# Patient Record
Sex: Female | Born: 1975 | Race: White | Hispanic: No | Marital: Married | Smoking: Former smoker
Health system: Southern US, Community
[De-identification: ages and names within clinical notes are randomized; demographics above are authoritative.]

## PROBLEM LIST (undated history)

## (undated) DIAGNOSIS — E0500 Thyrotoxicosis with diffuse goiter without thyrotoxic crisis or storm: Secondary | ICD-10-CM

## (undated) DIAGNOSIS — E7800 Pure hypercholesterolemia, unspecified: Secondary | ICD-10-CM

## (undated) DIAGNOSIS — E079 Disorder of thyroid, unspecified: Secondary | ICD-10-CM

## (undated) DIAGNOSIS — F329 Major depressive disorder, single episode, unspecified: Secondary | ICD-10-CM

## (undated) DIAGNOSIS — J189 Pneumonia, unspecified organism: Secondary | ICD-10-CM

## (undated) DIAGNOSIS — E559 Vitamin D deficiency, unspecified: Secondary | ICD-10-CM

## (undated) DIAGNOSIS — M1990 Unspecified osteoarthritis, unspecified site: Secondary | ICD-10-CM

## (undated) DIAGNOSIS — F419 Anxiety disorder, unspecified: Secondary | ICD-10-CM

## (undated) DIAGNOSIS — E119 Type 2 diabetes mellitus without complications: Secondary | ICD-10-CM

## (undated) DIAGNOSIS — R519 Headache, unspecified: Secondary | ICD-10-CM

## (undated) DIAGNOSIS — G4730 Sleep apnea, unspecified: Secondary | ICD-10-CM

## (undated) DIAGNOSIS — E039 Hypothyroidism, unspecified: Secondary | ICD-10-CM

## (undated) DIAGNOSIS — D259 Leiomyoma of uterus, unspecified: Secondary | ICD-10-CM

## (undated) DIAGNOSIS — K259 Gastric ulcer, unspecified as acute or chronic, without hemorrhage or perforation: Secondary | ICD-10-CM

## (undated) DIAGNOSIS — K219 Gastro-esophageal reflux disease without esophagitis: Secondary | ICD-10-CM

## (undated) DIAGNOSIS — J45909 Unspecified asthma, uncomplicated: Secondary | ICD-10-CM

## (undated) DIAGNOSIS — F32A Depression, unspecified: Secondary | ICD-10-CM

## (undated) DIAGNOSIS — K5900 Constipation, unspecified: Secondary | ICD-10-CM

## (undated) DIAGNOSIS — K760 Fatty (change of) liver, not elsewhere classified: Secondary | ICD-10-CM

## (undated) DIAGNOSIS — D649 Anemia, unspecified: Secondary | ICD-10-CM

## (undated) DIAGNOSIS — T7840XA Allergy, unspecified, initial encounter: Secondary | ICD-10-CM

## (undated) DIAGNOSIS — K5790 Diverticulosis of intestine, part unspecified, without perforation or abscess without bleeding: Secondary | ICD-10-CM

## (undated) DIAGNOSIS — K648 Other hemorrhoids: Secondary | ICD-10-CM

## (undated) DIAGNOSIS — R9431 Abnormal electrocardiogram [ECG] [EKG]: Secondary | ICD-10-CM

## (undated) HISTORY — DX: Pure hypercholesterolemia, unspecified: E78.00

## (undated) HISTORY — DX: Anxiety disorder, unspecified: F41.9

## (undated) HISTORY — PX: ABDOMINAL HYSTERECTOMY: SUR658

## (undated) HISTORY — PX: TUBAL LIGATION: SHX77

## (undated) HISTORY — DX: Abnormal electrocardiogram (ECG) (EKG): R94.31

## (undated) HISTORY — DX: Vitamin D deficiency, unspecified: E55.9

## (undated) HISTORY — DX: Gastric ulcer, unspecified as acute or chronic, without hemorrhage or perforation: K25.9

## (undated) HISTORY — DX: Allergy, unspecified, initial encounter: T78.40XA

## (undated) HISTORY — PX: ABDOMINAL HYSTERECTOMY: SHX81

## (undated) HISTORY — PX: MOUTH SURGERY: SHX715

## (undated) HISTORY — DX: Unspecified osteoarthritis, unspecified site: M19.90

## (undated) HISTORY — DX: Constipation, unspecified: K59.00

## (undated) HISTORY — PX: DILATION AND CURETTAGE OF UTERUS: SHX78

---

## 2003-03-14 ENCOUNTER — Emergency Department (HOSPITAL_COMMUNITY): Admission: EM | Admit: 2003-03-14 | Discharge: 2003-03-14 | Payer: Self-pay | Admitting: Emergency Medicine

## 2012-04-06 ENCOUNTER — Encounter (HOSPITAL_COMMUNITY): Payer: Self-pay | Admitting: Emergency Medicine

## 2012-04-06 ENCOUNTER — Emergency Department (HOSPITAL_COMMUNITY)
Admission: EM | Admit: 2012-04-06 | Discharge: 2012-04-06 | Disposition: A | Discharge status: Home / Self Care | Payer: BC Managed Care – PPO | Attending: Emergency Medicine | Admitting: Emergency Medicine

## 2012-04-06 ENCOUNTER — Emergency Department (HOSPITAL_COMMUNITY): Payer: BC Managed Care – PPO

## 2012-04-06 DIAGNOSIS — M25579 Pain in unspecified ankle and joints of unspecified foot: Secondary | ICD-10-CM | POA: Insufficient documentation

## 2012-04-06 DIAGNOSIS — M79672 Pain in left foot: Secondary | ICD-10-CM

## 2012-04-06 DIAGNOSIS — F32A Depression, unspecified: Secondary | ICD-10-CM | POA: Insufficient documentation

## 2012-04-06 DIAGNOSIS — F3289 Other specified depressive episodes: Secondary | ICD-10-CM | POA: Insufficient documentation

## 2012-04-06 DIAGNOSIS — F329 Major depressive disorder, single episode, unspecified: Secondary | ICD-10-CM | POA: Insufficient documentation

## 2012-04-06 HISTORY — DX: Depression, unspecified: F32.A

## 2012-04-06 HISTORY — DX: Major depressive disorder, single episode, unspecified: F32.9

## 2012-04-06 MED ORDER — TRAMADOL-ACETAMINOPHEN 37.5-325 MG PO TABS: ORAL_TABLET | ORAL | Status: AC

## 2012-04-06 MED ORDER — IBUPROFEN 400 MG PO TABS
800.0000 mg | ORAL_TABLET | Freq: Once | ORAL | Status: AC
  Administered 2012-04-06: 800 mg via ORAL
  Filled 2012-04-06: qty 2

## 2012-04-06 NOTE — ED Notes (Signed)
Pt states she was walking down the hall at her house when she stepped wrong and felt a snap/pop on the top part of her right foot.  Sensation, motor intact.  Pt states that her foot feels "tight" when she tries to stretch it.  Pt states in area where foot "popped" last night, she noticed some bruising in area.  Pt's foot has not noticeable bruising or swelling.

## 2012-04-06 NOTE — ED Notes (Signed)
Ortho tech paged  

## 2012-04-06 NOTE — Progress Notes (Signed)
Orthopedic Tech Progress Note Patient Details:  Heather Fry 1975/10/07 [id number]  Ortho Devices Type of Ortho Device: Postop boot Ortho Device/Splint Location: (R) LE Ortho Device/Splint Interventions: Application   Jennye Moccasin 04/06/2012, 3:31 PM

## 2012-04-06 NOTE — ED Notes (Signed)
Pt c/o right foot pain after stepping wrong yesterday; CMS intact

## 2012-04-06 NOTE — ED Provider Notes (Signed)
History   This chart was scribed for Ward Givens, MD by Charolett Bumpers . The patient was seen in room TR10C/TR10C.    CSN: [id number]  Arrival date & time 04/06/12  1241   First MD Initiated Contact with Patient 04/06/12 1500      Chief Complaint  Patient presents with  . Foot Pain    (Consider location/radiation/quality/duration/timing/severity/associated sxs/prior treatment) HPI Heather Fry is a 36 y.o. female who presents to the Emergency Department complaining of constant, moderate right foot pain since last night. Patient states that she was walking across floor, when her foot "snapped" and indicates the MTP of her right great toe radiating up the medial aspect of her foot. Patient states that her toes feel "tight". Patient reports that she has heard the joints in her foot "pop" frequently, but never this severe before. Patient states that her symptoms are aggravated with movement and weight bearing. Patient reports a medical h/o depression and early menopause.  PCP: Dr Kristian Covey in [place]  Past Medical History  Diagnosis Date  . Depression     History reviewed. No pertinent past surgical history.  History reviewed. No pertinent family history.  History  Substance Use Topics  . Smoking status: Never Smoker   . Smokeless tobacco: Not on file  . Alcohol Use: No   unemployed homemaker  OB History    Grav Para Term Preterm Abortions TAB SAB Ect Mult Living                  Review of Systems  Musculoskeletal: Positive for arthralgias.    Allergies  Penicillins  Home Medications   Current Outpatient Rx  Name Route Sig Dispense Refill  . TYLENOL ARTHRITIS PAIN PO Oral Take 2 tablets by mouth 2 (two) times daily as needed. For pain    . CETIRIZINE HCL 10 MG PO TABS Oral Take 10 mg by mouth daily.    Marland Kitchen TAGAMET PO Oral Take 1 tablet by mouth 2 (two) times daily as needed. For acid reflux    . ESCITALOPRAM OXALATE 10 MG PO TABS Oral Take 10 mg by  mouth at bedtime.    Marland Kitchen FIBER PO CAPS Oral Take 2 capsules by mouth daily.    . ADULT MULTIVITAMIN W/MINERALS CH Oral Take 1 tablet by mouth daily.      BP 134/88  Pulse 83  Temp 99.7 F (37.6 C) (Oral)  Resp 14  SpO2 100%  LMP 03/16/2012  Vital signs normal    Physical Exam  Nursing note and vitals reviewed. Constitutional: She is oriented to person, place, and time. She appears well-developed and well-nourished. No distress.  HENT:  Head: Normocephalic and atraumatic.  Right Ear: External ear normal.  Left Ear: External ear normal.  Eyes: Conjunctivae and EOM are normal. Pupils are equal, round, and reactive to light.  Neck: Normal range of motion. Neck supple. No tracheal deviation present.  Cardiovascular:       Intact distal pulses.   Pulmonary/Chest: Effort normal. No respiratory distress.  Musculoskeletal: Normal range of motion.       No swelling of right foot. Diffuse tenderness without localization of pain. Good distal pulses. Neurovascularly intact.   Neurological: She is alert and oriented to person, place, and time.  Skin: Skin is warm and dry.       Good skin color.   Psychiatric: She has a normal mood and affect. Her behavior is normal.    ED Course  Procedures (  including critical care time)   Medications  ibuprofen (ADVIL,MOTRIN) tablet 800 mg (not administered)   Pt relates she has a lot of pain/swelling in her left knee and has shin splints. She lives in [place], given referral to [facility] in [place].  Pt placed in post-op shoe  DIAGNOSTIC STUDIES: Oxygen Saturation is 100% on room air, normal by my interpretation.    COORDINATION OF CARE:   1508: Discussed planned course of treatment with the patient, who is agreeable at this time. Will place patient in post op boot and discussed elevation of foot and ice.    Labs Reviewed - No data to display Dg Foot Complete Right  04/06/2012  *RADIOLOGY REPORT*  Clinical Data: Fall, metatarsal pain  RIGHT FOOT  COMPLETE - 3+ VIEW  Comparison: None.  Findings: Normal alignment without fracture.  Preserved joint spaces.  No significant arthropathy.  No radiographic swelling or foreign body.  IMPRESSION: No acute finding.  Original Report Authenticated By: Judie Petit. TREVOR Miles Costain, M.D.     1. Foot pain, left    New Prescriptions   TRAMADOL-ACETAMINOPHEN (ULTRACET) 37.5-325 MG PER TABLET    2 tabs po QID prn pain  Ibuprofen   Plan discharge  Devoria Albe, MD, FACEP    MDM     I personally performed the services described in this documentation, which was scribed in my presence. The recorded information has been reviewed and considered.  Devoria Albe, MD, Armando Gang       Ward Givens, MD 04/06/12 1534

## 2012-12-30 ENCOUNTER — Encounter: Payer: Self-pay | Admitting: Obstetrics & Gynecology

## 2013-01-13 ENCOUNTER — Ambulatory Visit (INDEPENDENT_AMBULATORY_CARE_PROVIDER_SITE_OTHER): Payer: BC Managed Care – PPO | Admitting: Obstetrics & Gynecology

## 2013-01-13 ENCOUNTER — Encounter: Payer: Self-pay | Admitting: Obstetrics & Gynecology

## 2013-01-13 VITALS — BP 130/90 | Ht 64.0 in | Wt 217.0 lb

## 2013-01-13 DIAGNOSIS — N920 Excessive and frequent menstruation with regular cycle: Secondary | ICD-10-CM | POA: Insufficient documentation

## 2013-01-13 DIAGNOSIS — N946 Dysmenorrhea, unspecified: Secondary | ICD-10-CM

## 2013-01-19 NOTE — Progress Notes (Signed)
Patient ID: Heather Fry, female   DOB: Sep 07, 1976, 37 y.o.   MRN: [id number] Patient presents with 2-3 year history of menstrual complaints, heavier crampier periods. Also with significant complaints of anxiety, OCD and emotional lability. Husband verifies.  By history not associated with menstrual cycle. On lexapro and wondering if it is the right way to go  I told patient she needs to stay on the lexapro and recommend evaluation and treatment by a psychiatrist for mood lability and OCD  Pt and husband agree and I will investigate available resources.

## 2013-01-20 ENCOUNTER — Telehealth: Payer: Self-pay | Admitting: Obstetrics & Gynecology

## 2013-01-20 ENCOUNTER — Telehealth: Payer: Self-pay | Admitting: *Deleted

## 2013-01-20 NOTE — Telephone Encounter (Signed)
Front staff to call and make an appt for the patient to be seen by provider.

## 2013-02-28 NOTE — Telephone Encounter (Signed)
See telephone message

## 2013-10-14 ENCOUNTER — Other Ambulatory Visit (HOSPITAL_COMMUNITY): Payer: Self-pay | Admitting: Family Medicine

## 2013-10-14 ENCOUNTER — Ambulatory Visit (HOSPITAL_COMMUNITY)
Admission: RE | Admit: 2013-10-14 | Discharge: 2013-10-14 | Disposition: A | Discharge status: Home / Self Care | Payer: 59 | Source: Ambulatory Visit | Attending: Family Medicine | Admitting: Family Medicine

## 2013-10-14 DIAGNOSIS — R079 Chest pain, unspecified: Secondary | ICD-10-CM

## 2013-10-14 DIAGNOSIS — M25519 Pain in unspecified shoulder: Secondary | ICD-10-CM | POA: Insufficient documentation

## 2013-10-14 IMAGING — CR DG CHEST 2V
2 series · 2 of 2 positions shown · non-contrast
Comparison: [DATE]

CLINICAL DATA: Left chest and shoulder pain

EXAM:
CHEST  2 VIEW

[view not recorded (1 of 2)]
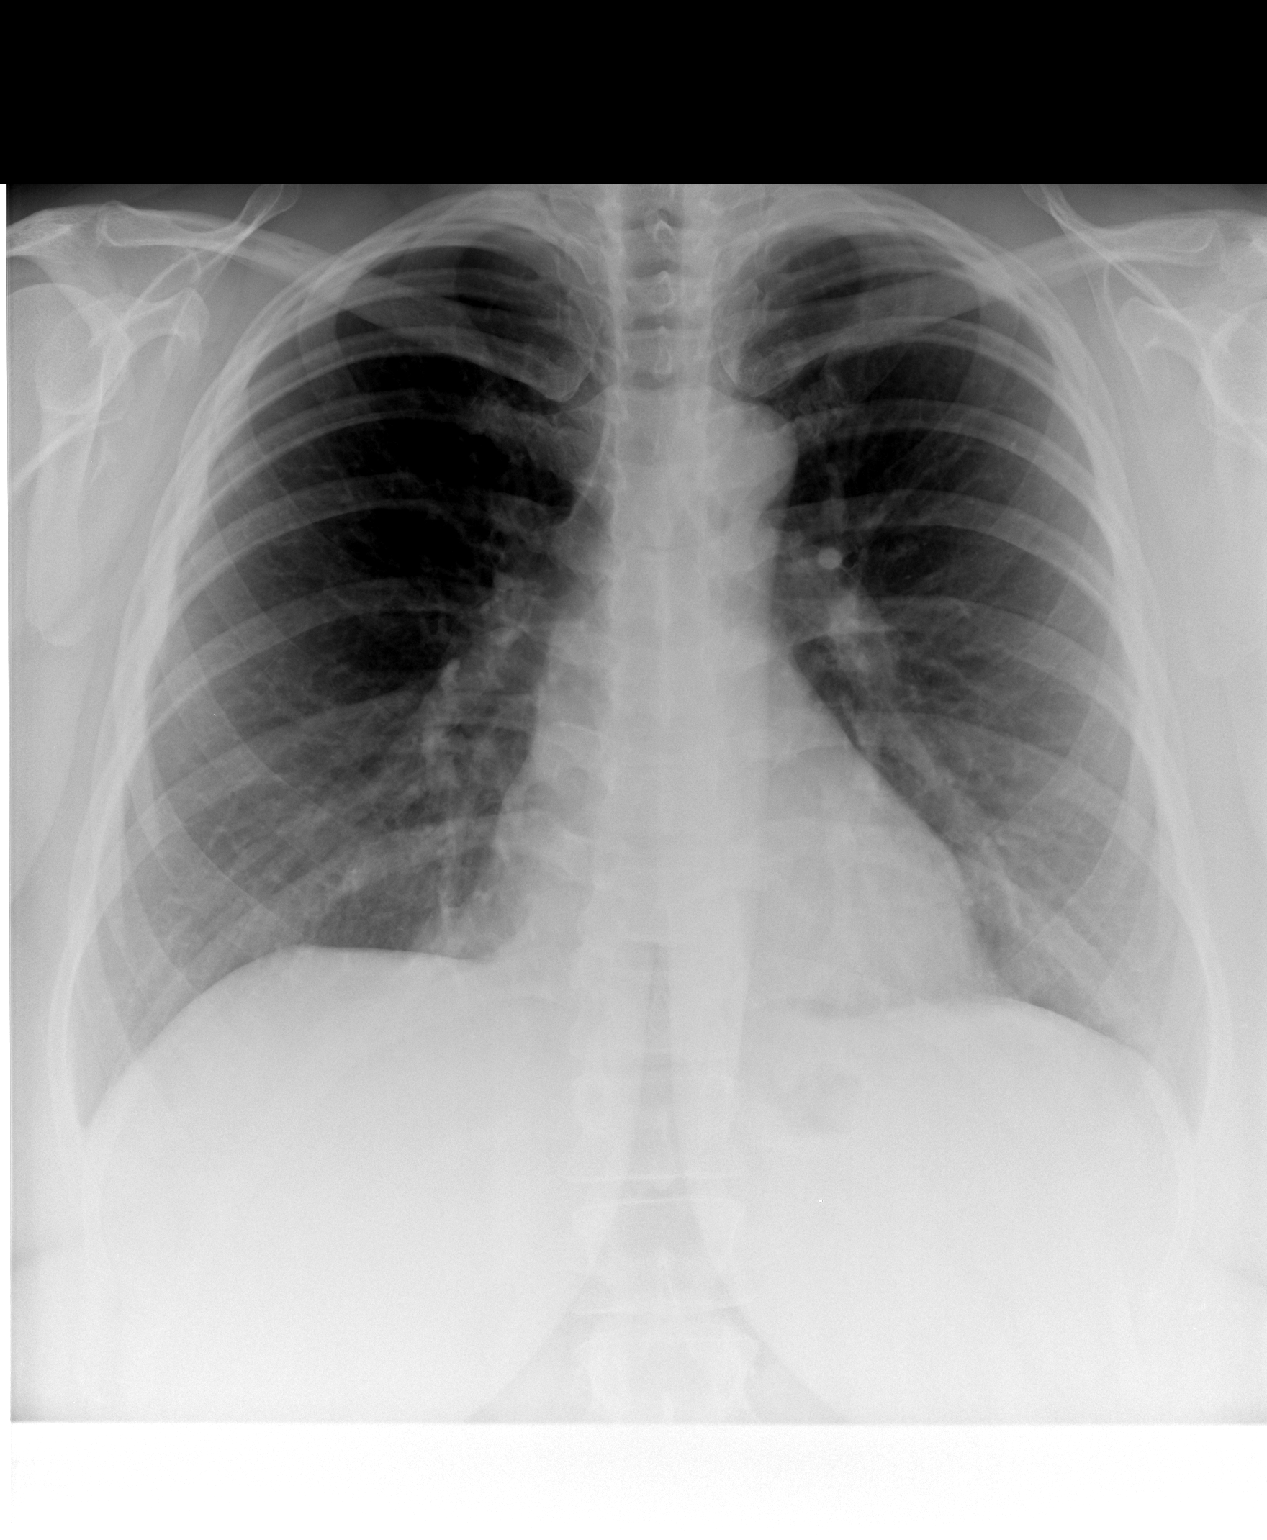

[view not recorded (2 of 2)]
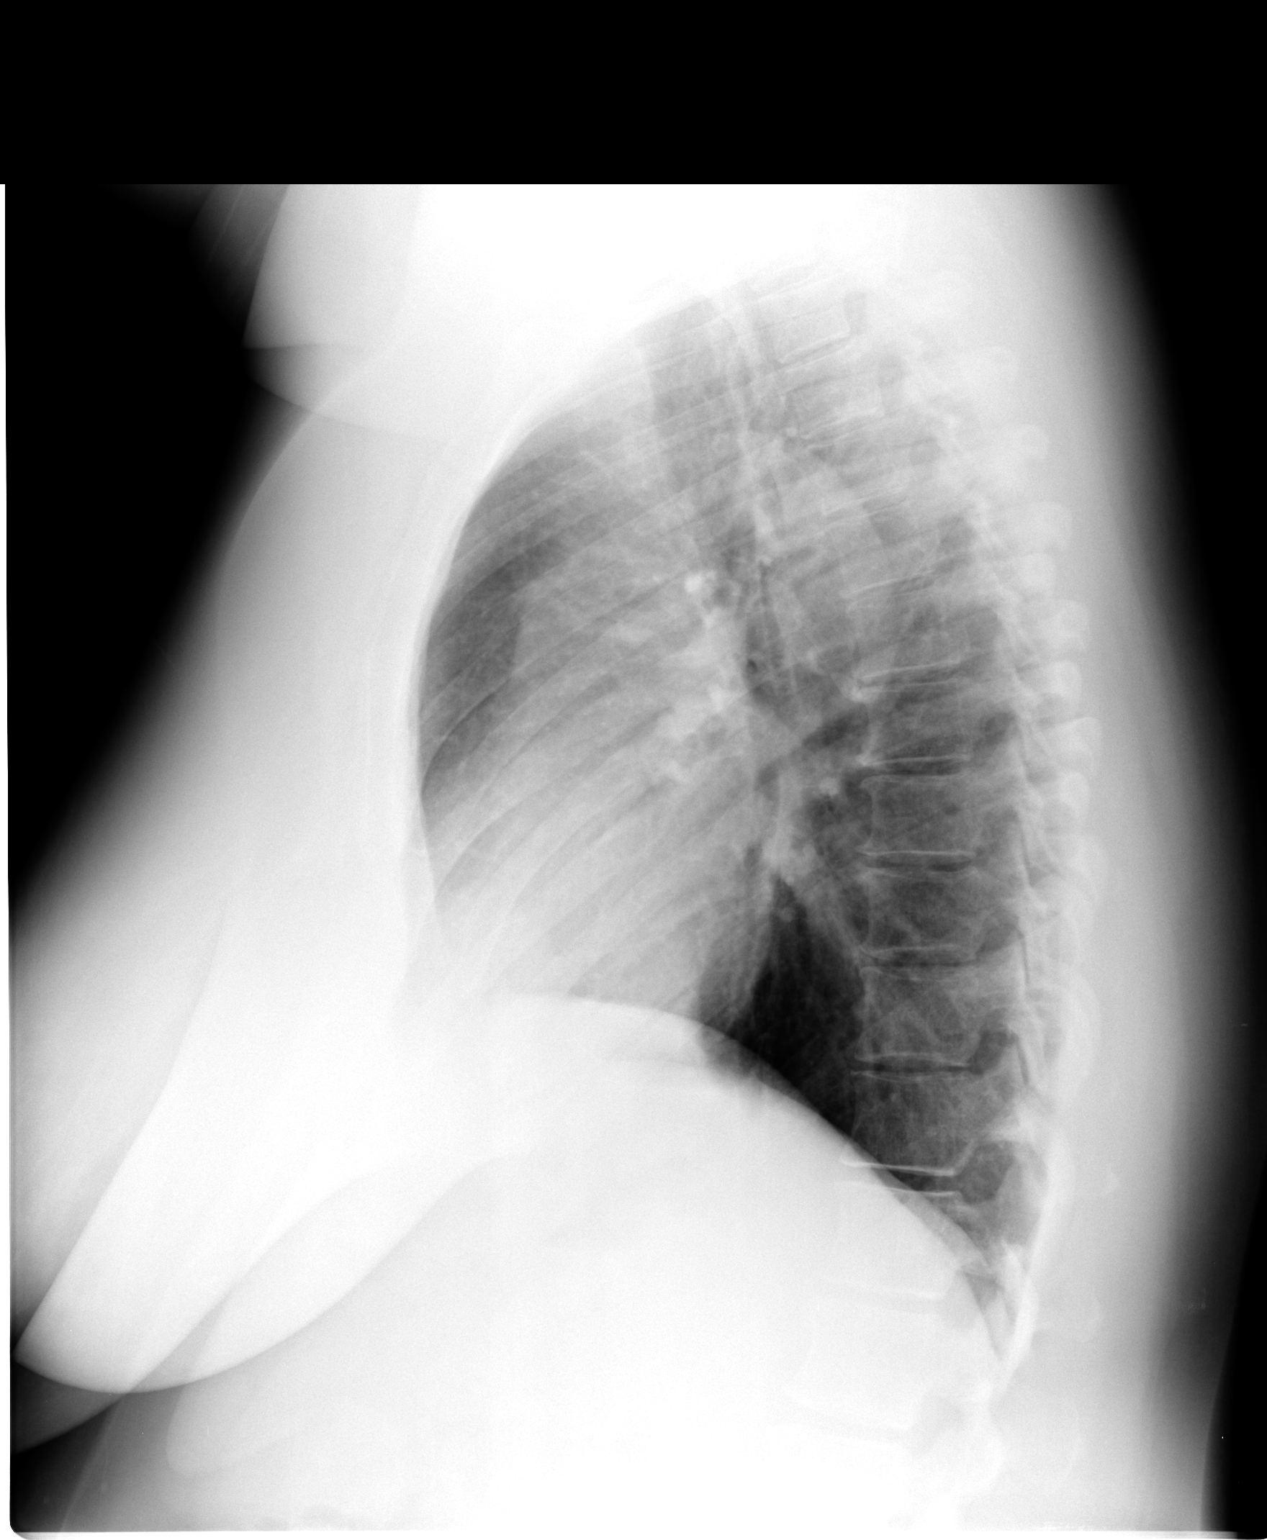

[2 of 2 positions shown; findings below may reference images not displayed]

FINDINGS: Normal heart size, mediastinal contours, and pulmonary vascularity.

Lungs clear.

Bones unremarkable.

No pneumothorax.
IMPRESSION: Normal exam.

## 2014-08-10 ENCOUNTER — Emergency Department (HOSPITAL_COMMUNITY)
Admission: EM | Admit: 2014-08-10 | Discharge: 2014-08-10 | Disposition: A | Discharge status: Home / Self Care | Payer: 59 | Attending: Emergency Medicine | Admitting: Emergency Medicine

## 2014-08-10 ENCOUNTER — Encounter (HOSPITAL_COMMUNITY): Payer: Self-pay | Admitting: *Deleted

## 2014-08-10 DIAGNOSIS — Z8742 Personal history of other diseases of the female genital tract: Secondary | ICD-10-CM | POA: Insufficient documentation

## 2014-08-10 DIAGNOSIS — Z79899 Other long term (current) drug therapy: Secondary | ICD-10-CM | POA: Insufficient documentation

## 2014-08-10 DIAGNOSIS — Z3202 Encounter for pregnancy test, result negative: Secondary | ICD-10-CM | POA: Insufficient documentation

## 2014-08-10 DIAGNOSIS — Z791 Long term (current) use of non-steroidal anti-inflammatories (NSAID): Secondary | ICD-10-CM | POA: Diagnosis not present

## 2014-08-10 DIAGNOSIS — N390 Urinary tract infection, site not specified: Secondary | ICD-10-CM | POA: Diagnosis not present

## 2014-08-10 DIAGNOSIS — K219 Gastro-esophageal reflux disease without esophagitis: Secondary | ICD-10-CM | POA: Insufficient documentation

## 2014-08-10 DIAGNOSIS — Z880 Allergy status to penicillin: Secondary | ICD-10-CM | POA: Diagnosis not present

## 2014-08-10 DIAGNOSIS — R1011 Right upper quadrant pain: Secondary | ICD-10-CM | POA: Diagnosis present

## 2014-08-10 DIAGNOSIS — F329 Major depressive disorder, single episode, unspecified: Secondary | ICD-10-CM | POA: Insufficient documentation

## 2014-08-10 DIAGNOSIS — F419 Anxiety disorder, unspecified: Secondary | ICD-10-CM | POA: Insufficient documentation

## 2014-08-10 HISTORY — DX: Leiomyoma of uterus, unspecified: D25.9

## 2014-08-10 LAB — PREGNANCY, URINE: Preg Test, Ur: NEGATIVE

## 2014-08-10 LAB — COMPREHENSIVE METABOLIC PANEL
ALK PHOS: 51 U/L (ref 39–117)
ALT: 16 U/L (ref 0–35)
ANION GAP: 11 (ref 5–15)
AST: 15 U/L (ref 0–37)
Albumin: 3.7 g/dL (ref 3.5–5.2)
BUN: 10 mg/dL (ref 6–23)
CALCIUM: 9.9 mg/dL (ref 8.4–10.5)
CO2: 26 meq/L (ref 19–32)
Chloride: 100 mEq/L (ref 96–112)
Creatinine, Ser: 0.63 mg/dL (ref 0.50–1.10)
GLUCOSE: 87 mg/dL (ref 70–99)
POTASSIUM: 4.4 meq/L (ref 3.7–5.3)
SODIUM: 137 meq/L (ref 137–147)
Total Bilirubin: <0.2 mg/dL — ABNORMAL LOW (ref 0.3–1.2)
Total Protein: 7.4 g/dL (ref 6.0–8.3)

## 2014-08-10 LAB — URINALYSIS, ROUTINE W REFLEX MICROSCOPIC
Bilirubin Urine: NEGATIVE
Glucose, UA: NEGATIVE mg/dL
Hgb urine dipstick: NEGATIVE
KETONES UR: NEGATIVE mg/dL
NITRITE: NEGATIVE
PROTEIN: NEGATIVE mg/dL
Specific Gravity, Urine: 1.02 (ref 1.005–1.030)
UROBILINOGEN UA: 0.2 mg/dL (ref 0.0–1.0)
pH: 7 (ref 5.0–8.0)

## 2014-08-10 LAB — URINE MICROSCOPIC-ADD ON

## 2014-08-10 LAB — CBC WITH DIFFERENTIAL/PLATELET
BASOS ABS: 0 10*3/uL (ref 0.0–0.1)
BASOS PCT: 0 % (ref 0–1)
EOS ABS: 0.1 10*3/uL (ref 0.0–0.7)
EOS PCT: 1 % (ref 0–5)
HCT: 39.3 % (ref 36.0–46.0)
Hemoglobin: 13.4 g/dL (ref 12.0–15.0)
Lymphocytes Relative: 24 % (ref 12–46)
Lymphs Abs: 2.2 10*3/uL (ref 0.7–4.0)
MCH: 30 pg (ref 26.0–34.0)
MCHC: 34.1 g/dL (ref 30.0–36.0)
MCV: 87.9 fL (ref 78.0–100.0)
Monocytes Absolute: 0.6 10*3/uL (ref 0.1–1.0)
Monocytes Relative: 6 % (ref 3–12)
NEUTROS PCT: 69 % (ref 43–77)
Neutro Abs: 6.5 10*3/uL (ref 1.7–7.7)
PLATELETS: 259 10*3/uL (ref 150–400)
RBC: 4.47 MIL/uL (ref 3.87–5.11)
RDW: 13.2 % (ref 11.5–15.5)
WBC: 9.3 10*3/uL (ref 4.0–10.5)

## 2014-08-10 LAB — LIPASE, BLOOD: Lipase: 54 U/L (ref 11–59)

## 2014-08-10 MED ORDER — PANTOPRAZOLE SODIUM 20 MG PO TBEC: 20.0000 mg | DELAYED_RELEASE_TABLET | Freq: Every day | ORAL | Status: DC

## 2014-08-10 MED ORDER — NITROFURANTOIN MONOHYD MACRO 100 MG PO CAPS: 100.0000 mg | ORAL_CAPSULE | Freq: Two times a day (BID) | ORAL | Status: DC

## 2014-08-10 MED ORDER — GI COCKTAIL ~~LOC~~
30.0000 mL | Freq: Once | ORAL | Status: AC
  Administered 2014-08-10: 30 mL via ORAL
  Filled 2014-08-10: qty 30

## 2014-08-10 NOTE — Discharge Instructions (Signed)
Gastroesophageal Reflux Disease, Adult Gastroesophageal reflux disease (GERD) happens when acid from your stomach flows up into the esophagus. When acid comes in contact with the esophagus, the acid causes soreness (inflammation) in the esophagus. Over time, GERD may create small holes (ulcers) in the lining of the esophagus. CAUSES   Increased body weight. This puts pressure on the stomach, making acid rise from the stomach into the esophagus.  Smoking. This increases acid production in the stomach.  Drinking alcohol. This causes decreased pressure in the lower esophageal sphincter (valve or ring of muscle between the esophagus and stomach), allowing acid from the stomach into the esophagus.  Late evening meals and a full stomach. This increases pressure and acid production in the stomach.  A malformed lower esophageal sphincter. Sometimes, no cause is found. SYMPTOMS   Burning pain in the lower part of the mid-chest behind the breastbone and in the mid-stomach area. This may occur twice a week or more often.  Trouble swallowing.  Sore throat.  Dry cough.  Asthma-like symptoms including chest tightness, shortness of breath, or wheezing. DIAGNOSIS  Your caregiver may be able to diagnose GERD based on your symptoms. In some cases, X-rays and other tests may be done to check for complications or to check the condition of your stomach and esophagus. TREATMENT  Your caregiver may recommend over-the-counter or prescription medicines to help decrease acid production. Ask your caregiver before starting or adding any new medicines.  HOME CARE INSTRUCTIONS   Change the factors that you can control. Ask your caregiver for guidance concerning weight loss, quitting smoking, and alcohol consumption.  Avoid foods and drinks that make your symptoms worse, such as:  Caffeine or alcoholic drinks.  Chocolate.  Peppermint or mint flavorings.  Garlic and onions.  Spicy foods.  Citrus fruits,  such as oranges, lemons, or limes.  Tomato-based foods such as sauce, chili, salsa, and pizza.  Fried and fatty foods.  Avoid lying down for the 3 hours prior to your bedtime or prior to taking a nap.  Eat small, frequent meals instead of large meals.  Wear loose-fitting clothing. Do not wear anything tight around your waist that causes pressure on your stomach.  Raise the head of your bed 6 to 8 inches with wood blocks to help you sleep. Extra pillows will not help.  Only take over-the-counter or prescription medicines for pain, discomfort, or fever as directed by your caregiver.  Do not take aspirin, ibuprofen, or other nonsteroidal anti-inflammatory drugs (NSAIDs). SEEK IMMEDIATE MEDICAL CARE IF:   You have pain in your arms, neck, jaw, teeth, or back.  Your pain increases or changes in intensity or duration.  You develop nausea, vomiting, or sweating (diaphoresis).  You develop shortness of breath, or you faint.  Your vomit is green, yellow, black, or looks like coffee grounds or blood.  Your stool is red, bloody, or black. These symptoms could be signs of other problems, such as heart disease, gastric bleeding, or esophageal bleeding. MAKE SURE YOU:   Understand these instructions.  Will watch your condition.  Will get help right away if you are not doing well or get worse. Document Released: 06/25/2005 Document Revised: 12/08/2011 Document Reviewed: 04/04/2011 [facility] Patient Information 2015 [place], [place]. This information is not intended to replace advice given to you by your health care provider. Make sure you discuss any questions you have with your health care provider.  Urinary Tract Infection A urinary tract infection (UTI) can occur any place along the urinary  tract. The tract includes the kidneys, ureters, bladder, and urethra. A type of germ called bacteria often causes a UTI. UTIs are often helped with antibiotic medicine.  HOME CARE   If given, take  antibiotics as told by your doctor. Finish them even if you start to feel better.  Drink enough fluids to keep your pee (urine) clear or pale yellow.  Avoid tea, drinks with caffeine, and bubbly (carbonated) drinks.  Pee often. Avoid holding your pee in for a long time.  Pee before and after having sex (intercourse).  Wipe from front to back after you poop (bowel movement) if you are a woman. Use each tissue only once. GET HELP RIGHT AWAY IF:   You have back pain.  You have lower belly (abdominal) pain.  You have chills.  You feel sick to your stomach (nauseous).  You throw up (vomit).  Your burning or discomfort with peeing does not go away.  You have a fever.  Your symptoms are not better in 3 days. MAKE SURE YOU:   Understand these instructions.  Will watch your condition.  Will get help right away if you are not doing well or get worse. Document Released: 03/03/2008 Document Revised: 06/09/2012 Document Reviewed: 04/15/2012 [facility] Patient Information 2015 [place], [place]. This information is not intended to replace advice given to you by your health care provider. Make sure you discuss any questions you have with your health care provider.

## 2014-08-10 NOTE — ED Provider Notes (Signed)
CSN: [id number]     Arrival date & time 08/10/14  1053 History   First MD Initiated Contact with Patient 08/10/14 1102     Chief Complaint  Patient presents with  . Abdominal Pain     (Consider location/radiation/quality/duration/timing/severity/associated sxs/prior Treatment) HPI Comments: Pt comes in today with right upper abdominal paint times 3 days. Denies fever,vomiting, diarrhea. Pt states that does have a history of uterine fibroid but this doesn't feel consistent with that pain. Pt states that the she is having burning in her upper abdomen. Nothing makes the pain better worse. Pt states that she is supposed to see gi in a couple of months  The history is provided by the patient. No language interpreter was used.    Past Medical History  Diagnosis Date  . Depression   . Anxiety   . Uterine fibroid    History reviewed. No pertinent past surgical history. Family History  Problem Relation Age of Onset  . Hypertension Father   . Depression Other    History  Substance Use Topics  . Smoking status: Never Smoker   . Smokeless tobacco: Not on file  . Alcohol Use: No   OB History    No data available     Review of Systems  All other systems reviewed and are negative.     Allergies  Penicillins  Home Medications   Prior to Admission medications   Medication Sig Start Date End Date Taking? Authorizing Provider  Acetaminophen (TYLENOL ARTHRITIS PAIN PO) Take 2 tablets by mouth 2 (two) times daily as needed. For pain   Yes Historical Provider, MD  cetirizine (ZYRTEC) 10 MG tablet Take 10 mg by mouth daily.   Yes Historical Provider, MD  escitalopram (LEXAPRO) 20 MG tablet Take 20 mg by mouth daily.   Yes Historical Provider, MD  Fiber CAPS Take 2 capsules by mouth daily.   Yes Historical Provider, MD  Multiple Vitamin (MULTIVITAMIN WITH MINERALS) TABS Take 1 tablet by mouth daily.   Yes Historical Provider, MD  Cimetidine (TAGAMET PO) Take 1 tablet by mouth 2 (two)  times daily as needed. For acid reflux    Historical Provider, MD  escitalopram (LEXAPRO) 10 MG tablet Take 10 mg by mouth at bedtime.    Historical Provider, MD  naproxen sodium (ANAPROX) 220 MG tablet Take 220 mg by mouth 2 (two) times daily with a meal.    Historical Provider, MD  nitrofurantoin, macrocrystal-monohydrate, (MACROBID) 100 MG capsule Take 1 capsule (100 mg total) by mouth 2 (two) times daily. 08/10/14   Glendell Docker, NP  pantoprazole (PROTONIX) 20 MG tablet Take 1 tablet (20 mg total) by mouth daily. 08/10/14   Glendell Docker, NP   BP 141/87 mmHg  Pulse 91  Temp(Src) 98.3 F (36.8 C) (Oral)  Resp 17  Ht 5\' 4"  (1.626 m)  Wt 230 lb (104.327 kg)  BMI 39.46 kg/m2  SpO2 99%  LMP 07/22/2014 Physical Exam  Constitutional: She is oriented to person, place, and time. She appears well-developed and well-nourished.  HENT:  Right Ear: External ear normal.  Left Ear: External ear normal.  Eyes: Conjunctivae and EOM are normal. Pupils are equal, round, and reactive to light.  Cardiovascular: Normal rate and regular rhythm.   Pulmonary/Chest: Effort normal and breath sounds normal.  Abdominal: Soft. Bowel sounds are normal. There is no tenderness.  Musculoskeletal: Normal range of motion.  Neurological: She is alert and oriented to person, place, and time.  Skin: Skin is warm and  dry.  Nursing note and vitals reviewed.   ED Course  Procedures (including critical care time) Labs Review Labs Reviewed  COMPREHENSIVE METABOLIC PANEL - Abnormal; Notable for the following:    Total Bilirubin <0.2 (*)    All other components within normal limits  URINALYSIS, ROUTINE W REFLEX MICROSCOPIC - Abnormal; Notable for the following:    Leukocytes, UA LARGE (*)    All other components within normal limits  URINE MICROSCOPIC-ADD ON - Abnormal; Notable for the following:    Squamous Epithelial / LPF MANY (*)    Bacteria, UA MANY (*)    All other components within normal limits   URINE CULTURE  LIPASE, BLOOD  CBC WITH DIFFERENTIAL  PREGNANCY, URINE    Imaging Review No results found.   EKG Interpretation None      MDM   Final diagnoses:  UTI (lower urinary tract infection)  Gastroesophageal reflux disease, esophagitis presence not specified    Pts abdomen is benign. She if feeling better after the gi cocktail. Don't think imaging is needed at this time. Will send home on protonix and macrobid. Pt is okay with plan    Glendell Docker, NP 08/10/14 [place], MD 08/10/14 1505

## 2014-08-10 NOTE — ED Notes (Signed)
Pt alert & oriented x4, stable gait. Patient given discharge instructions, paperwork & prescription(s). Patient  instructed to stop at the registration desk to finish any additional paperwork. Patient verbalized understanding. Pt left department w/ no further questions. 

## 2014-08-10 NOTE — ED Notes (Signed)
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 Pt unable to see her PCP this week.

## 2014-08-11 LAB — URINE CULTURE
COLONY COUNT: NO GROWTH
CULTURE: NO GROWTH

## 2014-09-06 ENCOUNTER — Ambulatory Visit (INDEPENDENT_AMBULATORY_CARE_PROVIDER_SITE_OTHER): Payer: 59 | Admitting: Gastroenterology

## 2014-09-06 ENCOUNTER — Encounter: Payer: Self-pay | Admitting: Gastroenterology

## 2014-09-06 VITALS — BP 137/92 | HR 92 | Temp 98.8°F | Ht 64.0 in | Wt 236.4 lb

## 2014-09-06 DIAGNOSIS — R1011 Right upper quadrant pain: Secondary | ICD-10-CM | POA: Insufficient documentation

## 2014-09-06 MED ORDER — PANTOPRAZOLE SODIUM 40 MG PO TBEC: 40.0000 mg | DELAYED_RELEASE_TABLET | Freq: Every day | ORAL | Status: DC

## 2014-09-06 NOTE — Progress Notes (Signed)
cc'ed to pcp °

## 2014-09-06 NOTE — Patient Instructions (Signed)
We have set you up for an ultrasound of your abdomen to check your gallbladder.   I have increased Protonix to 40 mg each day, 30 minutes before breakfast.   Please call in 7-10 days with a report of how you are doing. We may need to do an upper endoscopy if you have persistent symptoms or the ultrasound is inconclusive.

## 2014-09-06 NOTE — Progress Notes (Addendum)
Primary Care Physician:  Rowan Blase, PA Primary Gastroenterologist:  Dr. Gala Romney  Chief Complaint  Patient presents with  . Abdominal Pain  . Rectal Pain    HPI:   Heather Fry presents today as a self-referral secondary to RUQ pain. Occasional radiation across upper abdomen but mainly RUQ. Symptom onset in Nov 2015, with worsening of symptoms. Associated severe reflux. No relief with OTC agents. Provided Protonix by the ED with significant improvent. GERD symptoms with improvement/resolution. Still with occasional RUQ pain, no associated/aggravating factors. No diarrhea or constipation. Gallbladder remains in situ. Naproxen for bursitis just as needed, not often. Switched to tylenol arthritis.   Past Medical History  Diagnosis Date  . Depression   . Anxiety   . Uterine fibroid     Past Surgical History  Procedure Laterality Date  . Dilation and curettage of uterus    . Tubal ligation    . Mouth surgery      Current Outpatient Prescriptions  Medication Sig Dispense Refill  . Acetaminophen (TYLENOL ARTHRITIS PAIN PO) Take 2 tablets by mouth 2 (two) times daily as needed. For pain    . escitalopram (LEXAPRO) 20 MG tablet Take 20 mg by mouth at bedtime.     . Fiber CAPS Take 2 capsules by mouth daily.    . Multiple Vitamin (MULTIVITAMIN WITH MINERALS) TABS Take 1 tablet by mouth daily.    . pseudoephedrine-acetaminophen (TYLENOL SINUS) 30-500 MG TABS Take 1 tablet by mouth every 4 (four) hours as needed (as needed).    Marland Kitchen escitalopram (LEXAPRO) 10 MG tablet Take 10 mg by mouth at bedtime.    . pantoprazole (PROTONIX) 40 MG tablet Take 1 tablet (40 mg total) by mouth daily. 30 tablet 3   No current facility-administered medications for this visit.    Allergies as of 09/06/2014 - Review Complete 08/10/2014  Allergen Reaction Noted  . Penicillins Other (See Comments) 04/06/2012    Family History  Problem Relation Age of Onset  . Hypertension Father   . Depression  Other   . Colon cancer Neg Hx   . Uterine cancer Mother   . Uterine cancer Sister     History   Social History  . Marital Status: Married    Spouse Name: N/A    Number of Children: N/A  . Years of Education: N/A   Occupational History  . Not on file.   Social History Main Topics  . Smoking status: Never Smoker   . Smokeless tobacco: Not on file  . Alcohol Use: No  . Drug Use: No  . Sexual Activity: Yes   Other Topics Concern  . Not on file   Social History Narrative    Review of Systems: As mentioned in HPI.   Physical Exam: BP 137/92 mmHg  Pulse 92  Temp(Src) 98.8 F (37.1 C)  Ht 5\' 4"  (1.626 m)  Wt 236 lb 6.4 oz (107.23 kg)  BMI 40.56 kg/m2  LMP 08/11/2014 General:   Alert and oriented. Pleasant and cooperative. Well-nourished and well-developed.  Head:  Normocephalic and atraumatic. Eyes:  Without icterus, sclera clear and conjunctiva pink.  Ears:  Normal auditory acuity. Nose:  No deformity, discharge,  or lesions. Mouth:  No deformity or lesions, oral mucosa pink.  Lungs:  Clear to auscultation bilaterally. No wheezes, rales, or rhonchi. No distress.  Heart:  S1, S2 present without murmurs appreciated.  Abdomen:  +BS, soft, non-tender and non-distended. No HSM noted. No guarding or rebound.  No masses appreciated. Umbilical hernia noted.  Rectal:  Deferred  Msk:  Symmetrical without gross deformities. Normal posture. Extremities:  Without clubbing or edema. Neurologic:  Alert and  oriented x4;  grossly normal neurologically. Skin:  Intact without significant lesions or rashes.. Psych:  Alert and cooperative. Normal mood and affect.   Lab Results  Component Value Date   WBC 9.3 08/10/2014   HGB 13.4 08/10/2014   HCT 39.3 08/10/2014   MCV 87.9 08/10/2014   PLT 259 08/10/2014   Lab Results  Component Value Date   ALT 16 08/10/2014   AST 15 08/10/2014   ALKPHOS 51 08/10/2014   BILITOT <0.2* 08/10/2014   Lab Results  Component Value Date    LIPASE 54 08/10/2014

## 2014-09-06 NOTE — Assessment & Plan Note (Signed)
38 year old female with acute onset of RUQ and associated severe GERD symptoms in Nov 2015, with notable improvement with addition of Protonix 20 mg daily. No concerning signs such as melena, weight loss, dysphagia. Still with occasional RUQ discomfort but much improved. GERD symptoms resolved. Differentials including biliary etiology, gastritis, PUD. Labs overall unimpressive. Will proceed with increasing Protonix to 40 mg daily, [place] of abdomen, and progress report in 7-10 days. Consider EGD if no significant improvement with Protonix 40 mg and/or if US abdomen is inconclusive.

## 2014-09-11 ENCOUNTER — Ambulatory Visit (HOSPITAL_COMMUNITY)
Admission: RE | Admit: 2014-09-11 | Discharge: 2014-09-11 | Disposition: A | Discharge status: Home / Self Care | Payer: 59 | Source: Ambulatory Visit | Attending: Gastroenterology | Admitting: Gastroenterology

## 2014-09-11 DIAGNOSIS — K824 Cholesterolosis of gallbladder: Secondary | ICD-10-CM | POA: Diagnosis present

## 2014-09-11 DIAGNOSIS — N281 Cyst of kidney, acquired: Secondary | ICD-10-CM | POA: Insufficient documentation

## 2014-09-11 DIAGNOSIS — R1011 Right upper quadrant pain: Secondary | ICD-10-CM | POA: Diagnosis present

## 2014-09-11 IMAGING — US US ABDOMEN LIMITED
1 series · 14 of 25 positions shown · non-contrast
Comparison: [DATE]

CLINICAL DATA: Right upper quadrant pain, assess for cholelithiasis

EXAM:
US ABDOMEN LIMITED - RIGHT UPPER QUADRANT

[Series 1: us abdomen limited · 0.23mm/px · 14 of 60 slices shown]
[im 1/60]
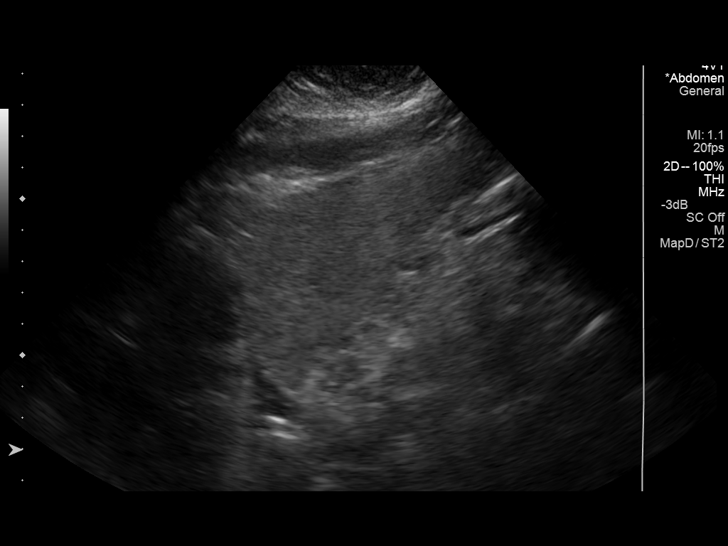
[im 5/60]
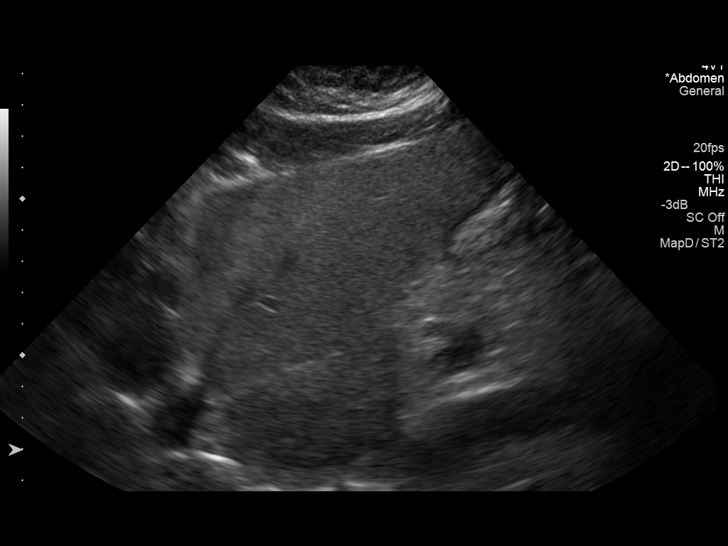
[im 10/60]
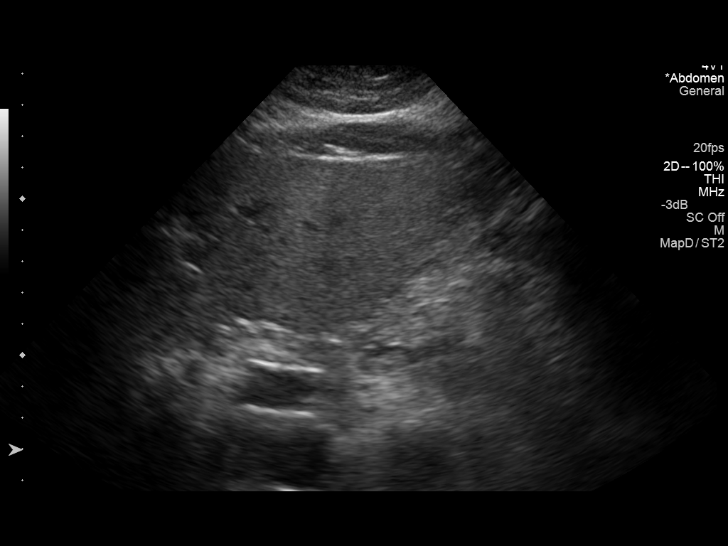
[im 15/60]
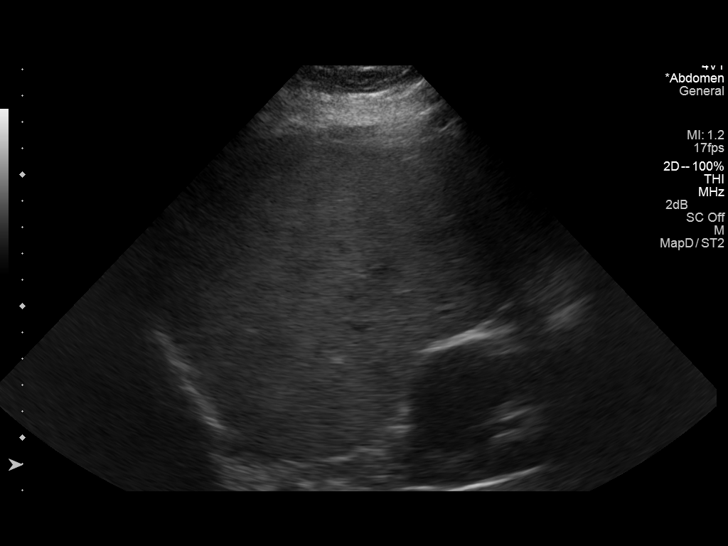
[im 20/60]
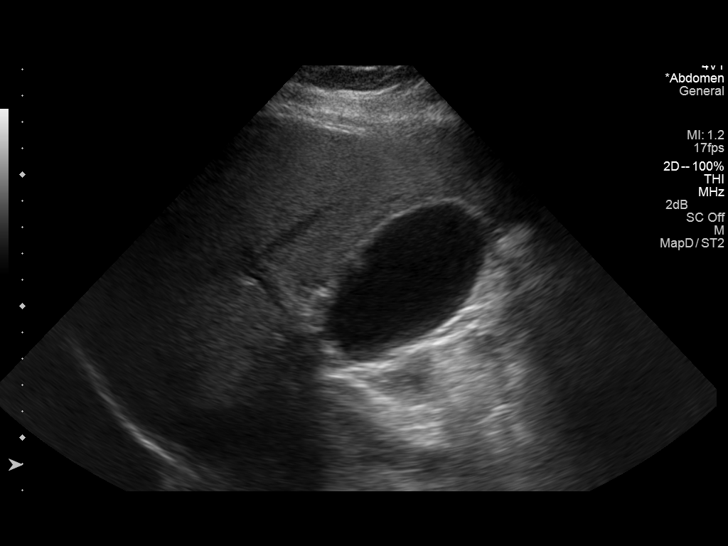
[im 23/60]
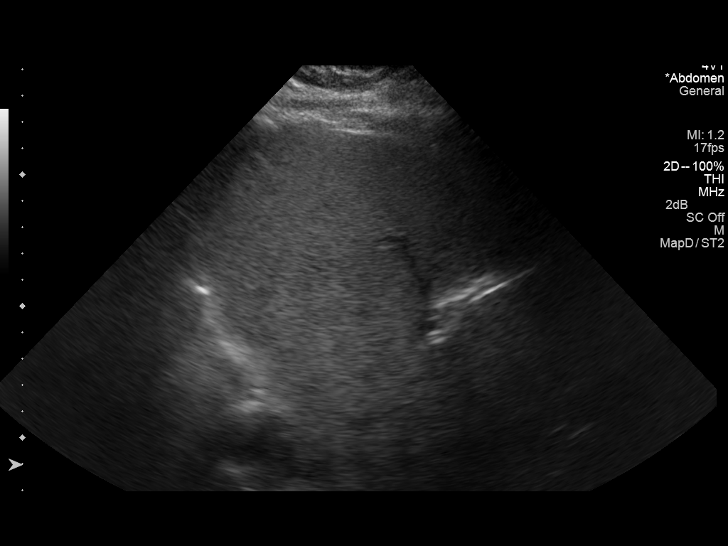
[im 28/60]
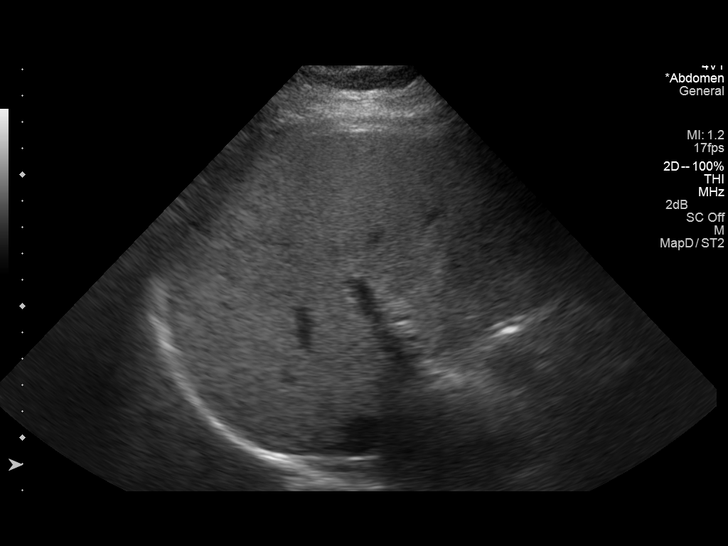
[im 32/60]
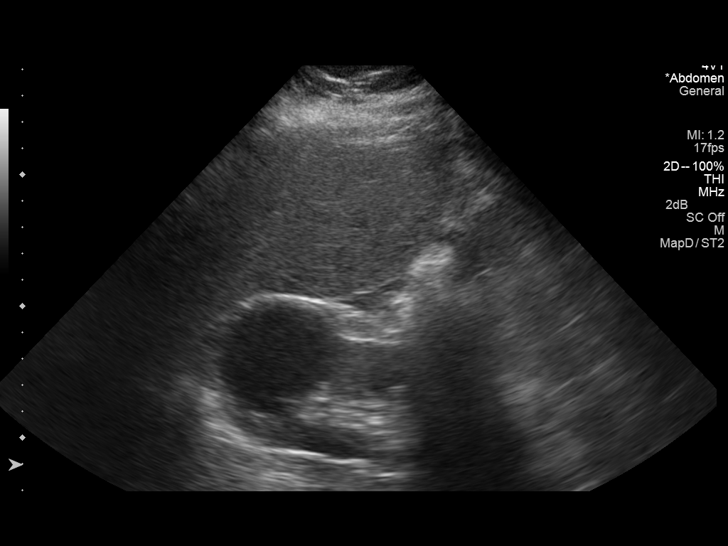
[im 37/60]
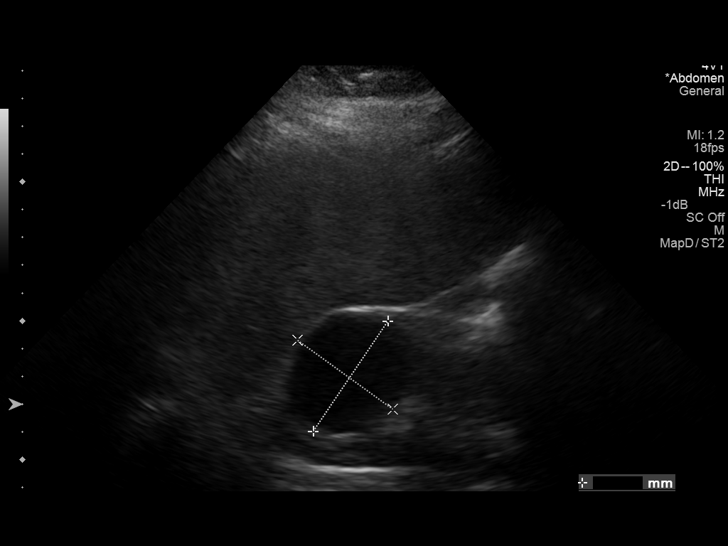
[im 40/60]
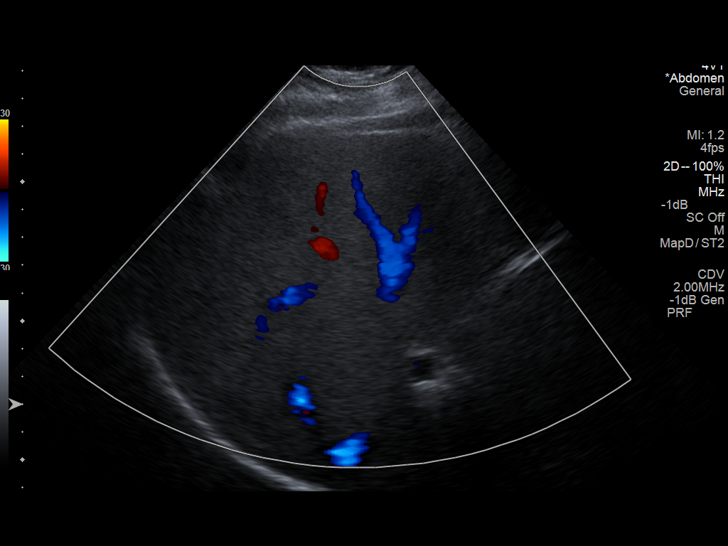
[im 45/60]
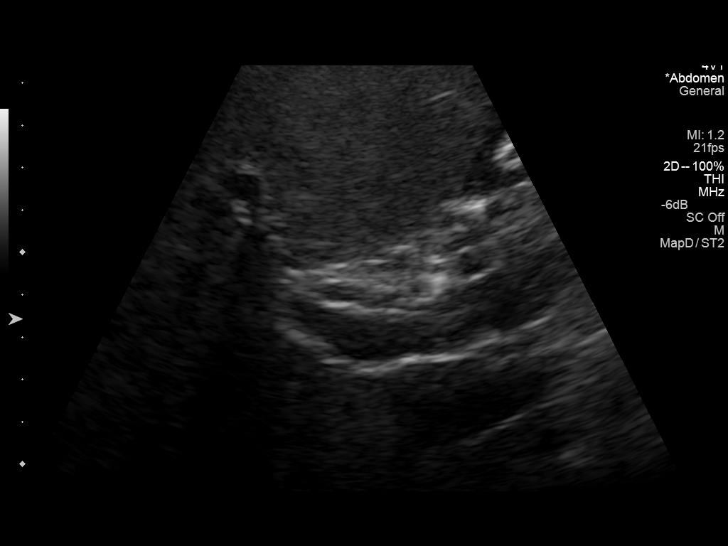
[im 50/60]
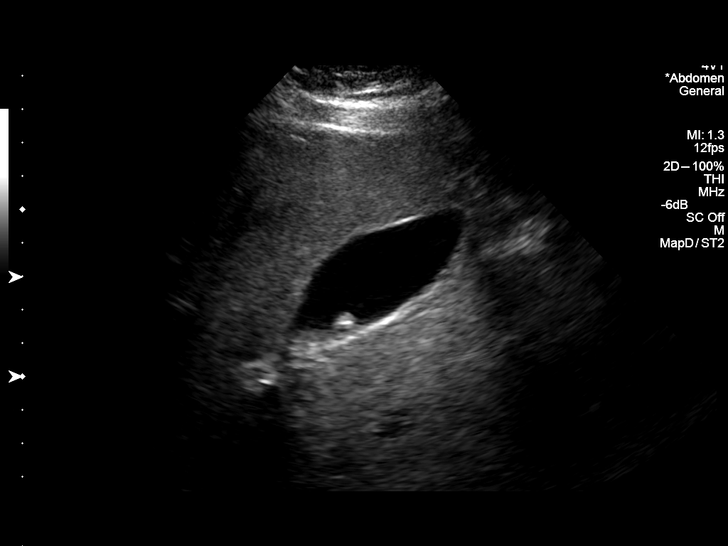
[im 55/60]
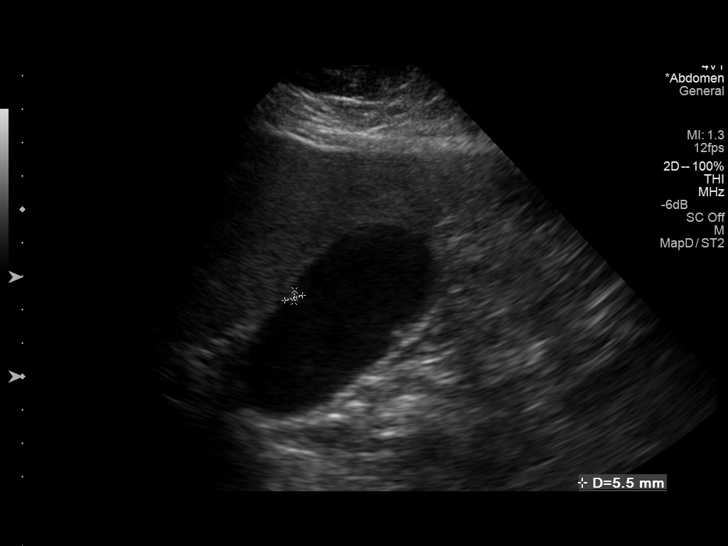
[im 60/60]
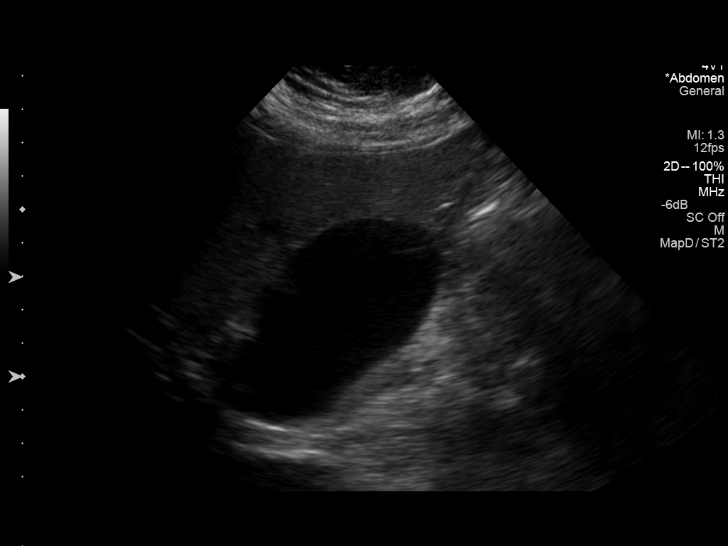

[14 of 25 positions shown; findings below may reference images not displayed]

FINDINGS: Gallbladder:

No shadowing gallstones are noted within gallbladder. At least 2
gallbladder wall polyps are noted measuring 7.5 x 6 mm and second
7.6 x 6 mm

No thickening of gallbladder wall.  No sonographic Murphy's sign.

Common bile duct:

Diameter: 4 mm in diameter within normal limits.

Liver: Diffuse increased echogenicity of the liver consistent with
fatty infiltration. No focal hepatic mass.

Again noted cyst in upper pole of the right kidney measures 4.8 x
4.8 cm.

.
IMPRESSION: 1. No shadowing gallstones are noted within gallbladder. At least 2
gallbladder wall polyps again noted measures 7.5 x 6 mm and 7.6 x 6
mm. On the prior exam the polyp measures 5.1 mm.
2. Normal CBD.
3. Question mild hepatic fatty infiltration.
4. Cyst in upper pole of the right kidney measures 4.8 cm. On the
prior exam measures 2.6 cm.

## 2014-09-12 ENCOUNTER — Telehealth: Payer: Self-pay | Admitting: Internal Medicine

## 2014-09-12 NOTE — Progress Notes (Signed)
Quick Note:  Gallbladder polyps. This needs to be followed closely; could proceed with HIDA scan to evaluate for biliary dyskinesia.  How is patient doing? ______ 

## 2014-09-12 NOTE — Telephone Encounter (Signed)
PATIENT CALLED INQUIRING ABOUT ULTRASOUND RESULTS.  PLEASE CALL HER CELL PHONE IS YOU TRY TO CALL HER.

## 2014-09-12 NOTE — Telephone Encounter (Signed)
Routing to AS 

## 2014-09-12 NOTE — Telephone Encounter (Signed)
See result note.  

## 2014-09-13 ENCOUNTER — Telehealth: Payer: Self-pay

## 2014-09-13 ENCOUNTER — Other Ambulatory Visit: Payer: Self-pay | Admitting: Gastroenterology

## 2014-09-13 ENCOUNTER — Other Ambulatory Visit: Payer: Self-pay

## 2014-09-13 DIAGNOSIS — R1011 Right upper quadrant pain: Secondary | ICD-10-CM

## 2014-09-13 NOTE — Telephone Encounter (Signed)
Tried to call pt- LMOM 

## 2014-09-13 NOTE — Telephone Encounter (Signed)
I spoke with the pt about her U/S results.

## 2014-09-13 NOTE — Telephone Encounter (Signed)
Called pt to inform her about the HIDA scan.  It has been set up for 09/18/2014 @ [facility].  LMOM

## 2014-09-14 NOTE — Telephone Encounter (Signed)
Pt left message at office and I returned her call this am. Pt is aware of appointment.

## 2014-09-18 ENCOUNTER — Encounter (HOSPITAL_COMMUNITY): Payer: Self-pay

## 2014-09-18 ENCOUNTER — Ambulatory Visit (HOSPITAL_COMMUNITY)
Admission: RE | Admit: 2014-09-18 | Discharge: 2014-09-18 | Disposition: A | Discharge status: Home / Self Care | Payer: 59 | Source: Ambulatory Visit | Attending: Gastroenterology | Admitting: Gastroenterology

## 2014-09-18 DIAGNOSIS — R1011 Right upper quadrant pain: Secondary | ICD-10-CM

## 2014-09-18 IMAGING — NM NM HEPATO W/GB/PHARM/[PERSON_NAME]
2 series · 12 of 12 positions shown · non-contrast
Comparison: Abdominal ultrasound [DATE]

CLINICAL DATA: Right upper quadrant abdominal pain

EXAM:
NUCLEAR MEDICINE HEPATOBILIARY IMAGING WITH GALLBLADDER EF
TECHNIQUE: Sequential images of the abdomen were obtained [DATE] minutes
following intravenous administration of radiopharmaceutical. After
slow intravenous infusion of 2.09 Micrograms Cholecystokinin,
gallbladder ejection fraction was determined.
RADIOPHARMACEUTICALS:  5 Millicurie [QV] Choletec

[Series 1: biliary · 3.25mm/px · 6 of 60 frames shown]
[frame 6/60]
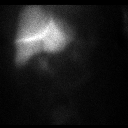
[frame 16/60]
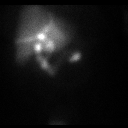
[frame 26/60]
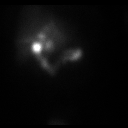
[frame 36/60]
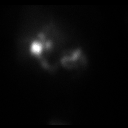
[frame 46/60]
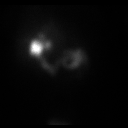
[frame 56/60]
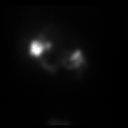

[Series 2: gbef · 3.25mm/px · 6 of 30 frames shown]
[frame 3/30]
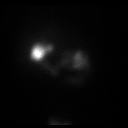
[frame 8/30]
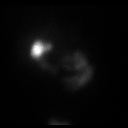
[frame 13/30]
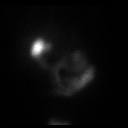
[frame 18/30]
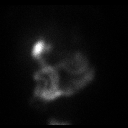
[frame 23/30]
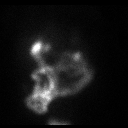
[frame 28/30]
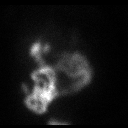

[12 of 12 positions shown; findings below may reference images not displayed]

FINDINGS: There is adequate uptake of the radiopharmaceutical live by the
liver. Activity is visualized within the intrahepatic ducts, common
bile duct, and bowel by 10 min. The gallbladder is clearly visible
by 20 min. The 30 min gallbladder ejection fraction is normal at
77%.. At 30 min, normal ejection fraction is expected to the greater
than 30%.

The patient did experience her typical throughout the study with no
change during CCK infusion.
IMPRESSION: Normal hepatobiliary scan with normal gallbladder ejection fraction.

## 2014-09-18 MED ORDER — TECHNETIUM TC 99M MEBROFENIN IV KIT
5.0000 | PACK | Freq: Once | INTRAVENOUS | Status: AC | PRN
  Administered 2014-09-18: 5 via INTRAVENOUS

## 2014-09-18 MED ORDER — SODIUM CHLORIDE 0.9 % IJ SOLN
INTRAMUSCULAR | Status: AC
  Filled 2014-09-18: qty 12

## 2014-09-18 MED ORDER — STERILE WATER FOR INJECTION IJ SOLN
INTRAMUSCULAR | Status: AC
Start: 2014-09-18 — End: 2014-09-18
  Administered 2014-09-18: 2.09 mL via INTRAVENOUS
  Filled 2014-09-18: qty 10

## 2014-09-18 MED ORDER — SINCALIDE 5 MCG IJ SOLR
INTRAMUSCULAR | Status: AC
  Administered 2014-09-18: 2.09 ug via INTRAVENOUS
  Filled 2014-09-18: qty 5

## 2014-09-20 NOTE — Progress Notes (Signed)
Quick Note:  HIDA reviewed. Normal EF at 77%. No change in symptoms during CCK infusion. If she is not better with a PPI, I recommend EGD with Dr. Gala Romney to assess for occult gastritis, PUD. ______

## 2014-09-25 ENCOUNTER — Other Ambulatory Visit: Payer: Self-pay

## 2014-09-25 DIAGNOSIS — R1011 Right upper quadrant pain: Secondary | ICD-10-CM

## 2014-09-27 ENCOUNTER — Telehealth: Payer: Self-pay

## 2014-09-27 NOTE — Telephone Encounter (Signed)
Renee from [facility] pre-service center called and stated that EGD needed pre-cert. Called [facility] and spoke with Harrison Mons. And she stated that pt needed pre-cert. She requested for clinical notes to be faxed in to [phone]. [place] office note and clinical notes to verify why pt needed procedure.  Also included in fax for pre-cert to be determined ASAP because pt has procedure scheduled for 10/02/2014.

## 2014-09-28 NOTE — Telephone Encounter (Signed)
Spoke with Hinton Dyer at [facility] in regards to EGD pre-cert.  PA # is [id number]. Pre-cert is good from 02/77/4128-78/67/6720.

## 2014-10-02 ENCOUNTER — Ambulatory Visit (HOSPITAL_COMMUNITY)
Admission: RE | Admit: 2014-10-02 | Discharge: 2014-10-02 | Disposition: A | Discharge status: Home / Self Care | Payer: 59 | Source: Ambulatory Visit | Attending: Internal Medicine | Admitting: Internal Medicine

## 2014-10-02 ENCOUNTER — Encounter (HOSPITAL_COMMUNITY): Payer: Self-pay | Admitting: *Deleted

## 2014-10-02 ENCOUNTER — Encounter (HOSPITAL_COMMUNITY)
Admission: RE | Disposition: A | Discharge status: Home / Self Care | Payer: Self-pay | Source: Ambulatory Visit | Attending: Internal Medicine

## 2014-10-02 DIAGNOSIS — R1011 Right upper quadrant pain: Secondary | ICD-10-CM | POA: Diagnosis present

## 2014-10-02 DIAGNOSIS — K449 Diaphragmatic hernia without obstruction or gangrene: Secondary | ICD-10-CM | POA: Diagnosis not present

## 2014-10-02 DIAGNOSIS — K317 Polyp of stomach and duodenum: Secondary | ICD-10-CM | POA: Diagnosis not present

## 2014-10-02 DIAGNOSIS — K219 Gastro-esophageal reflux disease without esophagitis: Secondary | ICD-10-CM | POA: Diagnosis not present

## 2014-10-02 DIAGNOSIS — K824 Cholesterolosis of gallbladder: Secondary | ICD-10-CM | POA: Insufficient documentation

## 2014-10-02 HISTORY — PX: ESOPHAGOGASTRODUODENOSCOPY: SHX5428

## 2014-10-02 SURGERY — EGD (ESOPHAGOGASTRODUODENOSCOPY)
Anesthesia: Moderate Sedation

## 2014-10-02 MED ORDER — STERILE WATER FOR IRRIGATION IR SOLN
Status: DC | PRN
  Administered 2014-10-02: 09:00:00

## 2014-10-02 MED ORDER — LIDOCAINE VISCOUS 2 % MT SOLN
OROMUCOSAL | Status: AC
  Filled 2014-10-02: qty 15

## 2014-10-02 MED ORDER — ONDANSETRON HCL 4 MG/2ML IJ SOLN
INTRAMUSCULAR | Status: DC | PRN
  Administered 2014-10-02: 4 mg via INTRAVENOUS

## 2014-10-02 MED ORDER — MEPERIDINE HCL 100 MG/ML IJ SOLN
INTRAMUSCULAR | Status: DC | PRN
  Administered 2014-10-02: 25 mg via INTRAVENOUS
  Administered 2014-10-02: 50 mg via INTRAVENOUS
  Administered 2014-10-02 (×2): 25 mg via INTRAVENOUS

## 2014-10-02 MED ORDER — MIDAZOLAM HCL 5 MG/5ML IJ SOLN
INTRAMUSCULAR | Status: AC
  Filled 2014-10-02: qty 10

## 2014-10-02 MED ORDER — LIDOCAINE VISCOUS 2 % MT SOLN
OROMUCOSAL | Status: DC | PRN
  Administered 2014-10-02: 3 mL via OROMUCOSAL

## 2014-10-02 MED ORDER — ONDANSETRON HCL 4 MG/2ML IJ SOLN
INTRAMUSCULAR | Status: AC
  Filled 2014-10-02: qty 2

## 2014-10-02 MED ORDER — SODIUM CHLORIDE 0.9 % IV SOLN
INTRAVENOUS | Status: DC
  Administered 2014-10-02: 1000 mL via INTRAVENOUS

## 2014-10-02 MED ORDER — MEPERIDINE HCL 100 MG/ML IJ SOLN
INTRAMUSCULAR | Status: AC
  Filled 2014-10-02: qty 2

## 2014-10-02 MED ORDER — MIDAZOLAM HCL 5 MG/5ML IJ SOLN
INTRAMUSCULAR | Status: DC | PRN
  Administered 2014-10-02: 2 mg via INTRAVENOUS
  Administered 2014-10-02: 1 mg via INTRAVENOUS
  Administered 2014-10-02 (×2): 2 mg via INTRAVENOUS

## 2014-10-02 NOTE — Op Note (Signed)
[facility] [address] [place], [address]   ENDOSCOPY PROCEDURE REPORT  PATIENT: Fry, Heather  MR#: [id number] BIRTHDATE: 1976-03-17 , 38  yrs. old GENDER: female ENDOSCOPIST: R.  Garfield Cornea, MD FACP [facility] REFERRED BY: PROCEDURE DATE:  10/16/14 PROCEDURE:  EGD w/ biopsy INDICATIONS:  epigastric and right upper quadrant abdominal pain; some of her typical reflux symptoms improved with Protonix 40 mg daily. MEDICATIONS: Versed 7 mg IV and Demerol 125 mg IV in divided doses. Xylocaine gel orally.  Zofran 4 mg IV. ASA CLASS:      Class II  CONSENT: The risks, benefits, limitations, alternatives and imponderables have been discussed.  The potential for biopsy, esophogeal dilation, etc. have also been reviewed.  Questions have been answered.  All parties agreeable.  Please see the history and physical in the medical record for more information.  DESCRIPTION OF PROCEDURE: After the risks benefits and alternatives of the procedure were thoroughly explained, informed consent was obtained.  The EG-[id number] ([id number]) endoscope was introduced through the mouth and advanced to the second portion of the duodenum , limited by Without limitations. The instrument was slowly withdrawn as the mucosa was fully examined.    Normal appearing esophageal mucosa.  Stomach emptied  3 cm hiatal hernia.  Normal gastric mucosa aside from multiple 2-3 mm pedunculated polyps in the gastric body and fundus.  Patent pylorus.  Normal-appearing first and second portion of duodenum. One of the gastric polyps biopsied for histologic study. Retroflexed views revealed as previously described and Retroflexed views revealed a hiatal hernia.     The scope was then withdrawn from the patient and the procedure completed.  COMPLICATIONS: There were no immediate complications.  ENDOSCOPIC IMPRESSION: Hiatal hernia. Gastric polyps?"status post biopsy. Some of her symptoms are likely  related to reflux. Right upper quadrant abdominal pain may also be reflux related. Symptoms atypical for gallbladder disease. Modest increase in size of gallbladder polyps over a 6 year period.  RECOMMENDATIONS: Stop Protonix;  3 week trial of Dexilant 60 mg daily. Patient to go by my office for free samples. Follow-up pathology.  REPEAT EXAM:  eSigned:  R. Garfield Cornea, MD Rosalita Chessman [facility] 2014-10-16 9:18 AM    CC:  CPT CODES: ICD CODES:  The ICD and CPT codes recommended by this software are interpretations from the data that the clinical staff has captured with the software.  The verification of the translation of this report to the ICD and CPT codes and modifiers is the sole responsibility of the health care institution and practicing physician where this report was generated.  [employer] will not be held responsible for the validity of the ICD and CPT codes included on this report.  AMA assumes no liability for data contained or not contained herein. CPT is a Designer, television/film set of the [employer].  PATIENT NAME:  Heather, Fry MR#: [id number]

## 2014-10-02 NOTE — H&P (View-Only) (Signed)
cc'ed to pcp °

## 2014-10-02 NOTE — Discharge Instructions (Addendum)
EGD Discharge instructions Please read the instructions outlined below and refer to this sheet in the next few weeks. These discharge instructions provide you with general information on caring for yourself after you leave the hospital. Your doctor may also give you specific instructions. While your treatment has been planned according to the most current medical practices available, unavoidable complications occasionally occur. If you have any problems or questions after discharge, please call your doctor. ACTIVITY  You may resume your regular activity but move at a slower pace for the next 24 hours.   Take frequent rest periods for the next 24 hours.   Walking will help expel (get rid of) the air and reduce the bloated feeling in your abdomen.   No driving for 24 hours (because of the anesthesia (medicine) used during the test).   You may shower.   Do not sign any important legal documents or operate any machinery for 24 hours (because of the anesthesia used during the test).  NUTRITION  Drink plenty of fluids.   You may resume your normal diet.   Begin with a light meal and progress to your normal diet.   Avoid alcoholic beverages for 24 hours or as instructed by your caregiver.  MEDICATIONS  You may resume your normal medications unless your caregiver tells you otherwise.  WHAT YOU CAN EXPECT TODAY  You may experience abdominal discomfort such as a feeling of fullness or gas pains.  FOLLOW-UP  Your doctor will discuss the results of your test with you.  SEEK IMMEDIATE MEDICAL ATTENTION IF ANY OF THE FOLLOWING OCCUR:  Excessive nausea (feeling sick to your stomach) and/or vomiting.   Severe abdominal pain and distention (swelling).   Trouble swallowing.   Temperature over 101 F (37.8 C).   Rectal bleeding or vomiting of blood.    GERD information  For now, stop Protonix; begin Dexilant 60 mg daily-go by my office for a 3 week supply of samples    Further  recommendations to follow pending review of pathology report.Gastroesophageal Reflux Disease, Adult Gastroesophageal reflux disease (GERD) happens when acid from your stomach goes into your food pipe (esophagus). The acid can cause a burning feeling in your chest. Over time, the acid can make small holes (ulcers) in your food pipe.  HOME CARE  Ask your doctor for advice about:  Losing weight.  Quitting smoking.  Alcohol use.  Avoid foods and drinks that make your problems worse. You may want to avoid:  Caffeine and alcohol.  Chocolate.  Mints.  Garlic and onions.  Spicy foods.  Citrus fruits, such as oranges, lemons, or limes.  Foods that contain tomato, such as sauce, chili, salsa, and pizza.  Fried and fatty foods.  Avoid lying down for 3 hours before you go to bed or before you take a nap.  Eat small meals often, instead of large meals.  Wear loose-fitting clothing. Do not wear anything tight around your waist.  Raise (elevate) the head of your bed 6 to 8 inches with wood blocks. Using extra pillows does not help.  Only take medicines as told by your doctor.  Do not take aspirin or ibuprofen. GET HELP RIGHT AWAY IF:   You have pain in your arms, neck, jaw, teeth, or back.  Your pain gets worse or changes.  You feel sick to your stomach (nauseous), throw up (vomit), or sweat (diaphoresis).  You feel short of breath, or you pass out (faint).  Your throw up is green, yellow, black, or  looks like coffee grounds or blood.  Your poop (stool) is red, bloody, or black. MAKE SURE YOU:   Understand these instructions.  Will watch your condition.  Will get help right away if you are not doing well or get worse. Document Released: 03/03/2008 Document Revised: 12/08/2011 Document Reviewed: 04/04/2011 [facility] Patient Information 2015 [place], [place]. This information is not intended to replace advice given to you by your health care provider. Make sure you discuss  any questions you have with your health care provider.   Colon Polyps Polyps are lumps of extra tissue growing inside the body. Polyps can grow in the large intestine (colon). Most colon polyps are noncancerous (benign). However, some colon polyps can become cancerous over time. Polyps that are larger than a pea may be harmful. To be safe, caregivers remove and test all polyps. CAUSES  Polyps form when mutations in the genes cause your cells to grow and divide even though no more tissue is needed. RISK FACTORS There are a number of risk factors that can increase your chances of getting colon polyps. They include:  Being older than 50 years.  Family history of colon polyps or colon cancer.  Long-term colon diseases, such as colitis or Crohn disease.  Being overweight.  Smoking.  Being inactive.  Drinking too much alcohol. SYMPTOMS  Most small polyps do not cause symptoms. If symptoms are present, they may include:  Blood in the stool. The stool may look dark red or black.  Constipation or diarrhea that lasts longer than 1 week. DIAGNOSIS People often do not know they have polyps until their caregiver finds them during a regular checkup. Your caregiver can use 4 tests to check for polyps:  Digital rectal exam. The caregiver wears gloves and feels inside the rectum. This test would find polyps only in the rectum.  Barium enema. The caregiver puts a liquid called barium into your rectum before taking X-rays of your colon. Barium makes your colon look white. Polyps are dark, so they are easy to see in the X-ray pictures.  Sigmoidoscopy. A thin, flexible tube (sigmoidoscope) is placed into your rectum. The sigmoidoscope has a light and tiny camera in it. The caregiver uses the sigmoidoscope to look at the last third of your colon.  Colonoscopy. This test is like sigmoidoscopy, but the caregiver looks at the entire colon. This is the most common method for finding and removing  polyps. TREATMENT  Any polyps will be removed during a sigmoidoscopy or colonoscopy. The polyps are then tested for cancer. PREVENTION  To help lower your risk of getting more colon polyps:  Eat plenty of fruits and vegetables. Avoid eating fatty foods.  Do not smoke.  Avoid drinking alcohol.  Exercise every day.  Lose weight if recommended by your caregiver.  Eat plenty of calcium and folate. Foods that are rich in calcium include milk, cheese, and broccoli. Foods that are rich in folate include chickpeas, kidney beans, and spinach. HOME CARE INSTRUCTIONS Keep all follow-up appointments as directed by your caregiver. You may need periodic exams to check for polyps. SEEK MEDICAL CARE IF: You notice bleeding during a bowel movement. Document Released: 06/11/2004 Document Revised: 12/08/2011 Document Reviewed: 11/25/2011 [facility] Patient Information 2015 [place], [place]. This information is not intended to replace advice given to you by your health care provider. Make sure you discuss any questions you have with your health care provider.

## 2014-10-02 NOTE — H&P (View-Only) (Signed)
Quick Note:  Gallbladder polyps. This needs to be followed closely; could proceed with HIDA scan to evaluate for biliary dyskinesia.  How is patient doing? ______

## 2014-10-02 NOTE — Interval H&P Note (Signed)
History and Physical Interval Note:  10/02/2014 8:42 AM  Heather Fry  has presented today for surgery, with the diagnosis of RUQ pain  The various methods of treatment have been discussed with the patient and family. After consideration of risks, benefits and other options for treatment, the patient has consented to  Procedure(s) with comments: ESOPHAGOGASTRODUODENOSCOPY (EGD) (N/A) - 945am - moved to 8:45 - Ginger notified pt as a surgical intervention .  The patient's history has been reviewed, patient examined, no change in status, stable for surgery.  I have reviewed the patient's chart and labs.  Questions were answered to the patient's satisfaction.     Heather Fry  Typical reflux symptoms improved on Protonix; still with some right upper quadrant pain. EGD per plan today. No dysphagia.  The risks, benefits, limitations, alternatives and imponderables have been reviewed with the patient. Potential for esophageal dilation, biopsy, etc. have also been reviewed.  Questions have been answered. All parties agreeable.

## 2014-10-02 NOTE — Interval H&P Note (Signed)
History and Physical Interval Note:  10/02/2014 8:37 AM  Heather Fry  has presented today for surgery, with the diagnosis of RUQ pain  The various methods of treatment have been discussed with the patient and family. After consideration of risks, benefits and other options for treatment, the patient has consented to  Procedure(s) with comments: ESOPHAGOGASTRODUODENOSCOPY (EGD) (N/A) - 945am - moved to 8:45 - Ginger notified pt as a surgical intervention .  The patient's history has been reviewed, patient examined, no change in status, stable for surgery.  I have reviewed the patient's chart and labs.  Questions were answered to the patient's satisfaction.     Heather Fry   No change. EGD per plan.The risks, benefits, limitations, alternatives and imponderables have been reviewed with the patient. Potential for esophageal dilation, biopsy, etc. have also been reviewed.  Questions have been answered. All parties agreeable.

## 2014-10-03 ENCOUNTER — Encounter (HOSPITAL_COMMUNITY): Payer: Self-pay | Admitting: Internal Medicine

## 2014-10-04 ENCOUNTER — Encounter: Payer: Self-pay | Admitting: Internal Medicine

## 2014-10-11 ENCOUNTER — Telehealth: Payer: Self-pay | Admitting: Internal Medicine

## 2014-10-11 NOTE — Telephone Encounter (Signed)
I spoke with the pt- she said the protonix worked just as good as the Building surveyor. She does not have heartburn with either but she still has the same pain from her gallbladder area. She said it hurts off and on all day and she has nausea all the time. No vomiting. No fever. She wants to know if she needs to see a surgeon?

## 2014-10-11 NOTE — Telephone Encounter (Signed)
PATIENT TOOK HER LAST SAMPLE OF DEXILANT TODAY AND WANTS TO KNOW WHAT TO DO.  SHE IS ALSO HAVING PAIN IN HER GALLBLADDER STILL AND WANTS TO KNOW IF ANYTHING CAN BE DONE FOR THE PAIN.  PLEASE ADVISE

## 2014-10-16 ENCOUNTER — Other Ambulatory Visit: Payer: Self-pay

## 2014-10-16 DIAGNOSIS — R109 Unspecified abdominal pain: Secondary | ICD-10-CM

## 2014-10-16 NOTE — Telephone Encounter (Signed)
Referral has been made.

## 2014-10-16 NOTE — Telephone Encounter (Signed)
Patient with biliary-like pain even though ultrasound/HIDA negative/normal. Symptoms may still be emanating from the gallbladder. Received worthwhile to get an elective surgical consultation just for surgical opinion to see if the surgeon feels removal of her gallbladder would help.

## 2014-10-16 NOTE — Telephone Encounter (Signed)
See recommendations per Dr. Gala Romney.

## 2014-10-17 ENCOUNTER — Telehealth: Payer: Self-pay

## 2014-10-17 NOTE — Telephone Encounter (Signed)
Pt is aware.  

## 2014-10-17 NOTE — Telephone Encounter (Signed)
Called pt and she is aware of the appt with Dr. Arnoldo Morale on 11/02/2014@ 1030am

## 2014-11-03 ENCOUNTER — Encounter (HOSPITAL_COMMUNITY): Payer: Self-pay

## 2014-11-03 ENCOUNTER — Encounter (HOSPITAL_COMMUNITY)
Admission: RE | Admit: 2014-11-03 | Discharge: 2014-11-03 | Disposition: A | Discharge status: Home / Self Care | Payer: 59 | Source: Ambulatory Visit | Attending: General Surgery | Admitting: General Surgery

## 2014-11-03 DIAGNOSIS — F329 Major depressive disorder, single episode, unspecified: Secondary | ICD-10-CM | POA: Diagnosis not present

## 2014-11-03 DIAGNOSIS — Z87891 Personal history of nicotine dependence: Secondary | ICD-10-CM | POA: Diagnosis not present

## 2014-11-03 DIAGNOSIS — Z79899 Other long term (current) drug therapy: Secondary | ICD-10-CM | POA: Diagnosis not present

## 2014-11-03 DIAGNOSIS — K219 Gastro-esophageal reflux disease without esophagitis: Secondary | ICD-10-CM | POA: Diagnosis not present

## 2014-11-03 DIAGNOSIS — K811 Chronic cholecystitis: Secondary | ICD-10-CM | POA: Diagnosis not present

## 2014-11-03 HISTORY — DX: Gastro-esophageal reflux disease without esophagitis: K21.9

## 2014-11-03 LAB — BASIC METABOLIC PANEL
ANION GAP: 8 (ref 5–15)
BUN: 11 mg/dL (ref 6–23)
CALCIUM: 9.7 mg/dL (ref 8.4–10.5)
CHLORIDE: 107 mmol/L (ref 96–112)
CO2: 24 mmol/L (ref 19–32)
CREATININE: 0.53 mg/dL (ref 0.50–1.10)
GFR calc non Af Amer: >90 mL/min (ref >90)
GLUCOSE: 122 mg/dL — AB (ref 70–99)
Potassium: 3.8 mmol/L (ref 3.5–5.1)
Sodium: 139 mmol/L (ref 135–145)

## 2014-11-03 LAB — CBC WITH DIFFERENTIAL/PLATELET
BASOS ABS: 0 10*3/uL (ref 0.0–0.1)
BASOS PCT: 0 % (ref 0–1)
Eosinophils Absolute: 0.1 10*3/uL (ref 0.0–0.7)
Eosinophils Relative: 1 % (ref 0–5)
HEMATOCRIT: 40.3 % (ref 36.0–46.0)
Hemoglobin: 13.3 g/dL (ref 12.0–15.0)
LYMPHS PCT: 26 % (ref 12–46)
Lymphs Abs: 2.3 10*3/uL (ref 0.7–4.0)
MCH: 29.2 pg (ref 26.0–34.0)
MCHC: 33 g/dL (ref 30.0–36.0)
MCV: 88.4 fL (ref 78.0–100.0)
Monocytes Absolute: 0.4 10*3/uL (ref 0.1–1.0)
Monocytes Relative: 4 % (ref 3–12)
NEUTROS PCT: 69 % (ref 43–77)
Neutro Abs: 6.1 10*3/uL (ref 1.7–7.7)
Platelets: 263 10*3/uL (ref 150–400)
RBC: 4.56 MIL/uL (ref 3.87–5.11)
RDW: 13.3 % (ref 11.5–15.5)
WBC: 8.8 10*3/uL (ref 4.0–10.5)

## 2014-11-03 LAB — HEPATIC FUNCTION PANEL
ALT: 19 U/L (ref 0–35)
AST: 18 U/L (ref 0–37)
Albumin: 4 g/dL (ref 3.5–5.2)
Alkaline Phosphatase: 50 U/L (ref 39–117)
BILIRUBIN TOTAL: 0.4 mg/dL (ref 0.3–1.2)
Bilirubin, Direct: 0.1 mg/dL (ref 0.0–0.5)
Indirect Bilirubin: 0.3 mg/dL (ref 0.3–0.9)
Total Protein: 7.3 g/dL (ref 6.0–8.3)

## 2014-11-03 NOTE — H&P (Signed)
  NTS SOAP Note  Vital Signs:  Vitals as of: 02/28/8365: Systolic 294: Diastolic 98: Heart Rate 105: Temp 99.41F: Height 66ft 4in: Weight 234Lbs 0 Ounces: Pain Level 9: BMI 40.17  BMI : 40.17 kg/m2  Subjective: This 39 year old female presents for of right upper quadrant abdominal pain.  Has been present for a few months.  Made worse with eating.  Pain radiates around the right flank.  Makes reflux worse.  No fever,  chills,  jaundice.  U/S of gallbladder negative.  HIDA showed normal ejection fraction with reproducible symptoms with CCK per patient.  Review of Symptoms:  Constitutional:fatigue Head:unremarkable Eyes:unremarkable   sinus problems Cardiovascular:  unremarkable Respiratory:unremarkable Gastrointestinabdominal pain, nausea, heartburn Genitourinary:unremarkable   joint and neck pain Skin:unremarkable Hematolgic/Lymphatic:unremarkable   hay fever   Past Medical History:  Reviewed  Past Medical History  Surgical History: BTL,  oral surgery Medical Problems: anxiety Allergies: pcn Medications: lexapro,  protonix    Social History:Reviewed  Social History  Preferred Language: English Race:  White Ethnicity: Not Hispanic / Latino Age: 79 year Marital Status:  M Alcohol: no   Smoking Status: Never smoker reviewed on 11/02/2014 Functional Status reviewed on 11/02/2014 ------------------------------------------------ Bathing: Normal Cooking: Normal Dressing: Normal Driving: Normal Eating: Normal Managing Meds: Normal Oral Care: Normal Shopping: Normal Toileting: Normal Transferring: Normal Walking: Normal Cognitive Status reviewed on 11/02/2014 ------------------------------------------------ Attention: Normal Decision Making: Normal Language: Normal Memory: Normal Motor: Normal Perception: Normal Problem Solving: Normal Visual and Spatial: Normal   Family History:Reviewed  Family Health History Mother, Unknown; Ovarian  cancer;  Father, Living; Diabetes mellitus, unspecified type; Hypertension (high blood pressure); Hypercholesterolemia;     Objective Information: General:Well appearing, well nourished in no distress. no scleral icterus Heart:RRR, no murmur or gallop.  Normal S1, S2.  No S3, S4.  Lungs:  CTA bilaterally, no wheezes, rhonchi, rales.  Breathing unlabored. Abdomen:Soft, discomfort in right upper quadrant to palpation,  ND, no HSM, no masses.  Assessment:Chronic cholecystitis  Diagnoses: 575.11  K81.1 Chronic cholecystitis (Chronic cholecystitis)  Procedures: 76546 - OFFICE OUTPATIENT NEW 30 MINUTES    Plan:  Scheduled for laparoscopic cholecystectomy on 11/06/14.   Patient Education:Alternative treatments to surgery were discussed with patient (and family).  Risks and benefits  of procedure including bleeding,  infection,  hepatobiliary injury,  and the possibility of an open procedure were fully explained to the patient (and family) who gave informed consent. Patient/family questions were addressed.  Follow-up:Pending Surgery

## 2014-11-03 NOTE — Pre-Procedure Instructions (Signed)
Patient given information to sign up for my chart at home. 

## 2014-11-03 NOTE — Patient Instructions (Signed)
Heather Fry  11/03/2014   Your procedure is scheduled on:   11/06/2014  Report to [facility] at  915  AM.  Call this number if you have problems the morning of surgery: [phone]   Remember:   Do not eat food or drink liquids after midnight.   Take these medicines the morning of surgery with A SIP OF WATER: lexapro, protonix   Do not wear jewelry, make-up or nail polish.  Do not wear lotions, powders, or perfumes.   Do not shave 48 hours prior to surgery. Men may shave face and neck.  Do not bring valuables to the hospital.  [facility] is not responsible for any belongings or valuables.               Contacts, dentures or bridgework may not be worn into surgery.  Leave suitcase in the car. After surgery it may be brought to your room.  For patients admitted to the hospital, discharge time is determined by your treatment team.               Patients discharged the day of surgery will not be allowed to drive home.  Name and phone number of your driver: family  Special Instructions: Shower using CHG 2 nights before surgery and the night before surgery.  If you shower the day of surgery use CHG.  Use special wash - you have one bottle of CHG for all showers.  You should use approximately 1/3 of the bottle for each shower.   Please read over the following fact sheets that you were given: Pain Booklet, Coughing and Deep Breathing, Surgical Site Infection Prevention, Anesthesia Post-op Instructions and Care and Recovery After Surgery Laparoscopic Cholecystectomy Laparoscopic cholecystectomy is surgery to remove the gallbladder. The gallbladder is located in the upper right part of the abdomen, behind the liver. It is a storage sac for bile produced in the liver. Bile aids in the digestion and absorption of fats. Cholecystectomy is often done for inflammation of the gallbladder (cholecystitis). This condition is usually caused by a buildup of gallstones (cholelithiasis) in your  gallbladder. Gallstones can block the flow of bile, resulting in inflammation and pain. In severe cases, emergency surgery may be required. When emergency surgery is not required, you will have time to prepare for the procedure. Laparoscopic surgery is an alternative to open surgery. Laparoscopic surgery has a shorter recovery time. Your common bile duct may also need to be examined during the procedure. If stones are found in the common bile duct, they may be removed. LET [facility] CARE PROVIDER KNOW ABOUT:  Any allergies you have.  All medicines you are taking, including vitamins, herbs, eye drops, creams, and over-the-counter medicines.  Previous problems you or members of your family have had with the use of anesthetics.  Any blood disorders you have.  Previous surgeries you have had.  Medical conditions you have. RISKS AND COMPLICATIONS Generally, this is a safe procedure. However, as with any procedure, complications can occur. Possible complications include:  Infection.  Damage to the common bile duct, nerves, arteries, veins, or other internal organs such as the stomach, liver, or intestines.  Bleeding.  A stone may remain in the common bile duct.  A bile leak from the cyst duct that is clipped when your gallbladder is removed.  The need to convert to open surgery, which requires a larger incision in the abdomen. This may be necessary if your surgeon thinks  it is not safe to continue with a laparoscopic procedure. BEFORE THE PROCEDURE  Ask your health care provider about changing or stopping any regular medicines. You will need to stop taking aspirin or blood thinners at least 5 days prior to surgery.  Do not eat or drink anything after midnight the night before surgery.  Let your health care provider know if you develop a cold or other infectious problem before surgery. PROCEDURE   You will be given medicine to make you sleep through the procedure (general  anesthetic). A breathing tube will be placed in your mouth.  When you are asleep, your surgeon will make several small cuts (incisions) in your abdomen.  A thin, lighted tube with a tiny camera on the end (laparoscope) is inserted through one of the small incisions. The camera on the laparoscope sends a picture to a TV screen in the operating room. This gives the surgeon a good view inside your abdomen.  A gas will be pumped into your abdomen. This expands your abdomen so that the surgeon has more room to perform the surgery.  Other tools needed for the procedure are inserted through the other incisions. The gallbladder is removed through one of the incisions.  After the removal of your gallbladder, the incisions will be closed with stitches, staples, or skin glue. AFTER THE PROCEDURE  You will be taken to a recovery area where your progress will be checked often.  You may be allowed to go home the same day if your pain is controlled and you can tolerate liquids. Document Released: 09/15/2005 Document Revised: 07/06/2013 Document Reviewed: 04/27/2013 [facility] Patient Information 2015 [place], [place]. This information is not intended to replace advice given to you by your health care provider. Make sure you discuss any questions you have with your health care provider. PATIENT INSTRUCTIONS POST-ANESTHESIA  IMMEDIATELY FOLLOWING SURGERY:  Do not drive or operate machinery for the first twenty four hours after surgery.  Do not make any important decisions for twenty four hours after surgery or while taking narcotic pain medications or sedatives.  If you develop intractable nausea and vomiting or a severe headache please notify your doctor immediately.  FOLLOW-UP:  Please make an appointment with your surgeon as instructed. You do not need to follow up with anesthesia unless specifically instructed to do so.  WOUND CARE INSTRUCTIONS (if applicable):  Keep a dry clean dressing on the  anesthesia/puncture wound site if there is drainage.  Once the wound has quit draining you may leave it open to air.  Generally you should leave the bandage intact for twenty four hours unless there is drainage.  If the epidural site drains for more than 36-48 hours please call the anesthesia department.  QUESTIONS?:  Please feel free to call your physician or the hospital operator if you have any questions, and they will be happy to assist you.

## 2014-11-06 ENCOUNTER — Ambulatory Visit (HOSPITAL_COMMUNITY): Payer: 59 | Admitting: Anesthesiology

## 2014-11-06 ENCOUNTER — Ambulatory Visit (HOSPITAL_COMMUNITY)
Admission: RE | Admit: 2014-11-06 | Discharge: 2014-11-06 | Disposition: A | Discharge status: Home / Self Care | Payer: 59 | Source: Ambulatory Visit | Attending: General Surgery | Admitting: General Surgery

## 2014-11-06 ENCOUNTER — Encounter (HOSPITAL_COMMUNITY): Payer: Self-pay | Admitting: *Deleted

## 2014-11-06 ENCOUNTER — Encounter (HOSPITAL_COMMUNITY)
Admission: RE | Disposition: A | Discharge status: Home / Self Care | Payer: Self-pay | Source: Ambulatory Visit | Attending: General Surgery

## 2014-11-06 DIAGNOSIS — Z87891 Personal history of nicotine dependence: Secondary | ICD-10-CM | POA: Insufficient documentation

## 2014-11-06 DIAGNOSIS — K811 Chronic cholecystitis: Secondary | ICD-10-CM | POA: Insufficient documentation

## 2014-11-06 DIAGNOSIS — Z79899 Other long term (current) drug therapy: Secondary | ICD-10-CM | POA: Insufficient documentation

## 2014-11-06 DIAGNOSIS — K219 Gastro-esophageal reflux disease without esophagitis: Secondary | ICD-10-CM | POA: Insufficient documentation

## 2014-11-06 DIAGNOSIS — F329 Major depressive disorder, single episode, unspecified: Secondary | ICD-10-CM | POA: Insufficient documentation

## 2014-11-06 HISTORY — PX: CHOLECYSTECTOMY: SHX55

## 2014-11-06 SURGERY — LAPAROSCOPIC CHOLECYSTECTOMY
Anesthesia: General | Site: Abdomen

## 2014-11-06 MED ORDER — NEOSTIGMINE METHYLSULFATE 10 MG/10ML IV SOLN
INTRAVENOUS | Status: DC | PRN
  Administered 2014-11-06: 4 mg via INTRAVENOUS

## 2014-11-06 MED ORDER — DEXAMETHASONE SODIUM PHOSPHATE 4 MG/ML IJ SOLN
4.0000 mg | Freq: Once | INTRAMUSCULAR | Status: AC
  Administered 2014-11-06: 4 mg via INTRAVENOUS
  Filled 2014-11-06: qty 1

## 2014-11-06 MED ORDER — CIPROFLOXACIN IN D5W 400 MG/200ML IV SOLN
400.0000 mg | INTRAVENOUS | Status: AC
  Administered 2014-11-06: 400 mg via INTRAVENOUS

## 2014-11-06 MED ORDER — CHLORHEXIDINE GLUCONATE 4 % EX LIQD: 1.0000 "application " | Freq: Once | CUTANEOUS | Status: DC

## 2014-11-06 MED ORDER — MIDAZOLAM HCL 2 MG/2ML IJ SOLN
INTRAMUSCULAR | Status: AC
  Filled 2014-11-06: qty 2

## 2014-11-06 MED ORDER — ONDANSETRON HCL 4 MG/2ML IJ SOLN
4.0000 mg | Freq: Once | INTRAMUSCULAR | Status: AC | PRN
  Administered 2014-11-06: 4 mg via INTRAVENOUS

## 2014-11-06 MED ORDER — ROCURONIUM BROMIDE 100 MG/10ML IV SOLN
INTRAVENOUS | Status: DC | PRN
  Administered 2014-11-06: 22 mg via INTRAVENOUS
  Administered 2014-11-06: 8 mg via INTRAVENOUS

## 2014-11-06 MED ORDER — BUPIVACAINE HCL (PF) 0.5 % IJ SOLN
INTRAMUSCULAR | Status: DC | PRN
Start: 2014-11-06 — End: 2014-11-06
  Administered 2014-11-06: 10 mL

## 2014-11-06 MED ORDER — MIDAZOLAM HCL 2 MG/2ML IJ SOLN
1.0000 mg | INTRAMUSCULAR | Status: DC | PRN
  Administered 2014-11-06: 2 mg via INTRAVENOUS

## 2014-11-06 MED ORDER — 0.9 % SODIUM CHLORIDE (POUR BTL) OPTIME
TOPICAL | Status: DC | PRN
  Administered 2014-11-06: 1000 mL

## 2014-11-06 MED ORDER — PROPOFOL 10 MG/ML IV BOLUS
INTRAVENOUS | Status: DC | PRN
  Administered 2014-11-06: 180 mg via INTRAVENOUS

## 2014-11-06 MED ORDER — HEMOSTATIC AGENTS (NO CHARGE) OPTIME
TOPICAL | Status: DC | PRN
  Administered 2014-11-06: 1

## 2014-11-06 MED ORDER — GLYCOPYRROLATE 0.2 MG/ML IJ SOLN
0.2000 mg | Freq: Once | INTRAMUSCULAR | Status: AC
  Administered 2014-11-06: 0.2 mg via INTRAVENOUS

## 2014-11-06 MED ORDER — OXYCODONE-ACETAMINOPHEN 7.5-325 MG PO TABS: 1.0000 | ORAL_TABLET | ORAL | Status: DC | PRN

## 2014-11-06 MED ORDER — CIPROFLOXACIN IN D5W 400 MG/200ML IV SOLN
INTRAVENOUS | Status: AC
  Filled 2014-11-06: qty 200

## 2014-11-06 MED ORDER — GLYCOPYRROLATE 0.2 MG/ML IJ SOLN
INTRAMUSCULAR | Status: DC | PRN
  Administered 2014-11-06: .7 mg via INTRAVENOUS

## 2014-11-06 MED ORDER — SUCCINYLCHOLINE CHLORIDE 20 MG/ML IJ SOLN
INTRAMUSCULAR | Status: DC | PRN
  Administered 2014-11-06: 120 mg via INTRAVENOUS

## 2014-11-06 MED ORDER — FENTANYL CITRATE 0.05 MG/ML IJ SOLN
INTRAMUSCULAR | Status: DC | PRN
  Administered 2014-11-06: 50 ug via INTRAVENOUS
  Administered 2014-11-06: 25 ug via INTRAVENOUS
  Administered 2014-11-06 (×2): 50 ug via INTRAVENOUS
  Administered 2014-11-06: 25 ug via INTRAVENOUS
  Administered 2014-11-06: 50 ug via INTRAVENOUS

## 2014-11-06 MED ORDER — PROPOFOL 10 MG/ML IV BOLUS
INTRAVENOUS | Status: AC
  Filled 2014-11-06: qty 20

## 2014-11-06 MED ORDER — POVIDONE-IODINE 10 % OINT PACKET
TOPICAL_OINTMENT | CUTANEOUS | Status: DC | PRN
  Administered 2014-11-06: 1 via TOPICAL

## 2014-11-06 MED ORDER — POVIDONE-IODINE 10 % EX OINT
TOPICAL_OINTMENT | CUTANEOUS | Status: AC
  Filled 2014-11-06: qty 1

## 2014-11-06 MED ORDER — GLYCOPYRROLATE 0.2 MG/ML IJ SOLN
INTRAMUSCULAR | Status: AC
  Filled 2014-11-06: qty 4

## 2014-11-06 MED ORDER — FENTANYL CITRATE 0.05 MG/ML IJ SOLN
INTRAMUSCULAR | Status: AC
  Filled 2014-11-06: qty 2

## 2014-11-06 MED ORDER — NEOSTIGMINE METHYLSULFATE 10 MG/10ML IV SOLN
INTRAVENOUS | Status: AC
  Filled 2014-11-06: qty 1

## 2014-11-06 MED ORDER — FENTANYL CITRATE 0.05 MG/ML IJ SOLN
INTRAMUSCULAR | Status: AC
  Filled 2014-11-06: qty 5

## 2014-11-06 MED ORDER — ONDANSETRON HCL 4 MG/2ML IJ SOLN
4.0000 mg | Freq: Once | INTRAMUSCULAR | Status: AC
  Administered 2014-11-06: 4 mg via INTRAVENOUS

## 2014-11-06 MED ORDER — ONDANSETRON HCL 4 MG/2ML IJ SOLN
INTRAMUSCULAR | Status: AC
  Filled 2014-11-06: qty 2

## 2014-11-06 MED ORDER — SUCCINYLCHOLINE CHLORIDE 20 MG/ML IJ SOLN
INTRAMUSCULAR | Status: AC
  Filled 2014-11-06: qty 1

## 2014-11-06 MED ORDER — LIDOCAINE HCL 1 % IJ SOLN
INTRAMUSCULAR | Status: DC | PRN
  Administered 2014-11-06: 40 mg via INTRADERMAL

## 2014-11-06 MED ORDER — LACTATED RINGERS IV SOLN
INTRAVENOUS | Status: DC
  Administered 2014-11-06: 1000 mL via INTRAVENOUS
  Administered 2014-11-06: 11:00:00 via INTRAVENOUS

## 2014-11-06 MED ORDER — LIDOCAINE HCL (PF) 1 % IJ SOLN
INTRAMUSCULAR | Status: AC
  Filled 2014-11-06: qty 10

## 2014-11-06 MED ORDER — FENTANYL CITRATE 0.05 MG/ML IJ SOLN
25.0000 ug | INTRAMUSCULAR | Status: DC | PRN
  Administered 2014-11-06 (×2): 50 ug via INTRAVENOUS

## 2014-11-06 MED ORDER — ROCURONIUM BROMIDE 50 MG/5ML IV SOLN
INTRAVENOUS | Status: AC
  Filled 2014-11-06: qty 1

## 2014-11-06 MED ORDER — BUPIVACAINE HCL (PF) 0.5 % IJ SOLN
INTRAMUSCULAR | Status: AC
  Filled 2014-11-06: qty 30

## 2014-11-06 MED ORDER — KETOROLAC TROMETHAMINE 30 MG/ML IJ SOLN
INTRAMUSCULAR | Status: AC
  Filled 2014-11-06: qty 1

## 2014-11-06 MED ORDER — KETOROLAC TROMETHAMINE 30 MG/ML IJ SOLN
30.0000 mg | Freq: Once | INTRAMUSCULAR | Status: AC
  Administered 2014-11-06: 30 mg via INTRAVENOUS

## 2014-11-06 MED ORDER — GLYCOPYRROLATE 0.2 MG/ML IJ SOLN
INTRAMUSCULAR | Status: AC
  Filled 2014-11-06: qty 1

## 2014-11-06 SURGICAL SUPPLY — 43 items
APPLIER CLIP LAPSCP 10X32 DD (CLIP) ×3 IMPLANT
BAG HAMPER (MISCELLANEOUS) ×3 IMPLANT
CHLORAPREP W/TINT 26ML (MISCELLANEOUS) ×3 IMPLANT
CLOTH BEACON ORANGE TIMEOUT ST (SAFETY) ×3 IMPLANT
COVER LIGHT HANDLE STERIS (MISCELLANEOUS) ×6 IMPLANT
DECANTER SPIKE VIAL GLASS SM (MISCELLANEOUS) ×3 IMPLANT
DRSG TEGADERM 2-3/8X2-3/4 SM (GAUZE/BANDAGES/DRESSINGS) ×3 IMPLANT
ELECT REM PT RETURN 9FT ADLT (ELECTROSURGICAL) ×3
ELECTRODE REM PT RTRN 9FT ADLT (ELECTROSURGICAL) ×1 IMPLANT
FILTER SMOKE EVAC LAPAROSHD (FILTER) ×3 IMPLANT
FORMALIN 10 PREFIL 120ML (MISCELLANEOUS) ×3 IMPLANT
GAUZE PETROLATUM 1 X8 (GAUZE/BANDAGES/DRESSINGS) ×3 IMPLANT
GLOVE BIO SURGEON STRL SZ7 (GLOVE) ×6 IMPLANT
GLOVE BIOGEL PI IND STRL 7.0 (GLOVE) ×2 IMPLANT
GLOVE BIOGEL PI INDICATOR 7.0 (GLOVE) ×4
GLOVE SURG SS PI 7.5 STRL IVOR (GLOVE) ×3 IMPLANT
GOWN STRL REUS W/ TWL XL LVL3 (GOWN DISPOSABLE) ×1 IMPLANT
GOWN STRL REUS W/TWL LRG LVL3 (GOWN DISPOSABLE) ×6 IMPLANT
GOWN STRL REUS W/TWL XL LVL3 (GOWN DISPOSABLE) ×2
HEMOSTAT SNOW SURGICEL 2X4 (HEMOSTASIS) ×3 IMPLANT
INST SET LAPROSCOPIC AP (KITS) ×3 IMPLANT
IV NS IRRIG 3000ML ARTHROMATIC (IV SOLUTION) IMPLANT
KIT ROOM TURNOVER APOR (KITS) ×3 IMPLANT
MANIFOLD NEPTUNE II (INSTRUMENTS) ×3 IMPLANT
NEEDLE INSUFFLATION 14GA 120MM (NEEDLE) ×3 IMPLANT
NS IRRIG 1000ML POUR BTL (IV SOLUTION) ×3 IMPLANT
PACK LAP CHOLE LZT030E (CUSTOM PROCEDURE TRAY) ×3 IMPLANT
PAD ARMBOARD 7.5X6 YLW CONV (MISCELLANEOUS) ×3 IMPLANT
POUCH SPECIMEN RETRIEVAL 10MM (ENDOMECHANICALS) ×3 IMPLANT
SET BASIN LINEN APH (SET/KITS/TRAYS/PACK) ×3 IMPLANT
SET TUBE IRRIG SUCTION NO TIP (IRRIGATION / IRRIGATOR) IMPLANT
SLEEVE ENDOPATH XCEL 5M (ENDOMECHANICALS) ×3 IMPLANT
SPONGE GAUZE 2X2 8PLY STER LF (GAUZE/BANDAGES/DRESSINGS) ×4
SPONGE GAUZE 2X2 8PLY STRL LF (GAUZE/BANDAGES/DRESSINGS) ×8 IMPLANT
STAPLER VISISTAT (STAPLE) ×3 IMPLANT
SUT VICRYL 0 UR6 27IN ABS (SUTURE) ×3 IMPLANT
TAPE CLOTH SURG 4X10 WHT LF (GAUZE/BANDAGES/DRESSINGS) ×3 IMPLANT
TROCAR ENDO BLADELESS 11MM (ENDOMECHANICALS) ×3 IMPLANT
TROCAR XCEL NON-BLD 5MMX100MML (ENDOMECHANICALS) ×3 IMPLANT
TROCAR XCEL UNIV SLVE 11M 100M (ENDOMECHANICALS) ×3 IMPLANT
TUBING INSUFFLATION (TUBING) ×3 IMPLANT
WARMER LAPAROSCOPE (MISCELLANEOUS) ×3 IMPLANT
YANKAUER SUCT 12FT TUBE ARGYLE (SUCTIONS) ×3 IMPLANT

## 2014-11-06 NOTE — Anesthesia Postprocedure Evaluation (Signed)
  Anesthesia Post-op Note  Patient: Heather Fry  Procedure(s) Performed: Procedure(s): LAPAROSCOPIC CHOLECYSTECTOMY (N/A)  Patient Location: PACU and Short Stay  Anesthesia Type:General  Level of Consciousness: awake, alert  and oriented  Airway and Oxygen Therapy: Patient Spontanous Breathing  Post-op Pain: mild  Post-op Assessment: Post-op Vital signs reviewed, Patient's Cardiovascular Status Stable, Respiratory Function Stable, Patent Airway and No signs of Nausea or vomiting  Post-op Vital Signs: Reviewed and stable  Last Vitals:  Filed Vitals:   11/06/14 1149  BP: 128/78  Pulse: 100  Temp: 36.8 C  Resp: 18    Complications: No apparent anesthesia complications

## 2014-11-06 NOTE — Discharge Instructions (Signed)

## 2014-11-06 NOTE — Op Note (Signed)
Patient:  Heather Fry  DOB:  1976/04/25  MRN:  [id number]   Preop Diagnosis:  Chronic cholecystitis  Postop Diagnosis:  Same  Procedure:  Laparoscopic cholecystectomy  Surgeon:  Aviva Signs, M.D.  Anes:  Gen. endotracheal  Indications:  Patient is a 39 year old white female who presents with biliary colic secondary to chronic cholecystitis. The risks and benefits of the procedure including bleeding, infection, hepatobiliary injury, the possibility of an open procedure were fully explained to the patient, who gave informed consent.  Procedure note:  The patient was placed the supine position. After induction of general endotracheal anesthesia, the abdomen was prepped and draped using the usual sterile technique with ChloraPrep. Surgical site confirmation was performed.  A supraumbilical incision was made down to the fascia. A Veress needle was introduced into the abdominal cavity and confirmation of placement was done using the saline drop test. The abdomen was then insufflated to 16 mmHg pressure. An 11 mm trocar was introduced into the abdominal cavity under direct visualization without difficulty. The patient was placed in reverse Trendelenburg position and additional 11 mm trocar was placed the epigastric region and 5 mm trochars were placed the right upper quadrant and right flank regions. Liver was inspected and noted within normal limits. The gallbladder was retracted in a dynamic fashion in order to expose the triangle of Calot. The cystic duct was first identified. Its junction to the infundibulum was fully identified. Endoclips were placed proximally and distally on the cystic duct, and the cystic duct was divided. This was likewise done the cystic artery. The gallbladder was freed away from the gallbladder fossa using Bovie electrocautery. The gallbladder was delivered to the epigastric trocar site using an Endo Catch bag. The gallbladder fossa was inspected and no abnormal  bleeding or bile leakage was noted. Surgicel is the gallbladder fossa. All fluid and air were then evacuated from the abdominal cavity prior to removal of the trochars.  All wounds were irrigated with normal saline. All wounds were injected with 0.5% Sensorcaine. The supraumbilical fascia as well as epigastric fascia were reapproximated using 0 Vicryl interrupted sutures. All skin incisions were closed using staples. Betadine ointment and dry sterile dressings were applied.  All tape and needle counts were correct at the end of the procedure. Patient was extubated in the operating room and transferred to PACU in stable condition.  Complications:  None  EBL:  Minimal  Specimen:  Gallbladder

## 2014-11-06 NOTE — Interval H&P Note (Signed)
History and Physical Interval Note:  11/06/2014 9:20 AM  Heather Fry  has presented today for surgery, with the diagnosis of chronic cholecystitis  The various methods of treatment have been discussed with the patient and family. After consideration of risks, benefits and other options for treatment, the patient has consented to  Procedure(s): LAPAROSCOPIC CHOLECYSTECTOMY (N/A) as a surgical intervention .  The patient's history has been reviewed, patient examined, no change in status, stable for surgery.  I have reviewed the patient's chart and labs.  Questions were answered to the patient's satisfaction.     Aviva Signs A

## 2014-11-06 NOTE — Anesthesia Preprocedure Evaluation (Signed)
Anesthesia Evaluation  Patient identified by MRN, date of birth, ID band Patient awake    Reviewed: Allergy & Precautions, NPO status , Patient's Chart, lab work & pertinent test results  Airway Mallampati: II  TM Distance: >3 FB     Dental  (+) Partial Lower, Partial Upper   Pulmonary former smoker,  breath sounds clear to auscultation        Cardiovascular negative cardio ROS  Rhythm:Regular Rate:Normal     Neuro/Psych PSYCHIATRIC DISORDERS Anxiety Depression    GI/Hepatic GERD-  ,Nausea today    Endo/Other    Renal/GU      Musculoskeletal   Abdominal   Peds  Hematology   Anesthesia Other Findings   Reproductive/Obstetrics                             Anesthesia Physical Anesthesia Plan  ASA: II  Anesthesia Plan: General   Post-op Pain Management:    Induction: Rapid sequence, Intravenous and Cricoid pressure planned  Airway Management Planned: Oral ETT  Additional Equipment:   Intra-op Plan:   Post-operative Plan: Extubation in OR  Informed Consent: I have reviewed the patients History and Physical, chart, labs and discussed the procedure including the risks, benefits and alternatives for the proposed anesthesia with the patient or authorized representative who has indicated his/her understanding and acceptance.     Plan Discussed with:   Anesthesia Plan Comments:         Anesthesia Quick Evaluation

## 2014-11-06 NOTE — Transfer of Care (Signed)
Immediate Anesthesia Transfer of Care Note  Patient: Heather Fry  Procedure(s) Performed: Procedure(s): LAPAROSCOPIC CHOLECYSTECTOMY (N/A)  Patient Location: PACU  Anesthesia Type:General  Level of Consciousness: awake  Airway & Oxygen Therapy: Patient Spontanous Breathing and Patient connected to face mask oxygen  Post-op Assessment: Report given to RN  Post vital signs: Reviewed and stable  Last Vitals:  Filed Vitals:   11/06/14 0930  BP: 136/83  Temp:   Resp: 38    Complications: No apparent anesthesia complications

## 2014-11-06 NOTE — Anesthesia Procedure Notes (Signed)
Procedure Name: Intubation Date/Time: 11/06/2014 9:45 AM Performed by: Tressie Stalker E Pre-anesthesia Checklist: Patient identified, Patient being monitored, Timeout performed, Emergency Drugs available and Suction available Patient Re-evaluated:Patient Re-evaluated prior to inductionOxygen Delivery Method: Circle System Utilized Preoxygenation: Pre-oxygenation with 100% oxygen Intubation Type: IV induction, Rapid sequence and Cricoid Pressure applied Ventilation: Mask ventilation without difficulty Laryngoscope Size: Mac and 3 Grade View: Grade II Tube type: Oral Tube size: 7.0 mm Number of attempts: 1 Airway Equipment and Method: Stylet Placement Confirmation: ETT inserted through vocal cords under direct vision,  positive ETCO2 and breath sounds checked- equal and bilateral Secured at: 21 cm Tube secured with: Tape Dental Injury: Teeth and Oropharynx as per pre-operative assessment

## 2014-11-07 ENCOUNTER — Encounter (HOSPITAL_COMMUNITY): Payer: Self-pay | Admitting: General Surgery

## 2015-02-16 ENCOUNTER — Other Ambulatory Visit: Payer: Self-pay

## 2015-02-19 MED ORDER — PANTOPRAZOLE SODIUM 40 MG PO TBEC: 40.0000 mg | DELAYED_RELEASE_TABLET | Freq: Every day | ORAL | Status: DC

## 2015-09-28 ENCOUNTER — Other Ambulatory Visit: Payer: Self-pay | Admitting: Nurse Practitioner

## 2015-11-28 LAB — TSH: TSH: 0.08 u[IU]/mL — AB (ref <=5.90)

## 2016-04-14 ENCOUNTER — Ambulatory Visit (INDEPENDENT_AMBULATORY_CARE_PROVIDER_SITE_OTHER): Payer: Medicaid Other | Admitting: "Endocrinology

## 2016-04-14 ENCOUNTER — Encounter: Payer: Self-pay | Admitting: "Endocrinology

## 2016-04-14 ENCOUNTER — Ambulatory Visit: Payer: Self-pay | Admitting: "Endocrinology

## 2016-04-14 VITALS — BP 124/88 | HR 83 | Ht 64.0 in | Wt 230.0 lb

## 2016-04-14 DIAGNOSIS — E0590 Thyrotoxicosis, unspecified without thyrotoxic crisis or storm: Secondary | ICD-10-CM | POA: Diagnosis not present

## 2016-04-14 DIAGNOSIS — E039 Hypothyroidism, unspecified: Secondary | ICD-10-CM | POA: Insufficient documentation

## 2016-04-14 NOTE — Progress Notes (Signed)
Subjective:    Patient ID: Heather Fry, female    DOB: August 27, 1976, PCP Collene Mares, PA-C   Past Medical History  Diagnosis Date  . Depression   . Anxiety   . Uterine fibroid   . GERD (gastroesophageal reflux disease)    Past Surgical History  Procedure Laterality Date  . Dilation and curettage of uterus    . Tubal ligation    . Mouth surgery      extraction of teeth  . Esophagogastroduodenoscopy N/A 10/02/2014    Procedure: ESOPHAGOGASTRODUODENOSCOPY (EGD);  Surgeon: Daneil Dolin, MD;  Location: [facility] ENDO SUITE;  Service: Endoscopy;  Laterality: N/A;  945am - moved to 8:45 - Ginger notified pt  . Cholecystectomy N/A 11/06/2014    Procedure: LAPAROSCOPIC CHOLECYSTECTOMY;  Surgeon: Jamesetta So, MD;  Location: [facility] ORS;  Service: General;  Laterality: N/A;   Social History   Social History  . Marital Status: Married    Spouse Name: N/A  . Number of Children: N/A  . Years of Education: N/A   Social History Main Topics  . Smoking status: Former Smoker -- 1.00 packs/day for 3 years    Types: Cigarettes    Quit date: 11/03/1997  . Smokeless tobacco: None  . Alcohol Use: No  . Drug Use: No  . Sexual Activity: Yes    Birth Control/ Protection: Surgical   Other Topics Concern  . None   Social History Narrative   Outpatient Encounter Prescriptions as of 04/14/2016  Medication Sig  . Acetaminophen (TYLENOL ARTHRITIS PAIN PO) Take 2 tablets by mouth 2 (two) times daily as needed. For pain  . escitalopram (LEXAPRO) 20 MG tablet Take 20 mg by mouth at bedtime.   . Fiber CAPS Take 2 capsules by mouth daily.  . Multiple Vitamin (MULTIVITAMIN WITH MINERALS) TABS Take 1 tablet by mouth daily.  Marland Kitchen oxyCODONE-acetaminophen (PERCOCET) 7.5-325 MG per tablet Take 1-2 tablets by mouth every 4 (four) hours as needed.  . pantoprazole (PROTONIX) 40 MG tablet TAKE ONE TABLET BY MOUTH ONCE DAILY  . pseudoephedrine-acetaminophen (TYLENOL SINUS) 30-500 MG TABS Take 1 tablet by mouth  every 4 (four) hours as needed (sinus).    No facility-administered encounter medications on file as of 04/14/2016.   ALLERGIES: Allergies  Allergen Reactions  . Penicillins Other (See Comments)    Loopy "knocked out"   VACCINATION STATUS:  There is no immunization history on file for this patient.  HPI 40 year old female patient with medical history as above. She is being seen in consultation for subclinical hyperthyroidism requested by Collene Mares, PA. She was found to have suppressed TSH in February 2017 at 0.107 associated with normal free T4 of 1.07 and total T4 was 6.7/ total t3 normal at 136. -She was not given any antithyroid treatment. Repeat thyroid function test in March 2017 showed further suppression of TSH at 0.079 with normal free T4, total T4, and total T3. -Complete lab to be scanned. - She denies weight loss, heat intolerance. She reports anxiety and on and off palpitations which has resolved when she was started on a beta blocker, metoprolol 25 mg by mouth twice a day. - She denies any family history of thyroid dysfunction. She denies any personal history of goiter. She is not on any thyroid hormone supplement.  Review of Systems Constitutional: no weight gain/loss, no fatigue, no subjective hyperthermia/hypothermia Eyes: no blurry vision, no xerophthalmia ENT: no sore throat, no nodules palpated in throat, no dysphagia/odynophagia, no hoarseness Cardiovascular: no  CP/SOB/palpitations/leg swelling Respiratory: no cough/SOB Gastrointestinal: no N/V/D/C Musculoskeletal: no muscle/joint aches Skin: no rashes Neurological: no tremors/numbness/tingling/dizziness Psychiatric: no depression/anxiety Objective:    BP 124/88 mmHg  Pulse 83  Ht 5\' 4"  (1.626 m)  Wt 230 lb (104.327 kg)  BMI 39.46 kg/m2  Wt Readings from Last 3 Encounters:  04/14/16 230 lb (104.327 kg)  11/03/14 237 lb (107.502 kg)  10/02/14 234 lb (106.142 kg)    Physical Exam  .Constitutional:  overweight, in NAD Eyes: PERRLA, EOMI, no exophthalmos ENT: moist mucous membranes, no thyromegaly, no cervical lymphadenopathy Cardiovascular: RRR, No MRG Respiratory: CTA B Gastrointestinal: abdomen soft, NT, ND, BS+ Musculoskeletal: no deformities, strength intact in all 4 Skin: moist, + tattoo, warm, no rashes Neurological: no tremor with outstretched hands, DTR normal in all 4  CMP     Component Value Date/Time   NA 139 11/03/2014 1300   K 3.8 11/03/2014 1300   CL 107 11/03/2014 1300   CO2 24 11/03/2014 1300   GLUCOSE 122* 11/03/2014 1300   BUN 11 11/03/2014 1300   CREATININE 0.53 11/03/2014 1300   CALCIUM 9.7 11/03/2014 1300   PROT 7.3 11/03/2014 1300   ALBUMIN 4.0 11/03/2014 1300   AST 18 11/03/2014 1300   ALT 19 11/03/2014 1300   ALKPHOS 50 11/03/2014 1300   BILITOT 0.4 11/03/2014 1300   GFRNONAA >90 11/03/2014 1300   GFRAA >90 11/03/2014 1300   11/28/2015: TSH 0.079, free T4 1.17, total T4 7.9, and total T3 161  Assessment & Plan:   1. Subclinical hyperthyroidism  I have reviewed patient's available thyroid workup records and evaluated patient clinically. Her presenting symptoms are nonspecific. -Based on her records and presentation, she has subclinical hyperthyroidism. -However, her thyroid function tests are too old to make conclusion. I will send her to lab to get fresh set of TSH, free T4, free T3, TPO antibodies and thyroglobulin antibodies. She will return in 1-2 weeks to discuss treatment options if necessary. -She does not have clinical goiter hence no need for imaging studies. - I advised patient to maintain close follow up with Collene Mares, PA-C for primary care needs. Follow up plan: Return in about 2 weeks (around 04/28/2016) for follow up with pre-visit labs.  Glade Lloyd, MD Phone: [phone]  Fax: [phone]   04/14/2016, 3:55 PM

## 2016-04-15 LAB — THYROGLOBULIN ANTIBODY

## 2016-04-15 LAB — TSH: TSH: 0.07 u[IU]/mL — AB (ref 0.450–4.500)

## 2016-04-15 LAB — T3, FREE: T3, Free: 3.6 pg/mL (ref 2.0–4.4)

## 2016-04-15 LAB — T4, FREE: FREE T4: 0.91 ng/dL (ref 0.82–1.77)

## 2016-04-15 LAB — THYROID PEROXIDASE ANTIBODY: Thyroperoxidase Ab SerPl-aCnc: <6 IU/mL (ref 0–34)

## 2016-04-16 ENCOUNTER — Other Ambulatory Visit: Payer: Self-pay | Admitting: Gastroenterology

## 2016-04-16 ENCOUNTER — Telehealth: Payer: Self-pay | Admitting: "Endocrinology

## 2016-04-16 NOTE — Telephone Encounter (Signed)
Spoke with pt. She states she "has already got her medications straightened out"

## 2016-04-16 NOTE — Telephone Encounter (Signed)
Patient needs to speak with nurse about her medications.

## 2016-04-16 NOTE — Telephone Encounter (Signed)
Called pt. No answer. No voicemail.

## 2016-04-28 ENCOUNTER — Encounter: Payer: Self-pay | Admitting: "Endocrinology

## 2016-04-29 ENCOUNTER — Encounter: Payer: Self-pay | Admitting: "Endocrinology

## 2016-04-30 ENCOUNTER — Encounter: Payer: Self-pay | Admitting: "Endocrinology

## 2016-04-30 ENCOUNTER — Ambulatory Visit (INDEPENDENT_AMBULATORY_CARE_PROVIDER_SITE_OTHER): Payer: Medicaid Other | Admitting: "Endocrinology

## 2016-04-30 VITALS — BP 123/89 | HR 72 | Wt 228.2 lb

## 2016-04-30 DIAGNOSIS — E0590 Thyrotoxicosis, unspecified without thyrotoxic crisis or storm: Secondary | ICD-10-CM | POA: Diagnosis not present

## 2016-04-30 NOTE — Progress Notes (Signed)
Patient is scheduled for NM Uptake on 8/9 & 8/10  @ 11 AM.  -- Notified patient of scheduled appt on # [phone]. **  8/9  NPO 4hr Prior to procedure  ** 11 am she will be given the pill  -- 4hrs later-- **3 pm she will be scanned (pictures are done)  ** 8/10  @ 11 am Uptake scan ( this is where the counts are done) Takes approximately 15 min.

## 2016-04-30 NOTE — Progress Notes (Signed)
Subjective:    Patient ID: Heather Fry, female    DOB: November 09, 1975, PCP Heather Mares, PA-C   Past Medical History:  Diagnosis Date  . Anxiety   . Depression   . GERD (gastroesophageal reflux disease)   . Uterine fibroid    Past Surgical History:  Procedure Laterality Date  . CHOLECYSTECTOMY N/A 11/06/2014   Procedure: LAPAROSCOPIC CHOLECYSTECTOMY;  Surgeon: Heather So, MD;  Location: [facility];  Service: General;  Laterality: N/A;  . DILATION AND CURETTAGE OF UTERUS    . ESOPHAGOGASTRODUODENOSCOPY N/A 10/02/2014   Procedure: ESOPHAGOGASTRODUODENOSCOPY (EGD);  Surgeon: Heather Dolin, MD;  Location: [facility];  Service: Endoscopy;  Laterality: N/A;  945am - moved to 8:45 - Heather Fry notified pt  . MOUTH SURGERY     extraction of teeth  . TUBAL LIGATION     Social History   Social History  . Marital status: Married    Spouse name: N/A  . Number of children: N/A  . Years of education: N/A   Social History Main Topics  . Smoking status: Former Smoker    Packs/day: 1.00    Years: 3.00    Types: Cigarettes    Quit date: 11/03/1997  . Smokeless tobacco: None  . Alcohol use No  . Drug use: No  . Sexual activity: Yes    Birth control/ protection: Surgical   Other Topics Concern  . None   Social History Narrative  . None   Outpatient Encounter Prescriptions as of 04/30/2016  Medication Sig  . Acetaminophen (TYLENOL ARTHRITIS PAIN PO) Take 2 tablets by mouth 2 (two) times daily as needed. For pain  . escitalopram (LEXAPRO) 20 MG tablet Take 20 mg by mouth at bedtime.   . Fiber CAPS Take 2 capsules by mouth daily.  . metoprolol tartrate (LOPRESSOR) 25 MG tablet Take 25 mg by mouth 2 (two) times daily.  . Multiple Vitamin (MULTIVITAMIN WITH MINERALS) TABS Take 1 tablet by mouth daily.  . pantoprazole (PROTONIX) 40 MG tablet TAKE 1 TABLET EVERY DAY  . [DISCONTINUED] oxyCODONE-acetaminophen (PERCOCET) 7.5-325 MG per tablet Take 1-2 tablets by mouth every 4 (four) hours  as needed.  . [DISCONTINUED] pseudoephedrine-acetaminophen (TYLENOL SINUS) 30-500 MG TABS Take 1 tablet by mouth every 4 (four) hours as needed (sinus).    No facility-administered encounter medications on file as of 04/30/2016.    ALLERGIES: Allergies  Allergen Reactions  . Penicillins Other (See Comments)    Loopy "knocked out"   VACCINATION STATUS:  There is no immunization history on file for this patient.  HPI 40 year old female patient with medical history as above. She is being seen in f/u for subclinical hyperthyroidism requested by Heather Mares, PA. She was found to have suppressed TSH on several occasions. The repeat thyroid function tests also shows TSH of 0.07 along with free T4 normal at 0.91 and free T3 normal at 3.6. Her antithyroid antibodies are negative. - She has mild weight loss  of 9 pounds since February, she denies heat intolerance. She reports anxiety and on and off palpitations which has resolved when she was started on a beta blocker, metoprolol 25 mg by mouth twice a day. - She denies any family history of thyroid dysfunction. She denies any personal history of goiter. She is not on any thyroid hormone supplement.  Review of Systems Constitutional:  + weight  loss, no fatigue, no subjective hyperthermia/hypothermia Eyes: no blurry vision, no xerophthalmia ENT: no sore throat, no nodules palpated in  throat, no dysphagia/odynophagia, no hoarseness Cardiovascular: no CP/SOB/palpitations/leg swelling Respiratory: no cough/SOB Gastrointestinal: no N/V/D/C Musculoskeletal: no muscle/joint aches Skin: no rashes Neurological: no tremors/numbness/tingling/dizziness Psychiatric: no depression/anxiety Objective:    BP 123/89   Pulse 72   Wt 228 lb 4 oz (103.5 kg)   BMI 39.18 kg/m   Wt Readings from Last 3 Encounters:  04/30/16 228 lb 4 oz (103.5 kg)  04/14/16 230 lb (104.3 kg)  11/03/14 237 lb (107.5 kg)    Physical Exam  .Constitutional: overweight, in  NAD Eyes: PERRLA, EOMI, no exophthalmos ENT: moist mucous membranes, no thyromegaly, no cervical lymphadenopathy Cardiovascular: RRR, No MRG Respiratory: CTA B Gastrointestinal: abdomen soft, NT, ND, BS+ Musculoskeletal: no deformities, strength intact in all 4 Skin: moist, + tattoo, warm, no rashes Neurological: no tremor with outstretched hands, DTR normal in all 4  CMP     Component Value Date/Time   NA 139 11/03/2014 1300   K 3.8 11/03/2014 1300   CL 107 11/03/2014 1300   CO2 24 11/03/2014 1300   GLUCOSE 122 (H) 11/03/2014 1300   BUN 11 11/03/2014 1300   CREATININE 0.53 11/03/2014 1300   CALCIUM 9.7 11/03/2014 1300   PROT 7.3 11/03/2014 1300   ALBUMIN 4.0 11/03/2014 1300   AST 18 11/03/2014 1300   ALT 19 11/03/2014 1300   ALKPHOS 50 11/03/2014 1300   BILITOT 0.4 11/03/2014 1300   GFRNONAA >90 11/03/2014 1300   GFRAA >90 11/03/2014 1300   11/28/2015: TSH 0.079, free T4 1.17, total T4 7.9, and total T3 161  Results for Heather Fry (MRN [id number]) as of 04/30/2016 15:24  Ref. Range 04/14/2016 14:18  TSH Latest Ref Range: 0.450 - 4.500 uIU/mL 0.070 (L)  T4,Free(Direct) Latest Ref Range: 0.82 - 1.77 ng/dL 0.91  Triiodothyronine,Free,Serum Latest Ref Range: 2.0 - 4.4 pg/mL 3.6  Thyroperoxidase Ab SerPl-aCnc Latest Ref Range: 0 - 34 IU/mL <6  Thyroglobulin Antibody Latest Ref Range: 0.0 - 0.9 IU/mL <1.0   Assessment & Plan:   1. Subclinical hyperthyroidism -Her repeat thyroid function test Still shows suppressed TSH along with normal free T4 and free T3. -This is consistent with subclinical hyperthyroidism. She is on low-dose beta blocker which may have masked some of the symptoms. - I will proceed to obtain thyroid uptake and scan to better characterize thyroid activity.Marland Kitchen - If she has elevated uptake, she will be considered for therapy preferably ATD rather than thyroid ablation. -She does not have clinical goiter hence no need for imaging studies. - I advised patient  to maintain close follow up with Heather Mares, PA-C for primary care needs. Follow up plan: Return in about 1 week (around 05/07/2016) for thyroid uptake and scan.  Heather Lloyd, MD Phone: [phone]  Fax: [phone]   04/30/2016, 3:32 PM

## 2016-05-01 ENCOUNTER — Other Ambulatory Visit: Payer: Self-pay | Admitting: *Deleted

## 2016-05-07 ENCOUNTER — Encounter (HOSPITAL_COMMUNITY): Payer: Self-pay

## 2016-05-07 ENCOUNTER — Encounter (HOSPITAL_COMMUNITY)
Admission: RE | Admit: 2016-05-07 | Discharge: 2016-05-07 | Disposition: A | Discharge status: Home / Self Care | Payer: Medicaid Other | Source: Ambulatory Visit | Attending: "Endocrinology | Admitting: "Endocrinology

## 2016-05-07 DIAGNOSIS — E0590 Thyrotoxicosis, unspecified without thyrotoxic crisis or storm: Secondary | ICD-10-CM | POA: Diagnosis present

## 2016-05-07 IMAGING — NM NM THYROID IMAGING W/ UPTAKE SINGLE (24 HR)
4 series · 4 of 4 positions shown · non-contrast
Comparison: None

CLINICAL DATA: Hyperthyroidism

EXAM:
THYROID SCAN AND UPTAKE - 24 HOURS
TECHNIQUE: Following the per oral administration of [8W] sodium iodide, the
patient returned at 24 hours and uptake measurements were acquired
with the uptake probe centered on the neck. Thyroid imaging was
performed following the intravenous administration of the [8W]
Pertechnetate.
RADIOPHARMACEUTICALS:  270 microCuries [8W] sodium iodide orally

[Series 1: ant w marker · 3.25mm/px · 1 of 1 slices shown]
[im 1/1  full-range]
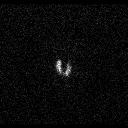

[Series 2: anterior · 3.25mm/px · 1 of 1 slices shown]
[im 1/1  full-range]
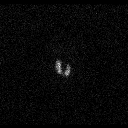

[Series 3: lao · 3.25mm/px · 1 of 1 slices shown]
[im 1/1  full-range]
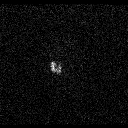

[Series 4: rao · 3.25mm/px · 1 of 1 slices shown]
[im 1/1  full-range]
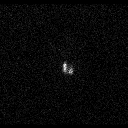

[4 of 4 positions shown; findings below may reference images not displayed]

FINDINGS: 24 hour radio iodine uptake is calculated 8%, at the lower end the
normal range.

Imaging of the thyroid gland in 3 projections demonstrates
homogeneous tracer distribution in both thyroid lobes.

No focal areas of increased or decreased tracer localization seen.
IMPRESSION: Normal thyroid scan.

Low normal 24 hour radio iodine uptake of 8%.

## 2016-05-07 MED ORDER — SODIUM IODIDE I-123 3.7 MBQ PO CAPS: 270.0000 | ORAL_CAPSULE | Freq: Once | ORAL | Status: DC

## 2016-05-08 ENCOUNTER — Other Ambulatory Visit: Payer: Self-pay | Admitting: "Endocrinology

## 2016-05-08 ENCOUNTER — Encounter (HOSPITAL_COMMUNITY)
Admission: RE | Admit: 2016-05-08 | Discharge: 2016-05-08 | Disposition: A | Discharge status: Home / Self Care | Payer: Medicaid Other | Source: Ambulatory Visit | Attending: "Endocrinology | Admitting: "Endocrinology

## 2016-05-08 DIAGNOSIS — E0590 Thyrotoxicosis, unspecified without thyrotoxic crisis or storm: Secondary | ICD-10-CM

## 2016-05-08 MED ORDER — SODIUM IODIDE I-123 7.4 MBQ PO CAPS
200.0000 | ORAL_CAPSULE | Freq: Once | ORAL | Status: DC
  Administered 2016-05-07: 270 via ORAL

## 2016-05-09 ENCOUNTER — Ambulatory Visit (INDEPENDENT_AMBULATORY_CARE_PROVIDER_SITE_OTHER): Payer: Medicaid Other | Admitting: "Endocrinology

## 2016-05-09 ENCOUNTER — Encounter: Payer: Self-pay | Admitting: "Endocrinology

## 2016-05-09 VITALS — BP 131/85 | HR 69 | Ht 64.0 in | Wt 230.0 lb

## 2016-05-09 DIAGNOSIS — E0590 Thyrotoxicosis, unspecified without thyrotoxic crisis or storm: Secondary | ICD-10-CM | POA: Diagnosis not present

## 2016-05-09 MED ORDER — METOPROLOL TARTRATE 25 MG PO TABS: 25.0000 mg | ORAL_TABLET | Freq: Every day | ORAL | 3 refills | Status: DC

## 2016-05-09 NOTE — Progress Notes (Signed)
Subjective:    Patient ID: Heather Fry, female    DOB: 1976/04/22, PCP Heather Fry., MD   Past Medical History:  Diagnosis Date  . Anxiety   . Depression   . GERD (gastroesophageal reflux disease)   . Uterine fibroid    Past Surgical History:  Procedure Laterality Date  . CHOLECYSTECTOMY N/A 11/06/2014   Procedure: LAPAROSCOPIC CHOLECYSTECTOMY;  Surgeon: Heather So, MD;  Location: [facility];  Service: General;  Laterality: N/A;  . DILATION AND CURETTAGE OF UTERUS    . ESOPHAGOGASTRODUODENOSCOPY N/A 10/02/2014   Procedure: ESOPHAGOGASTRODUODENOSCOPY (EGD);  Surgeon: Heather Dolin, MD;  Location: [facility];  Service: Endoscopy;  Laterality: N/A;  945am - moved to 8:45 - Heather Fry notified pt  . MOUTH SURGERY     extraction of teeth  . TUBAL LIGATION     Social History   Social History  . Marital status: Married    Spouse name: N/A  . Number of children: N/A  . Years of education: N/A   Social History Main Topics  . Smoking status: Former Smoker    Packs/day: 1.00    Years: 3.00    Types: Cigarettes    Quit date: 11/03/1997  . Smokeless tobacco: Never Used  . Alcohol use No  . Drug use: No  . Sexual activity: Yes    Birth control/ protection: Surgical   Other Topics Concern  . None   Social History Narrative  . None   Outpatient Encounter Prescriptions as of 05/09/2016  Medication Sig  . Acetaminophen (TYLENOL ARTHRITIS PAIN PO) Take 2 tablets by mouth 2 (two) times daily as needed. For pain  . escitalopram (LEXAPRO) 20 MG tablet Take 20 mg by mouth at bedtime.   . Fiber CAPS Take 2 capsules by mouth daily.  . metoprolol tartrate (LOPRESSOR) 25 MG tablet Take 1 tablet (25 mg total) by mouth daily.  . Multiple Vitamin (MULTIVITAMIN WITH MINERALS) TABS Take 1 tablet by mouth daily.  . pantoprazole (PROTONIX) 40 MG tablet TAKE 1 TABLET EVERY DAY  . [DISCONTINUED] metoprolol tartrate (LOPRESSOR) 25 MG tablet Take 25 mg by mouth 2 (two) times daily.   No  facility-administered encounter medications on file as of 05/09/2016.    ALLERGIES: Allergies  Allergen Reactions  . Penicillins Other (See Comments)    Loopy "knocked out"   VACCINATION STATUS:  There is no immunization history on file for this patient.  HPI 40 year old female patient with medical history as above. She is being seen in f/u for subclinical hyperthyroidism requested by Heather Mares, PA. She was found to have suppressed TSH on several occasions. The repeat thyroid function tests also shows TSH of 0.07 along with free T4 normal at 0.91 and free T3 normal at 3.6. Her antithyroid antibodies are negative. - She has mild weight loss  of 9 pounds since February, she denies heat intolerance. She reports anxiety and on and off palpitations which has resolved when she was started on a beta blocker, metoprolol 25 mg by mouth twice a day. - She denies any family history of thyroid dysfunction. She denies any personal history of goiter. She is not on any thyroid hormone supplement.  Review of Systems Constitutional:  + weight  loss, no fatigue, no subjective hyperthermia/hypothermia Eyes: no blurry vision, no xerophthalmia ENT: no sore throat, no nodules palpated in throat, no dysphagia/odynophagia, no hoarseness Cardiovascular: no CP/SOB/palpitations/leg swelling Respiratory: no cough/SOB Gastrointestinal: no N/V/D/C Musculoskeletal: no muscle/joint aches Skin: no rashes Neurological:  no tremors/numbness/tingling/dizziness Psychiatric: no depression/anxiety Objective:    BP 131/85   Pulse 69   Ht 5\' 4"  (1.626 m)   Wt 230 lb (104.3 kg)   BMI 39.48 kg/m   Wt Readings from Last 3 Encounters:  05/09/16 230 lb (104.3 kg)  04/30/16 228 lb 4 oz (103.5 kg)  04/14/16 230 lb (104.3 kg)    Physical Exam  .Constitutional: overweight, in NAD Eyes: PERRLA, EOMI, no exophthalmos ENT: moist mucous membranes, no thyromegaly, no cervical lymphadenopathy Cardiovascular: RRR, No  MRG Respiratory: CTA B Gastrointestinal: abdomen soft, NT, ND, BS+ Musculoskeletal: no deformities, strength intact in all 4 Skin: moist, + tattoo, warm, no rashes Neurological: no tremor with outstretched hands, DTR normal in all 4  CMP     Component Value Date/Time   NA 139 11/03/2014 1300   K 3.8 11/03/2014 1300   CL 107 11/03/2014 1300   CO2 24 11/03/2014 1300   GLUCOSE 122 (H) 11/03/2014 1300   BUN 11 11/03/2014 1300   CREATININE 0.53 11/03/2014 1300   CALCIUM 9.7 11/03/2014 1300   PROT 7.3 11/03/2014 1300   ALBUMIN 4.0 11/03/2014 1300   AST 18 11/03/2014 1300   ALT 19 11/03/2014 1300   ALKPHOS 50 11/03/2014 1300   BILITOT 0.4 11/03/2014 1300   GFRNONAA >90 11/03/2014 1300   GFRAA >90 11/03/2014 1300   11/28/2015: TSH 0.079, free T4 1.17, total T4 7.9, and total T3 161  Results for Heather Fry (MRN [id number]) as of 04/30/2016 15:24  Ref. Range 04/14/2016 14:18  TSH Latest Ref Range: 0.450 - 4.500 uIU/mL 0.070 (L)  T4,Free(Direct) Latest Ref Range: 0.82 - 1.77 ng/dL 0.91  Triiodothyronine,Free,Serum Latest Ref Range: 2.0 - 4.4 pg/mL 3.6  Thyroperoxidase Ab SerPl-aCnc Latest Ref Range: 0 - 34 IU/mL <6  Thyroglobulin Antibody Latest Ref Range: 0.0 - 0.9 IU/mL <1.0   Assessment & Plan:   1. Subclinical hyperthyroidism -Her repeat thyroid function test Still shows suppressed TSH along with normal free T4 and free T3. -This is consistent with subclinical hyperthyroidism. She is on low-dose beta blocker which may have masked some of the symptoms. She has a steady weight and heart rate controlled at 68 beats per minute. - Heart thyroid uptake and scan is unremarkable at 8%. -She will not need any antithyroid therapy. -I advised her to continue low-dose metoprolol 25 mg by mouth daily. We'll repeat thyroid function test in 3 months. -She does not have clinical goiter hence no need for imaging studies. - I advised patient to maintain close follow up with Heather Fry.,  MD for primary care needs. Follow up plan: Return in about 3 months (around 08/09/2016) for follow up with pre-visit labs.  Heather Lloyd, MD Phone: [phone]  Fax: [phone]   05/09/2016, 11:21 AM

## 2016-08-05 ENCOUNTER — Other Ambulatory Visit: Payer: Self-pay | Admitting: "Endocrinology

## 2016-08-06 LAB — TSH+FREE T4
Free T4: 0.88 ng/dL (ref 0.82–1.77)
TSH: 0.082 u[IU]/mL — ABNORMAL LOW (ref 0.450–4.500)

## 2016-08-12 ENCOUNTER — Encounter: Payer: Self-pay | Admitting: "Endocrinology

## 2016-08-12 ENCOUNTER — Ambulatory Visit (INDEPENDENT_AMBULATORY_CARE_PROVIDER_SITE_OTHER): Payer: Medicaid Other | Admitting: "Endocrinology

## 2016-08-12 VITALS — BP 128/90 | HR 94 | Wt 231.0 lb

## 2016-08-12 DIAGNOSIS — E0590 Thyrotoxicosis, unspecified without thyrotoxic crisis or storm: Secondary | ICD-10-CM | POA: Diagnosis not present

## 2016-08-12 MED ORDER — METOPROLOL TARTRATE 25 MG PO TABS: 25.0000 mg | ORAL_TABLET | Freq: Two times a day (BID) | ORAL | 6 refills | Status: DC

## 2016-08-12 NOTE — Progress Notes (Signed)
Subjective:    Patient ID: Heather Fry, female    DOB: 05/19/1976, PCP Heather Fry., MD   Past Medical History:  Diagnosis Date  . Anxiety   . Depression   . GERD (gastroesophageal reflux disease)   . Uterine fibroid    Past Surgical History:  Procedure Laterality Date  . CHOLECYSTECTOMY N/A 11/06/2014   Procedure: LAPAROSCOPIC CHOLECYSTECTOMY;  Surgeon: Heather So, MD;  Location: [facility];  Service: General;  Laterality: N/A;  . DILATION AND CURETTAGE OF UTERUS    . ESOPHAGOGASTRODUODENOSCOPY N/A 10/02/2014   Procedure: ESOPHAGOGASTRODUODENOSCOPY (EGD);  Surgeon: Heather Dolin, MD;  Location: [facility];  Service: Endoscopy;  Laterality: N/A;  945am - moved to 8:45 - Heather Fry notified pt  . MOUTH SURGERY     extraction of teeth  . TUBAL LIGATION     Social History   Social History  . Marital status: Married    Spouse name: N/A  . Number of children: N/A  . Years of education: N/A   Social History Main Topics  . Smoking status: Former Smoker    Packs/day: 1.00    Years: 3.00    Types: Cigarettes    Quit date: 11/03/1997  . Smokeless tobacco: Never Used  . Alcohol use No  . Drug use: No  . Sexual activity: Yes    Birth control/ protection: Surgical   Other Topics Concern  . None   Social History Narrative  . None   Outpatient Encounter Prescriptions as of 08/12/2016  Medication Sig  . Acetaminophen (TYLENOL ARTHRITIS PAIN PO) Take 2 tablets by mouth 2 (two) times daily as needed. For pain  . escitalopram (LEXAPRO) 20 MG tablet Take 20 mg by mouth at bedtime.   . Fiber CAPS Take 2 capsules by mouth daily.  . metoprolol tartrate (LOPRESSOR) 25 MG tablet Take 1 tablet (25 mg total) by mouth 2 (two) times daily.  . Multiple Vitamin (MULTIVITAMIN WITH MINERALS) TABS Take 1 tablet by mouth daily.  . pantoprazole (PROTONIX) 40 MG tablet TAKE 1 TABLET EVERY DAY  . [DISCONTINUED] metoprolol tartrate (LOPRESSOR) 25 MG tablet Take 1 tablet (25 mg total) by  mouth daily.   No facility-administered encounter medications on file as of 08/12/2016.    ALLERGIES: Allergies  Allergen Reactions  . Penicillins Other (See Comments)    Loopy "knocked out"   VACCINATION STATUS:  There is no immunization history on file for this patient.  HPI 40 year old female patient with medical history as above. She is being seen in f/u for subclinical hyperthyroidism.  She was found to have suppressed TSH on several occasions. The repeat thyroid function tests  Still show Suppressed TSH of 0.08 along with free T4 of 0.8 . - She denies new complaints. She has heavy menstrual flow due to fibroids following up with her OB/GYN. - He has steady weight. Since last visit. she denies heat intolerance. She reports anxiety and on and off palpitations which has resolved when she was started on a beta blocker, metoprolol 25 mg by mouth once a day. - She denies any family history of thyroid dysfunction. She denies any personal history of goiter. She is not on any thyroid hormone supplement.  Review of Systems Constitutional:  +steady  weight, no fatigue, no subjective hyperthermia/hypothermia Eyes: no blurry vision, no xerophthalmia ENT: no sore throat, no nodules palpated in throat, no dysphagia/odynophagia, no hoarseness Cardiovascular: no CP/SOB/palpitations/leg swelling Respiratory: no cough/SOB Gastrointestinal: no N/V/D/C Musculoskeletal: no muscle/joint aches Skin:  no rashes Neurological: no tremors/numbness/tingling/dizziness Psychiatric: no depression/anxiety Objective:    BP 128/90   Pulse 94   Wt 231 lb (104.8 kg)   SpO2 99%   BMI 39.65 kg/m   Wt Readings from Last 3 Encounters:  08/12/16 231 lb (104.8 kg)  05/09/16 230 lb (104.3 kg)  04/30/16 228 lb 4 oz (103.5 kg)    Physical Exam  .Constitutional: overweight, in NAD Eyes: PERRLA, EOMI, no exophthalmos ENT: moist mucous membranes, no thyromegaly, no cervical lymphadenopathy Cardiovascular:  RRR, No MRG Respiratory: CTA B Gastrointestinal: abdomen soft, NT, ND, BS+ Musculoskeletal: no deformities, strength intact in all 4 Skin: moist, + tattoo, warm, no rashes Neurological: no tremor with outstretched hands, DTR normal in all 4  CMP     Component Value Date/Time   NA 139 11/03/2014 1300   K 3.8 11/03/2014 1300   CL 107 11/03/2014 1300   CO2 24 11/03/2014 1300   GLUCOSE 122 (H) 11/03/2014 1300   BUN 11 11/03/2014 1300   CREATININE 0.53 11/03/2014 1300   CALCIUM 9.7 11/03/2014 1300   PROT 7.3 11/03/2014 1300   ALBUMIN 4.0 11/03/2014 1300   AST 18 11/03/2014 1300   ALT 19 11/03/2014 1300   ALKPHOS 50 11/03/2014 1300   BILITOT 0.4 11/03/2014 1300   GFRNONAA >90 11/03/2014 1300   GFRAA >90 11/03/2014 1300   Results for Heather, Fry (MRN [id number]) as of 08/12/2016 14:24  Ref. Range 04/14/2016 14:18 05/08/2016 15:17 08/05/2016 13:11  TSH Latest Ref Range: 0.450 - 4.500 uIU/mL 0.070 (L)  0.082 (L)  Triiodothyronine,Free,Serum Latest Ref Range: 2.0 - 4.4 pg/mL 3.6    T4,Free(Direct) Latest Ref Range: 0.82 - 1.77 ng/dL 0.91  0.88  Thyroperoxidase Ab SerPl-aCnc Latest Ref Range: 0 - 34 IU/mL <6    Thyroglobulin Antibody Latest Ref Range: 0.0 - 0.9 IU/mL <1.0      Assessment & Plan:   1. Subclinical hyperthyroidism -Her repeat thyroid function test Still shows suppressed TSH along with normal free T4  of low normal at 0.8 . -This is still  consistent with subclinical hyperthyroidism. - Her pulse rate is  94, may benefit from increased dose of metoprolol. I advised her to increase metoprolol to 25 mg by mouth twice a day.  - Her prior thyroid uptake and scan is unremarkable at 8%. -She will not need  thyroid ablative therapy.   -She does not have clinical goiter hence no need for imaging studies. - Her thyroid function test is stable enough for her to have her fibroid surgery. She will return in 6 months with repeat thyroid function tests.  - I advised patient to  maintain close follow up with Heather Fry., MD for primary care needs. Follow up plan: Return in about 6 months (around 02/09/2017) for follow up with pre-visit labs.  Heather Lloyd, MD Phone: [phone]  Fax: [phone]   08/12/2016, 2:40 PM

## 2016-12-08 ENCOUNTER — Other Ambulatory Visit: Payer: Self-pay | Admitting: Nurse Practitioner

## 2016-12-17 ENCOUNTER — Other Ambulatory Visit: Payer: Self-pay

## 2016-12-17 NOTE — Telephone Encounter (Signed)
Pt has an appointment on 01/06/17 @ 8:30 am. She would like to know if she could get a RX refill until her appointment.

## 2016-12-18 MED ORDER — PANTOPRAZOLE SODIUM 40 MG PO TBEC: 40.0000 mg | DELAYED_RELEASE_TABLET | Freq: Every day | ORAL | 0 refills | Status: DC

## 2016-12-30 ENCOUNTER — Encounter: Payer: Self-pay | Admitting: "Endocrinology

## 2016-12-30 ENCOUNTER — Ambulatory Visit (INDEPENDENT_AMBULATORY_CARE_PROVIDER_SITE_OTHER): Payer: Self-pay | Admitting: "Endocrinology

## 2016-12-30 VITALS — BP 121/83 | HR 82 | Ht 64.0 in | Wt 237.0 lb

## 2016-12-30 DIAGNOSIS — E039 Hypothyroidism, unspecified: Secondary | ICD-10-CM

## 2016-12-30 MED ORDER — LEVOTHYROXINE SODIUM 25 MCG PO TABS: 25.0000 ug | ORAL_TABLET | Freq: Every day | ORAL | 2 refills | Status: DC

## 2016-12-30 NOTE — Progress Notes (Signed)
Subjective:    Patient ID: Heather Fry, female    DOB: 11/08/1975, PCP Glo Herring, MD   Past Medical History:  Diagnosis Date  . Anxiety   . Depression   . GERD (gastroesophageal reflux disease)   . Uterine fibroid    Past Surgical History:  Procedure Laterality Date  . CHOLECYSTECTOMY N/A 11/06/2014   Procedure: LAPAROSCOPIC CHOLECYSTECTOMY;  Surgeon: Jamesetta So, MD;  Location: [facility];  Service: General;  Laterality: N/A;  . DILATION AND CURETTAGE OF UTERUS    . ESOPHAGOGASTRODUODENOSCOPY N/A 10/02/2014   Procedure: ESOPHAGOGASTRODUODENOSCOPY (EGD);  Surgeon: Daneil Dolin, MD;  Location: [facility];  Service: Endoscopy;  Laterality: N/A;  945am - moved to 8:45 - Ginger notified pt  . MOUTH SURGERY     extraction of teeth  . TUBAL LIGATION     Social History   Social History  . Marital status: Married    Spouse name: N/A  . Number of children: N/A  . Years of education: N/A   Social History Main Topics  . Smoking status: Former Smoker    Packs/day: 1.00    Years: 3.00    Types: Cigarettes    Quit date: 11/03/1997  . Smokeless tobacco: Never Used  . Alcohol use No  . Drug use: No  . Sexual activity: Yes    Birth control/ protection: Surgical   Other Topics Concern  . None   Social History Narrative  . None   Outpatient Encounter Prescriptions as of 12/30/2016  Medication Sig  . Acetaminophen (TYLENOL ARTHRITIS PAIN PO) Take 2 tablets by mouth 2 (two) times daily as needed. For pain  . escitalopram (LEXAPRO) 20 MG tablet Take 20 mg by mouth at bedtime.   . Fiber CAPS Take 2 capsules by mouth daily.  Marland Kitchen levothyroxine (SYNTHROID, LEVOTHROID) 25 MCG tablet Take 1 tablet (25 mcg total) by mouth daily before breakfast.  . metoprolol tartrate (LOPRESSOR) 25 MG tablet Take 1 tablet (25 mg total) by mouth 2 (two) times daily.  . Multiple Vitamin (MULTIVITAMIN WITH MINERALS) TABS Take 1 tablet by mouth daily.  . pantoprazole (PROTONIX) 40 MG tablet Take 1  tablet (40 mg total) by mouth daily.   No facility-administered encounter medications on file as of 12/30/2016.    ALLERGIES: Allergies  Allergen Reactions  . Penicillins Other (See Comments)    Loopy "knocked out"   VACCINATION STATUS:  There is no immunization history on file for this patient.  HPI 41 year old female patient with medical history as above. She is being seen in f/u for Thyroid dysfunction with repeat thyroid function tests.  She was found to have suppressed TSH on several occasions. The repeat thyroid function tests now shows that her free T4 is also suppressed at 0.75 (normal 0.8 2-1.77).  - She  complains of weight gain. She is status post partial hysterectomy for symptomatic fibroids. She reports anxiety and on and off palpitations which has resolved when she was started on a beta blocker, metoprolol 25 mg by mouth once a day. - She denies any family history of thyroid dysfunction. She denies any personal history of goiter. She is not on any thyroid hormone supplement.  Review of Systems Constitutional:  + weight gain, no fatigue, no subjective hyperthermia/hypothermia Eyes: no blurry vision, no xerophthalmia ENT: no sore throat, no nodules palpated in throat, no dysphagia/odynophagia, no hoarseness Cardiovascular: no CP/SOB/palpitations/leg swelling Respiratory: no cough/SOB Gastrointestinal: no N/V/D/C Musculoskeletal: no muscle/joint aches Skin: no rashes  Neurological: no tremors/numbness/tingling/dizziness Psychiatric: no depression/anxiety Objective:    BP 121/83   Pulse 82   Ht 5\' 4"  (1.626 m)   Wt 237 lb (107.5 kg)   BMI 40.68 kg/m   Wt Readings from Last 3 Encounters:  12/30/16 237 lb (107.5 kg)  08/12/16 231 lb (104.8 kg)  05/09/16 230 lb (104.3 kg)    Physical Exam  .Constitutional: overweight, in NAD Eyes: PERRLA, EOMI, no exophthalmos ENT: moist mucous membranes, no thyromegaly, no cervical lymphadenopathy Cardiovascular: RRR, No  MRG Respiratory: CTA B Gastrointestinal: abdomen soft, NT, ND, BS+ Musculoskeletal: no deformities, strength intact in all 4 Skin: moist, + tattoo, warm, no rashes Neurological: no tremor with outstretched hands, DTR normal in all 4  CMP     Component Value Date/Time   NA 139 11/03/2014 1300   K 3.8 11/03/2014 1300   CL 107 11/03/2014 1300   CO2 24 11/03/2014 1300   GLUCOSE 122 (H) 11/03/2014 1300   BUN 11 11/03/2014 1300   CREATININE 0.53 11/03/2014 1300   CALCIUM 9.7 11/03/2014 1300   PROT 7.3 11/03/2014 1300   ALBUMIN 4.0 11/03/2014 1300   AST 18 11/03/2014 1300   ALT 19 11/03/2014 1300   ALKPHOS 50 11/03/2014 1300   BILITOT 0.4 11/03/2014 1300   GFRNONAA >90 11/03/2014 1300   GFRAA >90 11/03/2014 1300   Labs from 12/23/2016 showed free T4 0.75 (normal 0.8 2-1.77) TSH 0.16 EKG was normal sinus  Assessment & Plan:   1.  Secondary hypothyroidism -Her repeat thyroid function test now forever secondary hypothyroidism with suppressed TSH and suppressed free T4. Given her clinical symptoms including significant weight gain she would benefit from early initiation of thyroid hormone replacement.   - I will initiate levothyroxine 25 g by mouth every morning.   - We discussed about correct intake of levothyroxine, at fasting, with water, separated by at least 30 minutes from breakfast, and separated by more than 4 hours from calcium, iron, multivitamins, acid reflux medications (PPIs). -Patient is made aware of the fact that thyroid hormone replacement is needed for life, dose to be adjusted by periodic monitoring of thyroid function tests. - Her prior thyroid uptake and scan is unremarkable at 8%. - I advised her to continue metoprolol 25 mg by mouth twice a day for now, her pulse rate is better at 82.  -She does not have clinical goiter hence no need for imaging studies. - She will return in 8 weeks with repeat thyroid function tests.  - I advised patient to maintain close  follow up with Glo Herring, MD for primary care needs. Follow up plan: Return in about 8 weeks (around 02/24/2017) for follow up with pre-visit labs.  Glade Lloyd, MD Phone: [phone]  Fax: [phone]   12/30/2016, 3:00 PM

## 2017-01-06 ENCOUNTER — Ambulatory Visit: Payer: Medicaid Other | Admitting: Gastroenterology

## 2017-01-28 ENCOUNTER — Other Ambulatory Visit: Payer: Self-pay

## 2017-01-28 ENCOUNTER — Ambulatory Visit (INDEPENDENT_AMBULATORY_CARE_PROVIDER_SITE_OTHER): Payer: Self-pay | Admitting: Gastroenterology

## 2017-01-28 ENCOUNTER — Encounter: Payer: Self-pay | Admitting: Gastroenterology

## 2017-01-28 DIAGNOSIS — K625 Hemorrhage of anus and rectum: Secondary | ICD-10-CM

## 2017-01-28 DIAGNOSIS — K219 Gastro-esophageal reflux disease without esophagitis: Secondary | ICD-10-CM | POA: Insufficient documentation

## 2017-01-28 DIAGNOSIS — K594 Anal spasm: Secondary | ICD-10-CM | POA: Insufficient documentation

## 2017-01-28 MED ORDER — HYOSCYAMINE SULFATE 0.125 MG SL SUBL: 0.1250 mg | SUBLINGUAL_TABLET | SUBLINGUAL | 0 refills | Status: DC | PRN

## 2017-01-28 MED ORDER — OMEPRAZOLE 20 MG PO TBEC: 20.0000 mg | DELAYED_RELEASE_TABLET | Freq: Every day | ORAL | 5 refills | Status: DC

## 2017-01-28 MED ORDER — NA SULFATE-K SULFATE-MG SULF 17.5-3.13-1.6 GM/177ML PO SOLN: 1.0000 | ORAL | 0 refills | Status: DC

## 2017-01-28 NOTE — Progress Notes (Signed)
Primary Care Physician:  Glo Herring, MD  Primary Gastroenterologist:  Garfield Cornea, MD   Chief Complaint  Patient presents with  . Abdominal Pain    HPI:  Heather Fry is a 41 y.o. female here for further evaluation of GERD, abdominal pain. Last seen in 09/2014 at time of EGD for epigastric/ruq pain, gerd. Esophagus appeared normal. She has a 3 cm hiatal hernia, multiple 2-3 mm pedunculated polyps in the gastric body/fundus. Biopsy confirmed fundic gland polyp. Patient ultimately underwent cholecystectomy the following month because of enlarging gallbladder polyps and biliary type symptoms. 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 Patient states for the most part that pain improved after her surgery. Last year she reports having partial hysterectomy for symptomatic fibroids. She then developed issues with her thyroid. Initially subclinical hyperthyroidism but now requiring levothyroxine.  Patient complains of a couple months of refractory reflux. Continues to take pantoprazole daily. Denies dysphagia. Does have sensation of lump in the third at times but not bad. Bowel movement 1-2 daily anywhere from [facility] 1 through 7. Some mild left sided/left upper quadrant pain which is relieved with passage of gas. She complains of severe rectal spasms which are intermittent, unpredictable, last for several minutes at that time. Not necessarily related with a bowel movement. She also has intermittent rectal bleeding which she feels is related to hemorrhoids but has never had a colonoscopy. Family history significant for paternal aunt, colon cancer diagnosed age 78, advanced stage.   Current Outpatient Prescriptions  Medication Sig Dispense Refill  . Acetaminophen (TYLENOL ARTHRITIS PAIN PO) Take 2 tablets by mouth 2 (two) times daily as needed. For pain    . escitalopram (LEXAPRO) 20 MG tablet Take 20 mg by mouth at bedtime.     . Fiber CAPS Take 2 capsules by mouth  daily.    Marland Kitchen levothyroxine (SYNTHROID, LEVOTHROID) 25 MCG tablet Take 1 tablet (25 mcg total) by mouth daily before breakfast. 30 tablet 2  . metoprolol tartrate (LOPRESSOR) 25 MG tablet Take 1 tablet (25 mg total) by mouth 2 (two) times daily. 60 tablet 6  . Multiple Vitamin (MULTIVITAMIN WITH MINERALS) TABS Take 1 tablet by mouth daily.    . pantoprazole (PROTONIX) 40 MG tablet Take 1 tablet (40 mg total) by mouth daily. 90 tablet 0   No current facility-administered medications for this visit.     Allergies as of 01/28/2017 - Review Complete 01/28/2017  Allergen Reaction Noted  . Penicillins Other (See Comments) 04/06/2012    Past Medical History:  Diagnosis Date  . Anxiety   . Depression   . GERD (gastroesophageal reflux disease)   . Uterine fibroid     Past Surgical History:  Procedure Laterality Date  . ABDOMINAL HYSTERECTOMY     Per patient, uterine fibroids.  . CHOLECYSTECTOMY N/A 11/06/2014   Procedure: LAPAROSCOPIC CHOLECYSTECTOMY;  Surgeon: Jamesetta So, MD;  Location: [facility];  Service: General;  Laterality: N/A;  . DILATION AND CURETTAGE OF UTERUS    . ESOPHAGOGASTRODUODENOSCOPY N/A 10/02/2014   Procedure: ESOPHAGOGASTRODUODENOSCOPY (EGD);  Surgeon: Daneil Dolin, MD;  Location: [facility];  Service: Endoscopy;  Laterality: N/A;  945am - moved to 8:45 - Ginger notified pt  . MOUTH SURGERY     extraction of teeth  . TUBAL LIGATION      Family History  Problem Relation Age of Onset  . Hypertension Father   . Diabetes Father   . Depression Other   . Uterine cancer Mother     ?  cervical cancer  . Uterine cancer Sister     suicide  . Colon cancer Paternal Aunt 31    Social History   Social History  . Marital status: Married    Spouse name: N/A  . Number of children: N/A  . Years of education: N/A   Occupational History  . Not on file.   Social History Main Topics  . Smoking status: Former Smoker    Packs/day: 1.00    Years: 3.00    Types:  Cigarettes    Quit date: 11/03/1997  . Smokeless tobacco: Never Used  . Alcohol use No  . Drug use: No  . Sexual activity: Yes    Birth control/ protection: Surgical   Other Topics Concern  . Not on file   Social History Narrative  . No narrative on file      ROS:  General: Negative for anorexia, weight loss, fever, chills, fatigue, weakness. Eyes: Negative for vision changes.  ENT: Negative for hoarseness, difficulty swallowing , nasal congestion. CV: Negative for chest pain, angina, palpitations, dyspnea on exertion, peripheral edema.  Respiratory: Negative for dyspnea at rest, dyspnea on exertion, cough, sputum, wheezing.  GI: See history of present illness. GU:  Negative for dysuria, hematuria, urinary incontinence, urinary frequency, nocturnal urination.  MS: Negative for joint pain, low back pain.  Derm: Negative for rash or itching.  Neuro: Negative for weakness, abnormal sensation, seizure, frequent headaches, memory loss, confusion.  Psych: Negative for anxiety, depression, suicidal ideation, hallucinations.  Endo: Negative for unusual weight change.  Heme: Negative for bruising or bleeding. Allergy: Negative for rash or hives.    Physical Examination:  BP 125/86   Pulse 79   Temp 98.4 F (36.9 C) (Oral)   Ht 5\' 4"  (1.626 m)   Wt 236 lb 9.6 oz (107.3 kg)   BMI 40.61 kg/m    General: Well-nourished, well-developed in no acute distress.  Head: Normocephalic, atraumatic.   Eyes: Conjunctiva pink, no icterus. Mouth: Oropharyngeal mucosa moist and pink , no lesions erythema or exudate. Neck: Supple without thyromegaly, masses, or lymphadenopathy.  Lungs: Clear to auscultation bilaterally.  Heart: Regular rate and rhythm, no murmurs rubs or gallops.  Abdomen: Bowel sounds are normal, nontender, nondistended, no hepatosplenomegaly or masses, no abdominal bruits, no rebound or guarding.  Rectus diastases. Small easily reducible umbilical hernia. Nontender. Rectal:  Deferred Extremities: No lower extremity edema. No clubbing or deformities.  Neuro: Alert and oriented x 4 , grossly normal neurologically.  Skin: Warm and dry, no rash or jaundice.   Psych: Alert and cooperative, normal mood and affect.  Labs: Labs from March 2018. Glucose 123, creatinine 0.53, total bilirubin less than 0.2, alkaline phosphatase 57, AST 17.9, ALT 18, albumin 4, white blood cell count 9600, hemoglobin 13.9, platelets 266,000.  Imaging Studies: No results found.

## 2017-01-28 NOTE — Patient Instructions (Signed)
1. Stop pantoprazole. Begin omeprazole 20 mg 30 minutes before your first meld the day. Prescription sent to your pharmacy. 2. Trial of Levsin sublingual up to 4 times daily for rectal spasm or abdominal spasm. Hold for constipation. Prescription sent to your pharmacy. 3. Make sure you drink enough liquids to keep your urine light yellow. Daily exercise as tolerated. This will help with constipation and keeping bowels regular. 4. Colonoscopy in the near future. Please see separate instructions.   Proctalgia Fugax Proctalgia fugax is a condition that involves very short episodes of intense pain in the rectum. The rectum is the last part of the large intestine. The pain can last from seconds to minutes. Episodes often occur during the night and awaken the person from sleep. This condition is not a sign of cancer, but your health care provider may want to rule out a number of other conditions. What are the causes? The cause of this condition is not known. One possible cause may be spasm of the pelvic muscles or of the lowest part of the large intestine. What are the signs or symptoms? The only symptom of this condition is rectal pain.  The pain may be intense or severe.  The pain may last for only a few seconds or it may last up to 30 minutes.  The pain may occur at night and wake you up from sleep. How is this diagnosed? This condition may be diagnosed by ruling out other problems that could cause the pain. Diagnosis may include:  Medical history and physical exam.  Various tests, such as:  Anoscopy. In this test, a lighted scope is put into the rectum to look for abnormalities.  Barium enema. In this test, X-rays are taken after a white chalky substance called barium is put into the colon. The barium makes it easier to see problems because it shows up well on the X-rays.  Blood tests to rule out infections or other problems. How is this treated? There is no specific treatment to cure  this condition. Treatment options may include:  Medicines.  Warm baths.  Relaxation techniques.  Gentle massage of the painful area.  Biofeedback. Follow these instructions at home:  Take over-the-counter and prescription medicines only as told by your health care provider.  Follow instructions from your health care provider about diet.  Follow instructions from your health care provider about rest and physical activity.  Try warm baths, massaging the area, or progressive relaxation techniques as told by your health care provider.  Keep all follow-up visits as told by your health care provider. This is important. Contact a health care provider if:  You develop new symptoms.  Your pain does not get better as soon as it usually does. This information is not intended to replace advice given to you by your health care provider. Make sure you discuss any questions you have with your health care provider. Document Released: 06/10/2001 Document Revised: 02/21/2016 Document Reviewed: 12/11/2014 Elsevier Interactive Patient Education  2017 [employer].

## 2017-02-01 NOTE — Assessment & Plan Note (Addendum)
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 Also with some Left sided abd pain relieved with passage of gas/stool. Trial of levsin SL prn.   No prior colonoscopy. Given rectal pain and brbpr, plan for colonoscopy in near future.  I have discussed the risks, alternatives, benefits with regards to but not limited to the risk of reaction to medication, bleeding, infection, perforation and the patient is agreeable to proceed. Written consent to be obtained.

## 2017-02-01 NOTE — Assessment & Plan Note (Signed)
Refractory GERD, trial of change in PPI from pantoprazole to omeprazole. Reinforced antireflux measures.

## 2017-02-02 NOTE — Progress Notes (Signed)
cc'ed to pcp °

## 2017-02-05 ENCOUNTER — Other Ambulatory Visit: Payer: Self-pay | Admitting: "Endocrinology

## 2017-02-05 LAB — T4, FREE: Free T4: 1.4 ng/dL (ref 0.8–1.8)

## 2017-02-05 LAB — TSH: TSH: 0.01 mIU/L — ABNORMAL LOW

## 2017-02-12 ENCOUNTER — Encounter: Payer: Self-pay | Admitting: "Endocrinology

## 2017-02-12 ENCOUNTER — Ambulatory Visit (INDEPENDENT_AMBULATORY_CARE_PROVIDER_SITE_OTHER): Payer: Self-pay | Admitting: "Endocrinology

## 2017-02-12 VITALS — BP 118/81 | HR 85 | Ht 64.0 in | Wt 238.0 lb

## 2017-02-12 DIAGNOSIS — E039 Hypothyroidism, unspecified: Secondary | ICD-10-CM

## 2017-02-12 MED ORDER — LEVOTHYROXINE SODIUM 25 MCG PO TABS: 25.0000 ug | ORAL_TABLET | Freq: Every day | ORAL | 6 refills | Status: DC

## 2017-02-12 NOTE — Progress Notes (Signed)
Subjective:    Patient ID: Heather Fry, female    DOB: 1975/10/10, PCP Elfredia Nevins, MD   Past Medical History:  Diagnosis Date  . Anxiety   . Depression   . GERD (gastroesophageal reflux disease)   . Uterine fibroid    Past Surgical History:  Procedure Laterality Date  . ABDOMINAL HYSTERECTOMY     Per patient, uterine fibroids.  . CHOLECYSTECTOMY N/A 11/06/2014   Procedure: LAPAROSCOPIC CHOLECYSTECTOMY;  Surgeon: Dalia Heading, MD;  Location: [facility];  Service: General;  Laterality: N/A;  . DILATION AND CURETTAGE OF UTERUS    . ESOPHAGOGASTRODUODENOSCOPY N/A 10/02/2014   Procedure: ESOPHAGOGASTRODUODENOSCOPY (EGD);  Surgeon: Corbin Ade, MD;  Location: [facility];  Service: Endoscopy;  Laterality: N/A;  945am - moved to 8:45 - Ginger notified pt  . MOUTH SURGERY     extraction of teeth  . TUBAL LIGATION     Social History   Social History  . Marital status: Married    Spouse name: N/A  . Number of children: N/A  . Years of education: N/A   Social History Main Topics  . Smoking status: Former Smoker    Packs/day: 1.00    Years: 3.00    Types: Cigarettes    Quit date: 11/03/1997  . Smokeless tobacco: Never Used  . Alcohol use No  . Drug use: No  . Sexual activity: Yes    Birth control/ protection: Surgical   Other Topics Concern  . None   Social History Narrative  . None   Outpatient Encounter Prescriptions as of 02/12/2017  Medication Sig  . Acetaminophen (TYLENOL ARTHRITIS PAIN PO) Take 2 tablets by mouth 2 (two) times daily as needed. For pain  . escitalopram (LEXAPRO) 20 MG tablet Take 20 mg by mouth at bedtime.   . Fiber CAPS Take 2 capsules by mouth daily.  . hyoscyamine (LEVSIN SL) 0.125 MG SL tablet Place 1 tablet (0.125 mg total) under the tongue every 4 (four) hours as needed.  Marland Kitchen levothyroxine (SYNTHROID, LEVOTHROID) 25 MCG tablet Take 1 tablet (25 mcg total) by mouth daily before breakfast.  . metoprolol tartrate (LOPRESSOR) 25 MG tablet  Take 1 tablet (25 mg total) by mouth 2 (two) times daily.  . Multiple Vitamin (MULTIVITAMIN WITH MINERALS) TABS Take 1 tablet by mouth daily.  . Na Sulfate-K Sulfate-Mg Sulf (SUPREP BOWEL PREP KIT) 17.5-3.13-1.6 GM/180ML SOLN Take 1 kit by mouth as directed.  . Omeprazole 20 MG TBEC Take 1 tablet (20 mg total) by mouth daily before breakfast.  . [DISCONTINUED] levothyroxine (SYNTHROID, LEVOTHROID) 25 MCG tablet Take 1 tablet (25 mcg total) by mouth daily before breakfast.   No facility-administered encounter medications on file as of 02/12/2017.    ALLERGIES: Allergies  Allergen Reactions  . Penicillins Other (See Comments)    Loopy "knocked out"   VACCINATION STATUS:  There is no immunization history on file for this patient.  HPI 41 year old female patient with medical history as above. She is being seen in f/u for Thyroid dysfunction with repeat thyroid function tests.  She was found to have suppressed TSH on several occasions, Associated with low free T4 of 0.75 consistent with secondary hypothyroidism.   - She was initiated on levothyroxine 25 mg by mouth every morning, correcting free T4 to 1. 4 with clinical improvement.    - She has a steady weight, no new complaints.   She is status post partial hysterectomy for symptomatic fibroids. She reports anxiety and  on and off palpitations which has resolved when she was started on a beta blocker, metoprolol 25 mg by mouth once a day. - She denies any family history of thyroid dysfunction. She denies any personal history of goiter. She is not on any thyroid hormone supplement.  Review of Systems Constitutional:  + steady weight , no fatigue, no subjective hyperthermia/hypothermia Eyes: no blurry vision, no xerophthalmia ENT: no sore throat, no nodules palpated in throat, no dysphagia/odynophagia, no hoarseness Cardiovascular: no CP/SOB/palpitations/leg swelling Respiratory: no cough/SOB Gastrointestinal: no  N/V/D/C Musculoskeletal: no muscle/joint aches Skin: no rashes Neurological: no tremors/numbness/tingling/dizziness Psychiatric: no depression/anxiety  Objective:    BP 118/81   Pulse 85   Ht '5\' 4"'$  (1.626 m)   Wt 238 lb (108 kg)   BMI 40.85 kg/m   Wt Readings from Last 3 Encounters:  02/12/17 238 lb (108 kg)  01/28/17 236 lb 9.6 oz (107.3 kg)  12/30/16 237 lb (107.5 kg)    Physical Exam  .Constitutional: overweight, in NAD Eyes: PERRLA, EOMI, no exophthalmos ENT: moist mucous membranes, no thyromegaly, no cervical lymphadenopathy Cardiovascular: RRR, No MRG Respiratory: CTA B Gastrointestinal: abdomen soft, NT, ND, BS+ Musculoskeletal: no deformities, strength intact in all 4 Skin: moist, + tattoo, warm, no rashes Neurological: no tremor with outstretched hands, DTR normal in all 4  CMP     Component Value Date/Time   NA 139 11/03/2014 1300   K 3.8 11/03/2014 1300   CL 107 11/03/2014 1300   CO2 24 11/03/2014 1300   GLUCOSE 122 (H) 11/03/2014 1300   BUN 11 11/03/2014 1300   CREATININE 0.53 11/03/2014 1300   CALCIUM 9.7 11/03/2014 1300   PROT 7.3 11/03/2014 1300   ALBUMIN 4.0 11/03/2014 1300   AST 18 11/03/2014 1300   ALT 19 11/03/2014 1300   ALKPHOS 50 11/03/2014 1300   BILITOT 0.4 11/03/2014 1300   GFRNONAA >90 11/03/2014 1300   GFRAA >90 11/03/2014 1300    Results for JALIAH, FOODY (MRN [id number]) as of 02/12/2017 15:23  Ref. Range 02/05/2017 15:11  TSH Latest Units: mIU/L 0.01 (L)  T4,Free(Direct) Latest Ref Range: 0.8 - 1.8 ng/dL 1.4    Labs from 12/23/2016 showed free T4 0.75 (normal 0.8 2-1.77) TSH 0.16 EKG was normal sinus  Assessment & Plan:   1.  Secondary hypothyroidism - Her free T4 is 1.4 improving from 0.75. Her's is  secondary hypothyroidism.  - I will continue  levothyroxine 25 g by mouth every morning.  She will likely require higher dose on subsequent visits based on her response.   - We discussed about correct intake of  levothyroxine, at fasting, with water, separated by at least 30 minutes from breakfast, and separated by more than 4 hours from calcium, iron, multivitamins, acid reflux medications (PPIs). -Patient is made aware of the fact that thyroid hormone replacement is needed for life, dose to be adjusted by periodic monitoring of thyroid function tests. - Her prior thyroid uptake and scan is unremarkable at 8%. - I advised her to continue metoprolol 25 mg by mouth twice a day for now, her pulse rate is better at 85.  -She does not have clinical goiter hence no need for imaging studies. - She will return in 8 weeks with repeat thyroid function tests.  - I advised patient to maintain close follow up with Redmond School, MD for primary care needs. Follow up plan: Return in about 6 months (around 08/15/2017) for follow up with pre-visit labs.  Glade Lloyd, MD Phone:  [phone]  Fax: [phone]   02/12/2017, 3:33 PM

## 2017-02-24 ENCOUNTER — Ambulatory Visit: Payer: Self-pay | Admitting: "Endocrinology

## 2017-02-26 ENCOUNTER — Other Ambulatory Visit: Payer: Self-pay

## 2017-02-26 MED ORDER — LEVOTHYROXINE SODIUM 25 MCG PO TABS: 25.0000 ug | ORAL_TABLET | Freq: Every day | ORAL | 1 refills | Status: DC

## 2017-03-04 ENCOUNTER — Encounter (HOSPITAL_COMMUNITY): Payer: Self-pay | Admitting: *Deleted

## 2017-03-04 ENCOUNTER — Ambulatory Visit (HOSPITAL_COMMUNITY)
Admission: RE | Admit: 2017-03-04 | Discharge: 2017-03-04 | Disposition: A | Discharge status: Home Health | Payer: Medicaid Other | Source: Ambulatory Visit | Attending: Internal Medicine | Admitting: Internal Medicine

## 2017-03-04 ENCOUNTER — Encounter (HOSPITAL_COMMUNITY)
Admission: RE | Disposition: A | Discharge status: Home Health | Payer: Self-pay | Source: Ambulatory Visit | Attending: Internal Medicine

## 2017-03-04 DIAGNOSIS — F329 Major depressive disorder, single episode, unspecified: Secondary | ICD-10-CM | POA: Insufficient documentation

## 2017-03-04 DIAGNOSIS — K921 Melena: Secondary | ICD-10-CM

## 2017-03-04 DIAGNOSIS — Z87891 Personal history of nicotine dependence: Secondary | ICD-10-CM | POA: Insufficient documentation

## 2017-03-04 DIAGNOSIS — Z90710 Acquired absence of both cervix and uterus: Secondary | ICD-10-CM | POA: Insufficient documentation

## 2017-03-04 DIAGNOSIS — K219 Gastro-esophageal reflux disease without esophagitis: Secondary | ICD-10-CM | POA: Diagnosis not present

## 2017-03-04 DIAGNOSIS — Z800 Family history of malignant neoplasm of digestive organs: Secondary | ICD-10-CM | POA: Insufficient documentation

## 2017-03-04 DIAGNOSIS — D128 Benign neoplasm of rectum: Secondary | ICD-10-CM | POA: Diagnosis not present

## 2017-03-04 DIAGNOSIS — K621 Rectal polyp: Secondary | ICD-10-CM | POA: Diagnosis not present

## 2017-03-04 DIAGNOSIS — Z79899 Other long term (current) drug therapy: Secondary | ICD-10-CM | POA: Insufficient documentation

## 2017-03-04 DIAGNOSIS — K5730 Diverticulosis of large intestine without perforation or abscess without bleeding: Secondary | ICD-10-CM | POA: Insufficient documentation

## 2017-03-04 DIAGNOSIS — Z880 Allergy status to penicillin: Secondary | ICD-10-CM | POA: Diagnosis not present

## 2017-03-04 DIAGNOSIS — Z833 Family history of diabetes mellitus: Secondary | ICD-10-CM | POA: Diagnosis not present

## 2017-03-04 DIAGNOSIS — Z8249 Family history of ischemic heart disease and other diseases of the circulatory system: Secondary | ICD-10-CM | POA: Insufficient documentation

## 2017-03-04 DIAGNOSIS — F419 Anxiety disorder, unspecified: Secondary | ICD-10-CM | POA: Insufficient documentation

## 2017-03-04 DIAGNOSIS — K594 Anal spasm: Secondary | ICD-10-CM

## 2017-03-04 DIAGNOSIS — K640 First degree hemorrhoids: Secondary | ICD-10-CM | POA: Insufficient documentation

## 2017-03-04 DIAGNOSIS — K625 Hemorrhage of anus and rectum: Secondary | ICD-10-CM

## 2017-03-04 HISTORY — PX: POLYPECTOMY: SHX5525

## 2017-03-04 HISTORY — PX: COLONOSCOPY: SHX5424

## 2017-03-04 SURGERY — COLONOSCOPY
Anesthesia: Moderate Sedation

## 2017-03-04 MED ORDER — STERILE WATER FOR IRRIGATION IR SOLN
Status: DC | PRN
  Administered 2017-03-04: 09:00:00

## 2017-03-04 MED ORDER — SODIUM CHLORIDE 0.9 % IV SOLN
INTRAVENOUS | Status: DC
  Administered 2017-03-04: 08:00:00 via INTRAVENOUS

## 2017-03-04 MED ORDER — MIDAZOLAM HCL 5 MG/5ML IJ SOLN
INTRAMUSCULAR | Status: AC
  Filled 2017-03-04: qty 10

## 2017-03-04 MED ORDER — ONDANSETRON HCL 4 MG/2ML IJ SOLN
INTRAMUSCULAR | Status: AC
  Filled 2017-03-04: qty 2

## 2017-03-04 MED ORDER — ONDANSETRON HCL 4 MG/2ML IJ SOLN
INTRAMUSCULAR | Status: DC | PRN
  Administered 2017-03-04: 4 mg via INTRAVENOUS

## 2017-03-04 MED ORDER — PROMETHAZINE HCL 25 MG/ML IJ SOLN
25.0000 mg | Freq: Once | INTRAMUSCULAR | Status: AC
  Administered 2017-03-04: 25 mg via INTRAVENOUS

## 2017-03-04 MED ORDER — SODIUM CHLORIDE 0.9% FLUSH
INTRAVENOUS | Status: AC
  Filled 2017-03-04: qty 10

## 2017-03-04 MED ORDER — PROMETHAZINE HCL 25 MG/ML IJ SOLN
INTRAMUSCULAR | Status: AC
  Filled 2017-03-04: qty 1

## 2017-03-04 MED ORDER — MEPERIDINE HCL 100 MG/ML IJ SOLN
INTRAMUSCULAR | Status: DC | PRN
  Administered 2017-03-04: 50 mg via INTRAVENOUS
  Administered 2017-03-04 (×2): 25 mg via INTRAVENOUS

## 2017-03-04 MED ORDER — MIDAZOLAM HCL 5 MG/5ML IJ SOLN
INTRAMUSCULAR | Status: DC | PRN
  Administered 2017-03-04 (×2): 1 mg via INTRAVENOUS
  Administered 2017-03-04 (×2): 2 mg via INTRAVENOUS

## 2017-03-04 MED ORDER — MEPERIDINE HCL 100 MG/ML IJ SOLN
INTRAMUSCULAR | Status: AC
  Filled 2017-03-04: qty 2

## 2017-03-04 NOTE — Discharge Instructions (Signed)
Colonoscopy Discharge Instructions  Read the instructions outlined below and refer to this sheet in the next few weeks. These discharge instructions provide you with general information on caring for yourself after you leave the hospital. Your doctor may also give you specific instructions. While your treatment has been planned according to the most current medical practices available, unavoidable complications occasionally occur. If you have any problems or questions after discharge, call Dr. Gala Romney at [phone]. ACTIVITY  You may resume your regular activity, but move at a slower pace for the next 24 hours.   Take frequent rest periods for the next 24 hours.   Walking will help get rid of the air and reduce the bloated feeling in your belly (abdomen).   No driving for 24 hours (because of the medicine (anesthesia) used during the test).    Do not sign any important legal documents or operate any machinery for 24 hours (because of the anesthesia used during the test).  NUTRITION  Drink plenty of fluids.   You may resume your normal diet as instructed by your doctor.   Begin with a light meal and progress to your normal diet. Heavy or fried foods are harder to digest and may make you feel sick to your stomach (nauseated).   Avoid alcoholic beverages for 24 hours or as instructed.  MEDICATIONS  You may resume your normal medications unless your doctor tells you otherwise.  WHAT YOU CAN EXPECT TODAY  Some feelings of bloating in the abdomen.   Passage of more gas than usual.   Spotting of blood in your stool or on the toilet paper.  IF YOU HAD POLYPS REMOVED DURING THE COLONOSCOPY:  No aspirin products for 7 days or as instructed.   No alcohol for 7 days or as instructed.   Eat a soft diet for the next 24 hours.  FINDING OUT THE RESULTS OF YOUR TEST Not all test results are available during your visit. If your test results are not back during the visit, make an appointment  with your caregiver to find out the results. Do not assume everything is normal if you have not heard from your caregiver or the medical facility. It is important for you to follow up on all of your test results.  SEEK IMMEDIATE MEDICAL ATTENTION IF:  You have more than a spotting of blood in your stool.   Your belly is swollen (abdominal distention).   You are nauseated or vomiting.   You have a temperature over 101.   You have abdominal pain or discomfort that is severe or gets worse throughout the day.   Hemorrhoid, colonic polyp and diverticulosis information provided  Pamphlet on hemorrhoid banding provided  Begin Benefiber 1 tablespoon twice daily  Use Anusol HC cream to the anorectum as directed  Further recommendations to follow pending review of pathology report  Office visit with [place] in 6 weeks.    Hemorrhoids Hemorrhoids are swollen veins in and around the rectum or anus. There are two types of hemorrhoids:  Internal hemorrhoids. These occur in the veins that are just inside the rectum. They may poke through to the outside and become irritated and painful.  External hemorrhoids. These occur in the veins that are outside of the anus and can be felt as a painful swelling or hard lump near the anus.  Most hemorrhoids do not cause serious problems, and they can be managed with home treatments such as diet and lifestyle changes. If home treatments do not help  your symptoms, procedures can be done to shrink or remove the hemorrhoids. What are the causes? This condition is caused by increased pressure in the anal area. This pressure may result from various things, including:  Constipation.  Straining to have a bowel movement.  Diarrhea.  Pregnancy.  Obesity.  Sitting for long periods of time.  Heavy lifting or other activity that causes you to strain.  Anal sex.  What are the signs or symptoms? Symptoms of this condition include:  Pain.  Anal itching or  irritation.  Rectal bleeding.  Leakage of stool (feces).  Anal swelling.  One or more lumps around the anus.  How is this diagnosed? This condition can often be diagnosed through a visual exam. Other exams or tests may also be done, such as:  Examination of the rectal area with a gloved hand (digital rectal exam).  Examination of the anal canal using a small tube (anoscope).  A blood test, if you have lost a significant amount of blood.  A test to look inside the colon (sigmoidoscopy or colonoscopy).  How is this treated? This condition can usually be treated at home. However, various procedures may be done if dietary changes, lifestyle changes, and other home treatments do not help your symptoms. These procedures can help make the hemorrhoids smaller or remove them completely. Some of these procedures involve surgery, and others do not. Common procedures include:  Rubber band ligation. Rubber bands are placed at the base of the hemorrhoids to cut off the blood supply to them.  Sclerotherapy. Medicine is injected into the hemorrhoids to shrink them.  Infrared coagulation. A type of light energy is used to get rid of the hemorrhoids.  Hemorrhoidectomy surgery. The hemorrhoids are surgically removed, and the veins that supply them are tied off.  Stapled hemorrhoidopexy surgery. A circular stapling device is used to remove the hemorrhoids and use staples to cut off the blood supply to them.  Follow these instructions at home: Eating and drinking  Eat foods that have a lot of fiber in them, such as whole grains, beans, nuts, fruits, and vegetables. Ask your health care provider about taking products that have added fiber (fiber supplements).  Drink enough fluid to keep your urine clear or pale yellow. Managing pain and swelling  Take warm sitz baths for 20 minutes, 3-4 times a day to ease pain and discomfort.  If directed, apply ice to the affected area. Using ice packs  between sitz baths may be helpful. ? Put ice in a plastic bag. ? Place a towel between your skin and the bag. ? Leave the ice on for 20 minutes, 2-3 times a day. General instructions  Take over-the-counter and prescription medicines only as told by your health care provider.  Use medicated creams or suppositories as told.  Exercise regularly.  Go to the bathroom when you have the urge to have a bowel movement. Do not wait.  Avoid straining to have bowel movements.  Keep the anal area dry and clean. Use wet toilet paper or moist towelettes after a bowel movement.  Do not sit on the toilet for long periods of time. This increases blood pooling and pain. Contact a health care provider if:  You have increasing pain and swelling that are not controlled by treatment or medicine.  You have uncontrolled bleeding.  You have difficulty having a bowel movement, or you are unable to have a bowel movement.  You have pain or inflammation outside the area of the hemorrhoids.  This information is not intended to replace advice given to you by your health care provider. Make sure you discuss any questions you have with your health care provider.   Diverticulosis Diverticulosis is a condition that develops when small pouches (diverticula) form in the wall of the large intestine (colon). The colon is where water is absorbed and stool is formed. The pouches form when the inside layer of the colon pushes through weak spots in the outer layers of the colon. You may have a few pouches or many of them. What are the causes? The cause of this condition is not known. What increases the risk? The following factors may make you more likely to develop this condition:  Being older than age 6. Your risk for this condition increases with age. Diverticulosis is rare among people younger than age 4. By age 17, many people have it.  Eating a low-fiber diet.  Having frequent constipation.  Being  overweight.  Not getting enough exercise.  Smoking.  Taking over-the-counter pain medicines, like aspirin and ibuprofen.  Having a family history of diverticulosis.  What are the signs or symptoms? In most people, there are no symptoms of this condition. If you do have symptoms, they may include:  Bloating.  Cramps in the abdomen.  Constipation or diarrhea.  Pain in the lower left side of the abdomen.  How is this diagnosed? This condition is most often diagnosed during an exam for other colon problems. Because diverticulosis usually has no symptoms, it often cannot be diagnosed independently. This condition may be diagnosed by:  Using a flexible scope to examine the colon (colonoscopy).  Taking an X-ray of the colon after dye has been put into the colon (barium enema).  Doing a CT scan.  How is this treated? You may not need treatment for this condition if you have never developed an infection related to diverticulosis. If you have had an infection before, treatment may include:  Eating a high-fiber diet. This may include eating more fruits, vegetables, and grains.  Taking a fiber supplement.  Taking a live bacteria supplement (probiotic).  Taking medicine to relax your colon.  Taking antibiotic medicines.  Follow these instructions at home:  Drink 6-8 glasses of water or more each day to prevent constipation.  Try not to strain when you have a bowel movement.  If you have had an infection before: ? Eat more fiber as directed by your health care provider or your diet and nutrition specialist (dietitian). ? Take a fiber supplement or probiotic, if your health care provider approves.  Take over-the-counter and prescription medicines only as told by your health care provider.  If you were prescribed an antibiotic, take it as told by your health care provider. Do not stop taking the antibiotic even if you start to feel better.  Keep all follow-up visits as told  by your health care provider. This is important. Contact a health care provider if:  You have pain in your abdomen.  You have bloating.  You have cramps.  You have not had a bowel movement in 3 days. Get help right away if:  Your pain gets worse.  Your bloating becomes very bad.  You have a fever or chills, and your symptoms suddenly get worse.  You vomit.  You have bowel movements that are bloody or black.  You have bleeding from your rectum. Summary  Diverticulosis is a condition that develops when small pouches (diverticula) form in the wall of the large intestine (  colon).  You may have a few pouches or many of them.  This condition is most often diagnosed during an exam for other colon problems.  If you have had an infection related to diverticulosis, treatment may include increasing the fiber in your diet, taking supplements, or taking medicines. This information is not intended to replace advice given to you by your health care provider. Make sure you discuss any questions you have with your health care provider.   Colon Polyps Polyps are tissue growths inside the body. Polyps can grow in many places, including the large intestine (colon). A polyp may be a round bump or a mushroom-shaped growth. You could have one polyp or several. Most colon polyps are noncancerous (benign). However, some colon polyps can become cancerous over time. What are the causes? The exact cause of colon polyps is not known. What increases the risk? This condition is more likely to develop in people who:  Have a family history of colon cancer or colon polyps.  Are older than 2 or older than 45 if they are African American.  Have inflammatory bowel disease, such as ulcerative colitis or Crohn disease.  Are overweight.  Smoke cigarettes.  Do not get enough exercise.  Drink too much alcohol.  Eat a diet that is: ? High in fat and red meat. ? Low in fiber.  Had childhood cancer  that was treated with abdominal radiation.  What are the signs or symptoms? Most polyps do not cause symptoms. If you have symptoms, they may include:  Blood coming from your rectum when having a bowel movement.  Blood in your stool.The stool may look dark red or black.  A change in bowel habits, such as constipation or diarrhea.  How is this diagnosed? This condition is diagnosed with a colonoscopy. This is a procedure that uses a lighted, flexible scope to look at the inside of your colon. How is this treated? Treatment for this condition involves removing any polyps that are found. Those polyps will then be tested for cancer. If cancer is found, your health care provider will talk to you about options for colon cancer treatment. Follow these instructions at home: Diet  Eat plenty of fiber, such as fruits, vegetables, and whole grains.  Eat foods that are high in calcium and vitamin D, such as milk, cheese, yogurt, eggs, liver, fish, and broccoli.  Limit foods high in fat, red meats, and processed meats, such as hot dogs, sausage, bacon, and lunch meats.  Maintain a healthy weight, or lose weight if recommended by your health care provider. General instructions  Do not smoke cigarettes.  Do not drink alcohol excessively.  Keep all follow-up visits as told by your health care provider. This is important. This includes keeping regularly scheduled colonoscopies. Talk to your health care provider about when you need a colonoscopy.  Exercise every day or as told by your health care provider. Contact a health care provider if:  You have new or worsening bleeding during a bowel movement.  You have new or increased blood in your stool.  You have a change in bowel habits.  You unexpectedly lose weight. This information is not intended to replace advice given to you by your health care provider. Make sure you discuss any questions you have with your health care provider.

## 2017-03-04 NOTE — Op Note (Signed)
[facility] Patient Name: Heather Fry Procedure Date: 03/04/2017 9:19 AM MRN: [id number] Date of Birth: 01-Oct-1975 Attending MD: Norvel Richards , MD CSN: [id number] Age: 41 Admit Type: Outpatient Procedure:                Colonoscopy with snare polypectomy Indications:              Hematochezia Providers:                Norvel Richards, MD, Otis Peak B. Gwenlyn Perking RN, RN,                            Randa Spike, Technician Referring MD:              Medicines:                Midazolam 6 mg IV, Meperidine 100 mg IV,                            Ondansetron 4 mg IV, Promethazine 25 mg IV Complications:            No immediate complications. Estimated Blood Loss:     Estimated blood loss was minimal. Procedure:                Pre-Anesthesia Assessment:                           - Prior to the procedure, a History and Physical                            was performed, and patient medications and                            allergies were reviewed. The patient's tolerance of                            previous anesthesia was also reviewed. The risks                            and benefits of the procedure and the sedation                            options and risks were discussed with the patient.                            All questions were answered, and informed consent                            was obtained. Prior Anticoagulants: The patient has                            taken no previous anticoagulant or antiplatelet                            agents. ASA Grade Assessment: II - A patient with  mild systemic disease. After reviewing the risks                            and benefits, the patient was deemed in                            satisfactory condition to undergo the procedure.                           After obtaining informed consent, the colonoscope                            was passed under direct vision. Throughout the            procedure, the patient's blood pressure, pulse, and                            oxygen saturations were monitored continuously. The                            Colonoscope was introduced through the anus and                            advanced to the the cecum, identified by                            appendiceal orifice and ileocecal valve. The                            colonoscopy was performed without difficulty. The                            patient tolerated the procedure well. The quality                            of the bowel preparation was adequate. The                            ileocecal valve, appendiceal orifice, and rectum                            were photographed. Scope In: 9:30:46 AM Scope Out: 9:45:37 AM Scope Withdrawal Time: 0 hours 10 minutes 31 seconds  Total Procedure Duration: 0 hours 14 minutes 51 seconds  Findings:      The perianal and digital rectal examinations were normal.      Scattered small and large-mouthed diverticula were found in the sigmoid       colon.      Internal hemorrhoids were found during retroflexion. The hemorrhoids       were Grade I (internal hemorrhoids that do not prolapse).      A 4 mm polyp was found in the rectum. The polyp was sessile. The polyp       was removed with a cold snare. Resection and retrieval were complete.       Estimated blood loss was minimal.  The exam was otherwise without abnormality on direct and retroflexion       views. Impression:               - Diverticulosis in the sigmoid colon.                           - Internal hemorrhoids.                           - One 4 mm polyp in the rectum, removed with a cold                            snare. Resected and retrieved.                           - The examination was otherwise normal on direct                            and retroflexion views. I suspect bleeding from                            hemorrhoids. Patient is likely a reasonably good                             hemorrhoid banding candidate. Moderate Sedation:      Moderate (conscious) sedation was administered by the endoscopy nurse       and supervised by the endoscopist. The following parameters were       monitored: oxygen saturation, heart rate, blood pressure, respiratory       rate, EKG, adequacy of pulmonary ventilation, and response to care.       Total physician intraservice time was 22 minutes. Recommendation:           - Patient has a contact number available for                            emergencies. The signs and symptoms of potential                            delayed complications were discussed with the                            patient. Return to normal activities tomorrow.                            Written discharge instructions were provided to the                            patient.                           - Resume previous diet.                           - Continue present medications. Begin Benefiber 1  tablespoon twice daily. Anusol cream to the                            anorectum 4 times a day.                           - Repeat colonoscopy date to be determined after                            pending pathology results are reviewed for                            surveillance based on pathology results.                           - Return to GI clinic in 6 weeks. Pamphlet on                            hemorrhoid banding provided. Procedure Code(s):        --- Professional ---                           [phone], Colonoscopy, flexible; with removal of                            tumor(s), polyp(s), or other lesion(s) by snare                            technique                           99152, Moderate sedation services provided by the                            same physician or other qualified health care                            professional performing the diagnostic or                            therapeutic service  that the sedation supports,                            requiring the presence of an independent trained                            observer to assist in the monitoring of the                            patient's level of consciousness and physiological                            status; initial 15 minutes of intraservice time,  patient age 68 years or older Diagnosis Code(s):        --- Professional ---                           K64.0, First degree hemorrhoids                           K62.1, Rectal polyp                           K92.1, Melena (includes Hematochezia)                           K57.30, Diverticulosis of large intestine without                            perforation or abscess without bleeding CPT copyright 2016 [employer]. All rights reserved. The codes documented in this report are preliminary and upon coder review may  be revised to meet current compliance requirements. Cristopher Estimable. Jannell Franta, MD Norvel Richards, MD 03/04/2017 9:56:37 AM This report has been signed electronically. Number of Addenda: 0

## 2017-03-04 NOTE — H&P (Signed)
@LOGO @   Primary Care Physician:  Redmond School, MD Primary Gastroenterologist:  Dr. Gala Romney  Pre-Procedure History & Physical: HPI:  Heather Fry is a 41 y.o. female here for further evaluation of the rectal bleeding seen in the office last month. No family history of any first-degree relatives with colon cancer. No prior colonoscopy.  Past Medical History:  Diagnosis Date  . Anxiety   . Depression   . GERD (gastroesophageal reflux disease)   . Uterine fibroid     Past Surgical History:  Procedure Laterality Date  . ABDOMINAL HYSTERECTOMY     Per patient, uterine fibroids.  . CHOLECYSTECTOMY N/A 11/06/2014   Procedure: LAPAROSCOPIC CHOLECYSTECTOMY;  Surgeon: Jamesetta So, MD;  Location: [facility];  Service: General;  Laterality: N/A;  . DILATION AND CURETTAGE OF UTERUS    . ESOPHAGOGASTRODUODENOSCOPY N/A 10/02/2014   Procedure: ESOPHAGOGASTRODUODENOSCOPY (EGD);  Surgeon: Daneil Dolin, MD;  Location: [facility];  Service: Endoscopy;  Laterality: N/A;  945am - moved to 8:45 - Ginger notified pt  . MOUTH SURGERY     extraction of teeth  . TUBAL LIGATION      Prior to Admission medications   Medication Sig Start Date End Date Taking? Authorizing Provider  cholecalciferol (VITAMIN D) 1000 units tablet Take 1,000 Units by mouth daily.   Yes [provider]  diphenhydrAMINE (BENADRYL) 25 mg capsule Take 50 mg by mouth at bedtime as needed for allergies or sleep.   Yes [provider]  escitalopram (LEXAPRO) 20 MG tablet Take 20 mg by mouth at bedtime.    Yes [provider]  Fiber CAPS Take 2 capsules by mouth daily.   Yes [provider]  hyoscyamine (LEVSIN SL) 0.125 MG SL tablet Place 1 tablet (0.125 mg total) under the tongue every 4 (four) hours as needed. 01/28/17  Yes Mahala Menghini, PA-C  levothyroxine (SYNTHROID, LEVOTHROID) 25 MCG tablet Take 1 tablet (25 mcg total) by mouth daily before breakfast. 02/26/17  Yes Nida, Marella Chimes, MD   metoprolol tartrate (LOPRESSOR) 25 MG tablet Take 1 tablet (25 mg total) by mouth 2 (two) times daily. Patient taking differently: Take 25 mg by mouth every morning.  08/12/16  Yes NidaMarella Chimes, MD  Multiple Vitamin (MULTIVITAMIN WITH MINERALS) TABS Take 1 tablet by mouth daily.   Yes [provider]  Omega-3 Fatty Acids (FISH OIL) 1200 MG CAPS Take 1 capsule by mouth daily.   Yes [provider]  Omeprazole 20 MG TBEC Take 1 tablet (20 mg total) by mouth daily before breakfast. 01/28/17  Yes Mahala Menghini, PA-C  pseudoephedrine-acetaminophen (TYLENOL SINUS) 30-500 MG TABS tablet Take 1 tablet by mouth every 4 (four) hours as needed.   Yes [provider]  acetaminophen (TYLENOL ARTHRITIS PAIN) 650 MG CR tablet Take 2 tablets by mouth 2 (two) times daily as needed. For pain    [provider]    Allergies as of 01/28/2017 - Review Complete 01/28/2017  Allergen Reaction Noted  . Penicillins Other (See Comments) 04/06/2012    Family History  Problem Relation Age of Onset  . Hypertension Father   . Diabetes Father   . Depression Other   . Uterine cancer Mother        ?cervical cancer  . Uterine cancer Sister        suicide  . Colon cancer Paternal Aunt 6    Social History   Social History  . Marital status: Married  Spouse name: N/A  . Number of children: N/A  . Years of education: N/A   Occupational History  . Not on file.   Social History Main Topics  . Smoking status: Former Smoker    Packs/day: 1.00    Years: 3.00    Types: Cigarettes    Quit date: 11/03/1997  . Smokeless tobacco: Never Used  . Alcohol use No  . Drug use: No  . Sexual activity: Yes    Birth control/ protection: Surgical   Other Topics Concern  . Not on file   Social History Narrative  . No narrative on file    Review of Systems: See HPI, otherwise negative ROS  Physical Exam: BP 123/78   Pulse 71   Temp 98.7 F (37.1 C) (Oral)   Resp 16    Ht 5\' 4"  (1.626 m)   Wt 234 lb (106.1 kg)   LMP 10/09/2014   SpO2 99%   BMI 40.17 kg/m  General:   Alert,   pleasant and cooperative in NAD Mouth:  No deformity or lesions. Neck:  Supple; no masses or thyromegaly. No significant cervical adenopathy. Lungs:  Clear throughout to auscultation.   No wheezes, crackles, or rhonchi. No acute distress. Heart:  Regular rate and rhythm; no murmurs, clicks, rubs,  or gallops. Abdomen: Non-distended, normal bowel sounds.  Soft and nontender without appreciable mass or hepatosplenomegaly.  Pulses:  Normal pulses noted. Extremities:  Without clubbing or edema.  Impression:  Pleasant 41 year old lady with paper hematochezia. No prior colonoscopy.   Recommendations:   I've offered to patient diagnostic colonoscopy today.The risks, benefits, limitations, alternatives and imponderables have been reviewed with the patient. Questions have been answered. All parties are agreeable.        Notice: This dictation was prepared with Dragon dictation along with smaller phrase technology. Any transcriptional errors that result from this process are unintentional and may not be corrected upon review.

## 2017-03-06 ENCOUNTER — Encounter: Payer: Self-pay | Admitting: Internal Medicine

## 2017-03-11 ENCOUNTER — Encounter (HOSPITAL_COMMUNITY): Payer: Self-pay | Admitting: Internal Medicine

## 2017-04-16 ENCOUNTER — Telehealth: Payer: Self-pay | Admitting: Gastroenterology

## 2017-04-16 ENCOUNTER — Ambulatory Visit: Payer: Medicaid Other | Admitting: Gastroenterology

## 2017-04-16 ENCOUNTER — Encounter: Payer: Self-pay | Admitting: Gastroenterology

## 2017-04-16 NOTE — Telephone Encounter (Signed)
PATIENT WAS A NO SHOW AND LETTER SENT  °

## 2017-05-04 ENCOUNTER — Telehealth: Payer: Self-pay | Admitting: Internal Medicine

## 2017-05-04 ENCOUNTER — Emergency Department (HOSPITAL_COMMUNITY)
Admission: EM | Admit: 2017-05-04 | Discharge: 2017-05-04 | Disposition: A | Discharge status: Home / Self Care | Payer: Self-pay | Attending: Emergency Medicine | Admitting: Emergency Medicine

## 2017-05-04 ENCOUNTER — Encounter (HOSPITAL_COMMUNITY): Payer: Self-pay | Admitting: Emergency Medicine

## 2017-05-04 ENCOUNTER — Emergency Department (HOSPITAL_COMMUNITY): Payer: Self-pay

## 2017-05-04 DIAGNOSIS — R197 Diarrhea, unspecified: Secondary | ICD-10-CM | POA: Insufficient documentation

## 2017-05-04 DIAGNOSIS — Z87891 Personal history of nicotine dependence: Secondary | ICD-10-CM | POA: Insufficient documentation

## 2017-05-04 DIAGNOSIS — R109 Unspecified abdominal pain: Secondary | ICD-10-CM

## 2017-05-04 DIAGNOSIS — R112 Nausea with vomiting, unspecified: Secondary | ICD-10-CM | POA: Insufficient documentation

## 2017-05-04 DIAGNOSIS — R1084 Generalized abdominal pain: Secondary | ICD-10-CM | POA: Insufficient documentation

## 2017-05-04 DIAGNOSIS — E039 Hypothyroidism, unspecified: Secondary | ICD-10-CM | POA: Insufficient documentation

## 2017-05-04 DIAGNOSIS — N83202 Unspecified ovarian cyst, left side: Secondary | ICD-10-CM | POA: Insufficient documentation

## 2017-05-04 DIAGNOSIS — Z79899 Other long term (current) drug therapy: Secondary | ICD-10-CM | POA: Insufficient documentation

## 2017-05-04 HISTORY — DX: Other hemorrhoids: K64.8

## 2017-05-04 HISTORY — DX: Diverticulosis of intestine, part unspecified, without perforation or abscess without bleeding: K57.90

## 2017-05-04 LAB — CBC WITH DIFFERENTIAL/PLATELET
BASOS PCT: 0 %
Basophils Absolute: 0 10*3/uL (ref 0.0–0.1)
Eosinophils Absolute: 0.1 10*3/uL (ref 0.0–0.7)
Eosinophils Relative: 1 %
HEMATOCRIT: 41.7 % (ref 36.0–46.0)
Hemoglobin: 14.2 g/dL (ref 12.0–15.0)
LYMPHS ABS: 2.7 10*3/uL (ref 0.7–4.0)
Lymphocytes Relative: 21 %
MCH: 29.6 pg (ref 26.0–34.0)
MCHC: 34.1 g/dL (ref 30.0–36.0)
MCV: 86.9 fL (ref 78.0–100.0)
MONO ABS: 0.8 10*3/uL (ref 0.1–1.0)
MONOS PCT: 7 %
NEUTROS ABS: 9 10*3/uL — AB (ref 1.7–7.7)
Neutrophils Relative %: 71 %
Platelets: 304 10*3/uL (ref 150–400)
RBC: 4.8 MIL/uL (ref 3.87–5.11)
RDW: 13.7 % (ref 11.5–15.5)
WBC: 12.7 10*3/uL — ABNORMAL HIGH (ref 4.0–10.5)

## 2017-05-04 LAB — COMPREHENSIVE METABOLIC PANEL
ALT: 30 U/L (ref 14–54)
ANION GAP: 10 (ref 5–15)
AST: 27 U/L (ref 15–41)
Albumin: 4.1 g/dL (ref 3.5–5.0)
Alkaline Phosphatase: 58 U/L (ref 38–126)
BUN: 8 mg/dL (ref 6–20)
CHLORIDE: 108 mmol/L (ref 101–111)
CO2: 23 mmol/L (ref 22–32)
Calcium: 10.2 mg/dL (ref 8.9–10.3)
Creatinine, Ser: 0.62 mg/dL (ref 0.44–1.00)
GFR calc non Af Amer: >60 mL/min (ref >60)
Glucose, Bld: 92 mg/dL (ref 65–99)
Potassium: 4 mmol/L (ref 3.5–5.1)
SODIUM: 141 mmol/L (ref 135–145)
Total Bilirubin: 0.6 mg/dL (ref 0.3–1.2)
Total Protein: 8.2 g/dL — ABNORMAL HIGH (ref 6.5–8.1)

## 2017-05-04 LAB — URINALYSIS, ROUTINE W REFLEX MICROSCOPIC
Bilirubin Urine: NEGATIVE
Glucose, UA: NEGATIVE mg/dL
Hgb urine dipstick: NEGATIVE
Ketones, ur: NEGATIVE mg/dL
LEUKOCYTES UA: NEGATIVE
Nitrite: NEGATIVE
Protein, ur: NEGATIVE mg/dL
SPECIFIC GRAVITY, URINE: 1.01 (ref 1.005–1.030)
pH: 7 (ref 5.0–8.0)

## 2017-05-04 LAB — LIPASE, BLOOD: LIPASE: 45 U/L (ref 11–51)

## 2017-05-04 IMAGING — CT CT ABD-PELV W/ CM
2 of 5 series · 16 of 46 positions shown, 18 images · IV contrast (Isovue)
Comparison: Right upper quadrant ultrasound on [DATE].

CLINICAL DATA: Abdominal pain and cramping with nausea, vomiting
and diarrhea for 3 days.

EXAM:
CT ABDOMEN AND PELVIS WITH CONTRAST
TECHNIQUE: Multidetector CT imaging of the abdomen and pelvis was performed
using the standard protocol following bolus administration of
intravenous contrast.
CONTRAST:  100mL [N9] IOPAMIDOL ([N9]) INJECTION 61%

[Series 2: axial st · axial · 0.77mm/px · z∈[+718,+1123]mm · 13 of 93 slices shown, 15 images]
[im 6/93  soft-tissue]
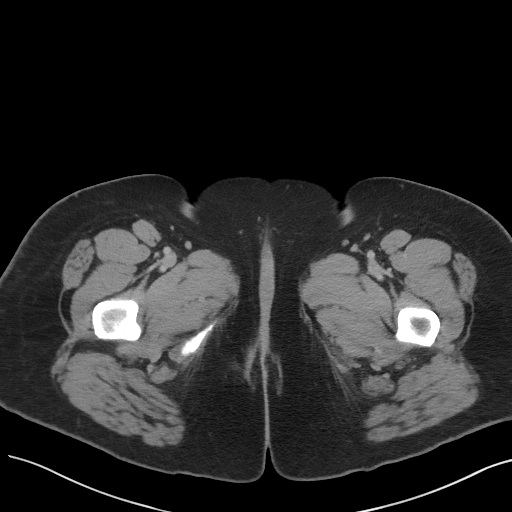
[im 6/93  bone]
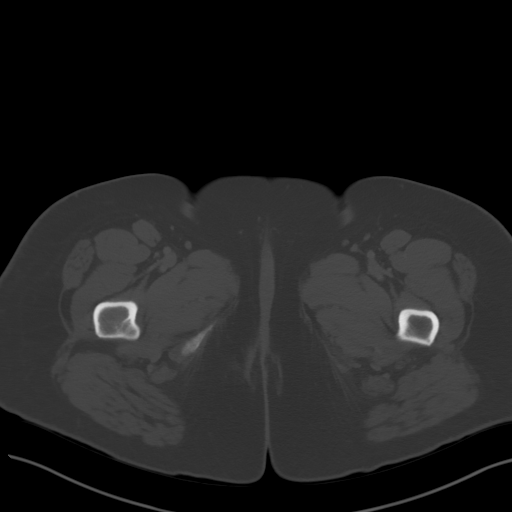
[im 12/93  soft-tissue]
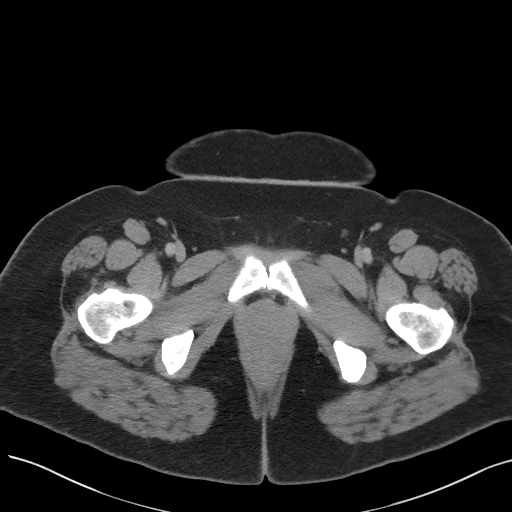
[im 18/93  soft-tissue]
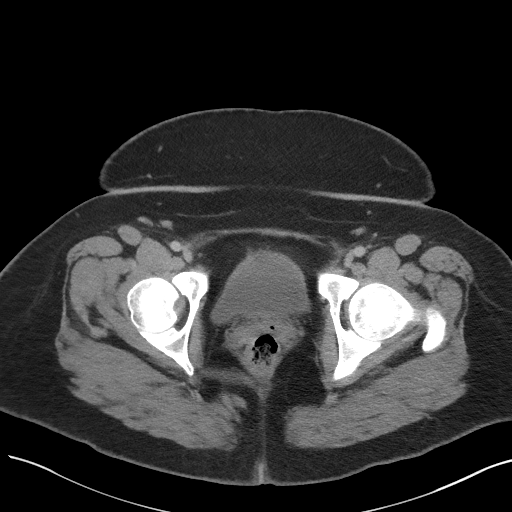
[im 29/93  soft-tissue]
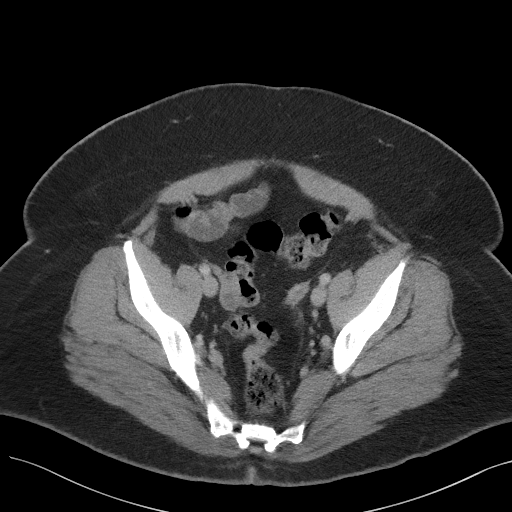
[im 35/93  soft-tissue]
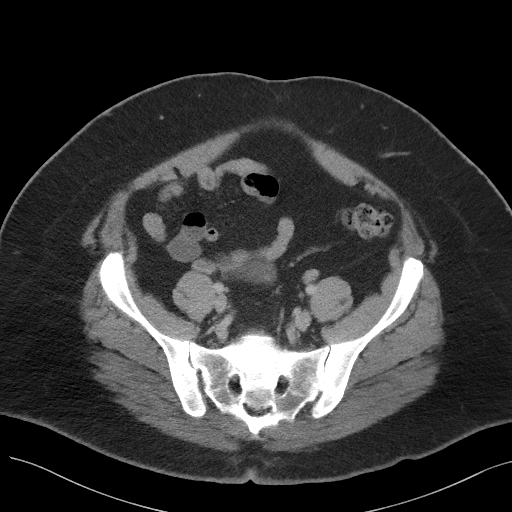
[im 41/93  soft-tissue]
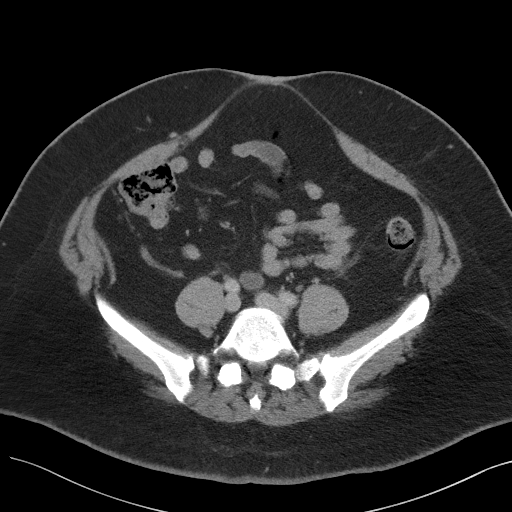
[im 47/93  soft-tissue]
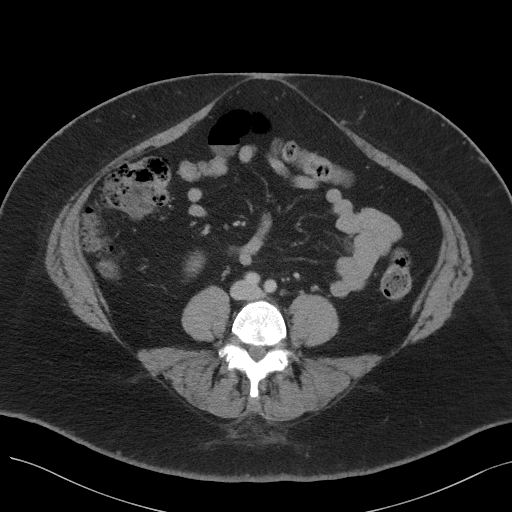
[im 52/93  soft-tissue]
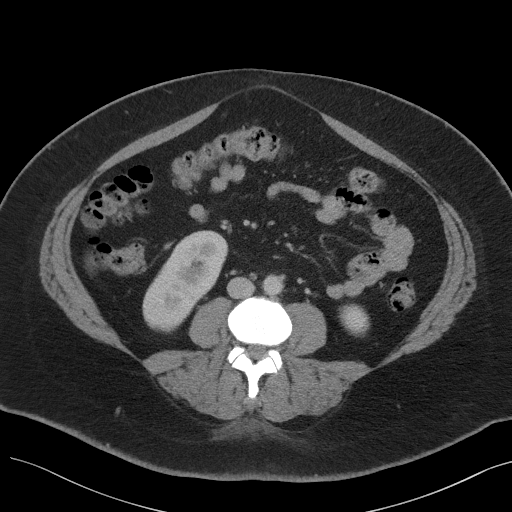
[im 58/93  soft-tissue]
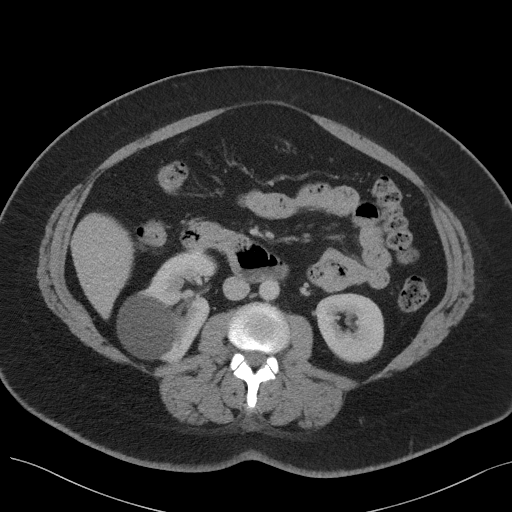
[im 58/93  bone]
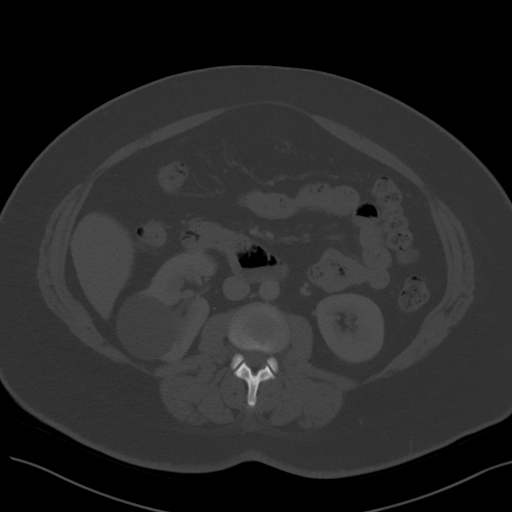
[im 64/93  soft-tissue]
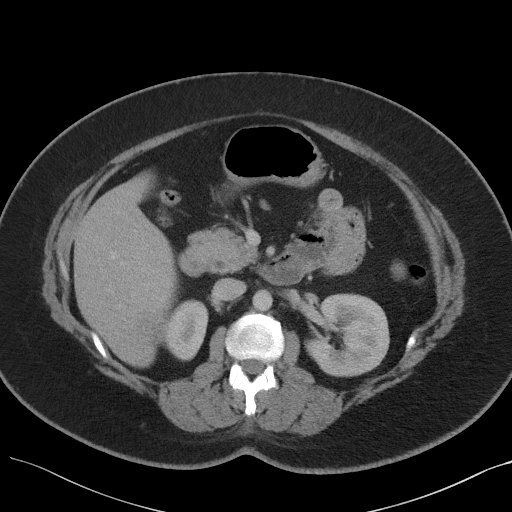
[im 75/93  soft-tissue]
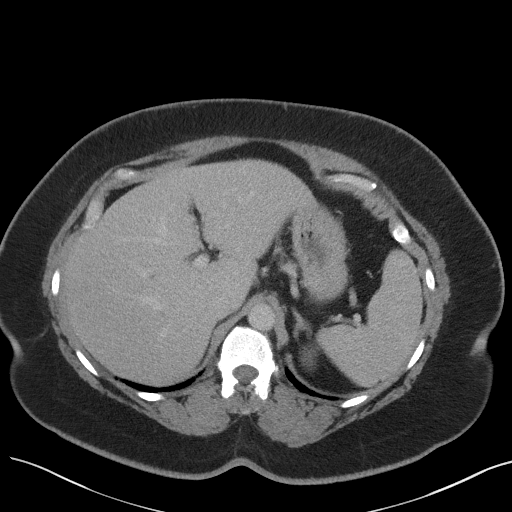
[im 81/93  soft-tissue]
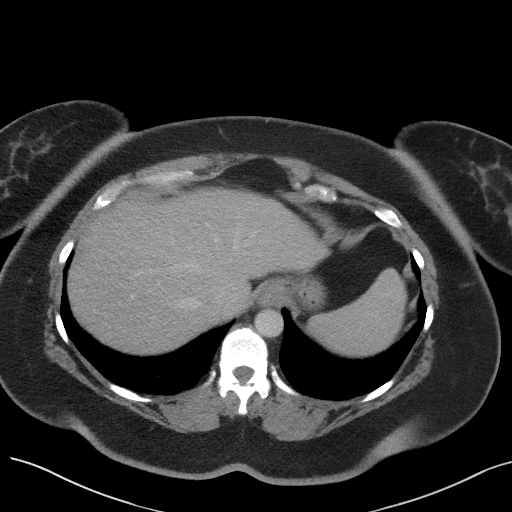
[im 87/93  soft-tissue]
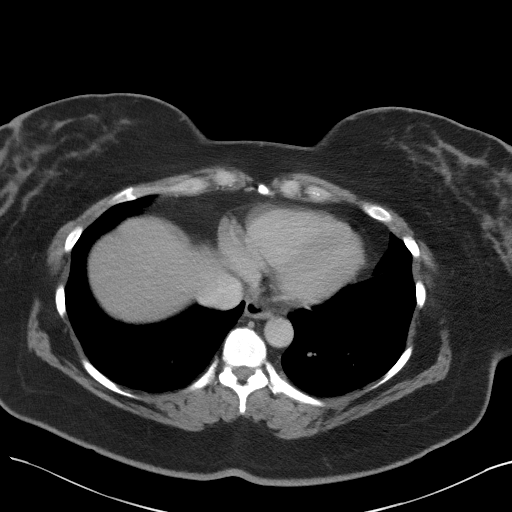

[Series 5: coronal st · coronal · 0.79mm/px · 3 of 108 slices shown]
[im 36/108  soft-tissue]
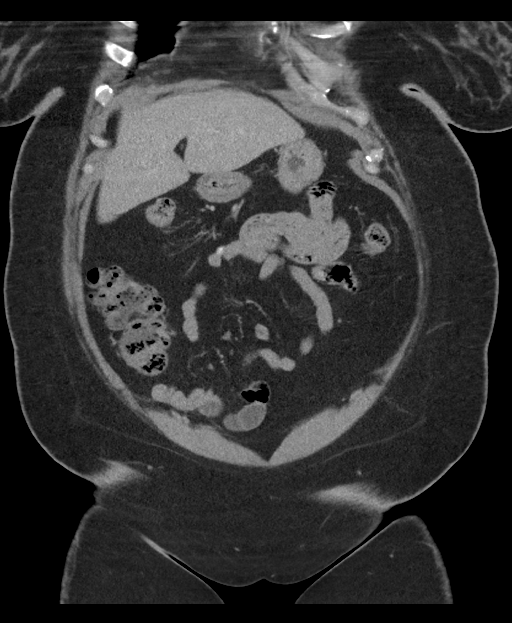
[im 48/108  soft-tissue]
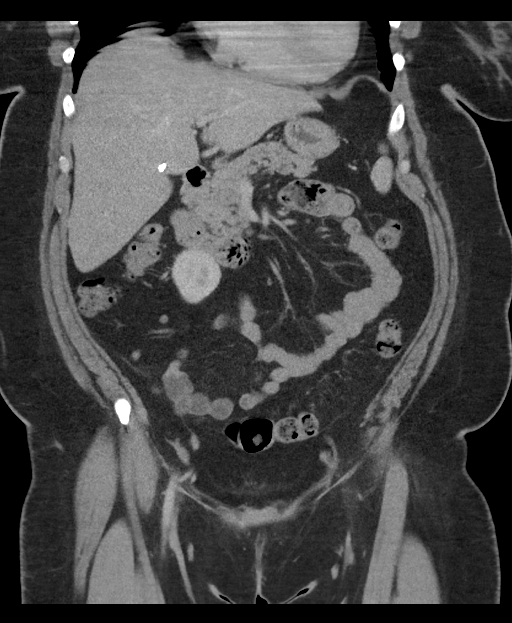
[im 60/108  soft-tissue]
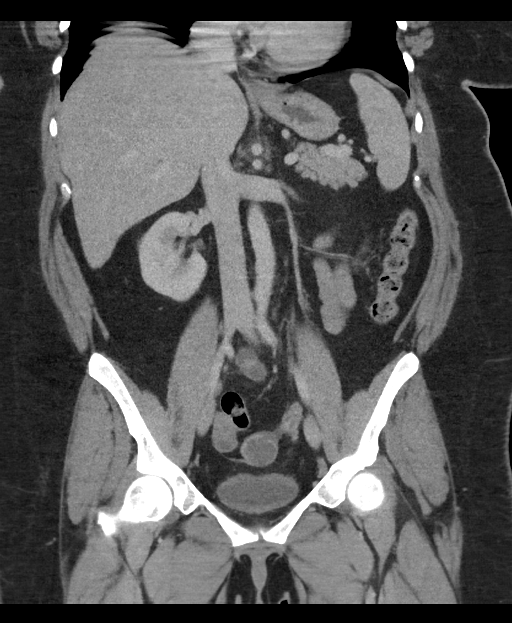

[16 of 46 positions shown; findings below may reference images not displayed]

FINDINGS: Lower chest: No acute abnormality.

Hepatobiliary: The liver shows mild fatty infiltration. The
gallbladder is been removed. No evidence of biliary ductal
dilatation.

Pancreas: Unremarkable. No pancreatic ductal dilatation or
surrounding inflammatory changes.

Spleen: Normal in size without focal abnormality.

Adrenals/Urinary Tract: The adrenal glands appear normal. Simple
cyst of the upper right kidney measures just over 5 cm in diameter.
No evidence of renal calculi, solid renal masses or hydronephrosis.
The bladder is decompressed.

Stomach/Bowel: Bowel shows no evidence of obstruction or
inflammation. No bowel lesions are identified. No evidence of free
air or abscess.

Vascular/Lymphatic: No lymphadenopathy identified in the abdomen or
pelvis. No vascular abnormalities are seen.

Reproductive: The uterus appears to have been removed. Cystic
structure in the midline pelvis measuring 2.8 cm likely is a left
adnexal/ovarian cyst. There does appear to be normal appearing right
ovarian tissue. No free fluid in the pelvis.

Other: No hernias identified.

Musculoskeletal: No acute or significant osseous findings.
IMPRESSION: 1. Mild hepatic steatosis.
2. No acute findings in the abdomen or pelvis.
[DATE] cm adnexal cyst in the pelvis is likely physiologic.

## 2017-05-04 MED ORDER — ONDANSETRON HCL 4 MG PO TABS: 4.0000 mg | ORAL_TABLET | Freq: Three times a day (TID) | ORAL | 0 refills | Status: DC | PRN

## 2017-05-04 MED ORDER — IOPAMIDOL (ISOVUE-300) INJECTION 61%
100.0000 mL | Freq: Once | INTRAVENOUS | Status: AC | PRN
  Administered 2017-05-04: 100 mL via INTRAVENOUS

## 2017-05-04 MED ORDER — MORPHINE SULFATE (PF) 4 MG/ML IV SOLN
4.0000 mg | INTRAVENOUS | Status: DC | PRN
  Administered 2017-05-04: 4 mg via INTRAVENOUS
  Filled 2017-05-04: qty 1

## 2017-05-04 MED ORDER — ONDANSETRON HCL 4 MG/2ML IJ SOLN
4.0000 mg | INTRAMUSCULAR | Status: DC | PRN
  Administered 2017-05-04: 4 mg via INTRAVENOUS
  Filled 2017-05-04: qty 2

## 2017-05-04 MED ORDER — DICYCLOMINE HCL 20 MG PO TABS: 20.0000 mg | ORAL_TABLET | Freq: Four times a day (QID) | ORAL | 0 refills | Status: DC | PRN

## 2017-05-04 MED ORDER — ONDANSETRON 4 MG PO TBDP: 4.0000 mg | ORAL_TABLET | Freq: Three times a day (TID) | ORAL | 0 refills | Status: DC | PRN

## 2017-05-04 NOTE — ED Notes (Signed)
Pt alert & oriented x4, stable gait. Patient given discharge instructions, paperwork & prescription(s). Patient  instructed to stop at the registration desk to finish any additional paperwork. Patient verbalized understanding. Pt left department w/ no further questions. 

## 2017-05-04 NOTE — ED Notes (Signed)
Increased upper abdomina; pain all weekend.

## 2017-05-04 NOTE — Telephone Encounter (Signed)
[phone] PLEASE CALL PATIENT, HAS DIVERTICULITIS AND HAD A FLAIR UP. WANTS SOMETHING FOR NAUSEA CALLED IN UNTIL SHE HAS HER APPT.  PLEASE ADVISE

## 2017-05-04 NOTE — Telephone Encounter (Signed)
New symptom of LLQ pain. Needs OV for evaluation.   I can send in something for nausea but if she is not able to take orally, has decreased urine output, then I agree with need for ED evaluation.   Also, if abdominal pain worsens, go to ED or see PCP urgently.   RX for zofran sent to pharmacy.

## 2017-05-04 NOTE — Discharge Instructions (Signed)
Take the prescriptions as directed.  Increase your fluid intake (ie:  Gatoraide) for the next few days, as discussed.  Eat a bland diet and advance to your regular diet slowly as you can tolerate it.   Avoid full strength juices, as well as milk and milk products until your diarrhea has resolved.   Call your regular medical doctor tomorrow to schedule a follow up appointment in the next 2 to 3 days. Call your GI doctor to schedule a follow up appointment within the next week.  Return to the Emergency Department immediately sooner if worsening.

## 2017-05-04 NOTE — Telephone Encounter (Signed)
I spoke to pt and she said she was diagnosed with diverticulitis when she had her colonoscopy in June. I reviewed procedure note and told her that Dr. Gala Romney said she had diverticulosis. She has not been able to eat since Friday, when she does try her stomach hurts so bad on the LLQ. She had some diarrhea but took 2 Imodium on Sat and hasn't had a BM since.  She said she is so nauseated that she can hardly stand it. I told her she should probably go to ED since she cannot eat and has not had any fluids today except sip of water since 3:00 am this morning.  Forwarding note to Neil Crouch, PA. Who saw pt in the office last.

## 2017-05-04 NOTE — ED Triage Notes (Signed)
Patient complains of abdominal pain and cramping with nausea, vomiting, and diarrhea x 3 days. Patient states history of diverticulosis. She states she get nausea and pain every time she eats.

## 2017-05-04 NOTE — Addendum Note (Signed)
Addended by: Mahala Menghini on: 05/04/2017 02:31 PM   Modules accepted: Orders

## 2017-05-04 NOTE — ED Provider Notes (Signed)
[facility] DEPT Provider Note   CSN: [id number] Arrival date & time: 05/04/17  0093     History   Chief Complaint Chief Complaint  Patient presents with  . Abdominal Pain    HPI Heather Fry is a 41 y.o. female.  HPI  Pt was seen at 1915.  Per pt, c/o gradual onset and persistence of constant generalized abd "pain" for the past 3 days.  Has been associated with multiple intermittent episodes of N/V/D.  Describes the abd pain as "sharp," located more in her LLQ, and "my diverticulosis."   Denies fevers, no back pain, no rash, no CP/SOB, no black or blood in stools or emesis.      Past Medical History:  Diagnosis Date  . Anxiety   . Depression   . Diverticulosis   . GERD (gastroesophageal reflux disease)   . Internal hemorrhoids   . Uterine fibroid     Patient Active Problem List   Diagnosis Date Noted  . GERD (gastroesophageal reflux disease) 01/28/2017  . Rectal bleeding 01/28/2017  . 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 Hypothyroidism 04/14/2016  . Gastric polyp   . RUQ abdominal pain 09/06/2014  . Excessive or frequent menstruation 01/13/2013  . Depression     Past Surgical History:  Procedure Laterality Date  . ABDOMINAL HYSTERECTOMY     Per patient, uterine fibroids.  . CHOLECYSTECTOMY N/A 11/06/2014   Procedure: LAPAROSCOPIC CHOLECYSTECTOMY;  Surgeon: Jamesetta So, MD;  Location: [facility];  Service: General;  Laterality: N/A;  . COLONOSCOPY N/A 03/04/2017   Procedure: COLONOSCOPY;  Surgeon: Daneil Dolin, MD;  Location: [facility];  Service: Endoscopy;  Laterality: N/A;  8:30am  . DILATION AND CURETTAGE OF UTERUS    . ESOPHAGOGASTRODUODENOSCOPY N/A 10/02/2014   Procedure: ESOPHAGOGASTRODUODENOSCOPY (EGD);  Surgeon: Daneil Dolin, MD;  Location: [facility];  Service: Endoscopy;  Laterality: N/A;  945am - moved to 8:45 - Ginger notified pt  . MOUTH SURGERY     extraction of teeth  . POLYPECTOMY  03/04/2017   Procedure: POLYPECTOMY;  Surgeon: Daneil Dolin, MD;  Location: [facility];  Service: Endoscopy;;  colon  . TUBAL LIGATION      OB History    No data available       Home Medications    Prior to Admission medications   Medication Sig Start Date End Date Taking? Authorizing Provider  acetaminophen (TYLENOL ARTHRITIS PAIN) 650 MG CR tablet Take 2 tablets by mouth 2 (two) times daily as needed. For pain    [provider]  cholecalciferol (VITAMIN D) 1000 units tablet Take 1,000 Units by mouth daily.    [provider]  diphenhydrAMINE (BENADRYL) 25 mg capsule Take 50 mg by mouth at bedtime as needed for allergies or sleep.    [provider]  escitalopram (LEXAPRO) 20 MG tablet Take 20 mg by mouth at bedtime.     [provider]  Fiber CAPS Take 2 capsules by mouth daily.    [provider]  hyoscyamine (LEVSIN SL) 0.125 MG SL tablet Place 1 tablet (0.125 mg total) under the tongue every 4 (four) hours as needed. 01/28/17   Mahala Menghini, PA-C  levothyroxine (SYNTHROID, LEVOTHROID) 25 MCG tablet Take 1 tablet (25 mcg total) by mouth daily before breakfast. 02/26/17   Nida, Marella Chimes, MD  metoprolol tartrate (LOPRESSOR) 25 MG tablet Take 1 tablet (25 mg total) by mouth 2 (two) times daily. Patient taking differently: Take 25 mg  by mouth every morning.  08/12/16   Cassandria Anger, MD  Multiple Vitamin (MULTIVITAMIN WITH MINERALS) TABS Take 1 tablet by mouth daily.    [provider]  Omega-3 Fatty Acids (FISH OIL) 1200 MG CAPS Take 1 capsule by mouth daily.    [provider]  Omeprazole 20 MG TBEC Take 1 tablet (20 mg total) by mouth daily before breakfast. 01/28/17   Mahala Menghini, PA-C  ondansetron (ZOFRAN) 4 MG tablet Take 1 tablet (4 mg total) by mouth every 8 (eight) hours as needed for nausea or vomiting. 05/04/17   Mahala Menghini, PA-C  pseudoephedrine-acetaminophen (TYLENOL SINUS) 30-500 MG TABS tablet Take 1 tablet by mouth every 4 (four) hours as  needed.    [provider]    Family History Family History  Problem Relation Age of Onset  . Hypertension Father   . Diabetes Father   . Depression Other   . Uterine cancer Mother        ?cervical cancer  . Uterine cancer Sister        suicide  . Colon cancer Paternal Aunt 59    Social History Social History  Substance Use Topics  . Smoking status: Former Smoker    Packs/day: 1.00    Years: 3.00    Types: Cigarettes    Quit date: 11/03/1997  . Smokeless tobacco: Never Used  . Alcohol use No     Allergies   Levofloxacin and Penicillins   Review of Systems Review of Systems ROS: Statement: All systems negative except as marked or noted in the HPI; Constitutional: Negative for fever and chills. ; ; Eyes: Negative for eye pain, redness and discharge. ; ; ENMT: Negative for ear pain, hoarseness, nasal congestion, sinus pressure and sore throat. ; ; Cardiovascular: Negative for chest pain, palpitations, diaphoresis, dyspnea and peripheral edema. ; ; Respiratory: Negative for cough, wheezing and stridor. ; ; Gastrointestinal: +N/V/D, abd pain. Negative for blood in stool, hematemesis, jaundice and rectal bleeding. . ; ; Genitourinary: Negative for dysuria, flank pain and hematuria. ; ; Musculoskeletal: Negative for back pain and neck pain. Negative for swelling and trauma.; ; Skin: Negative for pruritus, rash, abrasions, blisters, bruising and skin lesion.; ; Neuro: Negative for headache, lightheadedness and neck stiffness. Negative for weakness, altered level of consciousness, altered mental status, extremity weakness, paresthesias, involuntary movement, seizure and syncope.       Physical Exam Updated Vital Signs BP 134/87 (BP Location: Right Arm)   Pulse 72   Temp 98.8 F (37.1 C) (Oral)   Resp 16   Ht 5\' 4"  (1.626 m)   Wt 106.1 kg (234 lb)   LMP 10/09/2014   SpO2 98%   BMI 40.17 kg/m   Physical Exam 1920: Physical examination:  Nursing notes reviewed;  Vital signs and O2 SAT reviewed;  Constitutional: Well developed, Well nourished, Well hydrated, In no acute distress; Head:  Normocephalic, atraumatic; Eyes: EOMI, PERRL, No scleral icterus; ENMT: Mouth and pharynx normal, Mucous membranes moist; Neck: Supple, Full range of motion, No lymphadenopathy; Cardiovascular: Regular rate and rhythm, No gallop; Respiratory: Breath sounds clear & equal bilaterally, No wheezes.  Speaking full sentences with ease, Normal respiratory effort/excursion; Chest: Nontender, Movement normal; Abdomen: Soft, +LUQ and LLQ tenderness to palp. No rebound or guarding. Nondistended, Normal bowel sounds; Genitourinary: No CVA tenderness; Extremities: Pulses normal, No tenderness, No edema, No calf edema or asymmetry.; Neuro: AA&Ox3, Major CN grossly intact.  Speech clear. No gross focal motor or sensory  deficits in extremities.; Skin: Color normal, Warm, Dry.   ED Treatments / Results  Labs (all labs ordered are listed, but only abnormal results are displayed)   EKG  EKG Interpretation None       Radiology   Procedures Procedures (including critical care time)  Medications Ordered in ED Medications  ondansetron (ZOFRAN) injection 4 mg (4 mg Intravenous Given 05/04/17 1933)  morphine 4 MG/ML injection 4 mg (4 mg Intravenous Given 05/04/17 1934)     Initial Impression / Assessment and Plan / ED Course  I have reviewed the triage vital signs and the nursing notes.  Pertinent labs & imaging results that were available during my care of the patient were reviewed by me and considered in my medical decision making (see chart for details).  MDM Reviewed: previous chart, nursing note and vitals Reviewed previous: labs Interpretation: labs and CT scan   Results for orders placed or performed during the hospital encounter of 05/04/17  Lipase, blood  Result Value Ref Range   Lipase 45 11 - 51 U/L  Comprehensive metabolic panel  Result Value Ref Range   Sodium 141  135 - 145 mmol/L   Potassium 4.0 3.5 - 5.1 mmol/L   Chloride 108 101 - 111 mmol/L   CO2 23 22 - 32 mmol/L   Glucose, Bld 92 65 - 99 mg/dL   BUN 8 6 - 20 mg/dL   Creatinine, Ser 0.62 0.44 - 1.00 mg/dL   Calcium 10.2 8.9 - 10.3 mg/dL   Total Protein 8.2 (H) 6.5 - 8.1 g/dL   Albumin 4.1 3.5 - 5.0 g/dL   AST 27 15 - 41 U/L   ALT 30 14 - 54 U/L   Alkaline Phosphatase 58 38 - 126 U/L   Total Bilirubin 0.6 0.3 - 1.2 mg/dL   GFR calc non Af Amer >60 >60 mL/min   GFR calc Af Amer >60 >60 mL/min   Anion gap 10 5 - 15  Urinalysis, Routine w reflex microscopic  Result Value Ref Range   Color, Urine YELLOW YELLOW   APPearance CLEAR CLEAR   Specific Gravity, Urine 1.010 1.005 - 1.030   pH 7.0 5.0 - 8.0   Glucose, UA NEGATIVE NEGATIVE mg/dL   Hgb urine dipstick NEGATIVE NEGATIVE   Bilirubin Urine NEGATIVE NEGATIVE   Ketones, ur NEGATIVE NEGATIVE mg/dL   Protein, ur NEGATIVE NEGATIVE mg/dL   Nitrite NEGATIVE NEGATIVE   Leukocytes, UA NEGATIVE NEGATIVE  CBC with Differential  Result Value Ref Range   WBC 12.7 (H) 4.0 - 10.5 K/uL   RBC 4.80 3.87 - 5.11 MIL/uL   Hemoglobin 14.2 12.0 - 15.0 g/dL   HCT 41.7 36.0 - 46.0 %   MCV 86.9 78.0 - 100.0 fL   MCH 29.6 26.0 - 34.0 pg   MCHC 34.1 30.0 - 36.0 g/dL   RDW 13.7 11.5 - 15.5 %   Platelets 304 150 - 400 K/uL   Neutrophils Relative % 71 %   Neutro Abs 9.0 (H) 1.7 - 7.7 K/uL   Lymphocytes Relative 21 %   Lymphs Abs 2.7 0.7 - 4.0 K/uL   Monocytes Relative 7 %   Monocytes Absolute 0.8 0.1 - 1.0 K/uL   Eosinophils Relative 1 %   Eosinophils Absolute 0.1 0.0 - 0.7 K/uL   Basophils Relative 0 %   Basophils Absolute 0.0 0.0 - 0.1 K/uL   Ct Abdomen Pelvis W Contrast Result Date: 05/04/2017 CLINICAL DATA:  Abdominal pain and cramping with nausea, vomiting and  diarrhea for 3 days. EXAM: CT ABDOMEN AND PELVIS WITH CONTRAST TECHNIQUE: Multidetector CT imaging of the abdomen and pelvis was performed using the standard protocol following bolus  administration of intravenous contrast. CONTRAST:  182mL ISOVUE-300 IOPAMIDOL (ISOVUE-300) INJECTION 61% COMPARISON:  Right upper quadrant ultrasound on 09/11/2014. FINDINGS: Lower chest: No acute abnormality. Hepatobiliary: The liver shows mild fatty infiltration. The gallbladder is been removed. No evidence of biliary ductal dilatation. Pancreas: Unremarkable. No pancreatic ductal dilatation or surrounding inflammatory changes. Spleen: Normal in size without focal abnormality. Adrenals/Urinary Tract: The adrenal glands appear normal. Simple cyst of the upper right kidney measures just over 5 cm in diameter. No evidence of renal calculi, solid renal masses or hydronephrosis. The bladder is decompressed. Stomach/Bowel: Bowel shows no evidence of obstruction or inflammation. No bowel lesions are identified. No evidence of free air or abscess. Vascular/Lymphatic: No lymphadenopathy identified in the abdomen or pelvis. No vascular abnormalities are seen. Reproductive: The uterus appears to have been removed. Cystic structure in the midline pelvis measuring 2.8 cm likely is a left adnexal/ovarian cyst. There does appear to be normal appearing right ovarian tissue. No free fluid in the pelvis. Other: No hernias identified. Musculoskeletal: No acute or significant osseous findings. IMPRESSION: 1. Mild hepatic steatosis. 2. No acute findings in the abdomen or pelvis. 3. 2.8 cm adnexal cyst in the pelvis is likely physiologic. Electronically Signed   By: Aletta Edouard M.D.   On: 05/04/2017 20:49    2100:  Pt has tol PO well while in the ED without N/V.  No stooling while in the ED.  Abd benign, VSS. Feels better and wants to go home now. Workup reassuring. Tx symptomatically at this time. Dx and testing d/w pt and family.  Questions answered.  Verb understanding, agreeable to d/c home with outpt f/u.    Final Clinical Impressions(s) / ED Diagnoses   Final diagnoses:  None    New Prescriptions New  Prescriptions   No medications on file     Francine Graven, DO 05/08/17 [id number]

## 2017-05-04 NOTE — Telephone Encounter (Signed)
LMOM for a return call.  

## 2017-05-05 NOTE — Telephone Encounter (Signed)
Pt was seen in the ED last night.

## 2017-05-18 ENCOUNTER — Telehealth: Payer: Self-pay

## 2017-05-18 ENCOUNTER — Telehealth: Payer: Self-pay | Admitting: Internal Medicine

## 2017-05-18 NOTE — Telephone Encounter (Signed)
Pt is requesting that we refill Lexapro. States she does not have a PCP at this time

## 2017-05-18 NOTE — Telephone Encounter (Signed)
Lexapro prescription needs to be followed by mental health professional, since I will not be able to follow it closely , she needs to seek temporary refill from the same prescriber until she secures her next PMD.

## 2017-05-18 NOTE — Telephone Encounter (Signed)
Routing to the refill box. 

## 2017-05-18 NOTE — Telephone Encounter (Signed)
Pt needs a refill of her generic Lexapro. She uses [facility] in [place]

## 2017-05-19 NOTE — Telephone Encounter (Signed)
Pt is aware. She said her endocrinologist has refilled it for her.

## 2017-05-19 NOTE — Telephone Encounter (Signed)
We generally don't manage depression/anxiety medications. She'll need to contact the provider who prescribes this for her (usually PCP). She can also call her pharmacy and ask them to send a refill request to the prescriber.

## 2017-05-19 NOTE — Telephone Encounter (Signed)
Called pt. No answer °

## 2017-05-20 NOTE — Telephone Encounter (Signed)
Pt.notified

## 2017-06-07 ENCOUNTER — Emergency Department (HOSPITAL_COMMUNITY)
Admission: EM | Admit: 2017-06-07 | Discharge: 2017-06-07 | Disposition: A | Discharge status: Home / Self Care | Payer: Self-pay | Attending: Emergency Medicine | Admitting: Emergency Medicine

## 2017-06-07 ENCOUNTER — Encounter (HOSPITAL_COMMUNITY): Payer: Self-pay | Admitting: Emergency Medicine

## 2017-06-07 ENCOUNTER — Emergency Department (HOSPITAL_COMMUNITY): Payer: Self-pay

## 2017-06-07 DIAGNOSIS — N281 Cyst of kidney, acquired: Secondary | ICD-10-CM | POA: Insufficient documentation

## 2017-06-07 DIAGNOSIS — Z87891 Personal history of nicotine dependence: Secondary | ICD-10-CM | POA: Insufficient documentation

## 2017-06-07 DIAGNOSIS — R109 Unspecified abdominal pain: Secondary | ICD-10-CM

## 2017-06-07 DIAGNOSIS — R1031 Right lower quadrant pain: Secondary | ICD-10-CM | POA: Insufficient documentation

## 2017-06-07 DIAGNOSIS — Z79899 Other long term (current) drug therapy: Secondary | ICD-10-CM | POA: Insufficient documentation

## 2017-06-07 DIAGNOSIS — E039 Hypothyroidism, unspecified: Secondary | ICD-10-CM | POA: Insufficient documentation

## 2017-06-07 LAB — CBC WITH DIFFERENTIAL/PLATELET
Basophils Absolute: 0 10*3/uL (ref 0.0–0.1)
Basophils Relative: 0 %
EOS ABS: 0.2 10*3/uL (ref 0.0–0.7)
Eosinophils Relative: 2 %
HEMATOCRIT: 40.6 % (ref 36.0–46.0)
Hemoglobin: 13.9 g/dL (ref 12.0–15.0)
LYMPHS ABS: 2 10*3/uL (ref 0.7–4.0)
LYMPHS PCT: 18 %
MCH: 30.1 pg (ref 26.0–34.0)
MCHC: 34.2 g/dL (ref 30.0–36.0)
MCV: 87.9 fL (ref 78.0–100.0)
MONOS PCT: 6 %
Monocytes Absolute: 0.7 10*3/uL (ref 0.1–1.0)
NEUTROS ABS: 7.9 10*3/uL — AB (ref 1.7–7.7)
NEUTROS PCT: 74 %
Platelets: 266 10*3/uL (ref 150–400)
RBC: 4.62 MIL/uL (ref 3.87–5.11)
RDW: 13.5 % (ref 11.5–15.5)
WBC: 10.7 10*3/uL — ABNORMAL HIGH (ref 4.0–10.5)

## 2017-06-07 LAB — COMPREHENSIVE METABOLIC PANEL
ALK PHOS: 62 U/L (ref 38–126)
ALT: 22 U/L (ref 14–54)
ANION GAP: 10 (ref 5–15)
AST: 20 U/L (ref 15–41)
Albumin: 4 g/dL (ref 3.5–5.0)
BILIRUBIN TOTAL: 0.4 mg/dL (ref 0.3–1.2)
BUN: 8 mg/dL (ref 6–20)
CALCIUM: 9.7 mg/dL (ref 8.9–10.3)
CO2: 26 mmol/L (ref 22–32)
CREATININE: 0.53 mg/dL (ref 0.44–1.00)
Chloride: 103 mmol/L (ref 101–111)
GFR calc non Af Amer: >60 mL/min (ref >60)
Glucose, Bld: 106 mg/dL — ABNORMAL HIGH (ref 65–99)
Potassium: 4 mmol/L (ref 3.5–5.1)
Sodium: 139 mmol/L (ref 135–145)
TOTAL PROTEIN: 7.8 g/dL (ref 6.5–8.1)

## 2017-06-07 LAB — LIPASE, BLOOD: Lipase: 50 U/L (ref 11–51)

## 2017-06-07 LAB — URINALYSIS, ROUTINE W REFLEX MICROSCOPIC
Bilirubin Urine: NEGATIVE
Glucose, UA: NEGATIVE mg/dL
Hgb urine dipstick: NEGATIVE
KETONES UR: NEGATIVE mg/dL
LEUKOCYTES UA: NEGATIVE
NITRITE: NEGATIVE
PH: 5 (ref 5.0–8.0)
Protein, ur: NEGATIVE mg/dL
Specific Gravity, Urine: 1.005 (ref 1.005–1.030)

## 2017-06-07 IMAGING — CT CT RENAL STONE PROTOCOL
2 of 4 series · 16 of 46 positions shown, 18 images · non-contrast
Comparison: CT abdomen dated [DATE].

CLINICAL DATA: Right flank pain since early this morning.

EXAM:
CT ABDOMEN AND PELVIS WITHOUT CONTRAST
TECHNIQUE: Multidetector CT imaging of the abdomen and pelvis was performed
following the standard protocol without IV contrast.

[Series 2: axial st · axial · 0.92mm/px · z∈[+887,+1317]mm · 13 of 94 slices shown, 15 images]
[im 4/94  soft-tissue]
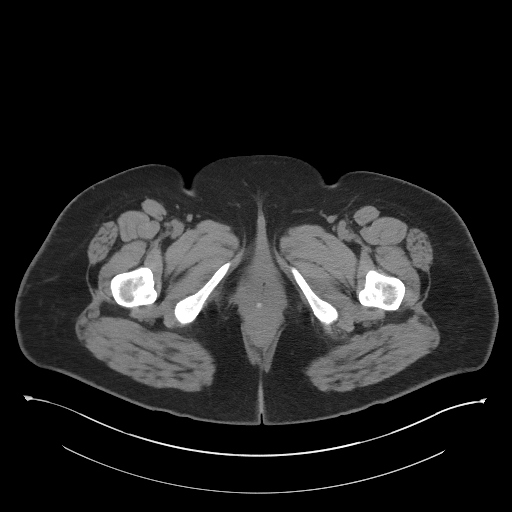
[im 4/94  bone]
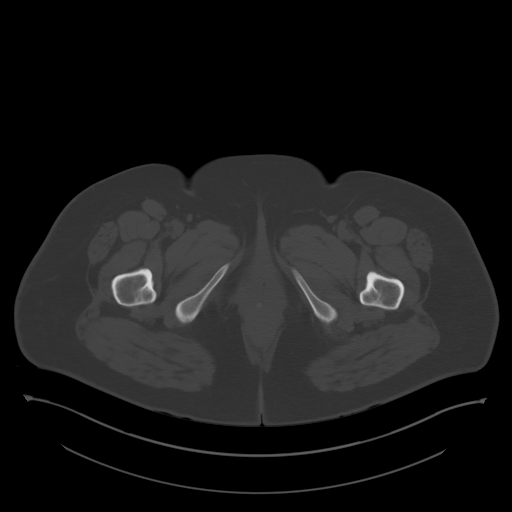
[im 12/94  soft-tissue]
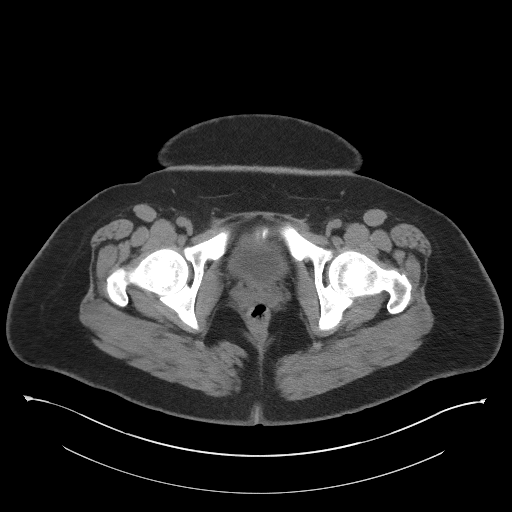
[im 19/94  soft-tissue]
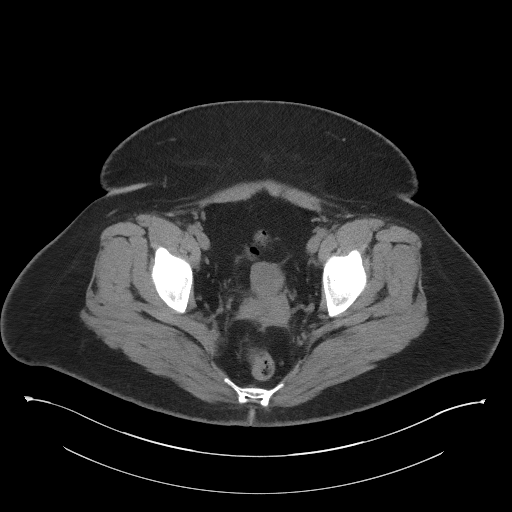
[im 27/94  soft-tissue]
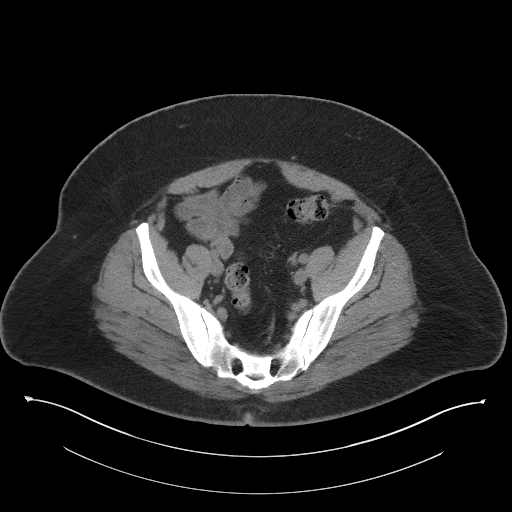
[im 34/94  soft-tissue]
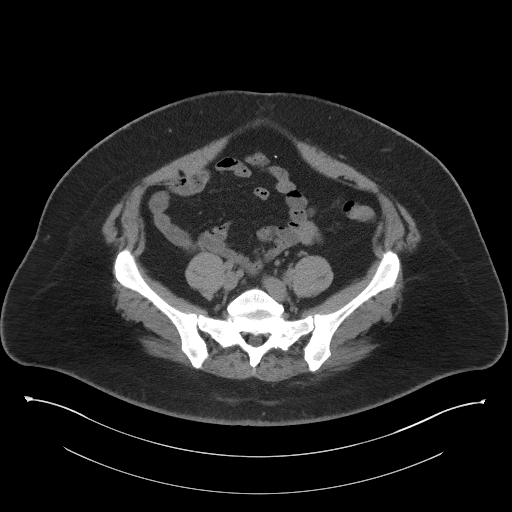
[im 41/94  soft-tissue]
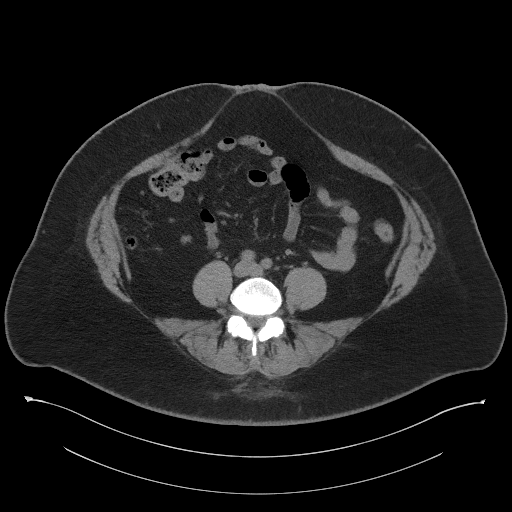
[im 49/94  soft-tissue]
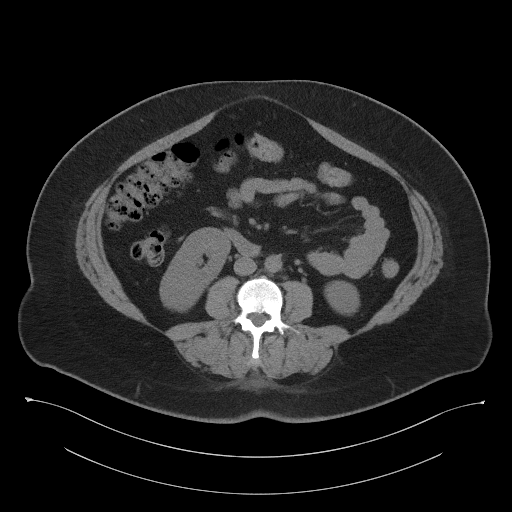
[im 53/94  soft-tissue]
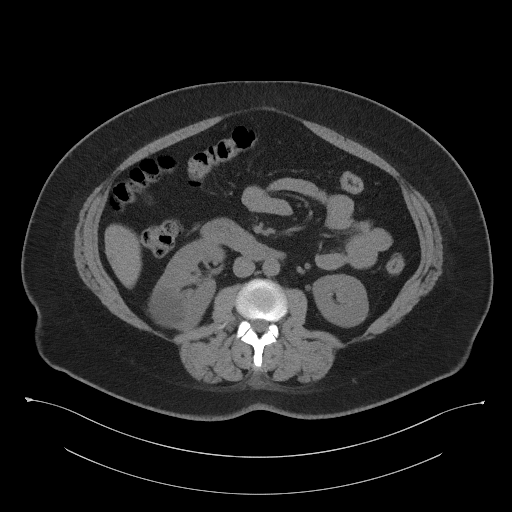
[im 60/94  soft-tissue]
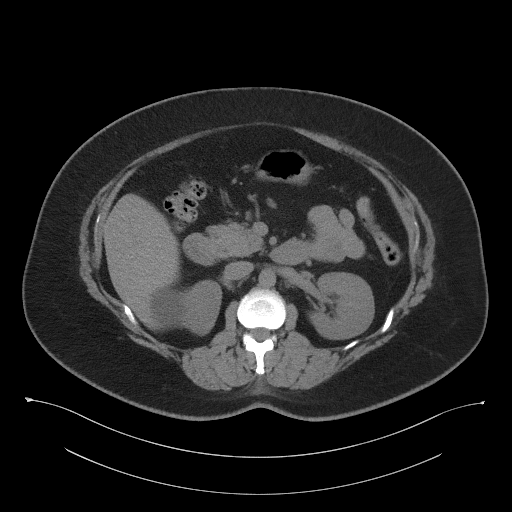
[im 60/94  bone]
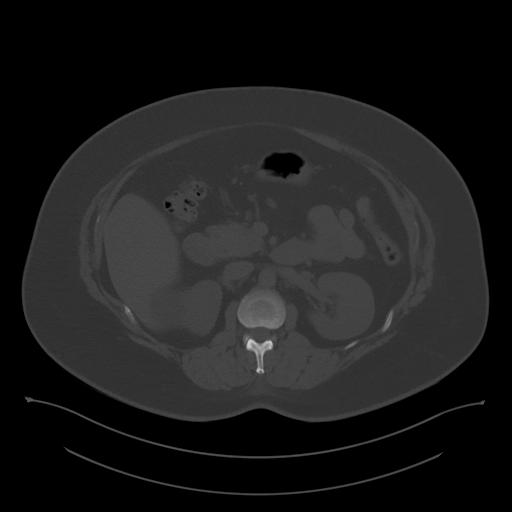
[im 67/94  soft-tissue]
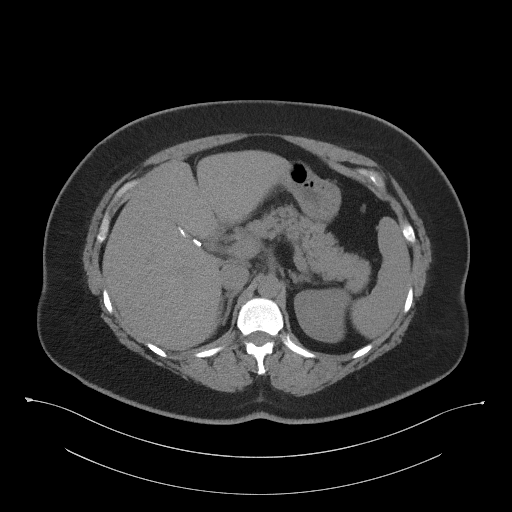
[im 75/94  soft-tissue]
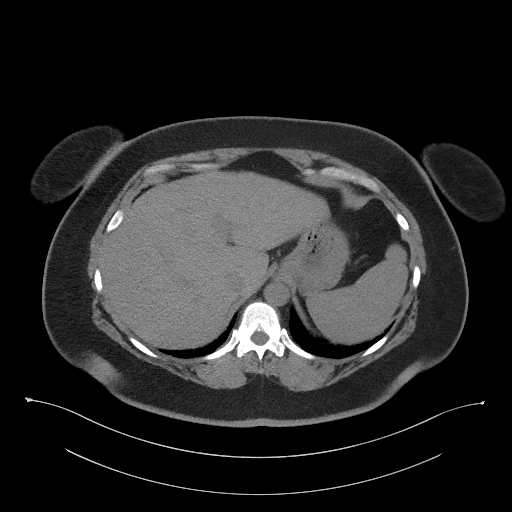
[im 82/94  soft-tissue]
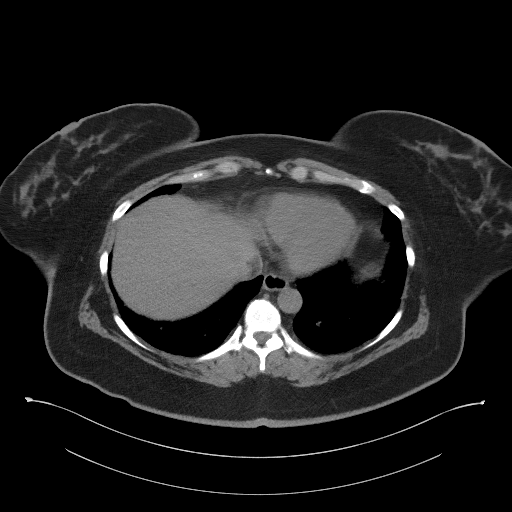
[im 90/94  soft-tissue]
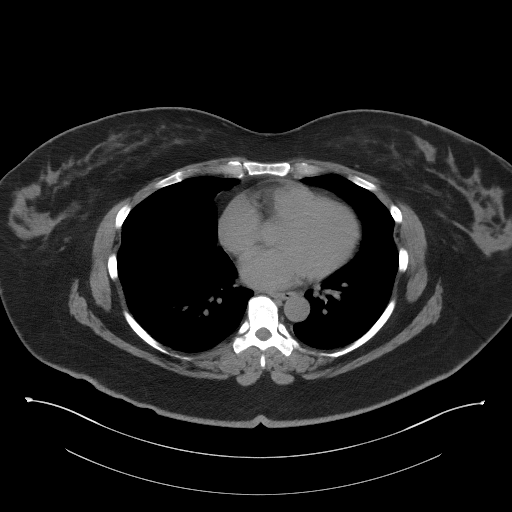

[Series 5: coronal st · coronal · 0.79mm/px · 3 of 107 slices shown]
[im 36/107  soft-tissue]
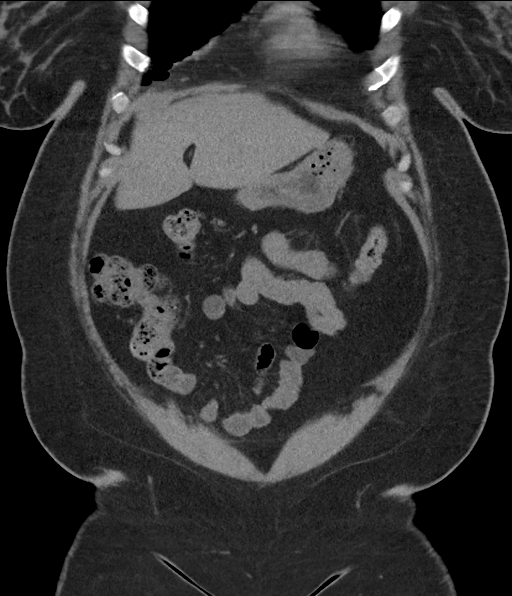
[im 48/107  soft-tissue]
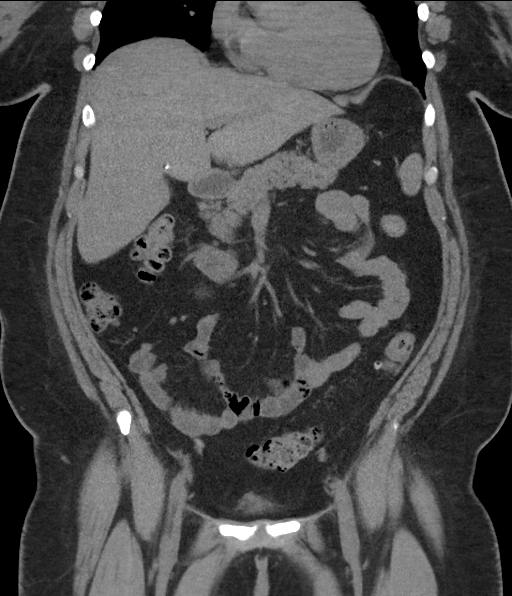
[im 59/107  soft-tissue]
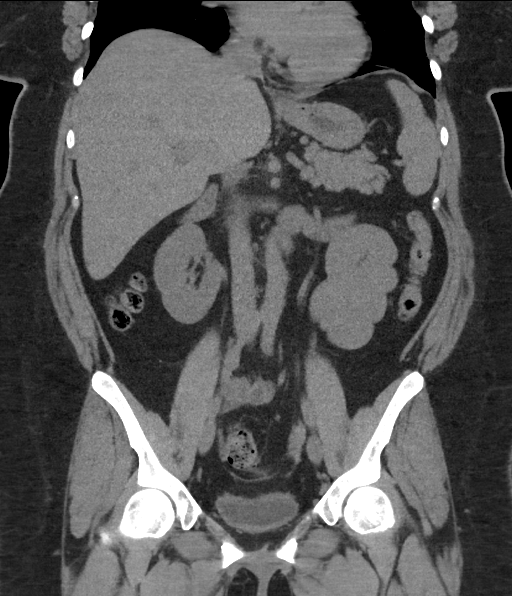

[16 of 46 positions shown; findings below may reference images not displayed]

FINDINGS: Lower chest: No acute abnormality.

Hepatobiliary: No focal liver abnormality is seen. Status post
cholecystectomy. No biliary dilatation.

Pancreas: Unremarkable. No pancreatic ductal dilatation or
surrounding inflammatory changes.

Spleen: Normal in size without focal abnormality.

Adrenals/Urinary Tract: Stable right renal cyst measuring
approximately 5 cm greatest dimension. No renal stone or
hydronephrosis bilaterally. No perinephric fluid. No ureteral or
bladder calculi identified. Bladder appears normal, decompressed.

Stomach/Bowel: Bowel is normal in caliber. No bowel wall thickening
or evidence of bowel wall inflammation seen. Appendix is normal.
Stomach is unremarkable.

Vascular/Lymphatic: No significant vascular findings are present. No
enlarged abdominal or pelvic lymph nodes.

Reproductive: Uterus and bilateral adnexa are unremarkable.

Other: No free fluid or abscess collection. No free intraperitoneal
air.

Musculoskeletal: No acute or significant osseous abnormality.
Superficial soft tissues are unremarkable. Chronic diastasis of the
midline rectus abdominus musculature.
IMPRESSION: 1. No acute findings within the abdomen or pelvis. No source for
acute right flank pain. No hydronephrosis or perinephric fluid. No
ureteral or bladder calculi. No bowel obstruction or evidence of
bowel wall inflammation. No evidence of acute solid organ
abnormality. Appendix is normal.
2. Chronic/incidental findings detailed above.

## 2017-06-07 MED ORDER — KETOROLAC TROMETHAMINE 30 MG/ML IJ SOLN
30.0000 mg | Freq: Once | INTRAMUSCULAR | Status: AC
  Administered 2017-06-07: 30 mg via INTRAVENOUS

## 2017-06-07 MED ORDER — KETOROLAC TROMETHAMINE 30 MG/ML IJ SOLN
30.0000 mg | Freq: Once | INTRAMUSCULAR | Status: DC
  Filled 2017-06-07: qty 1

## 2017-06-07 NOTE — ED Provider Notes (Addendum)
[facility] DEPT Provider Note   CSN: [id number] Arrival date & time: 06/07/17  0904     History   Chief Complaint Chief Complaint  Patient presents with  . Flank Pain    HPI COURTANY MCMURPHY is a 41 y.o. female.  HPI   41 year old female presents today complaining right-sided flank pain that began at 2 AM. She states she was in her usual state of health prior to thTransient and some pain in the right lower quadrant and radiated up into the back. She describes it as sharp and crampy, it is unchanged by by mouth intake, although there is some increase since movements. She denies nausea, vomiting, diarrhea, fever, chills, but has had some increased urination. She has had hysterectomy and cholecystectomy. She has not eaten since the pain began the past because she knew that she was coming to the ED. She has not had any similar symptoms in the past. She denies any history of kidney stones, DVT, PE, cough, or pelvic infections. Past Medical History:  Diagnosis Date  . Anxiety   . Depression   . Diverticulosis   . GERD (gastroesophageal reflux disease)   . Internal hemorrhoids   . Uterine fibroid     Patient Active Problem List   Diagnosis Date Noted  . GERD (gastroesophageal reflux disease) 01/28/2017  . Rectal bleeding 01/28/2017  . 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 Hypothyroidism 04/14/2016  . Gastric polyp   . RUQ abdominal pain 09/06/2014  . Excessive or frequent menstruation 01/13/2013  . Depression     Past Surgical History:  Procedure Laterality Date  . ABDOMINAL HYSTERECTOMY     Per patient, uterine fibroids.  . ABDOMINAL HYSTERECTOMY    . CHOLECYSTECTOMY N/A 11/06/2014   Procedure: LAPAROSCOPIC CHOLECYSTECTOMY;  Surgeon: Jamesetta So, MD;  Location: [facility];  Service: General;  Laterality: N/A;  . COLONOSCOPY N/A 03/04/2017   Procedure: COLONOSCOPY;  Surgeon: Daneil Dolin, MD;  Location: [facility];  Service: Endoscopy;  Laterality: N/A;  8:30am  . DILATION  AND CURETTAGE OF UTERUS    . ESOPHAGOGASTRODUODENOSCOPY N/A 10/02/2014   Procedure: ESOPHAGOGASTRODUODENOSCOPY (EGD);  Surgeon: Daneil Dolin, MD;  Location: [facility];  Service: Endoscopy;  Laterality: N/A;  945am - moved to 8:45 - Ginger notified pt  . MOUTH SURGERY     extraction of teeth  . POLYPECTOMY  03/04/2017   Procedure: POLYPECTOMY;  Surgeon: Daneil Dolin, MD;  Location: [facility];  Service: Endoscopy;;  colon  . TUBAL LIGATION      OB History    Gravida Para Term Preterm AB Living   2 2 2     2    SAB TAB Ectopic Multiple Live Births                   Home Medications    Prior to Admission medications   Medication Sig Start Date End Date Taking? Authorizing Provider  cholecalciferol (VITAMIN D) 1000 units tablet Take 1,000 Units by mouth daily.    [provider]  dicyclomine (BENTYL) 20 MG tablet Take 1 tablet (20 mg total) by mouth every 6 (six) hours as needed for spasms (abdominal cramping). 05/04/17   Francine Graven, DO  diphenhydrAMINE (BENADRYL) 25 mg capsule Take 50 mg by mouth at bedtime as needed for allergies or sleep.    [provider]  escitalopram (LEXAPRO) 20 MG tablet Take 20 mg by mouth at bedtime.     [provider]  hyoscyamine (  LEVSIN SL) 0.125 MG SL tablet Place 1 tablet (0.125 mg total) under the tongue every 4 (four) hours as needed. 01/28/17   Mahala Menghini, PA-C  levothyroxine (SYNTHROID, LEVOTHROID) 25 MCG tablet Take 1 tablet (25 mcg total) by mouth daily before breakfast. 02/26/17   Nida, Marella Chimes, MD  metoprolol tartrate (LOPRESSOR) 25 MG tablet Take 1 tablet (25 mg total) by mouth 2 (two) times daily. Patient taking differently: Take 25 mg by mouth every morning.  08/12/16   Cassandria Anger, MD  Multiple Vitamin (MULTIVITAMIN WITH MINERALS) TABS Take 1 tablet by mouth daily.    [provider]  Omega-3 Fatty Acids (FISH OIL) 1200 MG CAPS Take 1 capsule by mouth daily.    [provider]  Omeprazole 20 MG TBEC Take 1 tablet (20 mg total) by mouth daily before breakfast. 01/28/17   Mahala Menghini, PA-C  ondansetron (ZOFRAN ODT) 4 MG disintegrating tablet Take 1 tablet (4 mg total) by mouth every 8 (eight) hours as needed for nausea or vomiting. 05/04/17   Francine Graven, DO  ondansetron (ZOFRAN) 4 MG tablet Take 1 tablet (4 mg total) by mouth every 8 (eight) hours as needed for nausea or vomiting. 05/04/17   Mahala Menghini, PA-C  pseudoephedrine-acetaminophen (TYLENOL SINUS) 30-500 MG TABS tablet Take 1 tablet by mouth every 4 (four) hours as needed.    [provider]    Family History Family History  Problem Relation Age of Onset  . Hypertension Father   . Diabetes Father   . Depression Other   . Uterine cancer Mother        ?cervical cancer  . Uterine cancer Sister        suicide  . Colon cancer Paternal Aunt 2    Social History Social History  Substance Use Topics  . Smoking status: Former Smoker    Packs/day: 1.00    Years: 3.00    Types: Cigarettes    Quit date: 11/03/1997  . Smokeless tobacco: Never Used  . Alcohol use No     Allergies   Levofloxacin and Penicillins   Review of Systems Review of Systems  All other systems reviewed and are negative.    Physical Exam Updated Vital Signs BP 135/86 (BP Location: Right Arm)   Pulse 85   Temp 97.9 F (36.6 C) (Oral)   Resp 18   Ht 1.626 m (5\' 4" )   Wt 106.1 kg (234 lb)   LMP 10/09/2014   SpO2 100%   BMI 40.17 kg/m   Physical Exam  Constitutional: She is oriented to person, place, and time. She appears well-developed and well-nourished. No distress.  HENT:  Head: Normocephalic and atraumatic.  Right Ear: External ear normal.  Left Ear: External ear normal.  Nose: Nose normal.  Eyes: Pupils are equal, round, and reactive to light. Conjunctivae and EOM are normal.  Neck: Normal range of motion. Neck supple.  Cardiovascular: Normal rate, regular rhythm, normal heart  sounds and intact distal pulses.   Pulmonary/Chest: Effort normal and breath sounds normal.  Abdominal: Soft. Bowel sounds are normal. She exhibits no distension and no mass. There is no tenderness. There is no rebound and no guarding.  Genitourinary:  Genitourinary Comments: Mild TTP right CVA  Musculoskeletal: Normal range of motion.  Neurological: She is alert and oriented to person, place, and time. She exhibits normal muscle tone. Coordination normal.  Skin: Skin is warm and dry. Capillary refill takes less than 2 seconds.  Psychiatric: She has a normal mood and affect. Her behavior is normal. Thought content normal.  Nursing note and vitals reviewed.    ED Treatments / Results  Labs (all labs ordered are listed, but only abnormal results are displayed) Labs Reviewed  URINALYSIS, ROUTINE W REFLEX MICROSCOPIC - Abnormal; Notable for the following:       Result Value   Color, Urine STRAW (*)    All other components within normal limits  CBC WITH DIFFERENTIAL/PLATELET - Abnormal; Notable for the following:    WBC 10.7 (*)    Neutro Abs 7.9 (*)    All other components within normal limits  COMPREHENSIVE METABOLIC PANEL - Abnormal; Notable for the following:    Glucose, Bld 106 (*)    All other components within normal limits  LIPASE, BLOOD    EKG  EKG Interpretation None       Radiology Ct Renal Stone Study  Result Date: 06/07/2017 CLINICAL DATA:  Right flank pain since early this morning. EXAM: CT ABDOMEN AND PELVIS WITHOUT CONTRAST TECHNIQUE: Multidetector CT imaging of the abdomen and pelvis was performed following the standard protocol without IV contrast. COMPARISON:  CT abdomen dated 05/04/2017. FINDINGS: Lower chest: No acute abnormality. Hepatobiliary: No focal liver abnormality is seen. Status post cholecystectomy. No biliary dilatation. Pancreas: Unremarkable. No pancreatic ductal dilatation or surrounding inflammatory changes. Spleen: Normal in size without focal  abnormality. Adrenals/Urinary Tract: Stable right renal cyst measuring approximately 5 cm greatest dimension. No renal stone or hydronephrosis bilaterally. No perinephric fluid. No ureteral or bladder calculi identified. Bladder appears normal, decompressed. Stomach/Bowel: Bowel is normal in caliber. No bowel wall thickening or evidence of bowel wall inflammation seen. Appendix is normal. Stomach is unremarkable. Vascular/Lymphatic: No significant vascular findings are present. No enlarged abdominal or pelvic lymph nodes. Reproductive: Uterus and bilateral adnexa are unremarkable. Other: No free fluid or abscess collection. No free intraperitoneal air. Musculoskeletal: No acute or significant osseous abnormality. Superficial soft tissues are unremarkable. 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 IMPRESSION: 1. No acute findings within the abdomen or pelvis. No source for acute right flank pain. No hydronephrosis or perinephric fluid. No ureteral or bladder calculi. No bowel obstruction or evidence of bowel wall inflammation. No evidence of acute solid organ abnormality. Appendix is normal. 2. Chronic/incidental findings detailed above. Electronically Signed   By: Franki Cabot M.D.   On: 06/07/2017 10:38    Procedures Procedures (including critical care time)  Medications Ordered in ED Medications  ketorolac (TORADOL) 30 MG/ML injection 30 mg (not administered)     Initial Impression / Assessment and Plan / ED Course  I have reviewed the triage vital signs and the nursing notes.  Pertinent labs & imaging results that were available during my care of the patient were reviewed by me and considered in my medical decision making (see chart for details).      -41year-old female with onset of right flank pain last evening. Here on her physical exam there is no significant tenderness to the abdomen flank or back Laboratory evaluation and CT revealed no evidence of acute findings.  Patient had IV placed received Toradol with some decrease in discomfort. The patient is stable for discharge home at this time. We have discussed return precautions including increased pain, fever, chills or inability to tolerate fluids. Also discussed need for close follow-up and she voices understanding. Incidental right renal cyst noted on CT and discussed with patient Final Clinical Impressions(s) / ED Diagnoses   Final diagnoses:  Right flank pain  Renal cyst    New Prescriptions New Prescriptions   No medications on file     Pattricia Boss, MD 06/07/17 [place]    Pattricia Boss, MD 06/07/17 1054

## 2017-06-07 NOTE — Discharge Instructions (Signed)
Please take only clear fluids for 6-12 hours.  Return if pain worsens, unable to tolerate fluids, fever, or worse at any time.  You have a cyst on the right kidney which is the same as seen on previous studies and is unlikely to be cause of any of your symptoms.  You should discuss this with your primary doctor to determine if any follow up is needed.

## 2017-06-07 NOTE — ED Notes (Signed)
Went to check on pt. Patient @radiology 

## 2017-06-07 NOTE — ED Triage Notes (Signed)
Patient c/o right flank pain that radiates into back and ribs. Per patient woke with pain this morning at 2am. Denies any recent injuries or straining. Denies any urinary symptoms or hx of kidney stones. Pain worse with movement and deep breathe. Patient states small amount of diarrhea this morning but no nausea, vomiting, shortness of breath, or fevers.  Patient has hx of IBS and diverticulosis.

## 2017-06-07 NOTE — ED Notes (Signed)
Patient transported to CT 

## 2017-06-11 ENCOUNTER — Ambulatory Visit: Payer: Self-pay | Admitting: Gastroenterology

## 2017-06-22 DIAGNOSIS — H40033 Anatomical narrow angle, bilateral: Secondary | ICD-10-CM | POA: Diagnosis not present

## 2017-06-22 DIAGNOSIS — G44219 Episodic tension-type headache, not intractable: Secondary | ICD-10-CM | POA: Diagnosis not present

## 2017-06-25 ENCOUNTER — Encounter (HOSPITAL_COMMUNITY): Payer: Self-pay | Admitting: *Deleted

## 2017-06-25 ENCOUNTER — Emergency Department (HOSPITAL_COMMUNITY)
Admission: EM | Admit: 2017-06-25 | Discharge: 2017-06-25 | Disposition: A | Discharge status: Home / Self Care | Payer: Self-pay | Attending: Emergency Medicine | Admitting: Emergency Medicine

## 2017-06-25 DIAGNOSIS — E039 Hypothyroidism, unspecified: Secondary | ICD-10-CM | POA: Insufficient documentation

## 2017-06-25 DIAGNOSIS — Z87891 Personal history of nicotine dependence: Secondary | ICD-10-CM | POA: Insufficient documentation

## 2017-06-25 DIAGNOSIS — Z79899 Other long term (current) drug therapy: Secondary | ICD-10-CM | POA: Insufficient documentation

## 2017-06-25 DIAGNOSIS — J0110 Acute frontal sinusitis, unspecified: Secondary | ICD-10-CM | POA: Insufficient documentation

## 2017-06-25 MED ORDER — DOXYCYCLINE HYCLATE 100 MG PO CAPS: 100.0000 mg | ORAL_CAPSULE | Freq: Two times a day (BID) | ORAL | 0 refills | Status: DC

## 2017-06-25 NOTE — ED Notes (Signed)
Pt states nasal congestion, had a headache. Pt c/o productive cough & low grade fever. Pt denies any N/V/ Pt says treating herself for a yeast infection.

## 2017-06-25 NOTE — ED Triage Notes (Addendum)
Pt reports cough, congestion, chills, bilateral ear pain and states she just feels "weak" Pt states I feel like I did when I was dehydrated.

## 2017-06-25 NOTE — ED Notes (Signed)
Pt alert & oriented x4, stable gait. Patient given discharge instructions, paperwork & prescription(s). Patient  instructed to stop at the registration desk to finish any additional paperwork. Patient verbalized understanding. Pt left department w/ no further questions. 

## 2017-06-26 ENCOUNTER — Ambulatory Visit: Payer: Self-pay | Admitting: Gastroenterology

## 2017-06-26 NOTE — ED Provider Notes (Signed)
[facility] DEPT Provider Note   CSN: [id number] Arrival date & time: 06/25/17  2040     History   Chief Complaint Chief Complaint  Patient presents with  . Nasal Congestion    HPI Heather Fry is a 41 y.o. female.  The history is provided by the patient.  URI   This is a new problem. The current episode started more than 1 week ago. The problem has been gradually worsening. The maximum temperature recorded prior to her arrival was 100 to 100.9 F. Associated symptoms include congestion, ear pain, headaches, rhinorrhea and cough. Pertinent negatives include no chest pain, no sore throat and no neck pain. Associated symptoms comments: Frontal sinus pain . She has tried an inhaler and steam (decongestants including Tylenol sinus preparation) for the symptoms. The treatment provided no relief.    Past Medical History:  Diagnosis Date  . Anxiety   . Depression   . Diverticulosis   . GERD (gastroesophageal reflux disease)   . Internal hemorrhoids   . Uterine fibroid     Patient Active Problem List   Diagnosis Date Noted  . GERD (gastroesophageal reflux disease) 01/28/2017  . Rectal bleeding 01/28/2017  . 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 Hypothyroidism 04/14/2016  . Gastric polyp   . RUQ abdominal pain 09/06/2014  . Excessive or frequent menstruation 01/13/2013  . Depression     Past Surgical History:  Procedure Laterality Date  . ABDOMINAL HYSTERECTOMY     Per patient, uterine fibroids.  . ABDOMINAL HYSTERECTOMY    . CHOLECYSTECTOMY N/A 11/06/2014   Procedure: LAPAROSCOPIC CHOLECYSTECTOMY;  Surgeon: Jamesetta So, MD;  Location: [facility];  Service: General;  Laterality: N/A;  . COLONOSCOPY N/A 03/04/2017   Procedure: COLONOSCOPY;  Surgeon: Daneil Dolin, MD;  Location: [facility];  Service: Endoscopy;  Laterality: N/A;  8:30am  . DILATION AND CURETTAGE OF UTERUS    . ESOPHAGOGASTRODUODENOSCOPY N/A 10/02/2014   Procedure: ESOPHAGOGASTRODUODENOSCOPY (EGD);   Surgeon: Daneil Dolin, MD;  Location: [facility];  Service: Endoscopy;  Laterality: N/A;  945am - moved to 8:45 - Ginger notified pt  . MOUTH SURGERY     extraction of teeth  . POLYPECTOMY  03/04/2017   Procedure: POLYPECTOMY;  Surgeon: Daneil Dolin, MD;  Location: [facility];  Service: Endoscopy;;  colon  . TUBAL LIGATION      OB History    Gravida Para Term Preterm AB Living   2 2 2     2    SAB TAB Ectopic Multiple Live Births                   Home Medications    Prior to Admission medications   Medication Sig Start Date End Date Taking? Authorizing Provider  cholecalciferol (VITAMIN D) 1000 units tablet Take 1,000 Units by mouth daily.    [provider]  dicyclomine (BENTYL) 20 MG tablet Take 1 tablet (20 mg total) by mouth every 6 (six) hours as needed for spasms (abdominal cramping). Patient not taking: Reported on 06/07/2017 05/04/17   Francine Graven, DO  diphenhydrAMINE (BENADRYL) 25 mg capsule Take 50 mg by mouth at bedtime as needed for allergies or sleep.    [provider]  doxycycline (VIBRAMYCIN) 100 MG capsule Take 1 capsule (100 mg total) by mouth 2 (two) times daily. 06/25/17   Evalee Jefferson, PA-C  escitalopram (LEXAPRO) 20 MG tablet Take 20 mg by mouth at bedtime.     [provider]  hyoscyamine (LEVSIN SL) 0.125 MG SL tablet Place 1 tablet (0.125 mg total) under the tongue every 4 (four) hours as needed. 01/28/17   Mahala Menghini, PA-C  levothyroxine (SYNTHROID, LEVOTHROID) 25 MCG tablet Take 1 tablet (25 mcg total) by mouth daily before breakfast. 02/26/17   Nida, Marella Chimes, MD  metoprolol tartrate (LOPRESSOR) 25 MG tablet Take 1 tablet (25 mg total) by mouth 2 (two) times daily. Patient taking differently: Take 25 mg by mouth every morning.  08/12/16   Cassandria Anger, MD  Multiple Vitamin (MULTIVITAMIN WITH MINERALS) TABS Take 1 tablet by mouth daily.    [provider]  Omega-3 Fatty Acids (FISH OIL) 1200 MG  CAPS Take 1 capsule by mouth daily.    [provider]  Omeprazole 20 MG TBEC Take 1 tablet (20 mg total) by mouth daily before breakfast. 01/28/17   Mahala Menghini, PA-C  ondansetron (ZOFRAN ODT) 4 MG disintegrating tablet Take 1 tablet (4 mg total) by mouth every 8 (eight) hours as needed for nausea or vomiting. 05/04/17   Francine Graven, DO  ondansetron (ZOFRAN) 4 MG tablet Take 1 tablet (4 mg total) by mouth every 8 (eight) hours as needed for nausea or vomiting. Patient not taking: Reported on 06/07/2017 05/04/17   Mahala Menghini, PA-C  pseudoephedrine-acetaminophen (TYLENOL SINUS) 30-500 MG TABS tablet Take 1 tablet by mouth every 4 (four) hours as needed.    [provider]    Family History Family History  Problem Relation Age of Onset  . Hypertension Father   . Diabetes Father   . Depression Other   . Uterine cancer Mother        ?cervical cancer  . Uterine cancer Sister        suicide  . Colon cancer Paternal Aunt 62    Social History Social History  Substance Use Topics  . Smoking status: Former Smoker    Packs/day: 1.00    Years: 3.00    Types: Cigarettes    Quit date: 11/03/1997  . Smokeless tobacco: Never Used  . Alcohol use No     Allergies   Levofloxacin and Penicillins   Review of Systems Review of Systems  Constitutional: Positive for chills.  HENT: Positive for congestion, ear pain and rhinorrhea. Negative for sore throat.   Respiratory: Positive for cough.   Cardiovascular: Negative for chest pain.  Musculoskeletal: Negative for neck pain.  Neurological: Positive for headaches.     Physical Exam Updated Vital Signs BP 131/84 (BP Location: Right Arm)   Pulse 77   Temp 98.8 F (37.1 C)   Resp 18   Ht 5\' 4"  (1.626 m)   Wt 103.9 kg (229 lb)   LMP 10/09/2014   SpO2 96%   BMI 39.31 kg/m   Physical Exam  Constitutional: She is oriented to person, place, and time. She appears well-developed and well-nourished.  HENT:  Head:  Normocephalic and atraumatic.  Right Ear: Ear canal normal. No mastoid tenderness. Tympanic membrane is bulging. Tympanic membrane is not erythematous.  Left Ear: Ear canal normal. No mastoid tenderness. Tympanic membrane is bulging. Tympanic membrane is not erythematous.  Nose: Mucosal edema and rhinorrhea present. Right sinus exhibits frontal sinus tenderness. Left sinus exhibits frontal sinus tenderness.  Mouth/Throat: Uvula is midline, oropharynx is clear and moist and mucous membranes are normal. No oropharyngeal exudate, posterior oropharyngeal edema, posterior oropharyngeal erythema or tonsillar abscesses.  Eyes: Conjunctivae are normal.  Cardiovascular: Normal rate and normal heart sounds.  Pulmonary/Chest: Effort normal. No respiratory distress. She has no wheezes. She has no rales.  Musculoskeletal: Normal range of motion.  Neurological: She is alert and oriented to person, place, and time.  Skin: Skin is warm and dry. No rash noted.  Psychiatric: She has a normal mood and affect.     ED Treatments / Results  Labs (all labs ordered are listed, but only abnormal results are displayed) Labs Reviewed - No data to display  EKG  EKG Interpretation None       Radiology No results found.  Procedures Procedures (including critical care time)  Medications Ordered in ED Medications - No data to display   Initial Impression / Assessment and Plan / ED Course  I have reviewed the triage vital signs and the nursing notes.  Pertinent labs & imaging results that were available during my care of the patient were reviewed by me and considered in my medical decision making (see chart for details).     Exam c/w sinusitis which has not responded to decongestant use.  Discussed other decongestion strategies, doxycycline prescribed, advised f/u with pcp if sx not improved with tx.  Final Clinical Impressions(s) / ED Diagnoses   Final diagnoses:  Acute non-recurrent frontal  sinusitis    New Prescriptions Discharge Medication List as of 06/25/2017 10:46 PM    START taking these medications   Details  doxycycline (VIBRAMYCIN) 100 MG capsule Take 1 capsule (100 mg total) by mouth 2 (two) times daily., Starting Thu 06/25/2017, Print         Evalee Jefferson, PA-C 06/26/17 1219    Virgel Manifold, MD 06/26/17 1239

## 2017-08-05 ENCOUNTER — Ambulatory Visit: Payer: Self-pay | Admitting: Gastroenterology

## 2017-08-15 ENCOUNTER — Other Ambulatory Visit: Payer: Self-pay | Admitting: "Endocrinology

## 2017-08-17 ENCOUNTER — Ambulatory Visit: Payer: Self-pay | Admitting: "Endocrinology

## 2017-09-09 ENCOUNTER — Telehealth: Payer: Self-pay | Admitting: "Endocrinology

## 2017-09-09 MED ORDER — LEVOTHYROXINE SODIUM 25 MCG PO TABS: 25.0000 ug | ORAL_TABLET | Freq: Every day | ORAL | 0 refills | Status: DC

## 2017-09-09 NOTE — Telephone Encounter (Signed)
Heather Fry had to r/s her appointment until January due to insurance, she is asking if Dr. Dorris Fetch would refill her levothyroxine (SYNTHROID, LEVOTHROID) 25 MCG tablet until that time please advise?

## 2017-09-11 ENCOUNTER — Ambulatory Visit: Payer: Self-pay | Admitting: "Endocrinology

## 2017-10-16 ENCOUNTER — Ambulatory Visit: Payer: Self-pay | Admitting: "Endocrinology

## 2017-10-21 ENCOUNTER — Other Ambulatory Visit: Payer: Self-pay | Admitting: "Endocrinology

## 2017-10-21 DIAGNOSIS — E039 Hypothyroidism, unspecified: Secondary | ICD-10-CM

## 2017-10-22 LAB — TSH: TSH: 0.01 m[IU]/L — AB

## 2017-10-22 LAB — T4, FREE: Free T4: 1.3 ng/dL (ref 0.8–1.8)

## 2017-11-13 ENCOUNTER — Ambulatory Visit (INDEPENDENT_AMBULATORY_CARE_PROVIDER_SITE_OTHER): Payer: Self-pay | Admitting: "Endocrinology

## 2017-11-13 ENCOUNTER — Encounter: Payer: Self-pay | Admitting: "Endocrinology

## 2017-11-13 VITALS — BP 123/86 | HR 74 | Ht 64.0 in | Wt 219.0 lb

## 2017-11-13 DIAGNOSIS — E039 Hypothyroidism, unspecified: Secondary | ICD-10-CM

## 2017-11-13 MED ORDER — LEVOTHYROXINE SODIUM 25 MCG PO TABS
25.0000 ug | ORAL_TABLET | Freq: Every day | ORAL | 1 refills | Status: DC
Start: 2017-11-13 — End: 2017-12-15

## 2017-11-13 NOTE — Progress Notes (Signed)
Subjective:    Patient ID: Heather Fry, female    DOB: 09-06-1976, PCP Redmond School, MD   Past Medical History:  Diagnosis Date  . Anxiety   . Depression   . Diverticulosis   . GERD (gastroesophageal reflux disease)   . Internal hemorrhoids   . Uterine fibroid    Past Surgical History:  Procedure Laterality Date  . ABDOMINAL HYSTERECTOMY     Per patient, uterine fibroids.  . ABDOMINAL HYSTERECTOMY    . CHOLECYSTECTOMY N/A 11/06/2014   Procedure: LAPAROSCOPIC CHOLECYSTECTOMY;  Surgeon: Jamesetta So, MD;  Location: [facility];  Service: General;  Laterality: N/A;  . COLONOSCOPY N/A 03/04/2017   Procedure: COLONOSCOPY;  Surgeon: Daneil Dolin, MD;  Location: [facility];  Service: Endoscopy;  Laterality: N/A;  8:30am  . DILATION AND CURETTAGE OF UTERUS    . ESOPHAGOGASTRODUODENOSCOPY N/A 10/02/2014   Procedure: ESOPHAGOGASTRODUODENOSCOPY (EGD);  Surgeon: Daneil Dolin, MD;  Location: [facility];  Service: Endoscopy;  Laterality: N/A;  945am - moved to 8:45 - Ginger notified pt  . MOUTH SURGERY     extraction of teeth  . POLYPECTOMY  03/04/2017   Procedure: POLYPECTOMY;  Surgeon: Daneil Dolin, MD;  Location: [facility];  Service: Endoscopy;;  colon  . TUBAL LIGATION     Social History   Socioeconomic History  . Marital status: Married    Spouse name: None  . Number of children: None  . Years of education: None  . Highest education level: None  Social Needs  . Financial resource strain: None  . Food insecurity - worry: None  . Food insecurity - inability: None  . Transportation needs - medical: None  . Transportation needs - non-medical: None  Occupational History  . None  Tobacco Use  . Smoking status: Former Smoker    Packs/day: 1.00    Years: 3.00    Pack years: 3.00    Types: Cigarettes    Last attempt to quit: 11/03/1997    Years since quitting: 20.0  . Smokeless tobacco: Never Used  Substance and Sexual Activity  . Alcohol use: No     Alcohol/week: 0.0 oz  . Drug use: No  . Sexual activity: Yes    Birth control/protection: Surgical  Other Topics Concern  . None  Social History Narrative  . None   Outpatient Encounter Medications as of 11/13/2017  Medication Sig  . cholecalciferol (VITAMIN D) 1000 units tablet Take 1,000 Units by mouth daily.  . diphenhydrAMINE (BENADRYL) 25 mg capsule Take 50 mg by mouth at bedtime as needed for allergies or sleep.  Marland Kitchen doxycycline (VIBRAMYCIN) 100 MG capsule Take 1 capsule (100 mg total) by mouth 2 (two) times daily.  Marland Kitchen escitalopram (LEXAPRO) 20 MG tablet Take 20 mg by mouth at bedtime.   . hyoscyamine (LEVSIN SL) 0.125 MG SL tablet Place 1 tablet (0.125 mg total) under the tongue every 4 (four) hours as needed.  Marland Kitchen levothyroxine (SYNTHROID, LEVOTHROID) 25 MCG tablet Take 1 tablet (25 mcg total) by mouth daily before breakfast.  . metoprolol tartrate (LOPRESSOR) 25 MG tablet TAKE 1 TABLET (25 MG TOTAL) BY MOUTH 2 (TWO) TIMES DAILY.  . Multiple Vitamin (MULTIVITAMIN WITH MINERALS) TABS Take 1 tablet by mouth daily.  . Omega-3 Fatty Acids (FISH OIL) 1200 MG CAPS Take 1 capsule by mouth daily.  . Omeprazole 20 MG TBEC Take 1 tablet (20 mg total) by mouth daily before breakfast.  . ondansetron (ZOFRAN ODT) 4  MG disintegrating tablet Take 1 tablet (4 mg total) by mouth every 8 (eight) hours as needed for nausea or vomiting.  . pseudoephedrine-acetaminophen (TYLENOL SINUS) 30-500 MG TABS tablet Take 1 tablet by mouth every 4 (four) hours as needed.  . [DISCONTINUED] dicyclomine (BENTYL) 20 MG tablet Take 1 tablet (20 mg total) by mouth every 6 (six) hours as needed for spasms (abdominal cramping). (Patient not taking: Reported on 06/07/2017)  . [DISCONTINUED] levothyroxine (SYNTHROID, LEVOTHROID) 25 MCG tablet Take 1 tablet (25 mcg total) by mouth daily before breakfast.  . [DISCONTINUED] ondansetron (ZOFRAN) 4 MG tablet Take 1 tablet (4 mg total) by mouth every 8 (eight) hours as needed for nausea  or vomiting. (Patient not taking: Reported on 06/07/2017)   No facility-administered encounter medications on file as of 11/13/2017.    ALLERGIES: Allergies  Allergen Reactions  . Levofloxacin Other (See Comments)    Pt can not have due to taking Lexapro   . Penicillins Other (See Comments)    Loopy Has patient had a PCN reaction causing immediate rash, facial/tongue/throat swelling, SOB or lightheadedness with hypotension: No Has patient had a PCN reaction causing severe rash involving mucus membranes or skin necrosis: No Has patient had a PCN reaction that required hospitalization: No Has patient had a PCN reaction occurring within the last 10 years: No If all of the above answers are "NO", then may proceed with Cephalosporin use.  "knocked out.     VACCINATION STATUS:  There is no immunization history on file for this patient.  HPI 42 year old female patient with medical history as above. 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  She is on levothyroxine 25 mcg p.o. every morning.  She reports compliance with this medication.  She has no new complaints.    - She has steady weight loss of 20 pounds since May 2018.  She denies heat intolerance, tremors, palpitations  -She reports feeling better.   She is status post partial hysterectomy for symptomatic fibroids.  - She denies any family history of thyroid dysfunction. She denies any personal history of goiter. She is not on any thyroid hormone supplement.  Review of Systems Constitutional:  + steady weight loss of 20 lbs , no fatigue, no subjective hyperthermia/hypothermic.   Eyes: no blurry vision, no xerophthalmia ENT: no sore throat, no nodules palpated in throat, no dysphagia/odynophagia, no hoarseness Cardiovascular: No chest pain, no palpitations  Respiratory: no cough/SOB Gastrointestinal: no N/V/D/C Musculoskeletal: no muscle/joint aches Skin: no rashes Neurological: No tremors, no tingling.    Psychiatric: no depression/anxiety  Objective:    BP 123/86   Pulse 74   Ht 5\' 4"  (1.626 m)   Wt 219 lb (99.3 kg)   LMP 10/09/2014   BMI 37.59 kg/m   Wt Readings from Last 3 Encounters:  11/13/17 219 lb (99.3 kg)  06/25/17 229 lb (103.9 kg)  06/07/17 234 lb (106.1 kg)    Physical Exam  Constitutional: + Obese, not in acute distress.  Eyes: PERRLA, EOMI, no exophthalmos ENT: moist mucous membranes, no thyromegaly, no cervical lymphadenopathy Cardiovascular: RRR, No MRG Respiratory: CTA B Gastrointestinal: abdomen soft, NT, ND, BS+ Musculoskeletal: no deformities, strength intact in all 4 Skin: moist, + tattoo, warm, no rashes Neurological: No tremors of outstretched hands, DTR reflexes are within normal limits on bilateral lower extremities.    CMP     Component Value Date/Time   NA 139 06/07/2017 1006   K 4.0 06/07/2017 1006   CL 103 06/07/2017 1006  CO2 26 06/07/2017 1006   GLUCOSE 106 (H) 06/07/2017 1006   BUN 8 06/07/2017 1006   CREATININE 0.53 06/07/2017 1006   CALCIUM 9.7 06/07/2017 1006   PROT 7.8 06/07/2017 1006   ALBUMIN 4.0 06/07/2017 1006   AST 20 06/07/2017 1006   ALT 22 06/07/2017 1006   ALKPHOS 62 06/07/2017 1006   BILITOT 0.4 06/07/2017 1006   GFRNONAA >60 06/07/2017 1006   GFRAA >60 06/07/2017 1006   Results for DACIA, CAPERS (MRN [id number]) as of 11/13/2017 11:12  Ref. Range 02/05/2017 15:11 10/21/2017 15:07  TSH Latest Units: mIU/L 0.01 (L) 0.01 (L)  T4,Free(Direct) Latest Ref Range: 0.8 - 1.8 ng/dL 1.4 1.3   Labs from 12/23/2016 showed free T4 0.75 (normal 0.8 2-1.77) TSH 0.16 EKG was normal sinus  Assessment & Plan:   1.  Secondary hypothyroidism - Her free T4 is 1.3 improving from 0.75. Her's is  secondary hypothyroidism.  Her treatment adjustment will be based on free T4 levels and not on TSH.  -Advised her to continue levothyroxine 25 mcg p.o. every morning.    - We discussed about correct intake of levothyroxine, at fasting,  with water, separated by at least 30 minutes from breakfast, and separated by more than 4 hours from calcium, iron, multivitamins, acid reflux medications (PPIs). -Patient is made aware of the fact that thyroid hormone replacement is needed for life, dose to be adjusted by periodic monitoring of thyroid function tests. - Her prior thyroid uptake and scan is unremarkable at 8%.    -She does not have clinical goiter hence no need for imaging studies.  - I advised patient to maintain close follow up with Redmond School, MD for primary care needs. Follow up plan: Return in about 6 months (around 05/13/2018) for follow up with pre-visit labs.  Glade Lloyd, MD Phone: [phone]  Fax: [phone]   -  This note was partially dictated with voice recognition software. Similar sounding words can be transcribed inadequately or may not  be corrected upon review.  11/13/2017, 11:08 AM

## 2017-11-27 ENCOUNTER — Encounter (HOSPITAL_COMMUNITY): Payer: Self-pay | Admitting: Emergency Medicine

## 2017-11-27 ENCOUNTER — Emergency Department (HOSPITAL_COMMUNITY)
Admission: EM | Admit: 2017-11-27 | Discharge: 2017-11-27 | Disposition: A | Discharge status: Home / Self Care | Payer: BLUE CROSS/BLUE SHIELD | Attending: Emergency Medicine | Admitting: Emergency Medicine

## 2017-11-27 DIAGNOSIS — Z87891 Personal history of nicotine dependence: Secondary | ICD-10-CM | POA: Insufficient documentation

## 2017-11-27 DIAGNOSIS — R7302 Impaired glucose tolerance (oral): Secondary | ICD-10-CM | POA: Insufficient documentation

## 2017-11-27 DIAGNOSIS — E7439 Other disorders of intestinal carbohydrate absorption: Secondary | ICD-10-CM

## 2017-11-27 DIAGNOSIS — Z79899 Other long term (current) drug therapy: Secondary | ICD-10-CM | POA: Insufficient documentation

## 2017-11-27 DIAGNOSIS — R002 Palpitations: Secondary | ICD-10-CM | POA: Diagnosis not present

## 2017-11-27 LAB — CBC WITH DIFFERENTIAL/PLATELET
BASOS ABS: 0 10*3/uL (ref 0.0–0.1)
Basophils Relative: 0 %
Eosinophils Absolute: 0.1 10*3/uL (ref 0.0–0.7)
Eosinophils Relative: 1 %
HCT: 41.1 % (ref 36.0–46.0)
HEMOGLOBIN: 13.3 g/dL (ref 12.0–15.0)
LYMPHS ABS: 1.8 10*3/uL (ref 0.7–4.0)
LYMPHS PCT: 21 %
MCH: 28.9 pg (ref 26.0–34.0)
MCHC: 32.4 g/dL (ref 30.0–36.0)
MCV: 89.2 fL (ref 78.0–100.0)
Monocytes Absolute: 0.4 10*3/uL (ref 0.1–1.0)
Monocytes Relative: 5 %
NEUTROS ABS: 6.1 10*3/uL (ref 1.7–7.7)
Neutrophils Relative %: 73 %
Platelets: 268 10*3/uL (ref 150–400)
RBC: 4.61 MIL/uL (ref 3.87–5.11)
RDW: 13.4 % (ref 11.5–15.5)
WBC: 8.4 10*3/uL (ref 4.0–10.5)

## 2017-11-27 LAB — URINALYSIS, ROUTINE W REFLEX MICROSCOPIC
Bilirubin Urine: NEGATIVE
GLUCOSE, UA: NEGATIVE mg/dL
Hgb urine dipstick: NEGATIVE
KETONES UR: NEGATIVE mg/dL
LEUKOCYTES UA: NEGATIVE
NITRITE: NEGATIVE
PH: 8 (ref 5.0–8.0)
PROTEIN: NEGATIVE mg/dL
Specific Gravity, Urine: 1.016 (ref 1.005–1.030)

## 2017-11-27 LAB — BASIC METABOLIC PANEL
ANION GAP: 11 (ref 5–15)
BUN: 10 mg/dL (ref 6–20)
CHLORIDE: 105 mmol/L (ref 101–111)
CO2: 24 mmol/L (ref 22–32)
Calcium: 9.3 mg/dL (ref 8.9–10.3)
Creatinine, Ser: 0.6 mg/dL (ref 0.44–1.00)
GFR calc Af Amer: >60 mL/min (ref >60)
GLUCOSE: 137 mg/dL — AB (ref 65–99)
POTASSIUM: 4.1 mmol/L (ref 3.5–5.1)
Sodium: 140 mmol/L (ref 135–145)

## 2017-11-27 LAB — TROPONIN I: Troponin I: <0.03 ng/mL (ref <0.03)

## 2017-11-27 LAB — TSH: TSH: <0.01 u[IU]/mL — ABNORMAL LOW (ref 0.350–4.500)

## 2017-11-27 LAB — CBG MONITORING, ED: GLUCOSE-CAPILLARY: 113 mg/dL — AB (ref 65–99)

## 2017-11-27 NOTE — Discharge Instructions (Signed)
You might consider decreasing your thyroid medicine for several days to reassess how you are feeling.  Try to avoid white flour and sugar.  These will make your glucose elevate and then crash.  Follow-up with your primary care doctor.

## 2017-11-27 NOTE — ED Notes (Signed)
Pt understood dc material. NAD Noted 

## 2017-11-27 NOTE — ED Triage Notes (Signed)
Pt reports she is very anxious about her glucose levels which have been going up and down.  States they have been as high as 140 (after eating) and then goes into the 80s.  Pt very anxious at triage.  Education done at triage regarding glucose levels.

## 2017-11-27 NOTE — ED Provider Notes (Signed)
[facility] EMERGENCY DEPARTMENT Provider Note   CSN: [id number] Arrival date & time: 11/27/17  1813     History   Chief Complaint Chief Complaint  Patient presents with  . Anxiety    HPI Heather Fry is a 42 y.o. female.  Patient is concerned about her glucose levels.  Today she felt shaky with palpitations and her glucose was 75.  It has been as high as high as 140.  Additionally, she takes levothyroxine 25 mcg/day for decreased thyroid function.  She sees a local endocrinologist for this.  Glucose swings make her anxious.  Review of systems positive for vague rare intermittent chest pain.  No dyspnea, diaphoresis, nausea, fever, sweats, chills, dysuria.  Severity of symptoms is mild.      Past Medical History:  Diagnosis Date  . Anxiety   . Depression   . Diverticulosis   . GERD (gastroesophageal reflux disease)   . Internal hemorrhoids   . Uterine fibroid     Patient Active Problem List   Diagnosis Date Noted  . GERD (gastroesophageal reflux disease) 01/28/2017  . Rectal bleeding 01/28/2017  . 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 Hypothyroidism 04/14/2016  . Gastric polyp   . RUQ abdominal pain 09/06/2014  . Excessive or frequent menstruation 01/13/2013  . Depression     Past Surgical History:  Procedure Laterality Date  . ABDOMINAL HYSTERECTOMY     Per patient, uterine fibroids.  . ABDOMINAL HYSTERECTOMY    . CHOLECYSTECTOMY N/A 11/06/2014   Procedure: LAPAROSCOPIC CHOLECYSTECTOMY;  Surgeon: Jamesetta So, MD;  Location: [facility];  Service: General;  Laterality: N/A;  . COLONOSCOPY N/A 03/04/2017   Procedure: COLONOSCOPY;  Surgeon: Daneil Dolin, MD;  Location: [facility];  Service: Endoscopy;  Laterality: N/A;  8:30am  . DILATION AND CURETTAGE OF UTERUS    . ESOPHAGOGASTRODUODENOSCOPY N/A 10/02/2014   Procedure: ESOPHAGOGASTRODUODENOSCOPY (EGD);  Surgeon: Daneil Dolin, MD;  Location: [facility];  Service: Endoscopy;  Laterality: N/A;  945am - moved to  8:45 - Ginger notified pt  . MOUTH SURGERY     extraction of teeth  . POLYPECTOMY  03/04/2017   Procedure: POLYPECTOMY;  Surgeon: Daneil Dolin, MD;  Location: [facility];  Service: Endoscopy;;  colon  . TUBAL LIGATION      OB History    Gravida Para Term Preterm AB Living   2 2 2     2    SAB TAB Ectopic Multiple Live Births                   Home Medications    Prior to Admission medications   Medication Sig Start Date End Date Taking? Authorizing Provider  cholecalciferol (VITAMIN D) 1000 units tablet Take 1,000 Units by mouth daily.   Yes [provider]  diphenhydrAMINE (BENADRYL) 25 mg capsule Take 50 mg by mouth at bedtime as needed for allergies or sleep.   Yes [provider]  escitalopram (LEXAPRO) 20 MG tablet Take 20 mg by mouth at bedtime.    Yes [provider]  FIBER PO Take 2 capsules by mouth daily.   Yes [provider]  hyoscyamine (LEVSIN SL) 0.125 MG SL tablet Place 1 tablet (0.125 mg total) under the tongue every 4 (four) hours as needed. 01/28/17  Yes Mahala Menghini, PA-C  levothyroxine (SYNTHROID, LEVOTHROID) 25 MCG tablet Take 1 tablet (25 mcg total) by mouth daily before breakfast. 11/13/17  Yes Nida, Marella Chimes, MD  metoprolol  tartrate (LOPRESSOR) 25 MG tablet TAKE 1 TABLET (25 MG TOTAL) BY MOUTH 2 (TWO) TIMES DAILY. Patient taking differently: Take 25 mg by mouth daily.  08/17/17  Yes Cassandria Anger, MD  Multiple Vitamin (MULTIVITAMIN WITH MINERALS) TABS Take 1 tablet by mouth daily.   Yes [provider]  Omega-3 Fatty Acids (FISH OIL) 1200 MG CAPS Take 1 capsule by mouth daily.   Yes [provider]  Omeprazole 20 MG TBEC Take 1 tablet (20 mg total) by mouth daily before breakfast. 01/28/17  Yes Mahala Menghini, PA-C  pseudoephedrine-acetaminophen (TYLENOL SINUS) 30-500 MG TABS tablet Take 1 tablet by mouth every 4 (four) hours as needed.   Yes [provider]  doxycycline  (VIBRAMYCIN) 100 MG capsule Take 1 capsule (100 mg total) by mouth 2 (two) times daily. Patient not taking: Reported on 11/27/2017 06/25/17   Evalee Jefferson, PA-C    Family History Family History  Problem Relation Age of Onset  . Hypertension Father   . Diabetes Father   . Depression Other   . Uterine cancer Mother        ?cervical cancer  . Uterine cancer Sister        suicide  . Colon cancer Paternal Aunt 36    Social History Social History   Tobacco Use  . Smoking status: Former Smoker    Packs/day: 1.00    Years: 3.00    Pack years: 3.00    Types: Cigarettes    Last attempt to quit: 11/03/1997    Years since quitting: 20.0  . Smokeless tobacco: Never Used  Substance Use Topics  . Alcohol use: No    Alcohol/week: 0.0 oz  . Drug use: No     Allergies   Levofloxacin and Penicillins   Review of Systems Review of Systems  All other systems reviewed and are negative.    Physical Exam Updated Vital Signs BP 121/63   Pulse 87   Temp 98 F (36.7 C) (Oral)   Resp 20   Ht 5\' 4"  (1.626 m)   Wt 99.3 kg (219 lb)   LMP 10/09/2014   SpO2 97%   BMI 37.59 kg/m   Physical Exam  Constitutional: She is oriented to person, place, and time. She appears well-developed and well-nourished.  HENT:  Head: Normocephalic and atraumatic.  Eyes: Conjunctivae are normal.  Neck: Neck supple.  Cardiovascular: Normal rate and regular rhythm.  Pulmonary/Chest: Effort normal and breath sounds normal.  Abdominal: Soft. Bowel sounds are normal.  Musculoskeletal: Normal range of motion.  Neurological: She is alert and oriented to person, place, and time.  Skin: Skin is warm and dry.  Psychiatric: She has a normal mood and affect. Her behavior is normal.  Nursing note and vitals reviewed.    ED Treatments / Results  Labs (all labs ordered are listed, but only abnormal results are displayed) Labs Reviewed  BASIC METABOLIC PANEL - Abnormal; Notable for the following components:       Result Value   Glucose, Bld 137 (*)    All other components within normal limits  TSH - Abnormal; Notable for the following components:   TSH <0.010 (*)    All other components within normal limits  CBG MONITORING, ED - Abnormal; Notable for the following components:   Glucose-Capillary 113 (*)    All other components within normal limits  CBC WITH DIFFERENTIAL/PLATELET  TROPONIN I  URINALYSIS, ROUTINE W REFLEX MICROSCOPIC    EKG  EKG Interpretation  Date/Time:  Friday November 27 2017 19:21:20 EST Ventricular Rate:  89 PR Interval:    QRS Duration: 79 QT Interval:  381 QTC Calculation: 464 R Axis:   49 Text Interpretation:  Sinus rhythm Low voltage, precordial leads Confirmed by Nat Christen [phone]) on 11/27/2017 7:41:14 PM       Radiology No results found.  Procedures Procedures (including critical care time)  Medications Ordered in ED Medications - No data to display   Initial Impression / Assessment and Plan / ED Course  I have reviewed the triage vital signs and the nursing notes.  Pertinent labs & imaging results that were available during my care of the patient were reviewed by me and considered in my medical decision making (see chart for details).     Patient has a normal physical exam.  Glucose was 137.  TSH suboptimal.  EKG normal.  Recommend decreasing her Synthroid dose to one half the normal level follow-up with her endocrinologist.  Final Clinical Impressions(s) / ED Diagnoses   Final diagnoses:  Glucose intolerance    ED Discharge Orders    None       Nat Christen, MD 11/28/17 [phone]

## 2017-12-15 ENCOUNTER — Ambulatory Visit (INDEPENDENT_AMBULATORY_CARE_PROVIDER_SITE_OTHER): Payer: BLUE CROSS/BLUE SHIELD | Admitting: "Endocrinology

## 2017-12-15 ENCOUNTER — Encounter: Payer: Self-pay | Admitting: "Endocrinology

## 2017-12-15 VITALS — BP 138/80 | HR 77 | Ht 64.0 in | Wt 220.0 lb

## 2017-12-15 DIAGNOSIS — E039 Hypothyroidism, unspecified: Secondary | ICD-10-CM

## 2017-12-15 NOTE — Progress Notes (Signed)
Subjective:    Patient ID: Heather Fry, female    DOB: July 24, 1976, PCP Redmond School, MD   Past Medical History:  Diagnosis Date  . Anxiety   . Depression   . Diverticulosis   . GERD (gastroesophageal reflux disease)   . Internal hemorrhoids   . Uterine fibroid    Past Surgical History:  Procedure Laterality Date  . ABDOMINAL HYSTERECTOMY     Per patient, uterine fibroids.  . ABDOMINAL HYSTERECTOMY    . CHOLECYSTECTOMY N/A 11/06/2014   Procedure: LAPAROSCOPIC CHOLECYSTECTOMY;  Surgeon: Jamesetta So, MD;  Location: [facility];  Service: General;  Laterality: N/A;  . COLONOSCOPY N/A 03/04/2017   Procedure: COLONOSCOPY;  Surgeon: Daneil Dolin, MD;  Location: [facility];  Service: Endoscopy;  Laterality: N/A;  8:30am  . DILATION AND CURETTAGE OF UTERUS    . ESOPHAGOGASTRODUODENOSCOPY N/A 10/02/2014   Procedure: ESOPHAGOGASTRODUODENOSCOPY (EGD);  Surgeon: Daneil Dolin, MD;  Location: [facility];  Service: Endoscopy;  Laterality: N/A;  945am - moved to 8:45 - Heather Fry notified pt  . MOUTH SURGERY     extraction of teeth  . POLYPECTOMY  03/04/2017   Procedure: POLYPECTOMY;  Surgeon: Daneil Dolin, MD;  Location: [facility];  Service: Endoscopy;;  colon  . TUBAL LIGATION     Social History   Socioeconomic History  . Marital status: Married    Spouse name: None  . Number of children: None  . Years of education: None  . Highest education level: None  Social Needs  . Financial resource strain: None  . Food insecurity - worry: None  . Food insecurity - inability: None  . Transportation needs - medical: None  . Transportation needs - non-medical: None  Occupational History  . None  Tobacco Use  . Smoking status: Former Smoker    Packs/day: 1.00    Years: 3.00    Pack years: 3.00    Types: Cigarettes    Last attempt to quit: 11/03/1997    Years since quitting: 20.1  . Smokeless tobacco: Never Used  Substance and Sexual Activity  . Alcohol use: No     Alcohol/week: 0.0 oz  . Drug use: No  . Sexual activity: Yes    Birth control/protection: Surgical  Other Topics Concern  . None  Social History Narrative  . None   Outpatient Encounter Medications as of 12/15/2017  Medication Sig  . cholecalciferol (VITAMIN D) 1000 units tablet Take 1,000 Units by mouth daily.  . diphenhydrAMINE (BENADRYL) 25 mg capsule Take 50 mg by mouth at bedtime as needed for allergies or sleep.  Marland Kitchen doxycycline (VIBRAMYCIN) 100 MG capsule Take 1 capsule (100 mg total) by mouth 2 (two) times daily. (Patient not taking: Reported on 11/27/2017)  . escitalopram (LEXAPRO) 20 MG tablet Take 20 mg by mouth at bedtime.   Marland Kitchen FIBER PO Take 2 capsules by mouth daily.  . hyoscyamine (LEVSIN SL) 0.125 MG SL tablet Place 1 tablet (0.125 mg total) under the tongue every 4 (four) hours as needed.  . metoprolol tartrate (LOPRESSOR) 25 MG tablet TAKE 1 TABLET (25 MG TOTAL) BY MOUTH 2 (TWO) TIMES DAILY. (Patient taking differently: Take 25 mg by mouth daily. )  . Multiple Vitamin (MULTIVITAMIN WITH MINERALS) TABS Take 1 tablet by mouth daily.  . Omega-3 Fatty Acids (FISH OIL) 1200 MG CAPS Take 1 capsule by mouth daily.  . Omeprazole 20 MG TBEC Take 1 tablet (20 mg total) by mouth daily before  breakfast.  . pseudoephedrine-acetaminophen (TYLENOL SINUS) 30-500 MG TABS tablet Take 1 tablet by mouth every 4 (four) hours as needed.  . [DISCONTINUED] levothyroxine (SYNTHROID, LEVOTHROID) 25 MCG tablet Take 1 tablet (25 mcg total) by mouth daily before breakfast.   No facility-administered encounter medications on file as of 12/15/2017.    ALLERGIES: Allergies  Allergen Reactions  . Levofloxacin Other (See Comments)    Pt can not have due to taking Lexapro   . Penicillins Other (See Comments)    Loopy Has patient had a PCN reaction causing immediate rash, facial/tongue/throat swelling, SOB or lightheadedness with hypotension: No Has patient had a PCN reaction causing severe rash involving  mucus membranes or skin necrosis: No Has patient had a PCN reaction that required hospitalization: No Has patient had a PCN reaction occurring within the last 10 years: No If all of the above answers are "NO", then may proceed with Cephalosporin use.  "knocked out.     VACCINATION STATUS:  There is no immunization history on file for this patient.  HPI 42 year old female patient with medical history as above. 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  She is on low-dose of levothyroxine 25 mcg p.o. every morning.  She is compliant with his medication.  -She has a steady weight since last visit.  She denies palpitations, tremors, heat intolerance.     She is status post partial hysterectomy for symptomatic fibroids.  - She denies any family history of thyroid dysfunction. She denies any personal history of goiter. She is not on any thyroid hormone supplement.  Review of Systems Constitutional:  + steady weight , + fatigue, no subjective hyperthermia nor hypothymia.   Eyes: no blurry vision, no xerophthalmia ENT: no sore throat, no nodules palpated in throat, no dysphagia/odynophagia, no hoarseness Cardiovascular: Chest pain, no palpitations. Respiratory: no cough/SOB Gastrointestinal: no N/V/D/C Musculoskeletal: no muscle/joint aches Skin: no rashes Neurological: No tremors, no tingling.   Psychiatric: no depression/anxiety  Objective:    BP 138/80   Pulse 77   Ht 5\' 4"  (1.626 m)   Wt 220 lb (99.8 kg)   LMP 10/09/2014   BMI 37.76 kg/m   Wt Readings from Last 3 Encounters:  12/15/17 220 lb (99.8 kg)  11/27/17 219 lb (99.3 kg)  11/13/17 219 lb (99.3 kg)    Physical Exam  Constitutional: + Obese, not in acute distress, normal state of mind. Eyes: PERRLA, EOMI, no exophthalmos ENT: moist mucous membranes, no thyromegaly, no cervical lymphadenopathy Musculoskeletal: no deformities, strength intact in all 4 Skin: moist, + tattoo, warm, no  rashes Neurological: No tremors of outstretched hands, DTR reflexes are within normal limits on bilateral lower extremities.    CMP     Component Value Date/Time   NA 140 11/27/2017 1904   K 4.1 11/27/2017 1904   CL 105 11/27/2017 1904   CO2 24 11/27/2017 1904   GLUCOSE 137 (H) 11/27/2017 1904   BUN 10 11/27/2017 1904   CREATININE 0.60 11/27/2017 1904   CALCIUM 9.3 11/27/2017 1904   PROT 7.8 06/07/2017 1006   ALBUMIN 4.0 06/07/2017 1006   AST 20 06/07/2017 1006   ALT 22 06/07/2017 1006   ALKPHOS 62 06/07/2017 1006   BILITOT 0.4 06/07/2017 1006   GFRNONAA >60 11/27/2017 1904   GFRAA >60 11/27/2017 1904   Results for Heather, Fry (MRN [id number]) as of 12/15/2017 13:38  Ref. Range 10/21/2017 15:07 11/27/2017 19:05  TSH Latest Ref Range: 0.350 - 4.500 uIU/mL 0.01 (L) <0.010 (  L)  T4,Free(Direct) Latest Ref Range: 0.8 - 1.8 ng/dL 1.3     Labs from 12/23/2016 showed free T4 0.75 (normal 0.8 2-1.77) TSH 0.16 EKG was normal sinus  Assessment & Plan:   1.  Secondary hypothyroidism -Her pre-visit  thyroid function test include only TSH which remains suppressed to below 0.01.  -At this time, I advised her to discontinue levothyroxine with plan to repeat full set of thyroid function test in 5 weeks. -We will continue to benefit from low-dose metoprolol 5 mg p.o. twice daily. - Her prior thyroid uptake and scan is unremarkable at 8%.   -She does not have clinical goiter hence no need for imaging studies.  - I advised patient to maintain close follow up with Redmond School, MD for primary care needs. Follow up plan: Return in about 6 weeks (around 01/26/2018) for follow up with pre-visit labs.  Glade Lloyd, MD Phone: [phone]  Fax: [phone]   -  This note was partially dictated with voice recognition software. Similar sounding words can be transcribed inadequately or may not  be corrected upon review.  12/15/2017, 1:35 PM

## 2017-12-30 ENCOUNTER — Telehealth: Payer: Self-pay | Admitting: "Endocrinology

## 2017-12-30 NOTE — Telephone Encounter (Signed)
Yes, she can.

## 2017-12-30 NOTE — Telephone Encounter (Signed)
Heather Fry is asking since she was taken off the Levothyroxine she is asking if she can take a higher dose of 10 mg of otc Claritan to protect her from the pollen, please advise?

## 2017-12-31 NOTE — Telephone Encounter (Signed)
Pt.notified

## 2018-01-07 ENCOUNTER — Other Ambulatory Visit: Payer: Self-pay | Admitting: "Endocrinology

## 2018-01-18 ENCOUNTER — Encounter (HOSPITAL_COMMUNITY): Payer: Self-pay | Admitting: *Deleted

## 2018-01-18 ENCOUNTER — Emergency Department (HOSPITAL_COMMUNITY)
Admission: EM | Admit: 2018-01-18 | Discharge: 2018-01-19 | Disposition: A | Discharge status: Home / Self Care | Payer: BLUE CROSS/BLUE SHIELD | Attending: Emergency Medicine | Admitting: Emergency Medicine

## 2018-01-18 ENCOUNTER — Other Ambulatory Visit: Payer: Self-pay

## 2018-01-18 DIAGNOSIS — E039 Hypothyroidism, unspecified: Secondary | ICD-10-CM | POA: Insufficient documentation

## 2018-01-18 DIAGNOSIS — N3090 Cystitis, unspecified without hematuria: Secondary | ICD-10-CM

## 2018-01-18 DIAGNOSIS — Z79899 Other long term (current) drug therapy: Secondary | ICD-10-CM | POA: Diagnosis not present

## 2018-01-18 DIAGNOSIS — Z87891 Personal history of nicotine dependence: Secondary | ICD-10-CM | POA: Diagnosis not present

## 2018-01-18 DIAGNOSIS — R109 Unspecified abdominal pain: Secondary | ICD-10-CM | POA: Diagnosis not present

## 2018-01-18 DIAGNOSIS — R5381 Other malaise: Secondary | ICD-10-CM | POA: Diagnosis not present

## 2018-01-18 DIAGNOSIS — R531 Weakness: Secondary | ICD-10-CM | POA: Diagnosis not present

## 2018-01-18 DIAGNOSIS — K529 Noninfective gastroenteritis and colitis, unspecified: Secondary | ICD-10-CM | POA: Diagnosis not present

## 2018-01-18 DIAGNOSIS — R42 Dizziness and giddiness: Secondary | ICD-10-CM | POA: Diagnosis not present

## 2018-01-18 HISTORY — DX: Disorder of thyroid, unspecified: E07.9

## 2018-01-18 LAB — BASIC METABOLIC PANEL
ANION GAP: 10 (ref 5–15)
BUN: 7 mg/dL (ref 6–20)
CALCIUM: 9.7 mg/dL (ref 8.9–10.3)
CO2: 26 mmol/L (ref 22–32)
Chloride: 100 mmol/L — ABNORMAL LOW (ref 101–111)
Creatinine, Ser: 0.6 mg/dL (ref 0.44–1.00)
GFR calc Af Amer: >60 mL/min (ref >60)
Glucose, Bld: 108 mg/dL — ABNORMAL HIGH (ref 65–99)
Potassium: 3.7 mmol/L (ref 3.5–5.1)
SODIUM: 136 mmol/L (ref 135–145)

## 2018-01-18 LAB — CBC
HCT: 44.9 % (ref 36.0–46.0)
HEMOGLOBIN: 14.7 g/dL (ref 12.0–15.0)
MCH: 29.3 pg (ref 26.0–34.0)
MCHC: 32.7 g/dL (ref 30.0–36.0)
MCV: 89.4 fL (ref 78.0–100.0)
PLATELETS: 271 10*3/uL (ref 150–400)
RBC: 5.02 MIL/uL (ref 3.87–5.11)
RDW: 13 % (ref 11.5–15.5)
WBC: 11.7 10*3/uL — AB (ref 4.0–10.5)

## 2018-01-18 LAB — URINALYSIS, ROUTINE W REFLEX MICROSCOPIC
Bacteria, UA: NONE SEEN
Bilirubin Urine: NEGATIVE
GLUCOSE, UA: NEGATIVE mg/dL
KETONES UR: NEGATIVE mg/dL
NITRITE: NEGATIVE
PH: 5 (ref 5.0–8.0)
PROTEIN: NEGATIVE mg/dL
Specific Gravity, Urine: 1.021 (ref 1.005–1.030)

## 2018-01-18 NOTE — ED Provider Notes (Signed)
[facility] EMERGENCY DEPARTMENT Provider Note   CSN: [id number] Arrival date & time: 01/18/18  1733     History   Chief Complaint Chief Complaint  Patient presents with  . Weakness    HPI Heather Fry is a 42 y.o. female.  Patient with history of hypothyroidism is completing her purge from her levothyroxine in preparation for re-evaluation of her thyroid function by Dr. Dorris Fetch. Over the last week, she endorses a feeling of general malaise, constant fatigue. Occasional dizziness. She is on metoprolol for her blood pressure, and noted earlier today that her pulse had slowed to 62 and she became concerned. She is scheduled for lab work for thyroid assessment tomorrow in preparation for her appointment next week. She has noticed an increase in urination over the last several days, and reports prior episodes of UTI. No history of kidney stones, and she denies flank pain.  The history is provided by the patient. No language interpreter was used.  Weakness  Primary symptoms include dizziness. This is a new problem. The current episode started more than 1 week ago. There has been no fever. Pertinent negatives include no shortness of breath, no chest pain, no vomiting and no headaches.    Past Medical History:  Diagnosis Date  . Anxiety   . Depression   . Diverticulosis   . GERD (gastroesophageal reflux disease)   . Internal hemorrhoids   . Thyroid disease   . Uterine fibroid     Patient Active Problem List   Diagnosis Date Noted  . GERD (gastroesophageal reflux disease) 01/28/2017  . Rectal bleeding 01/28/2017  . 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 Hypothyroidism 04/14/2016  . Gastric polyp   . RUQ abdominal pain 09/06/2014  . Excessive or frequent menstruation 01/13/2013  . Depression     Past Surgical History:  Procedure Laterality Date  . ABDOMINAL HYSTERECTOMY     Per patient, uterine fibroids.  . ABDOMINAL HYSTERECTOMY    . CHOLECYSTECTOMY N/A 11/06/2014   Procedure:  LAPAROSCOPIC CHOLECYSTECTOMY;  Surgeon: Jamesetta So, MD;  Location: [facility];  Service: General;  Laterality: N/A;  . COLONOSCOPY N/A 03/04/2017   Procedure: COLONOSCOPY;  Surgeon: Daneil Dolin, MD;  Location: [facility];  Service: Endoscopy;  Laterality: N/A;  8:30am  . DILATION AND CURETTAGE OF UTERUS    . ESOPHAGOGASTRODUODENOSCOPY N/A 10/02/2014   Procedure: ESOPHAGOGASTRODUODENOSCOPY (EGD);  Surgeon: Daneil Dolin, MD;  Location: [facility];  Service: Endoscopy;  Laterality: N/A;  945am - moved to 8:45 - Ginger notified pt  . MOUTH SURGERY     extraction of teeth  . POLYPECTOMY  03/04/2017   Procedure: POLYPECTOMY;  Surgeon: Daneil Dolin, MD;  Location: [facility];  Service: Endoscopy;;  colon  . TUBAL LIGATION       OB History    Gravida  2   Para  2   Term  2   Preterm      AB      Living  2     SAB      TAB      Ectopic      Multiple      Live Births               Home Medications    Prior to Admission medications   Medication Sig Start Date End Date Taking? Authorizing Provider  cholecalciferol (VITAMIN D) 1000 units tablet Take 1,000 Units by mouth daily.   Yes [provider]  diphenhydrAMINE (BENADRYL) 25 mg capsule Take 50 mg by mouth at bedtime as needed for allergies or sleep.   Yes [provider]  escitalopram (LEXAPRO) 20 MG tablet Take 20 mg by mouth at bedtime.    Yes [provider]  metoprolol tartrate (LOPRESSOR) 25 MG tablet Take 1 tablet (25 mg total) by mouth 2 (two) times daily. 01/08/18  Yes Nida, Marella Chimes, MD  Multiple Vitamin (MULTIVITAMIN WITH MINERALS) TABS Take 1 tablet by mouth daily.   Yes [provider]  Omega-3 Fatty Acids (FISH OIL) 1200 MG CAPS Take 1 capsule by mouth at bedtime.    Yes [provider]  Omeprazole 20 MG TBEC Take 1 tablet (20 mg total) by mouth daily before breakfast. 01/28/17  Yes Mahala Menghini, PA-C  pseudoephedrine-acetaminophen (TYLENOL  SINUS) 30-500 MG TABS tablet Take 1 tablet by mouth every 4 (four) hours as needed.   Yes [provider]  simethicone (PHAZYME) 125 MG chewable tablet Chew 125 mg by mouth every 6 (six) hours as needed for flatulence.   Yes [provider]  Wheat Dextrin (BENEFIBER DRINK MIX PO) Take 1 Package by mouth 3 (three) times daily.   Yes [provider]    Family History Family History  Problem Relation Age of Onset  . Hypertension Father   . Diabetes Father   . Depression Other   . Uterine cancer Mother        ?cervical cancer  . Uterine cancer Sister        suicide  . Colon cancer Paternal Aunt 24    Social History Social History   Tobacco Use  . Smoking status: Former Smoker    Packs/day: 1.00    Years: 3.00    Pack years: 3.00    Types: Cigarettes    Last attempt to quit: 11/03/1997    Years since quitting: 20.2  . Smokeless tobacco: Never Used  Substance Use Topics  . Alcohol use: No    Alcohol/week: 0.0 oz  . Drug use: No     Allergies   Levofloxacin; Lisinopril; and Penicillins   Review of Systems Review of Systems  Constitutional: Positive for fatigue.  Eyes: Negative for visual disturbance.  Respiratory: Negative for shortness of breath.   Cardiovascular: Negative for chest pain.  Gastrointestinal: Positive for abdominal pain. Negative for vomiting.  Genitourinary: Positive for frequency. Negative for flank pain.  Neurological: Positive for dizziness and weakness. Negative for headaches.  All other systems reviewed and are negative.    Physical Exam Updated Vital Signs BP (!) 136/97   Pulse 97   Temp 98.2 F (36.8 C) (Oral)   Resp 16   Ht 5\' 5"  (1.651 m)   Wt 92.5 kg (204 lb)   LMP 10/09/2014   SpO2 96%   BMI 33.95 kg/m   Physical Exam  Constitutional: She is oriented to person, place, and time. She appears well-developed and well-nourished.  HENT:  Head: Normocephalic.  Eyes: Pupils are equal, round, and reactive to  light.  Neck: Neck supple.  Cardiovascular: Normal rate and regular rhythm.  Pulmonary/Chest: Effort normal and breath sounds normal.  Abdominal: Soft. She exhibits no distension.  Musculoskeletal: Normal range of motion. She exhibits no edema.  Neurological: She is alert and oriented to person, place, and time.  Skin: Skin is warm and dry.  Psychiatric: She has a normal mood and affect.  Nursing note and vitals reviewed.    ED Treatments / Results  Labs (all labs  ordered are listed, but only abnormal results are displayed) Labs Reviewed  BASIC METABOLIC PANEL - Abnormal; Notable for the following components:      Result Value   Chloride 100 (*)    Glucose, Bld 108 (*)    All other components within normal limits  CBC - Abnormal; Notable for the following components:   WBC 11.7 (*)    All other components within normal limits  URINALYSIS, ROUTINE W REFLEX MICROSCOPIC - Abnormal; Notable for the following components:   APPearance HAZY (*)    Hgb urine dipstick MODERATE (*)    Leukocytes, UA MODERATE (*)    Squamous Epithelial / LPF 0-5 (*)    All other components within normal limits    EKG EKG Interpretation  Date/Time:  Monday January 18 2018 17:50:21 EDT Ventricular Rate:  77 PR Interval:  164 QRS Duration: 70 QT Interval:  400 QTC Calculation: 452 R Axis:   48 Text Interpretation:  Normal sinus rhythm Normal ECG poor r wave progression anterior similar to prior 3/19 Confirmed by Aletta Edouard [phone]) on 01/18/2018 7:40:38 PM   Radiology No results found.  Procedures Procedures (including critical care time)  Medications Ordered in ED Medications - No data to display   Initial Impression / Assessment and Plan / ED Course  I have reviewed the triage vital signs and the nursing notes.  Pertinent labs & imaging results that were available during my care of the patient were reviewed by me and considered in my medical decision making (see chart for details).      Pt diagnosed with cystitis. Pt is afebrile, without tachycardia, hypotension, or other signs of serious infection.  Pt to be dc home with antibiotics and instructions to follow up with PCP if symptoms persist. Discussed return precautions. Patient is scheduled for thyroid function panel tomorrow and follow-up with endocrinology next week. Pt appears safe for discharge.  Final Clinical Impressions(s) / ED Diagnoses   Final diagnoses:  Cystitis    ED Discharge Orders        Ordered    sulfamethoxazole-trimethoprim (BACTRIM DS,SEPTRA DS) 800-160 MG tablet  2 times daily     01/19/18 0015       Etta Quill, NP 01/19/18 0101    Merryl Hacker, MD 01/19/18 [phone]

## 2018-01-18 NOTE — ED Triage Notes (Signed)
Pt c/o generalized weakness, lethargy, dizzy, nausea x 1 week. Pt reports she was taken off her thyroid medication 5 weeks ago.

## 2018-01-19 ENCOUNTER — Telehealth: Payer: Self-pay

## 2018-01-19 MED ORDER — SULFAMETHOXAZOLE-TRIMETHOPRIM 800-160 MG PO TABS: 1.0000 | ORAL_TABLET | Freq: Two times a day (BID) | ORAL | 0 refills | Status: AC

## 2018-01-19 NOTE — Discharge Instructions (Signed)
Your urine has been sent for culture. You will be contacted if a change in treatment is indicated. Have your thyroid function panel collected tomorrow as scheduled.

## 2018-01-20 DIAGNOSIS — E039 Hypothyroidism, unspecified: Secondary | ICD-10-CM | POA: Diagnosis not present

## 2018-01-20 LAB — URINE CULTURE

## 2018-01-20 LAB — T3, FREE: T3, Free: 4.2 pg/mL (ref 2.3–4.2)

## 2018-01-20 LAB — TSH: TSH: 0.01 mIU/L — ABNORMAL LOW

## 2018-01-20 LAB — T4, FREE: Free T4: 1.2 ng/dL (ref 0.8–1.8)

## 2018-01-21 LAB — HEMOGLOBIN A1C
Hgb A1c MFr Bld: 5.4 % of total Hgb (ref <5.7)
Mean Plasma Glucose: 108 (calc)
eAG (mmol/L): 6 (calc)

## 2018-01-21 LAB — VITAMIN D 25 HYDROXY (VIT D DEFICIENCY, FRACTURES): VIT D 25 HYDROXY: 29 ng/mL — AB (ref 30–100)

## 2018-01-21 NOTE — Telephone Encounter (Signed)
error 

## 2018-01-26 ENCOUNTER — Other Ambulatory Visit: Payer: Self-pay

## 2018-01-26 ENCOUNTER — Ambulatory Visit (INDEPENDENT_AMBULATORY_CARE_PROVIDER_SITE_OTHER): Payer: BLUE CROSS/BLUE SHIELD | Admitting: "Endocrinology

## 2018-01-26 ENCOUNTER — Encounter: Payer: Self-pay | Admitting: "Endocrinology

## 2018-01-26 VITALS — BP 137/87 | HR 73 | Ht 64.0 in | Wt 225.0 lb

## 2018-01-26 DIAGNOSIS — E0590 Thyrotoxicosis, unspecified without thyrotoxic crisis or storm: Secondary | ICD-10-CM

## 2018-01-26 MED ORDER — METOPROLOL TARTRATE 25 MG PO TABS: 25.0000 mg | ORAL_TABLET | Freq: Two times a day (BID) | ORAL | 0 refills | Status: DC

## 2018-01-26 NOTE — Progress Notes (Signed)
Subjective:    Patient ID: Heather Fry, female    DOB: [birth year], PCP Redmond School, MD   Past Medical History:  Diagnosis Date  . Anxiety   . Depression   . Diverticulosis   . GERD (gastroesophageal reflux disease)   . Internal hemorrhoids   . Thyroid disease   . Uterine fibroid    Past Surgical History:  Procedure Laterality Date  . ABDOMINAL HYSTERECTOMY     Per patient, uterine fibroids.  . ABDOMINAL HYSTERECTOMY    . CHOLECYSTECTOMY N/A 11/06/2014   Procedure: LAPAROSCOPIC CHOLECYSTECTOMY;  Surgeon: Jamesetta So, MD;  Location: [facility];  Service: General;  Laterality: N/A;  . COLONOSCOPY N/A 03/04/2017   Procedure: COLONOSCOPY;  Surgeon: Daneil Dolin, MD;  Location: [facility];  Service: Endoscopy;  Laterality: N/A;  8:30am  . DILATION AND CURETTAGE OF UTERUS    . ESOPHAGOGASTRODUODENOSCOPY N/A 10/02/2014   Procedure: ESOPHAGOGASTRODUODENOSCOPY (EGD);  Surgeon: Daneil Dolin, MD;  Location: [facility];  Service: Endoscopy;  Laterality: N/A;  945am - moved to 8:45 - Ginger notified pt  . MOUTH SURGERY     extraction of teeth  . POLYPECTOMY  03/04/2017   Procedure: POLYPECTOMY;  Surgeon: Daneil Dolin, MD;  Location: [facility];  Service: Endoscopy;;  colon  . TUBAL LIGATION     Social History   Socioeconomic History  . Marital status: Married    Spouse name: Not on file  . Number of children: Not on file  . Years of education: Not on file  . Highest education level: Not on file  Occupational History  . Not on file  Social Needs  . Financial resource strain: Not on file  . Food insecurity:    Worry: Not on file    Inability: Not on file  . Transportation needs:    Medical: Not on file    Non-medical: Not on file  Tobacco Use  . Smoking status: Former Smoker    Packs/day: 1.00    Years: 3.00    Pack years: 3.00    Types: Cigarettes    Last attempt to quit: 11/03/1997    Years since quitting: 20.2  . Smokeless tobacco: Never Used   Substance and Sexual Activity  . Alcohol use: No    Alcohol/week: 0.0 oz  . Drug use: No  . Sexual activity: Yes    Birth control/protection: Surgical  Lifestyle  . Physical activity:    Days per week: Not on file    Minutes per session: Not on file  . Stress: Not on file  Relationships  . Social connections:    Talks on phone: Not on file    Gets together: Not on file    Attends religious service: Not on file    Active member of club or organization: Not on file    Attends meetings of clubs or organizations: Not on file    Relationship status: Not on file  Other Topics Concern  . Not on file  Social History Narrative  . Not on file   Outpatient Encounter Medications as of 01/26/2018  Medication Sig  . cholecalciferol (VITAMIN D) 1000 units tablet Take 1,000 Units by mouth daily.  . diphenhydrAMINE (BENADRYL) 25 mg capsule Take 50 mg by mouth at bedtime as needed for allergies or sleep.  Marland Kitchen escitalopram (LEXAPRO) 20 MG tablet Take 20 mg by mouth at bedtime.   . Multiple Vitamin (MULTIVITAMIN WITH MINERALS) TABS Take 1 tablet by  mouth daily.  . Omega-3 Fatty Acids (FISH OIL) 1200 MG CAPS Take 1 capsule by mouth at bedtime.   . Omeprazole 20 MG TBEC Take 1 tablet (20 mg total) by mouth daily before breakfast.  . pseudoephedrine-acetaminophen (TYLENOL SINUS) 30-500 MG TABS tablet Take 1 tablet by mouth every 4 (four) hours as needed.  . simethicone (PHAZYME) 125 MG chewable tablet Chew 125 mg by mouth every 6 (six) hours as needed for flatulence.  . sulfamethoxazole-trimethoprim (BACTRIM DS,SEPTRA DS) 800-160 MG tablet Take 1 tablet by mouth 2 (two) times daily for 7 days.  . Wheat Dextrin (BENEFIBER DRINK MIX PO) Take 1 Package by mouth 3 (three) times daily.  . [DISCONTINUED] metoprolol tartrate (LOPRESSOR) 25 MG tablet Take 1 tablet (25 mg total) by mouth 2 (two) times daily.   No facility-administered encounter medications on file as of 01/26/2018.    ALLERGIES: Allergies   Allergen Reactions  . Levofloxacin Other (See Comments)    Pt can not have due to taking Lexapro   . Lisinopril Swelling  . Penicillins Other (See Comments)    Loopy Has patient had a PCN reaction causing immediate rash, facial/tongue/throat swelling, SOB or lightheadedness with hypotension: No Has patient had a PCN reaction causing severe rash involving mucus membranes or skin necrosis: No Has patient had a PCN reaction that required hospitalization: No Has patient had a PCN reaction occurring within the last 10 years: No If all of the above answers are "NO", then may proceed with Cephalosporin use.  "knocked out.     VACCINATION STATUS:  There is no immunization history on file for this patient.  HPI 42 year old female patient with medical history as above. She is being seen in follow-up for abnormal thyroid function.   -She was taken off of her low-dose levothyroxine 25 mcg daily prior to her last visit . -She presents with symptoms including fatigue, nausea, and on and off palpitations.    -She has gained 5 pounds since last visit.  She is currently on metoprolol 25 mg p.o. daily.    She is status post partial hysterectomy for symptomatic fibroids.  - She denies any family history of thyroid dysfunction. She denies any personal history of goiter. She is not on any thyroid hormone supplement.  Review of Systems Constitutional:  + Progressive weight gain , + fatigue , no subjective hyperthermia nor hypothymia.   Eyes: no blurry vision, no xerophthalmia ENT: no sore throat, no nodules palpated in throat, no dysphagia/odynophagia, no hoarseness Cardiovascular: Chest pain, no palpitations. Respiratory: no cough/SOB Gastrointestinal: + Nausea, no diarrhea, no abdominal pain.   Skin: no rashes Neurological: No tremors, no tingling.   Psychiatric: no depression/anxiety  Objective:    BP 137/87   Pulse 73   Ht 5\' 4"  (1.626 m)   Wt 225 lb (102.1 kg)   LMP 10/09/2014   BMI  38.62 kg/m   Wt Readings from Last 3 Encounters:  01/26/18 225 lb (102.1 kg)  01/18/18 204 lb (92.5 kg)  12/15/17 220 lb (99.8 kg)    Physical Exam  Constitutional: + Obese, not in acute distress, normal state of mind.    Eyes: PERRLA, EOMI, no exophthalmos ENT: moist mucous membranes, no thyromegaly, no cervical lymphadenopathy Musculoskeletal: no deformities, strength intact in all 4 Skin: moist, + tattoo, warm, no rashes Neurological: No tremors of outstretched hands    CMP     Component Value Date/Time   NA 136 01/18/2018 1803   K 3.7 01/18/2018 1803  CL 100 (L) 01/18/2018 1803   CO2 26 01/18/2018 1803   GLUCOSE 108 (H) 01/18/2018 1803   BUN 7 01/18/2018 1803   CREATININE 0.60 01/18/2018 1803   CALCIUM 9.7 01/18/2018 1803   PROT 7.8 06/07/2017 1006   ALBUMIN 4.0 06/07/2017 1006   AST 20 06/07/2017 1006   ALT 22 06/07/2017 1006   ALKPHOS 62 06/07/2017 1006   BILITOT 0.4 06/07/2017 1006   GFRNONAA >60 01/18/2018 1803   GFRAA >60 01/18/2018 1803   Results for ADELFA, LOZITO (MRN [id number]) as of 01/26/2018 11:18  Ref. Range 11/27/2017 19:05 01/20/2018 14:01  TSH Latest Units: mIU/L <0.010 (L) 0.01 (L)  Triiodothyronine,Free,Serum Latest Ref Range: 2.3 - 4.2 pg/mL  4.2  T4,Free(Direct) Latest Ref Range: 0.8 - 1.8 ng/dL  1.2     EKG was normal sinus  Assessment & Plan:   1.  Subclinical hyperthyroidism  -Her previsit labs are more consistent with subclinical hyperthyroidism, more than 6 weeks after her last exposure to levothyroxine. -Given her subtle nonspecific symptoms, I approach her for thyroid uptake and scan to characterize her thyroid activity better. -This test will be scheduled to be done as soon as possible and she will see me in 15 days for treatment decisions if necessary. -In the meantime, I advised her to increase metoprolol to 25 mg p.o. twice daily.  -She does not have clinical goiter hence no need for imaging studies.  - I advised patient to  maintain close follow up with Redmond School, MD for primary care needs.  Follow up plan: Return in about 2 weeks (around 02/09/2018) for follow up with thyroid uptake and scan.  Glade Lloyd, MD Phone: [phone]  Fax: [phone]   -  This note was partially dictated with voice recognition software. Similar sounding words can be transcribed inadequately or may not  be corrected upon review.  01/26/2018, 11:09 AM

## 2018-01-27 ENCOUNTER — Telehealth: Payer: Self-pay

## 2018-01-27 MED ORDER — VITAMIN D 1000 UNITS PO TABS: 1000.0000 [IU] | ORAL_TABLET | Freq: Every day | ORAL | 0 refills | Status: DC

## 2018-01-27 NOTE — Telephone Encounter (Signed)
Vitamin D prescription sent to [facility]

## 2018-02-02 ENCOUNTER — Encounter (HOSPITAL_COMMUNITY): Payer: Self-pay

## 2018-02-02 ENCOUNTER — Encounter (HOSPITAL_COMMUNITY)
Admission: RE | Admit: 2018-02-02 | Discharge: 2018-02-02 | Disposition: A | Discharge status: Home / Self Care | Payer: BLUE CROSS/BLUE SHIELD | Source: Ambulatory Visit | Attending: "Endocrinology | Admitting: "Endocrinology

## 2018-02-02 DIAGNOSIS — E0590 Thyrotoxicosis, unspecified without thyrotoxic crisis or storm: Secondary | ICD-10-CM | POA: Diagnosis not present

## 2018-02-02 IMAGING — NM NM THYROID IMAGING W/ UPTAKE MULTI (4&24 HR)
4 series · 4 of 4 positions shown · non-contrast
Comparison: [DATE]

CLINICAL DATA: Subclinical hyperthyroidism, history thyroiditis

EXAM:
THYROID SCAN AND UPTAKE - 4 AND 24 HOURS
TECHNIQUE: Following oral administration of [Y9] capsule, anterior planar
imaging was acquired at 24 hours. Thyroid uptake was calculated with
a thyroid probe at 4-6 hours and 24 hours.
RADIOPHARMACEUTICALS:  313.4 uCi [Y9] sodium iodide p.o.

[Series 1: ant w marker · 1.18mm/px · 1 of 1 slices shown]
[im 1/1]
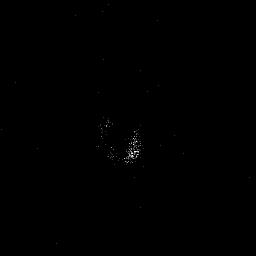

[Series 2: anterior · 1.18mm/px · 1 of 1 slices shown]
[im 1/1]
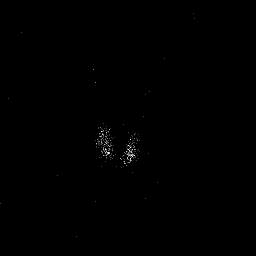

[Series 3: lao · 1.18mm/px · 1 of 1 slices shown]
[im 1/1]
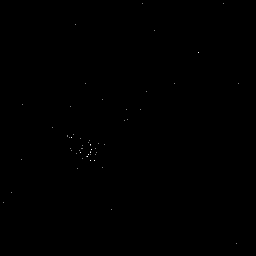

[Series 4: rao · 1.18mm/px · 1 of 1 slices shown]
[im 1/1]
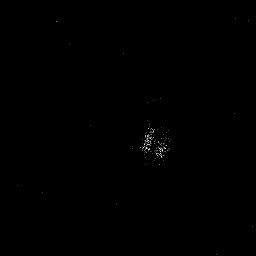

[4 of 4 positions shown; findings below may reference images not displayed]

FINDINGS: Homogeneous tracer distribution in both thyroid lobes.

No focal areas of increased or decreased tracer localization seen..

4 hour [Y9] uptake = 3% (normal 5-20%)

24 hour [Y9] uptake = 13% (normal 10-30%)
IMPRESSION: Normal exam as above.

## 2018-02-02 MED ORDER — SODIUM IODIDE I-123 7.4 MBQ CAPS
320.0000 | ORAL_CAPSULE | Freq: Once | ORAL | Status: AC
  Administered 2018-02-02: 313.4 via ORAL

## 2018-02-03 ENCOUNTER — Encounter (HOSPITAL_COMMUNITY): Payer: Self-pay

## 2018-02-03 ENCOUNTER — Encounter (HOSPITAL_COMMUNITY)
Admission: RE | Admit: 2018-02-03 | Discharge: 2018-02-03 | Disposition: A | Discharge status: Home / Self Care | Payer: BLUE CROSS/BLUE SHIELD | Source: Ambulatory Visit | Attending: "Endocrinology | Admitting: "Endocrinology

## 2018-02-03 DIAGNOSIS — E0590 Thyrotoxicosis, unspecified without thyrotoxic crisis or storm: Secondary | ICD-10-CM | POA: Diagnosis not present

## 2018-02-04 ENCOUNTER — Other Ambulatory Visit: Payer: Self-pay

## 2018-02-04 ENCOUNTER — Emergency Department (HOSPITAL_COMMUNITY)
Admission: EM | Admit: 2018-02-04 | Discharge: 2018-02-04 | Disposition: A | Discharge status: Home / Self Care | Payer: BLUE CROSS/BLUE SHIELD | Attending: Emergency Medicine | Admitting: Emergency Medicine

## 2018-02-04 ENCOUNTER — Emergency Department (HOSPITAL_COMMUNITY): Payer: BLUE CROSS/BLUE SHIELD

## 2018-02-04 ENCOUNTER — Encounter (HOSPITAL_COMMUNITY): Payer: Self-pay | Admitting: *Deleted

## 2018-02-04 DIAGNOSIS — Z87891 Personal history of nicotine dependence: Secondary | ICD-10-CM | POA: Diagnosis not present

## 2018-02-04 DIAGNOSIS — R638 Other symptoms and signs concerning food and fluid intake: Secondary | ICD-10-CM | POA: Diagnosis not present

## 2018-02-04 DIAGNOSIS — R55 Syncope and collapse: Secondary | ICD-10-CM | POA: Insufficient documentation

## 2018-02-04 DIAGNOSIS — R42 Dizziness and giddiness: Secondary | ICD-10-CM | POA: Diagnosis not present

## 2018-02-04 DIAGNOSIS — E039 Hypothyroidism, unspecified: Secondary | ICD-10-CM | POA: Insufficient documentation

## 2018-02-04 DIAGNOSIS — R079 Chest pain, unspecified: Secondary | ICD-10-CM | POA: Diagnosis not present

## 2018-02-04 DIAGNOSIS — Z79899 Other long term (current) drug therapy: Secondary | ICD-10-CM | POA: Insufficient documentation

## 2018-02-04 DIAGNOSIS — R531 Weakness: Secondary | ICD-10-CM | POA: Diagnosis not present

## 2018-02-04 DIAGNOSIS — R0789 Other chest pain: Secondary | ICD-10-CM | POA: Diagnosis not present

## 2018-02-04 DIAGNOSIS — R404 Transient alteration of awareness: Secondary | ICD-10-CM | POA: Diagnosis not present

## 2018-02-04 LAB — URINALYSIS, ROUTINE W REFLEX MICROSCOPIC
Bilirubin Urine: NEGATIVE
Glucose, UA: NEGATIVE mg/dL
Hgb urine dipstick: NEGATIVE
KETONES UR: NEGATIVE mg/dL
Leukocytes, UA: NEGATIVE
NITRITE: NEGATIVE
Protein, ur: NEGATIVE mg/dL
SPECIFIC GRAVITY, URINE: 1.013 (ref 1.005–1.030)
pH: 7 (ref 5.0–8.0)

## 2018-02-04 LAB — BASIC METABOLIC PANEL
Anion gap: 7 (ref 5–15)
BUN: 10 mg/dL (ref 6–20)
CALCIUM: 9.4 mg/dL (ref 8.9–10.3)
CO2: 25 mmol/L (ref 22–32)
CREATININE: 0.6 mg/dL (ref 0.44–1.00)
Chloride: 103 mmol/L (ref 101–111)
GFR calc Af Amer: >60 mL/min (ref >60)
GFR calc non Af Amer: >60 mL/min (ref >60)
Glucose, Bld: 110 mg/dL — ABNORMAL HIGH (ref 65–99)
Potassium: 3.5 mmol/L (ref 3.5–5.1)
SODIUM: 135 mmol/L (ref 135–145)

## 2018-02-04 LAB — CBC
HCT: 41.1 % (ref 36.0–46.0)
HEMOGLOBIN: 13.8 g/dL (ref 12.0–15.0)
MCH: 29.6 pg (ref 26.0–34.0)
MCHC: 33.6 g/dL (ref 30.0–36.0)
MCV: 88 fL (ref 78.0–100.0)
Platelets: 255 10*3/uL (ref 150–400)
RBC: 4.67 MIL/uL (ref 3.87–5.11)
RDW: 12.8 % (ref 11.5–15.5)
WBC: 8.5 10*3/uL (ref 4.0–10.5)

## 2018-02-04 LAB — TROPONIN I: Troponin I: <0.03 ng/mL (ref <0.03)

## 2018-02-04 LAB — CBG MONITORING, ED: GLUCOSE-CAPILLARY: 111 mg/dL — AB (ref 65–99)

## 2018-02-04 IMAGING — DX DG CHEST 2V
2 series · 2 of 2 positions shown · non-contrast
Comparison: [DATE] chest radiograph.

CLINICAL DATA: 41 y/o  F; chest pain, lightheadedness, diaphoresis.

EXAM:
CHEST - 2 VIEW

[chest pa]
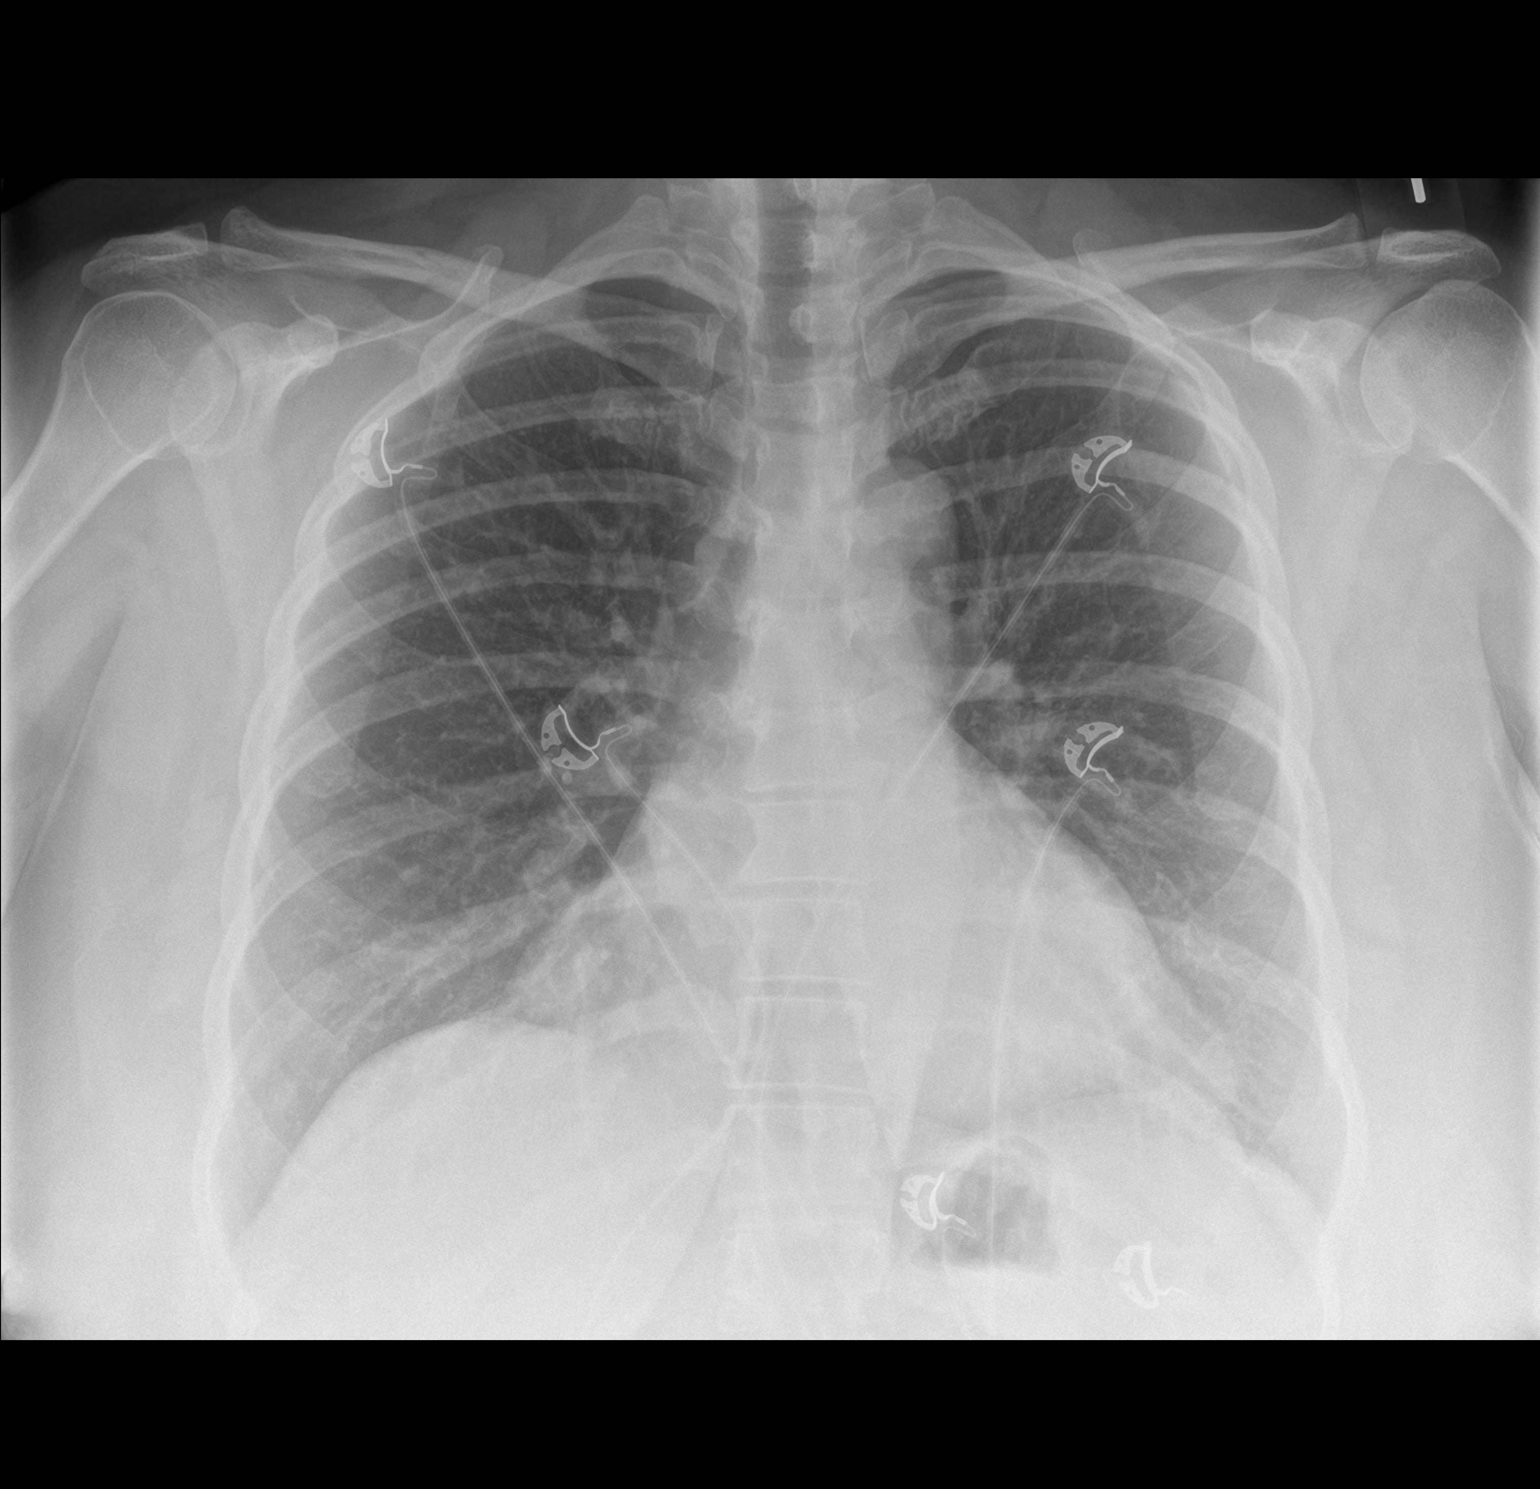

[chest lat]
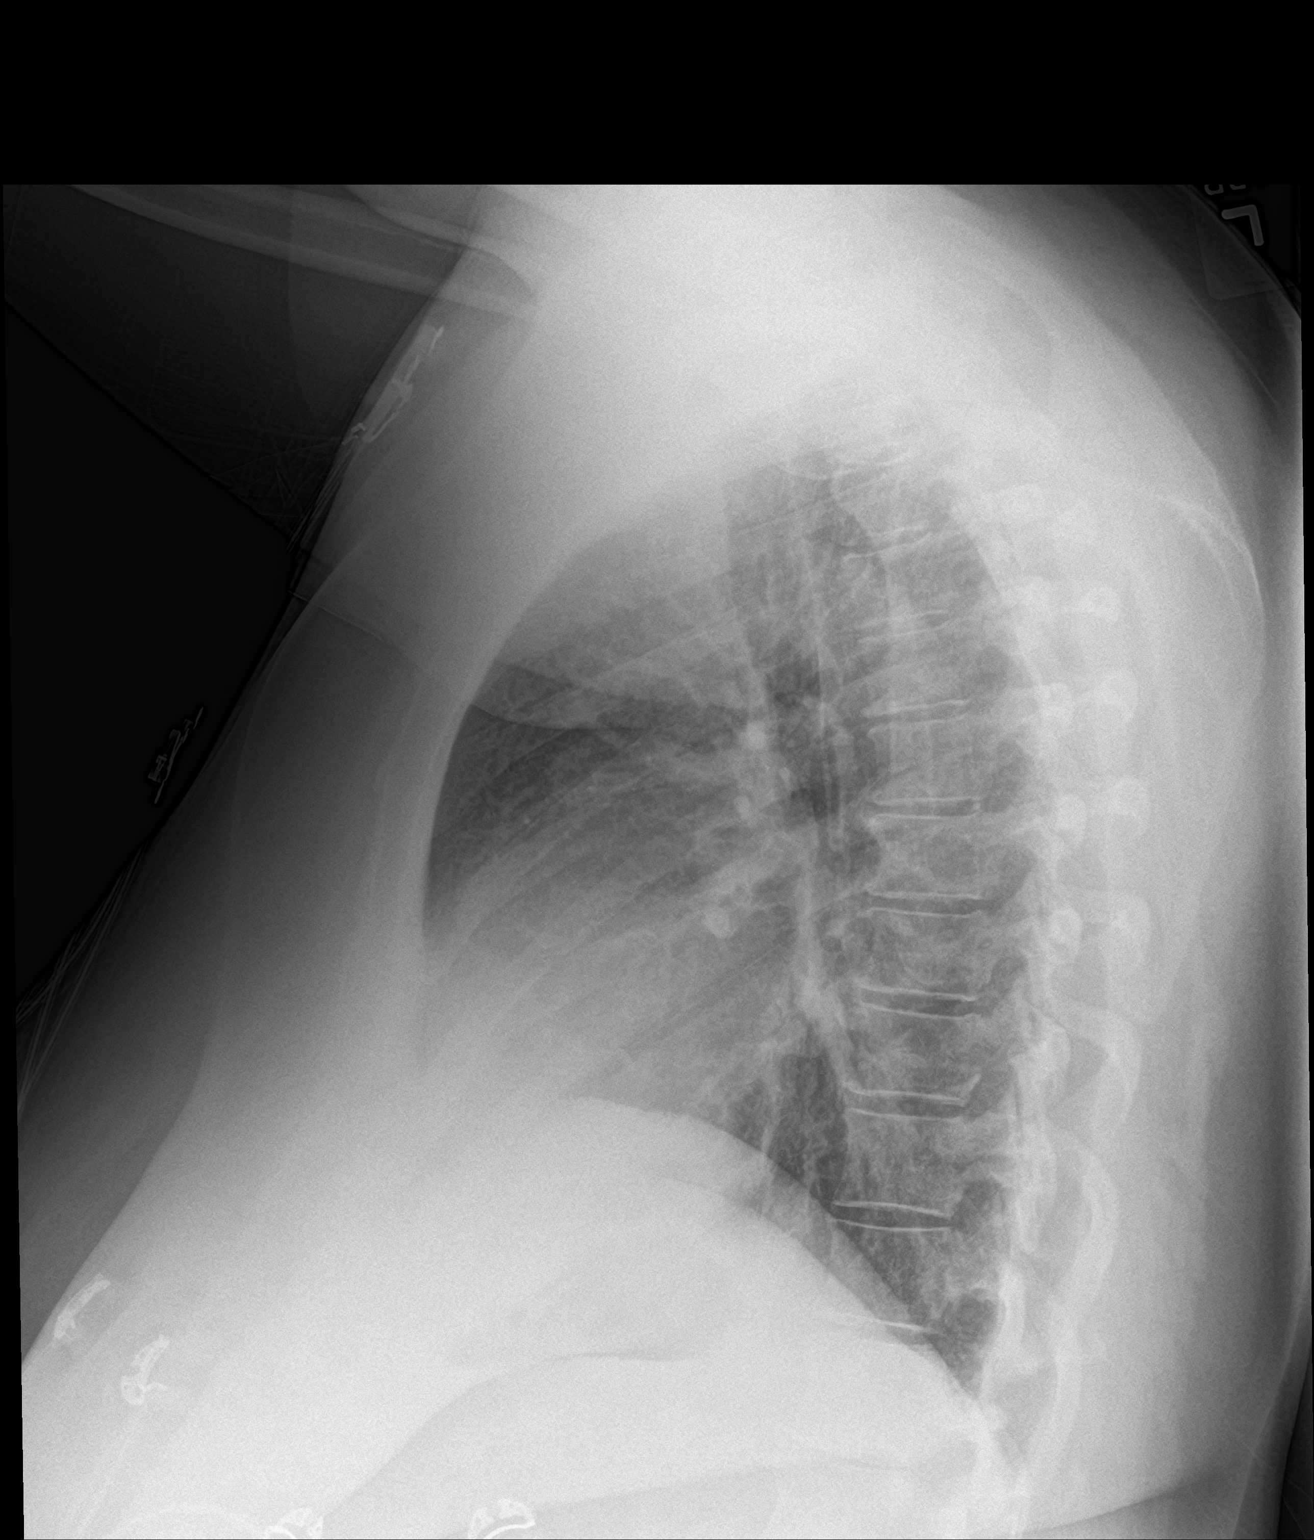

[2 of 2 positions shown; findings below may reference images not displayed]

FINDINGS: Stable heart size and mediastinal contours are within normal limits.
Both lungs are clear. The visualized skeletal structures are
unremarkable. Right upper quadrant cholecystectomy clips.
IMPRESSION: No acute pulmonary process identified.

By: DENONA M.D.

## 2018-02-04 NOTE — ED Notes (Signed)
Pt returned from xray

## 2018-02-04 NOTE — ED Provider Notes (Signed)
[facility] EMERGENCY DEPARTMENT Provider Note   CSN: [id number] Arrival date & time: 02/04/18  1810     History   Chief Complaint Chief Complaint  Patient presents with  . Chest Pain    HPI Heather Fry is a 42 y.o. female.  HPI Patient presents after feeling lightheaded and being pale and sweaty.  Had slight chest pain during the episode.  Began earlier today.  States it started about an hour after taking her metoprolol.  States she has been treated for her thyroid by Dr. Dorris Fetch with some recent changes in her medications. had recent thyroid uptake scan.  Not had fevers.  States her metoprolol always gives her problems.  States her blood pressure was low when this episode happened.  Reportedly was in the 09T systolic.  Feeling somewhat better now.  States she has been very sleepy.  However she has lost weight. Past Medical History:  Diagnosis Date  . Anxiety   . Depression   . Diverticulosis   . GERD (gastroesophageal reflux disease)   . Internal hemorrhoids   . Thyroid disease   . Uterine fibroid     Patient Active Problem List   Diagnosis Date Noted  . GERD (gastroesophageal reflux disease) 01/28/2017  . Rectal bleeding 01/28/2017  . 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 Hypothyroidism 04/14/2016  . Gastric polyp   . RUQ abdominal pain 09/06/2014  . Excessive or frequent menstruation 01/13/2013  . Depression     Past Surgical History:  Procedure Laterality Date  . ABDOMINAL HYSTERECTOMY     Per patient, uterine fibroids.  . ABDOMINAL HYSTERECTOMY    . CHOLECYSTECTOMY N/A 11/06/2014   Procedure: LAPAROSCOPIC CHOLECYSTECTOMY;  Surgeon: Jamesetta So, MD;  Location: [facility];  Service: General;  Laterality: N/A;  . COLONOSCOPY N/A 03/04/2017   Procedure: COLONOSCOPY;  Surgeon: Daneil Dolin, MD;  Location: [facility];  Service: Endoscopy;  Laterality: N/A;  8:30am  . DILATION AND CURETTAGE OF UTERUS    . ESOPHAGOGASTRODUODENOSCOPY N/A 10/02/2014   Procedure:  ESOPHAGOGASTRODUODENOSCOPY (EGD);  Surgeon: Daneil Dolin, MD;  Location: [facility];  Service: Endoscopy;  Laterality: N/A;  945am - moved to 8:45 - Ginger notified pt  . MOUTH SURGERY     extraction of teeth  . POLYPECTOMY  03/04/2017   Procedure: POLYPECTOMY;  Surgeon: Daneil Dolin, MD;  Location: [facility];  Service: Endoscopy;;  colon  . TUBAL LIGATION       OB History    Gravida  2   Para  2   Term  2   Preterm      AB      Living  2     SAB      TAB      Ectopic      Multiple      Live Births               Home Medications    Prior to Admission medications   Medication Sig Start Date End Date Taking? Authorizing Provider  cholecalciferol (VITAMIN D) 1000 units tablet Take 1 tablet (1,000 Units total) by mouth daily. 01/27/18  Yes Nida, Marella Chimes, MD  escitalopram (LEXAPRO) 20 MG tablet Take 20 mg by mouth at bedtime.    Yes [provider]  metoprolol tartrate (LOPRESSOR) 25 MG tablet Take 1 tablet (25 mg total) by mouth 2 (two) times daily. 01/26/18  Yes Nida, Marella Chimes, MD  Multiple Vitamin (MULTIVITAMIN WITH MINERALS) TABS  Take 1 tablet by mouth daily.   Yes [provider]  Omega-3 Fatty Acids (FISH OIL) 1200 MG CAPS Take 1 capsule by mouth at bedtime.    Yes [provider]  Omeprazole 20 MG TBEC Take 1 tablet (20 mg total) by mouth daily before breakfast. 01/28/17  Yes Mahala Menghini, PA-C  simethicone (PHAZYME) 125 MG chewable tablet Chew 125 mg by mouth every 6 (six) hours as needed for flatulence.   Yes [provider]  Wheat Dextrin (BENEFIBER DRINK MIX PO) Take 1 Package by mouth 3 (three) times daily.   Yes [provider]  diphenhydrAMINE (BENADRYL) 25 mg capsule Take 50 mg by mouth at bedtime as needed for allergies or sleep.    [provider]  pseudoephedrine-acetaminophen (TYLENOL SINUS) 30-500 MG TABS tablet Take 1 tablet by mouth every 4 (four) hours as needed.     [provider]    Family History Family History  Problem Relation Age of Onset  . Hypertension Father   . Diabetes Father   . Depression Other   . Uterine cancer Mother        ?cervical cancer  . Uterine cancer Sister        suicide  . Colon cancer Paternal Aunt 85    Social History Social History   Tobacco Use  . Smoking status: Former Smoker    Packs/day: 1.00    Years: 3.00    Pack years: 3.00    Types: Cigarettes    Last attempt to quit: 11/03/1997    Years since quitting: 20.2  . Smokeless tobacco: Never Used  Substance Use Topics  . Alcohol use: No    Alcohol/week: 0.0 oz  . Drug use: No     Allergies   Levofloxacin; Lisinopril; and Penicillins   Review of Systems Review of Systems  Constitutional: Positive for appetite change.  HENT: Negative for congestion.   Respiratory: Negative for shortness of breath.   Cardiovascular: Positive for chest pain.  Gastrointestinal: Negative for abdominal pain.  Genitourinary: Negative for flank pain.  Musculoskeletal: Negative for back pain.  Skin: Negative for rash.  Neurological: Positive for light-headedness.  Psychiatric/Behavioral: Negative for confusion.     Physical Exam Updated Vital Signs BP 117/78   Pulse 81   Temp 98.5 F (36.9 C) (Oral)   Resp 13   Ht 5\' 4"  (1.626 m)   Wt 106.6 kg (235 lb)   LMP 10/09/2014   SpO2 100%   BMI 40.34 kg/m   Physical Exam  Constitutional: She appears well-developed and well-nourished.  HENT:  Head: Atraumatic.  Eyes: Pupils are equal, round, and reactive to light.  Neck: Neck supple. No thyromegaly present.  Cardiovascular: Regular rhythm.  Pulmonary/Chest: Effort normal and breath sounds normal.  Abdominal: Soft. There is no tenderness.  Musculoskeletal:       Right lower leg: She exhibits no edema.       Left lower leg: She exhibits no edema.  Neurological: She is alert.  Skin: Skin is warm. Capillary refill takes less than 2 seconds.    Psychiatric: She has a normal mood and affect.     ED Treatments / Results  Labs (all labs ordered are listed, but only abnormal results are displayed) Labs Reviewed  BASIC METABOLIC PANEL - Abnormal; Notable for the following components:      Result Value   Glucose, Bld 110 (*)    All other components within normal limits  CBG MONITORING, ED - Abnormal;  Notable for the following components:   Glucose-Capillary 111 (*)    All other components within normal limits  CBC  TROPONIN I  URINALYSIS, ROUTINE W REFLEX MICROSCOPIC    EKG None  Radiology Dg Chest 2 View  Result Date: 02/04/2018 CLINICAL DATA:  42 y/o  F; chest pain, lightheadedness, diaphoresis. EXAM: CHEST - 2 VIEW COMPARISON:  05/22/2015 chest radiograph. FINDINGS: Stable heart size and mediastinal contours are within normal limits. Both lungs are clear. The visualized skeletal structures are unremarkable. Right upper quadrant cholecystectomy clips. IMPRESSION: No acute pulmonary process identified. Electronically Signed   By: Kristine Garbe M.D.   On: 02/04/2018 19:04   Nm Thyroid Mult Uptake W/imaging  Result Date: 02/03/2018 CLINICAL DATA:  Subclinical hyperthyroidism, history thyroiditis EXAM: THYROID SCAN AND UPTAKE - 4 AND 24 HOURS TECHNIQUE: Following oral administration of I-123 capsule, anterior planar imaging was acquired at 24 hours. Thyroid uptake was calculated with a thyroid probe at 4-6 hours and 24 hours. RADIOPHARMACEUTICALS:  313.4 uCi I-123 sodium iodide p.o. COMPARISON:  05/07/2016 FINDINGS: Homogeneous tracer distribution in both thyroid lobes. No focal areas of increased or decreased tracer localization seen. 4 hour I-123 uptake = 3% (normal 5-20%) 24 hour I-123 uptake = 13% (normal 10-30%) IMPRESSION: Normal exam as above. Electronically Signed   By: Lavonia Dana M.D.   On: 02/03/2018 10:37    Procedures Procedures (including critical care time)  Medications Ordered in ED Medications - No  data to display   Initial Impression / Assessment and Plan / ED Course  I have reviewed the triage vital signs and the nursing notes.  Pertinent labs & imaging results that were available during my care of the patient were reviewed by me and considered in my medical decision making (see chart for details).     Patient with episode of feeling bad.  Had some hypotensive blood pressures at the time.  Blood pressure improved.  Good heart rate.  Still feels bad.  Has had some recent endocrine adjustments and is on metoprolol.  Lab work reassuring.  EKG reassuring.  Discharge home and follow-up as needed.  Final Clinical Impressions(s) / ED Diagnoses   Final diagnoses:  Near syncope    ED Discharge Orders    None       Davonna Belling, MD 02/04/18 2130

## 2018-02-04 NOTE — ED Notes (Signed)
Pt recent dx with UTI and has finished antibiotic treatment last week.

## 2018-02-04 NOTE — ED Triage Notes (Signed)
Pt with lightheadedness and diaphoresis earlier today while after taking medication.  118/78 with [facility] but pt had checked her bp with machine at home and was low.  cbg 97 per [facility].

## 2018-02-05 ENCOUNTER — Telehealth: Payer: Self-pay | Admitting: "Endocrinology

## 2018-02-05 ENCOUNTER — Telehealth: Payer: Self-pay

## 2018-02-05 NOTE — Telephone Encounter (Signed)
Pt notified and has appt Mon 02-08-18

## 2018-02-05 NOTE — Telephone Encounter (Signed)
error 

## 2018-02-05 NOTE — Telephone Encounter (Signed)
Her thyroid is not the cause of the symptoms she had yesterday. The scan is normal. She will still benefit from low dose Lopressor 25 mg po day. We will discuss her long term care on her next visit.

## 2018-02-05 NOTE — Telephone Encounter (Signed)
Heather Fry is calling stating that she needs to be seen today, her Thyroid issue is "out of control" her metoprolol tartrate (LOPRESSOR) 25 MG tablet is causing her blood pressure and her blood sugars is dropping to low. Was taken yesterday by EMS to ER Please advise?

## 2018-02-06 ENCOUNTER — Emergency Department (HOSPITAL_COMMUNITY): Payer: BLUE CROSS/BLUE SHIELD

## 2018-02-06 ENCOUNTER — Other Ambulatory Visit: Payer: Self-pay

## 2018-02-06 ENCOUNTER — Encounter (HOSPITAL_COMMUNITY): Payer: Self-pay | Admitting: Emergency Medicine

## 2018-02-06 ENCOUNTER — Emergency Department (HOSPITAL_COMMUNITY)
Admission: EM | Admit: 2018-02-06 | Discharge: 2018-02-06 | Disposition: A | Discharge status: Home / Self Care | Payer: BLUE CROSS/BLUE SHIELD | Attending: Emergency Medicine | Admitting: Emergency Medicine

## 2018-02-06 DIAGNOSIS — Z79899 Other long term (current) drug therapy: Secondary | ICD-10-CM | POA: Insufficient documentation

## 2018-02-06 DIAGNOSIS — Z87891 Personal history of nicotine dependence: Secondary | ICD-10-CM | POA: Insufficient documentation

## 2018-02-06 DIAGNOSIS — R1013 Epigastric pain: Secondary | ICD-10-CM | POA: Insufficient documentation

## 2018-02-06 DIAGNOSIS — K5730 Diverticulosis of large intestine without perforation or abscess without bleeding: Secondary | ICD-10-CM | POA: Diagnosis not present

## 2018-02-06 DIAGNOSIS — R109 Unspecified abdominal pain: Secondary | ICD-10-CM | POA: Diagnosis not present

## 2018-02-06 DIAGNOSIS — E039 Hypothyroidism, unspecified: Secondary | ICD-10-CM | POA: Diagnosis not present

## 2018-02-06 DIAGNOSIS — R197 Diarrhea, unspecified: Secondary | ICD-10-CM | POA: Diagnosis not present

## 2018-02-06 DIAGNOSIS — R110 Nausea: Secondary | ICD-10-CM | POA: Diagnosis not present

## 2018-02-06 LAB — COMPREHENSIVE METABOLIC PANEL
ALT: 25 U/L (ref 14–54)
AST: 22 U/L (ref 15–41)
Albumin: 4 g/dL (ref 3.5–5.0)
Alkaline Phosphatase: 56 U/L (ref 38–126)
Anion gap: 10 (ref 5–15)
BUN: 9 mg/dL (ref 6–20)
CHLORIDE: 104 mmol/L (ref 101–111)
CO2: 25 mmol/L (ref 22–32)
Calcium: 9.6 mg/dL (ref 8.9–10.3)
Creatinine, Ser: 0.61 mg/dL (ref 0.44–1.00)
GFR calc Af Amer: >60 mL/min (ref >60)
GFR calc non Af Amer: >60 mL/min (ref >60)
GLUCOSE: 117 mg/dL — AB (ref 65–99)
POTASSIUM: 3.6 mmol/L (ref 3.5–5.1)
Sodium: 139 mmol/L (ref 135–145)
Total Bilirubin: 0.5 mg/dL (ref 0.3–1.2)
Total Protein: 8 g/dL (ref 6.5–8.1)

## 2018-02-06 LAB — CBG MONITORING, ED
GLUCOSE-CAPILLARY: 134 mg/dL — AB (ref 65–99)
GLUCOSE-CAPILLARY: 101 mg/dL — AB (ref 65–99)

## 2018-02-06 LAB — URINALYSIS, ROUTINE W REFLEX MICROSCOPIC
Bilirubin Urine: NEGATIVE
Glucose, UA: NEGATIVE mg/dL
Hgb urine dipstick: NEGATIVE
KETONES UR: NEGATIVE mg/dL
LEUKOCYTES UA: NEGATIVE
Nitrite: NEGATIVE
PH: 6 (ref 5.0–8.0)
Protein, ur: NEGATIVE mg/dL
Specific Gravity, Urine: 1.009 (ref 1.005–1.030)

## 2018-02-06 LAB — CBC
HEMATOCRIT: 42 % (ref 36.0–46.0)
HEMOGLOBIN: 14 g/dL (ref 12.0–15.0)
MCH: 29.5 pg (ref 26.0–34.0)
MCHC: 33.3 g/dL (ref 30.0–36.0)
MCV: 88.6 fL (ref 78.0–100.0)
Platelets: 283 10*3/uL (ref 150–400)
RBC: 4.74 MIL/uL (ref 3.87–5.11)
RDW: 12.9 % (ref 11.5–15.5)
WBC: 10.6 10*3/uL — AB (ref 4.0–10.5)

## 2018-02-06 LAB — I-STAT BETA HCG BLOOD, ED (MC, WL, AP ONLY): I-stat hCG, quantitative: <5 m[IU]/mL (ref <5)

## 2018-02-06 LAB — TROPONIN I

## 2018-02-06 LAB — LIPASE, BLOOD: LIPASE: 38 U/L (ref 11–51)

## 2018-02-06 IMAGING — DX DG CHEST 2V
2 series · 2 of 2 positions shown · non-contrast
Comparison: [DATE]

CLINICAL DATA: Abdominal pain and nausea

EXAM:
CHEST - 2 VIEW

[chest pa]
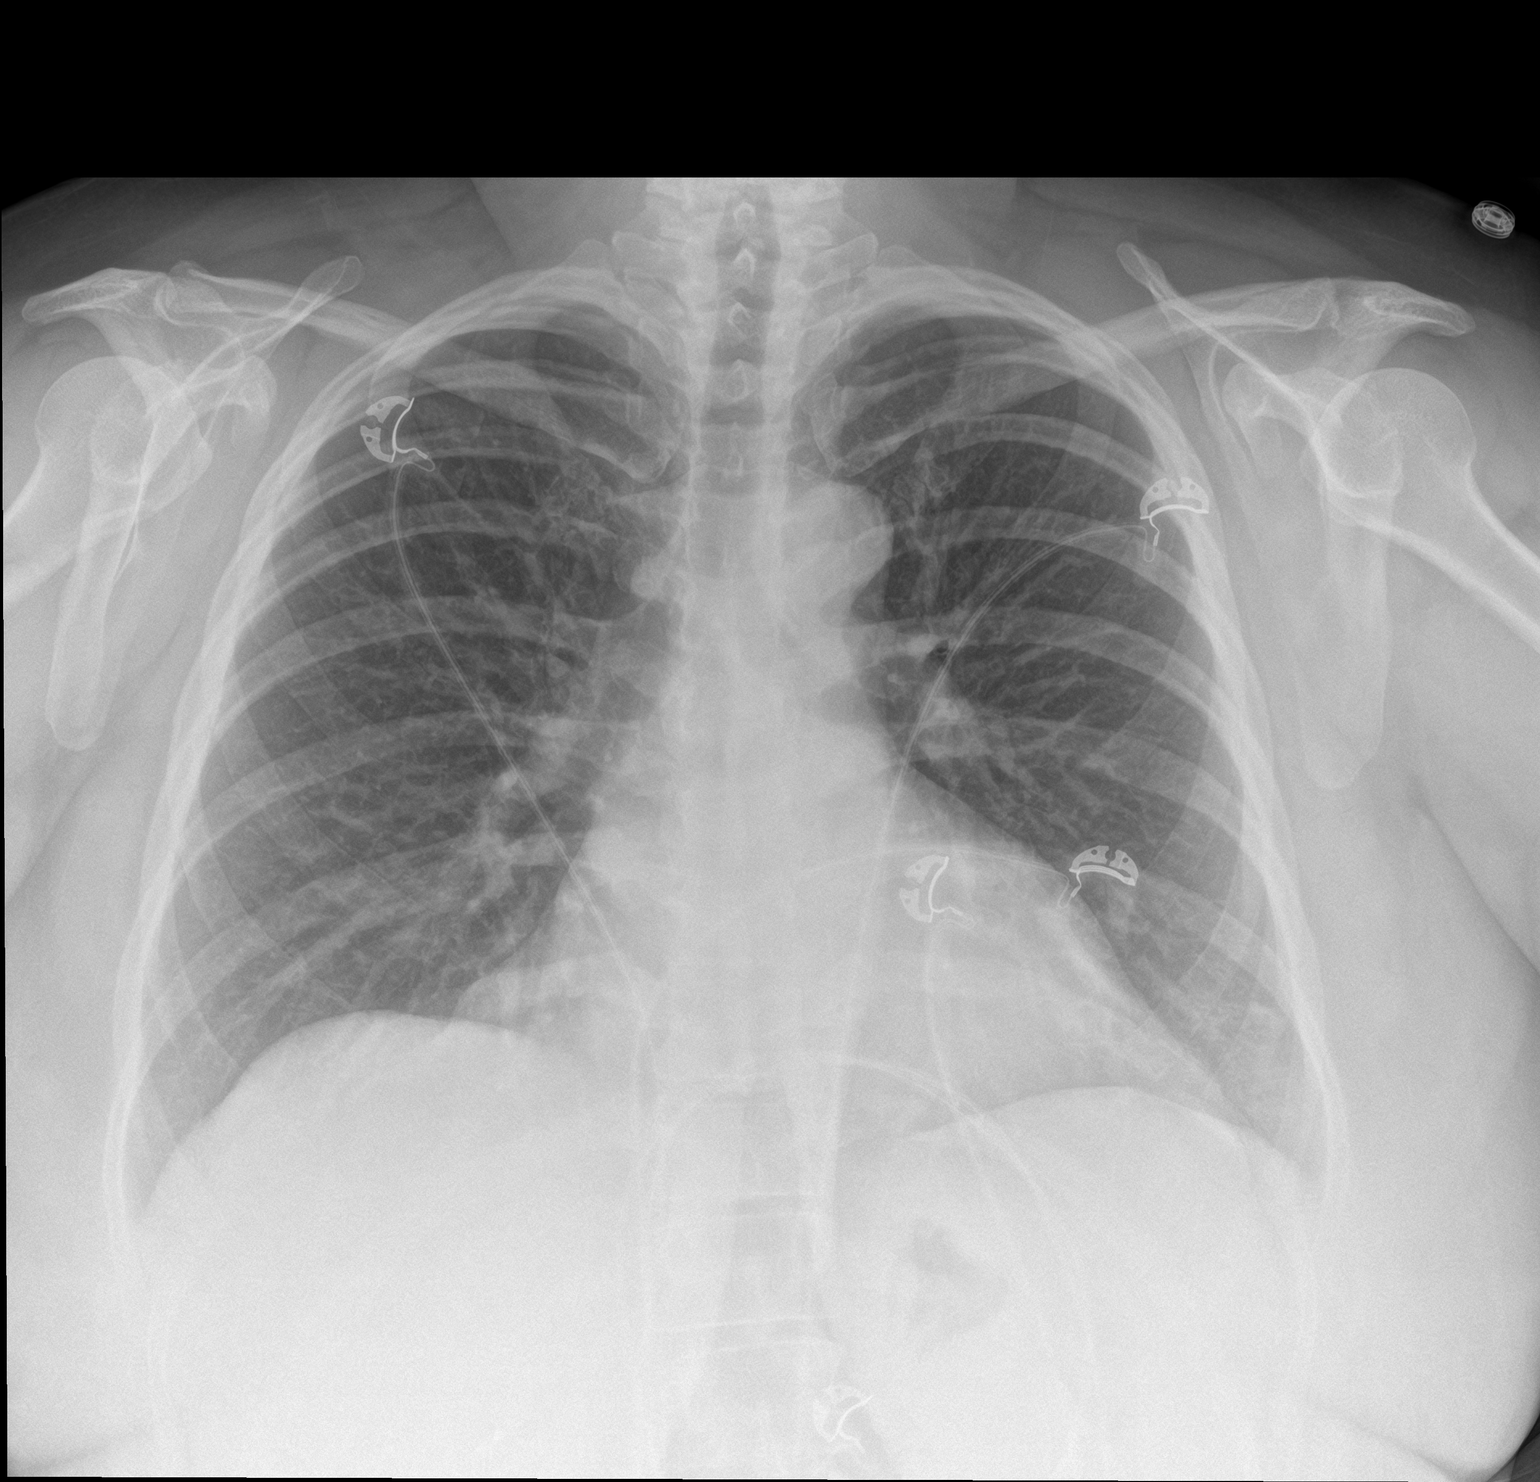

[chest lat]
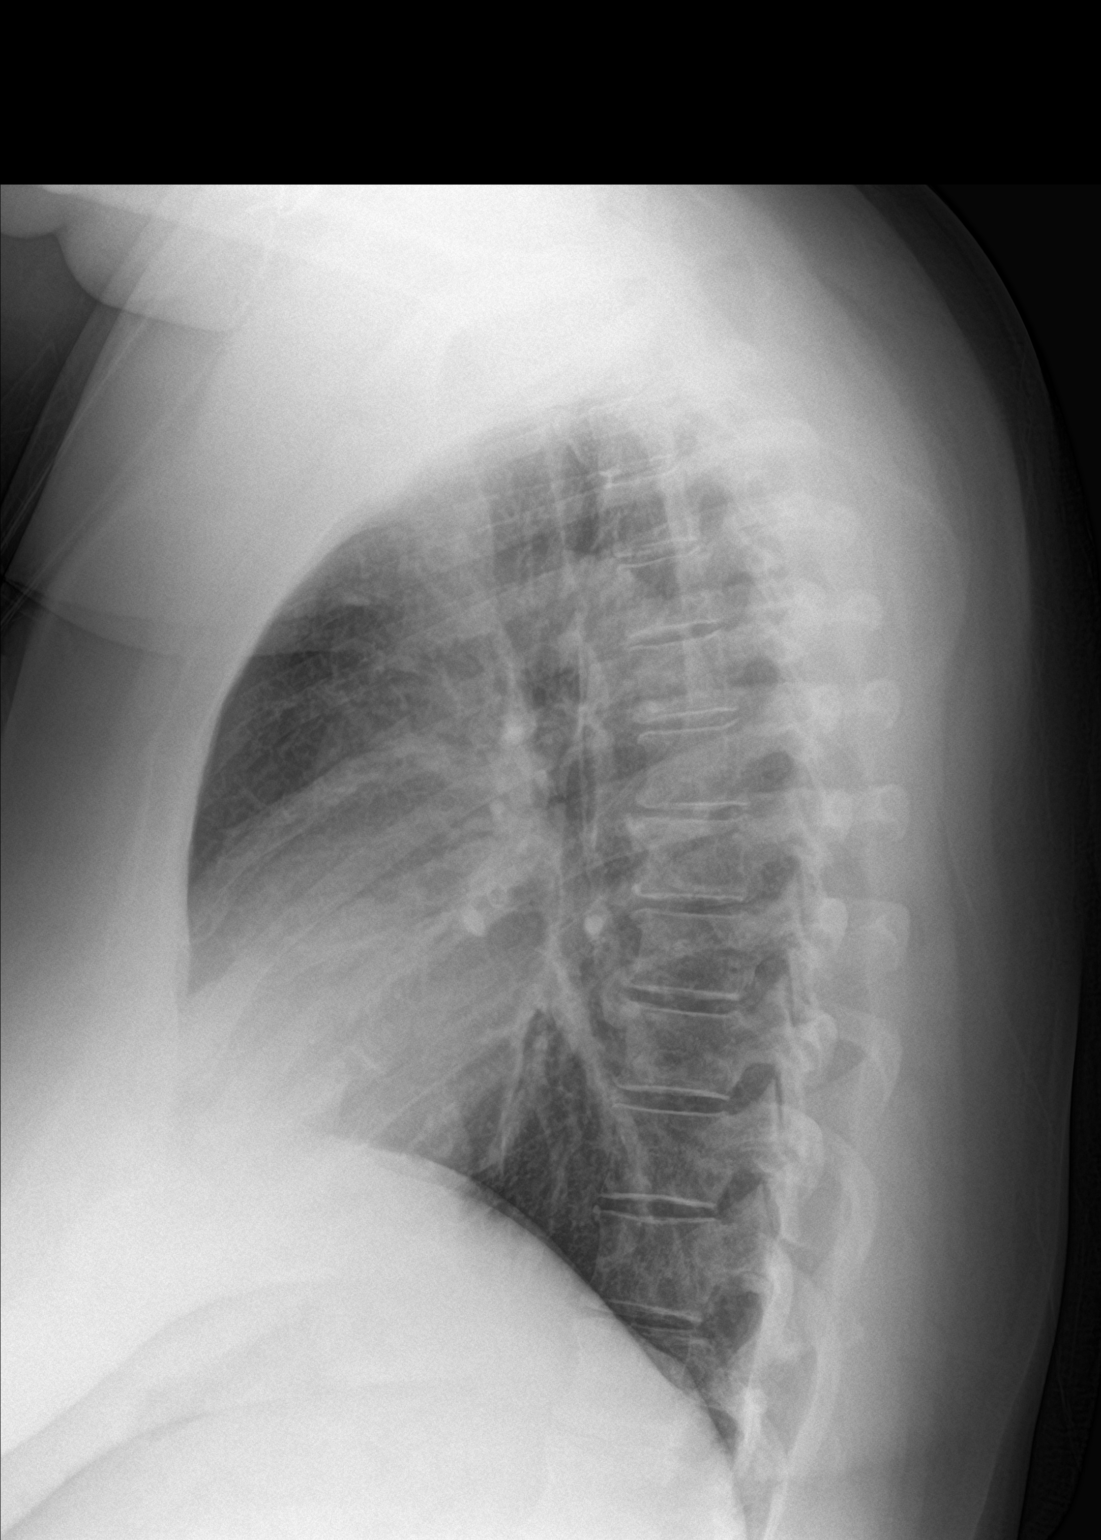

[2 of 2 positions shown; findings below may reference images not displayed]

FINDINGS: The heart size and mediastinal contours are within normal limits.
Both lungs are clear. The visualized skeletal structures are
unremarkable.
IMPRESSION: No active cardiopulmonary disease.

## 2018-02-06 IMAGING — CT CT ABD-PELV W/ CM
2 of 5 series · 16 of 46 positions shown, 18 images · IV contrast (iopamidol)
Comparison: CT abdomen and pelvis [DATE]

CLINICAL DATA: Epigastric pain beginning last night, early satiety.
History of reflux disease, diverticulosis, cholecystectomy,
hysterectomy.

EXAM:
CT ABDOMEN AND PELVIS WITH CONTRAST
TECHNIQUE: Multidetector CT imaging of the abdomen and pelvis was performed
using the standard protocol following bolus administration of
intravenous contrast.
CONTRAST:  100mL [7F] IOPAMIDOL ([7F]) INJECTION 61%

[Series 2: axial st · axial · 0.85mm/px · z∈[+845,+1285]mm · 13 of 100 slices shown, 15 images]
[im 6/100  soft-tissue]
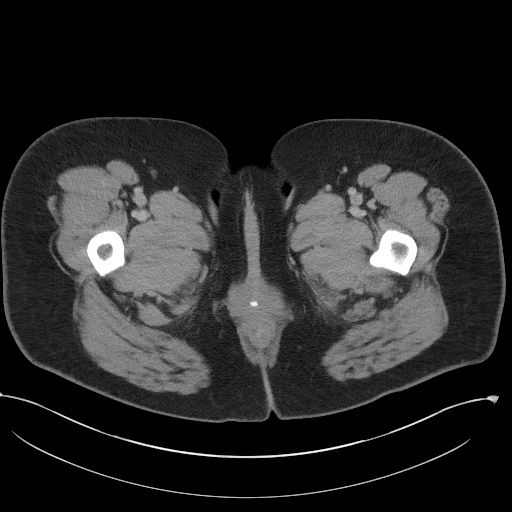
[im 6/100  bone]
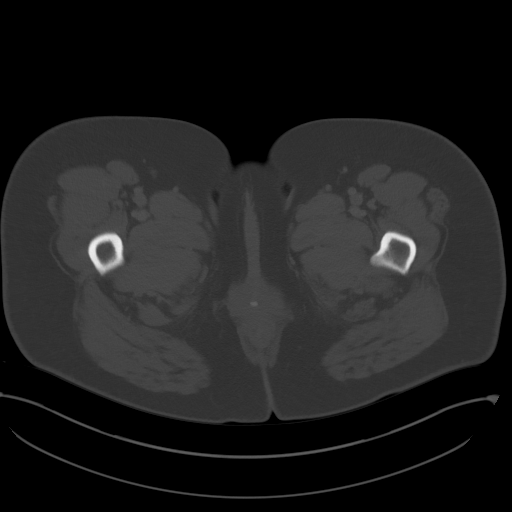
[im 12/100  soft-tissue]
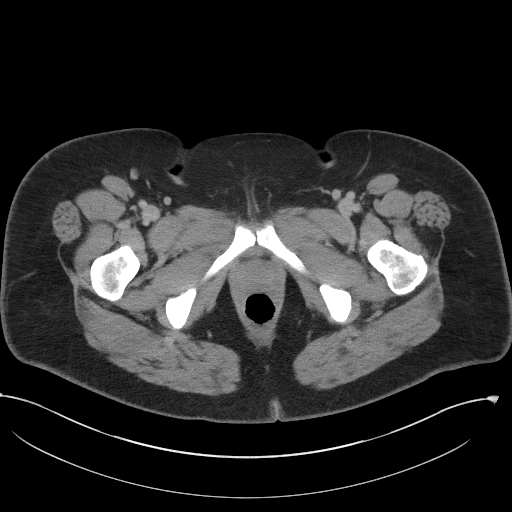
[im 24/100  soft-tissue]
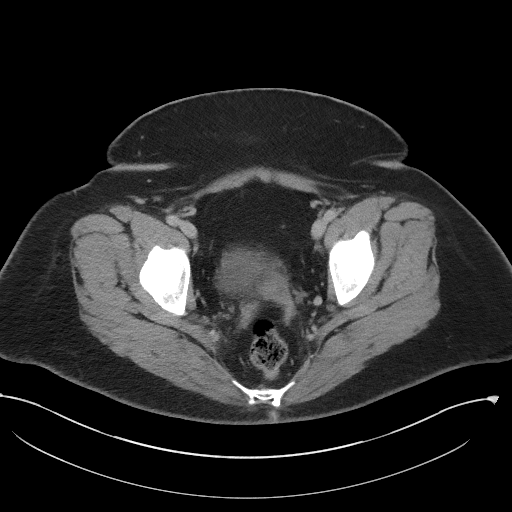
[im 30/100  soft-tissue]
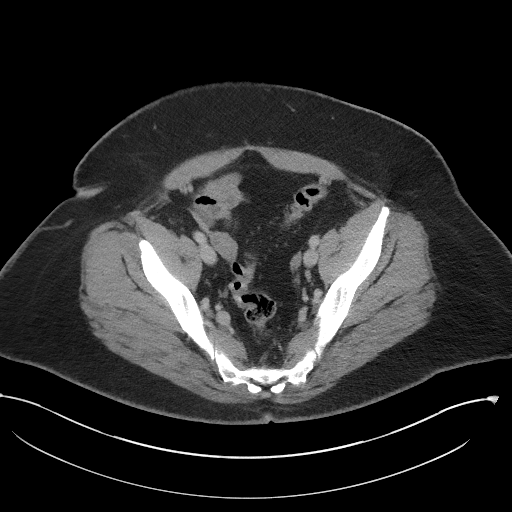
[im 35/100  soft-tissue]
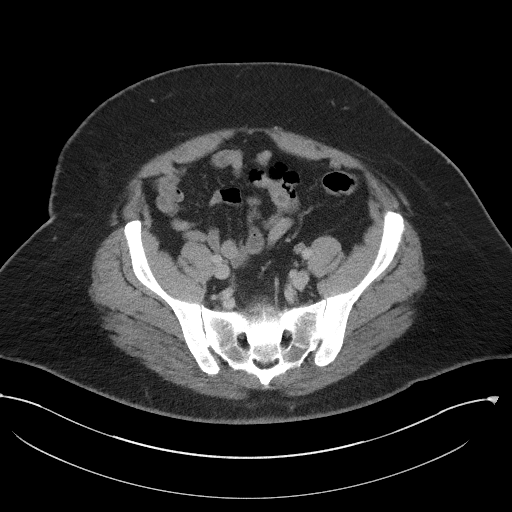
[im 41/100  soft-tissue]
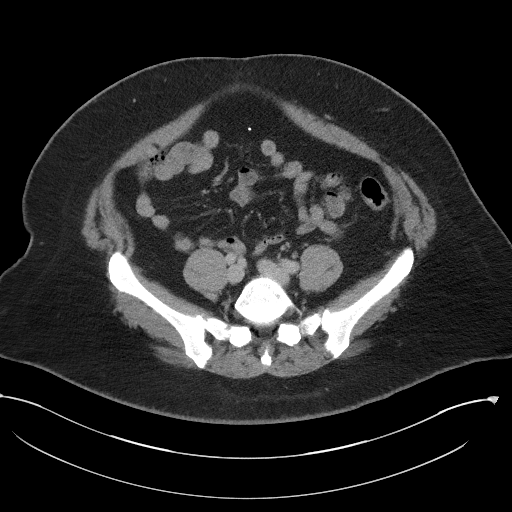
[im 53/100  soft-tissue]
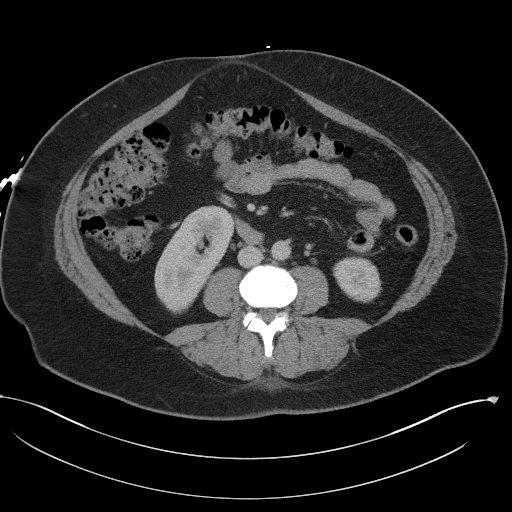
[im 59/100  soft-tissue]
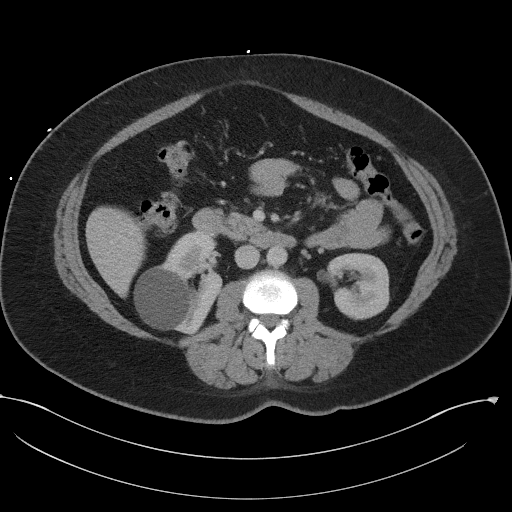
[im 65/100  soft-tissue]
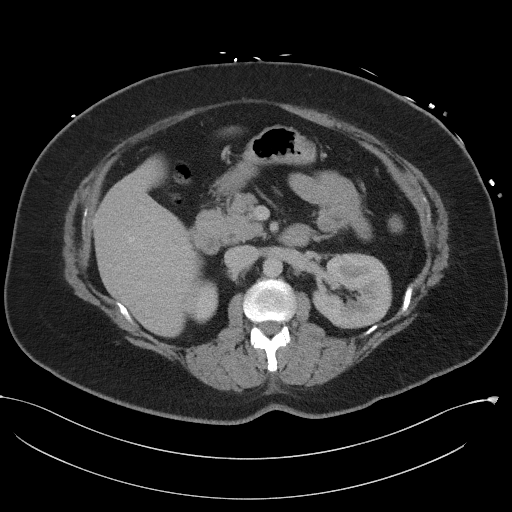
[im 65/100  bone]
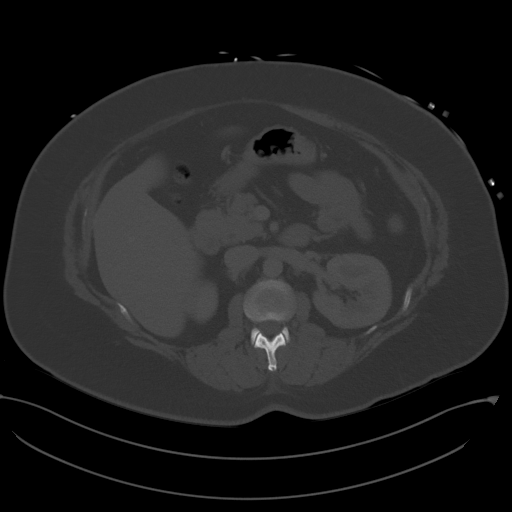
[im 70/100  soft-tissue]
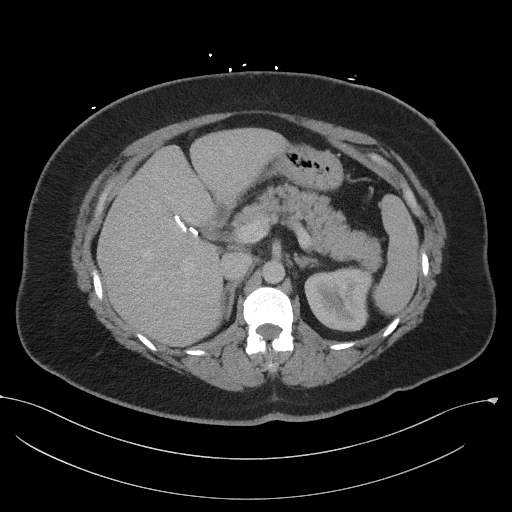
[im 76/100  soft-tissue]
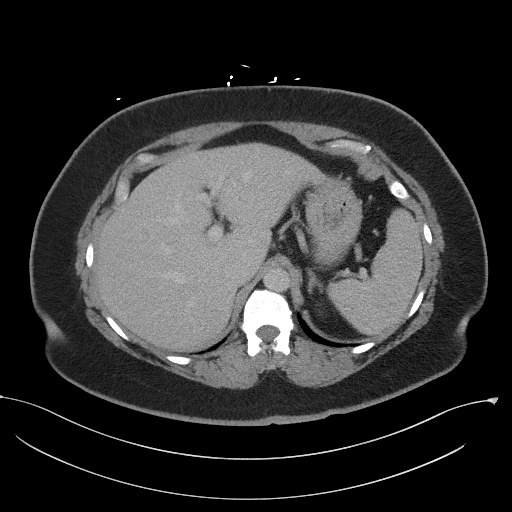
[im 88/100  soft-tissue]
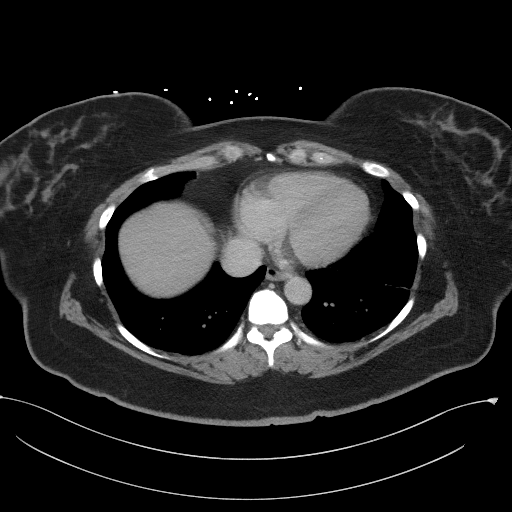
[im 94/100  soft-tissue]
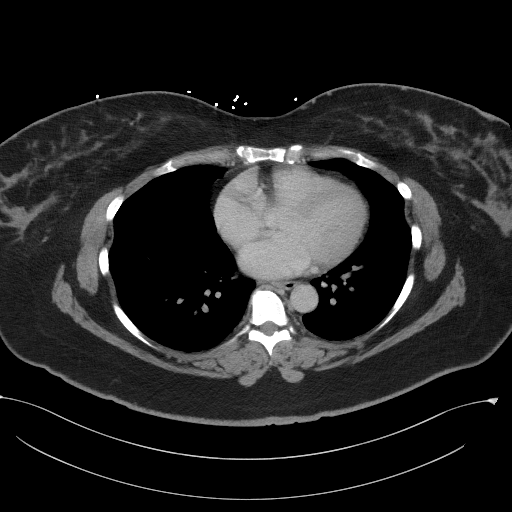

[Series 5: coronal st · coronal · 0.93mm/px · 3 of 122 slices shown]
[im 41/122  soft-tissue]
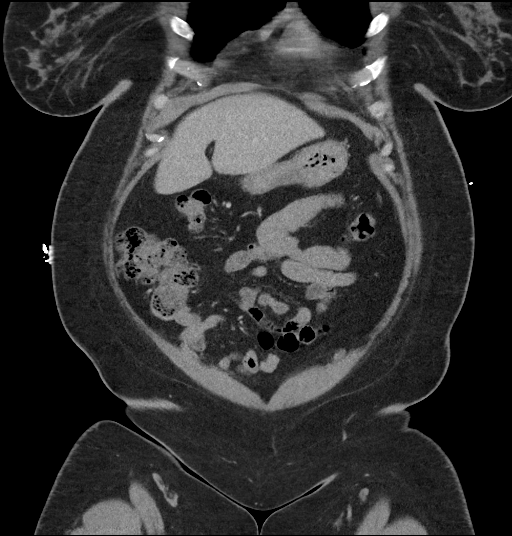
[im 54/122  soft-tissue]
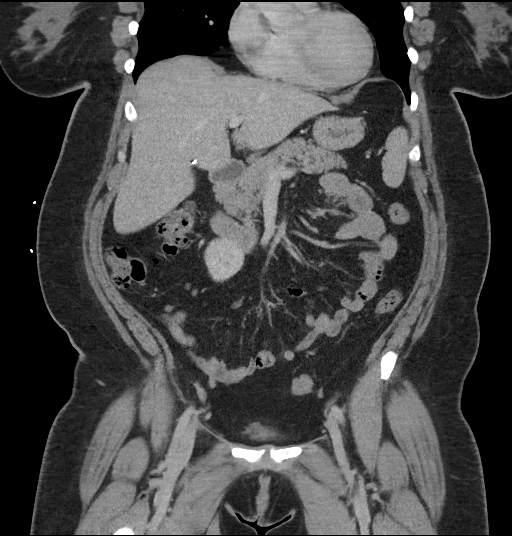
[im 68/122  soft-tissue]
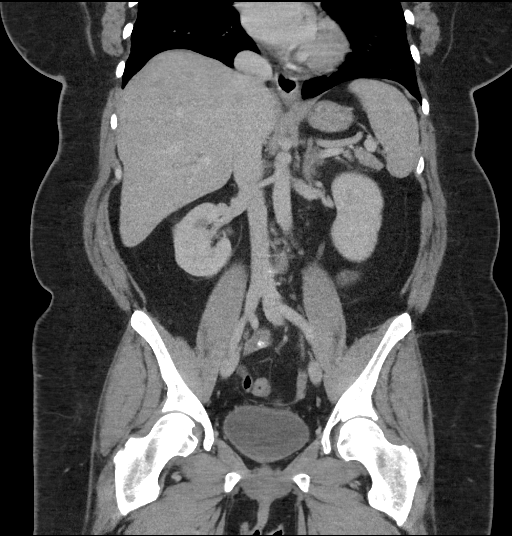

[16 of 46 positions shown; findings below may reference images not displayed]

FINDINGS: LOWER CHEST: LEFT lung base atelectasis/scarring. Included heart
size is normal. No pericardial effusion.

HEPATOBILIARY: Status post cholecystectomy.

PANCREAS: Normal.

SPLEEN: Normal.

ADRENALS/URINARY TRACT: Kidneys are orthotopic, demonstrating
symmetric enhancement. No nephrolithiasis, hydronephrosis or solid
renal masses. Homogeneously hypodense benign-appearing cyst 5.9 cm
upper pole RIGHT kidney. The unopacified ureters are normal in
course and caliber. Delayed imaging through the kidneys demonstrates
symmetric prompt contrast excretion within the proximal urinary
collecting system. Urinary bladder is partially distended harboring
no intravesicular calculi. Normal adrenal glands.

STOMACH/BOWEL: Small hiatal hernia. The stomach, small and large
bowel are normal in course and caliber without inflammatory changes,
sensitivity decreased without contrast. Mild colonic diverticulosis.
Normal appendix.

VASCULAR/LYMPHATIC: Aortoiliac vessels are normal in course and
caliber. No lymphadenopathy by CT size criteria.

REPRODUCTIVE: Status post hysterectomy.

OTHER: No intraperitoneal free fluid or free air.

MUSCULOSKELETAL: Nonacute. Rectus abdominis diastasis. Mild
sacroiliac osteoarthrosis.
IMPRESSION: 1. No acute intra-abdominal/pelvic process.
2. Mild colonic diverticulosis.

## 2018-02-06 MED ORDER — FAMOTIDINE IN NACL 20-0.9 MG/50ML-% IV SOLN
20.0000 mg | Freq: Once | INTRAVENOUS | Status: AC
  Administered 2018-02-06: 20 mg via INTRAVENOUS
  Filled 2018-02-06: qty 50

## 2018-02-06 MED ORDER — METOCLOPRAMIDE HCL 5 MG/ML IJ SOLN
10.0000 mg | Freq: Once | INTRAMUSCULAR | Status: AC
  Administered 2018-02-06: 10 mg via INTRAVENOUS
  Filled 2018-02-06: qty 2

## 2018-02-06 MED ORDER — SODIUM CHLORIDE 0.9 % IV BOLUS
1000.0000 mL | Freq: Once | INTRAVENOUS | Status: AC
  Administered 2018-02-06: 1000 mL via INTRAVENOUS

## 2018-02-06 MED ORDER — IOPAMIDOL (ISOVUE-300) INJECTION 61%
100.0000 mL | Freq: Once | INTRAVENOUS | Status: AC | PRN
  Administered 2018-02-06: 100 mL via INTRAVENOUS

## 2018-02-06 MED ORDER — FAMOTIDINE 20 MG PO TABS: 20.0000 mg | ORAL_TABLET | Freq: Two times a day (BID) | ORAL | 0 refills | Status: DC

## 2018-02-06 MED ORDER — METOCLOPRAMIDE HCL 10 MG PO TABS: 10.0000 mg | ORAL_TABLET | Freq: Four times a day (QID) | ORAL | 0 refills | Status: DC | PRN

## 2018-02-06 NOTE — ED Triage Notes (Signed)
Pt states she has had epigastric pain that started around 1800 last night. Pt C/O nausea. Pt states that she has been unable to eat because "I feel full after 2 bites."

## 2018-02-06 NOTE — ED Provider Notes (Addendum)
[facility] EMERGENCY DEPARTMENT Provider Note   CSN: [id number] Arrival date & time: 02/06/18  1854     History   Chief Complaint Chief Complaint  Patient presents with  . Abdominal Pain    HPI Heather Fry is a 42 y.o. female.  HPI Pt was seen at [place].   Per pt, c/o gradual onset and persistence of constant mid-epigastric abd "pain" since 1800 yesterday.  Has been associated with nausea and multiple intermittent episodes of diarrhea.  Describes the abd pain as "burning" and "bloating."  States she has been unable to eat because she "feels full after 2 bites."  Denies vomiting, no fevers, no back pain, no rash, no CP/SOB, no black or blood in stools.      Past Medical History:  Diagnosis Date  . Anxiety   . Depression   . Diverticulosis   . GERD (gastroesophageal reflux disease)   . Internal hemorrhoids   . Thyroid disease   . Uterine fibroid     Patient Active Problem List   Diagnosis Date Noted  . GERD (gastroesophageal reflux disease) 01/28/2017  . Rectal bleeding 01/28/2017  . 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 Hypothyroidism 04/14/2016  . Gastric polyp   . RUQ abdominal pain 09/06/2014  . Excessive or frequent menstruation 01/13/2013  . Depression     Past Surgical History:  Procedure Laterality Date  . ABDOMINAL HYSTERECTOMY     Per patient, uterine fibroids.  . ABDOMINAL HYSTERECTOMY    . CHOLECYSTECTOMY N/A 11/06/2014   Procedure: LAPAROSCOPIC CHOLECYSTECTOMY;  Surgeon: Jamesetta So, MD;  Location: [facility] ORS;  Service: General;  Laterality: N/A;  . COLONOSCOPY N/A 03/04/2017   Procedure: COLONOSCOPY;  Surgeon: Daneil Dolin, MD;  Location: [facility] ENDO SUITE;  Service: Endoscopy;  Laterality: N/A;  8:30am  . DILATION AND CURETTAGE OF UTERUS    . ESOPHAGOGASTRODUODENOSCOPY N/A 10/02/2014   Procedure: ESOPHAGOGASTRODUODENOSCOPY (EGD);  Surgeon: Daneil Dolin, MD;  Location: [facility];  Service: Endoscopy;  Laterality: N/A;  945am - moved to 8:45 - Ginger  notified pt  . MOUTH SURGERY     extraction of teeth  . POLYPECTOMY  03/04/2017   Procedure: POLYPECTOMY;  Surgeon: Daneil Dolin, MD;  Location: [facility];  Service: Endoscopy;;  colon  . TUBAL LIGATION       OB History    Gravida  2   Para  2   Term  2   Preterm      AB      Living  2     SAB      TAB      Ectopic      Multiple      Live Births               Home Medications    Prior to Admission medications   Medication Sig Start Date End Date Taking? Authorizing Provider  cholecalciferol (VITAMIN D) 1000 units tablet Take 1 tablet (1,000 Units total) by mouth daily. 01/27/18   Cassandria Anger, MD  diphenhydrAMINE (BENADRYL) 25 mg capsule Take 50 mg by mouth at bedtime as needed for allergies or sleep.    [provider]  escitalopram (LEXAPRO) 20 MG tablet Take 20 mg by mouth at bedtime.     [provider]  metoprolol tartrate (LOPRESSOR) 25 MG tablet Take 1 tablet (25 mg total) by mouth 2 (two) times daily. 01/26/18   Cassandria Anger, MD  Multiple Vitamin (MULTIVITAMIN WITH MINERALS)  TABS Take 1 tablet by mouth daily.    [provider]  Omega-3 Fatty Acids (FISH OIL) 1200 MG CAPS Take 1 capsule by mouth at bedtime.     [provider]  Omeprazole 20 MG TBEC Take 1 tablet (20 mg total) by mouth daily before breakfast. 01/28/17   Mahala Menghini, PA-C  pseudoephedrine-acetaminophen (TYLENOL SINUS) 30-500 MG TABS tablet Take 1 tablet by mouth every 4 (four) hours as needed.    [provider]  simethicone (PHAZYME) 125 MG chewable tablet Chew 125 mg by mouth every 6 (six) hours as needed for flatulence.    [provider]  Wheat Dextrin (BENEFIBER DRINK MIX PO) Take 1 Package by mouth 3 (three) times daily.    [provider]    Family History Family History  Problem Relation Age of Onset  . Hypertension Father   . Diabetes Father   . Depression Other   . Uterine cancer Mother         ?cervical cancer  . Uterine cancer Sister        suicide  . Colon cancer Paternal Aunt 61    Social History Social History   Tobacco Use  . Smoking status: Former Smoker    Packs/day: 1.00    Years: 3.00    Pack years: 3.00    Types: Cigarettes    Last attempt to quit: 11/03/1997    Years since quitting: 20.2  . Smokeless tobacco: Never Used  Substance Use Topics  . Alcohol use: No    Alcohol/week: 0.0 oz  . Drug use: No     Allergies   Levofloxacin; Lisinopril; and Penicillins   Review of Systems Review of Systems ROS: Statement: All systems negative except as marked or noted in the HPI; Constitutional: Negative for fever and chills. ; ; Eyes: Negative for eye pain, redness and discharge. ; ; ENMT: Negative for ear pain, hoarseness, nasal congestion, sinus pressure and sore throat. ; ; Cardiovascular: Negative for chest pain, palpitations, diaphoresis, dyspnea and peripheral edema. ; ; Respiratory: Negative for cough, wheezing and stridor. ; ; Gastrointestinal: +nausea, diarrhea, abd pain. Negative for vomiting, blood in stool, hematemesis, jaundice and rectal bleeding. . ; ; Genitourinary: Negative for dysuria, flank pain and hematuria. ; ; Musculoskeletal: Negative for back pain and neck pain. Negative for swelling and trauma.; ; Skin: Negative for pruritus, rash, abrasions, blisters, bruising and skin lesion.; ; Neuro: Negative for headache, lightheadedness and neck stiffness. Negative for weakness, altered level of consciousness, altered mental status, extremity weakness, paresthesias, involuntary movement, seizure and syncope.       Physical Exam Updated Vital Signs BP (!) 139/94 (BP Location: Right Arm)   Pulse 93   Temp 97.9 F (36.6 C) (Oral)   Resp 19   LMP 10/09/2014   SpO2 100%   Physical Exam 2000: Physical examination:  Nursing notes reviewed; Vital signs and O2 SAT reviewed;  Constitutional: Well developed, Well nourished, Well hydrated, In no acute  distress; Head:  Normocephalic, atraumatic; Eyes: EOMI, PERRL, No scleral icterus; ENMT: Mouth and pharynx normal, Mucous membranes moist; Neck: Supple, Full range of motion, No lymphadenopathy; Cardiovascular: Regular rate and rhythm, No gallop; Respiratory: Breath sounds clear & equal bilaterally, No wheezes.  Speaking full sentences with ease, Normal respiratory effort/excursion; Chest: Nontender, Movement normal; Abdomen: Soft, +mid-epigastric tenderness to palp. No rebound or guarding. Nondistended, Normal bowel sounds; Genitourinary: No CVA tenderness; Extremities: Peripheral pulses normal, No tenderness, No edema, No calf edema or asymmetry.;  Neuro: AA&Ox3, Major CN grossly intact.  Speech clear. No gross focal motor or sensory deficits in extremities.; Skin: Color normal, Warm, Dry.   ED Treatments / Results  Labs (all labs ordered are listed, but only abnormal results are displayed)   EKG None  Radiology   Procedures Procedures (including critical care time)  Medications Ordered in ED Medications  famotidine (PEPCID) IVPB 20 mg premix (20 mg Intravenous New Bag/Given 02/06/18 2010)  sodium chloride 0.9 % bolus 1,000 mL (1,000 mLs Intravenous New Bag/Given 02/06/18 2010)  metoCLOPramide (REGLAN) injection 10 mg (10 mg Intravenous Given 02/06/18 2009)     Initial Impression / Assessment and Plan / ED Course  I have reviewed the triage vital signs and the nursing notes.  Pertinent labs & imaging results that were available during my care of the patient were reviewed by me and considered in my medical decision making (see chart for details).  MDM Reviewed: previous chart, nursing note and vitals Reviewed previous: labs and ECG Interpretation: labs, ECG, x-ray and CT scan   ED ECG REPORT   Date: 02/06/2018  Rate: 86  Rhythm: normal sinus rhythm  QRS Axis: normal  Intervals: normal  ST/T Wave abnormalities: normal  Conduction Disutrbances:none  Narrative Interpretation:     Old EKG Reviewed: unchanged; no significant changes from previous EKG dated 02/04/2018. I have personally reviewed the EKG tracing and agree with the computerized printout as noted.   Results for orders placed or performed during the hospital encounter of 02/06/18  Lipase, blood  Result Value Ref Range   Lipase 38 11 - 51 U/L  Comprehensive metabolic panel  Result Value Ref Range   Sodium 139 135 - 145 mmol/L   Potassium 3.6 3.5 - 5.1 mmol/L   Chloride 104 101 - 111 mmol/L   CO2 25 22 - 32 mmol/L   Glucose, Bld 117 (H) 65 - 99 mg/dL   BUN 9 6 - 20 mg/dL   Creatinine, Ser 0.61 0.44 - 1.00 mg/dL   Calcium 9.6 8.9 - 10.3 mg/dL   Total Protein 8.0 6.5 - 8.1 g/dL   Albumin 4.0 3.5 - 5.0 g/dL   AST 22 15 - 41 U/L   ALT 25 14 - 54 U/L   Alkaline Phosphatase 56 38 - 126 U/L   Total Bilirubin 0.5 0.3 - 1.2 mg/dL   GFR calc non Af Amer >60 >60 mL/min   GFR calc Af Amer >60 >60 mL/min   Anion gap 10 5 - 15  CBC  Result Value Ref Range   WBC 10.6 (H) 4.0 - 10.5 K/uL   RBC 4.74 3.87 - 5.11 MIL/uL   Hemoglobin 14.0 12.0 - 15.0 g/dL   HCT 42.0 36.0 - 46.0 %   MCV 88.6 78.0 - 100.0 fL   MCH 29.5 26.0 - 34.0 pg   MCHC 33.3 30.0 - 36.0 g/dL   RDW 12.9 11.5 - 15.5 %   Platelets 283 150 - 400 K/uL  Urinalysis, Routine w reflex microscopic  Result Value Ref Range   Color, Urine YELLOW YELLOW   APPearance HAZY (A) CLEAR   Specific Gravity, Urine 1.009 1.005 - 1.030   pH 6.0 5.0 - 8.0   Glucose, UA NEGATIVE NEGATIVE mg/dL   Hgb urine dipstick NEGATIVE NEGATIVE   Bilirubin Urine NEGATIVE NEGATIVE   Ketones, ur NEGATIVE NEGATIVE mg/dL   Protein, ur NEGATIVE NEGATIVE mg/dL   Nitrite NEGATIVE NEGATIVE   Leukocytes, UA NEGATIVE NEGATIVE  Troponin I  Result Value Ref  Range   Troponin I <0.03 <0.03 ng/mL  I-Stat beta hCG blood, ED  Result Value Ref Range   I-stat hCG, quantitative <5.0 <5 mIU/mL   Comment 3          POC CBG, ED  Result Value Ref Range   Glucose-Capillary 134 (H) 65 -  99 mg/dL   Comment 1 Notify RN    Comment 2 Document in Chart   CBG monitoring, ED  Result Value Ref Range   Glucose-Capillary 101 (H) 65 - 99 mg/dL   Comment 1 Notify RN    Comment 2 Document in Chart     Dg Chest 2 View Result Date: 02/06/2018 CLINICAL DATA:  Abdominal pain and nausea EXAM: CHEST - 2 VIEW COMPARISON:  02/04/2018 FINDINGS: The heart size and mediastinal contours are within normal limits. Both lungs are clear. The visualized skeletal structures are unremarkable. IMPRESSION: No active cardiopulmonary disease. Electronically Signed   By: Inez Catalina M.D.   On: 02/06/2018 22:15    Ct Abdomen Pelvis W Contrast Result Date: 02/06/2018 CLINICAL DATA:  Epigastric pain beginning last night, early satiety. History of reflux disease, diverticulosis, cholecystectomy, hysterectomy. EXAM: CT ABDOMEN AND PELVIS WITH CONTRAST TECHNIQUE: Multidetector CT imaging of the abdomen and pelvis was performed using the standard protocol following bolus administration of intravenous contrast. CONTRAST:  192mL ISOVUE-300 IOPAMIDOL (ISOVUE-300) INJECTION 61% COMPARISON:  CT abdomen and pelvis June 07, 2017 FINDINGS: LOWER CHEST: LEFT lung base atelectasis/scarring. Included heart size is normal. No pericardial effusion. HEPATOBILIARY: Status post cholecystectomy. PANCREAS: Normal. SPLEEN: Normal. ADRENALS/URINARY TRACT: Kidneys are orthotopic, demonstrating symmetric enhancement. No nephrolithiasis, hydronephrosis or solid renal masses. Homogeneously hypodense benign-appearing cyst 5.9 cm upper pole RIGHT kidney. The unopacified ureters are normal in course and caliber. Delayed imaging through the kidneys demonstrates symmetric prompt contrast excretion within the proximal urinary collecting system. Urinary bladder is partially distended harboring no intravesicular calculi. Normal adrenal glands. STOMACH/BOWEL: Small hiatal hernia. The stomach, small and large bowel are normal in course and caliber without  inflammatory changes, sensitivity decreased without contrast. Mild colonic diverticulosis. Normal appendix. VASCULAR/LYMPHATIC: Aortoiliac vessels are normal in course and caliber. No lymphadenopathy by CT size criteria. REPRODUCTIVE: Status post hysterectomy. OTHER: No intraperitoneal free fluid or free air. MUSCULOSKELETAL: Nonacute. Rectus abdominis diastasis. Mild sacroiliac osteoarthrosis. IMPRESSION: 1. No acute intra-abdominal/pelvic process. 2. Mild colonic diverticulosis. Electronically Signed   By: Elon Alas M.D.   On: 02/06/2018 22:16    2220:  Workup reassuring. Pt has tol PO well while in the ED without N/V.  No stooling while in the ED.  Abd benign, VSS. Feels better and wants to go home now. Tx symptomatically, f/u GI MD. Dx and testing d/w pt and family.  Questions answered.  Verb understanding, agreeable to d/c home with outpt f/u.    Final Clinical Impressions(s) / ED Diagnoses   Final diagnoses:  None    ED Discharge Orders    None         Francine Graven, DO 02/12/18 3299

## 2018-02-06 NOTE — Discharge Instructions (Signed)
Eat a bland diet, avoiding greasy, fatty, fried foods, as well as spicy and acidic foods or beverages.  Avoid eating within 2 to 3 hours before going to bed or laying down.  Also avoid teas, colas, coffee, chocolate, pepermint and spearment.  Take the prescriptions as directed.  Call your regular medical doctor, your Endocrinologist and your GI doctor on Monday to schedule a follow up appointment within the next week.  Return to the Emergency Department immediately if worsening.

## 2018-02-08 ENCOUNTER — Ambulatory Visit (INDEPENDENT_AMBULATORY_CARE_PROVIDER_SITE_OTHER): Payer: BLUE CROSS/BLUE SHIELD | Admitting: "Endocrinology

## 2018-02-08 ENCOUNTER — Encounter: Payer: Self-pay | Admitting: "Endocrinology

## 2018-02-08 VITALS — BP 125/86 | HR 78 | Ht 64.0 in | Wt 222.0 lb

## 2018-02-08 DIAGNOSIS — R5383 Other fatigue: Secondary | ICD-10-CM | POA: Diagnosis not present

## 2018-02-08 DIAGNOSIS — E0590 Thyrotoxicosis, unspecified without thyrotoxic crisis or storm: Secondary | ICD-10-CM | POA: Diagnosis not present

## 2018-02-08 DIAGNOSIS — Z6839 Body mass index (BMI) 39.0-39.9, adult: Secondary | ICD-10-CM | POA: Diagnosis not present

## 2018-02-08 DIAGNOSIS — Z1389 Encounter for screening for other disorder: Secondary | ICD-10-CM | POA: Diagnosis not present

## 2018-02-08 MED ORDER — METHIMAZOLE 5 MG PO TABS: 5.0000 mg | ORAL_TABLET | Freq: Every day | ORAL | 3 refills | Status: DC

## 2018-02-08 NOTE — Progress Notes (Signed)
Subjective:    Patient ID: Heather Fry, female    DOB: 12-24-1975, PCP Redmond School, MD   Past Medical History:  Diagnosis Date  . Anxiety   . Depression   . Diverticulosis   . GERD (gastroesophageal reflux disease)   . Internal hemorrhoids   . Thyroid disease   . Uterine fibroid    Past Surgical History:  Procedure Laterality Date  . ABDOMINAL HYSTERECTOMY     Per patient, uterine fibroids.  . ABDOMINAL HYSTERECTOMY    . CHOLECYSTECTOMY N/A 11/06/2014   Procedure: LAPAROSCOPIC CHOLECYSTECTOMY;  Surgeon: Jamesetta So, MD;  Location: [facility];  Service: General;  Laterality: N/A;  . COLONOSCOPY N/A 03/04/2017   Procedure: COLONOSCOPY;  Surgeon: Daneil Dolin, MD;  Location: [facility];  Service: Endoscopy;  Laterality: N/A;  8:30am  . DILATION AND CURETTAGE OF UTERUS    . ESOPHAGOGASTRODUODENOSCOPY N/A 10/02/2014   Procedure: ESOPHAGOGASTRODUODENOSCOPY (EGD);  Surgeon: Daneil Dolin, MD;  Location: [facility];  Service: Endoscopy;  Laterality: N/A;  945am - moved to 8:45 - Ginger notified pt  . MOUTH SURGERY     extraction of teeth  . POLYPECTOMY  03/04/2017   Procedure: POLYPECTOMY;  Surgeon: Daneil Dolin, MD;  Location: [facility];  Service: Endoscopy;;  colon  . TUBAL LIGATION     Social History   Socioeconomic History  . Marital status: Married    Spouse name: Not on file  . Number of children: Not on file  . Years of education: Not on file  . Highest education level: Not on file  Occupational History  . Not on file  Social Needs  . Financial resource strain: Not on file  . Food insecurity:    Worry: Not on file    Inability: Not on file  . Transportation needs:    Medical: Not on file    Non-medical: Not on file  Tobacco Use  . Smoking status: Former Smoker    Packs/day: 1.00    Years: 3.00    Pack years: 3.00    Types: Cigarettes    Last attempt to quit: 11/03/1997    Years since quitting: 20.2  . Smokeless tobacco: Never Used   Substance and Sexual Activity  . Alcohol use: No    Alcohol/week: 0.0 oz  . Drug use: No  . Sexual activity: Yes    Birth control/protection: Surgical  Lifestyle  . Physical activity:    Days per week: Not on file    Minutes per session: Not on file  . Stress: Not on file  Relationships  . Social connections:    Talks on phone: Not on file    Gets together: Not on file    Attends religious service: Not on file    Active member of club or organization: Not on file    Attends meetings of clubs or organizations: Not on file    Relationship status: Not on file  Other Topics Concern  . Not on file  Social History Narrative  . Not on file   Outpatient Encounter Medications as of 02/08/2018  Medication Sig  . cholecalciferol (VITAMIN D) 1000 units tablet Take 1 tablet (1,000 Units total) by mouth daily.  . diphenhydrAMINE (BENADRYL) 25 mg capsule Take 50 mg by mouth at bedtime as needed for allergies or sleep.  Marland Kitchen escitalopram (LEXAPRO) 20 MG tablet Take 20 mg by mouth at bedtime.   . famotidine (PEPCID) 20 MG tablet Take 1  tablet (20 mg total) by mouth 2 (two) times daily.  . methimazole (TAPAZOLE) 5 MG tablet Take 1 tablet (5 mg total) by mouth daily.  . metoCLOPramide (REGLAN) 10 MG tablet Take 1 tablet (10 mg total) by mouth every 6 (six) hours as needed for nausea or vomiting.  . metoprolol tartrate (LOPRESSOR) 25 MG tablet Take 1 tablet (25 mg total) by mouth 2 (two) times daily.  . Multiple Vitamin (MULTIVITAMIN WITH MINERALS) TABS Take 1 tablet by mouth daily.  . Omega-3 Fatty Acids (FISH OIL) 1200 MG CAPS Take 1 capsule by mouth at bedtime.   . Omeprazole 20 MG TBEC Take 1 tablet (20 mg total) by mouth daily before breakfast.  . pseudoephedrine-acetaminophen (TYLENOL SINUS) 30-500 MG TABS tablet Take 1 tablet by mouth every 4 (four) hours as needed.  . simethicone (PHAZYME) 125 MG chewable tablet Chew 125 mg by mouth every 6 (six) hours as needed for flatulence.  . Wheat  Dextrin (BENEFIBER DRINK MIX PO) Take 1 Package by mouth 3 (three) times daily.   No facility-administered encounter medications on file as of 02/08/2018.    ALLERGIES: Allergies  Allergen Reactions  . Levofloxacin Other (See Comments)    Pt can not have due to taking Lexapro   . Lisinopril Swelling  . Penicillins Other (See Comments)    Loopy Has patient had a PCN reaction causing immediate rash, facial/tongue/throat swelling, SOB or lightheadedness with hypotension: No Has patient had a PCN reaction causing severe rash involving mucus membranes or skin necrosis: No Has patient had a PCN reaction that required hospitalization: No Has patient had a PCN reaction occurring within the last 10 years: No If all of the above answers are "NO", then may proceed with Cephalosporin use.  "knocked out.     VACCINATION STATUS:  There is no immunization history on file for this patient.  HPI 42 year old female patient with medical history as above. She is being seen in follow-up for abnormal thyroid function tests. -Her previsit work-up confirms subclinical hyperthyroidism.  Her thyroid uptake and scan was unremarkable for 13% 24-hour uptake (normal 10- 30%)  -She presents with symptoms including fatigue, nausea, and on and off palpitations.    -She has fluctuant and body weight.  She is currently dealing with unexplained anxiety, sleep disturbance.   -She is on minimal dose of metoprolol 25 mg p.o. daily, and believes that this medication is causing her symptoms.    She is status post partial hysterectomy for symptomatic fibroids.  - She denies any family history of thyroid dysfunction. She denies any personal history of goiter. She is not on any thyroid hormone supplement.  Review of Systems Constitutional:  + Minimally fluctuating body weight,  + fatigue , no subjective hyperthermia nor hypothymia.   Eyes: no blurry vision, no xerophthalmia ENT: no sore throat, no nodules palpated in  throat, no dysphagia/odynophagia, no hoarseness Cardiovascular: Chest pain, no palpitations. Respiratory: no cough/SOB Gastrointestinal: + Nausea, no diarrhea, no abdominal pain.   Skin: no rashes Neurological: No tremors, no tingling.   Psychiatric: no depression/anxiety  Objective:    BP 125/86   Pulse 78   Ht 5\' 4"  (1.626 m)   Wt 222 lb (100.7 kg)   LMP 10/09/2014   BMI 38.11 kg/m   Wt Readings from Last 3 Encounters:  02/08/18 222 lb (100.7 kg)  02/04/18 235 lb (106.6 kg)  01/26/18 225 lb (102.1 kg)    Physical Exam  Constitutional: + Obese, not in acute distress, normal  state of mind.    Eyes: PERRLA, EOMI, no exophthalmos ENT: moist mucous membranes, no thyromegaly, no cervical lymphadenopathy Musculoskeletal: no deformities, strength intact in all 4 Skin: moist, + tattoo, warm, no rashes Neurological: No tremors of outstretched hands    CMP     Component Value Date/Time   NA 139 02/06/2018 1926   K 3.6 02/06/2018 1926   CL 104 02/06/2018 1926   CO2 25 02/06/2018 1926   GLUCOSE 117 (H) 02/06/2018 1926   BUN 9 02/06/2018 1926   CREATININE 0.61 02/06/2018 1926   CALCIUM 9.6 02/06/2018 1926   PROT 8.0 02/06/2018 1926   ALBUMIN 4.0 02/06/2018 1926   AST 22 02/06/2018 1926   ALT 25 02/06/2018 1926   ALKPHOS 56 02/06/2018 1926   BILITOT 0.5 02/06/2018 1926   GFRNONAA >60 02/06/2018 1926   GFRAA >60 02/06/2018 1926    Results for Heather Fry, Heather Fry (MRN [id number]) as of 02/08/2018 17:34  Ref. Range 01/20/2018 14:01  TSH Latest Units: mIU/L 0.01 (L)  Triiodothyronine,Free,Serum Latest Ref Range: 2.3 - 4.2 pg/mL 4.2  T4,Free(Direct) Latest Ref Range: 0.8 - 1.8 ng/dL 1.2   EKG was normal sinus -Thyroid uptake and scan unremarkable on Feb 03, 2018.  Assessment & Plan:   1.  Subclinical hyperthyroidism  -Her symptoms for which she is visits ER as well as this clinic before scheduled appointments are out of proportion for the degree of thyroid dysfunction she  has.   -As confirmed by nuclear medicine study, her thyroid is not very active to warrant ablative treatment at this time. -I explained to her that a low-dose Tapazole may bring the TSH back to normal as well as keeping her free T4 within range, however not expected to alleviate her symptom complexes which are nonspecific and not due to thyroid dysfunction.    -I discussed and initiated Tapazole 5 mg p.o. daily with breakfast with plan to repeat thyroid function test in 3 months.    -Given her stable pulse rate between 70-84, and given the fact that she thinks metoprolol is causing her problems, I advised her to even discontinue metoprolol for observation.   -She will call back if her pulse rate registers above 100.    -She appears to have unexplained anxiety/panic disorder, likely made worse by the fact that she does not sleep well.  She may benefit from evaluation by psych.   -She does not have clinical goiter hence no need for  Other thyroid  imaging studies.  - I advised patient to maintain close follow up with Redmond School, MD for primary care needs.  Follow up plan: Return in about 3 months (around 05/11/2018) for follow up with pre-visit labs.  Glade Lloyd, MD Phone: [phone]  Fax: [phone]   -  This note was partially dictated with voice recognition software. Similar sounding words can be transcribed inadequately or may not  be corrected upon review.  02/08/2018, 5:33 PM

## 2018-02-09 ENCOUNTER — Ambulatory Visit: Payer: BLUE CROSS/BLUE SHIELD | Admitting: "Endocrinology

## 2018-02-12 ENCOUNTER — Encounter (HOSPITAL_COMMUNITY): Payer: Self-pay | Admitting: Emergency Medicine

## 2018-02-12 DIAGNOSIS — Z79899 Other long term (current) drug therapy: Secondary | ICD-10-CM | POA: Diagnosis not present

## 2018-02-12 DIAGNOSIS — R002 Palpitations: Secondary | ICD-10-CM | POA: Diagnosis not present

## 2018-02-12 DIAGNOSIS — Z87891 Personal history of nicotine dependence: Secondary | ICD-10-CM | POA: Diagnosis not present

## 2018-02-12 DIAGNOSIS — E079 Disorder of thyroid, unspecified: Secondary | ICD-10-CM | POA: Insufficient documentation

## 2018-02-12 DIAGNOSIS — R0682 Tachypnea, not elsewhere classified: Secondary | ICD-10-CM | POA: Diagnosis not present

## 2018-02-12 DIAGNOSIS — E0590 Thyrotoxicosis, unspecified without thyrotoxic crisis or storm: Secondary | ICD-10-CM | POA: Insufficient documentation

## 2018-02-12 DIAGNOSIS — R000 Tachycardia, unspecified: Secondary | ICD-10-CM | POA: Diagnosis not present

## 2018-02-12 DIAGNOSIS — I10 Essential (primary) hypertension: Secondary | ICD-10-CM | POA: Diagnosis not present

## 2018-02-12 DIAGNOSIS — R109 Unspecified abdominal pain: Secondary | ICD-10-CM | POA: Diagnosis not present

## 2018-02-12 NOTE — ED Triage Notes (Signed)
Third visit in 3 weeks  Pt reports Dr Fusco/Nyland/other one of her doctors changed her metoprolol and one physician wanted ehr to stop the med  Now she reports her BP and pulse are going up   She reports she saw the endocrinologist who tried to put her on a different med  Pt reports something is wrong and noone can find out what it is

## 2018-02-13 ENCOUNTER — Emergency Department (HOSPITAL_COMMUNITY)
Admission: EM | Admit: 2018-02-13 | Discharge: 2018-02-13 | Disposition: A | Discharge status: Home / Self Care | Payer: BLUE CROSS/BLUE SHIELD | Attending: Emergency Medicine | Admitting: Emergency Medicine

## 2018-02-13 DIAGNOSIS — E0590 Thyrotoxicosis, unspecified without thyrotoxic crisis or storm: Secondary | ICD-10-CM

## 2018-02-13 LAB — URINALYSIS, ROUTINE W REFLEX MICROSCOPIC
Bilirubin Urine: NEGATIVE
Glucose, UA: NEGATIVE mg/dL
Hgb urine dipstick: NEGATIVE
KETONES UR: NEGATIVE mg/dL
LEUKOCYTES UA: NEGATIVE
NITRITE: NEGATIVE
Protein, ur: NEGATIVE mg/dL
SPECIFIC GRAVITY, URINE: 1.027 (ref 1.005–1.030)
pH: 5 (ref 5.0–8.0)

## 2018-02-13 NOTE — Discharge Instructions (Signed)
Reduce the Lopressor dose to 1/2 tablet (12.5 mg) twice a day.  You also can use it as needed, only taking it if you have elevated heart rate or blood pressure.

## 2018-02-13 NOTE — ED Provider Notes (Signed)
[facility] EMERGENCY DEPARTMENT Provider Note   CSN: [id number] Arrival date & time: 02/12/18  2243     History   Chief Complaint Chief Complaint  Patient presents with  . Hypertension    HPI Heather Fry is a 42 y.o. female.  Patient presents to the ER for evaluation of elevated blood pressure.  Patient reports that she has been experiencing intermittent episodes of rapid heart rate and elevated blood pressure recently.  She has been on Lopressor for some time, but had been experiencing some low heart rates and low blood pressure, so she was told to stop the Lopressor.  She also has been told that she has mild hyperthyroidism, is currently under the care of an endocrinologist.  She saw her endocrinologist this week and was told that her elevated blood pressure and elevated heart rates were not related to her thyroid.  The endocrinologist was, however, recommending treatment for her thyroid.  Patient reported that she started having pain in her right flank that she thought might be her kidney tonight.  No urinary symptoms.  No fever, nausea, vomiting.  She has taken her heart rate and blood pressure multiple times tonight and it was persistently elevated so she presented to the ER for evaluation.     Past Medical History:  Diagnosis Date  . Anxiety   . Depression   . Diverticulosis   . GERD (gastroesophageal reflux disease)   . Internal hemorrhoids   . Thyroid disease   . Uterine fibroid     Patient Active Problem List   Diagnosis Date Noted  . Subclinical hyperthyroidism 02/08/2018  . GERD (gastroesophageal reflux disease) 01/28/2017  . Rectal bleeding 01/28/2017  . 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 Gastric polyp   . RUQ abdominal pain 09/06/2014  . Excessive or frequent menstruation 01/13/2013  . Depression     Past Surgical History:  Procedure Laterality Date  . ABDOMINAL HYSTERECTOMY     Per patient, uterine fibroids.  . ABDOMINAL HYSTERECTOMY    .  CHOLECYSTECTOMY N/A 11/06/2014   Procedure: LAPAROSCOPIC CHOLECYSTECTOMY;  Surgeon: Jamesetta So, MD;  Location: [facility];  Service: General;  Laterality: N/A;  . COLONOSCOPY N/A 03/04/2017   Procedure: COLONOSCOPY;  Surgeon: Daneil Dolin, MD;  Location: [facility];  Service: Endoscopy;  Laterality: N/A;  8:30am  . DILATION AND CURETTAGE OF UTERUS    . ESOPHAGOGASTRODUODENOSCOPY N/A 10/02/2014   Procedure: ESOPHAGOGASTRODUODENOSCOPY (EGD);  Surgeon: Daneil Dolin, MD;  Location: [facility];  Service: Endoscopy;  Laterality: N/A;  945am - moved to 8:45 - Ginger notified pt  . MOUTH SURGERY     extraction of teeth  . POLYPECTOMY  03/04/2017   Procedure: POLYPECTOMY;  Surgeon: Daneil Dolin, MD;  Location: [facility];  Service: Endoscopy;;  colon  . TUBAL LIGATION       OB History    Gravida  2   Para  2   Term  2   Preterm      AB      Living  2     SAB      TAB      Ectopic      Multiple      Live Births               Home Medications    Prior to Admission medications   Medication Sig Start Date End Date Taking? Authorizing Provider  cholecalciferol (VITAMIN D) 1000 units tablet Take 1 tablet (  1,000 Units total) by mouth daily. 01/27/18  Yes Cassandria Anger, MD  diphenhydrAMINE (BENADRYL) 25 mg capsule Take 50 mg by mouth at bedtime as needed for allergies or sleep.   Yes [provider]  escitalopram (LEXAPRO) 20 MG tablet Take 20 mg by mouth at bedtime.    Yes [provider]  metoprolol tartrate (LOPRESSOR) 25 MG tablet Take 1 tablet (25 mg total) by mouth 2 (two) times daily. 01/26/18  Yes Nida, Marella Chimes, MD  Multiple Vitamin (MULTIVITAMIN WITH MINERALS) TABS Take 1 tablet by mouth daily.   Yes [provider]  Omega-3 Fatty Acids (FISH OIL) 1200 MG CAPS Take 1 capsule by mouth at bedtime.    Yes [provider]  Omeprazole 20 MG TBEC Take 1 tablet (20 mg total) by mouth daily before breakfast. 01/28/17  Yes  Mahala Menghini, PA-C  simethicone (PHAZYME) 125 MG chewable tablet Chew 125 mg by mouth every 6 (six) hours as needed for flatulence.   Yes [provider]  Wheat Dextrin (BENEFIBER DRINK MIX PO) Take 1 Package by mouth daily.    Yes [provider]  famotidine (PEPCID) 20 MG tablet Take 1 tablet (20 mg total) by mouth 2 (two) times daily. Patient not taking: Reported on 02/12/2018 02/06/18   Francine Graven, DO  methimazole (TAPAZOLE) 5 MG tablet Take 1 tablet (5 mg total) by mouth daily. 02/08/18   Cassandria Anger, MD  metoCLOPramide (REGLAN) 10 MG tablet Take 1 tablet (10 mg total) by mouth every 6 (six) hours as needed for nausea or vomiting. Patient not taking: Reported on 02/12/2018 02/06/18   Francine Graven, DO    Family History Family History  Problem Relation Age of Onset  . Hypertension Father   . Diabetes Father   . Depression Other   . Uterine cancer Mother        ?cervical cancer  . Uterine cancer Sister        suicide  . Colon cancer Paternal Aunt 5    Social History Social History   Tobacco Use  . Smoking status: Former Smoker    Packs/day: 1.00    Years: 3.00    Pack years: 3.00    Types: Cigarettes    Last attempt to quit: 11/03/1997    Years since quitting: 20.2  . Smokeless tobacco: Never Used  Substance Use Topics  . Alcohol use: No    Alcohol/week: 0.0 oz  . Drug use: No     Allergies   Levofloxacin; Lisinopril; and Penicillins   Review of Systems Review of Systems  Cardiovascular: Positive for palpitations.  Genitourinary: Positive for flank pain.  All other systems reviewed and are negative.    Physical Exam Updated Vital Signs BP 135/87   Pulse 79   Temp 98.2 F (36.8 C) (Oral)   Resp 17   Ht 5\' 4"  (1.626 m)   Wt 100.7 kg (222 lb)   LMP 10/09/2014   SpO2 98%   BMI 38.11 kg/m   Physical Exam  Constitutional: She is oriented to person, place, and time. She appears well-developed and well-nourished. No  distress.  HENT:  Head: Normocephalic and atraumatic.  Right Ear: Hearing normal.  Left Ear: Hearing normal.  Nose: Nose normal.  Mouth/Throat: Oropharynx is clear and moist and mucous membranes are normal.  Eyes: Pupils are equal, round, and reactive to light. Conjunctivae and EOM are normal.  Neck: Normal range of motion. Neck supple.  Cardiovascular: Regular rhythm, S1 normal  and S2 normal. Exam reveals no gallop and no friction rub.  No murmur heard. Pulmonary/Chest: Effort normal and breath sounds normal. No respiratory distress. She exhibits no tenderness.  Abdominal: Soft. Normal appearance and bowel sounds are normal. There is no hepatosplenomegaly. There is no tenderness. There is no rebound, no guarding, no tenderness at McBurney's point and negative Murphy's sign. No hernia.  Musculoskeletal: Normal range of motion.  Neurological: She is alert and oriented to person, place, and time. She has normal strength. No cranial nerve deficit or sensory deficit. Coordination normal. GCS eye subscore is 4. GCS verbal subscore is 5. GCS motor subscore is 6.  Skin: Skin is warm, dry and intact. No rash noted. No cyanosis.  Psychiatric: Her speech is normal and behavior is normal. Thought content normal. Her mood appears anxious.  Nursing note and vitals reviewed.    ED Treatments / Results  Labs (all labs ordered are listed, but only abnormal results are displayed) Labs Reviewed  URINALYSIS, ROUTINE W REFLEX MICROSCOPIC - Abnormal; Notable for the following components:      Result Value   APPearance HAZY (*)    All other components within normal limits    EKG EKG Interpretation  Date/Time:  Saturday Feb 13 2018 02:03:35 EDT Ventricular Rate:  77 PR Interval:    QRS Duration: 80 QT Interval:  414 QTC Calculation: 469 R Axis:   50 Text Interpretation:  Sinus rhythm Low voltage, precordial leads Confirmed by Orpah Greek [id number]) on 02/13/2018 2:12:34 AM   Radiology No  results found.  Procedures Procedures (including critical care time)  Medications Ordered in ED Medications - No data to display   Initial Impression / Assessment and Plan / ED Course  I have reviewed the triage vital signs and the nursing notes.  Pertinent labs & imaging results that were available during my care of the patient were reviewed by me and considered in my medical decision making (see chart for details).     Patient appears anxious.  She seems to be focused on her blood pressure and likely has been experiencing increased heart rate and blood pressure secondary to anxiety over her thyroid condition and her vital signs.  I recommended she check her vitals less often.  I cannot, however, rule out some of the symptoms being secondary to her hyperthyroidism.  She does not have any physiology right now that would suggest thyroid storm.  She was told by her endocrinologist this week that her TSH was only mildly decreased.  I have therefore recommended that she continue low-dose beta-blocker as this could help with some of the symptoms until she can get back in touch with her endocrinologist and determine what the treatment course is going to be.  Final Clinical Impressions(s) / ED Diagnoses   Final diagnoses:  Hyperthyroidism    ED Discharge Orders    None       Orpah Greek, MD 02/13/18 [phone]

## 2018-02-23 ENCOUNTER — Encounter: Payer: Self-pay | Admitting: Gastroenterology

## 2018-02-23 ENCOUNTER — Telehealth: Payer: Self-pay | Admitting: Gastroenterology

## 2018-02-23 ENCOUNTER — Ambulatory Visit: Payer: BLUE CROSS/BLUE SHIELD | Admitting: Gastroenterology

## 2018-02-23 NOTE — Telephone Encounter (Signed)
Patient was a no show and letter sent  °

## 2018-02-26 DIAGNOSIS — F064 Anxiety disorder due to known physiological condition: Secondary | ICD-10-CM | POA: Diagnosis not present

## 2018-02-26 DIAGNOSIS — F422 Mixed obsessional thoughts and acts: Secondary | ICD-10-CM | POA: Diagnosis not present

## 2018-03-08 DIAGNOSIS — F064 Anxiety disorder due to known physiological condition: Secondary | ICD-10-CM | POA: Diagnosis not present

## 2018-03-08 DIAGNOSIS — F422 Mixed obsessional thoughts and acts: Secondary | ICD-10-CM | POA: Diagnosis not present

## 2018-03-19 DIAGNOSIS — F419 Anxiety disorder, unspecified: Secondary | ICD-10-CM | POA: Diagnosis not present

## 2018-03-19 DIAGNOSIS — R0789 Other chest pain: Secondary | ICD-10-CM | POA: Diagnosis not present

## 2018-03-19 DIAGNOSIS — R079 Chest pain, unspecified: Secondary | ICD-10-CM | POA: Diagnosis not present

## 2018-03-19 DIAGNOSIS — F17200 Nicotine dependence, unspecified, uncomplicated: Secondary | ICD-10-CM | POA: Diagnosis not present

## 2018-03-19 DIAGNOSIS — E0590 Thyrotoxicosis, unspecified without thyrotoxic crisis or storm: Secondary | ICD-10-CM | POA: Diagnosis not present

## 2018-03-22 ENCOUNTER — Ambulatory Visit (INDEPENDENT_AMBULATORY_CARE_PROVIDER_SITE_OTHER): Payer: BLUE CROSS/BLUE SHIELD | Admitting: "Endocrinology

## 2018-03-22 ENCOUNTER — Encounter: Payer: Self-pay | Admitting: "Endocrinology

## 2018-03-22 VITALS — BP 148/86 | HR 80 | Ht 64.0 in | Wt 224.0 lb

## 2018-03-22 DIAGNOSIS — E0590 Thyrotoxicosis, unspecified without thyrotoxic crisis or storm: Secondary | ICD-10-CM | POA: Diagnosis not present

## 2018-03-22 NOTE — Progress Notes (Signed)
Subjective:    Patient ID: Heather Fry, female    DOB: 1975/11/28, PCP Redmond School, MD   Past Medical History:  Diagnosis Date  . Anxiety   . Depression   . Diverticulosis   . GERD (gastroesophageal reflux disease)   . Internal hemorrhoids   . Thyroid disease   . Uterine fibroid    Past Surgical History:  Procedure Laterality Date  . ABDOMINAL HYSTERECTOMY     Per patient, uterine fibroids.  . ABDOMINAL HYSTERECTOMY    . CHOLECYSTECTOMY N/A 11/06/2014   Procedure: LAPAROSCOPIC CHOLECYSTECTOMY;  Surgeon: Jamesetta So, MD;  Location: [facility];  Service: General;  Laterality: N/A;  . COLONOSCOPY N/A 03/04/2017   Procedure: COLONOSCOPY;  Surgeon: Daneil Dolin, MD;  Location: [facility];  Service: Endoscopy;  Laterality: N/A;  8:30am  . DILATION AND CURETTAGE OF UTERUS    . ESOPHAGOGASTRODUODENOSCOPY N/A 10/02/2014   Procedure: ESOPHAGOGASTRODUODENOSCOPY (EGD);  Surgeon: Daneil Dolin, MD;  Location: [facility];  Service: Endoscopy;  Laterality: N/A;  945am - moved to 8:45 - Ginger notified pt  . MOUTH SURGERY     extraction of teeth  . POLYPECTOMY  03/04/2017   Procedure: POLYPECTOMY;  Surgeon: Daneil Dolin, MD;  Location: [facility];  Service: Endoscopy;;  colon  . TUBAL LIGATION     Social History   Socioeconomic History  . Marital status: Married    Spouse name: Not on file  . Number of children: Not on file  . Years of education: Not on file  . Highest education level: Not on file  Occupational History  . Not on file  Social Needs  . Financial resource strain: Not on file  . Food insecurity:    Worry: Not on file    Inability: Not on file  . Transportation needs:    Medical: Not on file    Non-medical: Not on file  Tobacco Use  . Smoking status: Former Smoker    Packs/day: 1.00    Years: 3.00    Pack years: 3.00    Types: Cigarettes    Last attempt to quit: 11/03/1997    Years since quitting: 20.3  . Smokeless tobacco: Never Used   Substance and Sexual Activity  . Alcohol use: No    Alcohol/week: 0.0 oz  . Drug use: No  . Sexual activity: Yes    Birth control/protection: Surgical  Lifestyle  . Physical activity:    Days per week: Not on file    Minutes per session: Not on file  . Stress: Not on file  Relationships  . Social connections:    Talks on phone: Not on file    Gets together: Not on file    Attends religious service: Not on file    Active member of club or organization: Not on file    Attends meetings of clubs or organizations: Not on file    Relationship status: Not on file  Other Topics Concern  . Not on file  Social History Narrative  . Not on file   Outpatient Encounter Medications as of 03/22/2018  Medication Sig  . cholecalciferol (VITAMIN D) 1000 units tablet Take 1 tablet (1,000 Units total) by mouth daily.  . diphenhydrAMINE (BENADRYL) 25 mg capsule Take 50 mg by mouth at bedtime as needed for allergies or sleep.  Marland Kitchen escitalopram (LEXAPRO) 20 MG tablet Take 20 mg by mouth at bedtime.   . methimazole (TAPAZOLE) 5 MG tablet Take 1  tablet (5 mg total) by mouth daily. (Patient not taking: Reported on 03/22/2018)  . metoprolol tartrate (LOPRESSOR) 25 MG tablet Take 1 tablet (25 mg total) by mouth 2 (two) times daily. (Patient taking differently: Take 25 mg by mouth as needed. )  . Multiple Vitamin (MULTIVITAMIN WITH MINERALS) TABS Take 1 tablet by mouth daily.  . Omega-3 Fatty Acids (FISH OIL) 1200 MG CAPS Take 1 capsule by mouth at bedtime.   . Omeprazole 20 MG TBEC Take 1 tablet (20 mg total) by mouth daily before breakfast.  . simethicone (PHAZYME) 125 MG chewable tablet Chew 125 mg by mouth every 6 (six) hours as needed for flatulence.  . Wheat Dextrin (BENEFIBER DRINK MIX PO) Take 1 Package by mouth daily.   . [DISCONTINUED] famotidine (PEPCID) 20 MG tablet Take 1 tablet (20 mg total) by mouth 2 (two) times daily. (Patient not taking: Reported on 02/12/2018)  . [DISCONTINUED] metoCLOPramide  (REGLAN) 10 MG tablet Take 1 tablet (10 mg total) by mouth every 6 (six) hours as needed for nausea or vomiting. (Patient not taking: Reported on 02/12/2018)   No facility-administered encounter medications on file as of 03/22/2018.    ALLERGIES: Allergies  Allergen Reactions  . Levofloxacin Other (See Comments)    Pt can not have due to taking Lexapro   . Lisinopril Swelling  . Penicillins Other (See Comments)    Loopy Has patient had a PCN reaction causing immediate rash, facial/tongue/throat swelling, SOB or lightheadedness with hypotension: No Has patient had a PCN reaction causing severe rash involving mucus membranes or skin necrosis: No Has patient had a PCN reaction that required hospitalization: No Has patient had a PCN reaction occurring within the last 10 years: No If all of the above answers are "NO", then may proceed with Cephalosporin use.  "knocked out.     VACCINATION STATUS:  There is no immunization history on file for this patient.  HPI 42 year old female patient with medical history as above. She is being seen in follow-up for abnormal thyroid function tests. -She was initiated on low-dose methimazole 5 mg for symptomatic subclinical hyperthyroidism during her last visit.  However, patient states that she has not started Tapazole yet.   -Her previsit thyroid function test showed significantly suppressed TSH at 0.02 along with normal free T4 of 0.97.    -Prior to  her last visit, her thyroid uptake and scan was unremarkable for 13% 24-hour uptake (normal 10- 30%)  -She presents with symptoms including fatigue, nausea, and on and off palpitations.    -She has fluctuanting body weight.  She is currently dealing with unexplained anxiety, sleep disturbance.   -She is on minimal dose of metoprolol 12.5 mg p.o. Daily.  She is status post partial hysterectomy for symptomatic fibroids.  - She denies any family history of thyroid dysfunction. She denies any personal  history of goiter. She is not on any thyroid hormone supplement.  Review of Systems Constitutional:  + Steady weight   + fatigue , no subjective hyperthermia nor hypothymia.   Eyes: no blurry vision, no xerophthalmia ENT: no sore throat, no nodules palpated in throat, no dysphagia/odynophagia, no hoarseness. Skin: no rashes Neurological: No tremors, no tingling.   Psychiatric: no depression/anxiety  Objective:    BP (!) 148/86   Pulse 80   Ht 5\' 4"  (1.626 m)   Wt 224 lb (101.6 kg)   LMP 10/09/2014   BMI 38.45 kg/m   Wt Readings from Last 3 Encounters:  03/22/18 224 lb (  101.6 kg)  02/12/18 222 lb (100.7 kg)  02/08/18 222 lb (100.7 kg)    Physical Exam  Constitutional: + Obese, not in acute distress, normal state of mind.    Eyes: PERRLA, EOMI, no exophthalmos ENT: moist mucous membranes, no thyromegaly, no cervical lymphadenopathy Musculoskeletal: no deformities, strength intact in all 4 Skin: moist, + tattoo, warm, no rashes Neurological: No tremors of outstretched hands    CMP     Component Value Date/Time   NA 139 02/06/2018 1926   K 3.6 02/06/2018 1926   CL 104 02/06/2018 1926   CO2 25 02/06/2018 1926   GLUCOSE 117 (H) 02/06/2018 1926   BUN 9 02/06/2018 1926   CREATININE 0.61 02/06/2018 1926   CALCIUM 9.6 02/06/2018 1926   PROT 8.0 02/06/2018 1926   ALBUMIN 4.0 02/06/2018 1926   AST 22 02/06/2018 1926   ALT 25 02/06/2018 1926   ALKPHOS 56 02/06/2018 1926   BILITOT 0.5 02/06/2018 1926   GFRNONAA >60 02/06/2018 1926   GFRAA >60 02/06/2018 1926   March 19, 2018 labs: TSH 0.02, free T4 0.97  EKG was normal sinus -Thyroid uptake and scan unremarkable on Feb 03, 2018.  Assessment & Plan:   1.  Subclinical hyperthyroidism  -Her presenting symptoms  are out of proportion for the degree of thyroid dysfunction she has.   -As confirmed by nuclear medicine study, her thyroid is not very active to warrant ablative treatment at this time. -I explained to her that  a low-dose Tapazole may bring the TSH back to normal as well as keeping her free T4 within range, however not expected to alleviate her symptom complexes which are nonspecific and not due to thyroid dysfunction.   -Interestingly, she has not started the Tapazole which was prescribed last visit.  -I advised her that is still the plan, reluctantly accepting Tapazole 5 mg p.o. daily with plan to repeat thyroid function tests in 8 weeks with office visit.     -Given her stable pulse rate between 70-84, and given the fact that she thinks metoprolol is causing her problems, I advised her to even discontinue metoprolol for observation, however she kept it at 12.5 mg p.o. daily.  Her pulse rate today is 80.   -She appears to have unexplained anxiety/panic disorder, likely made worse by the fact that she does not sleep well.  She may benefit from evaluation by psych.   -She does not have clinical goiter hence no need for  Other thyroid  imaging studies.  - I advised patient to maintain close follow up with Redmond School, MD for primary care needs.  Follow up plan: Return keep her last appointment, for follow up with pre-visit labs.  Glade Lloyd, MD Phone: [phone]  Fax: [phone]   -  This note was partially dictated with voice recognition software. Similar sounding words can be transcribed inadequately or may not  be corrected upon review.  03/22/2018, 1:05 PM

## 2018-05-08 DIAGNOSIS — R0689 Other abnormalities of breathing: Secondary | ICD-10-CM | POA: Diagnosis not present

## 2018-05-08 DIAGNOSIS — I10 Essential (primary) hypertension: Secondary | ICD-10-CM | POA: Diagnosis not present

## 2018-05-08 DIAGNOSIS — R457 State of emotional shock and stress, unspecified: Secondary | ICD-10-CM | POA: Diagnosis not present

## 2018-05-10 DIAGNOSIS — E0590 Thyrotoxicosis, unspecified without thyrotoxic crisis or storm: Secondary | ICD-10-CM | POA: Diagnosis not present

## 2018-05-11 LAB — T3, FREE: T3, Free: 3.4 pg/mL (ref 2.3–4.2)

## 2018-05-11 LAB — T4, FREE: Free T4: 0.9 ng/dL (ref 0.8–1.8)

## 2018-05-11 LAB — TSH: TSH: 0.02 mIU/L — ABNORMAL LOW

## 2018-05-13 ENCOUNTER — Ambulatory Visit (INDEPENDENT_AMBULATORY_CARE_PROVIDER_SITE_OTHER): Payer: BLUE CROSS/BLUE SHIELD | Admitting: "Endocrinology

## 2018-05-13 ENCOUNTER — Encounter: Payer: Self-pay | Admitting: "Endocrinology

## 2018-05-13 VITALS — BP 128/86 | HR 79 | Ht 64.0 in | Wt 229.0 lb

## 2018-05-13 DIAGNOSIS — E0590 Thyrotoxicosis, unspecified without thyrotoxic crisis or storm: Secondary | ICD-10-CM | POA: Diagnosis not present

## 2018-05-13 MED ORDER — METOPROLOL TARTRATE 25 MG PO TABS: 25.0000 mg | ORAL_TABLET | Freq: Every day | ORAL | 3 refills | Status: DC

## 2018-05-13 MED ORDER — METHIMAZOLE 5 MG PO TABS: 5.0000 mg | ORAL_TABLET | Freq: Every day | ORAL | 3 refills | Status: DC

## 2018-05-13 NOTE — Progress Notes (Signed)
Endocrinology follow-up note   Subjective:    Patient ID: Heather Fry, female    DOB: [birth year], PCP Redmond School, MD   Past Medical History:  Diagnosis Date  . Anxiety   . Depression   . Diverticulosis   . GERD (gastroesophageal reflux disease)   . Internal hemorrhoids   . Thyroid disease   . Uterine fibroid    Past Surgical History:  Procedure Laterality Date  . ABDOMINAL HYSTERECTOMY     Per patient, uterine fibroids.  . ABDOMINAL HYSTERECTOMY    . CHOLECYSTECTOMY N/A 11/06/2014   Procedure: LAPAROSCOPIC CHOLECYSTECTOMY;  Surgeon: Jamesetta So, MD;  Location: [facility];  Service: General;  Laterality: N/A;  . COLONOSCOPY N/A 03/04/2017   Procedure: COLONOSCOPY;  Surgeon: Daneil Dolin, MD;  Location: [facility];  Service: Endoscopy;  Laterality: N/A;  8:30am  . DILATION AND CURETTAGE OF UTERUS    . ESOPHAGOGASTRODUODENOSCOPY N/A 10/02/2014   Procedure: ESOPHAGOGASTRODUODENOSCOPY (EGD);  Surgeon: Daneil Dolin, MD;  Location: [facility];  Service: Endoscopy;  Laterality: N/A;  945am - moved to 8:45 - Ginger notified pt  . MOUTH SURGERY     extraction of teeth  . POLYPECTOMY  03/04/2017   Procedure: POLYPECTOMY;  Surgeon: Daneil Dolin, MD;  Location: [facility];  Service: Endoscopy;;  colon  . TUBAL LIGATION     Social History   Socioeconomic History  . Marital status: Married    Spouse name: Not on file  . Number of children: Not on file  . Years of education: Not on file  . Highest education level: Not on file  Occupational History  . Not on file  Social Needs  . Financial resource strain: Not on file  . Food insecurity:    Worry: Not on file    Inability: Not on file  . Transportation needs:    Medical: Not on file    Non-medical: Not on file  Tobacco Use  . Smoking status: Former Smoker    Packs/day: 1.00    Years: 3.00    Pack years: 3.00    Types: Cigarettes    Last attempt to quit: 11/03/1997    Years since quitting: 20.5  .  Smokeless tobacco: Never Used  Substance and Sexual Activity  . Alcohol use: No    Alcohol/week: 0.0 standard drinks  . Drug use: No  . Sexual activity: Yes    Birth control/protection: Surgical  Lifestyle  . Physical activity:    Days per week: Not on file    Minutes per session: Not on file  . Stress: Not on file  Relationships  . Social connections:    Talks on phone: Not on file    Gets together: Not on file    Attends religious service: Not on file    Active member of club or organization: Not on file    Attends meetings of clubs or organizations: Not on file    Relationship status: Not on file  Other Topics Concern  . Not on file  Social History Narrative  . Not on file   Outpatient Encounter Medications as of 05/13/2018  Medication Sig  . cholecalciferol (VITAMIN D) 1000 units tablet Take 1 tablet (1,000 Units total) by mouth daily.  . diphenhydrAMINE (BENADRYL) 25 mg capsule Take 50 mg by mouth at bedtime as needed for allergies or sleep.  Marland Kitchen escitalopram (LEXAPRO) 20 MG tablet Take 20 mg by mouth at bedtime.   . methimazole (TAPAZOLE)  5 MG tablet Take 1 tablet (5 mg total) by mouth daily.  . metoprolol tartrate (LOPRESSOR) 25 MG tablet Take 1 tablet (25 mg total) by mouth daily.  . Multiple Vitamin (MULTIVITAMIN WITH MINERALS) TABS Take 1 tablet by mouth daily.  . Omega-3 Fatty Acids (FISH OIL) 1200 MG CAPS Take 1 capsule by mouth at bedtime.   . Omeprazole 20 MG TBEC Take 1 tablet (20 mg total) by mouth daily before breakfast.  . simethicone (PHAZYME) 125 MG chewable tablet Chew 125 mg by mouth every 6 (six) hours as needed for flatulence.  . Wheat Dextrin (BENEFIBER DRINK MIX PO) Take 1 Package by mouth daily.   . [DISCONTINUED] methimazole (TAPAZOLE) 5 MG tablet Take 1 tablet (5 mg total) by mouth daily.  . [DISCONTINUED] metoprolol tartrate (LOPRESSOR) 25 MG tablet Take 1 tablet (25 mg total) by mouth 2 (two) times daily. (Patient taking differently: Take 12.5 mg by  mouth 2 (two) times daily. )   No facility-administered encounter medications on file as of 05/13/2018.    ALLERGIES: Allergies  Allergen Reactions  . Levofloxacin Other (See Comments)    Pt can not have due to taking Lexapro   . Lisinopril Swelling  . Penicillins Other (See Comments)    Loopy Has patient had a PCN reaction causing immediate rash, facial/tongue/throat swelling, SOB or lightheadedness with hypotension: No Has patient had a PCN reaction causing severe rash involving mucus membranes or skin necrosis: No Has patient had a PCN reaction that required hospitalization: No Has patient had a PCN reaction occurring within the last 10 years: No If all of the above answers are "NO", then may proceed with Cephalosporin use.  "knocked out.     VACCINATION STATUS:  There is no immunization history on file for this patient.  HPI 42 year old female patient with medical history as above. She is here with repeat thyroid function tests for follow-up of subclinical hyperthyroidism.   -Due to some subtle signs and symptom complexes she was initiated on low-dose methimazole 5 mg p.o. daily.   -She reports better consistency with this medication at this time. -Her previsit labs are slightly better towards target.  -Prior to  her last visit, her thyroid uptake and scan was unremarkable for 13% 24-hour uptake (normal 10- 30%)  -She still complains of  fatigue, nausea, and on and off palpitations, however she has not been consistent taking her metoprolol. -She has fluctuating body weight, history of unexplained anxiety and sleep disturbance.   She is status post partial hysterectomy for symptomatic fibroids.  - She denies any family history of thyroid dysfunction. She denies any personal history of goiter. She is not on any thyroid hormone supplement.  Review of Systems Constitutional:  + Progressive weight gain,  + fatigue , no subjective hyperthermia nor hypothymia.   Eyes: no blurry  vision, no xerophthalmia ENT: no sore throat, no nodules palpated in throat, no dysphagia/odynophagia, no hoarseness. Skin: no rashes Neurological: No tremors, no tingling.   Psychiatric: no depression, +anxiety  Objective:    BP 128/86   Pulse 79   Ht 5\' 4"  (1.626 m)   Wt 229 lb (103.9 kg)   LMP 10/09/2014   BMI 39.31 kg/m   Wt Readings from Last 3 Encounters:  05/13/18 229 lb (103.9 kg)  03/22/18 224 lb (101.6 kg)  02/12/18 222 lb (100.7 kg)    Physical Exam  Constitutional: + Obese, not in acute distress, normal state of mind.    Eyes: PERRLA, EOMI, no  exophthalmos ENT: moist mucous membranes, no thyromegaly, no cervical lymphadenopathy Musculoskeletal: no deformities, strength intact in all 4 Skin: moist, + tattoo, warm, no rashes Neurological: No tremors of outstretched hands    CMP     Component Value Date/Time   NA 139 02/06/2018 1926   K 3.6 02/06/2018 1926   CL 104 02/06/2018 1926   CO2 25 02/06/2018 1926   GLUCOSE 117 (H) 02/06/2018 1926   BUN 9 02/06/2018 1926   CREATININE 0.61 02/06/2018 1926   CALCIUM 9.6 02/06/2018 1926   PROT 8.0 02/06/2018 1926   ALBUMIN 4.0 02/06/2018 1926   AST 22 02/06/2018 1926   ALT 25 02/06/2018 1926   ALKPHOS 56 02/06/2018 1926   BILITOT 0.5 02/06/2018 1926   GFRNONAA >60 02/06/2018 1926   GFRAA >60 02/06/2018 1926   Recent Results (from the past 2160 hour(s))  Urinalysis, Routine w reflex microscopic     Status: Abnormal   Collection Time: 02/13/18  2:48 AM  Result Value Ref Range   Color, Urine YELLOW YELLOW   APPearance HAZY (A) CLEAR   Specific Gravity, Urine 1.027 1.005 - 1.030   pH 5.0 5.0 - 8.0   Glucose, UA NEGATIVE NEGATIVE mg/dL   Hgb urine dipstick NEGATIVE NEGATIVE   Bilirubin Urine NEGATIVE NEGATIVE   Ketones, ur NEGATIVE NEGATIVE mg/dL   Protein, ur NEGATIVE NEGATIVE mg/dL   Nitrite NEGATIVE NEGATIVE   Leukocytes, UA NEGATIVE NEGATIVE    Comment: Performed at [facility], [address]  [place], [place] [address]  T4, free     Status: None   Collection Time: 05/10/18  9:34 AM  Result Value Ref Range   Free T4 0.9 0.8 - 1.8 ng/dL  TSH     Status: Abnormal   Collection Time: 05/10/18  9:34 AM  Result Value Ref Range   TSH 0.02 (L) mIU/L    Comment:           Reference Range .           > or = 20 Years  0.40-4.50 .                Pregnancy Ranges           First trimester    0.26-2.66           Second trimester   0.55-2.73           Third trimester    0.43-2.91   T3, free     Status: None   Collection Time: 05/10/18  9:34 AM  Result Value Ref Range   T3, Free 3.4 2.3 - 4.2 pg/mL      EKG was normal sinus -Thyroid uptake and scan unremarkable on Feb 03, 2018.  Assessment & Plan:   1.  Subclinical hyperthyroidism  -Her presenting symptoms  are still out of proportion for the degree of thyroid dysfunction she has.   -Low dose of methimazole is helping to stabilize her thyroid function tests towards target. -She will be continued on the same, methimazole 5 mg p.o. Daily,  however not expected to alleviate her symptom complexes which are nonspecific and not due to thyroid dysfunction.    -She will continue to benefit from low-dose beta-blocker as well.  I advised her to continue metoprolol 25 mg p.o. Daily.   -She appears to have unexplained anxiety/panic disorder, likely made worse by the fact that she does not sleep well.  She may benefit from evaluation by psych.   -She does  not have clinical goiter hence no need for  Other thyroid  imaging studies. -Her prior thyroid uptake and scan was unremarkable at 13% in 24 hours.  - I advised patient to maintain close follow up with Redmond School, MD for primary care needs.  Follow up plan: Return in about 3 months (around 08/13/2018) for Follow up with Pre-visit Labs.  Glade Lloyd, MD Phone: [phone]  Fax: [phone]   -  This note was partially dictated with voice recognition software. Similar sounding words  can be transcribed inadequately or may not  be corrected upon review.  05/13/2018, 11:23 AM

## 2018-05-28 DIAGNOSIS — K219 Gastro-esophageal reflux disease without esophagitis: Secondary | ICD-10-CM | POA: Diagnosis not present

## 2018-05-28 DIAGNOSIS — F17200 Nicotine dependence, unspecified, uncomplicated: Secondary | ICD-10-CM | POA: Diagnosis not present

## 2018-05-28 DIAGNOSIS — M25511 Pain in right shoulder: Secondary | ICD-10-CM | POA: Diagnosis not present

## 2018-05-28 DIAGNOSIS — E0590 Thyrotoxicosis, unspecified without thyrotoxic crisis or storm: Secondary | ICD-10-CM | POA: Diagnosis not present

## 2018-05-28 DIAGNOSIS — R0789 Other chest pain: Secondary | ICD-10-CM | POA: Diagnosis not present

## 2018-05-28 DIAGNOSIS — R42 Dizziness and giddiness: Secondary | ICD-10-CM | POA: Diagnosis not present

## 2018-05-28 DIAGNOSIS — N951 Menopausal and female climacteric states: Secondary | ICD-10-CM | POA: Diagnosis not present

## 2018-05-28 DIAGNOSIS — R110 Nausea: Secondary | ICD-10-CM | POA: Diagnosis not present

## 2018-05-28 DIAGNOSIS — Z79899 Other long term (current) drug therapy: Secondary | ICD-10-CM | POA: Diagnosis not present

## 2018-06-01 DIAGNOSIS — Z6841 Body Mass Index (BMI) 40.0 and over, adult: Secondary | ICD-10-CM | POA: Diagnosis not present

## 2018-06-01 DIAGNOSIS — E0590 Thyrotoxicosis, unspecified without thyrotoxic crisis or storm: Secondary | ICD-10-CM | POA: Diagnosis not present

## 2018-06-01 DIAGNOSIS — K219 Gastro-esophageal reflux disease without esophagitis: Secondary | ICD-10-CM | POA: Diagnosis not present

## 2018-06-01 DIAGNOSIS — Z1389 Encounter for screening for other disorder: Secondary | ICD-10-CM | POA: Diagnosis not present

## 2018-06-01 DIAGNOSIS — I10 Essential (primary) hypertension: Secondary | ICD-10-CM | POA: Diagnosis not present

## 2018-06-01 DIAGNOSIS — Z0000 Encounter for general adult medical examination without abnormal findings: Secondary | ICD-10-CM | POA: Diagnosis not present

## 2018-06-01 DIAGNOSIS — E669 Obesity, unspecified: Secondary | ICD-10-CM | POA: Diagnosis not present

## 2018-06-01 DIAGNOSIS — Z0001 Encounter for general adult medical examination with abnormal findings: Secondary | ICD-10-CM | POA: Diagnosis not present

## 2018-06-22 DIAGNOSIS — R921 Mammographic calcification found on diagnostic imaging of breast: Secondary | ICD-10-CM | POA: Diagnosis not present

## 2018-06-22 DIAGNOSIS — Z1231 Encounter for screening mammogram for malignant neoplasm of breast: Secondary | ICD-10-CM | POA: Diagnosis not present

## 2018-07-01 DIAGNOSIS — F17200 Nicotine dependence, unspecified, uncomplicated: Secondary | ICD-10-CM | POA: Diagnosis not present

## 2018-07-01 DIAGNOSIS — E0590 Thyrotoxicosis, unspecified without thyrotoxic crisis or storm: Secondary | ICD-10-CM | POA: Diagnosis not present

## 2018-07-01 DIAGNOSIS — Z79899 Other long term (current) drug therapy: Secondary | ICD-10-CM | POA: Diagnosis not present

## 2018-07-01 DIAGNOSIS — M6281 Muscle weakness (generalized): Secondary | ICD-10-CM | POA: Diagnosis not present

## 2018-07-01 DIAGNOSIS — K219 Gastro-esophageal reflux disease without esophagitis: Secondary | ICD-10-CM | POA: Diagnosis not present

## 2018-07-11 DIAGNOSIS — T447X5A Adverse effect of beta-adrenoreceptor antagonists, initial encounter: Secondary | ICD-10-CM | POA: Diagnosis not present

## 2018-07-11 DIAGNOSIS — R42 Dizziness and giddiness: Secondary | ICD-10-CM | POA: Diagnosis not present

## 2018-07-11 DIAGNOSIS — E0590 Thyrotoxicosis, unspecified without thyrotoxic crisis or storm: Secondary | ICD-10-CM | POA: Diagnosis not present

## 2018-07-11 DIAGNOSIS — F17200 Nicotine dependence, unspecified, uncomplicated: Secondary | ICD-10-CM | POA: Diagnosis not present

## 2018-07-11 DIAGNOSIS — Z79899 Other long term (current) drug therapy: Secondary | ICD-10-CM | POA: Diagnosis not present

## 2018-07-11 DIAGNOSIS — T50905A Adverse effect of unspecified drugs, medicaments and biological substances, initial encounter: Secondary | ICD-10-CM | POA: Diagnosis not present

## 2018-07-11 DIAGNOSIS — K219 Gastro-esophageal reflux disease without esophagitis: Secondary | ICD-10-CM | POA: Diagnosis not present

## 2018-07-14 ENCOUNTER — Other Ambulatory Visit: Payer: Self-pay | Admitting: Internal Medicine

## 2018-07-14 DIAGNOSIS — R921 Mammographic calcification found on diagnostic imaging of breast: Secondary | ICD-10-CM

## 2018-07-14 DIAGNOSIS — R928 Other abnormal and inconclusive findings on diagnostic imaging of breast: Secondary | ICD-10-CM | POA: Diagnosis not present

## 2018-07-23 ENCOUNTER — Ambulatory Visit (INDEPENDENT_AMBULATORY_CARE_PROVIDER_SITE_OTHER): Payer: BLUE CROSS/BLUE SHIELD | Admitting: Endocrinology

## 2018-07-23 ENCOUNTER — Encounter: Payer: Self-pay | Admitting: Endocrinology

## 2018-07-23 DIAGNOSIS — E0590 Thyrotoxicosis, unspecified without thyrotoxic crisis or storm: Secondary | ICD-10-CM

## 2018-07-23 MED ORDER — ESCITALOPRAM OXALATE 20 MG PO TABS: 20.0000 mg | ORAL_TABLET | Freq: Every day | ORAL | 5 refills | Status: DC

## 2018-07-23 MED ORDER — METOPROLOL TARTRATE 25 MG PO TABS
12.5000 mg | ORAL_TABLET | Freq: Every day | ORAL | 3 refills | Status: DC
Start: 2018-07-23 — End: 2018-11-09

## 2018-07-23 NOTE — Progress Notes (Signed)
Subjective:    Patient ID: Heather Fry, female    DOB: 12-30-1975, 42 y.o.   MRN: [id number]  HPI Pt is referred by Dr Gerarda Fraction, for hyperthyroidism.  Pt reports she was dx'ed with hyperthyroidism in 2017.  she was rx'ed with methimazole.  she has never had XRT to the anterior neck, or thyroid surgery.  she does not consume kelp or any other non-prescribed thyroid medication.  she has never been on amiodarone.  She has slight palpitations in the chest, and assoc tremor.  She was also rx'ed metoprolol, but she can only tolerate 12.5 mg PRN (at 25, she gets low BP and HR).  Pt says TSH at [place] [facility] was low last week.  She has had TAH.   Past Medical History:  Diagnosis Date  . Anxiety   . Depression   . Diverticulosis   . GERD (gastroesophageal reflux disease)   . Internal hemorrhoids   . Thyroid disease   . Uterine fibroid     Past Surgical History:  Procedure Laterality Date  . ABDOMINAL HYSTERECTOMY     Per patient, uterine fibroids.  . ABDOMINAL HYSTERECTOMY    . CHOLECYSTECTOMY N/A 11/06/2014   Procedure: LAPAROSCOPIC CHOLECYSTECTOMY;  Surgeon: Jamesetta So, MD;  Location: [facility];  Service: General;  Laterality: N/A;  . COLONOSCOPY N/A 03/04/2017   Procedure: COLONOSCOPY;  Surgeon: Daneil Dolin, MD;  [facility];  Service: Endoscopy;  Laterality: N/A;  8:30am  . DILATION AND CURETTAGE OF UTERUS    . ESOPHAGOGASTRODUODENOSCOPY N/A 10/02/2014   Procedure: ESOPHAGOGASTRODUODENOSCOPY (EGD);  Surgeon: Daneil Dolin, MD;  [facility];  Service: Endoscopy;  Laterality: N/A;  945am - moved to 8:45 - Ginger notified pt  . MOUTH SURGERY     extraction of teeth  . POLYPECTOMY  03/04/2017   Procedure: POLYPECTOMY;  Surgeon: Daneil Dolin, MD;  [facility];  Service: Endoscopy;;  colon  . TUBAL LIGATION      Social History   Socioeconomic History  . Marital status: Married    Spouse name: Not on file  . Number of children: Not on file  .  Years of education: Not on file  . Highest education level: Not on file  Occupational History  . Not on file  Social Needs  . Financial resource strain: Not on file  . Food insecurity:    Worry: Not on file    Inability: Not on file  . Transportation needs:    Medical: Not on file    Non-medical: Not on file  Tobacco Use  . Smoking status: Current Every Day Smoker    Packs/day: 0.50    Years: 3.00    Pack years: 1.50    Types: Cigarettes    Last attempt to quit: 11/03/1997    Years since quitting: 20.7  . Smokeless tobacco: Never Used  . Tobacco comment: 1 pack 1 week  Substance and Sexual Activity  . Alcohol use: No    Alcohol/week: 0.0 standard drinks  . Drug use: No  . Sexual activity: Yes    Birth control/protection: Surgical  Lifestyle  . Physical activity:    Days per week: Not on file    Minutes per session: Not on file  . Stress: Not on file  Relationships  . Social connections:    Talks on phone: Not on file    Gets together: Not on file    Attends religious service: Not on file  Active member of club or organization: Not on file    Attends meetings of clubs or organizations: Not on file    Relationship status: Not on file  . Intimate partner violence:    Fear of current or ex partner: Not on file    Emotionally abused: Not on file    Physically abused: Not on file    Forced sexual activity: Not on file  Other Topics Concern  . Not on file  Social History Narrative  . Not on file    Current Outpatient Medications on File Prior to Visit  Medication Sig Dispense Refill  . cholecalciferol (VITAMIN D) 1000 units tablet Take 1 tablet (1,000 Units total) by mouth daily. 90 tablet 0  . diphenhydrAMINE (BENADRYL) 25 mg capsule Take 50 mg by mouth at bedtime as needed for allergies or sleep.    . Multiple Vitamin (MULTIVITAMIN WITH MINERALS) TABS Take 1 tablet by mouth daily.    . Omega-3 Fatty Acids (FISH OIL) 1200 MG CAPS Take 1 capsule by mouth at bedtime.      . Omeprazole 20 MG TBEC Take 1 tablet (20 mg total) by mouth daily before breakfast. 30 each 5  . simethicone (PHAZYME) 125 MG chewable tablet Chew 125 mg by mouth every 6 (six) hours as needed for flatulence.    . Wheat Dextrin (BENEFIBER DRINK MIX PO) Take 1 Package by mouth daily.      No current facility-administered medications on file prior to visit.     Allergies  Allergen Reactions  . Levofloxacin Other (See Comments)    Pt can not have due to taking Lexapro   . Lisinopril Swelling  . Penicillins Other (See Comments)    Loopy Has patient had a PCN reaction causing immediate rash, facial/tongue/throat swelling, SOB or lightheadedness with hypotension: No Has patient had a PCN reaction causing severe rash involving mucus membranes or skin necrosis: No Has patient had a PCN reaction that required hospitalization: No Has patient had a PCN reaction occurring within the last 10 years: No If all of the above answers are "NO", then may proceed with Cephalosporin use.  "knocked out.      Family History  Problem Relation Age of Onset  . Hypertension Father   . Diabetes Father   . Depression Other   . Uterine cancer Mother        ?cervical cancer  . Uterine cancer Sister        suicide  . Colon cancer Paternal Aunt 26  . Thyroid disease Neg Hx     BP 128/60   Pulse 84   Ht 5\' 3"  (1.6 m)   Wt 236 lb (107 kg)   LMP 10/09/2014   SpO2 98%   BMI 41.81 kg/m     Review of Systems denies hoarseness, diplopia, polyuria, edema, excessive diaphoresis, and easy bruising. She has fatigue, headache, weight gain, doe, intermitt diarrhea, anxiety, heat intolerance, nocturia, insomnia, rhinorrhea, myalgias, and excessive appetite.       Objective:   Physical Exam VS: see vs page GEN: no distress HEAD: head: no deformity eyes: no periorbital swelling, no proptosis external nose and ears are normal mouth: no lesion seen NECK: supple, thyroid is not enlarged CHEST WALL: no  deformity LUNGS: clear to auscultation CV: reg rate and rhythm, no murmur ABD: abdomen is soft, nontender.  no hepatosplenomegaly.  not distended.  no hernia MUSCULOSKELETAL: muscle bulk and strength are grossly normal.  no obvious joint swelling.  gait is normal  and steady EXTEMITIES: no deformity.  no edema PULSES: no carotid bruit NEURO:  cn 2-12 grossly intact.   readily moves all 4's.  sensation is intact to touch on all 4's.  No tremor.  SKIN:  Normal texture and temperature.  No rash or suspicious lesion is visible.  Nor diaphoretic NODES:  None palpable at the neck PSYCH: alert, well-oriented.  Does not appear anxious nor depressed.    Homogeneous tracer distribution in both thyroid lobes. No focal areas of increased or decreased tracer localization seen. 4 hour I-123 uptake = 3% (normal 5-20%) 24 hour I-123 uptake = 13% (normal 10-30%).    Lab Results  Component Value Date   TSH 0.02 (L) 05/10/2018   I have reviewed outside records, and summarized: Pt was noted to have hyperthyroidism, and referred here.  She was rx'ed low-dosage tapazole, but she continue to have sxs and abnormal TFT      Assessment & Plan:  Hyperthyroidism, new to me.  We discussed rx options.  She choose [facility]   Patient Instructions  Please stop taking the methimazole. Please reduce the metoprolol to 1/2 pill daily (or 1/4 pill, twice a day). I have ordered for you a treatment pill of radioactive iodine.  Although it is a larger amount of radiation than before, you will again notice no symptoms from this.  The pill is gone from your body in a few days (during which you should stay away from other people), but takes several months to work.  Therefore, please return here approximately 6-8 weeks after the treatment pill.  This treatment has been available for many years, and the only known side-effect is an underactive thyroid.  It is possible that i would eventually prescribe for you a thyroid hormone pill,  which is very inexpensive.  You don't have to worry about side-effects of this thyroid hormone pill, because it is the same molecule your thyroid makes.       Radioiodine (I-131) Therapy for Hyperthyroidism Radioiodine (I-131) therapy is a procedure to treat an overactive thyroid gland (hyperthyroidism). The thyroid is a gland in the neck that uses iodine to help control how the body uses food (metabolism). In this procedure, you swallow a pill or liquid that contains I-131. I-131 is manufactured (synthetic) iodine that gives off radiation. This destroys thyroid cells and reverses hyperthyroidism. Tell a health care provider about:  Any allergies you have.  All medicines you are taking, including vitamins, herbs, eye drops, creams, and over-the-counter medicines.  Any problems you or family members have had with anesthetic medicines.  Any blood disorders you have.  Any surgeries you have had.  Any medical conditions you have.  Whether you are pregnant, may be pregnant, or have gone through menopause, if this applies.  Whether you currently have children.  Whether you plan to have children in the next 2 years.  Any contact you have with children or pregnant women.  Your travel plans for the next 3 months.  Whether you pass through radiation detectors for work or travel. What are the risks? Generally, this is a safe procedure. However, problems may occur, including:  Damage to other structures or organs, such as the salivary glands. This could lead to dry mouth and loss of taste.  Low sperm count, if this applies. This may lead to temporary infertility.  Sore throat or neck pain. This is temporary.  Slightlyincreased risk of thyroid cancer.  Nausea or vomiting.  What happens before the procedure?  Ask your  health care provider about changing or stopping your regular medicines. This is especially important if you are taking diabetes medicines, blood thinners, or thyroid  medicines.  Women may be asked to take a pregnancy test.  Women who are breastfeeding should plan to stop at least 6 weeks before the procedure.  Follow instructions from your health care provider about eating or drinking restrictions.  Plan to avoid contact with others for 1 week after your treatment. It is most important to avoid contact with children and pregnant women. To do this, plan to stay home from work, arrange child care, and sleep alone, if these things apply to you.  Plan to drive yourself home after treatment. Do not take public transportation. If you need someone to drive you home, sit as far away from the driver as possible. What happens during the procedure?  You will be given a dose of I-131 to swallow. It may be a pill or a liquid.  Your thyroid gland will absorb the I-131 over the next 3 months. The treatment process will be complete in about 6 months. What happens after the procedure?  You may need to stay in the hospital for 24 hours after your treatment. This depends on the requirements in your state.  Follow instructions from your health care provider about: ? How to take care of yourself after the procedure. ? How to protect others from exposure to radiation as it leaves your body. This information is not intended to replace advice given to you by your health care provider. Make sure you discuss any questions you have with your health care provider. Document Released: 02/01/2009 Document Revised: 02/19/2016 Document Reviewed: 01/10/2015 Elsevier Interactive Patient Education  [employer].

## 2018-07-23 NOTE — Patient Instructions (Addendum)
Please stop taking the methimazole. Please reduce the metoprolol to 1/2 pill daily (or 1/4 pill, twice a day). I have ordered for you a treatment pill of radioactive iodine.  Although it is a larger amount of radiation than before, you will again notice no symptoms from this.  The pill is gone from your body in a few days (during which you should stay away from other people), but takes several months to work.  Therefore, please return here approximately 6-8 weeks after the treatment pill.  This treatment has been available for many years, and the only known side-effect is an underactive thyroid.  It is possible that i would eventually prescribe for you a thyroid hormone pill, which is very inexpensive.  You don't have to worry about side-effects of this thyroid hormone pill, because it is the same molecule your thyroid makes.       Radioiodine (I-131) Therapy for Hyperthyroidism Radioiodine (I-131) therapy is a procedure to treat an overactive thyroid gland (hyperthyroidism). The thyroid is a gland in the neck that uses iodine to help control how the body uses food (metabolism). In this procedure, you swallow a pill or liquid that contains I-131. I-131 is manufactured (synthetic) iodine that gives off radiation. This destroys thyroid cells and reverses hyperthyroidism. Tell a health care provider about:  Any allergies you have.  All medicines you are taking, including vitamins, herbs, eye drops, creams, and over-the-counter medicines.  Any problems you or family members have had with anesthetic medicines.  Any blood disorders you have.  Any surgeries you have had.  Any medical conditions you have.  Whether you are pregnant, may be pregnant, or have gone through menopause, if this applies.  Whether you currently have children.  Whether you plan to have children in the next 2 years.  Any contact you have with children or pregnant women.  Your travel plans for the next 3  months.  Whether you pass through radiation detectors for work or travel. What are the risks? Generally, this is a safe procedure. However, problems may occur, including:  Damage to other structures or organs, such as the salivary glands. This could lead to dry mouth and loss of taste.  Low sperm count, if this applies. This may lead to temporary infertility.  Sore throat or neck pain. This is temporary.  Slightlyincreased risk of thyroid cancer.  Nausea or vomiting.  What happens before the procedure?  Ask your health care provider about changing or stopping your regular medicines. This is especially important if you are taking diabetes medicines, blood thinners, or thyroid medicines.  Women may be asked to take a pregnancy test.  Women who are breastfeeding should plan to stop at least 6 weeks before the procedure.  Follow instructions from your health care provider about eating or drinking restrictions.  Plan to avoid contact with others for 1 week after your treatment. It is most important to avoid contact with children and pregnant women. To do this, plan to stay home from work, arrange child care, and sleep alone, if these things apply to you.  Plan to drive yourself home after treatment. Do not take public transportation. If you need someone to drive you home, sit as far away from the driver as possible. What happens during the procedure?  You will be given a dose of I-131 to swallow. It may be a pill or a liquid.  Your thyroid gland will absorb the I-131 over the next 3 months. The treatment process will be  complete in about 6 months. What happens after the procedure?  You may need to stay in the hospital for 24 hours after your treatment. This depends on the requirements in your state.  Follow instructions from your health care provider about: ? How to take care of yourself after the procedure. ? How to protect others from exposure to radiation as it leaves your  body. This information is not intended to replace advice given to you by your health care provider. Make sure you discuss any questions you have with your health care provider. Document Released: 02/01/2009 Document Revised: 02/19/2016 Document Reviewed: 01/10/2015 Elsevier Interactive Patient Education  [employer].

## 2018-07-26 ENCOUNTER — Ambulatory Visit
Admission: RE | Admit: 2018-07-26 | Discharge: 2018-07-26 | Disposition: A | Discharge status: Home / Self Care | Payer: BLUE CROSS/BLUE SHIELD | Source: Ambulatory Visit | Attending: Internal Medicine | Admitting: Internal Medicine

## 2018-07-26 ENCOUNTER — Telehealth: Payer: Self-pay | Admitting: Endocrinology

## 2018-07-26 ENCOUNTER — Telehealth: Payer: Self-pay

## 2018-07-26 DIAGNOSIS — R921 Mammographic calcification found on diagnostic imaging of breast: Secondary | ICD-10-CM | POA: Diagnosis not present

## 2018-07-26 DIAGNOSIS — N6012 Diffuse cystic mastopathy of left breast: Secondary | ICD-10-CM | POA: Diagnosis not present

## 2018-07-26 DIAGNOSIS — E0590 Thyrotoxicosis, unspecified without thyrotoxic crisis or storm: Secondary | ICD-10-CM

## 2018-07-26 IMAGING — MG STEREOTACTIC CORE NEEDLE BIOPSY
8 of 10 series · 8 of 14 positions shown · non-contrast
Comparison: Previous exams.

ADDENDUM:
Pathology revealed FIBROCYSTIC CHANGES WITH CALCIFICATIONS, USUAL
DUCTAL HYPERPLASIA of the Left breast, upper inner. This was found
to be concordant by Dr. JULIYA JULI.

Pathology results were discussed with the patient by telephone. The
patient reported doing well after the biopsy with tenderness at the
site. Post biopsy instructions and care were reviewed and questions
were answered. The patient was encouraged to call The [REDACTED]
The patient was instructed to return for annual screening
mammography at [facility] in [HOSPITAL[facility]HOSPITAL].
Pathology results reported by JULIYA JULI, RN on [DATE].
CLINICAL DATA: Biopsy of left breast calcifications
EXAM:
LEFT BREAST STEREOTACTIC CORE NEEDLE BIOPSY

[L (1 of 6)]
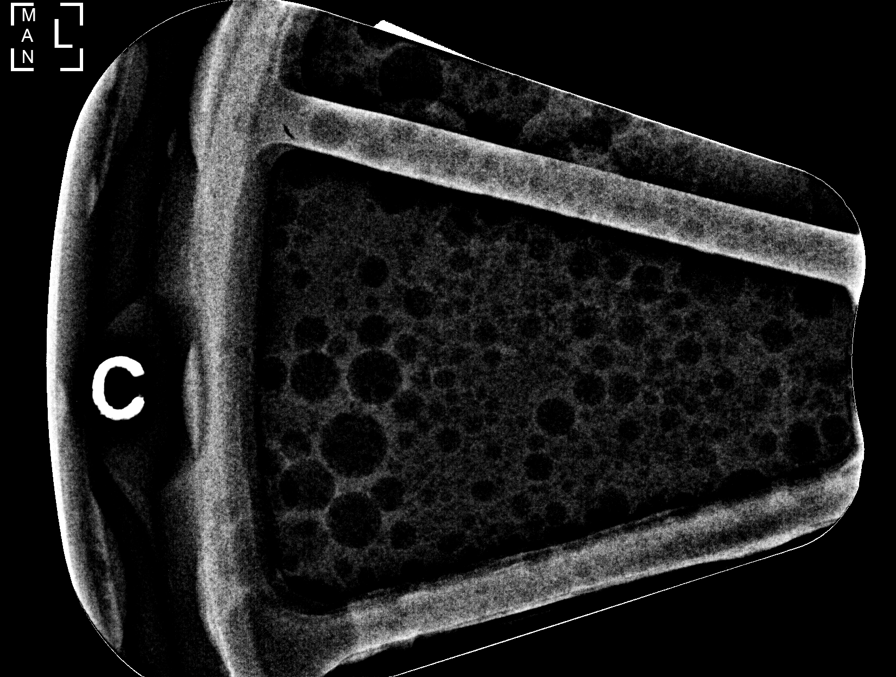

[L (2 of 6)]
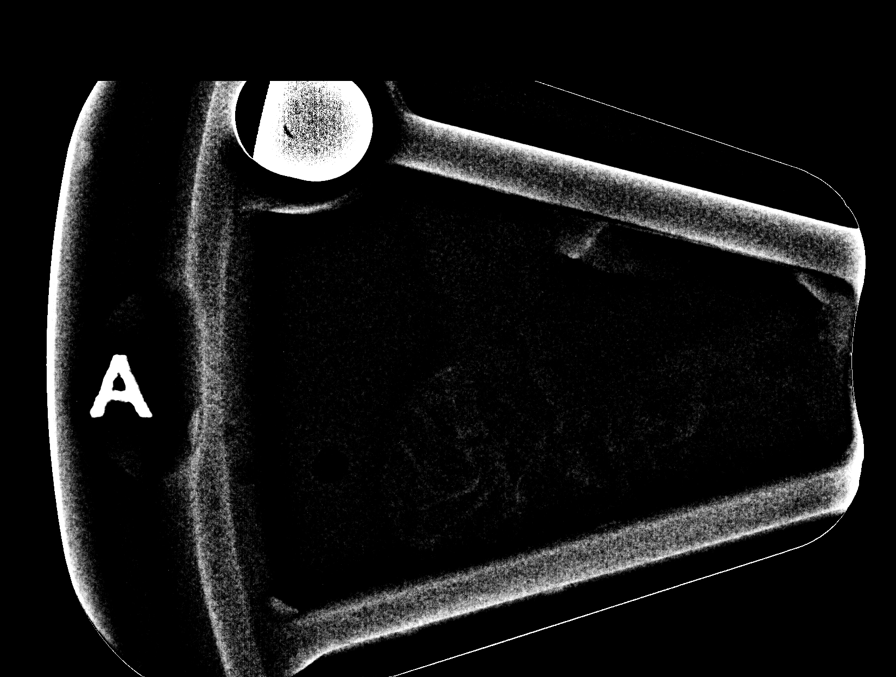

[L (3 of 6)]
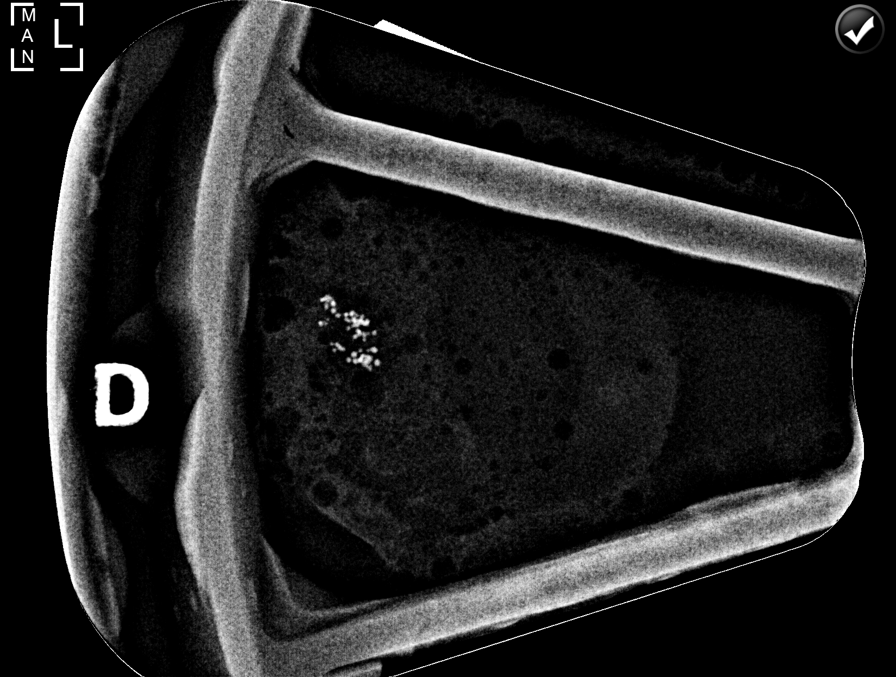

[L (4 of 6)]
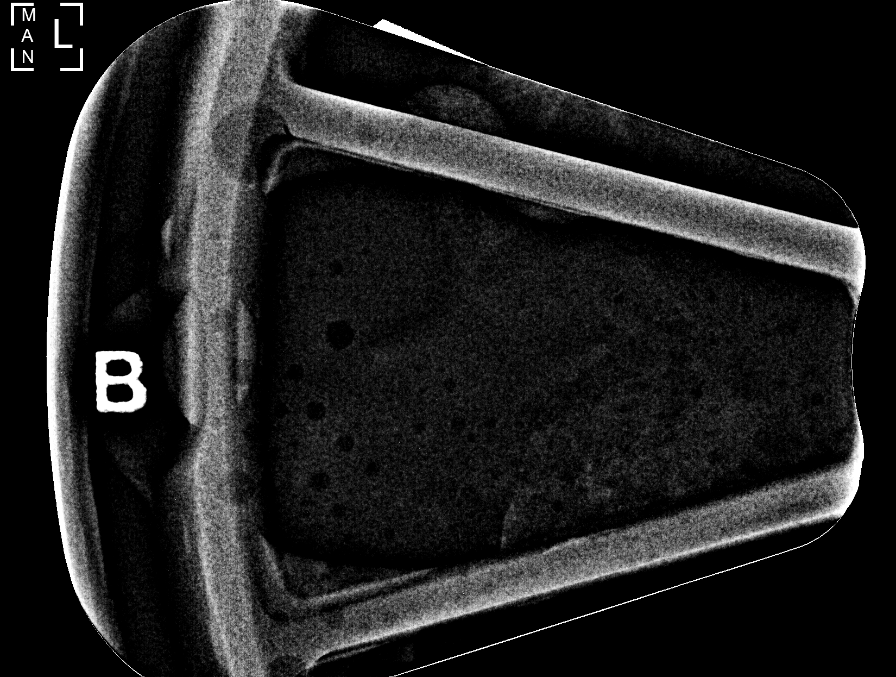

[L (5 of 6)]
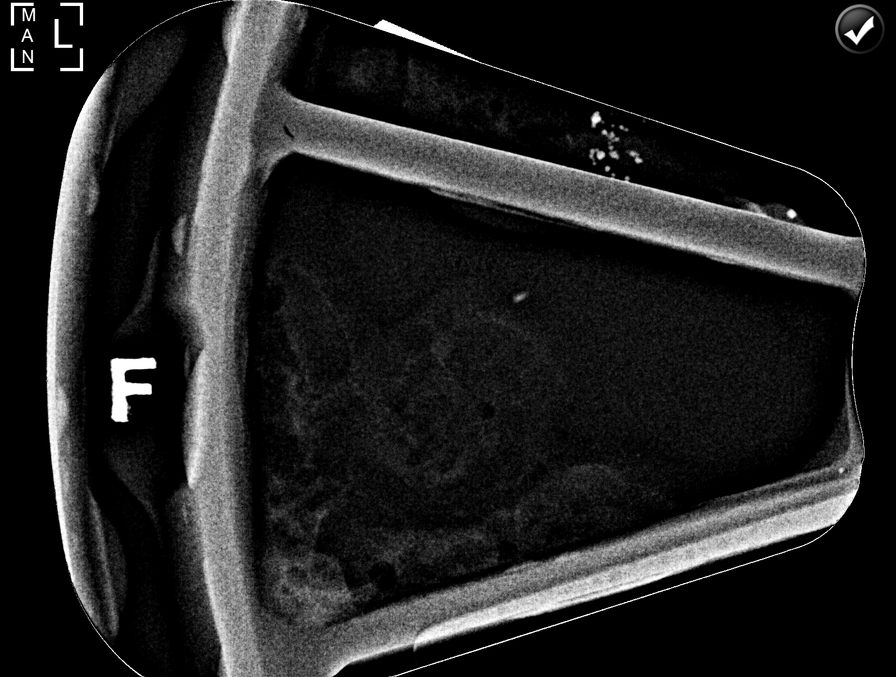

[L (6 of 6)]
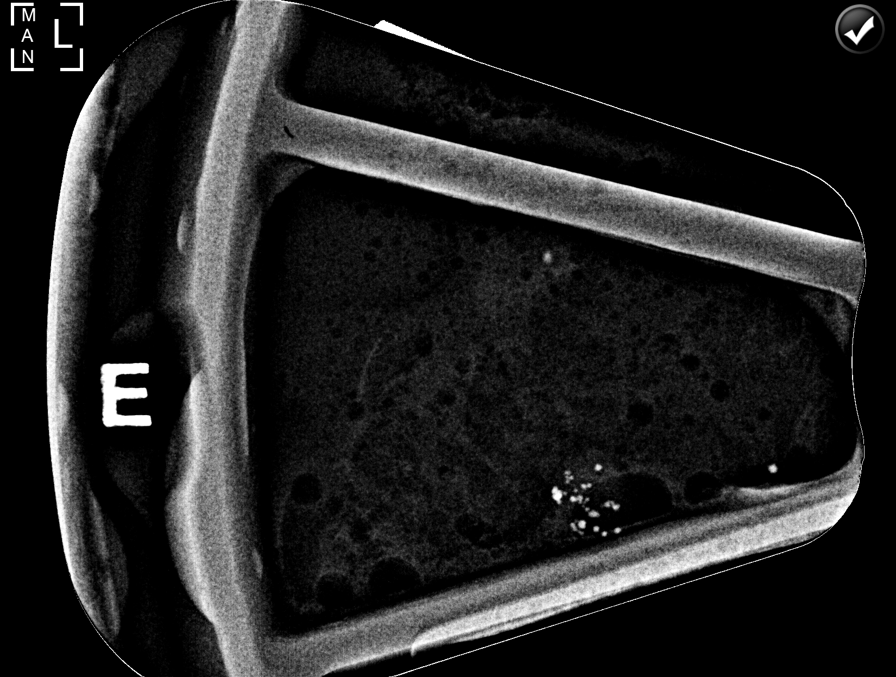

[L CC (1 of 2)]
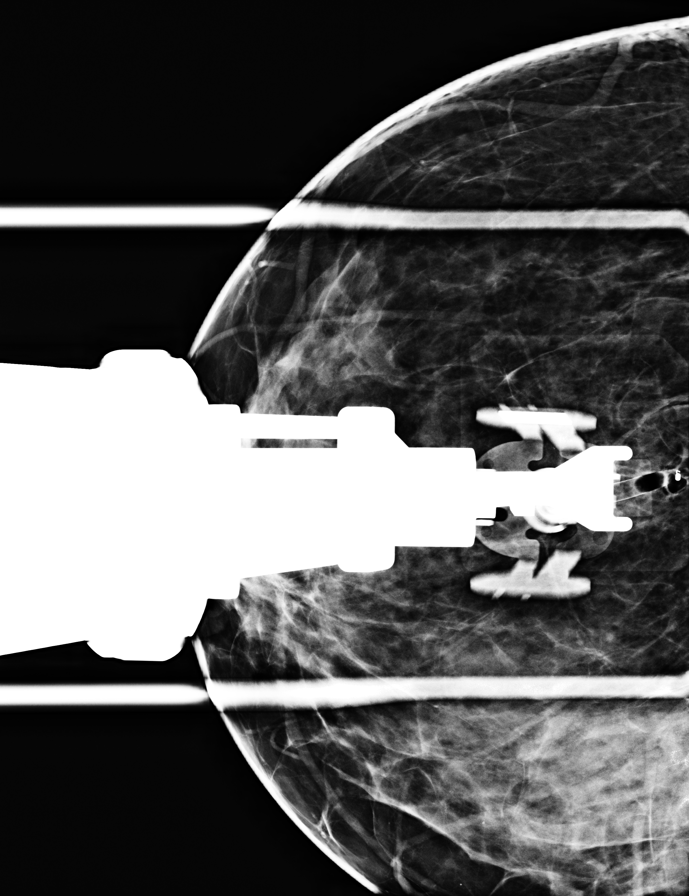

[L CC (2 of 2)]
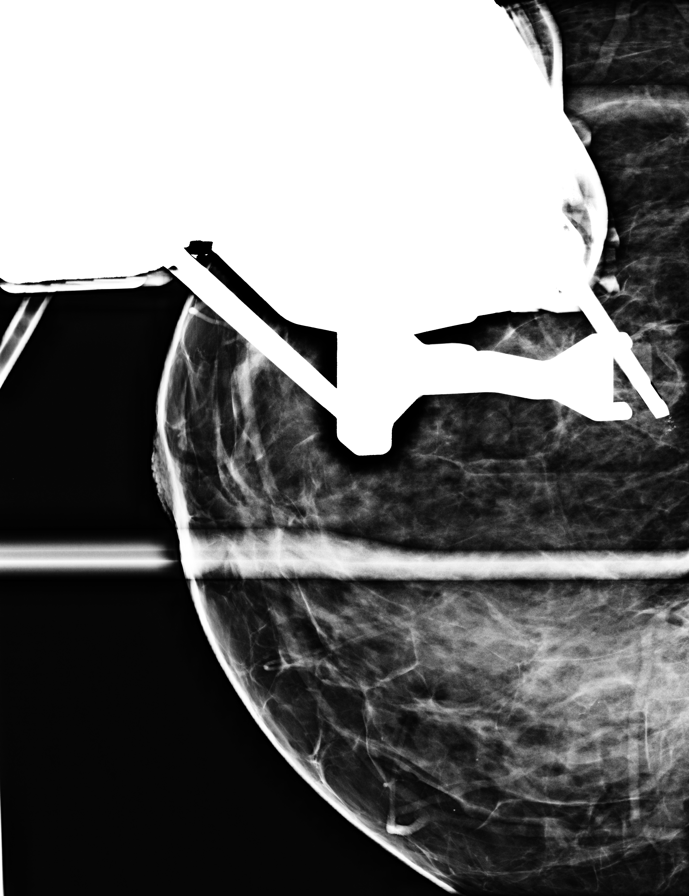

[8 of 14 positions shown; findings below may reference images not displayed]



Using sterile technique and 1% Lidocaine as local anesthetic, under
stereotactic guidance, a 9 gauge vacuum assisted device was used to
perform core needle biopsy of calcifications in the upper inner left
breast using a superior approach. Specimen radiograph was performed
showing calcifications in several specimens. Specimens with
calcifications are identified for pathology.

Lesion quadrant: Upper inner

At the conclusion of the procedure, a coil shaped tissue marker clip
was deployed into the biopsy cavity. Follow-up 2-view mammogram was
performed and dictated separately.
IMPRESSION: Stereotactic-guided biopsy of left breast calcifications. No
apparent complications.

## 2018-07-26 IMAGING — MG MM CLIP PLACEMENT
2 series · 2 of 2 positions shown · non-contrast
Comparison: Previous exam(s).

CLINICAL DATA: Evaluate biopsy marker

EXAM:
DIAGNOSTIC LEFT MAMMOGRAM POST STEREOTACTIC BIOPSY

[L CC]
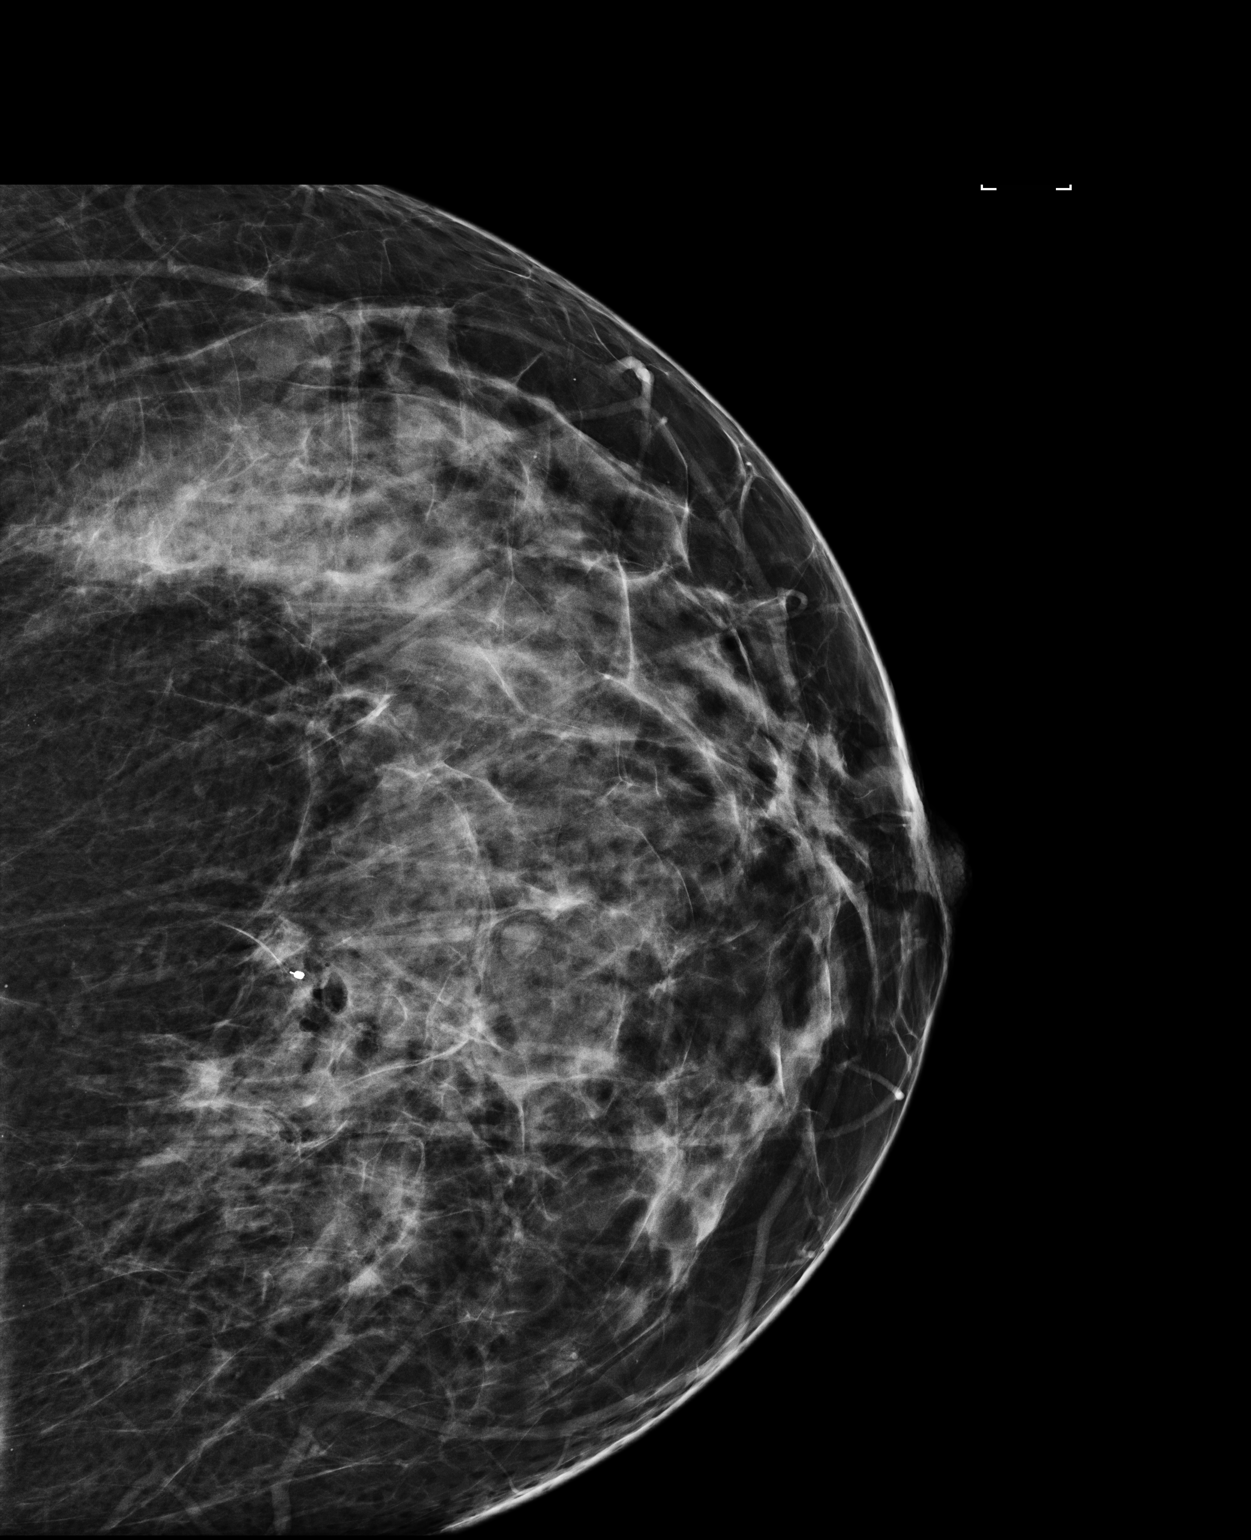

[L ML]
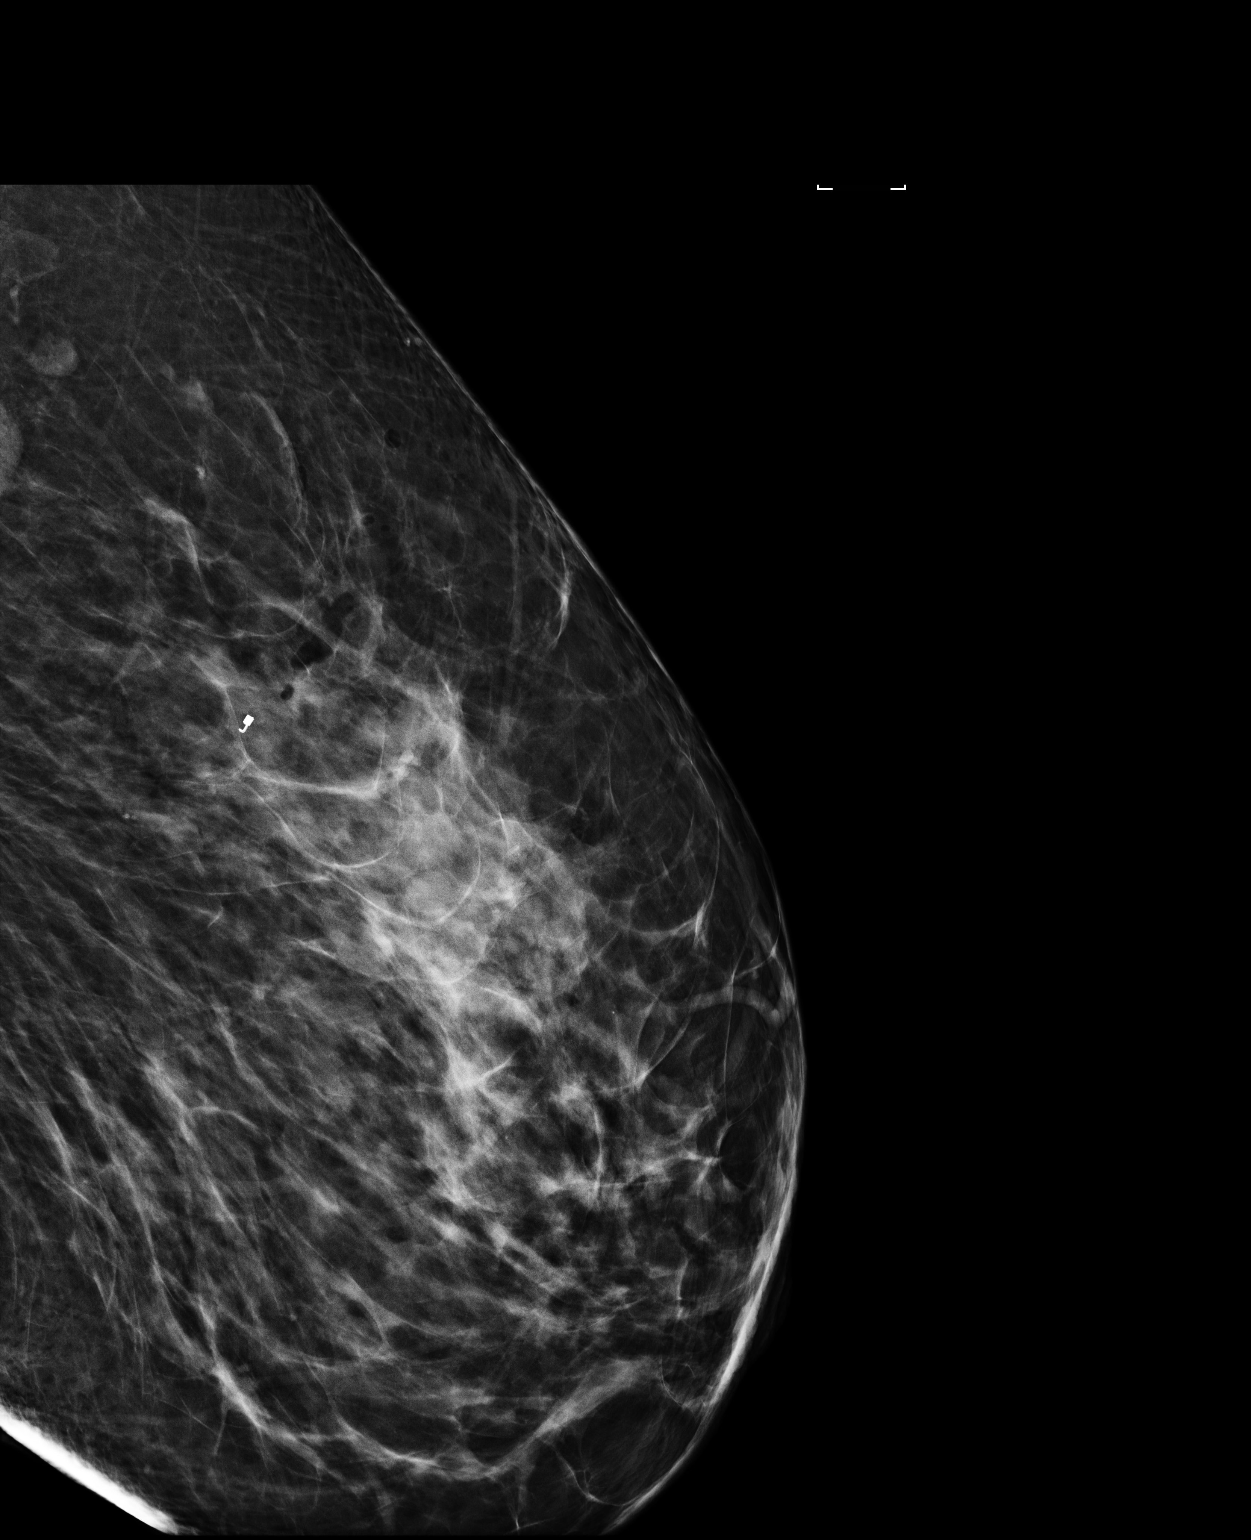

[2 of 2 positions shown; findings below may reference images not displayed]

FINDINGS: Mammographic images were obtained following stereotactic guided
biopsy of left breast calcifications. The coil shaped biopsy marker
is at the site of biopsied calcifications.
IMPRESSION: Appropriate marker placement as above.

Final Assessment: Post Procedure Mammograms for Marker Placement

## 2018-07-26 MED ORDER — METHIMAZOLE 5 MG PO TABS: 5.0000 mg | ORAL_TABLET | Freq: Every day | ORAL | 3 refills | Status: DC

## 2018-07-26 NOTE — Telephone Encounter (Signed)
Please advise re: her request for total thyroidectomy.

## 2018-07-26 NOTE — Telephone Encounter (Signed)
Patient is out of refills on methimazole 5 mg please refill. Patient also wants to change RAI treatment and get a total thyroidectomy. Please Advise. Ph # [phone]

## 2018-07-26 NOTE — Telephone Encounter (Signed)
Ok, I did both ref surg and refill.

## 2018-07-26 NOTE — Telephone Encounter (Signed)
lft vm

## 2018-07-26 NOTE — Telephone Encounter (Signed)
Pt is requesting a refill of methimazole. However, this medication was discontinued from her medication list 04/2018. Message sent to Dr. Loanne Drilling asking if he would like to renew this Rx. Will await his response.

## 2018-07-27 ENCOUNTER — Telehealth: Payer: Self-pay | Admitting: Endocrinology

## 2018-07-27 NOTE — Telephone Encounter (Signed)
I put in surg ref yesterday.  you will receive a phone call, about a day and time for an appointment

## 2018-07-27 NOTE — Telephone Encounter (Signed)
Returned pt call. States she does not wish to undergo RAI tx't. Would prefer to proceed with surgery. Advised I would forward her request to Dr. Loanne Drilling for his opinion/advice. Advised I will call with his orders/recommendations. Verbalized acceptance and understanding.   Dr. Loanne Drilling,  Please advise.

## 2018-07-27 NOTE — Telephone Encounter (Signed)
Patient is requesting a call back at ph# [phone] to discuss thyroid procedure.

## 2018-07-28 NOTE — Telephone Encounter (Signed)
Call pt to make her aware of referral

## 2018-07-28 NOTE — Telephone Encounter (Signed)
Called pt to inform about referral. LVM requesting returned call.

## 2018-07-28 NOTE — Telephone Encounter (Signed)
Called pt and informed of referral to surgeon. Advised to call our office for a status update if she has not received a call within 2-3 weeks re: her appt. Verbalized acceptance and understanding.

## 2018-07-30 ENCOUNTER — Ambulatory Visit: Payer: BLUE CROSS/BLUE SHIELD | Admitting: Endocrinology

## 2018-08-10 DIAGNOSIS — Z6841 Body Mass Index (BMI) 40.0 and over, adult: Secondary | ICD-10-CM | POA: Diagnosis not present

## 2018-08-10 DIAGNOSIS — K219 Gastro-esophageal reflux disease without esophagitis: Secondary | ICD-10-CM | POA: Diagnosis not present

## 2018-08-10 DIAGNOSIS — E669 Obesity, unspecified: Secondary | ICD-10-CM | POA: Diagnosis not present

## 2018-08-10 DIAGNOSIS — E0590 Thyrotoxicosis, unspecified without thyrotoxic crisis or storm: Secondary | ICD-10-CM | POA: Diagnosis not present

## 2018-08-10 DIAGNOSIS — M353 Polymyalgia rheumatica: Secondary | ICD-10-CM | POA: Diagnosis not present

## 2018-08-10 DIAGNOSIS — R5383 Other fatigue: Secondary | ICD-10-CM | POA: Diagnosis not present

## 2018-08-10 DIAGNOSIS — M130 Polyarthritis, unspecified: Secondary | ICD-10-CM | POA: Diagnosis not present

## 2018-08-10 DIAGNOSIS — Z1389 Encounter for screening for other disorder: Secondary | ICD-10-CM | POA: Diagnosis not present

## 2018-08-13 ENCOUNTER — Telehealth: Payer: Self-pay | Admitting: "Endocrinology

## 2018-08-13 NOTE — Telephone Encounter (Signed)
Heather Fry called and cx her appt for Tuesday November 19th 2019 and stated she would not be back to see Dr. Dorris Fetch again, she discharged herself from the practice

## 2018-08-16 DIAGNOSIS — Z1389 Encounter for screening for other disorder: Secondary | ICD-10-CM | POA: Diagnosis not present

## 2018-08-16 NOTE — Telephone Encounter (Signed)
Noted  

## 2018-08-17 ENCOUNTER — Ambulatory Visit: Payer: BLUE CROSS/BLUE SHIELD | Admitting: "Endocrinology

## 2018-08-21 DIAGNOSIS — R1084 Generalized abdominal pain: Secondary | ICD-10-CM | POA: Diagnosis not present

## 2018-08-21 DIAGNOSIS — R748 Abnormal levels of other serum enzymes: Secondary | ICD-10-CM | POA: Diagnosis not present

## 2018-08-21 DIAGNOSIS — F17200 Nicotine dependence, unspecified, uncomplicated: Secondary | ICD-10-CM | POA: Diagnosis not present

## 2018-08-21 DIAGNOSIS — Z79899 Other long term (current) drug therapy: Secondary | ICD-10-CM | POA: Diagnosis not present

## 2018-08-21 DIAGNOSIS — R140 Abdominal distension (gaseous): Secondary | ICD-10-CM | POA: Diagnosis not present

## 2018-08-21 DIAGNOSIS — K219 Gastro-esophageal reflux disease without esophagitis: Secondary | ICD-10-CM | POA: Diagnosis not present

## 2018-08-24 ENCOUNTER — Other Ambulatory Visit (HOSPITAL_COMMUNITY): Payer: Self-pay | Admitting: Surgery

## 2018-08-24 ENCOUNTER — Ambulatory Visit: Payer: Self-pay | Admitting: Surgery

## 2018-08-24 DIAGNOSIS — E0590 Thyrotoxicosis, unspecified without thyrotoxic crisis or storm: Secondary | ICD-10-CM | POA: Diagnosis not present

## 2018-08-25 DIAGNOSIS — E0500 Thyrotoxicosis with diffuse goiter without thyrotoxic crisis or storm: Secondary | ICD-10-CM | POA: Diagnosis not present

## 2018-08-25 DIAGNOSIS — R1013 Epigastric pain: Secondary | ICD-10-CM | POA: Diagnosis not present

## 2018-08-25 DIAGNOSIS — K219 Gastro-esophageal reflux disease without esophagitis: Secondary | ICD-10-CM | POA: Diagnosis not present

## 2018-08-25 DIAGNOSIS — E0590 Thyrotoxicosis, unspecified without thyrotoxic crisis or storm: Secondary | ICD-10-CM | POA: Diagnosis not present

## 2018-08-25 DIAGNOSIS — Z6841 Body Mass Index (BMI) 40.0 and over, adult: Secondary | ICD-10-CM | POA: Diagnosis not present

## 2018-08-25 DIAGNOSIS — Z1389 Encounter for screening for other disorder: Secondary | ICD-10-CM | POA: Diagnosis not present

## 2018-08-31 ENCOUNTER — Ambulatory Visit (HOSPITAL_COMMUNITY)
Admission: RE | Admit: 2018-08-31 | Discharge: 2018-08-31 | Disposition: A | Discharge status: Home / Self Care | Payer: BLUE CROSS/BLUE SHIELD | Source: Ambulatory Visit | Attending: Surgery | Admitting: Surgery

## 2018-08-31 DIAGNOSIS — E042 Nontoxic multinodular goiter: Secondary | ICD-10-CM | POA: Diagnosis not present

## 2018-08-31 DIAGNOSIS — E0590 Thyrotoxicosis, unspecified without thyrotoxic crisis or storm: Secondary | ICD-10-CM | POA: Diagnosis not present

## 2018-08-31 IMAGING — US US THYROID
1 series · 16 of 25 positions shown · non-contrast
Comparison: Nuclear medicine study [DATE]

CLINICAL DATA: 42-year-old female with a history of
hyperthyroidism.

The given history is that the patient is scheduled for surgical
thyroidectomy [DATE]
EXAM:
THYROID ULTRASOUND
TECHNIQUE: Ultrasound examination of the thyroid gland and adjacent soft
tissues was performed.

[Series 1: us thyroid · 0.06mm/px · 16 of 96 slices shown]
[im 1/96]
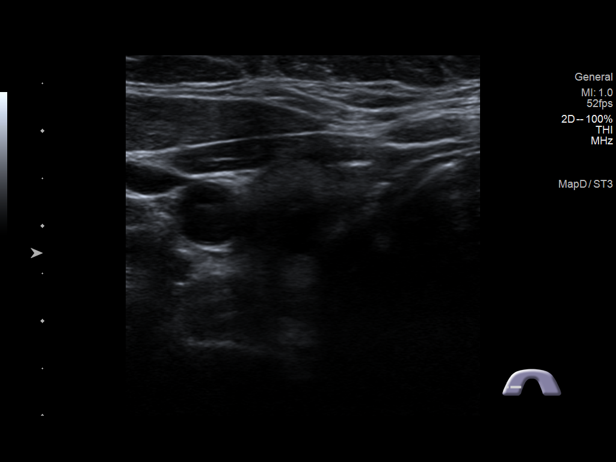
[im 8/96]
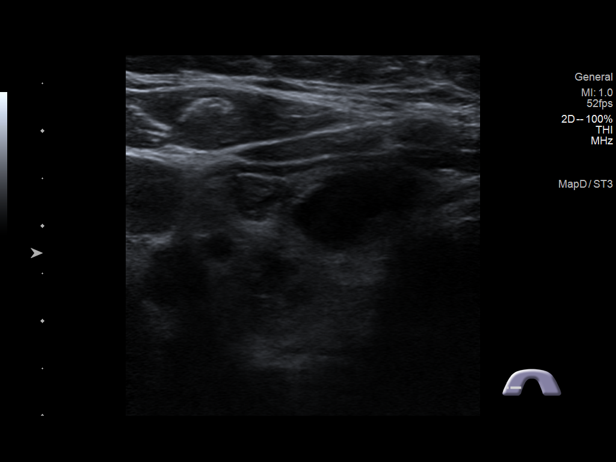
[im 12/96]
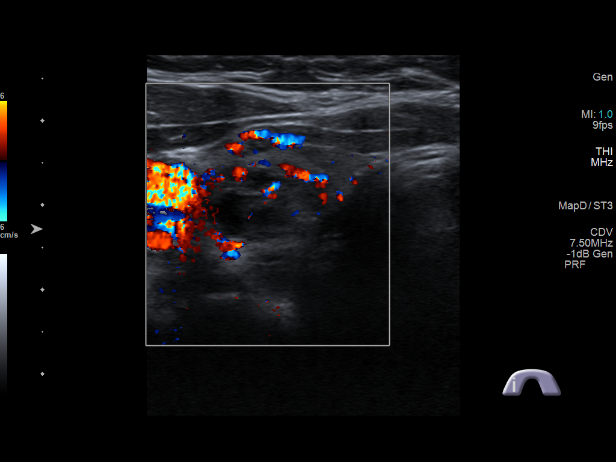
[im 20/96]
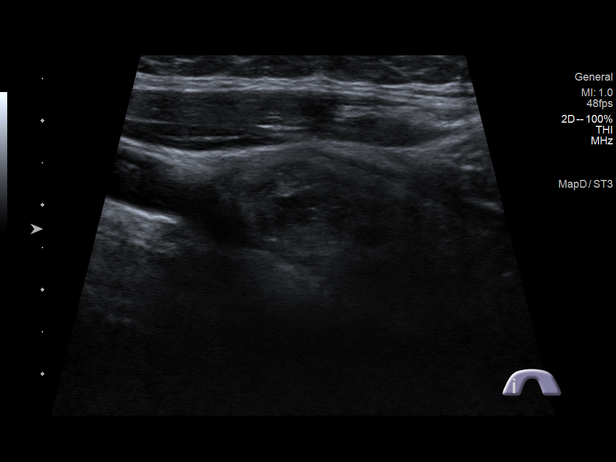
[im 28/96]
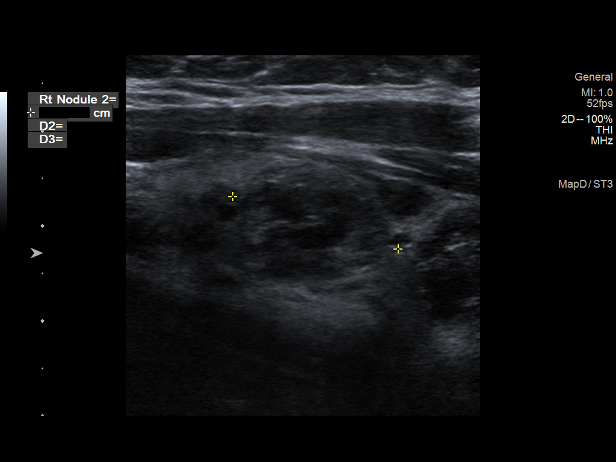
[im 32/96]
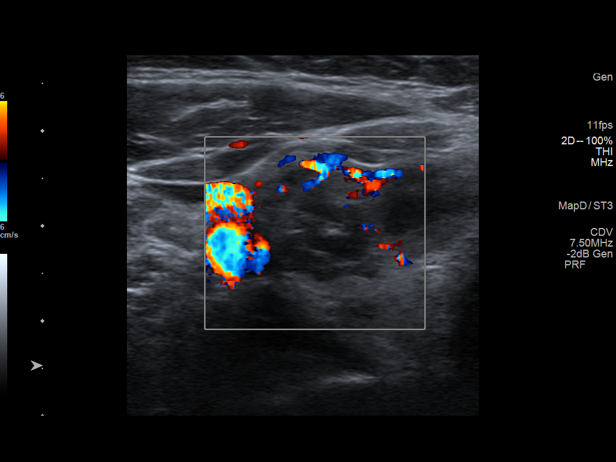
[im 40/96]
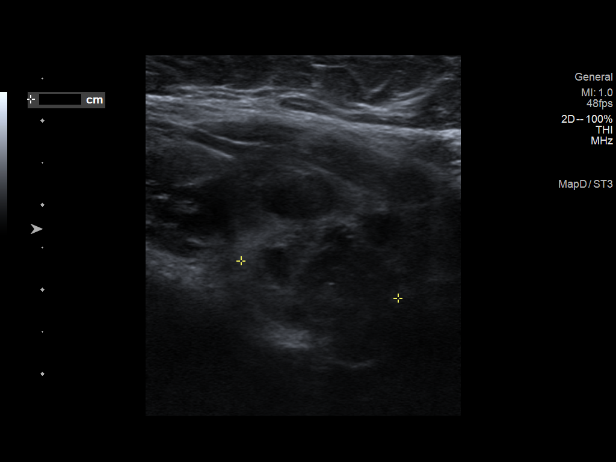
[im 44/96]
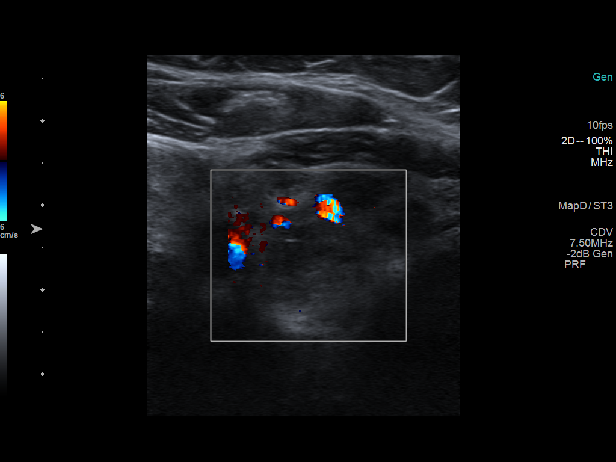
[im 52/96]
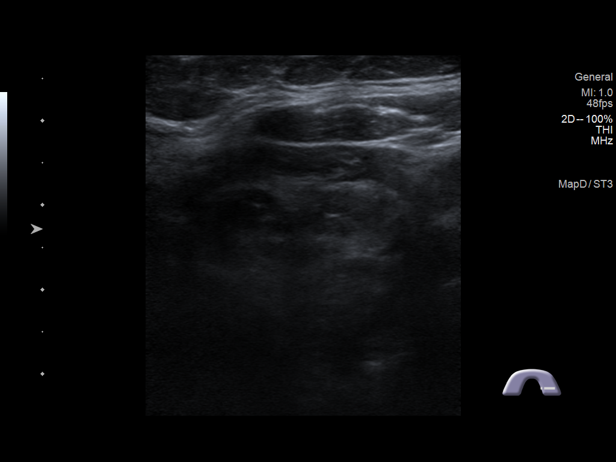
[im 56/96]
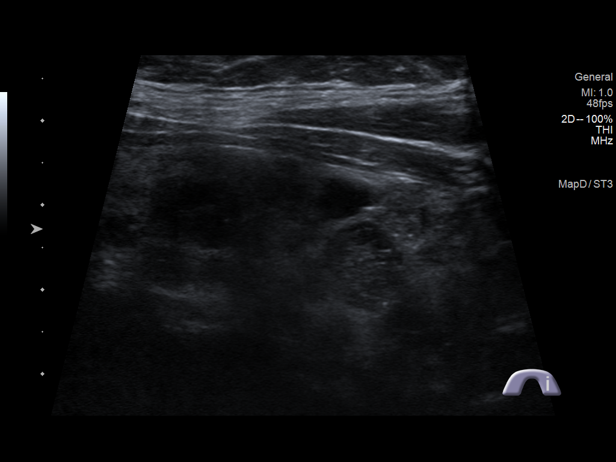
[im 64/96]
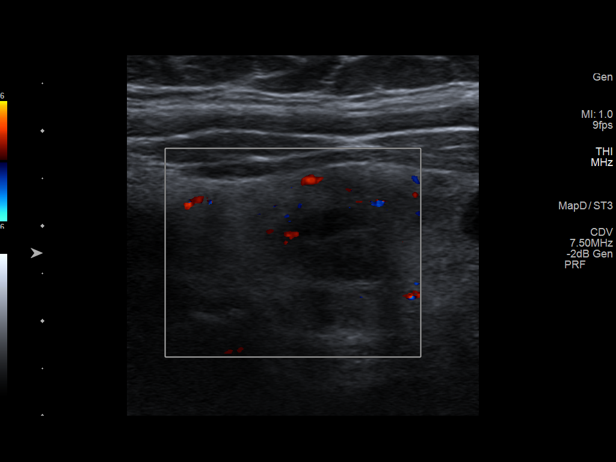
[im 68/96]
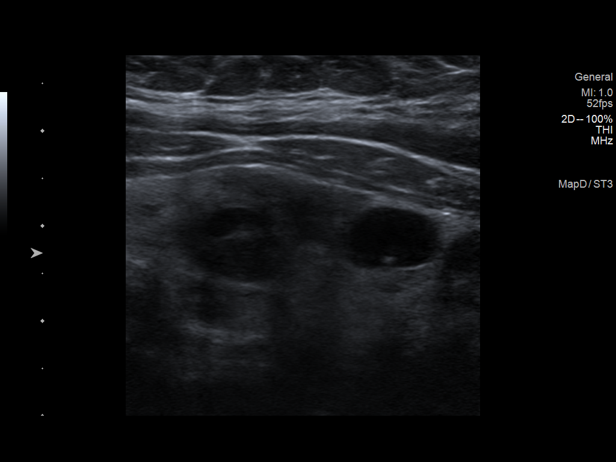
[im 76/96]
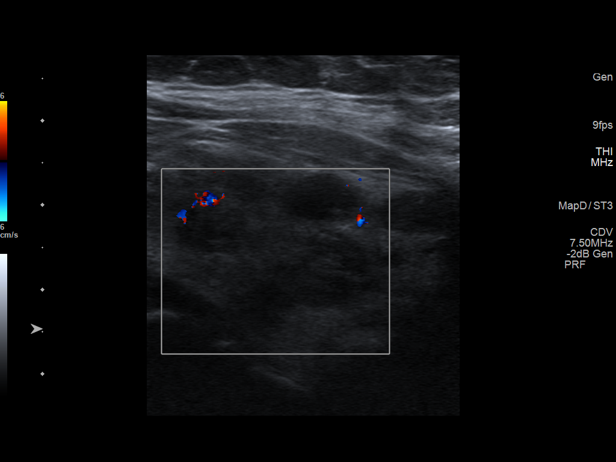
[im 84/96]
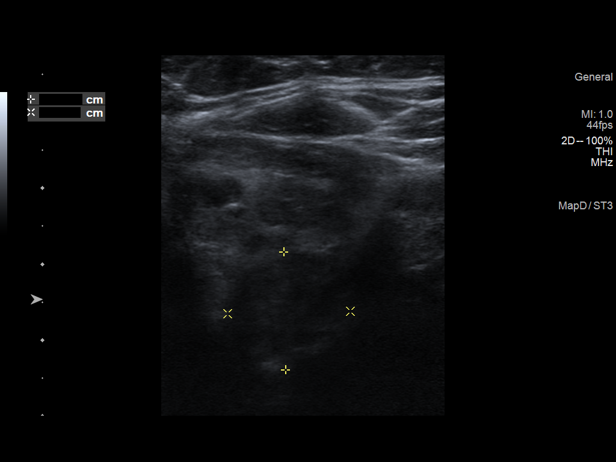
[im 88/96]
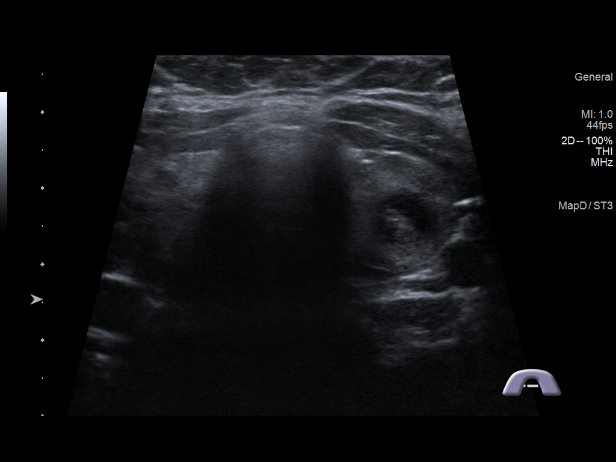
[im 96/96]
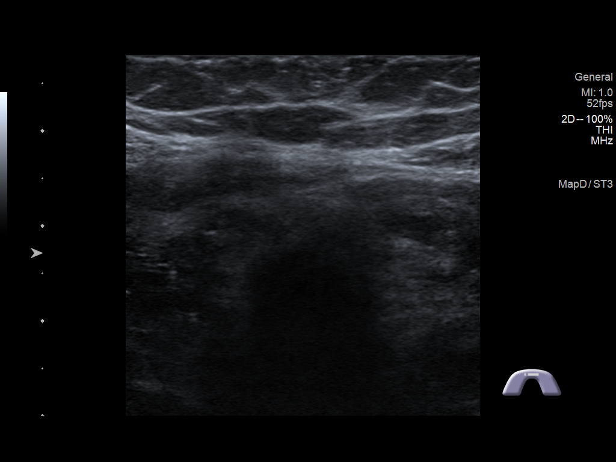

[16 of 25 positions shown; findings below may reference images not displayed]

FINDINGS: Parenchymal Echotexture: Moderately heterogenous

Isthmus: 0.6 cm

Right lobe: 5.6 cm x 2.8 cm x 2.5 cm

Left lobe: 5.6 cm x 2.6 cm x 2.0 cm

_________________________________________________________

Estimated total number of nodules >/= 1 cm: 6-10

Number of spongiform nodules >/=  2 cm not described below (TR1): 0

Number of mixed cystic and solid nodules >/= 1.5 cm not described
below (TR2): 0

_________________________________________________________

Nodule # 1:

Location: Right; Superior

Maximum size: 1.7 cm; Other 2 dimensions: 1.0 cm x 1.4 cm

Composition: spongiform (0)

ACR TI-RADS recommendations:

Spongiform nodule does not meet criteria for surveillance or biopsy

_________________________________________________________

Nodule # 2:

Location: Right; Mid

Maximum size: 1.8 cm; Other 2 dimensions: 1.3 cm x 1.2 cm

Composition: mixed cystic and solid (1)

Echogenicity: isoechoic (1)

Shape: taller-than-wide (3)

Margins: ill-defined (0)

Echogenic foci: none (0)

ACR TI-RADS total points: 5.

ACR TI-RADS risk category: TR4 (4-6 points).

ACR TI-RADS recommendations:

Nodule meets criteria for biopsy

_________________________________________________________

Nodule # 3:

Location: Right; Mid

Maximum size: 1.8 cm; Other 2 dimensions: 1.1 cm x 1.7 cm

Composition: mixed cystic and solid (1)

Echogenicity: isoechoic (1)

Shape: not taller-than-wide (0)

Margins: ill-defined (0)

Echogenic foci: none (0)

ACR TI-RADS total points: 2.

ACR TI-RADS risk category: TR2 (2 points).

ACR TI-RADS recommendations:

Nodule does not meet criteria for surveillance or biopsy

_________________________________________________________

Nodule # 4:

Location: Right; Inferior

Maximum size: 1.9 cm; Other 2 dimensions: 1.7 cm x 1.7 cm

Composition: mixed cystic and solid (1)

Echogenicity: hypoechoic (2)

Shape: not taller-than-wide (0)

Margins: ill-defined (0)

Echogenic foci: none (0)

ACR TI-RADS total points: 3.

ACR TI-RADS risk category: TR3 (3 points).

ACR TI-RADS recommendations:

Nodule meets criteria for surveillance

_________________________________________________________

Nodule # 5:

Location: Left; Superior

Maximum size: 1.3 cm; Other 2 dimensions: 1.1 cm x 1.0 cm

Composition: cannot determine (2)

Echogenicity: very hypoechoic (3)

Shape: not taller-than-wide (0)

Margins: ill-defined (0)

Echogenic foci: none (0)

ACR TI-RADS total points: 5.

ACR TI-RADS risk category: TR4 (4-6 points).

ACR TI-RADS recommendations:

Nodule meets criteria for surveillance

_________________________________________________________

Nodule # 6:

Location: Left; Mid

Maximum size: 1.2 cm; Other 2 dimensions: 1.1 cm x 0.7 cm

Composition: cystic/almost completely cystic (0)

Echogenicity: anechoic (0)

Shape: not taller-than-wide (0)

Margins: smooth (0)

Echogenic foci: none (0)

ACR TI-RADS total points: 0.

ACR TI-RADS risk category: TR1 (0-1 points).

ACR TI-RADS recommendations:

Cystic nodule does not meet criteria for surveillance or biopsy

_________________________________________________________

Nodule # 7:

Location: Left; Inferior

Maximum size: 1.3 cm; Other 2 dimensions: 1.2 cm x 1.1 cm

Composition: cannot determine (2)

Echogenicity: hypoechoic (2)

Shape: not taller-than-wide (0)

Margins: ill-defined (0)

Echogenic foci: none (0)

ACR TI-RADS total points: 4.

ACR TI-RADS risk category: TR4 (4-6 points).

ACR TI-RADS recommendations:

Nodule meets criteria for surveillance

_________________________________________________________

Nodule # 8:

Location: Left; Inferior

Maximum size: 1.6 cm; Other 2 dimensions: 1.6 cm x 1.6 cm

Composition: cannot determine (2)

Echogenicity: hypoechoic (2)

Shape: not taller-than-wide (0)

Margins: ill-defined (0)

Echogenic foci: none (0)

ACR TI-RADS total points: 4.

ACR TI-RADS risk category: TR4 (4-6 points).

ACR TI-RADS recommendations:

Nodule meets criteria for biopsy

_________________________________________________________

No adenopathy
IMPRESSION: Heterogeneous multinodular thyroid, suggesting medical thyroid
disease.

The right mid (labeled 2) and the left inferior (labeled 8) thyroid
nodules meet criteria for biopsy, as designated by the newly
established ACR TI-RADS criteria, and referral for biopsy is
recommended.

Right inferior (labeled 4), left superior (labeled 5), and left
inferior (labeled 7), thyroid nodules meet criteria for
surveillance, as designated by the newly established ACR TI-RADS
criteria. Surveillance ultrasound study recommended to be performed
annually up to 5 years.

Recommendations follow those established by the new ACR TI-RADS
criteria ([HOSPITAL] [N5];[DATE]).

## 2018-09-02 ENCOUNTER — Encounter: Payer: Self-pay | Admitting: Endocrinology

## 2018-09-02 ENCOUNTER — Ambulatory Visit (INDEPENDENT_AMBULATORY_CARE_PROVIDER_SITE_OTHER): Payer: BLUE CROSS/BLUE SHIELD | Admitting: Endocrinology

## 2018-09-02 VITALS — BP 140/88 | HR 79 | Ht 63.0 in | Wt 239.6 lb

## 2018-09-02 DIAGNOSIS — E0590 Thyrotoxicosis, unspecified without thyrotoxic crisis or storm: Secondary | ICD-10-CM | POA: Diagnosis not present

## 2018-09-02 LAB — T4, FREE: FREE T4: 0.64 ng/dL (ref 0.60–1.60)

## 2018-09-02 NOTE — Patient Instructions (Addendum)
blood tests are requested for you today.  We'll let you know about the results.   After the surgery, please come back for a follow-up appointment 1-2 weeks later.

## 2018-09-02 NOTE — Progress Notes (Signed)
Subjective:    Patient ID: Heather Fry, female    DOB: 12-Feb-1976, 42 y.o.   MRN: [id number]  HPI Pt returns for f/u of hyperthyroidism (dx'ed 2017; she was rx'ed methimazole; he was also rx'ed metoprolol, but she can only tolerate 12.5 mg PRN (at 25, she gets low BP and HR).  Thyroidectomy is sched for 09/30/18.  She takes meds as rx'ed.  pt states she feels well in general, except for slight lightheadedness.   Past Medical History:  Diagnosis Date  . Anxiety   . Depression   . Diverticulosis   . GERD (gastroesophageal reflux disease)   . Internal hemorrhoids   . Thyroid disease   . Uterine fibroid     Past Surgical History:  Procedure Laterality Date  . ABDOMINAL HYSTERECTOMY     Per patient, uterine fibroids.  . ABDOMINAL HYSTERECTOMY    . CHOLECYSTECTOMY N/A 11/06/2014   Procedure: LAPAROSCOPIC CHOLECYSTECTOMY;  Surgeon: Jamesetta So, MD;  Location: [facility];  Service: General;  Laterality: N/A;  . COLONOSCOPY N/A 03/04/2017   Procedure: COLONOSCOPY;  Surgeon: Daneil Dolin, MD;  Location: [facility];  Service: Endoscopy;  Laterality: N/A;  8:30am  . DILATION AND CURETTAGE OF UTERUS    . ESOPHAGOGASTRODUODENOSCOPY N/A 10/02/2014   Procedure: ESOPHAGOGASTRODUODENOSCOPY (EGD);  Surgeon: Daneil Dolin, MD;  Location: [facility];  Service: Endoscopy;  Laterality: N/A;  945am - moved to 8:45 - Ginger notified pt  . MOUTH SURGERY     extraction of teeth  . POLYPECTOMY  03/04/2017   Procedure: POLYPECTOMY;  Surgeon: Daneil Dolin, MD;  Location: [facility];  Service: Endoscopy;;  colon  . TUBAL LIGATION      Social History   Socioeconomic History  . Marital status: Married    Spouse name: Not on file  . Number of children: Not on file  . Years of education: Not on file  . Highest education level: Not on file  Occupational History  . Not on file  Social Needs  . Financial resource strain: Not on file  . Food insecurity:    Worry: Not on file    Inability: Not  on file  . Transportation needs:    Medical: Not on file    Non-medical: Not on file  Tobacco Use  . Smoking status: Current Every Day Smoker    Packs/day: 0.50    Years: 3.00    Pack years: 1.50    Types: Cigarettes    Last attempt to quit: 11/03/1997    Years since quitting: 20.8  . Smokeless tobacco: Never Used  . Tobacco comment: 1 pack 1 week  Substance and Sexual Activity  . Alcohol use: No    Alcohol/week: 0.0 standard drinks  . Drug use: No  . Sexual activity: Yes    Birth control/protection: Surgical  Lifestyle  . Physical activity:    Days per week: Not on file    Minutes per session: Not on file  . Stress: Not on file  Relationships  . Social connections:    Talks on phone: Not on file    Gets together: Not on file    Attends religious service: Not on file    Active member of club or organization: Not on file    Attends meetings of clubs or organizations: Not on file    Relationship status: Not on file  . Intimate partner violence:    Fear of current or ex partner: Not on file  Emotionally abused: Not on file    Physically abused: Not on file    Forced sexual activity: Not on file  Other Topics Concern  . Not on file  Social History Narrative  . Not on file    Current Outpatient Medications on File Prior to Visit  Medication Sig Dispense Refill  . cholecalciferol (VITAMIN D) 1000 units tablet Take 1 tablet (1,000 Units total) by mouth daily. (Patient taking differently: Take 5,000 Units by mouth daily. ) 90 tablet 0  . diphenhydrAMINE (BENADRYL) 25 mg capsule Take 50 mg by mouth at bedtime as needed for allergies or sleep.    Marland Kitchen escitalopram (LEXAPRO) 20 MG tablet Take 1 tablet (20 mg total) by mouth at bedtime. 30 tablet 5  . metoprolol tartrate (LOPRESSOR) 25 MG tablet Take 0.5 tablets (12.5 mg total) by mouth daily. 30 tablet 3  . Multiple Vitamin (MULTIVITAMIN WITH MINERALS) TABS Take 1 tablet by mouth daily.    . Omega-3 Fatty Acids (FISH OIL) 1200 MG  CAPS Take 1 capsule by mouth at bedtime.     . pantoprazole (PROTONIX) 40 MG tablet Take 40 mg by mouth daily.    . simethicone (PHAZYME) 125 MG chewable tablet Chew 125 mg by mouth every 6 (six) hours as needed for flatulence.    . Wheat Dextrin (BENEFIBER DRINK MIX PO) Take 1 Package by mouth daily.      No current facility-administered medications on file prior to visit.     Allergies  Allergen Reactions  . Levofloxacin Other (See Comments)    Pt can not have due to taking Lexapro   . Lisinopril Swelling  . Penicillins Other (See Comments)    Loopy Has patient had a PCN reaction causing immediate rash, facial/tongue/throat swelling, SOB or lightheadedness with hypotension: No Has patient had a PCN reaction causing severe rash involving mucus membranes or skin necrosis: No Has patient had a PCN reaction that required hospitalization: No Has patient had a PCN reaction occurring within the last 10 years: No If all of the above answers are "NO", then may proceed with Cephalosporin use.  "knocked out.      Family History  Problem Relation Age of Onset  . Hypertension Father   . Diabetes Father   . Depression Other   . Uterine cancer Mother        ?cervical cancer  . Uterine cancer Sister        suicide  . Colon cancer Paternal Aunt 105  . Thyroid disease Neg Hx     BP 140/88 (BP Location: Left Arm, Patient Position: Sitting, Cuff Size: Large)   Pulse 79   Ht 5\' 3"  (1.6 m)   Wt 239 lb 9.6 oz (108.7 kg)   LMP 10/09/2014   SpO2 96%   BMI 42.44 kg/m    Review of Systems Denies fever.      Objective:   Physical Exam VITAL SIGNS:  See vs page GENERAL: no distress NECK: There is no palpable thyroid enlargement.  No thyroid nodule is palpable.  No palpable lymphadenopathy at the anterior neck.   Lab Results  Component Value Date   TSH <0.01 Repeated and verified X2. (L) 09/02/2018      Assessment & Plan:  Hyperthyroidism: persistent.  increase the methimazole to 10  mg, twice a day.   Patient Instructions  blood tests are requested for you today.  We'll let you know about the results.  After the surgery, please come back for a follow-up appointment  1-2 weeks later.

## 2018-09-03 LAB — TSH

## 2018-09-03 MED ORDER — METHIMAZOLE 10 MG PO TABS: 10.0000 mg | ORAL_TABLET | Freq: Two times a day (BID) | ORAL | 0 refills | Status: DC

## 2018-09-09 ENCOUNTER — Encounter: Payer: Self-pay | Admitting: Endocrinology

## 2018-09-09 ENCOUNTER — Ambulatory Visit (INDEPENDENT_AMBULATORY_CARE_PROVIDER_SITE_OTHER): Payer: BLUE CROSS/BLUE SHIELD | Admitting: Endocrinology

## 2018-09-09 VITALS — BP 130/88 | HR 83 | Ht 63.0 in | Wt 242.6 lb

## 2018-09-09 DIAGNOSIS — E0590 Thyrotoxicosis, unspecified without thyrotoxic crisis or storm: Secondary | ICD-10-CM

## 2018-09-09 NOTE — Progress Notes (Signed)
Subjective:    Patient ID: Heather Fry, female    DOB: 1976/09/08, 42 y.o.   MRN: [id number]  HPI Pt returns for f/u of hyperthyroidism (dx'ed 2017; she was rx'ed methimazole; he was also rx'ed metoprolol, but she can only tolerate 12.5 mg PRN (at 25, she gets low BP and HR).  Thyroidectomy is sched for 09/30/18.  She takes meds as rx'ed.  pt states she feels well in general, except for slight lightheadedness.  Past Medical History:  Diagnosis Date  . Anxiety   . Depression   . Diverticulosis   . GERD (gastroesophageal reflux disease)   . Internal hemorrhoids   . Thyroid disease   . Uterine fibroid     Past Surgical History:  Procedure Laterality Date  . ABDOMINAL HYSTERECTOMY     Per patient, uterine fibroids.  . ABDOMINAL HYSTERECTOMY    . CHOLECYSTECTOMY N/A 11/06/2014   Procedure: LAPAROSCOPIC CHOLECYSTECTOMY;  Surgeon: Jamesetta So, MD;  Location: [facility];  Service: General;  Laterality: N/A;  . COLONOSCOPY N/A 03/04/2017   Procedure: COLONOSCOPY;  Surgeon: Daneil Dolin, MD;  Location: [facility];  Service: Endoscopy;  Laterality: N/A;  8:30am  . DILATION AND CURETTAGE OF UTERUS    . ESOPHAGOGASTRODUODENOSCOPY N/A 10/02/2014   Procedure: ESOPHAGOGASTRODUODENOSCOPY (EGD);  Surgeon: Daneil Dolin, MD;  Location: [facility];  Service: Endoscopy;  Laterality: N/A;  945am - moved to 8:45 - Ginger notified pt  . MOUTH SURGERY     extraction of teeth  . POLYPECTOMY  03/04/2017   Procedure: POLYPECTOMY;  Surgeon: Daneil Dolin, MD;  Location: [facility];  Service: Endoscopy;;  colon  . TUBAL LIGATION      Social History   Socioeconomic History  . Marital status: Married    Spouse name: Not on file  . Number of children: Not on file  . Years of education: Not on file  . Highest education level: Not on file  Occupational History  . Not on file  Social Needs  . Financial resource strain: Not on file  . Food insecurity:    Worry: Not on file    Inability: Not  on file  . Transportation needs:    Medical: Not on file    Non-medical: Not on file  Tobacco Use  . Smoking status: Current Every Day Smoker    Packs/day: 0.50    Years: 3.00    Pack years: 1.50    Types: Cigarettes    Last attempt to quit: 11/03/1997    Years since quitting: 20.8  . Smokeless tobacco: Never Used  . Tobacco comment: 1 pack 1 week  Substance and Sexual Activity  . Alcohol use: No    Alcohol/week: 0.0 standard drinks  . Drug use: No  . Sexual activity: Yes    Birth control/protection: Surgical  Lifestyle  . Physical activity:    Days per week: Not on file    Minutes per session: Not on file  . Stress: Not on file  Relationships  . Social connections:    Talks on phone: Not on file    Gets together: Not on file    Attends religious service: Not on file    Active member of club or organization: Not on file    Attends meetings of clubs or organizations: Not on file    Relationship status: Not on file  . Intimate partner violence:    Fear of current or ex partner: Not on file  Emotionally abused: Not on file    Physically abused: Not on file    Forced sexual activity: Not on file  Other Topics Concern  . Not on file  Social History Narrative  . Not on file    Current Outpatient Medications on File Prior to Visit  Medication Sig Dispense Refill  . cholecalciferol (VITAMIN D) 1000 units tablet Take 1 tablet (1,000 Units total) by mouth daily. (Patient taking differently: Take 5,000 Units by mouth daily. ) 90 tablet 0  . diphenhydrAMINE (BENADRYL) 25 mg capsule Take 50 mg by mouth at bedtime as needed for allergies or sleep.    Marland Kitchen escitalopram (LEXAPRO) 20 MG tablet Take 1 tablet (20 mg total) by mouth at bedtime. 30 tablet 5  . methimazole (TAPAZOLE) 10 MG tablet Take 1 tablet (10 mg total) by mouth 2 (two) times daily. 60 tablet 0  . metoprolol tartrate (LOPRESSOR) 25 MG tablet Take 0.5 tablets (12.5 mg total) by mouth daily. 30 tablet 3  . Multiple  Vitamin (MULTIVITAMIN WITH MINERALS) TABS Take 1 tablet by mouth daily.    . Omega-3 Fatty Acids (FISH OIL) 1200 MG CAPS Take 1 capsule by mouth at bedtime.     . pantoprazole (PROTONIX) 40 MG tablet Take 40 mg by mouth daily.    . simethicone (PHAZYME) 125 MG chewable tablet Chew 125 mg by mouth every 6 (six) hours as needed for flatulence.    . Wheat Dextrin (BENEFIBER DRINK MIX PO) Take 1 Package by mouth daily.      No current facility-administered medications on file prior to visit.     Allergies  Allergen Reactions  . Levofloxacin Other (See Comments)    Pt can not have due to taking Lexapro   . Lisinopril Swelling  . Penicillins Other (See Comments)    Loopy Has patient had a PCN reaction causing immediate rash, facial/tongue/throat swelling, SOB or lightheadedness with hypotension: No Has patient had a PCN reaction causing severe rash involving mucus membranes or skin necrosis: No Has patient had a PCN reaction that required hospitalization: No Has patient had a PCN reaction occurring within the last 10 years: No If all of the above answers are "NO", then may proceed with Cephalosporin use.  "knocked out.      Family History  Problem Relation Age of Onset  . Hypertension Father   . Diabetes Father   . Depression Other   . Uterine cancer Mother        ?cervical cancer  . Uterine cancer Sister        suicide  . Colon cancer Paternal Aunt 35  . Thyroid disease Neg Hx     BP 130/88 (BP Location: Left Arm, Patient Position: Sitting, Cuff Size: Normal)   Pulse 83   Ht 5\' 3"  (1.6 m)   Wt 242 lb 9.6 oz (110 kg)   LMP 10/09/2014   SpO2 97%   BMI 42.97 kg/m    Review of Systems Denies fever    Objective:   Physical Exam VITAL SIGNS:  See vs page GENERAL: no distress NECK: There is no palpable thyroid enlargement.  No thyroid nodule is palpable.  No palpable lymphadenopathy at the anterior neck.   Lab Results  Component Value Date   TSH <0.01 Repeated and  verified X2. (L) 09/02/2018      Assessment & Plan:  Hyperthyroidism: due for recheck.  HTN: well-controlled.  Please continue the same medication   Patient Instructions  blood tests are requested for  you today.  Please have done is 1 week.  We'll let you know about the results.   After the surgery, please come back for a follow-up appointment 1-2 weeks later.

## 2018-09-09 NOTE — Patient Instructions (Addendum)
blood tests are requested for you today.  Please have done is 1 week.  We'll let you know about the results.   After the surgery, please come back for a follow-up appointment 1-2 weeks later.

## 2018-09-10 DIAGNOSIS — M25552 Pain in left hip: Secondary | ICD-10-CM | POA: Diagnosis not present

## 2018-09-10 DIAGNOSIS — F419 Anxiety disorder, unspecified: Secondary | ICD-10-CM | POA: Diagnosis not present

## 2018-09-10 DIAGNOSIS — M549 Dorsalgia, unspecified: Secondary | ICD-10-CM | POA: Diagnosis not present

## 2018-09-10 DIAGNOSIS — W010XXA Fall on same level from slipping, tripping and stumbling without subsequent striking against object, initial encounter: Secondary | ICD-10-CM | POA: Diagnosis not present

## 2018-09-10 DIAGNOSIS — K219 Gastro-esophageal reflux disease without esophagitis: Secondary | ICD-10-CM | POA: Diagnosis not present

## 2018-09-10 DIAGNOSIS — E0500 Thyrotoxicosis with diffuse goiter without thyrotoxic crisis or storm: Secondary | ICD-10-CM | POA: Diagnosis not present

## 2018-09-10 DIAGNOSIS — M25562 Pain in left knee: Secondary | ICD-10-CM | POA: Diagnosis not present

## 2018-09-10 DIAGNOSIS — S73102A Unspecified sprain of left hip, initial encounter: Secondary | ICD-10-CM | POA: Diagnosis not present

## 2018-09-10 DIAGNOSIS — S8992XA Unspecified injury of left lower leg, initial encounter: Secondary | ICD-10-CM | POA: Diagnosis not present

## 2018-09-10 DIAGNOSIS — S4992XA Unspecified injury of left shoulder and upper arm, initial encounter: Secondary | ICD-10-CM | POA: Diagnosis not present

## 2018-09-10 DIAGNOSIS — S79912A Unspecified injury of left hip, initial encounter: Secondary | ICD-10-CM | POA: Diagnosis not present

## 2018-09-10 DIAGNOSIS — Z79899 Other long term (current) drug therapy: Secondary | ICD-10-CM | POA: Diagnosis not present

## 2018-09-10 DIAGNOSIS — M25512 Pain in left shoulder: Secondary | ICD-10-CM | POA: Diagnosis not present

## 2018-09-10 DIAGNOSIS — F17200 Nicotine dependence, unspecified, uncomplicated: Secondary | ICD-10-CM | POA: Diagnosis not present

## 2018-09-10 DIAGNOSIS — S40012A Contusion of left shoulder, initial encounter: Secondary | ICD-10-CM | POA: Diagnosis not present

## 2018-09-11 DIAGNOSIS — F17200 Nicotine dependence, unspecified, uncomplicated: Secondary | ICD-10-CM | POA: Diagnosis not present

## 2018-09-11 DIAGNOSIS — Z79899 Other long term (current) drug therapy: Secondary | ICD-10-CM | POA: Diagnosis not present

## 2018-09-11 DIAGNOSIS — K219 Gastro-esophageal reflux disease without esophagitis: Secondary | ICD-10-CM | POA: Diagnosis not present

## 2018-09-11 DIAGNOSIS — R42 Dizziness and giddiness: Secondary | ICD-10-CM | POA: Diagnosis not present

## 2018-09-11 DIAGNOSIS — Z880 Allergy status to penicillin: Secondary | ICD-10-CM | POA: Diagnosis not present

## 2018-09-14 ENCOUNTER — Other Ambulatory Visit (INDEPENDENT_AMBULATORY_CARE_PROVIDER_SITE_OTHER): Payer: BLUE CROSS/BLUE SHIELD

## 2018-09-14 ENCOUNTER — Other Ambulatory Visit: Payer: Self-pay | Admitting: Endocrinology

## 2018-09-14 DIAGNOSIS — E0590 Thyrotoxicosis, unspecified without thyrotoxic crisis or storm: Secondary | ICD-10-CM

## 2018-09-14 LAB — T4, FREE: Free T4: 0.7 ng/dL (ref 0.60–1.60)

## 2018-09-14 LAB — T3, FREE: T3 FREE: 4 pg/mL (ref 2.3–4.2)

## 2018-09-14 LAB — TSH

## 2018-09-14 MED ORDER — METHIMAZOLE 10 MG PO TABS: 20.0000 mg | ORAL_TABLET | Freq: Two times a day (BID) | ORAL | 0 refills | Status: DC

## 2018-09-23 NOTE — Progress Notes (Signed)
09/14/2018- labs- noted in Epic- T4 Free, TSH, T3 Free  02/15/2018- noted in Epic-EKG  02/06/2018- noted in Epic-CXR

## 2018-09-23 NOTE — Patient Instructions (Addendum)
GENORA ARP  09/23/2018   Your procedure is scheduled on: Thursday 09/30/2018  Report to [facility] Main  Entrance              Report to admitting at  1030  AM    Call this number if you have problems the morning of surgery [phone]    Remember: Do not eat food or drink liquids :After Midnight.              BRUSH YOUR TEETH MORNING OF SURGERY AND RINSE YOUR MOUTH OUT, NO CHEWING GUM CANDY OR MINTS.     Take these medicines the morning of surgery with A SIP OF WATER: Methimazole (Tapazole)                                You may not have any metal on your body including hair pins and              piercings  Do not wear jewelry, make-up, lotions, powders or perfumes, deodorant             Do not wear nail polish.  Do not shave  48 hours prior to surgery.               Do not bring valuables to the hospital. [place].  Contacts, dentures or bridgework may not be worn into surgery.  Leave suitcase in the car. After surgery it may be brought to your room.                  Please read over the following fact sheets you were given: _____________________________________________________________________             [facility] - Preparing for Surgery Before surgery, you can play an important role.  Because skin is not sterile, your skin needs to be as free of germs as possible.  You can reduce the number of germs on your skin by washing with CHG (chlorahexidine gluconate) soap before surgery.  CHG is an antiseptic cleaner which kills germs and bonds with the skin to continue killing germs even after washing. Please DO NOT use if you have an allergy to CHG or antibacterial soaps.  If your skin becomes reddened/irritated stop using the CHG and inform your nurse when you arrive at Short Stay. Do not shave (including legs and underarms) for at least 48 hours prior to the first CHG shower.  You may shave your  face/neck. Please follow these instructions carefully:  1.  Shower with CHG Soap the night before surgery and the  morning of Surgery.  2.  If you choose to wash your hair, wash your hair first as usual with your  normal  shampoo.  3.  After you shampoo, rinse your hair and body thoroughly to remove the  shampoo.                           4.  Use CHG as you would any other liquid soap.  You can apply chg directly  to the skin and wash  Gently with a scrungie or clean washcloth.  5.  Apply the CHG Soap to your body ONLY FROM THE NECK DOWN.   Do not use on face/ open                           Wound or open sores. Avoid contact with eyes, ears mouth and genitals (private parts).                       Wash face,  Genitals (private parts) with your normal soap.             6.  Wash thoroughly, paying special attention to the area where your surgery  will be performed.  7.  Thoroughly rinse your body with warm water from the neck down.  8.  DO NOT shower/wash with your normal soap after using and rinsing off  the CHG Soap.                9.  Pat yourself dry with a clean towel.            10.  Wear clean pajamas.            11.  Place clean sheets on your bed the night of your first shower and do not  sleep with pets. Day of Surgery : Do not apply any lotions/deodorants the morning of surgery.  Please wear clean clothes to the hospital/surgery center.  FAILURE TO FOLLOW THESE INSTRUCTIONS MAY RESULT IN THE CANCELLATION OF YOUR SURGERY PATIENT SIGNATURE_________________________________  NURSE SIGNATURE__________________________________  ________________________________________________________________________

## 2018-09-25 DIAGNOSIS — M545 Low back pain: Secondary | ICD-10-CM | POA: Diagnosis not present

## 2018-09-25 DIAGNOSIS — J069 Acute upper respiratory infection, unspecified: Secondary | ICD-10-CM | POA: Diagnosis not present

## 2018-09-25 DIAGNOSIS — K5900 Constipation, unspecified: Secondary | ICD-10-CM | POA: Diagnosis not present

## 2018-09-25 DIAGNOSIS — Z720 Tobacco use: Secondary | ICD-10-CM | POA: Diagnosis not present

## 2018-09-25 DIAGNOSIS — Z90711 Acquired absence of uterus with remaining cervical stump: Secondary | ICD-10-CM | POA: Diagnosis not present

## 2018-09-25 DIAGNOSIS — H9203 Otalgia, bilateral: Secondary | ICD-10-CM | POA: Diagnosis not present

## 2018-09-25 DIAGNOSIS — Z9851 Tubal ligation status: Secondary | ICD-10-CM | POA: Diagnosis not present

## 2018-09-25 DIAGNOSIS — Z881 Allergy status to other antibiotic agents status: Secondary | ICD-10-CM | POA: Diagnosis not present

## 2018-09-25 DIAGNOSIS — Z9049 Acquired absence of other specified parts of digestive tract: Secondary | ICD-10-CM | POA: Diagnosis not present

## 2018-09-25 DIAGNOSIS — Z880 Allergy status to penicillin: Secondary | ICD-10-CM | POA: Diagnosis not present

## 2018-09-25 DIAGNOSIS — K219 Gastro-esophageal reflux disease without esophagitis: Secondary | ICD-10-CM | POA: Diagnosis not present

## 2018-09-25 DIAGNOSIS — F17210 Nicotine dependence, cigarettes, uncomplicated: Secondary | ICD-10-CM | POA: Diagnosis not present

## 2018-09-25 DIAGNOSIS — Z79899 Other long term (current) drug therapy: Secondary | ICD-10-CM | POA: Diagnosis not present

## 2018-09-25 DIAGNOSIS — F419 Anxiety disorder, unspecified: Secondary | ICD-10-CM | POA: Diagnosis not present

## 2018-09-25 DIAGNOSIS — E0590 Thyrotoxicosis, unspecified without thyrotoxic crisis or storm: Secondary | ICD-10-CM | POA: Diagnosis not present

## 2018-09-26 ENCOUNTER — Encounter (HOSPITAL_COMMUNITY): Payer: Self-pay | Admitting: Surgery

## 2018-09-26 DIAGNOSIS — E0500 Thyrotoxicosis with diffuse goiter without thyrotoxic crisis or storm: Secondary | ICD-10-CM | POA: Diagnosis not present

## 2018-09-26 DIAGNOSIS — Z79899 Other long term (current) drug therapy: Secondary | ICD-10-CM | POA: Diagnosis not present

## 2018-09-26 DIAGNOSIS — Z7952 Long term (current) use of systemic steroids: Secondary | ICD-10-CM | POA: Diagnosis not present

## 2018-09-26 DIAGNOSIS — R000 Tachycardia, unspecified: Secondary | ICD-10-CM | POA: Diagnosis not present

## 2018-09-26 DIAGNOSIS — T7840XA Allergy, unspecified, initial encounter: Secondary | ICD-10-CM | POA: Diagnosis not present

## 2018-09-26 DIAGNOSIS — T782XXA Anaphylactic shock, unspecified, initial encounter: Secondary | ICD-10-CM | POA: Diagnosis not present

## 2018-09-26 DIAGNOSIS — I959 Hypotension, unspecified: Secondary | ICD-10-CM | POA: Diagnosis not present

## 2018-09-26 NOTE — H&P (Signed)
General Surgery [facility]  GAYLA BENN DOB: 1976/09/28 Married / Language: English / Race: White Female   History of Present Illness   The patient is a 42 year old female who presents with hyperthyroidism.  CHIEF COMPLAINT: hyperthyroidism, suspected Graves' disease  Patient is referred by Dr. Renato Shin for surgical evaluation and management of hyperthyroidism and suspected Graves' disease. Patient's primary care physician is Dr. Redmond School. Patient has a history of hyperthyroidism dating back over 2 years. She has been treated in the past with beta-blockade and has been on methimazole for the past 4 months. She has experienced tremors and palpitations. She also complains of headache and nausea. Patient has had nuclear medicine uptake scans which are normal and do not show any focal lesions. She has not had a dedicated thyroid ultrasound. Patient has had no prior head or neck surgery. There is no family history of thyroid disease or thyroid neoplasms. Patient is accompanied by her husband. Patient has discussed treatment options for management of hyperthyroidism with her endocrinologist. She initially was interested radioactive iodine treatment, but after discussion with her husband and doing some personal research, she has decided she would prefer to pursue surgery.   Past Surgical History  Gallbladder Surgery - Laparoscopic   Diagnostic Studies History  Colonoscopy  within last year Mammogram  within last year Pap Smear  1-5 years ago  Allergies Penicillins  Allergies Reconciled   Medication History  Escitalopram Oxalate (20MG  Tablet, Oral) Active. methIMAzole (5MG  Tablet, Oral) Active. Metoprolol Tartrate (25MG  Tablet, Oral) Active. Pantoprazole Sodium (40MG  Tablet DR, Oral) Active. Medications Reconciled  Social History  Caffeine use  Carbonated beverages. No alcohol use  Tobacco use  Current some day  smoker.  Family History Alcohol Abuse  Sister. Arthritis  Father. Heart disease in female family member before age 34  Ovarian Cancer  Mother.  Pregnancy / Birth History Age at menarche  30 years. Maternal age  26-20 Para  2  Other Problems  Anxiety Disorder  Diverticulosis  Gastroesophageal Reflux Disease  Thyroid Disease   Review of Systems General Present- Fatigue. Not Present- Appetite Loss, Chills, Fever, Night Sweats, Weight Gain and Weight Loss. Skin Present- Dryness. Not Present- Change in Wart/Mole, Hives, Jaundice, New Lesions, Non-Healing Wounds, Rash and Ulcer. HEENT Present- Sinus Pain and Wears glasses/contact lenses. Not Present- Earache, Hearing Loss, Hoarseness, Nose Bleed, Oral Ulcers, Ringing in the Ears, Seasonal Allergies, Sore Throat, Visual Disturbances and Yellow Eyes. Gastrointestinal Present- Abdominal Pain. Not Present- Bloating, Bloody Stool, Change in Bowel Habits, Chronic diarrhea, Constipation, Difficulty Swallowing, Excessive gas, Gets full quickly at meals, Hemorrhoids, Indigestion, Nausea, Rectal Pain and Vomiting. Psychiatric Present- Change in Sleep Pattern. Not Present- Anxiety, Bipolar, Depression, Fearful and Frequent crying. Endocrine Present- Cold Intolerance, Heat Intolerance and Hot flashes. Not Present- Excessive Hunger, Hair Changes and New Diabetes.  Vitals  Weight: 236.25 lb Height: 63in Body Surface Area: 2.08 m Body Mass Index: 41.85 kg/m  Temp.: 98.32F  Pulse: 95 (Regular)  BP: 158/78 (Sitting, Left Arm, Standard)  Physical Exam   See vital signs recorded above  GENERAL APPEARANCE Development: normal Nutritional status: normal Gross deformities: none  SKIN Rash, lesions, ulcers: none Induration, erythema: none Nodules: none palpable  EYES Conjunctiva and lids: normal Pupils: equal and reactive Iris: normal bilaterally  EARS, NOSE, MOUTH, THROAT External ears: no lesion or deformity External  nose: no lesion or deformity Hearing: grossly normal Lips: no lesion or deformity Dentition: normal for age Oral mucosa: moist  NECK Symmetric: yes Trachea: midline Thyroid: no palpable nodules in the thyroid bed  CHEST Respiratory effort: normal Retraction or accessory muscle use: no Breath sounds: normal bilaterally Rales, rhonchi, wheeze: none  CARDIOVASCULAR Auscultation: regular rhythm, normal rate Murmurs: none Pulses: carotid and radial pulse 2+ palpable Lower extremity edema: none Lower extremity varicosities: none  MUSCULOSKELETAL Station and gait: normal Digits and nails: no clubbing or cyanosis Muscle strength: grossly normal all extremities Range of motion: grossly normal all extremities Deformity: none  LYMPHATIC Cervical: none palpable Supraclavicular: none palpable  PSYCHIATRIC Oriented to person, place, and time: yes Mood and affect: normal for situation Judgment and insight: appropriate for situation    Assessment & Plan  HYPERTHYROIDISM (E05.90)  Pt Education - Pamphlet Given - The Thyroid Book: discussed with patient and provided information. Patient is referred by her endocrinologist for consideration for thyroidectomy for management of hyperthyroidism and suspected Graves' disease. Patient is provided with written literature on thyroid surgery to review at home. She is accompanied today by her husband.  We discussed total thyroidectomy for the management of hyperthyroidism. We discussed risk and benefits of the procedure including the potential for recurrent laryngeal nerve injury and changes in voice quality and the potential for injury to parathyroid glands resulting in hypocalcemia. We discussed the location of the surgical incision. We discussed the hospital stay to be anticipated. We discussed postoperative recovery and need for lifelong thyroid hormone replacement.  Patient has not had an imaging study of her thyroid other than the  nuclear medicine scan. I would like to obtain an ultrasound examination of the thyroid in order to better define her anatomy prior to surgery. This should be done within the next week or so.  The risks and benefits of the procedure have been discussed at length with the patient. The patient understands the proposed procedure, potential alternative treatments, and the course of recovery to be expected. All of the patient's questions have been answered at this time. The patient wishes to proceed with surgery.  Armandina Gemma, [place] [facility] Office: [phone]

## 2018-09-28 ENCOUNTER — Encounter (HOSPITAL_COMMUNITY)
Admission: RE | Admit: 2018-09-28 | Discharge: 2018-09-28 | Disposition: A | Discharge status: Home / Self Care | Payer: BLUE CROSS/BLUE SHIELD | Source: Ambulatory Visit | Attending: Surgery | Admitting: Surgery

## 2018-09-28 ENCOUNTER — Encounter (HOSPITAL_COMMUNITY): Payer: Self-pay

## 2018-09-28 ENCOUNTER — Other Ambulatory Visit: Payer: Self-pay

## 2018-09-28 DIAGNOSIS — Z01818 Encounter for other preprocedural examination: Secondary | ICD-10-CM | POA: Diagnosis not present

## 2018-09-28 DIAGNOSIS — E0500 Thyrotoxicosis with diffuse goiter without thyrotoxic crisis or storm: Secondary | ICD-10-CM | POA: Insufficient documentation

## 2018-09-28 HISTORY — DX: Thyrotoxicosis with diffuse goiter without thyrotoxic crisis or storm: E05.00

## 2018-09-28 HISTORY — DX: Unspecified osteoarthritis, unspecified site: M19.90

## 2018-09-28 LAB — BASIC METABOLIC PANEL
Anion gap: 10 (ref 5–15)
BUN: 11 mg/dL (ref 6–20)
CO2: 24 mmol/L (ref 22–32)
CREATININE: 0.79 mg/dL (ref 0.44–1.00)
Calcium: 9.2 mg/dL (ref 8.9–10.3)
Chloride: 104 mmol/L (ref 98–111)
GFR calc Af Amer: >60 mL/min (ref >60)
GFR calc non Af Amer: >60 mL/min (ref >60)
Glucose, Bld: 111 mg/dL — ABNORMAL HIGH (ref 70–99)
Potassium: 4.7 mmol/L (ref 3.5–5.1)
Sodium: 138 mmol/L (ref 135–145)

## 2018-09-28 LAB — CBC
HCT: 41.5 % (ref 36.0–46.0)
Hemoglobin: 13.7 g/dL (ref 12.0–15.0)
MCH: 29.8 pg (ref 26.0–34.0)
MCHC: 33 g/dL (ref 30.0–36.0)
MCV: 90.4 fL (ref 80.0–100.0)
Platelets: 260 10*3/uL (ref 150–400)
RBC: 4.59 MIL/uL (ref 3.87–5.11)
RDW: 12.8 % (ref 11.5–15.5)
WBC: 9.5 10*3/uL (ref 4.0–10.5)
nRBC: 0 % (ref 0.0–0.2)

## 2018-09-28 NOTE — Progress Notes (Signed)
   09/28/18 1316  OBSTRUCTIVE SLEEP APNEA  Have you ever been diagnosed with sleep apnea through a sleep study? No  Do you snore loudly (loud enough to be heard through closed doors)?  1  Do you often feel tired, fatigued, or sleepy during the daytime (such as falling asleep during driving or talking to someone)? 1  Has anyone observed you stop breathing during your sleep? 1  Do you have, or are you being treated for high blood pressure? 0  BMI more than 35 kg/m2? 1  Age > 50 (1-yes) 0  Neck circumference greater than:Female 16 inches or larger, Female 17inches or larger? 1  Female Gender (Yes=1) 0  Obstructive Sleep Apnea Score 5

## 2018-09-29 NOTE — Anesthesia Preprocedure Evaluation (Addendum)
Anesthesia Evaluation  Patient identified by MRN, date of birth, ID band Patient awake    Reviewed: Allergy & Precautions, NPO status , Patient's Chart, lab work & pertinent test results  Airway Mallampati: II  TM Distance: >3 FB Neck ROM: Full    Dental no notable dental hx. (+) Missing, Poor Dentition,    Pulmonary neg pulmonary ROS, Current Smoker,    Pulmonary exam normal breath sounds clear to auscultation       Cardiovascular hypertension, Pt. on medications and Pt. on home beta blockers Normal cardiovascular exam Rhythm:Regular Rate:Normal     Neuro/Psych negative psych ROS   GI/Hepatic Neg liver ROS, GERD  Medicated,  Endo/Other  Hyperthyroidism   Renal/GU negative Renal ROS     Musculoskeletal   Abdominal (+) + obese,   Peds  Hematology negative hematology ROS (+)   Anesthesia Other Findings   Reproductive/Obstetrics                            Lab Results  Component Value Date   WBC 9.5 09/28/2018   HGB 13.7 09/28/2018   HCT 41.5 09/28/2018   MCV 90.4 09/28/2018   PLT 260 09/28/2018   Lab Results  Component Value Date   CREATININE 0.79 09/28/2018   BUN 11 09/28/2018   NA 138 09/28/2018   K 4.7 09/28/2018   CL 104 09/28/2018   CO2 24 09/28/2018     Anesthesia Physical Anesthesia Plan  ASA: III  Anesthesia Plan: General   Post-op Pain Management:    Induction: Intravenous  PONV Risk Score and Plan: 2 and Treatment may vary due to age or medical condition, Dexamethasone and Ondansetron  Airway Management Planned: Oral ETT  Additional Equipment:   Intra-op Plan:   Post-operative Plan: Extubation in OR  Informed Consent: I have reviewed the patients History and Physical, chart, labs and discussed the procedure including the risks, benefits and alternatives for the proposed anesthesia with the patient or authorized representative who has indicated his/her  understanding and acceptance.   Dental advisory given  Plan Discussed with: CRNA  Anesthesia Plan Comments: (Pt did not take her Metoprolo this AM)       Anesthesia Quick Evaluation

## 2018-09-30 ENCOUNTER — Other Ambulatory Visit: Payer: Self-pay

## 2018-09-30 ENCOUNTER — Ambulatory Visit (HOSPITAL_COMMUNITY): Payer: BLUE CROSS/BLUE SHIELD | Admitting: Anesthesiology

## 2018-09-30 ENCOUNTER — Encounter (HOSPITAL_COMMUNITY)
Admission: RE | Disposition: A | Discharge status: Home / Self Care | Payer: Self-pay | Source: Home / Self Care | Attending: Surgery

## 2018-09-30 ENCOUNTER — Encounter (HOSPITAL_COMMUNITY): Payer: Self-pay | Admitting: Emergency Medicine

## 2018-09-30 ENCOUNTER — Observation Stay (HOSPITAL_COMMUNITY)
Admission: RE | Admit: 2018-09-30 | Discharge: 2018-10-01 | Disposition: A | Discharge status: Home / Self Care | Payer: BLUE CROSS/BLUE SHIELD | Attending: Surgery | Admitting: Surgery

## 2018-09-30 DIAGNOSIS — E0590 Thyrotoxicosis, unspecified without thyrotoxic crisis or storm: Secondary | ICD-10-CM | POA: Diagnosis not present

## 2018-09-30 DIAGNOSIS — E049 Nontoxic goiter, unspecified: Secondary | ICD-10-CM | POA: Diagnosis not present

## 2018-09-30 DIAGNOSIS — E0520 Thyrotoxicosis with toxic multinodular goiter without thyrotoxic crisis or storm: Secondary | ICD-10-CM | POA: Diagnosis not present

## 2018-09-30 DIAGNOSIS — Z79899 Other long term (current) drug therapy: Secondary | ICD-10-CM | POA: Insufficient documentation

## 2018-09-30 DIAGNOSIS — K219 Gastro-esophageal reflux disease without esophagitis: Secondary | ICD-10-CM | POA: Diagnosis not present

## 2018-09-30 DIAGNOSIS — F17200 Nicotine dependence, unspecified, uncomplicated: Secondary | ICD-10-CM | POA: Diagnosis not present

## 2018-09-30 DIAGNOSIS — F419 Anxiety disorder, unspecified: Secondary | ICD-10-CM | POA: Insufficient documentation

## 2018-09-30 DIAGNOSIS — E041 Nontoxic single thyroid nodule: Secondary | ICD-10-CM | POA: Diagnosis not present

## 2018-09-30 DIAGNOSIS — E0500 Thyrotoxicosis with diffuse goiter without thyrotoxic crisis or storm: Secondary | ICD-10-CM | POA: Diagnosis not present

## 2018-09-30 HISTORY — PX: THYROIDECTOMY: SHX17

## 2018-09-30 SURGERY — THYROIDECTOMY
Anesthesia: General | Site: Neck

## 2018-09-30 MED ORDER — OXYCODONE HCL 5 MG PO TABS: 5.0000 mg | ORAL_TABLET | Freq: Once | ORAL | Status: DC | PRN

## 2018-09-30 MED ORDER — DEXAMETHASONE SODIUM PHOSPHATE 10 MG/ML IJ SOLN
INTRAMUSCULAR | Status: DC | PRN
  Administered 2018-09-30: 10 mg via INTRAVENOUS

## 2018-09-30 MED ORDER — CHLORHEXIDINE GLUCONATE CLOTH 2 % EX PADS: 6.0000 | MEDICATED_PAD | Freq: Once | CUTANEOUS | Status: DC

## 2018-09-30 MED ORDER — TRAMADOL HCL 50 MG PO TABS
50.0000 mg | ORAL_TABLET | Freq: Four times a day (QID) | ORAL | Status: DC | PRN
  Administered 2018-09-30 – 2018-10-01 (×2): 50 mg via ORAL
  Filled 2018-09-30 (×2): qty 1

## 2018-09-30 MED ORDER — PROPOFOL 10 MG/ML IV BOLUS
INTRAVENOUS | Status: AC
  Filled 2018-09-30: qty 20

## 2018-09-30 MED ORDER — HYDROMORPHONE HCL 1 MG/ML IJ SOLN: 1.0000 mg | INTRAMUSCULAR | Status: DC | PRN

## 2018-09-30 MED ORDER — VANCOMYCIN HCL 10 G IV SOLR
1500.0000 mg | INTRAVENOUS | Status: AC
  Administered 2018-09-30: 1500 mg via INTRAVENOUS
  Filled 2018-09-30: qty 1500

## 2018-09-30 MED ORDER — ESCITALOPRAM OXALATE 20 MG PO TABS
20.0000 mg | ORAL_TABLET | Freq: Every day | ORAL | Status: DC
  Administered 2018-09-30: 20 mg via ORAL
  Filled 2018-09-30: qty 1

## 2018-09-30 MED ORDER — METOCLOPRAMIDE HCL 5 MG/ML IJ SOLN
INTRAMUSCULAR | Status: DC | PRN
  Administered 2018-09-30: 10 mg via INTRAVENOUS

## 2018-09-30 MED ORDER — MEPERIDINE HCL 50 MG/ML IJ SOLN: 6.2500 mg | INTRAMUSCULAR | Status: DC | PRN

## 2018-09-30 MED ORDER — DIPHENHYDRAMINE HCL 25 MG PO CAPS: 25.0000 mg | ORAL_CAPSULE | Freq: Four times a day (QID) | ORAL | Status: DC | PRN

## 2018-09-30 MED ORDER — SUGAMMADEX SODIUM 500 MG/5ML IV SOLN
INTRAVENOUS | Status: DC | PRN
  Administered 2018-09-30: 100 mg via INTRAVENOUS

## 2018-09-30 MED ORDER — LIDOCAINE HCL (CARDIAC) PF 100 MG/5ML IV SOSY
PREFILLED_SYRINGE | INTRAVENOUS | Status: DC | PRN
  Administered 2018-09-30: 100 mg via INTRAVENOUS

## 2018-09-30 MED ORDER — FENTANYL CITRATE (PF) 100 MCG/2ML IJ SOLN
INTRAMUSCULAR | Status: AC
  Filled 2018-09-30: qty 2

## 2018-09-30 MED ORDER — HYDROMORPHONE HCL 1 MG/ML IJ SOLN
INTRAMUSCULAR | Status: AC
  Filled 2018-09-30: qty 1

## 2018-09-30 MED ORDER — KCL IN DEXTROSE-NACL 20-5-0.45 MEQ/L-%-% IV SOLN
INTRAVENOUS | Status: DC
  Administered 2018-09-30: 16:00:00 via INTRAVENOUS
  Filled 2018-09-30: qty 1000

## 2018-09-30 MED ORDER — HYDROCODONE-ACETAMINOPHEN 5-325 MG PO TABS: 1.0000 | ORAL_TABLET | ORAL | Status: DC | PRN

## 2018-09-30 MED ORDER — ONDANSETRON HCL 4 MG/2ML IJ SOLN
INTRAMUSCULAR | Status: DC | PRN
  Administered 2018-09-30: 4 mg via INTRAVENOUS

## 2018-09-30 MED ORDER — LACTATED RINGERS IV SOLN
INTRAVENOUS | Status: DC
  Administered 2018-09-30 (×2): via INTRAVENOUS

## 2018-09-30 MED ORDER — SCOPOLAMINE 1 MG/3DAYS TD PT72
1.0000 | MEDICATED_PATCH | Freq: Once | TRANSDERMAL | Status: DC
  Administered 2018-09-30: 1.5 mg via TRANSDERMAL
  Filled 2018-09-30: qty 1

## 2018-09-30 MED ORDER — KETOROLAC TROMETHAMINE 30 MG/ML IJ SOLN: 30.0000 mg | Freq: Once | INTRAMUSCULAR | Status: DC | PRN

## 2018-09-30 MED ORDER — ACETAMINOPHEN 650 MG RE SUPP: 650.0000 mg | Freq: Four times a day (QID) | RECTAL | Status: DC | PRN

## 2018-09-30 MED ORDER — PROPOFOL 10 MG/ML IV BOLUS
INTRAVENOUS | Status: DC | PRN
  Administered 2018-09-30 (×3): 25 mg via INTRAVENOUS
  Administered 2018-09-30: 200 mg via INTRAVENOUS

## 2018-09-30 MED ORDER — ONDANSETRON 4 MG PO TBDP: 4.0000 mg | ORAL_TABLET | Freq: Four times a day (QID) | ORAL | Status: DC | PRN

## 2018-09-30 MED ORDER — MIDAZOLAM HCL 2 MG/2ML IJ SOLN
INTRAMUSCULAR | Status: AC
  Filled 2018-09-30: qty 2

## 2018-09-30 MED ORDER — METOPROLOL TARTRATE 25 MG/10 ML ORAL SUSPENSION
6.2500 mg | Freq: Every evening | ORAL | Status: DC
  Filled 2018-09-30: qty 5

## 2018-09-30 MED ORDER — METOPROLOL TARTRATE 12.5 MG HALF TABLET
12.5000 mg | ORAL_TABLET | Freq: Every evening | ORAL | Status: DC
  Administered 2018-09-30: 12.5 mg via ORAL
  Filled 2018-09-30: qty 1

## 2018-09-30 MED ORDER — OXYCODONE HCL 5 MG/5ML PO SOLN
5.0000 mg | Freq: Once | ORAL | Status: DC | PRN
  Filled 2018-09-30: qty 5

## 2018-09-30 MED ORDER — ROCURONIUM BROMIDE 100 MG/10ML IV SOLN
INTRAVENOUS | Status: DC | PRN
  Administered 2018-09-30: 60 mg via INTRAVENOUS

## 2018-09-30 MED ORDER — ACETAMINOPHEN 325 MG PO TABS
650.0000 mg | ORAL_TABLET | Freq: Four times a day (QID) | ORAL | Status: DC | PRN
  Administered 2018-10-01: 650 mg via ORAL
  Filled 2018-09-30: qty 2

## 2018-09-30 MED ORDER — ONDANSETRON HCL 4 MG/2ML IJ SOLN: 4.0000 mg | Freq: Once | INTRAMUSCULAR | Status: DC | PRN

## 2018-09-30 MED ORDER — 0.9 % SODIUM CHLORIDE (POUR BTL) OPTIME
TOPICAL | Status: DC | PRN
  Administered 2018-09-30: 1000 mL

## 2018-09-30 MED ORDER — CALCIUM CARBONATE 1250 (500 CA) MG PO TABS
2.0000 | ORAL_TABLET | Freq: Three times a day (TID) | ORAL | Status: DC
  Administered 2018-09-30 – 2018-10-01 (×2): 1000 mg via ORAL
  Filled 2018-09-30 (×2): qty 1

## 2018-09-30 MED ORDER — PANTOPRAZOLE SODIUM 40 MG PO TBEC
40.0000 mg | DELAYED_RELEASE_TABLET | Freq: Every evening | ORAL | Status: DC
  Administered 2018-09-30: 40 mg via ORAL
  Filled 2018-09-30: qty 1

## 2018-09-30 MED ORDER — MIDAZOLAM HCL 5 MG/5ML IJ SOLN
INTRAMUSCULAR | Status: DC | PRN
  Administered 2018-09-30 (×2): 1 mg via INTRAVENOUS

## 2018-09-30 MED ORDER — ONDANSETRON HCL 4 MG/2ML IJ SOLN: 4.0000 mg | Freq: Four times a day (QID) | INTRAMUSCULAR | Status: DC | PRN

## 2018-09-30 MED ORDER — HYDROMORPHONE HCL 1 MG/ML IJ SOLN
0.2500 mg | INTRAMUSCULAR | Status: DC | PRN
  Administered 2018-09-30 (×2): 0.5 mg via INTRAVENOUS

## 2018-09-30 MED ORDER — METOPROLOL TARTRATE 25 MG PO TABS: 6.2500 mg | ORAL_TABLET | Freq: Every evening | ORAL | Status: DC

## 2018-09-30 MED ORDER — MECLIZINE HCL 25 MG PO TABS: 25.0000 mg | ORAL_TABLET | Freq: Three times a day (TID) | ORAL | Status: DC | PRN

## 2018-09-30 MED ORDER — FENTANYL CITRATE (PF) 100 MCG/2ML IJ SOLN
INTRAMUSCULAR | Status: DC | PRN
  Administered 2018-09-30: 50 ug via INTRAVENOUS
  Administered 2018-09-30: 100 ug via INTRAVENOUS

## 2018-09-30 SURGICAL SUPPLY — 27 items
ATTRACTOMAT 16X20 MAGNETIC DRP (DRAPES) ×3 IMPLANT
BLADE SURG 15 STRL LF DISP TIS (BLADE) ×1 IMPLANT
BLADE SURG 15 STRL SS (BLADE) ×2
CHLORAPREP W/TINT 26ML (MISCELLANEOUS) ×6 IMPLANT
CLIP VESOCCLUDE MED 6/CT (CLIP) ×6 IMPLANT
CLIP VESOCCLUDE SM WIDE 6/CT (CLIP) ×6 IMPLANT
COVER SURGICAL LIGHT HANDLE (MISCELLANEOUS) ×3 IMPLANT
COVER WAND RF STERILE (DRAPES) ×3 IMPLANT
DERMABOND ADVANCED (GAUZE/BANDAGES/DRESSINGS) ×2
DERMABOND ADVANCED .7 DNX12 (GAUZE/BANDAGES/DRESSINGS) IMPLANT
DRAPE LAPAROTOMY T 98X78 PEDS (DRAPES) ×3 IMPLANT
ELECT PENCIL ROCKER SW 15FT (MISCELLANEOUS) ×3 IMPLANT
ELECT REM PT RETURN 15FT ADLT (MISCELLANEOUS) ×3 IMPLANT
GAUZE 4X4 16PLY RFD (DISPOSABLE) ×3 IMPLANT
GLOVE SURG ORTHO 8.0 STRL STRW (GLOVE) ×3 IMPLANT
GOWN STRL REUS W/TWL XL LVL3 (GOWN DISPOSABLE) ×6 IMPLANT
HEMOSTAT SURGICEL 2X4 FIBR (HEMOSTASIS) ×2 IMPLANT
ILLUMINATOR WAVEGUIDE N/F (MISCELLANEOUS) ×2 IMPLANT
KIT BASIN OR (CUSTOM PROCEDURE TRAY) ×3 IMPLANT
PACK BASIC VI WITH GOWN DISP (CUSTOM PROCEDURE TRAY) ×3 IMPLANT
SHEARS HARMONIC 9CM CVD (BLADE) ×3 IMPLANT
SUT MNCRL AB 4-0 PS2 18 (SUTURE) ×3 IMPLANT
SUT VIC AB 3-0 SH 18 (SUTURE) ×6 IMPLANT
SYR BULB IRRIGATION 50ML (SYRINGE) ×3 IMPLANT
TOWEL OR 17X26 10 PK STRL BLUE (TOWEL DISPOSABLE) ×3 IMPLANT
TOWEL OR NON WOVEN STRL DISP B (DISPOSABLE) ×3 IMPLANT
YANKAUER SUCT BULB TIP 10FT TU (MISCELLANEOUS) ×3 IMPLANT

## 2018-09-30 NOTE — Interval H&P Note (Signed)
History and Physical Interval Note:  09/30/2018 11:38 AM  Heather Fry  has presented today for surgery, with the diagnosis of HYPERTHYROIDISM GRAVES DISEASE.  The various methods of treatment have been discussed with the patient and family. After consideration of risks, benefits and other options for treatment, the patient has consented to    Procedure(s): TOTAL THYROIDECTOMY (N/A) as a surgical intervention .    The patient's history has been reviewed, patient examined, no change in status, stable for surgery.  I have reviewed the patient's chart and labs.  Questions were answered to the patient's satisfaction.    Armandina Gemma, [place] [facility] Office: [place]

## 2018-09-30 NOTE — Anesthesia Postprocedure Evaluation (Signed)
Anesthesia Post Note  Patient: Heather Fry  Procedure(s) Performed: TOTAL THYROIDECTOMY (N/A Neck)     Patient location during evaluation: PACU Anesthesia Type: General Level of consciousness: awake and alert Pain management: pain level controlled Vital Signs Assessment: post-procedure vital signs reviewed and stable Respiratory status: spontaneous breathing, nonlabored ventilation, respiratory function stable and patient connected to nasal cannula oxygen Cardiovascular status: blood pressure returned to baseline and stable Postop Assessment: no apparent nausea or vomiting Anesthetic complications: no    Last Vitals:  Vitals:   09/30/18 1530 09/30/18 1548  BP: (!) 159/91 (!) 153/100  Pulse: (!) 111 (!) 112  Resp: 17 16  Temp: 36.6 C 37.2 C  SpO2: 98% 98%    Last Pain:  Vitals:   09/30/18 1548  TempSrc: Oral  PainSc: 2                  Barnet Glasgow

## 2018-09-30 NOTE — Progress Notes (Addendum)
Per Dr. Valma Cava, no metoprolol needed to be given prior to surgery. Pt takes at night only, took 6.25mg  last evening at 1900.

## 2018-09-30 NOTE — Anesthesia Procedure Notes (Signed)
Procedure Name: Intubation Date/Time: 09/30/2018 11:53 AM Performed by: Barnet Glasgow, MD Pre-anesthesia Checklist: Patient identified, Emergency Drugs available, Suction available, Patient being monitored and Timeout performed Patient Re-evaluated:Patient Re-evaluated prior to induction Oxygen Delivery Method: Circle system utilized Preoxygenation: Pre-oxygenation with 100% oxygen Induction Type: IV induction and Cricoid Pressure applied Ventilation: Mask ventilation without difficulty Laryngoscope Size: Mac and 4 Grade View: Grade III Tube type: Reinforced Tube size: 7.0 mm Number of attempts: 1 Airway Equipment and Method: Stylet Placement Confirmation: ETT inserted through vocal cords under direct vision and breath sounds checked- equal and bilateral Secured at: 21 cm Tube secured with: Tape Dental Injury: Teeth and Oropharynx as per pre-operative assessment  Difficulty Due To: Difficulty was anticipated, Difficult Airway- due to large tongue, Difficult Airway- due to reduced neck mobility, Difficult Airway- due to dentition and Difficult Airway- due to limited oral opening

## 2018-09-30 NOTE — Anesthesia Procedure Notes (Signed)
Performed by: Houser, Stephen A, MD       

## 2018-09-30 NOTE — Op Note (Signed)
Procedure Note  Pre-operative Diagnosis:  Hyperthyroidism, multiple thyroid nodules  Post-operative Diagnosis:  same  Surgeon:  Armandina Gemma, MD  Assistant:  none   Procedure:  Total thyroidectomy  Anesthesia:  General  Estimated Blood Loss:  minimal  Drains: none          Specimen: thyroid to pathology  Indications:  Patient is referred by Dr. Renato Shin for surgical evaluation and management of hyperthyroidism and suspected Graves' disease. Patient's primary care physician is Dr. Redmond School. Patient has a history of hyperthyroidism dating back over 2 years. She has been treated in the past with beta-blockade and has been on methimazole for the past 4 months. She has experienced tremors and palpitations. She also complains of headache and nausea. Patient has had nuclear medicine uptake scans which are normal and do not show any focal lesions. She has not had a dedicated thyroid ultrasound. Patient has had no prior head or neck surgery. There is no family history of thyroid disease or thyroid neoplasms. Patient is accompanied by her husband. Patient has discussed treatment options for management of hyperthyroidism with her endocrinologist. She initially was interested radioactive iodine treatment, but after discussion with her husband and doing some personal research, she has decided she would prefer to pursue surgery.  Procedure Details: Procedure was done in OR #1 at the [facility]. The patient was brought to the operating room and placed in a supine position on the operating room table. Following administration of general anesthesia, the patient was positioned and then prepped and draped in the usual aseptic fashion. After ascertaining that an adequate level of anesthesia had been achieved, a small Kocher incision was made with #15 blade. Dissection was carried through subcutaneous tissues and platysma.Hemostasis was achieved with the electrocautery. Skin flaps  were elevated cephalad and caudad from the thyroid notch to the sternal notch. A Mahorner self-retaining retractor was placed for exposure. Strap muscles were incised in the midline and dissection was begun on the left side.  Strap muscles were reflected laterally.  Left thyroid lobe was mildly enlarged and nodular.  The left lobe was gently mobilized with blunt dissection. Superior pole vessels were dissected out and divided individually between small and medium ligaclips with the harmonic scalpel. The thyroid lobe was rolled anteriorly. Branches of the inferior thyroid artery were divided between small ligaclips with the harmonic scalpel. Inferior venous tributaries were divided between ligaclips. Both the superior and inferior parathyroid glands were identified and preserved on their vascular pedicles. The recurrent laryngeal nerve was identified and preserved along its course. Using the Checkpoint [employer] nerve stimulator as a nerve protective device, the continuity of the nerve was confirmed using the 2 mA setting. The ligament of Gwenlyn Found was released with the electrocautery and the gland was mobilized onto the anterior trachea. Isthmus was mobilized across the midline. There was no pyramidal lobe present. Dry pack was placed in the left neck.  The right thyroid lobe was gently mobilized with blunt dissection. Right thyroid lobe was mildly enlarged and nodular. Superior pole vessels were dissected out and divided between small and medium ligaclips with the Harmonic scalpel. Superior parathyroid was identified and preserved. Inferior venous tributaries were divided between medium ligaclips with the harmonic scalpel. The right thyroid lobe was rolled anteriorly and the branches of the inferior thyroid artery divided between small ligaclips. The right recurrent laryngeal nerve was identified and preserved along its course. Using the Checkpoint Surgical nerve stimulator as a nerve protective device, the continuity  of the nerve was confirmed using the 2 mA setting. The ligament of Gwenlyn Found was released with the electrocautery. The right thyroid lobe was mobilized onto the anterior trachea and the remainder of the thyroid was dissected off the anterior trachea and the thyroid was completely excised. A suture was used to mark the right lobe. The entire thyroid gland was submitted to pathology for review.  The neck was irrigated with warm saline. Fibrillar was placed throughout the operative field. Strap muscles were approximated in the midline with interrupted 3-0 Vicryl sutures. Platysma was closed with interrupted 3-0 Vicryl sutures. Skin was closed with a running 4-0 Monocryl subcuticular suture. Wound was washed and Dermabond was applied. The patient was awakened from anesthesia and brought to the recovery room. The patient tolerated the procedure well.   Armandina Gemma, MD [facility] Office: [phone]

## 2018-09-30 NOTE — Transfer of Care (Signed)
Immediate Anesthesia Transfer of Care Note  Patient: Heather Fry  Procedure(s) Performed: TOTAL THYROIDECTOMY (N/A Neck)  Patient Location: PACU  Anesthesia Type:General  Level of Consciousness: awake, oriented, drowsy and patient cooperative  Airway & Oxygen Therapy: Patient Spontanous Breathing and Patient connected to face mask oxygen  Post-op Assessment: Report given to RN, Post -op Vital signs reviewed and stable and Patient moving all extremities  Post vital signs: Reviewed and stable  Last Vitals:  Vitals Value Taken Time  BP 151/93 09/30/2018  2:07 PM  Temp    Pulse 114 09/30/2018  2:12 PM  Resp 17 09/30/2018  2:12 PM  SpO2 95 % 09/30/2018  2:12 PM  Vitals shown include unvalidated device data.  Last Pain:  Vitals:   09/30/18 1407  TempSrc:   PainSc: (P) Asleep      Patients Stated Pain Goal: 4 (02/33/43 5686)  Complications: No apparent anesthesia complications

## 2018-10-01 ENCOUNTER — Encounter (HOSPITAL_COMMUNITY): Payer: Self-pay | Admitting: Surgery

## 2018-10-01 DIAGNOSIS — E0590 Thyrotoxicosis, unspecified without thyrotoxic crisis or storm: Secondary | ICD-10-CM | POA: Diagnosis not present

## 2018-10-01 DIAGNOSIS — F419 Anxiety disorder, unspecified: Secondary | ICD-10-CM | POA: Diagnosis not present

## 2018-10-01 DIAGNOSIS — F17200 Nicotine dependence, unspecified, uncomplicated: Secondary | ICD-10-CM | POA: Diagnosis not present

## 2018-10-01 DIAGNOSIS — E0520 Thyrotoxicosis with toxic multinodular goiter without thyrotoxic crisis or storm: Secondary | ICD-10-CM | POA: Diagnosis not present

## 2018-10-01 DIAGNOSIS — K219 Gastro-esophageal reflux disease without esophagitis: Secondary | ICD-10-CM | POA: Diagnosis not present

## 2018-10-01 DIAGNOSIS — Z79899 Other long term (current) drug therapy: Secondary | ICD-10-CM | POA: Diagnosis not present

## 2018-10-01 LAB — BASIC METABOLIC PANEL
Anion gap: 12 (ref 5–15)
BUN: 7 mg/dL (ref 6–20)
CO2: 25 mmol/L (ref 22–32)
Calcium: 8.9 mg/dL (ref 8.9–10.3)
Chloride: 102 mmol/L (ref 98–111)
Creatinine, Ser: 0.6 mg/dL (ref 0.44–1.00)
GFR calc non Af Amer: >60 mL/min (ref >60)
Glucose, Bld: 122 mg/dL — ABNORMAL HIGH (ref 70–99)
Potassium: 4.1 mmol/L (ref 3.5–5.1)
SODIUM: 139 mmol/L (ref 135–145)

## 2018-10-01 MED ORDER — TRAMADOL HCL 50 MG PO TABS: 50.0000 mg | ORAL_TABLET | Freq: Four times a day (QID) | ORAL | 0 refills | Status: DC | PRN

## 2018-10-01 NOTE — Discharge Summary (Signed)
Physician Discharge Summary [facility]  Patient ID: Heather Fry MRN: [id number] DOB/AGE: 1976/07/25 43 y.o.  Admit date: 09/30/2018 Discharge date: 10/01/2018  Admission Diagnoses:  Hyperthyroidism, multiple thyroid nodules  Discharge Diagnoses:  Principal Problem:   Hyperthyroidism Active Problems:   Multiple thyroid nodules   Discharged Condition: good  Hospital Course: Patient was admitted for observation following thyroid surgery.  Post op course was uncomplicated.  Pain was well controlled.  Tolerated diet.  Post op calcium level on morning following surgery was 8.9 mg/dl.  Patient was prepared for discharge home on POD#1.  Consults: None  Treatments: surgery: total thyroidectomy with nerve monitoring  Discharge Exam: Blood pressure 126/81, pulse 83, temperature 98.8 F (37.1 C), temperature source Oral, resp. rate 17, height 5\' 4"  (1.626 m), weight 110 kg, last menstrual period 10/09/2014, SpO2 94 %. HEENT - clear Neck - wound dry and intact; voice normal Chest - clear bilaterally Cor - RRR  Disposition: Home  Discharge Instructions    Diet - low sodium heart healthy   Complete by:  As directed    Discharge instructions   Complete by:  As directed    [facility]  THYROID & PARATHYROID SURGERY:  POST-OP INSTRUCTIONS  Always review your discharge instruction sheet from the facility where your surgery was performed.  A prescription for pain medication may be given to you upon discharge.  Take your pain medication as prescribed.  If narcotic pain medicine is not needed, then you may take acetaminophen (Tylenol) or ibuprofen (Advil) as needed.  Take your usually prescribed medications unless otherwise directed.  If you need a refill on your pain medication, please contact our office during regular business hours.  Prescriptions cannot be processed by our office after 5 pm or on weekends.  Start with a light diet upon  arrival home, such as soup and crackers or toast.  Be sure to drink plenty of fluids daily.  Resume your normal diet the day after surgery.  Most patients will experience some swelling and bruising on the chest and neck area.  Ice packs will help.  Swelling and bruising can take several days to resolve.   It is common to experience some constipation after surgery.  Increasing fluid intake and taking a stool softener (Colace) will usually help or prevent this problem.  A mild laxative (Milk of Magnesia or Miralax) should be taken according to package directions if there has been no bowel movement after 48 hours.  You have steri-strips and a gauze dressing over your incision.  You may remove the gauze bandage on the second day after surgery, and you may shower at that time.  Leave your steri-strips (small skin tapes) in place directly over the incision.  These strips should remain on the skin for 5-7 days and then be removed.  You may get them wet in the shower and pat them dry.  You may resume regular (light) daily activities beginning the next day (such as daily self-care, walking, climbing stairs) gradually increasing activities as tolerated.  You may have sexual intercourse when it is comfortable.  Refrain from any heavy lifting or straining until approved by your doctor.  You may drive when you no longer are taking prescription pain medication, you can comfortably wear a seatbelt, and you can safely maneuver your car and apply brakes.  You should see your doctor in the office for a follow-up appointment approximately three weeks after your surgery.  Make sure  that you call for this appointment within a day or two after you arrive home to insure a convenient appointment time.  WHEN TO CALL YOUR DOCTOR: -- Fever greater than 101.5 -- Inability to urinate -- Nausea and/or vomiting - persistent -- Extreme swelling or bruising -- Continued bleeding from incision -- Increased pain, redness, or  drainage from the incision -- Difficulty swallowing or breathing -- Muscle cramping or spasms -- Numbness or tingling in hands or around lips  The clinic staff is available to answer your questions during regular business hours.  Please don't hesitate to call and ask to speak to one of the nurses if you have concerns.  Armandina Gemma, MD [facility] Office: [phone]   Increase activity slowly   Complete by:  As directed    No dressing needed   Complete by:  As directed      Allergies as of 10/01/2018      Reactions   Levofloxacin Other (See Comments)   Pt can not have due to taking Lexapro    Penicillins Other (See Comments)   Loopy, childhood allergy DID THE REACTION INVOLVE: Swelling of the face/tongue/throat, SOB, or low BP? Unknown Sudden or severe rash/hives, skin peeling, or the inside of the mouth or nose? Unknown Did it require medical treatment? Yes When did it last happen?Childhood allergy If all above answers are "NO", may proceed with cephalosporin use.      Medication List    TAKE these medications   acetaminophen 500 MG tablet Commonly known as:  TYLENOL Take 1,000 mg by mouth daily as needed for moderate pain.   BENEFIBER DRINK MIX PO Take 1 Dose by mouth 3 (three) times daily.   diphenhydrAMINE 25 MG tablet Commonly known as:  BENADRYL Take 25 mg by mouth every 6 (six) hours as needed (for sleep).   escitalopram 20 MG tablet Commonly known as:  LEXAPRO Take 1 tablet (20 mg total) by mouth at bedtime.   Fish Oil 1000 MG Caps Take 1,000 mg by mouth at bedtime.   loratadine-pseudoephedrine 5-120 MG tablet Commonly known as:  CLARITIN-D 12-hour Take 1 tablet by mouth daily as needed (cold symptoms).   meclizine 25 MG tablet Commonly known as:  ANTIVERT Take 25 mg by mouth 3 (three) times daily as needed for dizziness.   metoprolol tartrate 25 MG tablet Commonly known as:  LOPRESSOR Take 0.5 tablets (12.5 mg total) by  mouth daily. What changed:    how much to take  when to take this  additional instructions   pantoprazole 40 MG tablet Commonly known as:  PROTONIX Take 40 mg by mouth every evening.   PHAZYME 125 MG chewable tablet Generic drug:  simethicone Chew 125-250 mg by mouth every 6 (six) hours as needed for flatulence.   traMADol 50 MG tablet Commonly known as:  ULTRAM Take 1-2 tablets (50-100 mg total) by mouth every 6 (six) hours as needed.   VICKS VAPORUB EX Apply 1 application topically daily as needed (congestion/sinus headaches).   Vitamin D3 125 MCG (5000 UT) Caps Take 5,000 Units by mouth at bedtime.        Earnstine Regal, MD, [facility] Office: [phone]   Signed: Earnstine Regal 10/01/2018, 8:01 AM

## 2018-10-04 NOTE — Progress Notes (Signed)
Please contact patient and notify of benign pathology results.  Alegra Rost M. Shanyn Preisler, MD, FACS [facility] Office: [phone]   

## 2018-10-06 ENCOUNTER — Ambulatory Visit (INDEPENDENT_AMBULATORY_CARE_PROVIDER_SITE_OTHER): Payer: BLUE CROSS/BLUE SHIELD | Admitting: Endocrinology

## 2018-10-06 ENCOUNTER — Encounter: Payer: Self-pay | Admitting: Endocrinology

## 2018-10-06 VITALS — BP 148/92 | HR 84 | Ht 64.0 in | Wt 242.4 lb

## 2018-10-06 DIAGNOSIS — E559 Vitamin D deficiency, unspecified: Secondary | ICD-10-CM | POA: Diagnosis not present

## 2018-10-06 DIAGNOSIS — E039 Hypothyroidism, unspecified: Secondary | ICD-10-CM

## 2018-10-06 LAB — BASIC METABOLIC PANEL
BUN: 7 mg/dL (ref 6–23)
CO2: 27 mEq/L (ref 19–32)
Calcium: 9.8 mg/dL (ref 8.4–10.5)
Chloride: 101 mEq/L (ref 96–112)
Creatinine, Ser: 0.58 mg/dL (ref 0.40–1.20)
GFR: 120.82 mL/min (ref >60.00)
GLUCOSE: 103 mg/dL — AB (ref 70–99)
Potassium: 3.6 mEq/L (ref 3.5–5.1)
Sodium: 136 mEq/L (ref 135–145)

## 2018-10-06 LAB — T4, FREE: Free T4: 0.5 ng/dL — ABNORMAL LOW (ref 0.60–1.60)

## 2018-10-06 LAB — TSH: TSH: 0.35 u[IU]/mL (ref 0.35–4.50)

## 2018-10-06 LAB — T3, FREE: T3, Free: 2.8 pg/mL (ref 2.3–4.2)

## 2018-10-06 LAB — VITAMIN D 25 HYDROXY (VIT D DEFICIENCY, FRACTURES): VITD: 31.94 ng/mL (ref 30.00–100.00)

## 2018-10-06 MED ORDER — LEVOTHYROXINE SODIUM 125 MCG PO TABS: 125.0000 ug | ORAL_TABLET | Freq: Every day | ORAL | 3 refills | Status: DC

## 2018-10-06 NOTE — Progress Notes (Signed)
Subjective:    Patient ID: Heather Fry, female    DOB: 03-20-1976, 43 y.o.   MRN: [id number]  HPI Pt returns for f/u of hyperthyroidism (dx'ed 2017; she was rx'ed methimazole; he was also rx'ed metoprolol, but she can only tolerate 12.5 mg PRN (at 25, she gets low BP and HR).  She had thyroidectomy last week.  Wound is healing well, but she has moderate palpitations in the chest, and assoc fatigue.  She takes no thyroid medication now.  She takes Tums 2-BID.  She does not take vit-D supplement now.   Past Medical History:  Diagnosis Date  . Anxiety   . Arthritis   . Depression   . Diverticulosis   . GERD (gastroesophageal reflux disease)   . Graves disease   . Internal hemorrhoids   . Thyroid disease   . Uterine fibroid     Past Surgical History:  Procedure Laterality Date  . ABDOMINAL HYSTERECTOMY     Per patient, uterine fibroids.  . ABDOMINAL HYSTERECTOMY    . CHOLECYSTECTOMY N/A 11/06/2014   Procedure: LAPAROSCOPIC CHOLECYSTECTOMY;  Surgeon: Jamesetta So, MD;  Location: [facility];  Service: General;  Laterality: N/A;  . COLONOSCOPY N/A 03/04/2017   Procedure: COLONOSCOPY;  Surgeon: Daneil Dolin, MD;  Location: [facility];  Service: Endoscopy;  Laterality: N/A;  8:30am  . DILATION AND CURETTAGE OF UTERUS    . ESOPHAGOGASTRODUODENOSCOPY N/A 10/02/2014   Procedure: ESOPHAGOGASTRODUODENOSCOPY (EGD);  Surgeon: Daneil Dolin, MD;  Location: [facility];  Service: Endoscopy;  Laterality: N/A;  945am - moved to 8:45 - Ginger notified pt  . MOUTH SURGERY     extraction of teeth  . POLYPECTOMY  03/04/2017   Procedure: POLYPECTOMY;  Surgeon: Daneil Dolin, MD;  Location: [facility];  Service: Endoscopy;;  colon  . THYROIDECTOMY N/A 09/30/2018   Procedure: TOTAL THYROIDECTOMY;  Surgeon: Armandina Gemma, MD;  Location: [facility];  Service: General;  Laterality: N/A;  . TUBAL LIGATION      Social History   Socioeconomic History  . Marital status: Married    Spouse name: Not on  file  . Number of children: Not on file  . Years of education: Not on file  . Highest education level: Not on file  Occupational History  . Not on file  Social Needs  . Financial resource strain: Not on file  . Food insecurity:    Worry: Not on file    Inability: Not on file  . Transportation needs:    Medical: Not on file    Non-medical: Not on file  Tobacco Use  . Smoking status: Current Every Day Smoker    Packs/day: 0.50    Years: 3.00    Pack years: 1.50    Types: Cigarettes    Last attempt to quit: 11/03/1997    Years since quitting: 20.9  . Smokeless tobacco: Never Used  . Tobacco comment: 1 pack 1 week  Substance and Sexual Activity  . Alcohol use: No    Alcohol/week: 0.0 standard drinks  . Drug use: No  . Sexual activity: Yes    Birth control/protection: Surgical  Lifestyle  . Physical activity:    Days per week: Not on file    Minutes per session: Not on file  . Stress: Not on file  Relationships  . Social connections:    Talks on phone: Not on file    Gets together: Not on file    Attends religious service:  Not on file    Active member of club or organization: Not on file    Attends meetings of clubs or organizations: Not on file    Relationship status: Not on file  . Intimate partner violence:    Fear of current or ex partner: Not on file    Emotionally abused: Not on file    Physically abused: Not on file    Forced sexual activity: Not on file  Other Topics Concern  . Not on file  Social History Narrative  . Not on file    Current Outpatient Medications on File Prior to Visit  Medication Sig Dispense Refill  . acetaminophen (TYLENOL) 500 MG tablet Take 1,000 mg by mouth daily as needed for moderate pain.    . Camphor-Eucalyptus-Menthol (VICKS VAPORUB EX) Apply 1 application topically daily as needed (congestion/sinus headaches).    . Cholecalciferol (VITAMIN D3) 125 MCG (5000 UT) CAPS Take 5,000 Units by mouth at bedtime.    . diphenhydrAMINE  (BENADRYL) 25 MG tablet Take 25 mg by mouth every 6 (six) hours as needed (for sleep).    Marland Kitchen escitalopram (LEXAPRO) 20 MG tablet Take 1 tablet (20 mg total) by mouth at bedtime. 30 tablet 5  . metoprolol tartrate (LOPRESSOR) 25 MG tablet Take 0.5 tablets (12.5 mg total) by mouth daily. (Patient taking differently: Take 6.25-12.5 mg by mouth every evening. If HR is over 90 take 12.5 mg, if under take 6.25 mg) 30 tablet 3  . Omega-3 Fatty Acids (FISH OIL) 1000 MG CAPS Take 1,000 mg by mouth at bedtime.    . pantoprazole (PROTONIX) 40 MG tablet Take 40 mg by mouth every evening.     . traMADol (ULTRAM) 50 MG tablet Take 1-2 tablets (50-100 mg total) by mouth every 6 (six) hours as needed. 15 tablet 0  . Wheat Dextrin (BENEFIBER DRINK MIX PO) Take 1 Dose by mouth 3 (three) times daily.     Marland Kitchen loratadine-pseudoephedrine (CLARITIN-D 12-HOUR) 5-120 MG tablet Take 1 tablet by mouth daily as needed (cold symptoms).    . meclizine (ANTIVERT) 25 MG tablet Take 25 mg by mouth 3 (three) times daily as needed for dizziness.    . simethicone (PHAZYME) 125 MG chewable tablet Chew 125-250 mg by mouth every 6 (six) hours as needed for flatulence.      No current facility-administered medications on file prior to visit.     Allergies  Allergen Reactions  . Levofloxacin Other (See Comments)    Pt can not have due to taking Lexapro   . Penicillins Other (See Comments)    Loopy, childhood allergy DID THE REACTION INVOLVE: Swelling of the face/tongue/throat, SOB, or low BP? Unknown Sudden or severe rash/hives, skin peeling, or the inside of the mouth or nose? Unknown Did it require medical treatment? Yes When did it last happen?Childhood allergy If all above answers are "NO", may proceed with cephalosporin use.     Family History  Problem Relation Age of Onset  . Hypertension Father   . Diabetes Father   . Depression Other   . Uterine cancer Mother        ?cervical cancer  . Uterine cancer Sister         suicide  . Colon cancer Paternal Aunt 52  . Thyroid disease Neg Hx     BP (!) 148/92 (BP Location: Left Arm, Patient Position: Sitting, Cuff Size: Normal)   Pulse 84   Ht 5\' 4"  (1.626 m)   Wt 242 lb  6.4 oz (110 kg)   LMP 10/09/2014   SpO2 97%   BMI 41.61 kg/m   Review of Systems Lightheadedness is now mild.  She has slight muscle cramps, but no numbness.  Denies LOC and fever.      Objective:   Physical Exam VITAL SIGNS:  See vs page GENERAL: no distress Neck: a healing scar is present.  I do not appreciate a nodule in the thyroid or elsewhere in the neck.    Pathol: NODULAR HYPERPLASIA. NO MALIGNANCY IDENTIFIED.    Lab Results  Component Value Date   TSH 0.35 10/06/2018   Lab Results  Component Value Date   PTH 11 (L) 10/06/2018   CALCIUM 9.8 10/06/2018   CALCIUM 9.5 10/06/2018       Assessment & Plan:  Postsurgical hypothyroidism: new.  Hypoparathyroidism: mild.  This is almost always transient.  HTN: is noted today.   Patient Instructions  blood tests are requested for you today.  We'll let you know about the results. Based on the results, you will need a thyroid hormone pill, vitamin-D, or both.   Please come back for a follow-up appointment in 1 month.   Please follow up your blood pressure with Dr Gerarda Fraction.

## 2018-10-06 NOTE — Patient Instructions (Addendum)
blood tests are requested for you today.  We'll let you know about the results. Based on the results, you will need a thyroid hormone pill, vitamin-D, or both.   Please come back for a follow-up appointment in 1 month.   Please follow up your blood pressure with Dr Gerarda Fraction.

## 2018-10-07 ENCOUNTER — Telehealth: Payer: Self-pay | Admitting: Endocrinology

## 2018-10-07 ENCOUNTER — Ambulatory Visit: Payer: BLUE CROSS/BLUE SHIELD | Admitting: Endocrinology

## 2018-10-07 LAB — PTH, INTACT AND CALCIUM
Calcium: 9.5 mg/dL (ref 8.6–10.2)
PTH: 11 pg/mL — ABNORMAL LOW (ref 14–64)

## 2018-10-07 NOTE — Telephone Encounter (Signed)
Spoke to [employer] at pharmacy and she verified receipt of rx and pt infromed

## 2018-10-07 NOTE — Telephone Encounter (Signed)
levothyroxine (SYNTHROID, LEVOTHROID) 125 MCG tablet  Patient would like this prescription resent to the pharmacy.  they told the patient they have did not receive this.    [id number] - [place], [place] [address]

## 2018-10-09 ENCOUNTER — Encounter (HOSPITAL_COMMUNITY): Payer: Self-pay | Admitting: Emergency Medicine

## 2018-10-09 ENCOUNTER — Emergency Department (HOSPITAL_COMMUNITY)
Admission: EM | Admit: 2018-10-09 | Discharge: 2018-10-09 | Disposition: A | Discharge status: Home / Self Care | Payer: BLUE CROSS/BLUE SHIELD | Attending: Emergency Medicine | Admitting: Emergency Medicine

## 2018-10-09 ENCOUNTER — Other Ambulatory Visit: Payer: Self-pay

## 2018-10-09 ENCOUNTER — Encounter (HOSPITAL_COMMUNITY): Payer: Self-pay | Admitting: *Deleted

## 2018-10-09 ENCOUNTER — Emergency Department (HOSPITAL_COMMUNITY)
Admission: EM | Admit: 2018-10-09 | Discharge: 2018-10-09 | Disposition: A | Discharge status: Home / Self Care | Payer: BLUE CROSS/BLUE SHIELD | Source: Home / Self Care | Attending: Emergency Medicine | Admitting: Emergency Medicine

## 2018-10-09 DIAGNOSIS — Z87891 Personal history of nicotine dependence: Secondary | ICD-10-CM | POA: Insufficient documentation

## 2018-10-09 DIAGNOSIS — R202 Paresthesia of skin: Secondary | ICD-10-CM | POA: Insufficient documentation

## 2018-10-09 DIAGNOSIS — R9431 Abnormal electrocardiogram [ECG] [EKG]: Secondary | ICD-10-CM | POA: Diagnosis not present

## 2018-10-09 DIAGNOSIS — R200 Anesthesia of skin: Secondary | ICD-10-CM

## 2018-10-09 DIAGNOSIS — Z79899 Other long term (current) drug therapy: Secondary | ICD-10-CM | POA: Insufficient documentation

## 2018-10-09 DIAGNOSIS — E039 Hypothyroidism, unspecified: Secondary | ICD-10-CM | POA: Insufficient documentation

## 2018-10-09 DIAGNOSIS — R51 Headache: Secondary | ICD-10-CM | POA: Diagnosis not present

## 2018-10-09 LAB — I-STAT CHEM 8, ED
BUN: 5 mg/dL — ABNORMAL LOW (ref 6–20)
Calcium, Ion: 1.15 mmol/L (ref 1.15–1.40)
Chloride: 104 mmol/L (ref 98–111)
Creatinine, Ser: 0.4 mg/dL — ABNORMAL LOW (ref 0.44–1.00)
Glucose, Bld: 136 mg/dL — ABNORMAL HIGH (ref 70–99)
HEMATOCRIT: 43 % (ref 36.0–46.0)
Hemoglobin: 14.6 g/dL (ref 12.0–15.0)
Potassium: 3.7 mmol/L (ref 3.5–5.1)
SODIUM: 138 mmol/L (ref 135–145)
TCO2: 23 mmol/L (ref 22–32)

## 2018-10-09 LAB — CBC WITH DIFFERENTIAL/PLATELET
Abs Immature Granulocytes: 0.03 10*3/uL (ref 0.00–0.07)
Basophils Absolute: 0 10*3/uL (ref 0.0–0.1)
Basophils Relative: 0 %
Eosinophils Absolute: 0.2 10*3/uL (ref 0.0–0.5)
Eosinophils Relative: 1 %
HCT: 41.6 % (ref 36.0–46.0)
Hemoglobin: 13.8 g/dL (ref 12.0–15.0)
IMMATURE GRANULOCYTES: 0 %
Lymphocytes Relative: 15 %
Lymphs Abs: 2 10*3/uL (ref 0.7–4.0)
MCH: 29 pg (ref 26.0–34.0)
MCHC: 33.2 g/dL (ref 30.0–36.0)
MCV: 87.4 fL (ref 80.0–100.0)
MONOS PCT: 5 %
Monocytes Absolute: 0.6 10*3/uL (ref 0.1–1.0)
Neutro Abs: 10.4 10*3/uL — ABNORMAL HIGH (ref 1.7–7.7)
Neutrophils Relative %: 79 %
Platelets: 269 10*3/uL (ref 150–400)
RBC: 4.76 MIL/uL (ref 3.87–5.11)
RDW: 13.2 % (ref 11.5–15.5)
WBC: 13.3 10*3/uL — ABNORMAL HIGH (ref 4.0–10.5)
nRBC: 0 % (ref 0.0–0.2)

## 2018-10-09 LAB — COMPREHENSIVE METABOLIC PANEL
ALT: 21 U/L (ref 0–44)
AST: 19 U/L (ref 15–41)
Albumin: 3.9 g/dL (ref 3.5–5.0)
Alkaline Phosphatase: 59 U/L (ref 38–126)
Anion gap: 9 (ref 5–15)
BUN: 6 mg/dL (ref 6–20)
CO2: 24 mmol/L (ref 22–32)
Calcium: 9.3 mg/dL (ref 8.9–10.3)
Chloride: 104 mmol/L (ref 98–111)
Creatinine, Ser: 0.53 mg/dL (ref 0.44–1.00)
GFR calc Af Amer: >60 mL/min (ref >60)
GFR calc non Af Amer: >60 mL/min (ref >60)
Glucose, Bld: 99 mg/dL (ref 70–99)
Potassium: 3.9 mmol/L (ref 3.5–5.1)
Sodium: 137 mmol/L (ref 135–145)
Total Bilirubin: 0.5 mg/dL (ref 0.3–1.2)
Total Protein: 7.7 g/dL (ref 6.5–8.1)

## 2018-10-09 LAB — HEPATIC FUNCTION PANEL
ALK PHOS: 55 U/L (ref 38–126)
ALT: 21 U/L (ref 0–44)
AST: 21 U/L (ref 15–41)
Albumin: 3.8 g/dL (ref 3.5–5.0)
Bilirubin, Direct: <0.1 mg/dL (ref 0.0–0.2)
Total Bilirubin: 0.4 mg/dL (ref 0.3–1.2)
Total Protein: 7.6 g/dL (ref 6.5–8.1)

## 2018-10-09 LAB — BASIC METABOLIC PANEL
ANION GAP: 8 (ref 5–15)
BUN: 8 mg/dL (ref 6–20)
CALCIUM: 8.5 mg/dL — AB (ref 8.9–10.3)
CO2: 21 mmol/L — ABNORMAL LOW (ref 22–32)
Chloride: 106 mmol/L (ref 98–111)
Creatinine, Ser: 0.54 mg/dL (ref 0.44–1.00)
GFR calc Af Amer: >60 mL/min (ref >60)
GFR calc non Af Amer: >60 mL/min (ref >60)
Glucose, Bld: 139 mg/dL — ABNORMAL HIGH (ref 70–99)
Potassium: 3.5 mmol/L (ref 3.5–5.1)
Sodium: 135 mmol/L (ref 135–145)

## 2018-10-09 LAB — MAGNESIUM
Magnesium: 2 mg/dL (ref 1.7–2.4)
Magnesium: 1.9 mg/dL (ref 1.7–2.4)

## 2018-10-09 LAB — I-STAT BETA HCG BLOOD, ED (MC, WL, AP ONLY): I-stat hCG, quantitative: <5 m[IU]/mL (ref <5)

## 2018-10-09 MED ORDER — CALCIUM CARBONATE ANTACID 500 MG PO CHEW
1.0000 | CHEWABLE_TABLET | Freq: Once | ORAL | Status: AC
  Administered 2018-10-09: 200 mg via ORAL
  Filled 2018-10-09: qty 1

## 2018-10-09 MED ORDER — MAGNESIUM OXIDE 400 (241.3 MG) MG PO TABS
200.0000 mg | ORAL_TABLET | Freq: Once | ORAL | Status: AC
  Administered 2018-10-09: 200 mg via ORAL
  Filled 2018-10-09: qty 1

## 2018-10-09 NOTE — ED Provider Notes (Signed)
[facility] Emergency Department Provider Note MRN:  [id number]  Arrival date & time: 10/09/18     Chief Complaint   lab check (calcium )   History of Present Illness   Heather Fry is a 43 y.o. year-old female with a history of Graves' disease status post thyroidectomy presenting to the ED with chief complaint of lab check.  Patient had her thyroid removed January 2, has been recovering well.  Yesterday was experiencing tingling sensations and paresthesias to the bilateral face, hands, feet, was evaluated here in the emergency department.  Her calcium was downtrending, she was discharged with an increased home calcium regimen.  Again today, she is endorsing paresthesias to the left side of her face and left hand.  Symptoms are intermittent.  Denies numbness, no weakness, no headache, no vision change, no increased pain at the surgical site, no other symptoms.  Review of Systems  A complete 10 system review of systems was obtained and all systems are negative except as noted in the HPI and PMH.   Patient's Health History    Past Medical History:  Diagnosis Date  . Anxiety   . Arthritis   . Depression   . Diverticulosis   . GERD (gastroesophageal reflux disease)   . Graves disease   . Internal hemorrhoids   . Thyroid disease   . Uterine fibroid     Past Surgical History:  Procedure Laterality Date  . ABDOMINAL HYSTERECTOMY     Per patient, uterine fibroids.  . ABDOMINAL HYSTERECTOMY    . CHOLECYSTECTOMY N/A 11/06/2014   Procedure: LAPAROSCOPIC CHOLECYSTECTOMY;  Surgeon: Jamesetta So, MD;  Location: [facility];  Service: General;  Laterality: N/A;  . COLONOSCOPY N/A 03/04/2017   Procedure: COLONOSCOPY;  Surgeon: Daneil Dolin, MD;  Location: [facility];  Service: Endoscopy;  Laterality: N/A;  8:30am  . DILATION AND CURETTAGE OF UTERUS    . ESOPHAGOGASTRODUODENOSCOPY N/A 10/02/2014   Procedure: ESOPHAGOGASTRODUODENOSCOPY (EGD);  Surgeon: Daneil Dolin, MD;   Location: [facility];  Service: Endoscopy;  Laterality: N/A;  945am - moved to 8:45 - Ginger notified pt  . MOUTH SURGERY     extraction of teeth  . POLYPECTOMY  03/04/2017   Procedure: POLYPECTOMY;  Surgeon: Daneil Dolin, MD;  Location: [facility];  Service: Endoscopy;;  colon  . THYROIDECTOMY N/A 09/30/2018   Procedure: TOTAL THYROIDECTOMY;  Surgeon: Armandina Gemma, MD;  Location: [facility];  Service: General;  Laterality: N/A;  . TUBAL LIGATION      Family History  Problem Relation Age of Onset  . Hypertension Father   . Diabetes Father   . Depression Other   . Uterine cancer Mother        ?cervical cancer  . Uterine cancer Sister        suicide  . Colon cancer Paternal Aunt 92  . Thyroid disease Neg Hx     Social History   Socioeconomic History  . Marital status: Married    Spouse name: Not on file  . Number of children: Not on file  . Years of education: Not on file  . Highest education level: Not on file  Occupational History  . Not on file  Social Needs  . Financial resource strain: Not on file  . Food insecurity:    Worry: Not on file    Inability: Not on file  . Transportation needs:    Medical: Not on file    Non-medical: Not on  file  Tobacco Use  . Smoking status: Former Smoker    Packs/day: 0.50    Years: 3.00    Pack years: 1.50    Types: Cigarettes    Last attempt to quit: 11/03/1997    Years since quitting: 20.9  . Smokeless tobacco: Never Used  . Tobacco comment: 1 pack 1 week  Substance and Sexual Activity  . Alcohol use: No    Alcohol/week: 0.0 standard drinks  . Drug use: No  . Sexual activity: Yes    Birth control/protection: Surgical  Lifestyle  . Physical activity:    Days per week: Not on file    Minutes per session: Not on file  . Stress: Not on file  Relationships  . Social connections:    Talks on phone: Not on file    Gets together: Not on file    Attends religious service: Not on file    Active member of club or organization:  Not on file    Attends meetings of clubs or organizations: Not on file    Relationship status: Not on file  . Intimate partner violence:    Fear of current or ex partner: Not on file    Emotionally abused: Not on file    Physically abused: Not on file    Forced sexual activity: Not on file  Other Topics Concern  . Not on file  Social History Narrative  . Not on file     Physical Exam  Vital Signs and Nursing Notes reviewed Vitals:   10/09/18 2031  BP: (!) 143/98  Pulse: 79  Resp: 16  Temp: 98.2 F (36.8 C)  SpO2: 98%    CONSTITUTIONAL: Well-appearing, NAD NEURO:  Alert and oriented x 3, normal and symmetric strength and sensation EYES:  eyes equal and reactive ENT/NECK:  no LAD, no JVD; well-healing midline anterior neck incision site CARDIO: Regular rate, well-perfused, normal S1 and S2 PULM:  CTAB no wheezing or rhonchi GI/GU:  normal bowel sounds, non-distended, non-tender MSK/SPINE:  No gross deformities, no edema SKIN:  no rash, atraumatic PSYCH:  Appropriate speech and behavior  Diagnostic and Interventional Summary    Labs Reviewed  COMPREHENSIVE METABOLIC PANEL  MAGNESIUM    No orders to display    Medications - No data to display   Procedures Critical Care  ED Course and Medical Decision Making  I have reviewed the triage vital signs and the nursing notes.  Pertinent labs & imaging results that were available during my care of the patient were reviewed by me and considered in my medical decision making (see below for details).  We will recheck calcium given patient's recent surgery.  Normal neurological exam, it is peculiar that her symptoms are unilateral today but her sensation on exam is normal, as well as her strength.  Consistent with paresthesia.  No neck pain.  Considering anxiety component to her symptoms.  Still awaiting labs, anticipating discharge with reassurance.  If there is significant downtrend, will consider repletion prior to  discharge.  Signed out to Dr. Thurnell Garbe at shift change.  Barth Kirks. Sedonia Small, [place] [email or web address]  Final Clinical Impressions(s) / ED Diagnoses     ICD-10-CM   1. Paresthesia R20.2     ED Discharge Orders    None         Maudie Flakes, MD 10/09/18 2141

## 2018-10-09 NOTE — ED Triage Notes (Signed)
Pt reports she would like to have her calcium rechecked following visit last night, pt states "I feel like my calcium is dropping", when asked to describe symptoms pt reports she feels weak and hands and feet feel tingly since this afternoon

## 2018-10-09 NOTE — Discharge Instructions (Addendum)
You were evaluated in the Emergency Department and after careful evaluation, we did not find any emergent condition requiring admission or further testing in the hospital. You will need your electrolytes (calcium) level rechecked.  Call your Endocrinologist on Monday to schedule a follow up appointment within the next 2 days.   Please return to the Emergency Department if you experience any worsening of your condition.  We encourage you to follow up with a primary care provider.  Thank you for allowing [place] to be a part of your care.

## 2018-10-09 NOTE — ED Provider Notes (Signed)
Pt received at sign out with labs pending. Please see previous EDP note for detailed HPI/H&P/MDM.  Labs today reassuring. Will give dose of magnesium PO for low-normal magnesium level. Pt to continue with usual meds, f/u Endo MD on Monday. Pt agreeable with plan. Dx and testing d/w pt and family.  Questions answered.  Verb understanding, agreeable to d/c home with outpt f/u.    BP (!) 143/98 (BP Location: Left Arm)   Pulse 78   Temp 98.2 F (36.8 C) (Temporal)   Resp 16   LMP 10/09/2014   SpO2 99%   Results for orders placed or performed during the hospital encounter of 10/09/18  Comprehensive metabolic panel  Result Value Ref Range   Sodium 137 135 - 145 mmol/L   Potassium 3.9 3.5 - 5.1 mmol/L   Chloride 104 98 - 111 mmol/L   CO2 24 22 - 32 mmol/L   Glucose, Bld 99 70 - 99 mg/dL   BUN 6 6 - 20 mg/dL   Creatinine, Ser 0.53 0.44 - 1.00 mg/dL   Calcium 9.3 8.9 - 10.3 mg/dL   Total Protein 7.7 6.5 - 8.1 g/dL   Albumin 3.9 3.5 - 5.0 g/dL   AST 19 15 - 41 U/L   ALT 21 0 - 44 U/L   Alkaline Phosphatase 59 38 - 126 U/L   Total Bilirubin 0.5 0.3 - 1.2 mg/dL   GFR calc non Af Amer >60 >60 mL/min   GFR calc Af Amer >60 >60 mL/min   Anion gap 9 5 - 15  Magnesium  Result Value Ref Range   Magnesium 1.9 1.7 - 2.4 mg/dL       Francine Graven, DO 10/09/18 2245

## 2018-10-09 NOTE — ED Triage Notes (Signed)
Pt c/o facial numbness to face, bilateral arms and legs that started around midnight, pt reports that she had her thyroid removed last week,

## 2018-10-09 NOTE — ED Provider Notes (Signed)
[facility] EMERGENCY DEPARTMENT Provider Note   CSN: [id number] Arrival date & time: 10/09/18  0229     History   Chief Complaint Chief Complaint  Patient presents with  . Numbness    HPI Heather Fry is a 43 y.o. female.  The history is provided by the patient.  Neurologic Problem  This is a new problem. The current episode started 3 to 5 hours ago. The problem occurs constantly. The problem has been gradually improving. Associated symptoms include headaches. Pertinent negatives include no chest pain. Nothing aggravates the symptoms. Nothing relieves the symptoms.  Patient with history of Graves' disease with recent total thyroidectomy on January 2 She has done well postoperatively.  Her endocrinologist has advised her to cut back on her Tums. Tonight she began noticing numbness in her face and began having numbness in her hands and feet.  No focal weakness.  No visual changes, no slurred speech.  She does report mild headache.  Past Medical History:  Diagnosis Date  . Anxiety   . Arthritis   . Depression   . Diverticulosis   . GERD (gastroesophageal reflux disease)   . Graves disease   . Internal hemorrhoids   . Thyroid disease   . Uterine fibroid     Patient Active Problem List   Diagnosis Date Noted  . Vitamin D deficiency 10/06/2018  . Multiple thyroid nodules 09/26/2018  . GERD (gastroesophageal reflux disease) 01/28/2017  . Rectal bleeding 01/28/2017  . 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 Hypothyroidism 04/14/2016  . Gastric polyp   . RUQ abdominal pain 09/06/2014  . Excessive or frequent menstruation 01/13/2013  . Depression     Past Surgical History:  Procedure Laterality Date  . ABDOMINAL HYSTERECTOMY     Per patient, uterine fibroids.  . ABDOMINAL HYSTERECTOMY    . CHOLECYSTECTOMY N/A 11/06/2014   Procedure: LAPAROSCOPIC CHOLECYSTECTOMY;  Surgeon: Jamesetta So, MD;  Location: [facility];  Service: General;  Laterality: N/A;  . COLONOSCOPY N/A  03/04/2017   Procedure: COLONOSCOPY;  Surgeon: Daneil Dolin, MD;  [facility];  Service: Endoscopy;  Laterality: N/A;  8:30am  . DILATION AND CURETTAGE OF UTERUS    . ESOPHAGOGASTRODUODENOSCOPY N/A 10/02/2014   Procedure: ESOPHAGOGASTRODUODENOSCOPY (EGD);  Surgeon: Daneil Dolin, MD;  [facility];  Service: Endoscopy;  Laterality: N/A;  945am - moved to 8:45 - Ginger notified pt  . MOUTH SURGERY     extraction of teeth  . POLYPECTOMY  03/04/2017   Procedure: POLYPECTOMY;  Surgeon: Daneil Dolin, MD;  Location: [facility];  Service: Endoscopy;;  colon  . THYROIDECTOMY N/A 09/30/2018   Procedure: TOTAL THYROIDECTOMY;  Surgeon: Armandina Gemma, MD;  Location: [facility];  Service: General;  Laterality: N/A;  . TUBAL LIGATION       OB History    Gravida  2   Para  2   Term  2   Preterm      AB      Living  2     SAB      TAB      Ectopic      Multiple      Live Births               Home Medications    Prior to Admission medications   Medication Sig Start Date End Date Taking? Authorizing Provider  acetaminophen (TYLENOL) 500 MG tablet Take 1,000 mg by mouth daily as needed for moderate pain.  [provider]  Camphor-Eucalyptus-Menthol (VICKS VAPORUB EX) Apply 1 application topically daily as needed (congestion/sinus headaches).    [provider]  Cholecalciferol (VITAMIN D3) 125 MCG (5000 UT) CAPS Take 5,000 Units by mouth at bedtime.    [provider]  diphenhydrAMINE (BENADRYL) 25 MG tablet Take 25 mg by mouth every 6 (six) hours as needed (for sleep).    [provider]  escitalopram (LEXAPRO) 20 MG tablet Take 1 tablet (20 mg total) by mouth at bedtime. 07/23/18   Renato Shin, MD  levothyroxine (SYNTHROID, LEVOTHROID) 125 MCG tablet Take 1 tablet (125 mcg total) by mouth daily before breakfast. 10/06/18   Renato Shin, MD  loratadine-pseudoephedrine (CLARITIN-D 12-HOUR) 5-120 MG tablet Take 1 tablet by  mouth daily as needed (cold symptoms).    [provider]  meclizine (ANTIVERT) 25 MG tablet Take 25 mg by mouth 3 (three) times daily as needed for dizziness.    [provider]  metoprolol tartrate (LOPRESSOR) 25 MG tablet Take 0.5 tablets (12.5 mg total) by mouth daily. Patient taking differently: Take 6.25-12.5 mg by mouth every evening. If HR is over 90 take 12.5 mg, if under take 6.25 mg 07/23/18   Renato Shin, MD  Omega-3 Fatty Acids (FISH OIL) 1000 MG CAPS Take 1,000 mg by mouth at bedtime.    [provider]  pantoprazole (PROTONIX) 40 MG tablet Take 40 mg by mouth every evening.     [provider]  simethicone (PHAZYME) 125 MG chewable tablet Chew 125-250 mg by mouth every 6 (six) hours as needed for flatulence.     [provider]  traMADol (ULTRAM) 50 MG tablet Take 1-2 tablets (50-100 mg total) by mouth every 6 (six) hours as needed. 10/01/18   Armandina Gemma, MD  Wheat Dextrin (BENEFIBER DRINK MIX PO) Take 1 Dose by mouth 3 (three) times daily.     [provider]    Family History Family History  Problem Relation Age of Onset  . Hypertension Father   . Diabetes Father   . Depression Other   . Uterine cancer Mother        ?cervical cancer  . Uterine cancer Sister        suicide  . Colon cancer Paternal Aunt 3  . Thyroid disease Neg Hx     Social History Social History   Tobacco Use  . Smoking status: Former Smoker    Packs/day: 0.50    Years: 3.00    Pack years: 1.50    Types: Cigarettes    Last attempt to quit: 11/03/1997    Years since quitting: 20.9  . Smokeless tobacco: Never Used  . Tobacco comment: 1 pack 1 week  Substance Use Topics  . Alcohol use: No    Alcohol/week: 0.0 standard drinks  . Drug use: No     Allergies   Levofloxacin and Penicillins   Review of Systems Review of Systems  Constitutional: Negative for fever.  Eyes: Negative for visual disturbance.  Cardiovascular: Negative for  chest pain.  Neurological: Positive for numbness and headaches. Negative for weakness.  Psychiatric/Behavioral: The patient is nervous/anxious.   All other systems reviewed and are negative.    Physical Exam Updated Vital Signs BP (!) 143/92   Pulse 82   Temp 98.1 F (36.7 C) (Oral)   Resp 11   Ht 1.626 m (5\' 4" )   Wt 109.8 kg   LMP 10/09/2014   SpO2 98%   BMI 41.54 kg/m  Physical Exam CONSTITUTIONAL: Well developed/well nourished, mildly anxious HEAD: Normocephalic/atraumatic EYES: EOMI/PERRL ENMT: Mucous membranes moist, no facial spasms (negative Chvostek sign) NECK: supple no meningeal signs SPINE/BACK:entire spine nontender CV: S1/S2 noted, no murmurs/rubs/gallops noted LUNGS: Lungs are clear to auscultation bilaterally, no apparent distress ABDOMEN: soft, nontender, no rebound or guarding, bowel sounds noted throughout abdomen GU:no cva tenderness NEURO: Pt is awake/alert/appropriate, moves all extremitiesx4.  No facial droop.  No arm or leg drift.  Equal handgrips noted.  No obvious spasms of the hands.  She reports mild numbness to bilateral surfaces of face.  No other numbness reported EXTREMITIES: pulses normal/equal, full ROM SKIN: warm, color normal PSYCH: Mildly anxious  ED Treatments / Results  Labs (all labs ordered are listed, but only abnormal results are displayed) Labs Reviewed  BASIC METABOLIC PANEL - Abnormal; Notable for the following components:      Result Value   CO2 21 (*)    Glucose, Bld 139 (*)    Calcium 8.5 (*)    All other components within normal limits  CBC WITH DIFFERENTIAL/PLATELET - Abnormal; Notable for the following components:   WBC 13.3 (*)    Neutro Abs 10.4 (*)    All other components within normal limits  I-STAT CHEM 8, ED - Abnormal; Notable for the following components:   BUN 5 (*)    Creatinine, Ser 0.40 (*)    Glucose, Bld 136 (*)    All other components within normal limits  MAGNESIUM  HEPATIC FUNCTION PANEL    I-STAT BETA HCG BLOOD, ED ([facility], [facility], [facility] ONLY)    EKG EKG Interpretation  Date/Time:  Saturday October 09 2018 02:54:34 EST Ventricular Rate:  88 PR Interval:    QRS Duration: 71 QT Interval:  377 QTC Calculation: 457 R Axis:   52 Text Interpretation:  Sinus rhythm Low voltage, precordial leads Borderline repolarization abnormality Baseline wander in lead(s) II No significant change since last tracing Confirmed by Ripley Fraise [phone]) on 10/09/2018 2:57:32 AM   Radiology No results found.  Procedures Procedures (including critical care time)  Medications Ordered in ED Medications  calcium carbonate (TUMS - dosed in mg elemental calcium) chewable tablet 200 mg of elemental calcium (200 mg of elemental calcium Oral Given 10/09/18 0345)     Initial Impression / Assessment and Plan / ED Course  I have reviewed the triage vital signs and the nursing notes.  Pertinent labs results that were available during my care of the patient were reviewed by me and considered in my medical decision making (see chart for details).     4:26 AM Patient with recent total thyroidectomy presenting with paresthesias in face/hand/feet.  She was concerned for hypocalcemia in the setting of recent thyroidectomy.  Also she had her Tums dosing decreased by endocrinology.  On laboratory assessment her calcium is mildly decreased from prior, but magnesium is normal.  She does not appear to meet threshold to start IV calcium.  She has been given oral Tums.  She has no neurologic findings on my exam, no acute EKG changes, QT is not prolonged 4:50 AM Patient is feeling improved.  At this point, we agreed to defer IV calcium.  She will go back to 2 Tums in the morning and 2 tabs at night.  Would like to recheck her calcium with a 24 to 48 hours, therefore she will return to the ER for lab check She will also call her endocrinologist in 48 hours. Discussed return precautions Final Clinical Impressions(s) /  ED  Diagnoses   Final diagnoses:  Paresthesia    ED Discharge Orders    None       Ripley Fraise, MD 10/09/18 [phone]

## 2018-10-12 ENCOUNTER — Encounter: Payer: Self-pay | Admitting: Endocrinology

## 2018-10-12 ENCOUNTER — Ambulatory Visit (INDEPENDENT_AMBULATORY_CARE_PROVIDER_SITE_OTHER): Payer: BLUE CROSS/BLUE SHIELD | Admitting: Endocrinology

## 2018-10-12 VITALS — BP 138/88 | HR 80 | Ht 64.0 in | Wt 246.2 lb

## 2018-10-12 DIAGNOSIS — E890 Postprocedural hypothyroidism: Secondary | ICD-10-CM

## 2018-10-12 DIAGNOSIS — E559 Vitamin D deficiency, unspecified: Secondary | ICD-10-CM

## 2018-10-12 LAB — BASIC METABOLIC PANEL
BUN: 10 mg/dL (ref 6–23)
CO2: 26 mEq/L (ref 19–32)
Calcium: 8.7 mg/dL (ref 8.4–10.5)
Chloride: 103 mEq/L (ref 96–112)
Creatinine, Ser: 0.67 mg/dL (ref 0.40–1.20)
GFR: 102.28 mL/min (ref >60.00)
Glucose, Bld: 120 mg/dL — ABNORMAL HIGH (ref 70–99)
Potassium: 4 mEq/L (ref 3.5–5.1)
Sodium: 139 mEq/L (ref 135–145)

## 2018-10-12 LAB — MAGNESIUM: Magnesium: 1.8 mg/dL (ref 1.5–2.5)

## 2018-10-12 LAB — T4, FREE: Free T4: 0.59 ng/dL — ABNORMAL LOW (ref 0.60–1.60)

## 2018-10-12 LAB — TSH: TSH: 2.15 u[IU]/mL (ref 0.35–4.50)

## 2018-10-12 LAB — PHOSPHORUS: PHOSPHORUS: 3.4 mg/dL (ref 2.3–4.6)

## 2018-10-12 LAB — VITAMIN D 25 HYDROXY (VIT D DEFICIENCY, FRACTURES): VITD: 32.82 ng/mL (ref 30.00–100.00)

## 2018-10-12 NOTE — Progress Notes (Signed)
Subjective:    Patient ID: Heather Fry, female    DOB: 1976/07/09, 43 y.o.   MRN: [id number]  HPI Pt returns for f/u of postsurgical hypothyroidism (she had thyroidectomy last week, for hyperthyroidism; she was rx'ed synthroid last week). Hypoparathyroidism (mild; Tums were reduced to 1-BID last week; she was seen in ER with slight weakness throughout the body, and assoc tingling. Tums were re-increased to 2-BID.  sxs are improved since then.  She takes vit-D, 2x5000 units/day.  She does take Mg++ tabs.  Past Medical History:  Diagnosis Date  . Anxiety   . Arthritis   . Depression   . Diverticulosis   . GERD (gastroesophageal reflux disease)   . Graves disease   . Internal hemorrhoids   . Thyroid disease   . Uterine fibroid     Past Surgical History:  Procedure Laterality Date  . ABDOMINAL HYSTERECTOMY     Per patient, uterine fibroids.  . ABDOMINAL HYSTERECTOMY    . CHOLECYSTECTOMY N/A 11/06/2014   Procedure: LAPAROSCOPIC CHOLECYSTECTOMY;  Surgeon: Jamesetta So, MD;  Location: [facility];  Service: General;  Laterality: N/A;  . COLONOSCOPY N/A 03/04/2017   Procedure: COLONOSCOPY;  Surgeon: Daneil Dolin, MD;  Location: [facility];  Service: Endoscopy;  Laterality: N/A;  8:30am  . DILATION AND CURETTAGE OF UTERUS    . ESOPHAGOGASTRODUODENOSCOPY N/A 10/02/2014   Procedure: ESOPHAGOGASTRODUODENOSCOPY (EGD);  Surgeon: Daneil Dolin, MD;  Location: [facility];  Service: Endoscopy;  Laterality: N/A;  945am - moved to 8:45 - Ginger notified pt  . MOUTH SURGERY     extraction of teeth  . POLYPECTOMY  03/04/2017   Procedure: POLYPECTOMY;  Surgeon: Daneil Dolin, MD;  Location: [facility];  Service: Endoscopy;;  colon  . THYROIDECTOMY N/A 09/30/2018   Procedure: TOTAL THYROIDECTOMY;  Surgeon: Armandina Gemma, MD;  Location: [facility];  Service: General;  Laterality: N/A;  . TUBAL LIGATION      Social History   Socioeconomic History  . Marital status: Married    Spouse name: Not  on file  . Number of children: Not on file  . Years of education: Not on file  . Highest education level: Not on file  Occupational History  . Not on file  Social Needs  . Financial resource strain: Not on file  . Food insecurity:    Worry: Not on file    Inability: Not on file  . Transportation needs:    Medical: Not on file    Non-medical: Not on file  Tobacco Use  . Smoking status: Former Smoker    Packs/day: 0.50    Years: 3.00    Pack years: 1.50    Types: Cigarettes    Last attempt to quit: 11/03/1997    Years since quitting: 20.9  . Smokeless tobacco: Never Used  . Tobacco comment: 1 pack 1 week  Substance and Sexual Activity  . Alcohol use: No    Alcohol/week: 0.0 standard drinks  . Drug use: No  . Sexual activity: Yes    Birth control/protection: Surgical  Lifestyle  . Physical activity:    Days per week: Not on file    Minutes per session: Not on file  . Stress: Not on file  Relationships  . Social connections:    Talks on phone: Not on file    Gets together: Not on file    Attends religious service: Not on file    Active member of club or organization:  Not on file    Attends meetings of clubs or organizations: Not on file    Relationship status: Not on file  . Intimate partner violence:    Fear of current or ex partner: Not on file    Emotionally abused: Not on file    Physically abused: Not on file    Forced sexual activity: Not on file  Other Topics Concern  . Not on file  Social History Narrative  . Not on file    Current Outpatient Medications on File Prior to Visit  Medication Sig Dispense Refill  . acetaminophen (TYLENOL) 500 MG tablet Take 1,000 mg by mouth daily as needed for moderate pain.    . Camphor-Eucalyptus-Menthol (VICKS VAPORUB EX) Apply 1 application topically daily as needed (congestion/sinus headaches).    . Cholecalciferol (VITAMIN D3) 125 MCG (5000 UT) CAPS Take 5,000 Units by mouth at bedtime.    . diphenhydrAMINE (BENADRYL)  25 MG tablet Take 25 mg by mouth every 6 (six) hours as needed (for sleep).    Marland Kitchen escitalopram (LEXAPRO) 20 MG tablet Take 1 tablet (20 mg total) by mouth at bedtime. 30 tablet 5  . levothyroxine (SYNTHROID, LEVOTHROID) 125 MCG tablet Take 1 tablet (125 mcg total) by mouth daily before breakfast. 30 tablet 3  . metoprolol tartrate (LOPRESSOR) 25 MG tablet Take 0.5 tablets (12.5 mg total) by mouth daily. (Patient taking differently: Take 6.25-12.5 mg by mouth every evening. If HR is over 90 take 12.5 mg, if under take 6.25 mg) 30 tablet 3  . Omega-3 Fatty Acids (FISH OIL) 1000 MG CAPS Take 1,000 mg by mouth at bedtime.    . pantoprazole (PROTONIX) 40 MG tablet Take 40 mg by mouth every evening.     . traMADol (ULTRAM) 50 MG tablet Take 1-2 tablets (50-100 mg total) by mouth every 6 (six) hours as needed. 15 tablet 0  . Wheat Dextrin (BENEFIBER DRINK MIX PO) Take 1 Dose by mouth 3 (three) times daily.     Marland Kitchen loratadine-pseudoephedrine (CLARITIN-D 12-HOUR) 5-120 MG tablet Take 1 tablet by mouth daily as needed (cold symptoms).    . meclizine (ANTIVERT) 25 MG tablet Take 25 mg by mouth 3 (three) times daily as needed for dizziness.    . simethicone (PHAZYME) 125 MG chewable tablet Chew 125-250 mg by mouth every 6 (six) hours as needed for flatulence.      No current facility-administered medications on file prior to visit.     Allergies  Allergen Reactions  . Levofloxacin Other (See Comments)    Pt can not have due to taking Lexapro   . Penicillins Other (See Comments)    Loopy, childhood allergy DID THE REACTION INVOLVE: Swelling of the face/tongue/throat, SOB, or low BP? Unknown Sudden or severe rash/hives, skin peeling, or the inside of the mouth or nose? Unknown Did it require medical treatment? Yes When did it last happen?Childhood allergy If all above answers are "NO", may proceed with cephalosporin use.     Family History  Problem Relation Age of Onset  . Hypertension Father     . Diabetes Father   . Depression Other   . Uterine cancer Mother        ?cervical cancer  . Uterine cancer Sister        suicide  . Colon cancer Paternal Aunt 78  . Thyroid disease Neg Hx     BP 138/88 (BP Location: Left Arm, Patient Position: Sitting, Cuff Size: Normal)   Pulse 80  Ht 5\' 4"  (1.626 m)   Wt 246 lb 3.2 oz (111.7 kg)   LMP 10/09/2014   SpO2 98%   BMI 42.26 kg/m    Review of Systems Denies neck pain.  Cramps are resolved.      Objective:   Physical Exam VITAL SIGNS:  See vs page GENERAL: no distress. Ext: no leg edema. GAIT: normal and steady.    Lab Results  Component Value Date   CREATININE 0.67 10/12/2018   BUN 10 10/12/2018   NA 139 10/12/2018   K 4.0 10/12/2018   CL 103 10/12/2018   CO2 26 10/12/2018   Lab Results  Component Value Date   PTH 24 10/12/2018   CALCIUM 8.7 10/12/2018   CALCIUM 8.8 10/12/2018   CAION 1.15 10/09/2018   PHOS 3.4 10/12/2018       Assessment & Plan:  Postsurgical hypothyroidism: on rx.  Waiting for this to stabilize might help symptoms.  Hypoparathyroidism: mild.  I told pt this is almost always transient. We discussed.  We'll plan to give it more time before trying to reduce Tums again.    Patient Instructions  blood tests are requested for you today.  We'll let you know about the results.   Please come back for a follow-up appointment in 1 month.

## 2018-10-12 NOTE — Patient Instructions (Addendum)
blood tests are requested for you today.  We'll let you know about the results.  Please come back for a follow-up appointment in 1 month.  

## 2018-10-13 LAB — PTH, INTACT AND CALCIUM
CALCIUM: 8.8 mg/dL (ref 8.6–10.2)
PTH: 24 pg/mL (ref 14–64)

## 2018-11-02 DIAGNOSIS — I10 Essential (primary) hypertension: Secondary | ICD-10-CM | POA: Diagnosis not present

## 2018-11-02 DIAGNOSIS — E7849 Other hyperlipidemia: Secondary | ICD-10-CM | POA: Diagnosis not present

## 2018-11-02 DIAGNOSIS — E0590 Thyrotoxicosis, unspecified without thyrotoxic crisis or storm: Secondary | ICD-10-CM | POA: Diagnosis not present

## 2018-11-02 DIAGNOSIS — Z1389 Encounter for screening for other disorder: Secondary | ICD-10-CM | POA: Diagnosis not present

## 2018-11-02 DIAGNOSIS — Z6841 Body Mass Index (BMI) 40.0 and over, adult: Secondary | ICD-10-CM | POA: Diagnosis not present

## 2018-11-09 ENCOUNTER — Ambulatory Visit (INDEPENDENT_AMBULATORY_CARE_PROVIDER_SITE_OTHER): Payer: BLUE CROSS/BLUE SHIELD | Admitting: Endocrinology

## 2018-11-09 ENCOUNTER — Encounter: Payer: Self-pay | Admitting: Endocrinology

## 2018-11-09 VITALS — BP 130/82 | HR 92 | Resp 18 | Ht 64.0 in | Wt 250.6 lb

## 2018-11-09 DIAGNOSIS — E559 Vitamin D deficiency, unspecified: Secondary | ICD-10-CM | POA: Diagnosis not present

## 2018-11-09 DIAGNOSIS — E890 Postprocedural hypothyroidism: Secondary | ICD-10-CM | POA: Diagnosis not present

## 2018-11-09 LAB — BASIC METABOLIC PANEL
BUN: 11 mg/dL (ref 6–23)
CO2: 30 mEq/L (ref 19–32)
Calcium: 9.3 mg/dL (ref 8.4–10.5)
Chloride: 101 mEq/L (ref 96–112)
Creatinine, Ser: 0.6 mg/dL (ref 0.40–1.20)
GFR: 109.26 mL/min (ref >60.00)
Glucose, Bld: 103 mg/dL — ABNORMAL HIGH (ref 70–99)
POTASSIUM: 3.9 meq/L (ref 3.5–5.1)
Sodium: 138 mEq/L (ref 135–145)

## 2018-11-09 LAB — MAGNESIUM: Magnesium: 1.8 mg/dL (ref 1.5–2.5)

## 2018-11-09 LAB — TSH: TSH: 1.97 u[IU]/mL (ref 0.35–4.50)

## 2018-11-09 LAB — PHOSPHORUS: Phosphorus: 3.3 mg/dL (ref 2.3–4.6)

## 2018-11-09 LAB — T3, FREE: T3, Free: 3.6 pg/mL (ref 2.3–4.2)

## 2018-11-09 NOTE — Progress Notes (Signed)
Subjective:    Patient ID: Heather Fry, female    DOB: [birth year], 43 y.o.   MRN: [id number]  HPI Pt returns for f/u of postsurgical hypothyroidism (she had thyroidectomy last week, for hyperthyroidism; she was rx'ed synthroid soon thereafter).  She takes synthroid as rx'ed.   Hypoparathyroidism (she takes Tums 2-BID; in 1/20, she was seen in ER with slight tingling sxs.  She still takes vit-D, 2x5000 units/day.  She takes Mg++ tabs, 250 mg/d.   Past Medical History:  Diagnosis Date  . Anxiety   . Arthritis   . Depression   . Diverticulosis   . GERD (gastroesophageal reflux disease)   . Graves disease   . Internal hemorrhoids   . Thyroid disease   . Uterine fibroid     Past Surgical History:  Procedure Laterality Date  . ABDOMINAL HYSTERECTOMY     Per patient, uterine fibroids.  . ABDOMINAL HYSTERECTOMY    . CHOLECYSTECTOMY N/A 11/06/2014   Procedure: LAPAROSCOPIC CHOLECYSTECTOMY;  Surgeon: Jamesetta So, MD;  Location: [facility];  Service: General;  Laterality: N/A;  . COLONOSCOPY N/A 03/04/2017   Procedure: COLONOSCOPY;  Surgeon: Daneil Dolin, MD;  Location: [facility];  Service: Endoscopy;  Laterality: N/A;  8:30am  . DILATION AND CURETTAGE OF UTERUS    . ESOPHAGOGASTRODUODENOSCOPY N/A 10/02/2014   Procedure: ESOPHAGOGASTRODUODENOSCOPY (EGD);  Surgeon: Daneil Dolin, MD;  Location: [facility];  Service: Endoscopy;  Laterality: N/A;  945am - moved to 8:45 - Ginger notified pt  . MOUTH SURGERY     extraction of teeth  . POLYPECTOMY  03/04/2017   Procedure: POLYPECTOMY;  Surgeon: Daneil Dolin, MD;  Location: [facility];  Service: Endoscopy;;  colon  . THYROIDECTOMY N/A 09/30/2018   Procedure: TOTAL THYROIDECTOMY;  Surgeon: Armandina Gemma, MD;  Location: [facility];  Service: General;  Laterality: N/A;  . TUBAL LIGATION      Social History   Socioeconomic History  . Marital status: Married    Spouse name: Not on file  . Number of children: Not on file  . Years of  education: Not on file  . Highest education level: Not on file  Occupational History  . Not on file  Social Needs  . Financial resource strain: Not on file  . Food insecurity:    Worry: Not on file    Inability: Not on file  . Transportation needs:    Medical: Not on file    Non-medical: Not on file  Tobacco Use  . Smoking status: Former Smoker    Packs/day: 0.50    Years: 3.00    Pack years: 1.50    Types: Cigarettes    Last attempt to quit: 11/03/1997    Years since quitting: 21.0  . Smokeless tobacco: Never Used  . Tobacco comment: 1 pack 1 week  Substance and Sexual Activity  . Alcohol use: No    Alcohol/week: 0.0 standard drinks  . Drug use: No  . Sexual activity: Yes    Birth control/protection: Surgical  Lifestyle  . Physical activity:    Days per week: Not on file    Minutes per session: Not on file  . Stress: Not on file  Relationships  . Social connections:    Talks on phone: Not on file    Gets together: Not on file    Attends religious service: Not on file    Active member of club or organization: Not on file    Attends  meetings of clubs or organizations: Not on file    Relationship status: Not on file  . Intimate partner violence:    Fear of current or ex partner: Not on file    Emotionally abused: Not on file    Physically abused: Not on file    Forced sexual activity: Not on file  Other Topics Concern  . Not on file  Social History Narrative  . Not on file    Current Outpatient Medications on File Prior to Visit  Medication Sig Dispense Refill  . acetaminophen (TYLENOL) 500 MG tablet Take 1,000 mg by mouth daily as needed for moderate pain.    . Camphor-Eucalyptus-Menthol (VICKS VAPORUB EX) Apply 1 application topically daily as needed (congestion/sinus headaches).    . Cholecalciferol (VITAMIN D3) 125 MCG (5000 UT) CAPS Take 5,000 Units by mouth at bedtime.    Marland Kitchen escitalopram (LEXAPRO) 20 MG tablet Take 1 tablet (20 mg total) by mouth at bedtime.  30 tablet 5  . levothyroxine (SYNTHROID, LEVOTHROID) 125 MCG tablet Take 1 tablet (125 mcg total) by mouth daily before breakfast. 30 tablet 3  . losartan (COZAAR) 50 MG tablet Take 50 mg by mouth daily.    . Magnesium 250 MG TABS Take by mouth.    . Omega-3 Fatty Acids (FISH OIL) 1000 MG CAPS Take 1,000 mg by mouth at bedtime.    . pantoprazole (PROTONIX) 40 MG tablet Take 40 mg by mouth every evening.     . simethicone (PHAZYME) 125 MG chewable tablet Chew 125-250 mg by mouth every 6 (six) hours as needed for flatulence.     . Wheat Dextrin (BENEFIBER DRINK MIX PO) Take 1 Dose by mouth 3 (three) times daily.      No current facility-administered medications on file prior to visit.     Allergies  Allergen Reactions  . Levofloxacin Other (See Comments)    Pt can not have due to taking Lexapro   . Penicillins Other (See Comments)    Loopy, childhood allergy DID THE REACTION INVOLVE: Swelling of the face/tongue/throat, SOB, or low BP? Unknown Sudden or severe rash/hives, skin peeling, or the inside of the mouth or nose? Unknown Did it require medical treatment? Yes When did it last happen?Childhood allergy If all above answers are "NO", may proceed with cephalosporin use.     Family History  Problem Relation Age of Onset  . Hypertension Father   . Diabetes Father   . Depression Other   . Uterine cancer Mother        ?cervical cancer  . Uterine cancer Sister        suicide  . Colon cancer Paternal Aunt 81  . Thyroid disease Neg Hx     BP 130/82 (BP Location: Left Arm, Patient Position: Sitting, Cuff Size: Large)   Pulse 92   Resp 18   Ht 5\' 4"  (1.626 m)   Wt 250 lb 9.6 oz (113.7 kg)   LMP 10/09/2014   SpO2 95%   BMI 43.02 kg/m    Review of Systems Leg cramps are less now.  Denies neck pain.       Objective:   Physical Exam VITAL SIGNS:  See vs page GENERAL: no distress Neck: a healed scar is present.  I do not appreciate a nodule in the thyroid or elsewhere  in the neck.    Lab Results  Component Value Date   PTH 10 (L) 11/09/2018   CALCIUM 9.3 11/09/2018   CALCIUM 9.4 11/09/2018  CAION 1.15 10/09/2018   PHOS 3.3 11/09/2018   Lab Results  Component Value Date   TSH 1.97 11/09/2018      Assessment & Plan:  Hypoparathyroidism: as she tolerated discontinuation of Ca++ poorly last month, we'll reduce slowly this time.  Hypothyroidism: well-replaced  Patient Instructions  blood tests are requested for you today.  We'll let you know about the results.   Our plan will be to slowly decrease the Tums if we can.  Please come back for a follow-up appointment in 1 month.

## 2018-11-09 NOTE — Patient Instructions (Addendum)
blood tests are requested for you today.  We'll let you know about the results.   Our plan will be to slowly decrease the Tums if we can.  Please come back for a follow-up appointment in 1 month.

## 2018-11-10 LAB — T4, FREE: Free T4: 0.75 ng/dL (ref 0.60–1.60)

## 2018-11-10 LAB — VITAMIN D 25 HYDROXY (VIT D DEFICIENCY, FRACTURES): VITD: 38.02 ng/mL (ref 30.00–100.00)

## 2018-11-10 LAB — PTH, INTACT AND CALCIUM
Calcium: 9.4 mg/dL (ref 8.6–10.2)
PTH: 10 pg/mL — ABNORMAL LOW (ref 14–64)

## 2018-11-23 DIAGNOSIS — M9903 Segmental and somatic dysfunction of lumbar region: Secondary | ICD-10-CM | POA: Diagnosis not present

## 2018-11-23 DIAGNOSIS — M62830 Muscle spasm of back: Secondary | ICD-10-CM | POA: Diagnosis not present

## 2018-11-23 DIAGNOSIS — M9902 Segmental and somatic dysfunction of thoracic region: Secondary | ICD-10-CM | POA: Diagnosis not present

## 2018-11-23 DIAGNOSIS — M9901 Segmental and somatic dysfunction of cervical region: Secondary | ICD-10-CM | POA: Diagnosis not present

## 2018-11-24 DIAGNOSIS — M9902 Segmental and somatic dysfunction of thoracic region: Secondary | ICD-10-CM | POA: Diagnosis not present

## 2018-11-24 DIAGNOSIS — M9903 Segmental and somatic dysfunction of lumbar region: Secondary | ICD-10-CM | POA: Diagnosis not present

## 2018-11-24 DIAGNOSIS — M62830 Muscle spasm of back: Secondary | ICD-10-CM | POA: Diagnosis not present

## 2018-11-24 DIAGNOSIS — M9901 Segmental and somatic dysfunction of cervical region: Secondary | ICD-10-CM | POA: Diagnosis not present

## 2018-11-25 DIAGNOSIS — M9902 Segmental and somatic dysfunction of thoracic region: Secondary | ICD-10-CM | POA: Diagnosis not present

## 2018-11-25 DIAGNOSIS — M9901 Segmental and somatic dysfunction of cervical region: Secondary | ICD-10-CM | POA: Diagnosis not present

## 2018-11-25 DIAGNOSIS — M62830 Muscle spasm of back: Secondary | ICD-10-CM | POA: Diagnosis not present

## 2018-11-25 DIAGNOSIS — M9903 Segmental and somatic dysfunction of lumbar region: Secondary | ICD-10-CM | POA: Diagnosis not present

## 2018-11-29 DIAGNOSIS — M62830 Muscle spasm of back: Secondary | ICD-10-CM | POA: Diagnosis not present

## 2018-11-29 DIAGNOSIS — M9902 Segmental and somatic dysfunction of thoracic region: Secondary | ICD-10-CM | POA: Diagnosis not present

## 2018-11-29 DIAGNOSIS — M9903 Segmental and somatic dysfunction of lumbar region: Secondary | ICD-10-CM | POA: Diagnosis not present

## 2018-11-29 DIAGNOSIS — M9901 Segmental and somatic dysfunction of cervical region: Secondary | ICD-10-CM | POA: Diagnosis not present

## 2018-11-30 ENCOUNTER — Ambulatory Visit (INDEPENDENT_AMBULATORY_CARE_PROVIDER_SITE_OTHER): Payer: BLUE CROSS/BLUE SHIELD | Admitting: Endocrinology

## 2018-11-30 ENCOUNTER — Encounter: Payer: Self-pay | Admitting: Endocrinology

## 2018-11-30 VITALS — BP 132/88 | HR 116 | Ht 64.0 in | Wt 247.0 lb

## 2018-11-30 DIAGNOSIS — R202 Paresthesia of skin: Secondary | ICD-10-CM | POA: Diagnosis not present

## 2018-11-30 DIAGNOSIS — R232 Flushing: Secondary | ICD-10-CM | POA: Diagnosis not present

## 2018-11-30 DIAGNOSIS — E890 Postprocedural hypothyroidism: Secondary | ICD-10-CM

## 2018-11-30 DIAGNOSIS — E559 Vitamin D deficiency, unspecified: Secondary | ICD-10-CM | POA: Diagnosis not present

## 2018-11-30 DIAGNOSIS — N951 Menopausal and female climacteric states: Secondary | ICD-10-CM | POA: Insufficient documentation

## 2018-11-30 LAB — MAGNESIUM: Magnesium: 1.7 mg/dL (ref 1.5–2.5)

## 2018-11-30 LAB — TSH: TSH: 0.66 u[IU]/mL (ref 0.35–4.50)

## 2018-11-30 LAB — CORTISOL: Cortisol, Plasma: 6.1 ug/dL

## 2018-11-30 LAB — BASIC METABOLIC PANEL WITH GFR
BUN: 8 mg/dL (ref 6–23)
CO2: 25 meq/L (ref 19–32)
Calcium: 9.4 mg/dL (ref 8.4–10.5)
Chloride: 102 meq/L (ref 96–112)
Creatinine, Ser: 0.63 mg/dL (ref 0.40–1.20)
GFR: 103.25 mL/min
Glucose, Bld: 132 mg/dL — ABNORMAL HIGH (ref 70–99)
Potassium: 3.8 meq/L (ref 3.5–5.1)
Sodium: 137 meq/L (ref 135–145)

## 2018-11-30 LAB — PHOSPHORUS: Phosphorus: 3 mg/dL (ref 2.3–4.6)

## 2018-11-30 NOTE — Progress Notes (Signed)
Subjective:    Patient ID: Heather Fry, female    DOB: 1975-11-12, 43 y.o.   MRN: [id number]  HPI Pt returns for f/u of postsurgical hypothyroidism (she had thyroidectomy last week, for hyperthyroidism; she was rx'ed synthroid soon thereafter).  She takes synthroid as rx'ed.   She also has mild postsurgical hypocalcemia (she takes Tums 2-BID; in 1/20, she was seen in ER with facial tingling sxs after it was d/c'ed).  She still takes vit-D, 2x5000 units/day.  She takes Mg++ tabs, 250 mg/d.  She reports moderate flushing sensation in the face, and assoc tingling sensation.   Past Medical History:  Diagnosis Date  . Anxiety   . Arthritis   . Depression   . Diverticulosis   . GERD (gastroesophageal reflux disease)   . Graves disease   . Internal hemorrhoids   . Thyroid disease   . Uterine fibroid     Past Surgical History:  Procedure Laterality Date  . ABDOMINAL HYSTERECTOMY     Per patient, uterine fibroids.  . ABDOMINAL HYSTERECTOMY    . CHOLECYSTECTOMY N/A 11/06/2014   Procedure: LAPAROSCOPIC CHOLECYSTECTOMY;  Surgeon: Jamesetta So, MD;  Location: [facility];  Service: General;  Laterality: N/A;  . COLONOSCOPY N/A 03/04/2017   Procedure: COLONOSCOPY;  Surgeon: Daneil Dolin, MD;  [facility];  Service: Endoscopy;  Laterality: N/A;  8:30am  . DILATION AND CURETTAGE OF UTERUS    . ESOPHAGOGASTRODUODENOSCOPY N/A 10/02/2014   Procedure: ESOPHAGOGASTRODUODENOSCOPY (EGD);  Surgeon: Daneil Dolin, MD;  [facility];  Service: Endoscopy;  Laterality: N/A;  945am - moved to 8:45 - Ginger notified pt  . MOUTH SURGERY     extraction of teeth  . POLYPECTOMY  03/04/2017   Procedure: POLYPECTOMY;  Surgeon: Daneil Dolin, MD;  Location: [facility];  Service: Endoscopy;;  colon  . THYROIDECTOMY N/A 09/30/2018   Procedure: TOTAL THYROIDECTOMY;  Surgeon: Armandina Gemma, MD;  Location: [facility];  Service: General;  Laterality: N/A;  . TUBAL LIGATION      Social History    Socioeconomic History  . Marital status: Married    Spouse name: Not on file  . Number of children: Not on file  . Years of education: Not on file  . Highest education level: Not on file  Occupational History  . Not on file  Social Needs  . Financial resource strain: Not on file  . Food insecurity:    Worry: Not on file    Inability: Not on file  . Transportation needs:    Medical: Not on file    Non-medical: Not on file  Tobacco Use  . Smoking status: Former Smoker    Packs/day: 0.50    Years: 3.00    Pack years: 1.50    Types: Cigarettes    Last attempt to quit: 11/03/1997    Years since quitting: 21.0  . Smokeless tobacco: Never Used  . Tobacco comment: 1 pack 1 week  Substance and Sexual Activity  . Alcohol use: No    Alcohol/week: 0.0 standard drinks  . Drug use: No  . Sexual activity: Yes    Birth control/protection: Surgical  Lifestyle  . Physical activity:    Days per week: Not on file    Minutes per session: Not on file  . Stress: Not on file  Relationships  . Social connections:    Talks on phone: Not on file    Gets together: Not on file  Attends religious service: Not on file    Active member of club or organization: Not on file    Attends meetings of clubs or organizations: Not on file    Relationship status: Not on file  . Intimate partner violence:    Fear of current or ex partner: Not on file    Emotionally abused: Not on file    Physically abused: Not on file    Forced sexual activity: Not on file  Other Topics Concern  . Not on file  Social History Narrative  . Not on file    Current Outpatient Medications on File Prior to Visit  Medication Sig Dispense Refill  . acetaminophen (TYLENOL) 500 MG tablet Take 1,000 mg by mouth daily as needed for moderate pain.    . Camphor-Eucalyptus-Menthol (VICKS VAPORUB EX) Apply 1 application topically daily as needed (congestion/sinus headaches).    . Cholecalciferol (VITAMIN D3) 125 MCG (5000 UT)  CAPS Take 5,000 Units by mouth at bedtime.    Marland Kitchen escitalopram (LEXAPRO) 20 MG tablet Take 1 tablet (20 mg total) by mouth at bedtime. 30 tablet 5  . levothyroxine (SYNTHROID, LEVOTHROID) 125 MCG tablet Take 1 tablet (125 mcg total) by mouth daily before breakfast. 30 tablet 3  . losartan (COZAAR) 50 MG tablet Take 50 mg by mouth daily.    . Magnesium 250 MG TABS Take by mouth.    . Omega-3 Fatty Acids (FISH OIL) 1000 MG CAPS Take 1,000 mg by mouth at bedtime.    . pantoprazole (PROTONIX) 40 MG tablet Take 40 mg by mouth every evening.     . simethicone (PHAZYME) 125 MG chewable tablet Chew 125-250 mg by mouth every 6 (six) hours as needed for flatulence.     . Wheat Dextrin (BENEFIBER DRINK MIX PO) Take 1 Dose by mouth 3 (three) times daily.      No current facility-administered medications on file prior to visit.     Family History  Problem Relation Age of Onset  . Hypertension Father   . Diabetes Father   . Depression Other   . Uterine cancer Mother        ?cervical cancer  . Uterine cancer Sister        suicide  . Colon cancer Paternal Aunt 14  . Thyroid disease Neg Hx     BP 132/88 (BP Location: Left Arm, Patient Position: Sitting, Cuff Size: Large)   Pulse (!) 116   Ht 5\' 4"  (1.626 m)   Wt 247 lb (112 kg)   LMP 10/09/2014   SpO2 94%   BMI 42.40 kg/m    Review of Systems She has excessive appetite and muscle weakness.    Objective:   Physical Exam VITAL SIGNS:  See vs page.   GENERAL: no distress.  Skin: not diaphoretic.   Neck: a healing scar is present.  I do not appreciate a nodule in the thyroid or elsewhere in the neck. NEURO: normal muscle strength in the face.     CT abd (2019): adrenals are normal    Assessment & Plan:  Hypocalcemia: which is prob resolved.  Paresthesias: worse.  uncertain etiology.   Patient Instructions  Blood and urine tests are requested for you today.  We'll let you know about the results.  Please come back for a follow-up  appointment in 1 month.

## 2018-11-30 NOTE — Patient Instructions (Signed)
Blood and urine tests are requested for you today.  We'll let you know about the results.  Please come back for a follow-up appointment in 1 month.

## 2018-12-01 DIAGNOSIS — M9902 Segmental and somatic dysfunction of thoracic region: Secondary | ICD-10-CM | POA: Diagnosis not present

## 2018-12-01 DIAGNOSIS — M9901 Segmental and somatic dysfunction of cervical region: Secondary | ICD-10-CM | POA: Diagnosis not present

## 2018-12-01 DIAGNOSIS — M62830 Muscle spasm of back: Secondary | ICD-10-CM | POA: Diagnosis not present

## 2018-12-01 DIAGNOSIS — M9903 Segmental and somatic dysfunction of lumbar region: Secondary | ICD-10-CM | POA: Diagnosis not present

## 2018-12-01 LAB — FOLLICLE STIMULATING HORMONE: FSH: 8.8 m[IU]/mL

## 2018-12-01 LAB — VITAMIN D 25 HYDROXY (VIT D DEFICIENCY, FRACTURES): VITD: 52.12 ng/mL (ref 30.00–100.00)

## 2018-12-01 LAB — T4, FREE: Free T4: 0.79 ng/dL (ref 0.60–1.60)

## 2018-12-01 LAB — LUTEINIZING HORMONE: LH: 4.34 m[IU]/mL

## 2018-12-06 ENCOUNTER — Ambulatory Visit (INDEPENDENT_AMBULATORY_CARE_PROVIDER_SITE_OTHER): Payer: BLUE CROSS/BLUE SHIELD | Admitting: Endocrinology

## 2018-12-06 ENCOUNTER — Other Ambulatory Visit: Payer: Self-pay

## 2018-12-06 ENCOUNTER — Encounter: Payer: Self-pay | Admitting: Endocrinology

## 2018-12-06 DIAGNOSIS — R232 Flushing: Secondary | ICD-10-CM

## 2018-12-06 LAB — CATECHOLAMINES, FRACTIONATED, PLASMA
Catecholamines, Total: 512 pg/mL
Epinephrine: 32 pg/mL
Norepinephrine: 480 pg/mL

## 2018-12-06 LAB — METANEPHRINES, PLASMA
Metanephrine, Free: <25 pg/mL (ref <=57)
Normetanephrine, Free: 88 pg/mL (ref <=148)
Total Metanephrines-Plasma: 88 pg/mL (ref <=205)

## 2018-12-06 LAB — PTH, INTACT AND CALCIUM
Calcium: 9.4 mg/dL (ref 8.6–10.2)
PTH: 21 pg/mL (ref 14–64)

## 2018-12-06 MED ORDER — LEVOTHYROXINE SODIUM 112 MCG PO TABS: 112.0000 ug | ORAL_TABLET | Freq: Every day | ORAL | 11 refills | Status: DC

## 2018-12-06 NOTE — Patient Instructions (Signed)
I have sent a prescription to your pharmacy, to slightly reduce the thyroid pill. Please continue the same other medications. I'll see you next time.

## 2018-12-06 NOTE — Progress Notes (Signed)
Subjective:    Patient ID: Heather Fry, female    DOB: 1975-11-16, 43 y.o.   MRN: [id number]  HPI Pt returns for f/u of postsurgical hypothyroidism (she had thyroidectomy 1/20, for hyperthyroidism; she was rx'ed synthroid soon thereafter).  She takes synthroid as rx'ed.   She also has mild postsurgical hypocalcemia (she takes Tums 2-BID; in 1/20, she was seen in ER with facial tingling sxs after it was d/c'ed).  She takes vit-D, 2x5000 units/day.  She takes Mg++ tabs, 250 mg/d.   She has slight palpitations in the chest, but no assoc tremor.  She says HR at home varies from 75-95.   Past Medical History:  Diagnosis Date  . Anxiety   . Arthritis   . Depression   . Diverticulosis   . GERD (gastroesophageal reflux disease)   . Graves disease   . Internal hemorrhoids   . Thyroid disease   . Uterine fibroid     Past Surgical History:  Procedure Laterality Date  . ABDOMINAL HYSTERECTOMY     Per patient, uterine fibroids.  . ABDOMINAL HYSTERECTOMY    . CHOLECYSTECTOMY N/A 11/06/2014   Procedure: LAPAROSCOPIC CHOLECYSTECTOMY;  Surgeon: Jamesetta So, MD;  Location: [facility];  Service: General;  Laterality: N/A;  . COLONOSCOPY N/A 03/04/2017   Procedure: COLONOSCOPY;  Surgeon: Daneil Dolin, MD;  [facility];  Service: Endoscopy;  Laterality: N/A;  8:30am  . DILATION AND CURETTAGE OF UTERUS    . ESOPHAGOGASTRODUODENOSCOPY N/A 10/02/2014   Procedure: ESOPHAGOGASTRODUODENOSCOPY (EGD);  Surgeon: Daneil Dolin, MD;  [facility];  Service: Endoscopy;  Laterality: N/A;  945am - moved to 8:45 - Ginger notified pt  . MOUTH SURGERY     extraction of teeth  . POLYPECTOMY  03/04/2017   Procedure: POLYPECTOMY;  Surgeon: Daneil Dolin, MD;  [facility];  Service: Endoscopy;;  colon  . THYROIDECTOMY N/A 09/30/2018   Procedure: TOTAL THYROIDECTOMY;  Surgeon: Armandina Gemma, MD;  Location: [facility];  Service: General;  Laterality: N/A;  . TUBAL LIGATION      Social  History   Socioeconomic History  . Marital status: Married    Spouse name: Not on file  . Number of children: Not on file  . Years of education: Not on file  . Highest education level: Not on file  Occupational History  . Not on file  Social Needs  . Financial resource strain: Not on file  . Food insecurity:    Worry: Not on file    Inability: Not on file  . Transportation needs:    Medical: Not on file    Non-medical: Not on file  Tobacco Use  . Smoking status: Former Smoker    Packs/day: 0.50    Years: 3.00    Pack years: 1.50    Types: Cigarettes    Last attempt to quit: 11/03/1997    Years since quitting: 21.1  . Smokeless tobacco: Never Used  . Tobacco comment: 1 pack 1 week  Substance and Sexual Activity  . Alcohol use: No    Alcohol/week: 0.0 standard drinks  . Drug use: No  . Sexual activity: Yes    Birth control/protection: Surgical  Lifestyle  . Physical activity:    Days per week: Not on file    Minutes per session: Not on file  . Stress: Not on file  Relationships  . Social connections:    Talks on phone: Not on file    Gets  together: Not on file    Attends religious service: Not on file    Active member of club or organization: Not on file    Attends meetings of clubs or organizations: Not on file    Relationship status: Not on file  . Intimate partner violence:    Fear of current or ex partner: Not on file    Emotionally abused: Not on file    Physically abused: Not on file    Forced sexual activity: Not on file  Other Topics Concern  . Not on file  Social History Narrative  . Not on file    Current Outpatient Medications on File Prior to Visit  Medication Sig Dispense Refill  . acetaminophen (TYLENOL) 500 MG tablet Take 1,000 mg by mouth daily as needed for moderate pain.    . Camphor-Eucalyptus-Menthol (VICKS VAPORUB EX) Apply 1 application topically daily as needed (congestion/sinus headaches).    . Cholecalciferol (VITAMIN D3) 125 MCG  (5000 UT) CAPS Take 5,000 Units by mouth at bedtime.    Marland Kitchen escitalopram (LEXAPRO) 20 MG tablet Take 1 tablet (20 mg total) by mouth at bedtime. 30 tablet 5  . losartan (COZAAR) 50 MG tablet Take 50 mg by mouth daily.    . Magnesium 250 MG TABS Take by mouth.    . Omega-3 Fatty Acids (FISH OIL) 1000 MG CAPS Take 1,000 mg by mouth at bedtime.    . pantoprazole (PROTONIX) 40 MG tablet Take 40 mg by mouth every evening.     . simethicone (PHAZYME) 125 MG chewable tablet Chew 125-250 mg by mouth every 6 (six) hours as needed for flatulence.     . Wheat Dextrin (BENEFIBER DRINK MIX PO) Take 1 Dose by mouth 3 (three) times daily.      No current facility-administered medications on file prior to visit.     Allergies  Allergen Reactions  . Levofloxacin Other (See Comments)    Pt can not have due to taking Lexapro   . Penicillins Other (See Comments)    Loopy, childhood allergy DID THE REACTION INVOLVE: Swelling of the face/tongue/throat, SOB, or low BP? Unknown Sudden or severe rash/hives, skin peeling, or the inside of the mouth or nose? Unknown Did it require medical treatment? Yes When did it last happen?Childhood allergy If all above answers are "NO", may proceed with cephalosporin use.     Family History  Problem Relation Age of Onset  . Hypertension Father   . Diabetes Father   . Depression Other   . Uterine cancer Mother        ?cervical cancer  . Uterine cancer Sister        suicide  . Colon cancer Paternal Aunt 53  . Thyroid disease Neg Hx     BP 132/82 (BP Location: Left Arm, Patient Position: Sitting, Cuff Size: Large)   Pulse (!) 122   Ht 5\' 4"  (1.626 m)   Wt 250 lb 9.6 oz (113.7 kg)   LMP 10/09/2014   SpO2 95%   BMI 43.02 kg/m   Review of Systems Denies fever.  She has gained a few lbs.      Objective:   Physical Exam VITAL SIGNS:  See vs page GENERAL: no distress LUNGS:  Clear to auscultation HEART:  Regular rate and rhythm without murmurs noted.  Normal S1,S2.      Lab Results  Component Value Date   TSH 0.66 11/30/2018   Plasma catechols: normal.     Assessment & Plan:  Palpitations: new.  We'll try reducing the synthroid. Diaphoresis: we are awaiting urine catechol results.    Patient Instructions  I have sent a prescription to your pharmacy, to slightly reduce the thyroid pill. Please continue the same other medications. I'll see you next time.

## 2018-12-07 DIAGNOSIS — M9901 Segmental and somatic dysfunction of cervical region: Secondary | ICD-10-CM | POA: Diagnosis not present

## 2018-12-07 DIAGNOSIS — M62830 Muscle spasm of back: Secondary | ICD-10-CM | POA: Diagnosis not present

## 2018-12-07 DIAGNOSIS — M9902 Segmental and somatic dysfunction of thoracic region: Secondary | ICD-10-CM | POA: Diagnosis not present

## 2018-12-07 DIAGNOSIS — M9903 Segmental and somatic dysfunction of lumbar region: Secondary | ICD-10-CM | POA: Diagnosis not present

## 2018-12-08 ENCOUNTER — Ambulatory Visit: Payer: BLUE CROSS/BLUE SHIELD | Admitting: Endocrinology

## 2018-12-11 LAB — CATECHOLAMINES, FRACTIONATED, URINE, 24 HOUR
Calculated Total (E+NE): 46 mcg/24 h (ref 26–121)
Creatinine, Urine mg/day-CATEUR: 1.4 g/(24.h) (ref 0.50–2.15)
Dopamine, 24 hr Urine: 193 mcg/24 h (ref 52–480)
Norepinephrine, 24 hr Ur: 46 mcg/24 h (ref 15–100)
Volume, Urine-VMAUR: 1750 mL

## 2018-12-11 LAB — METANEPHRINES, URINE, 24 HOUR
Metaneph Total, Ur: 563 mcg/24 h (ref 182–739)
Metanephrines, Ur: 120 mcg/24 h (ref 58–203)
Normetanephrine, 24H Ur: 443 mcg/24 h (ref 88–649)
Volume, Urine-VMAUR: 1750 mL

## 2018-12-17 DIAGNOSIS — H04123 Dry eye syndrome of bilateral lacrimal glands: Secondary | ICD-10-CM | POA: Diagnosis not present

## 2018-12-17 DIAGNOSIS — H40033 Anatomical narrow angle, bilateral: Secondary | ICD-10-CM | POA: Diagnosis not present

## 2018-12-29 ENCOUNTER — Other Ambulatory Visit: Payer: Self-pay

## 2018-12-30 ENCOUNTER — Encounter: Payer: Self-pay | Admitting: Endocrinology

## 2018-12-30 ENCOUNTER — Ambulatory Visit (INDEPENDENT_AMBULATORY_CARE_PROVIDER_SITE_OTHER): Payer: BLUE CROSS/BLUE SHIELD | Admitting: Endocrinology

## 2018-12-30 VITALS — BP 124/86 | HR 83 | Temp 98.3°F | Ht 64.0 in | Wt 249.6 lb

## 2018-12-30 DIAGNOSIS — E559 Vitamin D deficiency, unspecified: Secondary | ICD-10-CM | POA: Diagnosis not present

## 2018-12-30 DIAGNOSIS — E890 Postprocedural hypothyroidism: Secondary | ICD-10-CM | POA: Diagnosis not present

## 2018-12-30 LAB — BASIC METABOLIC PANEL
BUN: 7 mg/dL (ref 6–23)
CO2: 28 mEq/L (ref 19–32)
Calcium: 9.8 mg/dL (ref 8.4–10.5)
Chloride: 101 mEq/L (ref 96–112)
Creatinine, Ser: 0.76 mg/dL (ref 0.40–1.20)
GFR: 83.12 mL/min (ref >60.00)
Glucose, Bld: 123 mg/dL — ABNORMAL HIGH (ref 70–99)
Potassium: 3.8 mEq/L (ref 3.5–5.1)
Sodium: 139 mEq/L (ref 135–145)

## 2018-12-30 LAB — MAGNESIUM: Magnesium: 1.8 mg/dL (ref 1.5–2.5)

## 2018-12-30 LAB — T3, FREE: T3, Free: 3 pg/mL (ref 2.3–4.2)

## 2018-12-30 LAB — VITAMIN D 25 HYDROXY (VIT D DEFICIENCY, FRACTURES): VITD: 42.5 ng/mL (ref 30.00–100.00)

## 2018-12-30 LAB — T4, FREE: Free T4: 0.78 ng/dL (ref 0.60–1.60)

## 2018-12-30 LAB — TSH: TSH: 4.68 u[IU]/mL — ABNORMAL HIGH (ref 0.35–4.50)

## 2018-12-30 NOTE — Progress Notes (Signed)
Subjective:    Patient ID: Heather Fry, female    DOB: 08-Aug-1976, 43 y.o.   MRN: [id number]  HPI Pt returns for f/u of postsurgical hypothyroidism (she had thyroidectomy 1/20, for hyperthyroidism; she was rx'ed synthroid soon thereafter).  She takes synthroid as rx'ed.   She also has mild postsurgical hypocalcemia (she takes Tums 2-BID; in 1/20, she was seen in ER with facial tingling sxs after it was d/c'ed).  She takes vit-D, 2x5000 units/day.  She takes Mg++ tabs, 250 mg/d.   She still has slight palpitations in the chest, and assoc tremor.  However, she says these sxs are improved since last ov.     Past Medical History:  Diagnosis Date  . Anxiety   . Arthritis   . Depression   . Diverticulosis   . GERD (gastroesophageal reflux disease)   . Graves disease   . Internal hemorrhoids   . Thyroid disease   . Uterine fibroid     Past Surgical History:  Procedure Laterality Date  . ABDOMINAL HYSTERECTOMY     Per patient, uterine fibroids.  . ABDOMINAL HYSTERECTOMY    . CHOLECYSTECTOMY N/A 11/06/2014   Procedure: LAPAROSCOPIC CHOLECYSTECTOMY;  Surgeon: Jamesetta So, MD;  Location: [facility];  Service: General;  Laterality: N/A;  . COLONOSCOPY N/A 03/04/2017   Procedure: COLONOSCOPY;  Surgeon: Daneil Dolin, MD;  [facility];  Service: Endoscopy;  Laterality: N/A;  8:30am  . DILATION AND CURETTAGE OF UTERUS    . ESOPHAGOGASTRODUODENOSCOPY N/A 10/02/2014   Procedure: ESOPHAGOGASTRODUODENOSCOPY (EGD);  Surgeon: Daneil Dolin, MD;  [facility];  Service: Endoscopy;  Laterality: N/A;  945am - moved to 8:45 - Ginger notified pt  . MOUTH SURGERY     extraction of teeth  . POLYPECTOMY  03/04/2017   Procedure: POLYPECTOMY;  Surgeon: Daneil Dolin, MD;  [facility];  Service: Endoscopy;;  colon  . THYROIDECTOMY N/A 09/30/2018   Procedure: TOTAL THYROIDECTOMY;  Surgeon: Armandina Gemma, MD;  Location: [facility];  Service: General;  Laterality: N/A;  . TUBAL  LIGATION      Social History   Socioeconomic History  . Marital status: Married    Spouse name: Not on file  . Number of children: Not on file  . Years of education: Not on file  . Highest education level: Not on file  Occupational History  . Not on file  Social Needs  . Financial resource strain: Not on file  . Food insecurity:    Worry: Not on file    Inability: Not on file  . Transportation needs:    Medical: Not on file    Non-medical: Not on file  Tobacco Use  . Smoking status: Former Smoker    Packs/day: 0.50    Years: 3.00    Pack years: 1.50    Types: Cigarettes    Last attempt to quit: 11/03/1997    Years since quitting: 21.1  . Smokeless tobacco: Never Used  . Tobacco comment: 1 pack 1 week  Substance and Sexual Activity  . Alcohol use: No    Alcohol/week: 0.0 standard drinks  . Drug use: No  . Sexual activity: Yes    Birth control/protection: Surgical  Lifestyle  . Physical activity:    Days per week: Not on file    Minutes per session: Not on file  . Stress: Not on file  Relationships  . Social connections:    Talks on phone: Not on file  Gets together: Not on file    Attends religious service: Not on file    Active member of club or organization: Not on file    Attends meetings of clubs or organizations: Not on file    Relationship status: Not on file  . Intimate partner violence:    Fear of current or ex partner: Not on file    Emotionally abused: Not on file    Physically abused: Not on file    Forced sexual activity: Not on file  Other Topics Concern  . Not on file  Social History Narrative  . Not on file    Current Outpatient Medications on File Prior to Visit  Medication Sig Dispense Refill  . acetaminophen (TYLENOL) 500 MG tablet Take 1,000 mg by mouth daily as needed for moderate pain.    . Camphor-Eucalyptus-Menthol (VICKS VAPORUB EX) Apply 1 application topically daily as needed (congestion/sinus headaches).    . Cholecalciferol  (VITAMIN D3) 125 MCG (5000 UT) CAPS Take 5,000 Units by mouth at bedtime.    Marland Kitchen escitalopram (LEXAPRO) 20 MG tablet Take 1 tablet (20 mg total) by mouth at bedtime. 30 tablet 5  . levothyroxine (SYNTHROID, LEVOTHROID) 112 MCG tablet Take 1 tablet (112 mcg total) by mouth daily before breakfast. 30 tablet 11  . losartan (COZAAR) 50 MG tablet Take 50 mg by mouth daily.    . Magnesium 250 MG TABS Take by mouth.    . Omega-3 Fatty Acids (FISH OIL) 1000 MG CAPS Take 1,000 mg by mouth at bedtime.    . simethicone (PHAZYME) 125 MG chewable tablet Chew 125-250 mg by mouth every 6 (six) hours as needed for flatulence.     . Wheat Dextrin (BENEFIBER DRINK MIX PO) Take 1 Dose by mouth 3 (three) times daily.      No current facility-administered medications on file prior to visit.     Allergies  Allergen Reactions  . Levofloxacin Other (See Comments)    Pt can not have due to taking Lexapro   . Penicillins Other (See Comments)    Loopy, childhood allergy DID THE REACTION INVOLVE: Swelling of the face/tongue/throat, SOB, or low BP? Unknown Sudden or severe rash/hives, skin peeling, or the inside of the mouth or nose? Unknown Did it require medical treatment? Yes When did it last happen?Childhood allergy If all above answers are "NO", may proceed with cephalosporin use.     Family History  Problem Relation Age of Onset  . Hypertension Father   . Diabetes Father   . Depression Other   . Uterine cancer Mother        ?cervical cancer  . Uterine cancer Sister        suicide  . Colon cancer Paternal Aunt 28  . Thyroid disease Neg Hx     BP 124/86   Pulse 83   Temp 98.3 F (36.8 C) (Oral)   Ht 5\' 4"  (1.626 m)   Wt 249 lb 9.6 oz (113.2 kg)   LMP 10/09/2014   SpO2 97%   BMI 42.84 kg/m    Review of Systems She has lost a few lbs, due to her efforts.  Facial numbness is much improved    Objective:   Physical Exam VITAL SIGNS:  See vs page GENERAL: no distress Neck: a healed  scar is present.  I do not appreciate a nodule in the thyroid or elsewhere in the neck   Lab Results  Component Value Date   PTH 11 (L) 12/30/2018  CALCIUM 9.8 12/30/2018   CALCIUM 9.9 12/30/2018   CAION 1.15 10/09/2018   PHOS 3.0 11/30/2018   Lab Results  Component Value Date   TSH 4.68 (H) 12/30/2018      Assessment & Plan:  Hypercalcemia: well-controlled.  Reduce Ca++ tabs to 1/day Hypothyroidism: slightly underreplaced Palpitations: in this setting, we'll hold off on increasing synthroid Facial paresthesias: uncertain etiology, improved.  We'll follow.  Please come back for a follow-up appointment in 4-6 weeks

## 2018-12-30 NOTE — Patient Instructions (Addendum)
Blood tests are requested for you today.  We'll let you know about the results.  Based on the results, I hope we can further reduce the calcium pill.  Please come back for a follow-up appointment in 4-6 weeks.

## 2018-12-31 LAB — PTH, INTACT AND CALCIUM
Calcium: 9.9 mg/dL (ref 8.6–10.2)
PTH: 11 pg/mL — ABNORMAL LOW (ref 14–64)

## 2019-01-25 ENCOUNTER — Other Ambulatory Visit: Payer: Self-pay | Admitting: Endocrinology

## 2019-01-26 ENCOUNTER — Other Ambulatory Visit: Payer: Self-pay

## 2019-01-26 ENCOUNTER — Other Ambulatory Visit: Payer: Self-pay | Admitting: Endocrinology

## 2019-01-26 ENCOUNTER — Telehealth: Payer: Self-pay | Admitting: Endocrinology

## 2019-01-26 MED ORDER — ESCITALOPRAM OXALATE 20 MG PO TABS: 20.0000 mg | ORAL_TABLET | Freq: Every day | ORAL | 5 refills | Status: DC

## 2019-01-26 NOTE — Telephone Encounter (Signed)
escitalopram (LEXAPRO) 20 MG tablet 30 tablet 5 01/26/2019    Sig - Route: Take 1 tablet (20 mg total) by mouth at bedtime. - Oral   Sent to pharmacy as: escitalopram (LEXAPRO) 20 MG tablet   E-Prescribing Status: Receipt confirmed by pharmacy (01/26/2019 11:35 AM EDT)

## 2019-01-26 NOTE — Telephone Encounter (Signed)
Yes, the refill request needs to be sent there.  Thank you.

## 2019-01-26 NOTE — Telephone Encounter (Signed)
MEDICATION:  escitalopram (LEXAPRO) 20 MG tablet  PHARMACY:  [facility]  IS THIS A 90 DAY SUPPLY :   IS PATIENT OUT OF MEDICATION: Yes  IF NOT; HOW MUCH IS LEFT:   LAST APPOINTMENT DATE: @4 /29/2020  NEXT APPOINTMENT DATE:@4 /30/2020  DO WE HAVE YOUR PERMISSION TO LEAVE A DETAILED MESSAGE:  OTHER COMMENTS:  Patient went without it last night and states she is very worried because she becomes very sick when she goes with out it for to long.  **Let patient know to contact pharmacy at the end of the day to make sure medication is ready. **  ** Please notify patient to allow 48-72 hours to process**  **Encourage patient to contact the pharmacy for refills or they can request refills through [facility]**

## 2019-01-26 NOTE — Telephone Encounter (Signed)
Please advise if refill is appropriate. I originally declined refill and advised to send to PCP.

## 2019-01-27 ENCOUNTER — Encounter: Payer: Self-pay | Admitting: Endocrinology

## 2019-01-27 ENCOUNTER — Ambulatory Visit (INDEPENDENT_AMBULATORY_CARE_PROVIDER_SITE_OTHER): Payer: BLUE CROSS/BLUE SHIELD | Admitting: Endocrinology

## 2019-01-27 ENCOUNTER — Other Ambulatory Visit: Payer: Self-pay

## 2019-01-27 VITALS — BP 152/100 | HR 92 | Temp 98.2°F | Wt 252.0 lb

## 2019-01-27 DIAGNOSIS — E890 Postprocedural hypothyroidism: Secondary | ICD-10-CM

## 2019-01-27 DIAGNOSIS — E559 Vitamin D deficiency, unspecified: Secondary | ICD-10-CM | POA: Diagnosis not present

## 2019-01-27 LAB — BASIC METABOLIC PANEL
BUN: 8 mg/dL (ref 6–23)
CO2: 26 mEq/L (ref 19–32)
Calcium: 8.8 mg/dL (ref 8.4–10.5)
Chloride: 102 mEq/L (ref 96–112)
Creatinine, Ser: 0.66 mg/dL (ref 0.40–1.20)
GFR: 97.78 mL/min (ref >60.00)
Glucose, Bld: 120 mg/dL — ABNORMAL HIGH (ref 70–99)
Potassium: 3.6 mEq/L (ref 3.5–5.1)
Sodium: 138 mEq/L (ref 135–145)

## 2019-01-27 LAB — T4, FREE: Free T4: 0.76 ng/dL (ref 0.60–1.60)

## 2019-01-27 LAB — VITAMIN D 25 HYDROXY (VIT D DEFICIENCY, FRACTURES): VITD: 60.05 ng/mL (ref 30.00–100.00)

## 2019-01-27 LAB — MAGNESIUM: Magnesium: 1.9 mg/dL (ref 1.5–2.5)

## 2019-01-27 LAB — TSH: TSH: 4.11 u[IU]/mL (ref 0.35–4.50)

## 2019-01-27 MED ORDER — LOSARTAN POTASSIUM 100 MG PO TABS: 100.0000 mg | ORAL_TABLET | Freq: Every day | ORAL | 3 refills | Status: DC

## 2019-01-27 NOTE — Patient Instructions (Addendum)
Your blood pressure is high today.  Please see your primary care provider soon, to have it rechecked.   Blood tests are requested for you today.  We'll let you know about the results.  Based on the results, I hope we can stop the calcium pill.   I have sent a prescription to your pharmacy, to increase the losartan.   Please come back for a follow-up appointment in 2 months.

## 2019-01-27 NOTE — Progress Notes (Signed)
Subjective:    Patient ID: Heather Fry, female    DOB: Sep 24, 1976, 43 y.o.   MRN: [id number]  HPI Pt returns for f/u of postsurgical hypothyroidism (she had thyroidectomy 1/20, for hyperthyroidism; she was rx'ed synthroid soon thereafter).  She takes synthroid as rx'ed.   She also has mild postsurgical hypocalcemia (she takes Tums 2-BID; in 1/20, she was seen in ER with facial tingling sxs after it was d/c'ed).  She takes vit-D, 2x5000 units/day.  She takes Mg++ tabs, 250 mg/d.   She still has slight myalgias throughout the body, but no assoc weakness.   Pt says Dr Gerarda Fraction is unavailable to f/u her HTN.  Past Medical History:  Diagnosis Date  . Anxiety   . Arthritis   . Depression   . Diverticulosis   . GERD (gastroesophageal reflux disease)   . Graves disease   . Internal hemorrhoids   . Thyroid disease   . Uterine fibroid     Past Surgical History:  Procedure Laterality Date  . ABDOMINAL HYSTERECTOMY     Per patient, uterine fibroids.  . ABDOMINAL HYSTERECTOMY    . CHOLECYSTECTOMY N/A 11/06/2014   Procedure: LAPAROSCOPIC CHOLECYSTECTOMY;  Surgeon: Jamesetta So, MD;  Location: [facility];  Service: General;  Laterality: N/A;  . COLONOSCOPY N/A 03/04/2017   Procedure: COLONOSCOPY;  Surgeon: Daneil Dolin, MD;  [facility];  Service: Endoscopy;  Laterality: N/A;  8:30am  . DILATION AND CURETTAGE OF UTERUS    . ESOPHAGOGASTRODUODENOSCOPY N/A 10/02/2014   Procedure: ESOPHAGOGASTRODUODENOSCOPY (EGD);  Surgeon: Daneil Dolin, MD;  [facility];  Service: Endoscopy;  Laterality: N/A;  945am - moved to 8:45 - Ginger notified pt  . MOUTH SURGERY     extraction of teeth  . POLYPECTOMY  03/04/2017   Procedure: POLYPECTOMY;  Surgeon: Daneil Dolin, MD;  [facility];  Service: Endoscopy;;  colon  . THYROIDECTOMY N/A 09/30/2018   Procedure: TOTAL THYROIDECTOMY;  Surgeon: Armandina Gemma, MD;  Location: [facility];  Service: General;  Laterality: N/A;  . TUBAL  LIGATION      Social History   Socioeconomic History  . Marital status: Married    Spouse name: Not on file  . Number of children: Not on file  . Years of education: Not on file  . Highest education level: Not on file  Occupational History  . Not on file  Social Needs  . Financial resource strain: Not on file  . Food insecurity:    Worry: Not on file    Inability: Not on file  . Transportation needs:    Medical: Not on file    Non-medical: Not on file  Tobacco Use  . Smoking status: Former Smoker    Packs/day: 0.50    Years: 3.00    Pack years: 1.50    Types: Cigarettes    Last attempt to quit: 11/03/1997    Years since quitting: 21.2  . Smokeless tobacco: Never Used  . Tobacco comment: 1 pack 1 week  Substance and Sexual Activity  . Alcohol use: No    Alcohol/week: 0.0 standard drinks  . Drug use: No  . Sexual activity: Yes    Birth control/protection: Surgical  Lifestyle  . Physical activity:    Days per week: Not on file    Minutes per session: Not on file  . Stress: Not on file  Relationships  . Social connections:    Talks on phone: Not on file  Gets together: Not on file    Attends religious service: Not on file    Active member of club or organization: Not on file    Attends meetings of clubs or organizations: Not on file    Relationship status: Not on file  . Intimate partner violence:    Fear of current or ex partner: Not on file    Emotionally abused: Not on file    Physically abused: Not on file    Forced sexual activity: Not on file  Other Topics Concern  . Not on file  Social History Narrative  . Not on file    Current Outpatient Medications on File Prior to Visit  Medication Sig Dispense Refill  . acetaminophen (TYLENOL) 500 MG tablet Take 1,000 mg by mouth daily as needed for moderate pain.    . Camphor-Eucalyptus-Menthol (VICKS VAPORUB EX) Apply 1 application topically daily as needed (congestion/sinus headaches).    . Cholecalciferol  (VITAMIN D3) 125 MCG (5000 UT) CAPS Take 5,000 Units by mouth at bedtime.    Marland Kitchen DM-APAP-CPM (CORICIDIN HBP PO) Take by mouth as directed.    . escitalopram (LEXAPRO) 20 MG tablet Take 1 tablet (20 mg total) by mouth at bedtime. 30 tablet 5  . levothyroxine (SYNTHROID, LEVOTHROID) 112 MCG tablet Take 1 tablet (112 mcg total) by mouth daily before breakfast. 30 tablet 11  . Magnesium 250 MG TABS Take by mouth.    . Omega-3 Fatty Acids (FISH OIL) 1000 MG CAPS Take 1,000 mg by mouth at bedtime.    . simethicone (PHAZYME) 125 MG chewable tablet Chew 125-250 mg by mouth every 6 (six) hours as needed for flatulence.     . Wheat Dextrin (BENEFIBER DRINK MIX PO) Take 1 Dose by mouth 3 (three) times daily.      No current facility-administered medications on file prior to visit.     Allergies  Allergen Reactions  . Levofloxacin Other (See Comments)    Pt can not have due to taking Lexapro   . Penicillins Other (See Comments)    Loopy, childhood allergy DID THE REACTION INVOLVE: Swelling of the face/tongue/throat, SOB, or low BP? Unknown Sudden or severe rash/hives, skin peeling, or the inside of the mouth or nose? Unknown Did it require medical treatment? Yes When did it last happen?Childhood allergy If all above answers are "NO", may proceed with cephalosporin use.     Family History  Problem Relation Age of Onset  . Hypertension Father   . Diabetes Father   . Depression Other   . Uterine cancer Mother        ?cervical cancer  . Uterine cancer Sister        suicide  . Colon cancer Paternal Aunt 77  . Thyroid disease Neg Hx     BP (!) 152/100 (BP Location: Left Arm, Patient Position: Sitting, Cuff Size: Large)   Pulse 92   Temp 98.2 F (36.8 C) (Oral)   Wt 252 lb (114.3 kg)   LMP 10/09/2014   SpO2 96%   BMI 43.26 kg/m   Review of Systems No change in chronic doe.  She has intermitt nausea.     Objective:   Physical Exam VITAL SIGNS:  See vs page GENERAL: no distress  LUNGS:  Clear to auscultation Ext: trace bilat leg edema.    Lab Results  Component Value Date   PTH 11 (L) 12/30/2018   CALCIUM 8.8 01/27/2019   CAION 1.15 10/09/2018   PHOS 3.0 11/30/2018  25-OH vit-D=normal  Lab Results  Component Value Date   TSH 4.11 01/27/2019      Assessment & Plan:  HTN: she needs increased rx.  I have sent a prescription to your pharmacy, to double the losartan Vit-D def: well-controlled.  Reduce vit-D Hypocalcemia: well-controlled.  D/c Ca++ tabs Hypothyroidism: well-replaced.  Please continue the same medication.

## 2019-01-28 LAB — PTH, INTACT AND CALCIUM
Calcium: 9.1 mg/dL (ref 8.6–10.2)
PTH: 23 pg/mL (ref 14–64)

## 2019-01-31 ENCOUNTER — Telehealth: Payer: Self-pay | Admitting: Endocrinology

## 2019-01-31 ENCOUNTER — Emergency Department (HOSPITAL_COMMUNITY)
Admission: EM | Admit: 2019-01-31 | Discharge: 2019-01-31 | Disposition: A | Discharge status: Home / Self Care | Payer: BLUE CROSS/BLUE SHIELD | Attending: Emergency Medicine | Admitting: Emergency Medicine

## 2019-01-31 ENCOUNTER — Other Ambulatory Visit: Payer: Self-pay

## 2019-01-31 ENCOUNTER — Ambulatory Visit (INDEPENDENT_AMBULATORY_CARE_PROVIDER_SITE_OTHER): Payer: BLUE CROSS/BLUE SHIELD | Admitting: Endocrinology

## 2019-01-31 ENCOUNTER — Encounter (HOSPITAL_COMMUNITY): Payer: Self-pay | Admitting: Emergency Medicine

## 2019-01-31 DIAGNOSIS — E039 Hypothyroidism, unspecified: Secondary | ICD-10-CM | POA: Diagnosis not present

## 2019-01-31 DIAGNOSIS — Z87891 Personal history of nicotine dependence: Secondary | ICD-10-CM | POA: Insufficient documentation

## 2019-01-31 DIAGNOSIS — F419 Anxiety disorder, unspecified: Secondary | ICD-10-CM | POA: Diagnosis not present

## 2019-01-31 DIAGNOSIS — Z79899 Other long term (current) drug therapy: Secondary | ICD-10-CM | POA: Insufficient documentation

## 2019-01-31 DIAGNOSIS — E890 Postprocedural hypothyroidism: Secondary | ICD-10-CM | POA: Diagnosis not present

## 2019-01-31 DIAGNOSIS — R9431 Abnormal electrocardiogram [ECG] [EKG]: Secondary | ICD-10-CM | POA: Diagnosis not present

## 2019-01-31 DIAGNOSIS — R5383 Other fatigue: Secondary | ICD-10-CM

## 2019-01-31 LAB — CBC WITH DIFFERENTIAL/PLATELET
Abs Immature Granulocytes: 0.03 10*3/uL (ref 0.00–0.07)
Basophils Absolute: 0 10*3/uL (ref 0.0–0.1)
Basophils Relative: 0 %
Eosinophils Absolute: 0.1 10*3/uL (ref 0.0–0.5)
Eosinophils Relative: 1 %
HCT: 41.4 % (ref 36.0–46.0)
Hemoglobin: 14 g/dL (ref 12.0–15.0)
Immature Granulocytes: 0 %
Lymphocytes Relative: 20 %
Lymphs Abs: 1.8 10*3/uL (ref 0.7–4.0)
MCH: 30.2 pg (ref 26.0–34.0)
MCHC: 33.8 g/dL (ref 30.0–36.0)
MCV: 89.4 fL (ref 80.0–100.0)
Monocytes Absolute: 0.3 10*3/uL (ref 0.1–1.0)
Monocytes Relative: 4 %
Neutro Abs: 6.5 10*3/uL (ref 1.7–7.7)
Neutrophils Relative %: 75 %
Platelets: 258 10*3/uL (ref 150–400)
RBC: 4.63 MIL/uL (ref 3.87–5.11)
RDW: 13.2 % (ref 11.5–15.5)
WBC: 8.8 10*3/uL (ref 4.0–10.5)
nRBC: 0 % (ref 0.0–0.2)

## 2019-01-31 LAB — URINALYSIS, ROUTINE W REFLEX MICROSCOPIC
Bilirubin Urine: NEGATIVE
Glucose, UA: NEGATIVE mg/dL
Hgb urine dipstick: NEGATIVE
Ketones, ur: NEGATIVE mg/dL
Leukocytes,Ua: NEGATIVE
Nitrite: NEGATIVE
Protein, ur: NEGATIVE mg/dL
Specific Gravity, Urine: 1.014 (ref 1.005–1.030)
pH: 7 (ref 5.0–8.0)

## 2019-01-31 LAB — BASIC METABOLIC PANEL
Anion gap: 13 (ref 5–15)
BUN: 8 mg/dL (ref 6–20)
CO2: 23 mmol/L (ref 22–32)
Calcium: 9.6 mg/dL (ref 8.9–10.3)
Chloride: 105 mmol/L (ref 98–111)
Creatinine, Ser: 0.59 mg/dL (ref 0.44–1.00)
GFR calc Af Amer: >60 mL/min (ref >60)
GFR calc non Af Amer: >60 mL/min (ref >60)
Glucose, Bld: 95 mg/dL (ref 70–99)
Potassium: 3.9 mmol/L (ref 3.5–5.1)
Sodium: 141 mmol/L (ref 135–145)

## 2019-01-31 MED ORDER — SYNTHROID 112 MCG PO TABS: 112.0000 ug | ORAL_TABLET | Freq: Every day | ORAL | 3 refills | Status: DC

## 2019-01-31 NOTE — Telephone Encounter (Signed)
Pt is in ER now.  Does she need me to help also?

## 2019-01-31 NOTE — Telephone Encounter (Signed)
Pt requested to schedule virtual visit to further discuss

## 2019-01-31 NOTE — Telephone Encounter (Signed)
Please advise 

## 2019-01-31 NOTE — Telephone Encounter (Signed)
As she is in ER now, how about tomorrow?

## 2019-01-31 NOTE — Patient Instructions (Signed)
I have sent a prescription to your pharmacy, for brand-name synthroid.   Please see Dr Gerarda Fraction for your symptoms.

## 2019-01-31 NOTE — Telephone Encounter (Signed)
OK 

## 2019-01-31 NOTE — ED Notes (Signed)
Pt from home

## 2019-01-31 NOTE — ED Triage Notes (Signed)
PT c/o increased anxiety, unable to sleep due, and increased heart rate. PT states hx of graves disease and new dose of levothyroxine prescribed by endocrinologist this past Thursday. PT states she called the office this this am and was told to come to ED.  PT states some chest tightness, generalized body aches and SOB at times. PT ambulatory from triage with no SOB noted and sats 98% of room air.

## 2019-01-31 NOTE — Telephone Encounter (Signed)
Per [facility], "Caller states she was in bed all day yesterday and couldn't go to sleep. She had SOB and would jump out of bed with anxiety. Could hardly move yesterday not she is just exhausted."  Placed in box.

## 2019-01-31 NOTE — Discharge Instructions (Addendum)
Your thyroid studies on 01/27/19 were normal. I do not think your symptoms are related to this. Your labs and urine today were normal and your EKG is not changed from prior ones. Some of your problems may be from an erratic sleep schedule. I want you to try and make good sleep hygiene a priority.

## 2019-01-31 NOTE — Telephone Encounter (Signed)
She insisted and requested appt today @ 3pm

## 2019-01-31 NOTE — Progress Notes (Signed)
Subjective:    Patient ID: Heather Fry, female    DOB: [birth year], 43 y.o.   MRN: [id number]  HPI  telehealth visit today via Phone x 12 minutes Alternatives to telehealth are presented to this patient, and the patient agrees to the telehealth visit. Pt is advised of the cost of the visit, and agrees to this, also.   Patient is at home, and I am at the office.   Persons attending the telehealth visit: the patient and I.  Pt returns for f/u of postsurgical hypothyroidism (she had thyroidectomy 1/20, for hyperthyroidism; she was rx'ed synthroid soon thereafter).  She takes synthroid as rx'ed.   She also has mild postsurgical hypocalcemia (she takes Tums 2-BID; in 1/20, she was seen in ER with facial tingling sxs after it was d/c'ed).  She takes vit-D, 2x5000 units/day.  She takes Mg++ tabs, 250 mg/d.  She reports palpitations in the chest, and assoc anxiety Past Medical History:  Diagnosis Date  . Anxiety   . Arthritis   . Depression   . Diverticulosis   . GERD (gastroesophageal reflux disease)   . Graves disease   . Internal hemorrhoids   . Thyroid disease   . Uterine fibroid     Past Surgical History:  Procedure Laterality Date  . ABDOMINAL HYSTERECTOMY     Per patient, uterine fibroids.  . ABDOMINAL HYSTERECTOMY    . CHOLECYSTECTOMY N/A 11/06/2014   Procedure: LAPAROSCOPIC CHOLECYSTECTOMY;  Surgeon: Jamesetta So, MD;  Location: [facility];  Service: General;  Laterality: N/A;  . COLONOSCOPY N/A 03/04/2017   Procedure: COLONOSCOPY;  Surgeon: Daneil Dolin, MD;  Location: [facility];  Service: Endoscopy;  Laterality: N/A;  8:30am  . DILATION AND CURETTAGE OF UTERUS    . ESOPHAGOGASTRODUODENOSCOPY N/A 10/02/2014   Procedure: ESOPHAGOGASTRODUODENOSCOPY (EGD);  Surgeon: Daneil Dolin, MD;  [facility]: [facility];  Service: Endoscopy;  Laterality: N/A;  945am - moved to 8:45 - Ginger notified pt  . MOUTH SURGERY     extraction of teeth  . POLYPECTOMY  03/04/2017   Procedure:  POLYPECTOMY;  Surgeon: Daneil Dolin, MD;  [facility]: [facility];  Service: Endoscopy;;  colon  . THYROIDECTOMY N/A 09/30/2018   Procedure: TOTAL THYROIDECTOMY;  Surgeon: Armandina Gemma, MD;  Location: [facility];  Service: General;  Laterality: N/A;  . TUBAL LIGATION      Social History   Socioeconomic History  . Marital status: Married    Spouse name: Not on file  . Number of children: Not on file  . Years of education: Not on file  . Highest education level: Not on file  Occupational History  . Not on file  Social Needs  . Financial resource strain: Not on file  . Food insecurity:    Worry: Not on file    Inability: Not on file  . Transportation needs:    Medical: Not on file    Non-medical: Not on file  Tobacco Use  . Smoking status: Former Smoker    Packs/day: 0.50    Years: 3.00    Pack years: 1.50    Types: Cigarettes    Last attempt to quit: 11/03/1997    Years since quitting: 21.2  . Smokeless tobacco: Never Used  . Tobacco comment: 1 pack 1 week  Substance and Sexual Activity  . Alcohol use: No    Alcohol/week: 0.0 standard drinks  . Drug use: No  . Sexual activity: Yes    Birth control/protection: Surgical  Lifestyle  . Physical activity:    Days per week: Not on file    Minutes per session: Not on file  . Stress: Not on file  Relationships  . Social connections:    Talks on phone: Not on file    Gets together: Not on file    Attends religious service: Not on file    Active member of club or organization: Not on file    Attends meetings of clubs or organizations: Not on file    Relationship status: Not on file  . Intimate partner violence:    Fear of current or ex partner: Not on file    Emotionally abused: Not on file    Physically abused: Not on file    Forced sexual activity: Not on file  Other Topics Concern  . Not on file  Social History Narrative  . Not on file    Current Outpatient Medications on File Prior to Visit  Medication Sig Dispense  Refill  . acetaminophen (TYLENOL) 500 MG tablet Take 1,000 mg by mouth daily as needed for moderate pain.    . Camphor-Eucalyptus-Menthol (VICKS VAPORUB EX) Apply 1 application topically daily as needed (congestion/sinus headaches).    . Cholecalciferol (VITAMIN D3) 125 MCG (5000 UT) CAPS Take 5,000 Units by mouth at bedtime.    Marland Kitchen DM-APAP-CPM (CORICIDIN HBP PO) Take by mouth as directed.    . escitalopram (LEXAPRO) 20 MG tablet Take 1 tablet (20 mg total) by mouth at bedtime. 30 tablet 5  . losartan (COZAAR) 100 MG tablet Take 1 tablet (100 mg total) by mouth daily. 90 tablet 3  . Magnesium 250 MG TABS Take by mouth.    . Omega-3 Fatty Acids (FISH OIL) 1000 MG CAPS Take 1,000 mg by mouth at bedtime.    . simethicone (PHAZYME) 125 MG chewable tablet Chew 125-250 mg by mouth every 6 (six) hours as needed for flatulence.     . Wheat Dextrin (BENEFIBER DRINK MIX PO) Take 1 Dose by mouth 3 (three) times daily.      No current facility-administered medications on file prior to visit.     Allergies  Allergen Reactions  . Levofloxacin Other (See Comments)    Pt can not have due to taking Lexapro   . Penicillins Other (See Comments)    Loopy, childhood allergy DID THE REACTION INVOLVE: Swelling of the face/tongue/throat, SOB, or low BP? Unknown Sudden or severe rash/hives, skin peeling, or the inside of the mouth or nose? Unknown Did it require medical treatment? Yes When did it last happen?Childhood allergy If all above answers are "NO", may proceed with cephalosporin use.     Family History  Problem Relation Age of Onset  . Hypertension Father   . Diabetes Father   . Depression Other   . Uterine cancer Mother        ?cervical cancer  . Uterine cancer Sister        suicide  . Colon cancer Paternal Aunt 71  . Thyroid disease Neg Hx     LMP 10/09/2014    Review of Systems She has fatigue and insomnia    Objective:   Physical Exam   Lab Results  Component Value Date    TSH 4.11 01/27/2019      Assessment & Plan:  Hypothyroidism: well-replaced.  We discussed.  Pt wants to change to brand-name synthroid.   Patient Instructions  I have sent a prescription to your pharmacy, for brand-name synthroid.   Please see  Dr Gerarda Fraction for your symptoms.

## 2019-01-31 NOTE — ED Notes (Signed)
ED Provider at bedside. 

## 2019-02-01 DIAGNOSIS — F329 Major depressive disorder, single episode, unspecified: Secondary | ICD-10-CM | POA: Diagnosis not present

## 2019-02-01 DIAGNOSIS — Z1389 Encounter for screening for other disorder: Secondary | ICD-10-CM | POA: Diagnosis not present

## 2019-02-01 DIAGNOSIS — F419 Anxiety disorder, unspecified: Secondary | ICD-10-CM | POA: Diagnosis not present

## 2019-02-01 DIAGNOSIS — Z6841 Body Mass Index (BMI) 40.0 and over, adult: Secondary | ICD-10-CM | POA: Diagnosis not present

## 2019-02-04 NOTE — ED Provider Notes (Signed)
[facility] EMERGENCY DEPARTMENT Provider Note   CSN: [id number] Arrival date & time: 01/31/19  0740    History   Chief Complaint Chief Complaint  Patient presents with  . Anxiety  . Generalized Body Aches    HPI Heather Fry is a 43 y.o. female.     HPI   42yF with numerous complaints. Anxious. Unable to sleep at times and then will crash for hours. Reports sleeping patterns have been vary erratic. Feels very anxious. Concerned it may be related to her thyroid. Sharp electrical pain running across her chest at times. Feels generally weak. Aches all over. No fever. No acute respiratory complaints.   Past Medical History:  Diagnosis Date  . Anxiety   . Arthritis   . Depression   . Diverticulosis   . GERD (gastroesophageal reflux disease)   . Graves disease   . Internal hemorrhoids   . Thyroid disease   . Uterine fibroid     Patient Active Problem List   Diagnosis Date Noted  . Tingling of face 11/30/2018  . Flushing 11/30/2018  . Vitamin D deficiency 10/06/2018  . GERD (gastroesophageal reflux disease) 01/28/2017  . Rectal bleeding 01/28/2017  . 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 Hypothyroidism 04/14/2016  . Gastric polyp   . RUQ abdominal pain 09/06/2014  . Excessive or frequent menstruation 01/13/2013  . Depression     Past Surgical History:  Procedure Laterality Date  . ABDOMINAL HYSTERECTOMY     Per patient, uterine fibroids.  . ABDOMINAL HYSTERECTOMY    . CHOLECYSTECTOMY N/A 11/06/2014   Procedure: LAPAROSCOPIC CHOLECYSTECTOMY;  Surgeon: Jamesetta So, MD;  Location: [facility];  Service: General;  Laterality: N/A;  . COLONOSCOPY N/A 03/04/2017   Procedure: COLONOSCOPY;  Surgeon: Daneil Dolin, MD;  [facility];  Service: Endoscopy;  Laterality: N/A;  8:30am  . DILATION AND CURETTAGE OF UTERUS    . ESOPHAGOGASTRODUODENOSCOPY N/A 10/02/2014   Procedure: ESOPHAGOGASTRODUODENOSCOPY (EGD);  Surgeon: Daneil Dolin, MD;  [facility];   Service: Endoscopy;  Laterality: N/A;  945am - moved to 8:45 - Ginger notified pt  . MOUTH SURGERY     extraction of teeth  . POLYPECTOMY  03/04/2017   Procedure: POLYPECTOMY;  Surgeon: Daneil Dolin, MD;  [facility];  Service: Endoscopy;;  colon  . THYROIDECTOMY N/A 09/30/2018   Procedure: TOTAL THYROIDECTOMY;  Surgeon: Armandina Gemma, MD;  Location: [facility];  Service: General;  Laterality: N/A;  . TUBAL LIGATION       OB History    Gravida  2   Para  2   Term  2   Preterm      AB      Living  2     SAB      TAB      Ectopic      Multiple      Live Births               Home Medications    Prior to Admission medications   Medication Sig Start Date End Date Taking? Authorizing Provider  acetaminophen (TYLENOL) 500 MG tablet Take 1,000 mg by mouth daily as needed for moderate pain.    [provider]  Camphor-Eucalyptus-Menthol (VICKS VAPORUB EX) Apply 1 application topically daily as needed (congestion/sinus headaches).    [provider]  Cholecalciferol (VITAMIN D3) 125 MCG (5000 UT) CAPS Take 5,000 Units by mouth at bedtime.    [provider]  DM-APAP-CPM (CORICIDIN HBP PO) Take by mouth as directed.    [provider]  escitalopram (LEXAPRO) 20 MG tablet Take 1 tablet (20 mg total) by mouth at bedtime. 01/26/19   Renato Shin, MD  losartan (COZAAR) 100 MG tablet Take 1 tablet (100 mg total) by mouth daily. 01/27/19   Renato Shin, MD  Magnesium 250 MG TABS Take by mouth.    [provider]  Omega-3 Fatty Acids (FISH OIL) 1000 MG CAPS Take 1,000 mg by mouth at bedtime.    [provider]  simethicone (PHAZYME) 125 MG chewable tablet Chew 125-250 mg by mouth every 6 (six) hours as needed for flatulence.     [provider]  SYNTHROID 112 MCG tablet Take 1 tablet (112 mcg total) by mouth daily before breakfast. 01/31/19   Renato Shin, MD  Wheat Dextrin (BENEFIBER DRINK MIX PO) Take 1 Dose by  mouth 3 (three) times daily.     [provider]    Family History Family History  Problem Relation Age of Onset  . Hypertension Father   . Diabetes Father   . Depression Other   . Uterine cancer Mother        ?cervical cancer  . Uterine cancer Sister        suicide  . Colon cancer Paternal Aunt 9  . Thyroid disease Neg Hx     Social History Social History   Tobacco Use  . Smoking status: Former Smoker    Packs/day: 0.50    Years: 3.00    Pack years: 1.50    Types: Cigarettes    Last attempt to quit: 11/03/1997    Years since quitting: 21.2  . Smokeless tobacco: Never Used  . Tobacco comment: 1 pack 1 week  Substance Use Topics  . Alcohol use: No    Alcohol/week: 0.0 standard drinks  . Drug use: No     Allergies   Levofloxacin and Penicillins   Review of Systems Review of Systems  All systems reviewed and negative, other than as noted in HPI.  Physical Exam Updated Vital Signs BP 126/66   Pulse 85   Temp 98.1 F (36.7 C)   Resp 16   Ht 5\' 4"  (1.626 m)   Wt 114.3 kg   LMP 10/09/2014   SpO2 95%   BMI 43.26 kg/m   Physical Exam Vitals signs and nursing note reviewed.  Constitutional:      General: She is not in acute distress.    Appearance: She is well-developed.  HENT:     Head: Normocephalic and atraumatic.  Eyes:     General:        Right eye: No discharge.        Left eye: No discharge.     Conjunctiva/sclera: Conjunctivae normal.  Neck:     Musculoskeletal: Neck supple.  Cardiovascular:     Rate and Rhythm: Normal rate and regular rhythm.     Heart sounds: Normal heart sounds. No murmur. No friction rub. No gallop.   Pulmonary:     Effort: Pulmonary effort is normal. No respiratory distress.     Breath sounds: Normal breath sounds.  Abdominal:     General: There is no distension.     Palpations: Abdomen is soft.     Tenderness: There is no abdominal tenderness.  Musculoskeletal:        General: No tenderness.  Skin:     General: Skin is warm and dry.  Neurological:  Mental Status: She is alert.  Psychiatric:        Behavior: Behavior normal.        Thought Content: Thought content normal.      ED Treatments / Results  Labs (all labs ordered are listed, but only abnormal results are displayed) Labs Reviewed  CBC WITH DIFFERENTIAL/PLATELET  BASIC METABOLIC PANEL  URINALYSIS, ROUTINE W REFLEX MICROSCOPIC    EKG EKG Interpretation  Date/Time:  Monday Jan 31 2019 08:06:56 EDT Ventricular Rate:  86 PR Interval:    QRS Duration: 83 QT Interval:  389 QTC Calculation: 466 R Axis:   54 Text Interpretation:  Sinus rhythm Low voltage, precordial leads Borderline T abnormalities, inferior leads No significant change since last tracing Confirmed by Virgel Manifold [phone]) on 01/31/2019 8:59:53 AM   Radiology No results found.  Procedures Procedures (including critical care time)  Medications Ordered in ED Medications - No data to display   Initial Impression / Assessment and Plan / ED Course  I have reviewed the triage vital signs and the nursing notes.  Pertinent labs & imaging results that were available during my care of the patient were reviewed by me and considered in my medical decision making (see chart for details).        42yF with numerous complaints. A lot of this seem like anxiety to me. Recent thyroid studies looked fine. I doubt contributory significantly and don't need repeating in this short off an interval. Basic labs ok. Afebrile. Nontoxic.Tried to reassure.    It has been determined that no acute conditions requiring further emergency intervention are present at this time. The patient has been advised of the diagnosis and plan. I reviewed any labs and imaging including any potential incidental findings. I have reviewed nursing notes and appropriate previous records. We have discussed signs and symptoms that warrant return to the ED and they are listed in the discharge  instructions.      Final Clinical Impressions(s) / ED Diagnoses   Final diagnoses:  Other fatigue    ED Discharge Orders    None       Virgel Manifold, MD 02/04/19 1645

## 2019-02-10 ENCOUNTER — Other Ambulatory Visit: Payer: Self-pay

## 2019-02-10 ENCOUNTER — Ambulatory Visit (INDEPENDENT_AMBULATORY_CARE_PROVIDER_SITE_OTHER): Payer: BLUE CROSS/BLUE SHIELD | Admitting: Nurse Practitioner

## 2019-02-10 ENCOUNTER — Encounter: Payer: Self-pay | Admitting: Nurse Practitioner

## 2019-02-10 DIAGNOSIS — R1012 Left upper quadrant pain: Secondary | ICD-10-CM | POA: Diagnosis not present

## 2019-02-10 DIAGNOSIS — R109 Unspecified abdominal pain: Secondary | ICD-10-CM | POA: Insufficient documentation

## 2019-02-10 DIAGNOSIS — R110 Nausea: Secondary | ICD-10-CM | POA: Diagnosis not present

## 2019-02-10 DIAGNOSIS — K219 Gastro-esophageal reflux disease without esophagitis: Secondary | ICD-10-CM | POA: Diagnosis not present

## 2019-02-10 DIAGNOSIS — R1010 Upper abdominal pain, unspecified: Secondary | ICD-10-CM | POA: Insufficient documentation

## 2019-02-10 NOTE — Assessment & Plan Note (Signed)
Intermittent nausea without vomiting does not seem to be very severe.  This all flares up with her other symptoms, as per below.  She has not improved in a couple weeks we can send in Zofran or other medications to help with her nausea.  All of her symptoms are likely interrelated and due to flared reflux/gastritis.  Follow-up in 2 months.

## 2019-02-10 NOTE — Progress Notes (Signed)
Referring Provider: Redmond School, MD Primary Care Physician:  Redmond School, MD Primary GI:  Dr. Gala Romney  NOTE: Service was provided via telemedicine and was requested by the patient due to COVID-19 pandemic.  Method of visit: Doxy.Me  Patient Location: Parked car  Provider Location: Office  Reason for Phone Visit: Follow-up  The patient was consented to phone follow-up via telephone encounter including billing of the encounter (yes/no): Yes  Persons present on the phone encounter, with roles: Husband  Total time (minutes) spent on medical discussion: 23 minutes  Chief Complaint  Patient presents with  . Abdominal Pain    upper abd, down left side x couple weeks, will have pain for few days before it goes away  . Diarrhea    had for about 1-2 days but then went away. BM's are moving regular now  . Nausea    no vomiting    HPI:   Heather Fry is a 43 y.o. female who presents for virtual visit regarding: Abdominal pain, nausea, diarrhea.  Patient was last seen in our office 01/28/2017 for GERD, rectal bleeding, proctalgia fugax.  EGD on file January/2016 with multiple 2 to 3 mm pedunculated polyps in the gastric body/fundus biopsy-proven fundic gland polyp.  Shortly after that had a cholecystectomy because of enlarging gallbladder polyps.  At her last visit she complained of worsening refractory reflux despite Protonix daily.  No dysphagia.  Bowel movement 1-2 times a day varies from [facility] 1 through 7 with mild left-sided/left upper quadrant pain relieved with gas passage.  Rectal spasms that are intermittent and unpredictable and not associated with bowel movement.  Intermittent rectal bleeding which she feels is related to hemorrhoids but never had a colonoscopy.  Recommended stop Protonix and start omeprazole 20 mg daily.  Trial Levsin, hold for constipation.  Drink adequate fluids.  Colonoscopy.  Colonoscopy was completed 03/04/2017 which found sigmoid diverticulosis,  internal hemorrhoids, a single 4 mm polyp.  Felt bleeding likely due to hemorrhoids and noted to be a reasonably good hemorrhoid banding candidate.  Surgical pathology found the polyp to be benign polyp.  Recommended 10-year repeat colonoscopy (2028).  Today she states she is doing ok overall. Has brief diarrhea that lasted 1-2 days but has since resolved; bowel movements more regular now. She is having upper abdominal pain/LUQ pain which will last a few days then go away for a while. Has associated bloating. Ginger tea helped somewhat, but not much. She is having dyspnea from the upper abdominal "rock hard area." Change to Synthroid helped most of her GI complaints. Has some intermittent GERD symptoms radiating up into her chest. Has been having worsening reflux. Her current symptoms "feel like a bad case of gastritis." She was previously off PPI, but when she went to the ER about 8-9 months and they restarted her Protonix. Has nausea but no vomiting. Nausea seems to flare when her reflux flares up. Denies hematochezia, melena, fever, chills, unintentional weight loss. Denies URI and flu-like symptoms or loss of sense of taste/smell. Denies chest pain, dyspnea, dizziness, lightheadedness, syncope, near syncope. Denies any other upper or lower GI symptoms.  Past Medical History:  Diagnosis Date  . Anxiety   . Arthritis   . Depression   . Diverticulosis   . GERD (gastroesophageal reflux disease)   . Graves disease   . Internal hemorrhoids   . Thyroid disease   . Uterine fibroid     Past Surgical History:  Procedure Laterality Date  . ABDOMINAL  HYSTERECTOMY     Per patient, uterine fibroids.  . ABDOMINAL HYSTERECTOMY    . CHOLECYSTECTOMY N/A 11/06/2014   Procedure: LAPAROSCOPIC CHOLECYSTECTOMY;  Surgeon: Jamesetta So, MD;  Location: [facility];  Service: General;  Laterality: N/A;  . COLONOSCOPY N/A 03/04/2017   Procedure: COLONOSCOPY;  Surgeon: Daneil Dolin, MD;  [facility];   Service: Endoscopy;  Laterality: N/A;  8:30am  . DILATION AND CURETTAGE OF UTERUS    . ESOPHAGOGASTRODUODENOSCOPY N/A 10/02/2014   Procedure: ESOPHAGOGASTRODUODENOSCOPY (EGD);  Surgeon: Daneil Dolin, MD;  [facility];  Service: Endoscopy;  Laterality: N/A;  945am - moved to 8:45 - Ginger notified pt  . MOUTH SURGERY     extraction of teeth  . POLYPECTOMY  03/04/2017   Procedure: POLYPECTOMY;  Surgeon: Daneil Dolin, MD;  [facility]: [facility];  Service: Endoscopy;;  colon  . THYROIDECTOMY N/A 09/30/2018   Procedure: TOTAL THYROIDECTOMY;  Surgeon: Armandina Gemma, MD;  Location: [facility];  Service: General;  Laterality: N/A;  . TUBAL LIGATION      Current Outpatient Medications  Medication Sig Dispense Refill  . acetaminophen (TYLENOL) 500 MG tablet Take 1,000 mg by mouth daily as needed for moderate pain.    . Camphor-Eucalyptus-Menthol (VICKS VAPORUB EX) Apply 1 application topically daily as needed (congestion/sinus headaches).    . Cholecalciferol (VITAMIN D3) 125 MCG (5000 UT) CAPS Take 5,000 Units by mouth at bedtime.    Marland Kitchen DM-APAP-CPM (CORICIDIN HBP PO) Take by mouth as needed.     Marland Kitchen escitalopram (LEXAPRO) 20 MG tablet Take 1 tablet (20 mg total) by mouth at bedtime. 30 tablet 5  . losartan (COZAAR) 100 MG tablet Take 1 tablet (100 mg total) by mouth daily. (Patient taking differently: Take 50 mg by mouth daily. ) 90 tablet 3  . Magnesium 250 MG TABS Take by mouth.    . Omega-3 Fatty Acids (FISH OIL) 1000 MG CAPS Take 1,000 mg by mouth at bedtime.    . pantoprazole (PROTONIX) 40 MG tablet Take 40 mg by mouth daily.    . simethicone (PHAZYME) 125 MG chewable tablet Chew 125-250 mg by mouth every 6 (six) hours as needed for flatulence.     Marland Kitchen SYNTHROID 112 MCG tablet Take 1 tablet (112 mcg total) by mouth daily before breakfast. 90 tablet 3  . Wheat Dextrin (BENEFIBER DRINK MIX PO) Take 1 Dose by mouth 3 (three) times daily.      No current facility-administered medications for  this visit.     Allergies as of 02/10/2019 - Review Complete 02/10/2019  Allergen Reaction Noted  . Levofloxacin Other (See Comments) 03/03/2017  . Penicillins Other (See Comments) 04/06/2012    Family History  Problem Relation Age of Onset  . Hypertension Father   . Diabetes Father   . Depression Other   . Uterine cancer Mother        ?cervical cancer  . Uterine cancer Sister        suicide  . Colon cancer Paternal Aunt 32  . Thyroid disease Neg Hx     Social History   Socioeconomic History  . Marital status: Married    Spouse name: Not on file  . Number of children: Not on file  . Years of education: Not on file  . Highest education level: Not on file  Occupational History  . Not on file  Social Needs  . Financial resource strain: Not on file  . Food insecurity:  Worry: Not on file    Inability: Not on file  . Transportation needs:    Medical: Not on file    Non-medical: Not on file  Tobacco Use  . Smoking status: Former Smoker    Packs/day: 0.50    Years: 3.00    Pack years: 1.50    Types: Cigarettes    Last attempt to quit: 11/03/1997    Years since quitting: 21.2  . Smokeless tobacco: Never Used  . Tobacco comment: 1 pack 1 week  Substance and Sexual Activity  . Alcohol use: No    Alcohol/week: 0.0 standard drinks  . Drug use: No  . Sexual activity: Yes    Birth control/protection: Surgical  Lifestyle  . Physical activity:    Days per week: Not on file    Minutes per session: Not on file  . Stress: Not on file  Relationships  . Social connections:    Talks on phone: Not on file    Gets together: Not on file    Attends religious service: Not on file    Active member of club or organization: Not on file    Attends meetings of clubs or organizations: Not on file    Relationship status: Not on file  Other Topics Concern  . Not on file  Social History Narrative  . Not on file    Review of Systems: General: Negative for anorexia, weight loss,  fever, chills, fatigue, weakness. ENT: Negative for hoarseness, difficulty swallowing. CV: Negative for chest pain, angina, palpitations, peripheral edema.  Respiratory: Negative for dyspnea at rest, cough, sputum, wheezing.  GI: See history of present illness. Endo: Negative for unusual weight change.  Heme: Negative for bruising or bleeding. Allergy: Negative for rash or hives.  Physical Exam: Note: limited exam due to virtual visit General:   Alert and oriented. Pleasant and cooperative. Well-nourished and well-developed.  Head:  Normocephalic and atraumatic. Eyes:  Without icterus, sclera clear and conjunctiva pink.  Ears:  Normal auditory acuity. Skin:  Intact without facial significant lesions or rashes. Neurologic:  Alert and oriented x4;  grossly normal neurologically. Psych:  Alert and cooperative. Normal mood and affect. Heme/Lymph/Immune: No excessive bruising noted.

## 2019-02-10 NOTE — Patient Instructions (Signed)
Your health issues we discussed today were:   GERD (reflux/heartburn), abdominal pain, nausea, bloating: 1. I feel all of your symptoms are likely related and due to worsening reflux 2. As we discussed, increase her Protonix to 40 mg twice a day 3. Continue to avoid foods that worsen your symptoms 4. Call us in 2 to 4 weeks and let us know if your increase Protonix is helping 5. If you do not improve we may need to consider other testing or changing to a different acid blocker 6. You can continue to use simethicone for bloating 7. Call us if you have any severe worsening symptoms  Overall I recommend:  1. Continue your other medications otherwise 2. Return for follow-up in 2 months 3. Call us for any questions or concerns   Because of recent events of COVID-19 ("Coronavirus"), follow CDC recommendations:  1. Wash your hand frequently 2. Avoid touching your face 3. Stay away from people who are sick 4. If you have symptoms such as fever, cough, shortness of breath then call your healthcare provider for further guidance 5. If you are sick, STAY AT HOME unless otherwise directed by your healthcare provider. 6. Follow directions from state and national officials regarding staying safe   At [facility] we value your feedback. You may receive a survey about your visit today. Please share your experience as we strive to create trusting relationships with our patients to provide genuine, compassionate, quality care.  We appreciate your understanding and patience as we review any laboratory studies, imaging, and other diagnostic tests that are ordered as we care for you. Our office policy is 5 business days for review of these results, and any emergent or urgent results are addressed in a timely manner for your best interest. If you do not hear from our office in 1 week, please contact us.   We also encourage the use of MyChart, which contains your medical information for your  review as well. If you are not enrolled in this feature, an access code is on this after visit summary for your convenience. Thank you for allowing [place] to be involved in your care.  It was great to see you today!  I hope you have a great day!!

## 2019-02-10 NOTE — Assessment & Plan Note (Signed)
Chronic GERD and currently complaining of symptoms that seem quite classic for her typical GERD symptoms.  She even states resolve "this feels like this bad gastritis".  She was previously on Protonix 40 mg once a day and then omeprazole 20 mg once a day, however this was stopped.  She was in the emergency room several months ago and they restarted her on Protonix due to what they felt to be a bad case of gastritis.  We will have her increase this to twice a day, trigger avoidance.  Call for any persistent symptoms.  Call in 2 to 4 weeks and let us know if increasing PPI dosing is helped.  May need to consider other options such as changing PPI, H. pylori breath testing.  Follow-up in 2 months in our office.

## 2019-02-10 NOTE — Assessment & Plan Note (Signed)
The patient describe left upper quadrant abdominal pain.  This typically occurs with symptoms that are equivalent to "like bad gastritis" with a known history of chronic GERD.  She also has associated nausea that flares at the same time as her other symptoms.  She was off her PPI for some time but was restarted by the emergency room in the past several months.  She is on Protonix 40 mg once a day.  Review her abdominal pain is likely upper GI/GERD/possible gastritis in nature.  At this point I will increase her Protonix to twice daily.  Recommend trigger avoidance, which she is very well versed in.  I will have her call us in 2 to 4 weeks and let us know if the increase Protonix is helping.  If not we may need to consider H. pylori breath testing versus changing to another PPI.  If persistent symptoms despite a couple different options then we can consider upper endoscopy for further evaluation.  Follow-up in our office in 2 months.

## 2019-02-14 ENCOUNTER — Encounter: Payer: Self-pay | Admitting: Nurse Practitioner

## 2019-02-14 NOTE — Progress Notes (Signed)
ON RECALL  °

## 2019-02-24 DIAGNOSIS — Z79899 Other long term (current) drug therapy: Secondary | ICD-10-CM | POA: Diagnosis not present

## 2019-02-24 DIAGNOSIS — F419 Anxiety disorder, unspecified: Secondary | ICD-10-CM | POA: Diagnosis not present

## 2019-02-24 DIAGNOSIS — K219 Gastro-esophageal reflux disease without esophagitis: Secondary | ICD-10-CM | POA: Diagnosis not present

## 2019-02-24 DIAGNOSIS — E0590 Thyrotoxicosis, unspecified without thyrotoxic crisis or storm: Secondary | ICD-10-CM | POA: Diagnosis not present

## 2019-02-24 DIAGNOSIS — Z87891 Personal history of nicotine dependence: Secondary | ICD-10-CM | POA: Diagnosis not present

## 2019-02-24 DIAGNOSIS — R531 Weakness: Secondary | ICD-10-CM | POA: Diagnosis not present

## 2019-02-24 DIAGNOSIS — R1013 Epigastric pain: Secondary | ICD-10-CM | POA: Diagnosis not present

## 2019-02-24 DIAGNOSIS — R109 Unspecified abdominal pain: Secondary | ICD-10-CM | POA: Diagnosis not present

## 2019-02-25 ENCOUNTER — Encounter: Payer: Self-pay | Admitting: Endocrinology

## 2019-02-25 ENCOUNTER — Other Ambulatory Visit: Payer: Self-pay

## 2019-02-25 ENCOUNTER — Ambulatory Visit (INDEPENDENT_AMBULATORY_CARE_PROVIDER_SITE_OTHER): Payer: BLUE CROSS/BLUE SHIELD | Admitting: Endocrinology

## 2019-02-25 VITALS — BP 132/90 | HR 99 | Temp 98.2°F | Wt 249.0 lb

## 2019-02-25 DIAGNOSIS — E890 Postprocedural hypothyroidism: Secondary | ICD-10-CM | POA: Diagnosis not present

## 2019-02-25 DIAGNOSIS — R531 Weakness: Secondary | ICD-10-CM | POA: Diagnosis not present

## 2019-02-25 DIAGNOSIS — R109 Unspecified abdominal pain: Secondary | ICD-10-CM | POA: Diagnosis not present

## 2019-02-25 MED ORDER — LEVOTHYROXINE SODIUM 125 MCG PO TABS: 125.0000 ug | ORAL_TABLET | Freq: Every day | ORAL | 3 refills | Status: DC

## 2019-02-25 MED ORDER — SYNTHROID 125 MCG PO TABS: 125.0000 ug | ORAL_TABLET | Freq: Every day | ORAL | 3 refills | Status: DC

## 2019-02-25 NOTE — Progress Notes (Signed)
Subjective:    Patient ID: Heather Fry, female    DOB: 11/18/1975, 43 y.o.   MRN: [id number]  HPI Pt returns for f/u of postsurgical hypothyroidism (she had thyroidectomy 1/20, for hyperthyroidism; she was rx'ed synthroid soon thereafter).  She takes synthroid as rx'ed.   She also has mild postsurgical hypocalcemia (she took Tums 2-BID; in 1/20, she was seen in ER with facial tingling sxs after it was d/c'ed; she successfully stopped it 4/20).  She takes vit-D, 5000 units/day.  She takes Mg++ tabs, 250 mg/d.   She was seen at [facility] ER last Fry, with weakness throughout the body, and assoc flushing.   Past Medical History:  Diagnosis Date  . Anxiety   . Arthritis   . Depression   . Diverticulosis   . GERD (gastroesophageal reflux disease)   . Graves disease   . Internal hemorrhoids   . Thyroid disease   . Uterine fibroid     Past Surgical History:  Procedure Laterality Date  . ABDOMINAL HYSTERECTOMY     Per patient, uterine fibroids.  . ABDOMINAL HYSTERECTOMY    . CHOLECYSTECTOMY N/A 11/06/2014   Procedure: LAPAROSCOPIC CHOLECYSTECTOMY;  Surgeon: Jamesetta So, MD;  Location: [facility];  Service: General;  Laterality: N/A;  . COLONOSCOPY N/A 03/04/2017   Procedure: COLONOSCOPY;  Surgeon: Daneil Dolin, MD;  Location: [facility];  Service: Endoscopy;  Laterality: N/A;  8:30am  . DILATION AND CURETTAGE OF UTERUS    . ESOPHAGOGASTRODUODENOSCOPY N/A 10/02/2014   Procedure: ESOPHAGOGASTRODUODENOSCOPY (EGD);  Surgeon: Daneil Dolin, MD;  Location: [facility];  Service: Endoscopy;  Laterality: N/A;  945am - moved to 8:45 - Ginger notified pt  . MOUTH SURGERY     extraction of teeth  . POLYPECTOMY  03/04/2017   Procedure: POLYPECTOMY;  Surgeon: Daneil Dolin, MD;  Location: [facility];  Service: Endoscopy;;  colon  . THYROIDECTOMY N/A 09/30/2018   Procedure: TOTAL THYROIDECTOMY;  Surgeon: Armandina Gemma, MD;  Location: [facility];  Service: General;  Laterality: N/A;  . TUBAL  LIGATION      Social History   Socioeconomic History  . Marital status: Married    Spouse name: Not on file  . Number of children: Not on file  . Years of education: Not on file  . Highest education level: Not on file  Occupational History  . Not on file  Social Needs  . Financial resource strain: Not on file  . Food insecurity:    Worry: Not on file    Inability: Not on file  . Transportation needs:    Medical: Not on file    Non-medical: Not on file  Tobacco Use  . Smoking status: Former Smoker    Packs/day: 0.50    Years: 3.00    Pack years: 1.50    Types: Cigarettes    Last attempt to quit: 11/03/1997    Years since quitting: 21.3  . Smokeless tobacco: Never Used  . Tobacco comment: 1 pack 1 week  Substance and Sexual Activity  . Alcohol use: No    Alcohol/week: 0.0 standard drinks  . Drug use: No  . Sexual activity: Yes    Birth control/protection: Surgical  Lifestyle  . Physical activity:    Days per week: Not on file    Minutes per session: Not on file  . Stress: Not on file  Relationships  . Social connections:    Talks on phone: Not on file  Gets together: Not on file    Attends religious service: Not on file    Active member of club or organization: Not on file    Attends meetings of clubs or organizations: Not on file    Relationship status: Not on file  . Intimate partner violence:    Fear of current or ex partner: Not on file    Emotionally abused: Not on file    Physically abused: Not on file    Forced sexual activity: Not on file  Other Topics Concern  . Not on file  Social History Narrative  . Not on file    Current Outpatient Medications on File Prior to Visit  Medication Sig Dispense Refill  . acetaminophen (TYLENOL) 500 MG tablet Take 1,000 mg by mouth daily as needed for moderate pain.    . Camphor-Eucalyptus-Menthol (VICKS VAPORUB EX) Apply 1 application topically daily as needed (congestion/sinus headaches).    . Cholecalciferol  (VITAMIN D3) 125 MCG (5000 UT) CAPS Take 5,000 Units by mouth at bedtime.    Marland Kitchen DM-APAP-CPM (CORICIDIN HBP PO) Take by mouth as needed.     Marland Kitchen escitalopram (LEXAPRO) 20 MG tablet Take 1 tablet (20 mg total) by mouth at bedtime. 30 tablet 5  . Magnesium 250 MG TABS Take by mouth.    . Omega-3 Fatty Acids (FISH OIL) 1000 MG CAPS Take 1,000 mg by mouth at bedtime.    . pantoprazole (PROTONIX) 40 MG tablet Take 40 mg by mouth daily.    . simethicone (PHAZYME) 125 MG chewable tablet Chew 125-250 mg by mouth every 6 (six) hours as needed for flatulence.     . Wheat Dextrin (BENEFIBER DRINK MIX PO) Take 1 Dose by mouth 3 (three) times daily.      No current facility-administered medications on file prior to visit.     Allergies  Allergen Reactions  . Levofloxacin Other (See Comments)    Pt can not have due to taking Lexapro   . Penicillins Other (See Comments)    Loopy, childhood allergy DID THE REACTION INVOLVE: Swelling of the face/tongue/throat, SOB, or low BP? Unknown Sudden or severe rash/hives, skin peeling, or the inside of the mouth or nose? Unknown Did it require medical treatment? Yes When did it last happen?Childhood allergy If all above answers are "NO", may proceed with cephalosporin use.     Family History  Problem Relation Age of Onset  . Hypertension Father   . Diabetes Father   . Depression Other   . Uterine cancer Mother        ?cervical cancer  . Uterine cancer Sister        suicide  . Colon cancer Paternal Aunt 31  . Thyroid disease Neg Hx     BP 132/90 (BP Location: Right Arm, Patient Position: Sitting, Cuff Size: Large)   Pulse 99   Temp 98.2 F (36.8 C) (Oral)   Wt 249 lb (112.9 kg)   LMP 10/09/2014   SpO2 99%   BMI 42.74 kg/m    Review of Systems She has intermit headache, anxiety, and nausea.      Objective:   Physical Exam VITAL SIGNS:  See vs page GENERAL: no distress Neck: a healed scar is present.  I do not appreciate a nodule in the  thyroid or elsewhere in the neck.     outside test results are reviewed: TSH=6 BMET and Mg++=normal    Assessment & Plan:  Hypothyroidism: she needs increased rx. HTN: is again noted  today. Generalized weakness: not related to the above.     Patient Instructions  Your blood pressure is high today.  Please see your primary care provider soon, to have it rechecked I have sent a prescription to your pharmacy, to slightly increase the thyroid pill.  Please come back for a follow-up appointment in 1 month, as scheduled.

## 2019-02-25 NOTE — Patient Instructions (Addendum)
Your blood pressure is high today.  Please see your primary care provider soon, to have it rechecked I have sent a prescription to your pharmacy, to slightly increase the thyroid pill.  Please come back for a follow-up appointment in 1 month, as scheduled.

## 2019-03-01 ENCOUNTER — Ambulatory Visit (INDEPENDENT_AMBULATORY_CARE_PROVIDER_SITE_OTHER): Payer: BC Managed Care – PPO | Admitting: Nurse Practitioner

## 2019-03-01 ENCOUNTER — Encounter: Payer: Self-pay | Admitting: Nurse Practitioner

## 2019-03-01 ENCOUNTER — Other Ambulatory Visit: Payer: Self-pay

## 2019-03-01 VITALS — BP 149/95 | HR 86 | Temp 98.0°F | Ht 64.0 in | Wt 254.0 lb

## 2019-03-01 DIAGNOSIS — K219 Gastro-esophageal reflux disease without esophagitis: Secondary | ICD-10-CM

## 2019-03-01 DIAGNOSIS — R1013 Epigastric pain: Secondary | ICD-10-CM | POA: Diagnosis not present

## 2019-03-01 NOTE — Progress Notes (Signed)
Referring Provider: Redmond School, MD Primary Care Physician:  Redmond School, MD Primary GI:  Dr. Gala Romney  Chief Complaint  Patient presents with  . Abdominal Pain    Nausea,? ulcer per [facility]    HPI:   Heather Fry is a 43 y.o. female who presents for follow-up on GERD, left upper quadrant pain, nausea.  Patient was last seen in our office 02/10/2019 for the same.  EGD on file January 2016 with multiple 2 to 3 mm pedunculated polyps in the gastric body/fundus and biopsy-proven fundic gland polyp.  Status post cholecystectomy shortly after that.  Colonoscopy up-to-date 2018 with a single polyp found to be benign and recommended 10-year repeat in 2028  At her last visit she was doing okay overall.  Upper abdominal pain/left upper quadrant pain was last few days and goes away for a while and is associated with bloating.  Ginger tea has helped but not much.  Dyspnea from the upper abdominal "rockhard area".  Change to Synthroid has helped most of her GI complaints.  Intermittent GERD and worsening reflux.  She was restarted on Protonix 8 to 9 months ago.  Nausea seems to flare when her reflux flares.  No other GI complaints.  Recommended increase Protonix to 40 mg twice a day, avoid triggers, call in 2 to 4 weeks with a progress to work.  Consider changing PPI or other work-up depending on response.  Simethicone for bloating.  Follow-up in 2 months.  Today she states she's doing ok . Her Synthroid levels were off ("I was extremely hypo") and Synthroid dose was adjusted a few days ago, has had a lot of fatigue and is hoping this will help. She also typically has a lot of GI issues when her dosing is off. Has hypertension today, but states she did a lot of walking on the way in today. GERD is doing somewhat better on Carafate. Still with epigastric pain and "spasm like" feeling. Was doing well until her thyroid levels were off. Was told by [facility] that she may have an ulcer. Hasn't been  taking it as often as prescribed. Denies NSAIDs. Synthroid levels will be checked at the end of this month. Her TSH was elevated in the ER (per the patient). Denies hematochezia, melena, fever, chills, unintentional weight loss. Denies URI or flu-like symptoms. Denies loss of sense of taste or smell. Denies chest pain, dyspnea, dizziness, lightheadedness, syncope, near syncope. Denies any other upper or lower GI symptoms.  Past Medical History:  Diagnosis Date  . Anxiety   . Arthritis   . Depression   . Diverticulosis   . GERD (gastroesophageal reflux disease)   . Graves disease   . Internal hemorrhoids   . Thyroid disease   . Uterine fibroid     Past Surgical History:  Procedure Laterality Date  . ABDOMINAL HYSTERECTOMY     Per patient, uterine fibroids.  . ABDOMINAL HYSTERECTOMY    . CHOLECYSTECTOMY N/A 11/06/2014   Procedure: LAPAROSCOPIC CHOLECYSTECTOMY;  Surgeon: Jamesetta So, MD;  Location: [facility];  Service: General;  Laterality: N/A;  . COLONOSCOPY N/A 03/04/2017   Procedure: COLONOSCOPY;  Surgeon: Daneil Dolin, MD;  [facility];  Service: Endoscopy;  Laterality: N/A;  8:30am  . DILATION AND CURETTAGE OF UTERUS    . ESOPHAGOGASTRODUODENOSCOPY N/A 10/02/2014   Procedure: ESOPHAGOGASTRODUODENOSCOPY (EGD);  Surgeon: Daneil Dolin, MD;  [facility];  Service: Endoscopy;  Laterality: N/A;  945am - moved  to 8:45 - Ginger notified pt  . MOUTH SURGERY     extraction of teeth  . POLYPECTOMY  03/04/2017   Procedure: POLYPECTOMY;  Surgeon: Daneil Dolin, MD;  [facility]: [facility];  Service: Endoscopy;;  colon  . THYROIDECTOMY N/A 09/30/2018   Procedure: TOTAL THYROIDECTOMY;  Surgeon: Armandina Gemma, MD;  Location: [facility];  Service: General;  Laterality: N/A;  . TUBAL LIGATION      Current Outpatient Medications  Medication Sig Dispense Refill  . acetaminophen (TYLENOL) 500 MG tablet Take 1,000 mg by mouth daily as needed for moderate pain.    .  Camphor-Eucalyptus-Menthol (VICKS VAPORUB EX) Apply 1 application topically daily as needed (congestion/sinus headaches).    . Cholecalciferol (VITAMIN D3) 125 MCG (5000 UT) CAPS Take 5,000 Units by mouth at bedtime.    Marland Kitchen DM-APAP-CPM (CORICIDIN HBP PO) Take by mouth as needed.     Marland Kitchen escitalopram (LEXAPRO) 20 MG tablet Take 1 tablet (20 mg total) by mouth at bedtime. 30 tablet 5  . Magnesium 250 MG TABS Take by mouth.    . Omega-3 Fatty Acids (FISH OIL) 1000 MG CAPS Take 1,000 mg by mouth at bedtime.    . pantoprazole (PROTONIX) 40 MG tablet Take 40 mg by mouth 2 (two) times a day.     . simethicone (PHAZYME) 125 MG chewable tablet Chew 125-250 mg by mouth every 6 (six) hours as needed for flatulence.     . sucralfate (CARAFATE) 1 g tablet Take 1 g by mouth 4 (four) times daily -  with meals and at bedtime.    Marland Kitchen SYNTHROID 125 MCG tablet Take 1 tablet (125 mcg total) by mouth daily before breakfast. 90 tablet 3  . Wheat Dextrin (BENEFIBER DRINK MIX PO) Take 1 Dose by mouth 3 (three) times daily.      No current facility-administered medications for this visit.     Allergies as of 03/01/2019 - Review Complete 03/01/2019  Allergen Reaction Noted  . Levofloxacin Other (See Comments) 03/03/2017  . Penicillins Other (See Comments) 04/06/2012    Family History  Problem Relation Age of Onset  . Hypertension Father   . Diabetes Father   . Depression Other   . Uterine cancer Mother        ?cervical cancer  . Uterine cancer Sister        suicide  . Colon cancer Paternal Aunt 37  . Thyroid disease Neg Hx     Social History   Socioeconomic History  . Marital status: Married    Spouse name: Not on file  . Number of children: Not on file  . Years of education: Not on file  . Highest education level: Not on file  Occupational History  . Not on file  Social Needs  . Financial resource strain: Not on file  . Food insecurity:    Worry: Not on file    Inability: Not on file  .  Transportation needs:    Medical: Not on file    Non-medical: Not on file  Tobacco Use  . Smoking status: Former Smoker    Packs/day: 0.50    Years: 3.00    Pack years: 1.50    Types: Cigarettes    Last attempt to quit: 11/03/1997    Years since quitting: 21.3  . Smokeless tobacco: Never Used  . Tobacco comment: 1 pack 1 week  Substance and Sexual Activity  . Alcohol use: No    Alcohol/week: 0.0 standard drinks  .  Drug use: No  . Sexual activity: Yes    Birth control/protection: Surgical  Lifestyle  . Physical activity:    Days per week: Not on file    Minutes per session: Not on file  . Stress: Not on file  Relationships  . Social connections:    Talks on phone: Not on file    Gets together: Not on file    Attends religious service: Not on file    Active member of club or organization: Not on file    Attends meetings of clubs or organizations: Not on file    Relationship status: Not on file  Other Topics Concern  . Not on file  Social History Narrative  . Not on file    Review of Systems: General: Negative for anorexia, weight loss, fever, chills, weakness. Admits recent fatigue with "off" thyroid levels ENT: Negative for hoarseness, difficulty swallowing CV: Negative for chest pain, angina, palpitations, peripheral edema.  Respiratory: Negative for dyspnea at rest, cough, sputum, wheezing.  GI: See history of present illness. Endo: Negative for unusual weight change.  Heme: Negative for bruising or bleeding. Allergy: Negative for rash or hives.   Physical Exam: BP (!) 155/98   Pulse 85   Temp 98 F (36.7 C)   Ht 5\' 4"  (1.626 m)   Wt 254 lb (115.2 kg)   LMP 10/09/2014   BMI 43.60 kg/m  General:   Alert and oriented. Pleasant and cooperative. Well-nourished and well-developed.  Eyes:  Without icterus, sclera clear and conjunctiva pink.  Ears:  Normal auditory acuity. Cardiovascular:  S1, S2 present without murmurs appreciated. Extremities without clubbing  or edema. Respiratory:  Clear to auscultation bilaterally. No wheezes, rales, or rhonchi. No distress.  Gastrointestinal:  +BS, soft, non-tender and non-distended. No HSM noted. No guarding or rebound. No masses appreciated.  Rectal:  Deferred  Musculoskalatal:  Symmetrical without gross deformities. Neurologic:  Alert and oriented x4;  grossly normal neurologically. Psych:  Alert and cooperative. Normal mood and affect. Heme/Lymph/Immune: No excessive bruising noted.    03/01/2019 12:07 PM   Disclaimer: This note was dictated with voice recognition software. Similar sounding words can inadvertently be transcribed and may not be corrected upon review.

## 2019-03-01 NOTE — Patient Instructions (Signed)
Your health issues we discussed today were:   GERD (reflux/heartburn) and upper abdominal pain: 1. Because your medications were working well for you until your thyroid levels are off, let us give that a couple months for your thyroid levels to get under control 2. Follow-up with your endocrinologist based on recommendations for appropriate dosing of Synthroid 3. If when we see you back you are still having symptoms we can discuss other options including different medications and any possible further work-up 4. Continue take the Carafate that the ER prescribed you 5. Call us if you have any severe worsening symptoms  Overall I recommend:  1. Continue your other current medications 2. Follow-up in 2 months 3. Call us if you have any questions or concerns.   Because of recent events of COVID-19 ("Coronavirus"), follow CDC recommendations:  1. Wash your hand frequently 2. Avoid touching your face 3. Stay away from people who are sick 4. If you have symptoms such as fever, cough, shortness of breath then call your healthcare provider for further guidance 5. If you are sick, STAY AT HOME unless otherwise directed by your healthcare provider. 6. Follow directions from state and national officials regarding staying safe   At [facility] we value your feedback. You may receive a survey about your visit today. Please share your experience as we strive to create trusting relationships with our patients to provide genuine, compassionate, quality care.  We appreciate your understanding and patience as we review any laboratory studies, imaging, and other diagnostic tests that are ordered as we care for you. Our office policy is 5 business days for review of these results, and any emergent or urgent results are addressed in a timely manner for your best interest. If you do not hear from our office in 1 week, please contact us.   We also encourage the use of MyChart, which contains your  medical information for your review as well. If you are not enrolled in this feature, an access code is on this after visit summary for your convenience. Thank you for allowing [place] to be involved in your care.  It was great to see you today!  I hope you have a great day!!

## 2019-03-01 NOTE — Assessment & Plan Note (Signed)
Having some breakthrough GERD symptoms and epigastric pain.  Her thyroid levels were recently off and she states she often has a lot of GI issues when her thyroid is off.  If any dose adjustments.  She is continuing on her twice daily PPI which was working for a while until her thyroid was found to be off.  She was given Carafate by the [facility] ER.  This helps some.  I recommend she take it as they recommended.  She will have her thyroid levels rechecked at the end of this month to check for effectiveness of Synthroid dosing.  At this point I think we will continue her on her current medications that were previously working well for her.  We will see her back in 2 months to allow adequate time for Synthroid levels to adjust.  If she still having symptoms at that time we can explore other evaluation or treatment options including switching to a different PPI.  Follow-up in 2 months.

## 2019-03-01 NOTE — Assessment & Plan Note (Signed)
Epigastric abdominal pain associated with her flare and GERD symptoms in the setting of abnormal thyroid levels.  Status post thyroidectomy and now on Synthroid for life.  She typically has a lot of GI symptoms when her thyroid levels are off.  Further work-up as per above.  I feel her epigastric pain is likely due to flaring GERD.  Follow-up in 2 months.

## 2019-03-02 DIAGNOSIS — I10 Essential (primary) hypertension: Secondary | ICD-10-CM | POA: Diagnosis not present

## 2019-03-02 DIAGNOSIS — R400 Somnolence: Secondary | ICD-10-CM | POA: Diagnosis not present

## 2019-03-02 DIAGNOSIS — Z1389 Encounter for screening for other disorder: Secondary | ICD-10-CM | POA: Diagnosis not present

## 2019-03-02 DIAGNOSIS — F419 Anxiety disorder, unspecified: Secondary | ICD-10-CM | POA: Diagnosis not present

## 2019-03-02 DIAGNOSIS — Z6841 Body Mass Index (BMI) 40.0 and over, adult: Secondary | ICD-10-CM | POA: Diagnosis not present

## 2019-03-02 NOTE — Progress Notes (Signed)
CC'D TO PCP °

## 2019-03-14 ENCOUNTER — Telehealth: Payer: Self-pay | Admitting: Internal Medicine

## 2019-03-14 NOTE — Telephone Encounter (Signed)
Lmom, waiting on a return call.  

## 2019-03-14 NOTE — Telephone Encounter (Signed)
Pt was seen on 03/01/2019 and called this afternoon saying that she isn't feeling any better (abd pain) and wanted to know what EG or RMR recommended. Please advise. [phone]

## 2019-03-16 ENCOUNTER — Telehealth: Payer: Self-pay | Admitting: *Deleted

## 2019-03-16 NOTE — Telephone Encounter (Signed)
H.pylori test cannot be done while on PPI (pantoprazole). She would have to stop for 2 weeks.  I would suggest trying different PPI. We could try generic prilosec, aciphex, prevacid, or nexium if she hasn't tried any of these.   OTHERWISE WOULD WAIT UNTIL EG BACK NEXT WEEK AND ASK RECOMMENDATIONS

## 2019-03-16 NOTE — Telephone Encounter (Signed)
Spoke with pt. She is taking Carafate 4 times daily, taking Pantoprazole 2 times daily. Pt is having burning sensations and more gas on her stomach that wont move. Pt is taking Phazme for the gas. PT said EG mentioned the appointment before last that pt could get a H. Pylori test done. Pt doesn't know what she should take to help the gas or heart burn.

## 2019-03-16 NOTE — Telephone Encounter (Signed)
Pt following up on a message from yesterday.  Pt having a lot of acid reflux and stomach pain.  Can we call her at [phone]?

## 2019-03-17 MED ORDER — RABEPRAZOLE SODIUM 20 MG PO TBEC: 20.0000 mg | DELAYED_RELEASE_TABLET | Freq: Every day | ORAL | 3 refills | Status: DC

## 2019-03-17 NOTE — Telephone Encounter (Signed)
PT has previously tried the Nexium and prilosec. Would like to try aciphex or prevacid.

## 2019-03-17 NOTE — Telephone Encounter (Signed)
RX sent for aciphex  SAVE FOR FURTHER RECOMMENDATIONS BY EG

## 2019-03-17 NOTE — Addendum Note (Signed)
Addended by: Mahala Menghini on: 03/17/2019 04:40 PM   Modules accepted: Orders

## 2019-03-18 NOTE — Telephone Encounter (Signed)
Lmom, waiting on a return call.  

## 2019-03-18 NOTE — Telephone Encounter (Signed)
See other phone note for documentation.  

## 2019-03-22 NOTE — Telephone Encounter (Signed)
Pt called with c/o Aciphex. Pt started taking after LSL changed medication per pts request. Pt was still having some burning sensations with Pantoprazole. Pt states after taking Aciphex, she's had diarrhea and dizziness. Pt is also suppose to have a H. Pylori breath test and is suppose to be off Pantoprazole 2 weeks before taking test. Pt was advised not to take Aciphex again until speaking with a provider due to having dizziness after taking medication.

## 2019-03-22 NOTE — Telephone Encounter (Signed)
Left a detailed message for pt. Pt is aware that RX was sent to her pharmacy.

## 2019-03-25 ENCOUNTER — Other Ambulatory Visit: Payer: Self-pay

## 2019-03-26 DIAGNOSIS — Z880 Allergy status to penicillin: Secondary | ICD-10-CM | POA: Diagnosis not present

## 2019-03-26 DIAGNOSIS — F419 Anxiety disorder, unspecified: Secondary | ICD-10-CM | POA: Diagnosis not present

## 2019-03-26 DIAGNOSIS — K219 Gastro-esophageal reflux disease without esophagitis: Secondary | ICD-10-CM | POA: Diagnosis not present

## 2019-03-26 DIAGNOSIS — R1084 Generalized abdominal pain: Secondary | ICD-10-CM | POA: Diagnosis not present

## 2019-03-26 DIAGNOSIS — E0590 Thyrotoxicosis, unspecified without thyrotoxic crisis or storm: Secondary | ICD-10-CM | POA: Diagnosis not present

## 2019-03-26 DIAGNOSIS — D72829 Elevated white blood cell count, unspecified: Secondary | ICD-10-CM | POA: Diagnosis not present

## 2019-03-26 DIAGNOSIS — Z87891 Personal history of nicotine dependence: Secondary | ICD-10-CM | POA: Diagnosis not present

## 2019-03-26 DIAGNOSIS — R109 Unspecified abdominal pain: Secondary | ICD-10-CM | POA: Diagnosis not present

## 2019-03-26 DIAGNOSIS — Z79899 Other long term (current) drug therapy: Secondary | ICD-10-CM | POA: Diagnosis not present

## 2019-03-26 DIAGNOSIS — R112 Nausea with vomiting, unspecified: Secondary | ICD-10-CM | POA: Diagnosis not present

## 2019-03-28 DIAGNOSIS — E0590 Thyrotoxicosis, unspecified without thyrotoxic crisis or storm: Secondary | ICD-10-CM | POA: Diagnosis not present

## 2019-03-28 DIAGNOSIS — R1084 Generalized abdominal pain: Secondary | ICD-10-CM | POA: Diagnosis not present

## 2019-03-28 DIAGNOSIS — Z880 Allergy status to penicillin: Secondary | ICD-10-CM | POA: Diagnosis not present

## 2019-03-28 DIAGNOSIS — R197 Diarrhea, unspecified: Secondary | ICD-10-CM | POA: Diagnosis not present

## 2019-03-28 DIAGNOSIS — K219 Gastro-esophageal reflux disease without esophagitis: Secondary | ICD-10-CM | POA: Diagnosis not present

## 2019-03-28 DIAGNOSIS — Z881 Allergy status to other antibiotic agents status: Secondary | ICD-10-CM | POA: Diagnosis not present

## 2019-03-28 DIAGNOSIS — F419 Anxiety disorder, unspecified: Secondary | ICD-10-CM | POA: Diagnosis not present

## 2019-03-28 DIAGNOSIS — Z8249 Family history of ischemic heart disease and other diseases of the circulatory system: Secondary | ICD-10-CM | POA: Diagnosis not present

## 2019-03-28 DIAGNOSIS — Z79899 Other long term (current) drug therapy: Secondary | ICD-10-CM | POA: Diagnosis not present

## 2019-03-28 DIAGNOSIS — Z87891 Personal history of nicotine dependence: Secondary | ICD-10-CM | POA: Diagnosis not present

## 2019-03-29 ENCOUNTER — Ambulatory Visit (INDEPENDENT_AMBULATORY_CARE_PROVIDER_SITE_OTHER): Payer: BC Managed Care – PPO | Admitting: Endocrinology

## 2019-03-29 ENCOUNTER — Encounter: Payer: Self-pay | Admitting: Endocrinology

## 2019-03-29 ENCOUNTER — Other Ambulatory Visit: Payer: Self-pay

## 2019-03-29 VITALS — BP 116/78 | HR 90 | Ht 64.0 in | Wt 246.6 lb

## 2019-03-29 DIAGNOSIS — E890 Postprocedural hypothyroidism: Secondary | ICD-10-CM

## 2019-03-29 DIAGNOSIS — D72829 Elevated white blood cell count, unspecified: Secondary | ICD-10-CM | POA: Diagnosis not present

## 2019-03-29 DIAGNOSIS — E559 Vitamin D deficiency, unspecified: Secondary | ICD-10-CM

## 2019-03-29 MED ORDER — LOSARTAN POTASSIUM 50 MG PO TABS: 50.0000 mg | ORAL_TABLET | Freq: Every day | ORAL | 11 refills | Status: DC

## 2019-03-29 MED ORDER — LOSARTAN POTASSIUM 50 MG PO TABS: 25.0000 mg | ORAL_TABLET | Freq: Every day | ORAL | 11 refills | Status: DC

## 2019-03-29 NOTE — Patient Instructions (Addendum)
Blood tests are requested for you today.  We'll let you know about the results.  I would be happy to see you back here as needed.   

## 2019-03-29 NOTE — Progress Notes (Signed)
Subjective:    Patient ID: Heather Fry, female    DOB: 03/22/1976, 43 y.o.   MRN: [id number]  HPI Pt returns for f/u of postsurgical hypothyroidism (she had thyroidectomy 1/20, for hyperthyroidism; she was rx'ed synthroid soon thereafter).  She takes synthroid as rx'ed.   She also has mild postsurgical hypocalcemia (she took Tums 2-BID; in 1/20, she was seen in ER with facial tingling sxs after it was d/c'ed; she successfully stopped it 4/20).   She was seen in ER last weekend with diarrhea.  Mg++ was increased to 2 per day.  She was given IV-K+, but did not require any f/u PO.  She was also noted to have elev wbc.  pt states slight dizziness sensation in the head, and assoc fatigue.  She takes meds as rx'ed.   Past Medical History:  Diagnosis Date  . Anxiety   . Arthritis   . Depression   . Diverticulosis   . GERD (gastroesophageal reflux disease)   . Graves disease   . Internal hemorrhoids   . Thyroid disease   . Uterine fibroid     Past Surgical History:  Procedure Laterality Date  . ABDOMINAL HYSTERECTOMY     Per patient, uterine fibroids.  . ABDOMINAL HYSTERECTOMY    . CHOLECYSTECTOMY N/A 11/06/2014   Procedure: LAPAROSCOPIC CHOLECYSTECTOMY;  Surgeon: Jamesetta So, MD;  Location: [facility];  Service: General;  Laterality: N/A;  . COLONOSCOPY N/A 03/04/2017   Procedure: COLONOSCOPY;  Surgeon: Daneil Dolin, MD;  Location: [facility];  Service: Endoscopy;  Laterality: N/A;  8:30am  . DILATION AND CURETTAGE OF UTERUS    . ESOPHAGOGASTRODUODENOSCOPY N/A 10/02/2014   Procedure: ESOPHAGOGASTRODUODENOSCOPY (EGD);  Surgeon: Daneil Dolin, MD;  Location: [facility];  Service: Endoscopy;  Laterality: N/A;  945am - moved to 8:45 - Ginger notified pt  . MOUTH SURGERY     extraction of teeth  . POLYPECTOMY  03/04/2017   Procedure: POLYPECTOMY;  Surgeon: Daneil Dolin, MD;  Location: [facility];  Service: Endoscopy;;  colon  . THYROIDECTOMY N/A 09/30/2018   Procedure: TOTAL  THYROIDECTOMY;  Surgeon: Armandina Gemma, MD;  Location: [facility];  Service: General;  Laterality: N/A;  . TUBAL LIGATION      Social History   Socioeconomic History  . Marital status: Married    Spouse name: Not on file  . Number of children: Not on file  . Years of education: Not on file  . Highest education level: Not on file  Occupational History  . Not on file  Social Needs  . Financial resource strain: Not on file  . Food insecurity    Worry: Not on file    Inability: Not on file  . Transportation needs    Medical: Not on file    Non-medical: Not on file  Tobacco Use  . Smoking status: Former Smoker    Packs/day: 0.50    Years: 3.00    Pack years: 1.50    Types: Cigarettes    Quit date: 11/03/1997    Years since quitting: 21.4  . Smokeless tobacco: Never Used  . Tobacco comment: 1 pack 1 week  Substance and Sexual Activity  . Alcohol use: No    Alcohol/week: 0.0 standard drinks  . Drug use: No  . Sexual activity: Yes    Birth control/protection: Surgical  Lifestyle  . Physical activity    Days per week: Not on file    Minutes per session: Not on  file  . Stress: Not on file  Relationships  . Social Herbalist on phone: Not on file    Gets together: Not on file    Attends religious service: Not on file    Active member of club or organization: Not on file    Attends meetings of clubs or organizations: Not on file    Relationship status: Not on file  . Intimate partner violence    Fear of current or ex partner: Not on file    Emotionally abused: Not on file    Physically abused: Not on file    Forced sexual activity: Not on file  Other Topics Concern  . Not on file  Social History Narrative  . Not on file    Current Outpatient Medications on File Prior to Visit  Medication Sig Dispense Refill  . acetaminophen (TYLENOL) 500 MG tablet Take 1,000 mg by mouth daily as needed for moderate pain.    . Camphor-Eucalyptus-Menthol (VICKS VAPORUB EX) Apply  1 application topically daily as needed (congestion/sinus headaches).    . Cholecalciferol (VITAMIN D3) 125 MCG (5000 UT) CAPS Take 5,000 Units by mouth at bedtime.    Marland Kitchen DM-APAP-CPM (CORICIDIN HBP PO) Take by mouth as needed.     Marland Kitchen escitalopram (LEXAPRO) 20 MG tablet Take 1 tablet (20 mg total) by mouth at bedtime. 30 tablet 5  . Magnesium 250 MG TABS Take by mouth.    . Omega-3 Fatty Acids (FISH OIL) 1000 MG CAPS Take 1,000 mg by mouth at bedtime.    . simethicone (PHAZYME) 125 MG chewable tablet Chew 125-250 mg by mouth every 6 (six) hours as needed for flatulence.     . sucralfate (CARAFATE) 1 g tablet Take 1 g by mouth 4 (four) times daily -  with meals and at bedtime.    Marland Kitchen SYNTHROID 125 MCG tablet Take 1 tablet (125 mcg total) by mouth daily before breakfast. 90 tablet 3  . Wheat Dextrin (BENEFIBER DRINK MIX PO) Take 1 Dose by mouth 3 (three) times daily.     . pantoprazole (PROTONIX) 40 MG tablet Take 40 mg by mouth 2 (two) times a day.     . RABEprazole (ACIPHEX) 20 MG tablet Take 1 tablet (20 mg total) by mouth daily before breakfast. 30 tablet 3   No current facility-administered medications on file prior to visit.     Allergies  Allergen Reactions  . Levofloxacin Other (See Comments)    Pt can not have due to taking Lexapro   . Penicillins Other (See Comments)    Loopy, childhood allergy DID THE REACTION INVOLVE: Swelling of the face/tongue/throat, SOB, or low BP? Unknown Sudden or severe rash/hives, skin peeling, or the inside of the mouth or nose? Unknown Did it require medical treatment? Yes When did it last happen?Childhood allergy If all above answers are "NO", may proceed with cephalosporin use.     Family History  Problem Relation Age of Onset  . Hypertension Father   . Diabetes Father   . Depression Other   . Uterine cancer Mother        ?cervical cancer  . Uterine cancer Sister        suicide  . Colon cancer Paternal Aunt 40  . Thyroid disease Neg Hx      BP 116/78 (BP Location: Left Arm, Patient Position: Sitting, Cuff Size: Large)   Pulse 90   Ht 5\' 4"  (1.626 m)   Wt 246 lb 9.6 oz (  111.9 kg)   LMP 10/09/2014   SpO2 94%   BMI 42.33 kg/m   Review of Systems She has arthralgias, but no numbness.      Objective:   Physical Exam VITAL SIGNS:  See vs page GENERAL: no distress Ext: trace bilat leg edema  Lab Results  Component Value Date   WBC 7.2 03/29/2019   HGB 13.2 03/29/2019   HCT 39.4 03/29/2019   MCV 89.9 03/29/2019   PLT 251.0 03/29/2019   Lab Results  Component Value Date   TSH 2.49 03/29/2019   Lab Results  Component Value Date   CREATININE 0.83 03/29/2019   BUN 6 03/29/2019   NA 140 03/29/2019   K 4.1 03/29/2019   CL 103 03/29/2019   CO2 28 03/29/2019   Lab Results  Component Value Date   PTH 11 (L) 03/29/2019   CALCIUM 9.2 03/29/2019   CALCIUM 8.9 03/29/2019   CAION 1.15 10/09/2018   PHOS 3.1 03/29/2019       Assessment & Plan:  Leukocytosis, uncertain etiology, resolved.  Hypothyroidism: well-replaced. Hypocalcemia: well-controlled. Please continue the same medications   Patient Instructions  Blood tests are requested for you today.  We'll let you know about the results.  I would be happy to see you back here as needed.

## 2019-03-30 LAB — CBC WITH DIFFERENTIAL/PLATELET
Basophils Absolute: 0 10*3/uL (ref 0.0–0.1)
Basophils Relative: 0.6 % (ref 0.0–3.0)
Eosinophils Absolute: 0.1 10*3/uL (ref 0.0–0.7)
Eosinophils Relative: 0.8 % (ref 0.0–5.0)
HCT: 39.4 % (ref 36.0–46.0)
Hemoglobin: 13.2 g/dL (ref 12.0–15.0)
Lymphocytes Relative: 21.3 % (ref 12.0–46.0)
Lymphs Abs: 1.5 10*3/uL (ref 0.7–4.0)
MCHC: 33.6 g/dL (ref 30.0–36.0)
MCV: 89.9 fl (ref 78.0–100.0)
Monocytes Absolute: 0.5 10*3/uL (ref 0.1–1.0)
Monocytes Relative: 6.5 % (ref 3.0–12.0)
Neutro Abs: 5.1 10*3/uL (ref 1.4–7.7)
Neutrophils Relative %: 70.8 % (ref 43.0–77.0)
Platelets: 251 10*3/uL (ref 150.0–400.0)
RBC: 4.38 Mil/uL (ref 3.87–5.11)
RDW: 13.8 % (ref 11.5–15.5)
WBC: 7.2 10*3/uL (ref 4.0–10.5)

## 2019-03-30 LAB — MAGNESIUM: Magnesium: 1.9 mg/dL (ref 1.5–2.5)

## 2019-03-30 LAB — BASIC METABOLIC PANEL
BUN: 6 mg/dL (ref 6–23)
CO2: 28 mEq/L (ref 19–32)
Calcium: 8.9 mg/dL (ref 8.4–10.5)
Chloride: 103 mEq/L (ref 96–112)
Creatinine, Ser: 0.83 mg/dL (ref 0.40–1.20)
GFR: 75 mL/min (ref >60.00)
Glucose, Bld: 95 mg/dL (ref 70–99)
Potassium: 4.1 mEq/L (ref 3.5–5.1)
Sodium: 140 mEq/L (ref 135–145)

## 2019-03-30 LAB — T4, FREE: Free T4: 0.75 ng/dL (ref 0.60–1.60)

## 2019-03-30 LAB — TSH: TSH: 2.49 u[IU]/mL (ref 0.35–4.50)

## 2019-03-30 LAB — PTH, INTACT AND CALCIUM
Calcium: 9.2 mg/dL (ref 8.6–10.2)
PTH: 11 pg/mL — ABNORMAL LOW (ref 14–64)

## 2019-03-30 LAB — PHOSPHORUS: Phosphorus: 3.1 mg/dL (ref 2.3–4.6)

## 2019-03-30 LAB — VITAMIN D 25 HYDROXY (VIT D DEFICIENCY, FRACTURES): VITD: 40.21 ng/mL (ref 30.00–100.00)

## 2019-03-31 DIAGNOSIS — K529 Noninfective gastroenteritis and colitis, unspecified: Secondary | ICD-10-CM | POA: Diagnosis not present

## 2019-03-31 DIAGNOSIS — Z681 Body mass index (BMI) 19 or less, adult: Secondary | ICD-10-CM | POA: Diagnosis not present

## 2019-03-31 DIAGNOSIS — I10 Essential (primary) hypertension: Secondary | ICD-10-CM | POA: Diagnosis not present

## 2019-03-31 DIAGNOSIS — G4733 Obstructive sleep apnea (adult) (pediatric): Secondary | ICD-10-CM | POA: Diagnosis not present

## 2019-04-07 ENCOUNTER — Telehealth: Payer: Self-pay | Admitting: Gastroenterology

## 2019-04-07 ENCOUNTER — Other Ambulatory Visit: Payer: Self-pay

## 2019-04-07 ENCOUNTER — Ambulatory Visit (INDEPENDENT_AMBULATORY_CARE_PROVIDER_SITE_OTHER): Payer: BC Managed Care – PPO | Admitting: Gastroenterology

## 2019-04-07 ENCOUNTER — Encounter: Payer: Self-pay | Admitting: Gastroenterology

## 2019-04-07 ENCOUNTER — Ambulatory Visit (HOSPITAL_COMMUNITY)
Admission: RE | Admit: 2019-04-07 | Discharge: 2019-04-07 | Disposition: A | Discharge status: Home / Self Care | Payer: BC Managed Care – PPO | Source: Ambulatory Visit | Attending: Gastroenterology | Admitting: Gastroenterology

## 2019-04-07 VITALS — BP 150/98 | HR 89 | Temp 98.3°F | Ht 64.0 in | Wt 245.0 lb

## 2019-04-07 DIAGNOSIS — R1031 Right lower quadrant pain: Secondary | ICD-10-CM | POA: Diagnosis not present

## 2019-04-07 DIAGNOSIS — K219 Gastro-esophageal reflux disease without esophagitis: Secondary | ICD-10-CM

## 2019-04-07 DIAGNOSIS — R1011 Right upper quadrant pain: Secondary | ICD-10-CM

## 2019-04-07 DIAGNOSIS — R109 Unspecified abdominal pain: Secondary | ICD-10-CM | POA: Diagnosis not present

## 2019-04-07 IMAGING — CT CT ABDOMEN AND PELVIS WITH CONTRAST
2 of 8 series · 12 of 46 positions shown, 14 images · IV contrast (Isovue)
Comparison: [DATE]

CLINICAL DATA: Left upper quadrant pain and right lower quadrant
pain for 2 weeks

EXAM:
CT ABDOMEN AND PELVIS WITH CONTRAST
TECHNIQUE: Multidetector CT imaging of the abdomen and pelvis was performed
using the standard protocol following bolus administration of
intravenous contrast.
CONTRAST:  100mL OMNIPAQUE IOHEXOL 300 MG/ML  SOLN

[Series 2: axial st · axial · 0.79mm/px · z∈[-479,-79]mm · 9 of 100 slices shown, 11 images]
[im 10/100  soft-tissue]
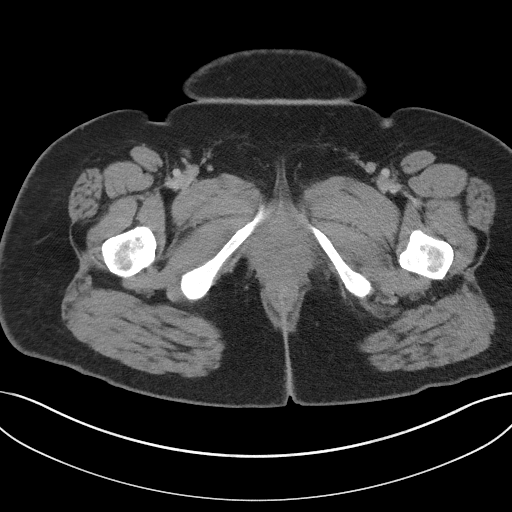
[im 10/100  bone]
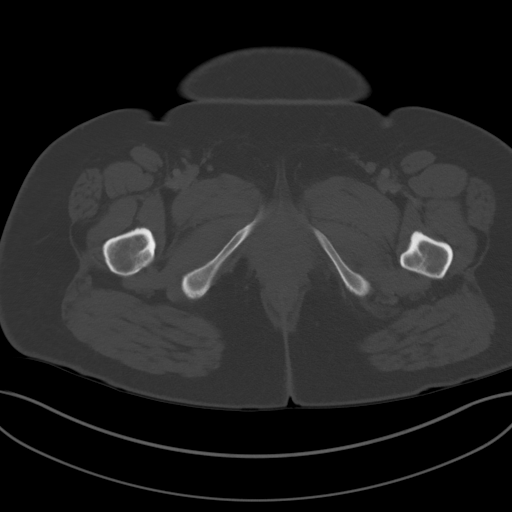
[im 20/100  soft-tissue]
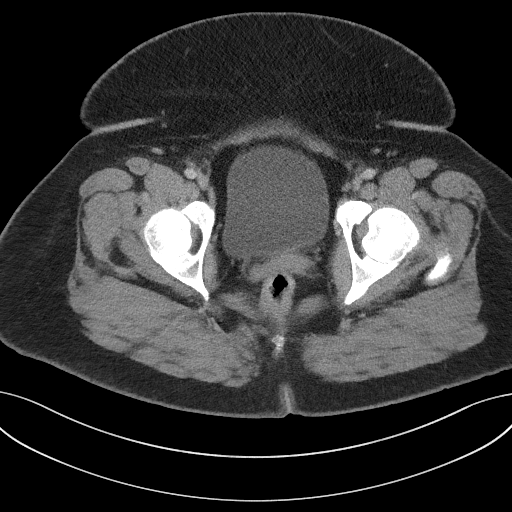
[im 30/100  soft-tissue]
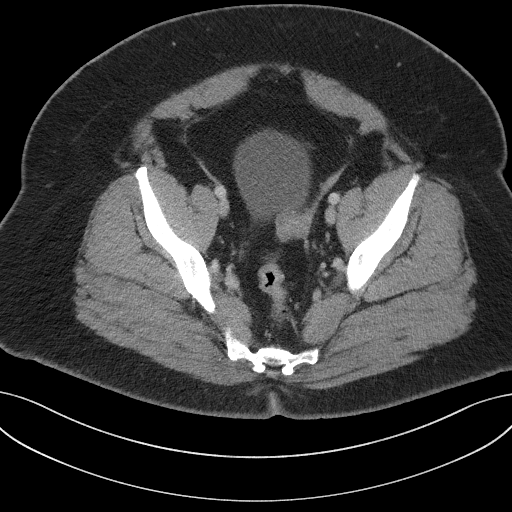
[im 40/100  soft-tissue]
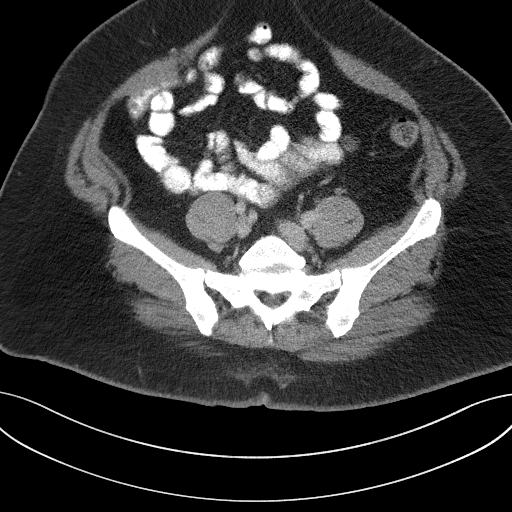
[im 50/100  soft-tissue]
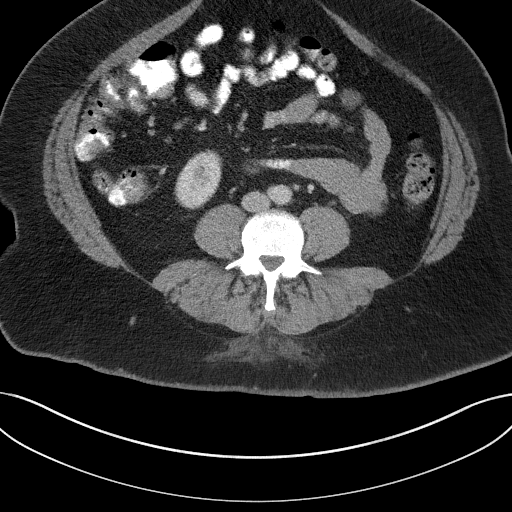
[im 60/100  soft-tissue]
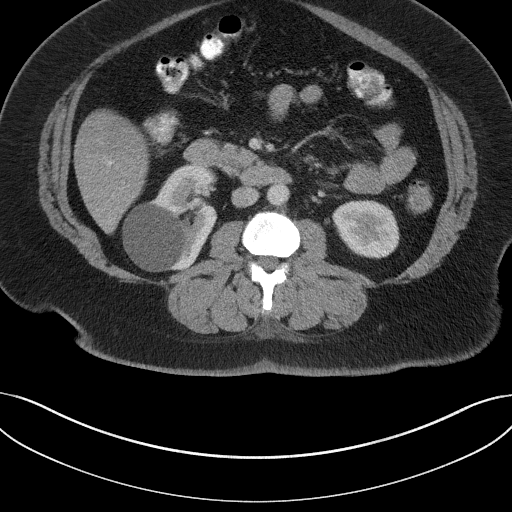
[im 70/100  soft-tissue]
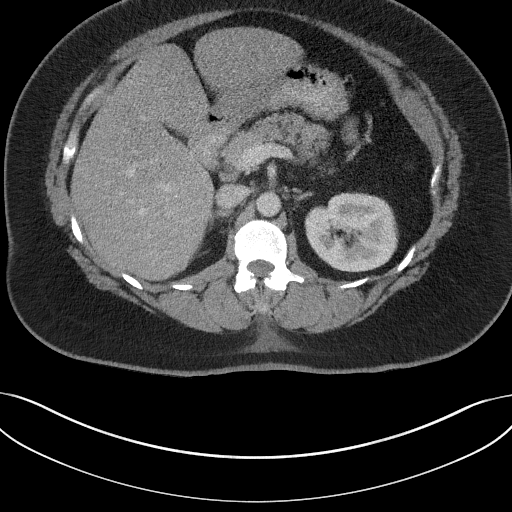
[im 80/100  soft-tissue]
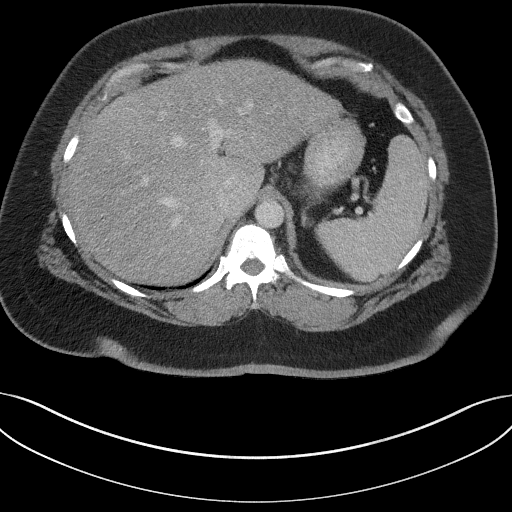
[im 90/100  soft-tissue]
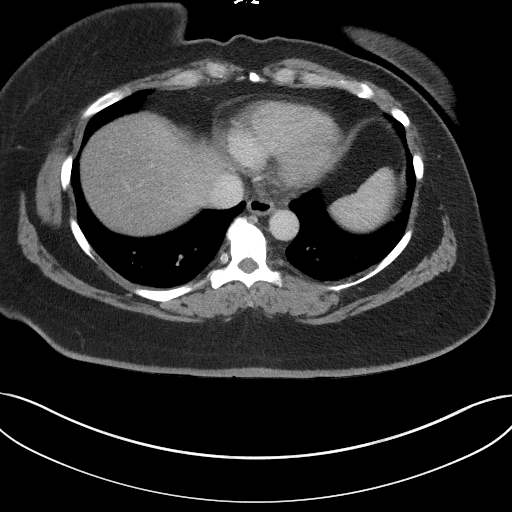
[im 90/100  bone]
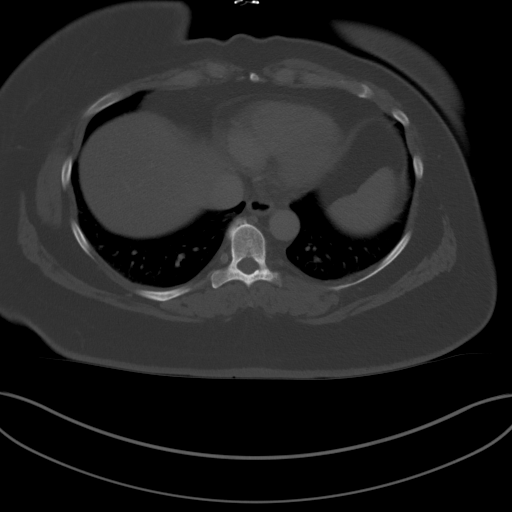

[Series 8: coronal st · coronal · 0.87mm/px · 3 of 116 slices shown]
[im 39/116  soft-tissue]
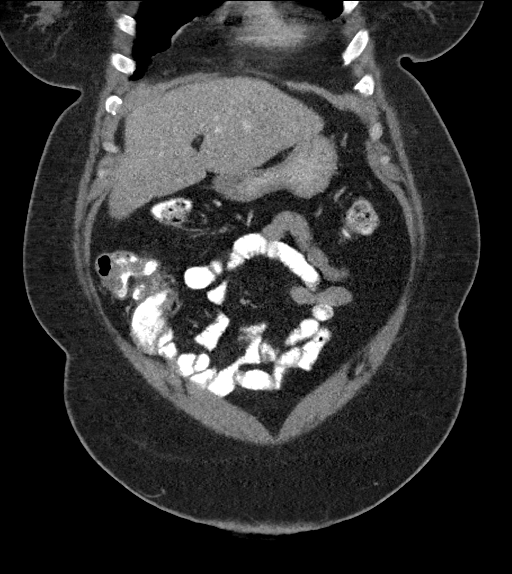
[im 52/116  soft-tissue]
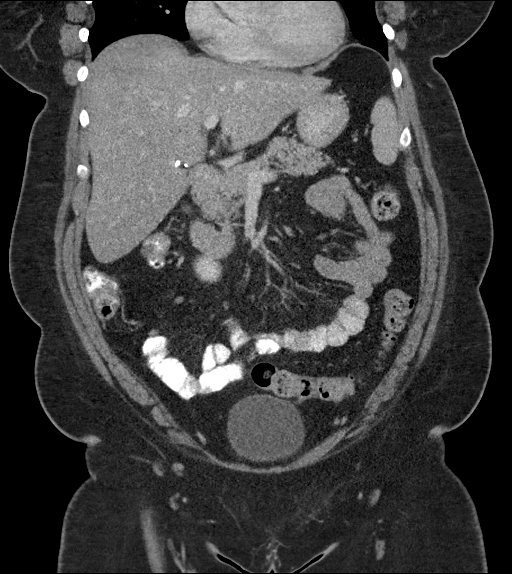
[im 64/116  soft-tissue]
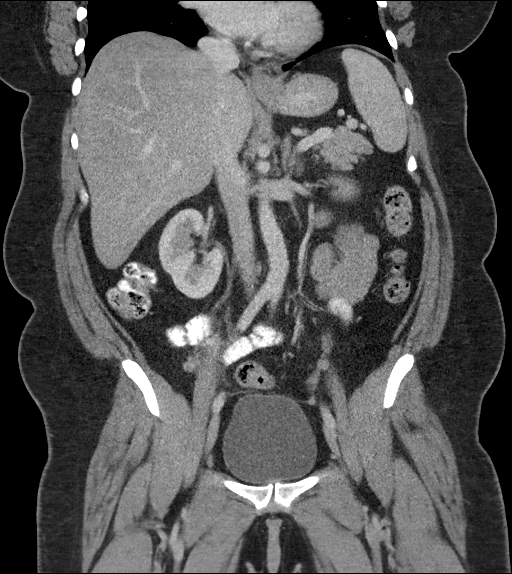

[12 of 46 positions shown; findings below may reference images not displayed]

FINDINGS: Lower chest: No acute abnormality.

Hepatobiliary: No focal liver abnormality is seen. Status post
cholecystectomy. No biliary dilatation.

Pancreas: Unremarkable. No pancreatic ductal dilatation or
surrounding inflammatory changes.

Spleen: Normal in size without focal abnormality.

Adrenals/Urinary Tract: Normal adrenal glands. 5.3 cm hypodense,
fluid attenuating right renal mass consistent with a cyst. No
urolithiasis or obstructive uropathy. Normal bladder. Diverticulosis
without evidence of diverticulitis.

Stomach/Bowel: Stomach is within normal limits. Appendix appears
normal. No evidence of bowel wall thickening, distention, or
inflammatory changes.

Vascular/Lymphatic: No significant vascular findings are present. No
enlarged abdominal or pelvic lymph nodes.

Reproductive: Uterus and bilateral adnexa are unremarkable.

Other: Rectus diastasis.  No inguinal hernia.  No ascites.

Musculoskeletal: No acute osseous abnormality. No aggressive osseous
lesion.
IMPRESSION: 1. No acute abdominal or pelvic pathology.
2. Normal appendix.

## 2019-04-07 MED ORDER — IOHEXOL 300 MG/ML  SOLN
100.0000 mL | Freq: Once | INTRAMUSCULAR | Status: AC | PRN
  Administered 2019-04-07: 100 mL via INTRAVENOUS

## 2019-04-07 NOTE — Patient Instructions (Signed)
1. CT scan today as scheduled. We will contact you with results as available, no later than tomorrow morning. If symptoms worsen, go to ER.

## 2019-04-07 NOTE — Telephone Encounter (Signed)
Please let patient know CT findings. See result note.

## 2019-04-07 NOTE — Assessment & Plan Note (Signed)
43 y/o female with h/o GERD/IBS presenting with recent onset RLQ pain with radiation into RUQ associated with bloating. No fever. Recent reported leukocytosis resolved. She was in ED recently with acute onset diarrhea, possible food borne illness. Patient has moderate RLQ tenderness on exam. 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 We discuss trial of antispasmotic with possible need for CT if persistent or worsening symptoms vs proceeding with CT now. She prefers CT today based on her pain.

## 2019-04-07 NOTE — Patient Instructions (Signed)
Called [place], [place] for CT abd/pelvis w/contrast approved. PA# [id number], valid till 08/05/19.

## 2019-04-07 NOTE — Progress Notes (Signed)
Primary Care Physician: Redmond School, MD  Primary Gastroenterologist:  Garfield Cornea, MD   Chief Complaint  Patient presents with  . Gastroesophageal Reflux    omeprazole otc 20mg  bid  . Abdominal Pain    rt lower, pt went to the ED last week stomach virus    HPI: Heather Fry is a 43 y.o. female here for follow-up.  She was last seen on March 01, 2019.  History of refractory GERD, left upper quadrant pain, nausea.  Pantoprazole was increased to twice daily back in May when she was seen for refractory symptoms.  Noted improvement initially.  She called since her last office visit in June and we switched her from pantoprazole to AcipHex.  On AcipHex she complained of diarrhea and dizziness.  Currently she is on omeprazole 20 mg twice daily.  EGD in 2016 with fundic gland polyps, hiatal hernia.  CT abdomen pelvis with contrast May 2019 for well in the ED for abdominal pain showed colonic diverticulosis, hiatal hernia.  Patient states she was seen in ED at [facility] recently with acute gastroenteritis. Symptoms started within hours of eating at [employer]. She had profuse diarrhea for several days associated with upper abdominal cramps, nausea. She was given fluids and Zofran. No imaging.   Stools are now regular. No further diarrhea. Two days ago she started having RLQ pain with some radiation into RUQ, bloating. Last night pain more constant. No fever. No vomiting. Associated with bloating sensation. No melena, brbpr. Reflux now controlled on omeprazole BID recently started during ED visit. Reports long h/o IBS.   Reports mild leukocytosis while in ED recently. Repeat labs two days ago with normal WBC.    Current Outpatient Medications  Medication Sig Dispense Refill  . acetaminophen (TYLENOL) 500 MG tablet Take 1,000 mg by mouth daily as needed for moderate pain.    . Camphor-Eucalyptus-Menthol (VICKS VAPORUB EX) Apply 1 application topically daily as needed (congestion/sinus  headaches).    . Cholecalciferol (VITAMIN D3) 125 MCG (5000 UT) CAPS Take 5,000 Units by mouth at bedtime.    Marland Kitchen DM-APAP-CPM (CORICIDIN HBP PO) Take by mouth as needed.     Marland Kitchen escitalopram (LEXAPRO) 20 MG tablet Take 1 tablet (20 mg total) by mouth at bedtime. 30 tablet 5  . losartan (COZAAR) 50 MG tablet Take 0.5 tablets (25 mg total) by mouth daily. 30 tablet 11  . Magnesium 250 MG TABS Take by mouth.    . Omega-3 Fatty Acids (FISH OIL) 1000 MG CAPS Take 1,000 mg by mouth at bedtime.    Marland Kitchen omeprazole (PRILOSEC) 20 MG capsule Take 20 mg by mouth 2 (two) times daily before a meal.    . simethicone (PHAZYME) 125 MG chewable tablet Chew 125-250 mg by mouth every 6 (six) hours as needed for flatulence.     . sucralfate (CARAFATE) 1 g tablet Take 1 g by mouth 4 (four) times daily -  with meals and at bedtime.    Marland Kitchen SYNTHROID 125 MCG tablet Take 1 tablet (125 mcg total) by mouth daily before breakfast. 90 tablet 3  . Wheat Dextrin (BENEFIBER DRINK MIX PO) Take 1 Dose by mouth 3 (three) times daily.      No current facility-administered medications for this visit.     Allergies as of 04/07/2019 - Review Complete 04/07/2019  Allergen Reaction Noted  . Levofloxacin Other (See Comments) 03/03/2017  . Penicillins Other (See Comments) 04/06/2012    ROS:  General: Negative for anorexia,  weight loss, fever, chills, fatigue, weakness. ENT: Negative for hoarseness, difficulty swallowing , nasal congestion. CV: Negative for chest pain, angina, palpitations, dyspnea on exertion, peripheral edema.  Respiratory: Negative for dyspnea at rest, dyspnea on exertion, cough, sputum, wheezing.  GI: See history of present illness. GU:  Negative for dysuria, hematuria, urinary incontinence, urinary frequency, nocturnal urination.  Endo: Negative for unusual weight change.    Physical Examination:   BP (!) 150/98   Pulse 89   Temp 98.3 F (36.8 C) (Oral)   Ht 5\' 4"  (1.626 m)   Wt 245 lb (111.1 kg)   LMP  10/09/2014   BMI 42.05 kg/m   General: Well-nourished, well-developed in no acute distress.  Eyes: No icterus. Mouth: Oropharyngeal mucosa moist and pink , no lesions erythema or exudate. Lungs: Clear to auscultation bilaterally.  Heart: Regular rate and rhythm, no murmurs rubs or gallops.  Abdomen: Bowel sounds are normal, moderate rlq tenderness, tiny umb hernia,+rectus diastasis, nondistended, no hepatosplenomegaly or masses, no abdominal bruits, no rebound or guarding.   Extremities: No lower extremity edema. No clubbing or deformities. Neuro: Alert and oriented x 4   Skin: Warm and dry, no jaundice.   Psych: Alert and cooperative, normal mood and affect.  Labs:  Lab Results  Component Value Date   CREATININE 0.83 03/29/2019   BUN 6 03/29/2019   NA 140 03/29/2019   K 4.1 03/29/2019   CL 103 03/29/2019   CO2 28 03/29/2019   Lab Results  Component Value Date   ALT 21 10/09/2018   AST 19 10/09/2018   ALKPHOS 59 10/09/2018   BILITOT 0.5 10/09/2018   Lab Results  Component Value Date   WBC 7.2 03/29/2019   HGB 13.2 03/29/2019   HCT 39.4 03/29/2019   MCV 89.9 03/29/2019   PLT 251.0 03/29/2019   Lab Results  Component Value Date   TSH 2.49 03/29/2019     Imaging Studies: No results found.

## 2019-04-07 NOTE — Assessment & Plan Note (Signed)
Doing well on omeprazole 20mg  po bid.

## 2019-04-08 NOTE — Telephone Encounter (Signed)
Noted  

## 2019-04-11 NOTE — Progress Notes (Signed)
cc'd to pcp 

## 2019-04-12 DIAGNOSIS — G4733 Obstructive sleep apnea (adult) (pediatric): Secondary | ICD-10-CM | POA: Diagnosis not present

## 2019-04-12 DIAGNOSIS — Z681 Body mass index (BMI) 19 or less, adult: Secondary | ICD-10-CM | POA: Diagnosis not present

## 2019-04-12 DIAGNOSIS — J329 Chronic sinusitis, unspecified: Secondary | ICD-10-CM | POA: Diagnosis not present

## 2019-04-12 DIAGNOSIS — R42 Dizziness and giddiness: Secondary | ICD-10-CM | POA: Diagnosis not present

## 2019-04-19 DIAGNOSIS — Z880 Allergy status to penicillin: Secondary | ICD-10-CM | POA: Diagnosis not present

## 2019-04-19 DIAGNOSIS — Z87891 Personal history of nicotine dependence: Secondary | ICD-10-CM | POA: Diagnosis not present

## 2019-04-19 DIAGNOSIS — R0602 Shortness of breath: Secondary | ICD-10-CM | POA: Diagnosis not present

## 2019-04-19 DIAGNOSIS — Z79899 Other long term (current) drug therapy: Secondary | ICD-10-CM | POA: Diagnosis not present

## 2019-04-19 DIAGNOSIS — K219 Gastro-esophageal reflux disease without esophagitis: Secondary | ICD-10-CM | POA: Diagnosis not present

## 2019-04-19 DIAGNOSIS — F419 Anxiety disorder, unspecified: Secondary | ICD-10-CM | POA: Diagnosis not present

## 2019-04-19 DIAGNOSIS — R079 Chest pain, unspecified: Secondary | ICD-10-CM | POA: Diagnosis not present

## 2019-04-19 DIAGNOSIS — E0590 Thyrotoxicosis, unspecified without thyrotoxic crisis or storm: Secondary | ICD-10-CM | POA: Diagnosis not present

## 2019-04-21 DIAGNOSIS — K589 Irritable bowel syndrome without diarrhea: Secondary | ICD-10-CM | POA: Diagnosis not present

## 2019-04-21 DIAGNOSIS — T50905A Adverse effect of unspecified drugs, medicaments and biological substances, initial encounter: Secondary | ICD-10-CM | POA: Diagnosis not present

## 2019-04-21 DIAGNOSIS — Z681 Body mass index (BMI) 19 or less, adult: Secondary | ICD-10-CM | POA: Diagnosis not present

## 2019-04-27 DIAGNOSIS — G4733 Obstructive sleep apnea (adult) (pediatric): Secondary | ICD-10-CM | POA: Diagnosis not present

## 2019-04-28 ENCOUNTER — Ambulatory Visit: Payer: BLUE CROSS/BLUE SHIELD | Admitting: Nurse Practitioner

## 2019-05-09 DIAGNOSIS — K760 Fatty (change of) liver, not elsewhere classified: Secondary | ICD-10-CM | POA: Diagnosis not present

## 2019-05-09 DIAGNOSIS — Z79899 Other long term (current) drug therapy: Secondary | ICD-10-CM | POA: Diagnosis not present

## 2019-05-09 DIAGNOSIS — Z87891 Personal history of nicotine dependence: Secondary | ICD-10-CM | POA: Diagnosis not present

## 2019-05-09 DIAGNOSIS — Z880 Allergy status to penicillin: Secondary | ICD-10-CM | POA: Diagnosis not present

## 2019-05-09 DIAGNOSIS — R1031 Right lower quadrant pain: Secondary | ICD-10-CM | POA: Diagnosis not present

## 2019-05-09 DIAGNOSIS — R1084 Generalized abdominal pain: Secondary | ICD-10-CM | POA: Diagnosis not present

## 2019-05-09 DIAGNOSIS — R079 Chest pain, unspecified: Secondary | ICD-10-CM | POA: Diagnosis not present

## 2019-05-10 DIAGNOSIS — E063 Autoimmune thyroiditis: Secondary | ICD-10-CM | POA: Diagnosis not present

## 2019-05-10 DIAGNOSIS — R1011 Right upper quadrant pain: Secondary | ICD-10-CM | POA: Diagnosis not present

## 2019-05-10 DIAGNOSIS — E669 Obesity, unspecified: Secondary | ICD-10-CM | POA: Diagnosis not present

## 2019-05-10 DIAGNOSIS — Z6841 Body Mass Index (BMI) 40.0 and over, adult: Secondary | ICD-10-CM | POA: Diagnosis not present

## 2019-05-10 DIAGNOSIS — I10 Essential (primary) hypertension: Secondary | ICD-10-CM | POA: Diagnosis not present

## 2019-05-13 DIAGNOSIS — E871 Hypo-osmolality and hyponatremia: Secondary | ICD-10-CM | POA: Diagnosis not present

## 2019-05-13 DIAGNOSIS — H5319 Other subjective visual disturbances: Secondary | ICD-10-CM | POA: Diagnosis not present

## 2019-05-13 DIAGNOSIS — R109 Unspecified abdominal pain: Secondary | ICD-10-CM | POA: Diagnosis not present

## 2019-05-16 ENCOUNTER — Ambulatory Visit: Payer: BC Managed Care – PPO | Admitting: Nurse Practitioner

## 2019-05-28 DIAGNOSIS — G4733 Obstructive sleep apnea (adult) (pediatric): Secondary | ICD-10-CM | POA: Diagnosis not present

## 2019-06-05 DIAGNOSIS — M545 Low back pain: Secondary | ICD-10-CM | POA: Diagnosis not present

## 2019-06-05 DIAGNOSIS — Z881 Allergy status to other antibiotic agents status: Secondary | ICD-10-CM | POA: Diagnosis not present

## 2019-06-05 DIAGNOSIS — F419 Anxiety disorder, unspecified: Secondary | ICD-10-CM | POA: Diagnosis not present

## 2019-06-05 DIAGNOSIS — K219 Gastro-esophageal reflux disease without esophagitis: Secondary | ICD-10-CM | POA: Diagnosis not present

## 2019-06-05 DIAGNOSIS — Z880 Allergy status to penicillin: Secondary | ICD-10-CM | POA: Diagnosis not present

## 2019-06-05 DIAGNOSIS — E0590 Thyrotoxicosis, unspecified without thyrotoxic crisis or storm: Secondary | ICD-10-CM | POA: Diagnosis not present

## 2019-06-05 DIAGNOSIS — Z79899 Other long term (current) drug therapy: Secondary | ICD-10-CM | POA: Diagnosis not present

## 2019-06-05 DIAGNOSIS — I10 Essential (primary) hypertension: Secondary | ICD-10-CM | POA: Diagnosis not present

## 2019-06-05 DIAGNOSIS — R1084 Generalized abdominal pain: Secondary | ICD-10-CM | POA: Diagnosis not present

## 2019-06-05 DIAGNOSIS — R109 Unspecified abdominal pain: Secondary | ICD-10-CM | POA: Diagnosis not present

## 2019-06-05 DIAGNOSIS — Z886 Allergy status to analgesic agent status: Secondary | ICD-10-CM | POA: Diagnosis not present

## 2019-06-05 DIAGNOSIS — Z87891 Personal history of nicotine dependence: Secondary | ICD-10-CM | POA: Diagnosis not present

## 2019-06-08 ENCOUNTER — Other Ambulatory Visit: Payer: Self-pay

## 2019-06-08 ENCOUNTER — Encounter: Payer: Self-pay | Admitting: Endocrinology

## 2019-06-08 ENCOUNTER — Ambulatory Visit (INDEPENDENT_AMBULATORY_CARE_PROVIDER_SITE_OTHER): Payer: BC Managed Care – PPO | Admitting: Endocrinology

## 2019-06-08 VITALS — BP 140/100 | HR 80 | Ht 64.0 in | Wt 250.2 lb

## 2019-06-08 DIAGNOSIS — E890 Postprocedural hypothyroidism: Secondary | ICD-10-CM | POA: Diagnosis not present

## 2019-06-08 DIAGNOSIS — E559 Vitamin D deficiency, unspecified: Secondary | ICD-10-CM

## 2019-06-08 MED ORDER — VITAMIN D3 125 MCG (5000 UT) PO CAPS: 10000.0000 [IU] | ORAL_CAPSULE | Freq: Every day | ORAL | 11 refills | Status: DC

## 2019-06-08 MED ORDER — LEVOTHYROXINE SODIUM 137 MCG PO TABS: 137.0000 ug | ORAL_TABLET | Freq: Every day | ORAL | 3 refills | Status: DC

## 2019-06-08 MED ORDER — MAGNESIUM 250 MG PO TABS: 500.0000 mg | ORAL_TABLET | Freq: Every day | ORAL | 11 refills | Status: DC

## 2019-06-08 NOTE — Patient Instructions (Addendum)
Your blood pressure is high today.  Please see your primary care provider soon, to have it rechecked. I have sent a prescription to your pharmacy, to increase the thyroid pill.   Please continue the same other medications.   Please come back for a follow-up appointment in 1 month.

## 2019-06-08 NOTE — Progress Notes (Signed)
Subjective:    Patient ID: Heather Fry, female    DOB: [birth year], 43 y.o.   MRN: [id number]  HPI Pt returns for f/u of postsurgical hypothyroidism (she had thyroidectomy 1/20, for hyperthyroidism; she was rx'ed synthroid soon thereafter).  She takes synthroid as rx'ed.  She was seen in [facility] ER, last week She also has mild postsurgical hypocalcemia (she took Tums 2-BID; in 1/20, she was seen in ER with facial tingling sxs after it was d/c'ed; she successfully stopped it 4/20).  She takes vit-D, 10,000 units/day.   She also has hypomagnesemia: she takes 500 mg qd.   pt still has slight dizziness sensation in the head, and assoc fatigue.  She was dx'ed with OSA.  sxs are improved on CPAP.   HTN: she brings a record of BP's.  they are 116-136/68-86.   Past Medical History:  Diagnosis Date  . Anxiety   . Arthritis   . Depression   . Diverticulosis   . GERD (gastroesophageal reflux disease)   . Graves disease   . Internal hemorrhoids   . Thyroid disease   . Uterine fibroid     Past Surgical History:  Procedure Laterality Date  . ABDOMINAL HYSTERECTOMY     Per patient, uterine fibroids.  . ABDOMINAL HYSTERECTOMY    . CHOLECYSTECTOMY N/A 11/06/2014   Procedure: LAPAROSCOPIC CHOLECYSTECTOMY;  Surgeon: Jamesetta So, MD;  Location: [facility];  Service: General;  Laterality: N/A;  . COLONOSCOPY N/A 03/04/2017   Dr. Gala Romney: Hemorrhoids, diverticulosis, benign polyp without adenomatous changes.  Next colonoscopy 10 years  . DILATION AND CURETTAGE OF UTERUS    . ESOPHAGOGASTRODUODENOSCOPY N/A 10/02/2014   Dr. Gala Romney: fundic gland polyps, hiatal hernia  . MOUTH SURGERY     extraction of teeth  . POLYPECTOMY  03/04/2017   Procedure: POLYPECTOMY;  Surgeon: Daneil Dolin, MD;  Location: [facility];  Service: Endoscopy;;  colon  . THYROIDECTOMY N/A 09/30/2018   Procedure: TOTAL THYROIDECTOMY;  Surgeon: Armandina Gemma, MD;  Location: [facility];  Service: General;  Laterality: N/A;  . TUBAL LIGATION       Social History   Socioeconomic History  . Marital status: Married    Spouse name: Not on file  . Number of children: Not on file  . Years of education: Not on file  . Highest education level: Not on file  Occupational History  . Not on file  Social Needs  . Financial resource strain: Not on file  . Food insecurity    Worry: Not on file    Inability: Not on file  . Transportation needs    Medical: Not on file    Non-medical: Not on file  Tobacco Use  . Smoking status: Former Smoker    Packs/day: 0.50    Years: 3.00    Pack years: 1.50    Types: Cigarettes    Quit date: 11/03/1997    Years since quitting: 21.6  . Smokeless tobacco: Never Used  . Tobacco comment: 1 pack 1 week  Substance and Sexual Activity  . Alcohol use: No    Alcohol/week: 0.0 standard drinks  . Drug use: No  . Sexual activity: Yes    Birth control/protection: Surgical  Lifestyle  . Physical activity    Days per week: Not on file    Minutes per session: Not on file  . Stress: Not on file  Relationships  . Social connections    Talks on phone: Not on file  Gets together: Not on file    Attends religious service: Not on file    Active member of club or organization: Not on file    Attends meetings of clubs or organizations: Not on file    Relationship status: Not on file  . Intimate partner violence    Fear of current or ex partner: Not on file    Emotionally abused: Not on file    Physically abused: Not on file    Forced sexual activity: Not on file  Other Topics Concern  . Not on file  Social History Narrative  . Not on file    Current Outpatient Medications on File Prior to Visit  Medication Sig Dispense Refill  . acetaminophen (TYLENOL) 500 MG tablet Take 1,000 mg by mouth daily as needed for moderate pain.    . Camphor-Eucalyptus-Menthol (VICKS VAPORUB EX) Apply 1 application topically daily as needed (congestion/sinus headaches).    Marland Kitchen DM-APAP-CPM (CORICIDIN HBP PO) Take by mouth  as needed.     Marland Kitchen escitalopram (LEXAPRO) 20 MG tablet Take 1 tablet (20 mg total) by mouth at bedtime. 30 tablet 5  . Omega-3 Fatty Acids (FISH OIL) 1000 MG CAPS Take 1,000 mg by mouth at bedtime.    Marland Kitchen omeprazole (PRILOSEC) 20 MG capsule Take 20 mg by mouth 2 (two) times daily before a meal.    . simethicone (PHAZYME) 125 MG chewable tablet Chew 125-250 mg by mouth every 6 (six) hours as needed for flatulence.     . Wheat Dextrin (BENEFIBER DRINK MIX PO) Take 1 Dose by mouth 3 (three) times daily.      No current facility-administered medications on file prior to visit.     Allergies  Allergen Reactions  . Levofloxacin Other (See Comments)    Pt can not have due to taking Lexapro   . Toradol [Ketorolac Tromethamine] Anaphylaxis  . Penicillins Other (See Comments)    Loopy, childhood allergy DID THE REACTION INVOLVE: Swelling of the face/tongue/throat, SOB, or low BP? Unknown Sudden or severe rash/hives, skin peeling, or the inside of the mouth or nose? Unknown Did it require medical treatment? Yes When did it last happen?Childhood allergy If all above answers are "NO", may proceed with cephalosporin use.     Family History  Problem Relation Age of Onset  . Hypertension Father   . Diabetes Father   . Depression Other   . Uterine cancer Mother        ?cervical cancer  . Uterine cancer Sister        suicide  . Colon cancer Paternal Aunt 32  . Thyroid disease Neg Hx     BP (!) 140/100 (BP Location: Left Arm, Patient Position: Sitting, Cuff Size: Large)   Pulse 80   Ht 5\' 4"  (1.626 m)   Wt 250 lb 3.2 oz (113.5 kg)   LMP 10/09/2014   SpO2 98%   BMI 42.95 kg/m   Review of Systems She has swelling throughout the body, and mood swings.  She has tingling of all 4's.  She has no menses (TAH (no BSO), in 2017).      Objective:   Physical Exam VITAL SIGNS:  See vs page.   GENERAL: no distress.   Ext: trace bilat leg edema.    outside test results are reviewed:  TSH=5.5 Ca++=9.3    Assessment & Plan:  HTN: is noted today Hypothyroidism: she needs increased rx Vit-D def: given that Ca++ was 9.3, we'll continue the same medication for  now.    Patient Instructions  Your blood pressure is high today.  Please see your primary care provider soon, to have it rechecked. I have sent a prescription to your pharmacy, to increase the thyroid pill.   Please continue the same other medications.   Please come back for a follow-up appointment in 1 month.

## 2019-06-15 ENCOUNTER — Encounter: Payer: Self-pay | Admitting: Nurse Practitioner

## 2019-06-15 ENCOUNTER — Ambulatory Visit (INDEPENDENT_AMBULATORY_CARE_PROVIDER_SITE_OTHER): Payer: BC Managed Care – PPO | Admitting: Nurse Practitioner

## 2019-06-15 DIAGNOSIS — R748 Abnormal levels of other serum enzymes: Secondary | ICD-10-CM

## 2019-06-15 DIAGNOSIS — R1013 Epigastric pain: Secondary | ICD-10-CM

## 2019-06-15 DIAGNOSIS — K5900 Constipation, unspecified: Secondary | ICD-10-CM

## 2019-06-15 NOTE — Progress Notes (Signed)
Referring Provider: Redmond School, MD Primary Care Physician:  Redmond School, MD Primary GI:  Dr. Gala Romney  NOTE: Service was provided via telemedicine and was requested by the patient due to COVID-19 pandemic.  Method of visit: Doxy.Me  Patient Location: Car (not driving)  Provider Location: Office  Reason for Phone Visit: Follow-up  The patient was consented to phone follow-up via telephone encounter including billing of the encounter (yes/no): Yes  Persons present on the phone encounter, with roles: None  Total time (minutes) spent on medical discussion: 20 minutes  Chief Complaint  Patient presents with  . Abdominal Pain    f/u. RUQ and across the top, comes/goes  . Constipation    had 2 BM's yesterday and very small, trying not to strain  . Gas    HPI:   Heather Fry is a 43 y.o. female who presents for virtual visit regarding: Abdominal pain.  The patient was last seen in our office 04/07/2019 for right upper quadrant abdominal pain, right lower quadrant abdominal pain, GERD.  History of refractory GERD, abdominal pain, nausea.  Protonix increased to twice daily and may some initial improvement which declined and she was subsequently switched to AcipHex.  However, AcipHex caused diarrhea and dizziness and is now on omeprazole 20 mg twice daily.    Last EGD 2016 with fundal gland polyps, hiatal hernia.  CT abdomen pelvis in May 2019 in the ED for abdominal pain showed colonic diverticulosis and hiatal hernia.  ED visit [facility] prior to her last visit with acute gastroenteritis that started within hours of eating at [facility] and included profuse diarrhea for several days associated with abdominal cramps.  She was given fluids and Zofran but no imaging was ordered.  At her last visit he noted stools now regular and no further diarrhea.  Started having right lower quadrant pain with some radiation to the right upper quadrant and bloating that started about 2  days prior.  No fever or vomiting.  Associated with bloating.  No hematochezia or melena.  Reflux now well managed on omeprazole twice daily.  Noted long history of irritable bowel syndrome.  Recommended continue current medications and CT scan.  If symptoms worsen proceed to the ER.  CT of the abdomen and pelvis completed 04/07/2019 which found no acute abnormality or pelvic pathology, normal appendix.  The patient was notified of her results.  Today she states she's doing ok overall. She is having significant abdominal issues. Had to go to the ER a few weeks ago for RUQ pain, had another CT which was normal. Her PCP told her she may have biliary stones; she is s/p cholecystectomy. Her Synthroid also adjusted and now having constipation as well. GERD is doing fine, no breakthrough reflux symptoms. Right-sided pain radiates around to her back. Taking phazyme which helps a little but not much. Started dicyclomine which caused chest pain and had to go to the ER for that. Currently having a bowel movement every other day with small, hard stools; avoids straining. No hematochezia but does have hemorrhoids. States had a low grade fever at [facility] and mildly elevated Lipase. Has been taking Tylenol, which helps her pain somewhat but doesn't last very long. Denies URI or flu-like symptoms. Denies loss of sense of taste or smell. Denies chest pain, dyspnea, dizziness, lightheadedness, syncope, near syncope. Denies any other upper or lower GI symptoms.  She has an appointment with [facility] in October. She has made significant dietary changes with no relief/changes.  Past Medical History:  Diagnosis Date  . Anxiety   . Arthritis   . Depression   . Diverticulosis   . GERD (gastroesophageal reflux disease)   . Graves disease   . Internal hemorrhoids   . Thyroid disease   . Uterine fibroid     Past Surgical History:  Procedure Laterality Date  . ABDOMINAL HYSTERECTOMY     Per patient, uterine fibroids.  .  ABDOMINAL HYSTERECTOMY    . CHOLECYSTECTOMY N/A 11/06/2014   Procedure: LAPAROSCOPIC CHOLECYSTECTOMY;  Surgeon: Jamesetta So, MD;  Location: [facility];  Service: General;  Laterality: N/A;  . COLONOSCOPY N/A 03/04/2017   Dr. Gala Romney: Hemorrhoids, diverticulosis, benign polyp without adenomatous changes.  Next colonoscopy 10 years  . DILATION AND CURETTAGE OF UTERUS    . ESOPHAGOGASTRODUODENOSCOPY N/A 10/02/2014   Dr. Gala Romney: fundic gland polyps, hiatal hernia  . MOUTH SURGERY     extraction of teeth  . POLYPECTOMY  03/04/2017   Procedure: POLYPECTOMY;  Surgeon: Daneil Dolin, MD;  Location: [facility];  Service: Endoscopy;;  colon  . THYROIDECTOMY N/A 09/30/2018   Procedure: TOTAL THYROIDECTOMY;  Surgeon: Armandina Gemma, MD;  Location: [facility];  Service: General;  Laterality: N/A;  . TUBAL LIGATION      Current Outpatient Medications  Medication Sig Dispense Refill  . acetaminophen (TYLENOL) 500 MG tablet Take 1,000 mg by mouth daily as needed for moderate pain.    . Camphor-Eucalyptus-Menthol (VICKS VAPORUB EX) Apply 1 application topically daily as needed (congestion/sinus headaches).    . Cholecalciferol (VITAMIN D3) 125 MCG (5000 UT) CAPS Take 2 capsules (10,000 Units total) by mouth daily. 60 capsule 11  . DM-APAP-CPM (CORICIDIN HBP PO) Take by mouth as needed.     Marland Kitchen escitalopram (LEXAPRO) 20 MG tablet Take 1 tablet (20 mg total) by mouth at bedtime. 30 tablet 5  . levothyroxine (SYNTHROID) 137 MCG tablet Take 1 tablet (137 mcg total) by mouth daily before breakfast. 90 tablet 3  . Magnesium 250 MG TABS Take 2 tablets (500 mg total) by mouth daily. 60 tablet 11  . NON FORMULARY CPAP at night    . Omega-3 Fatty Acids (FISH OIL) 1000 MG CAPS Take 1,000 mg by mouth at bedtime.    Marland Kitchen omeprazole (PRILOSEC) 20 MG capsule Take 20 mg by mouth 2 (two) times daily before a meal.    . simethicone (PHAZYME) 125 MG chewable tablet Chew 125-250 mg by mouth every 6 (six) hours as needed for flatulence.     .  Wheat Dextrin (BENEFIBER DRINK MIX PO) Take 1 Dose by mouth 3 (three) times daily.      No current facility-administered medications for this visit.     Allergies as of 06/15/2019 - Review Complete 06/15/2019  Allergen Reaction Noted  . Levofloxacin Other (See Comments) 03/03/2017  . Toradol [ketorolac tromethamine] Anaphylaxis 06/08/2019  . Penicillins Other (See Comments) 04/06/2012    Family History  Problem Relation Age of Onset  . Hypertension Father   . Diabetes Father   . Depression Other   . Uterine cancer Mother        ?cervical cancer  . Uterine cancer Sister        suicide  . Colon cancer Paternal Aunt 66  . Thyroid disease Neg Hx     Social History   Socioeconomic History  . Marital status: Married    Spouse name: Not on file  . Number of children: Not on file  . Years of education:  Not on file  . Highest education level: Not on file  Occupational History  . Not on file  Social Needs  . Financial resource strain: Not on file  . Food insecurity    Worry: Not on file    Inability: Not on file  . Transportation needs    Medical: Not on file    Non-medical: Not on file  Tobacco Use  . Smoking status: Former Smoker    Packs/day: 0.50    Years: 3.00    Pack years: 1.50    Types: Cigarettes    Quit date: 11/03/1997    Years since quitting: 21.6  . Smokeless tobacco: Never Used  . Tobacco comment: 1 pack 1 week  Substance and Sexual Activity  . Alcohol use: No    Alcohol/week: 0.0 standard drinks  . Drug use: No  . Sexual activity: Yes    Birth control/protection: Surgical  Lifestyle  . Physical activity    Days per week: Not on file    Minutes per session: Not on file  . Stress: Not on file  Relationships  . Social Herbalist on phone: Not on file    Gets together: Not on file    Attends religious service: Not on file    Active member of club or organization: Not on file    Attends meetings of clubs or organizations: Not on file     Relationship status: Not on file  Other Topics Concern  . Not on file  Social History Narrative  . Not on file    Review of Systems: General: Negative for anorexia, weight loss, fever, chills, fatigue, weakness. ENT: Negative for hoarseness, difficulty swallowing. CV: Negative for chest pain, angina, palpitations, peripheral edema.  Respiratory: Negative for dyspnea at rest, cough, sputum, wheezing.  GI: See history of present illness. Endo: Negative for unusual weight change.  Heme: Negative for bruising or bleeding. Allergy: Negative for rash or hives.  Physical Exam: Note: limited exam due to virtual visit General:   Alert and oriented. Pleasant and cooperative. Well-nourished and well-developed.  Head:  Normocephalic and atraumatic. Eyes:  Without icterus, sclera clear and conjunctiva pink.  Ears:  Normal auditory acuity. Skin:  Intact without facial significant lesions or rashes. Neurologic:  Alert and oriented x4;  grossly normal neurologically. Psych:  Alert and cooperative. Normal mood and affect. Heme/Lymph/Immune: No excessive bruising noted.

## 2019-06-21 ENCOUNTER — Telehealth: Payer: Self-pay | Admitting: *Deleted

## 2019-06-21 ENCOUNTER — Encounter: Payer: Self-pay | Admitting: *Deleted

## 2019-06-21 NOTE — Telephone Encounter (Signed)
EG ordered RUQ [place] from virtual visit 9/16.  [place] scheduled for 9/29 Tuesday at 7:30am, arrival 7:30am, npo midnight  Called patient and she is aware of appt details. She voiced understanding

## 2019-06-22 ENCOUNTER — Encounter: Payer: Self-pay | Admitting: Nurse Practitioner

## 2019-06-22 NOTE — Assessment & Plan Note (Signed)
The patient had an elevated lipase in the emergency department, per the patient.  We will recheck her lipase along with a CBC, CMP.  CT has been unremarkable.  EGD unremarkable.  Pending right upper quadrant ultrasound.  Has an appointment upcoming in October at [facility]  Follow-up in 3 months.

## 2019-06-22 NOTE — Patient Instructions (Signed)
Your health issues we discussed today were:   Constipation: 1. As we discussed, start taking Colace 100 mg over-the-counter stool softener once a day 2. Call us in 1 to 2 weeks and let us know if this is helping her constipation 3. Call us for any worsening or severe symptoms.  Abdominal pain: 1. Further management of your constipation as per above. 2. Continue your other current medications 3. We will schedule a right upper quadrant ultrasound to evaluate your bile ducts, although I doubt this is causing your pain 4. Have your labs checked as soon as you can 5. Keep your appointment with [facility] in October 6. Call us or proceed to the emergency room for any worsening or severe symptoms  Overall I recommend:  1. Continue with the current medications 2. Call us if you have any questions or concerns 3. Follow-up in 3 months.   Because of recent events of COVID-19 ("Coronavirus"), follow CDC recommendations:  Wash your hand frequently Avoid touching your face Stay away from people who are sick If you have symptoms such as fever, cough, shortness of breath then call your healthcare provider for further guidance If you are sick, STAY AT HOME unless otherwise directed by your healthcare provider. Follow directions from state and national officials regarding staying safe   At [facility] we value your feedback. You may receive a survey about your visit today. Please share your experience as we strive to create trusting relationships with our patients to provide genuine, compassionate, quality care.  We appreciate your understanding and patience as we review any laboratory studies, imaging, and other diagnostic tests that are ordered as we care for you. Our office policy is 5 business days for review of these results, and any emergent or urgent results are addressed in a timely manner for your best interest. If you do not hear from our  office in 1 week, please contact us.   We also encourage the use of MyChart, which contains your medical information for your review as well. If you are not enrolled in this feature, an access code is on this after visit summary for your convenience. Thank you for allowing [place] to be involved in your care.  It was great to see you today!  I hope you have a great Fall!!

## 2019-06-22 NOTE — Assessment & Plan Note (Signed)
The patient has a history of refractory GERD, abdominal pain, nausea.  She has had significant work-up including emergency room visits, EGD, CT of the abdomen and pelvis without explanation.  An additional CT was normal.  Primary care queries biliary stones status post cholecystectomy.  Dicyclomine initially trialed for postinfectious IBS which caused chest pain and had to go to the ER.  She is also having small, hard stools consistent with constipation although she avoid straining.  Known hemorrhoids as well.  At this point recommended requesting recent ER records and labs.  We will set her up for right upper quadrant ultrasound for better visualization of her common bile duct.  She has an appointment upcoming at [facility].  Further treatment of GERD and Colace as per below.  Labs including CBC, CMP, lipase.  Follow-up in 3 months to allow her time to be seen and worked up by [facility].

## 2019-06-22 NOTE — Assessment & Plan Note (Signed)
The patient describes constipation including bowel movement every other day with hard stools.  She tries to avoid straining given known hemorrhoids.  Denies hematochezia.  Constipation could absolutely be contributing to her abdominal pain.  This point I will have her start Colace 100 mg daily.  Request a progress report in 1 to 2 weeks at which point we can make further recommendations.  Appointment upcoming at [facility] as per below.  Follow-up in 3 months to allow appropriate work-up by [facility].

## 2019-06-27 ENCOUNTER — Other Ambulatory Visit: Payer: Self-pay

## 2019-06-28 ENCOUNTER — Ambulatory Visit (HOSPITAL_COMMUNITY)
Admission: RE | Admit: 2019-06-28 | Discharge: 2019-06-28 | Disposition: A | Discharge status: Home / Self Care | Payer: BC Managed Care – PPO | Source: Ambulatory Visit | Attending: Nurse Practitioner | Admitting: Nurse Practitioner

## 2019-06-28 DIAGNOSIS — R1010 Upper abdominal pain, unspecified: Secondary | ICD-10-CM | POA: Diagnosis not present

## 2019-06-28 DIAGNOSIS — K5900 Constipation, unspecified: Secondary | ICD-10-CM

## 2019-06-28 DIAGNOSIS — G4733 Obstructive sleep apnea (adult) (pediatric): Secondary | ICD-10-CM | POA: Diagnosis not present

## 2019-06-28 DIAGNOSIS — R1013 Epigastric pain: Secondary | ICD-10-CM

## 2019-06-28 DIAGNOSIS — R748 Abnormal levels of other serum enzymes: Secondary | ICD-10-CM

## 2019-06-28 IMAGING — US US ABDOMEN LIMITED
1 series · 14 of 25 positions shown · non-contrast
Comparison: [DATE].

CLINICAL DATA: Upper abdominal pain

EXAM:
ULTRASOUND ABDOMEN LIMITED RIGHT UPPER QUADRANT

[Series 1: us abdomen limited · 14 of 42 slices shown]
[im 1/42]
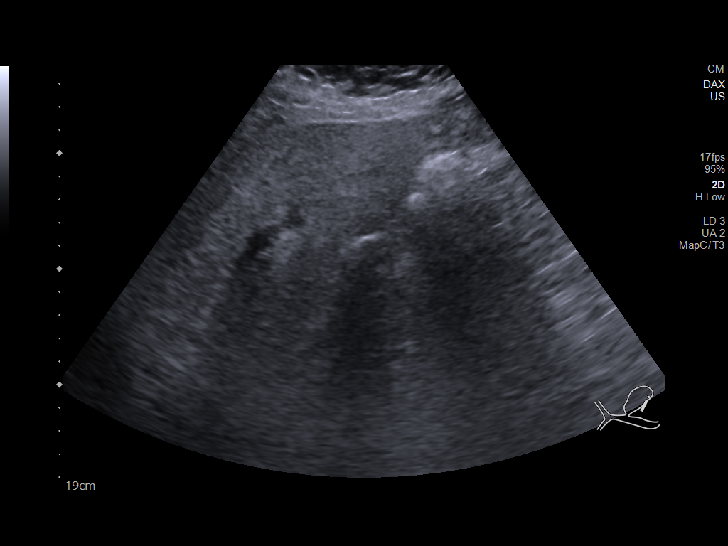
[im 4/42]
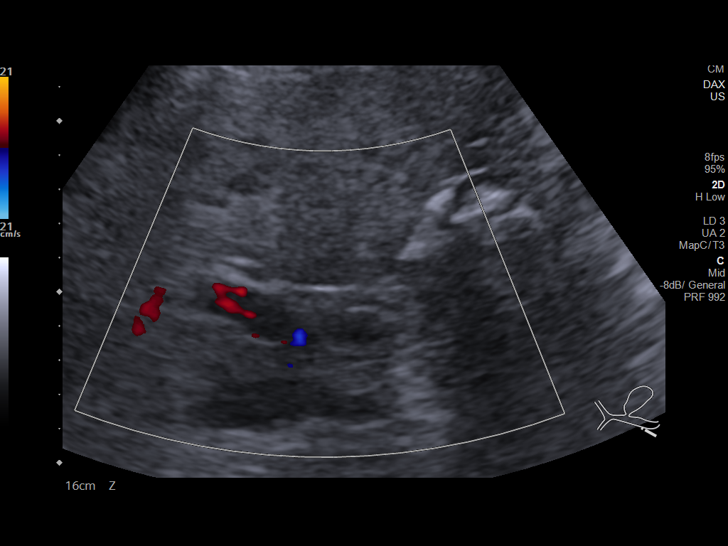
[im 7/42]
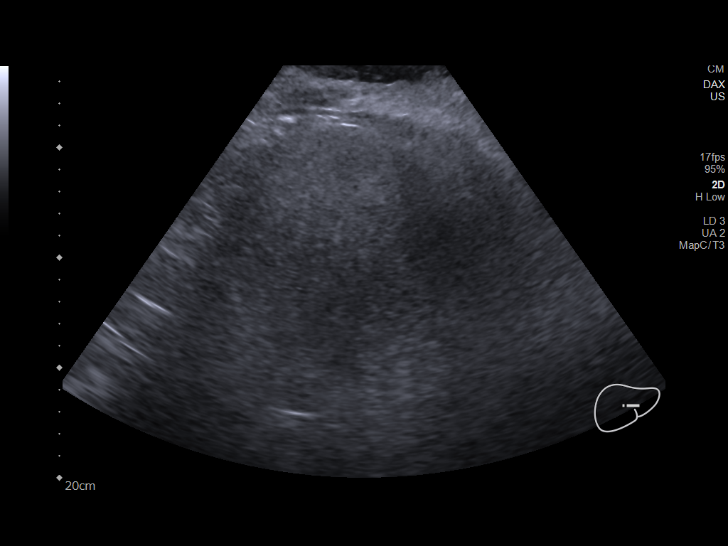
[im 11/42]
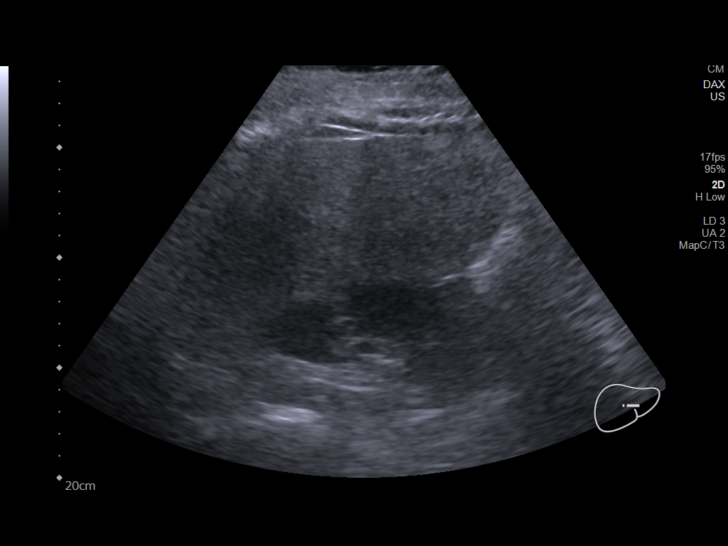
[im 14/42]
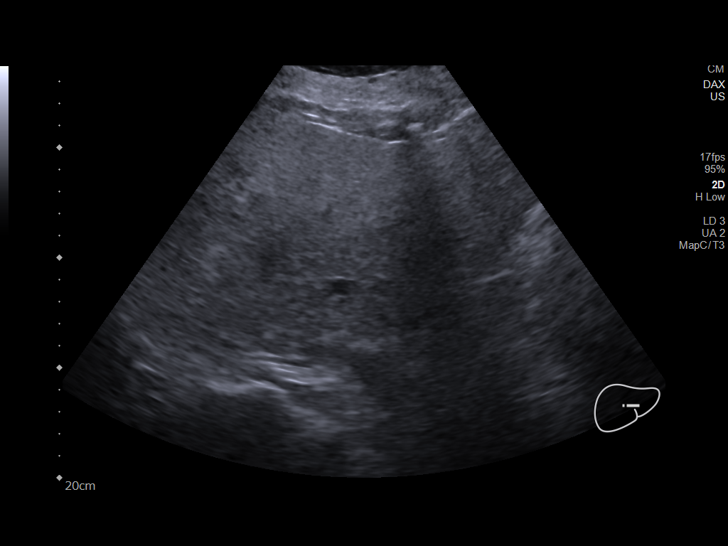
[im 16/42]
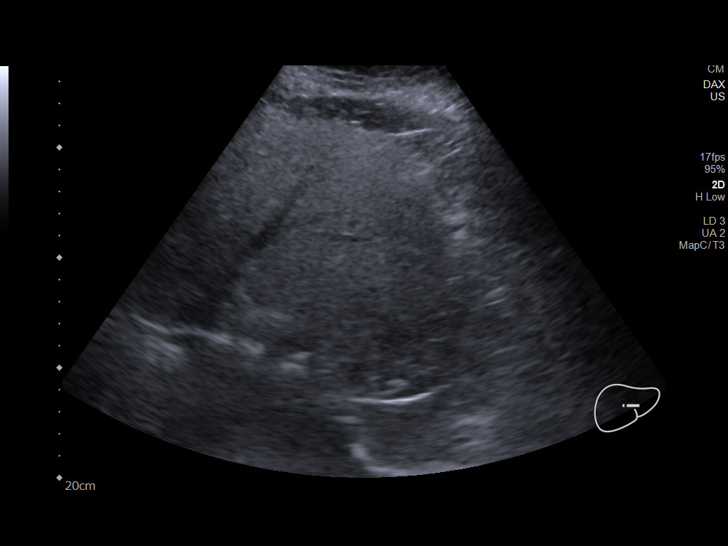
[im 19/42]
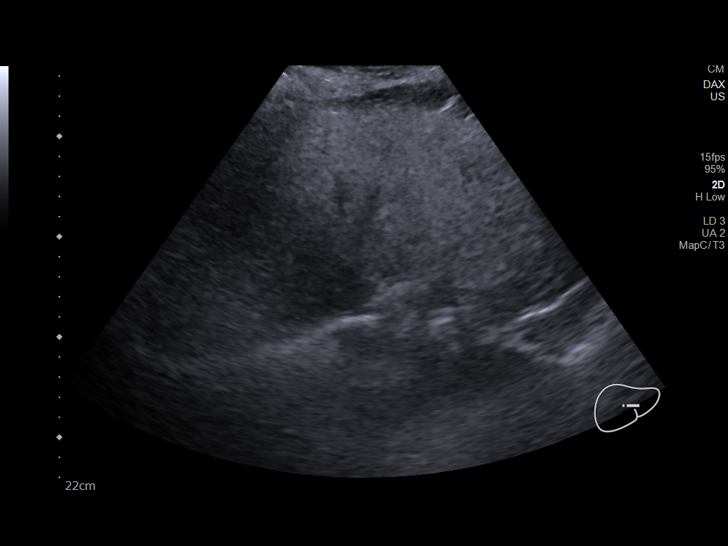
[im 23/42]
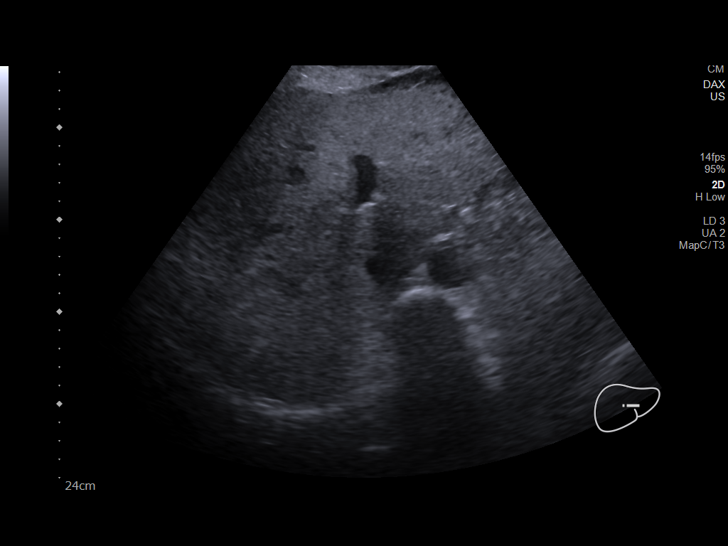
[im 26/42]
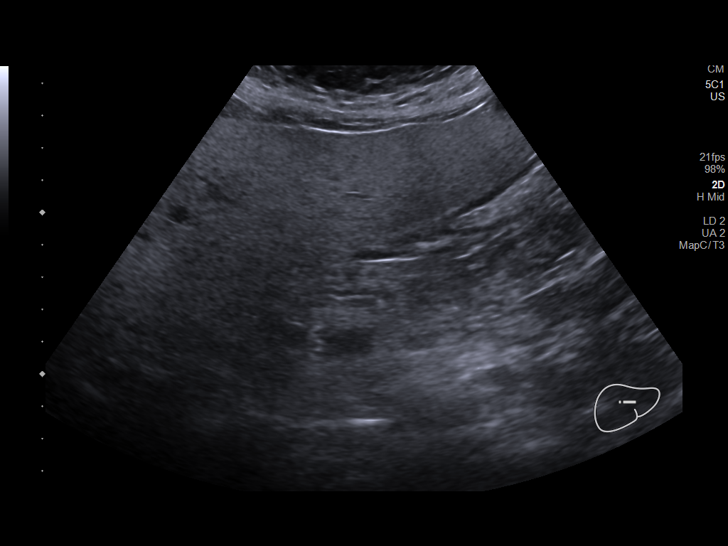
[im 28/42]
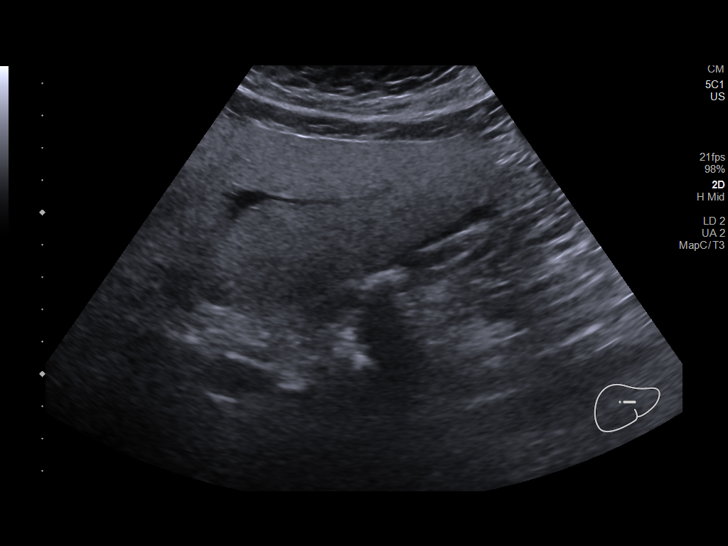
[im 31/42]
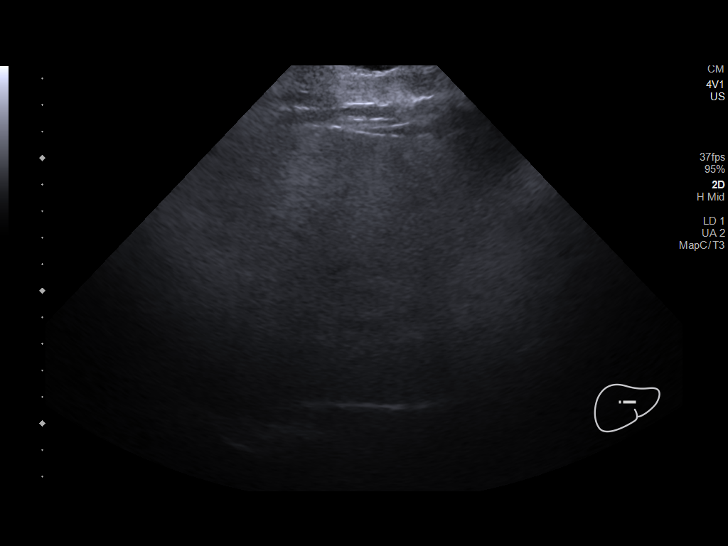
[im 35/42]
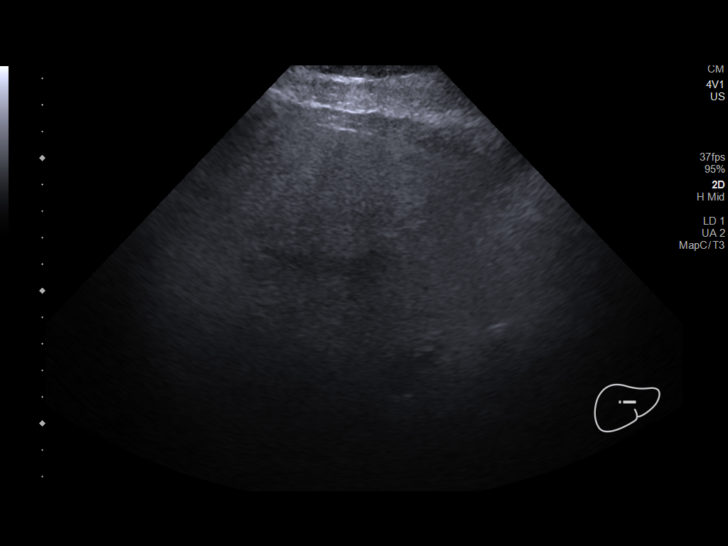
[im 38/42]
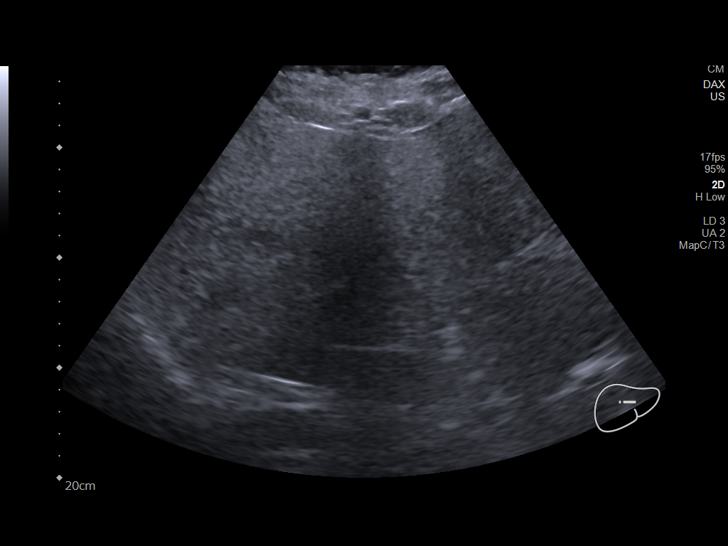
[im 42/42]
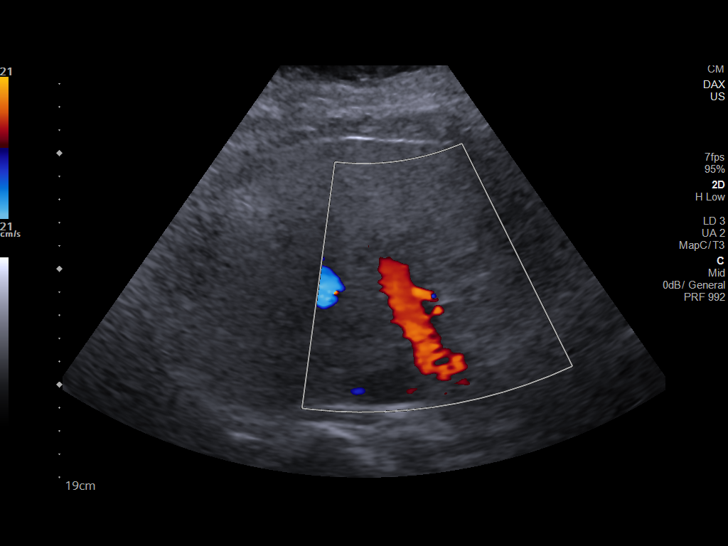

[14 of 25 positions shown; findings below may reference images not displayed]

FINDINGS: Gallbladder:

Surgically absent.

Common bile duct:

Diameter: 4 mm. No intrahepatic or extrahepatic biliary duct
dilatation.

Liver:

No focal lesion identified. Liver echogenicity is increased
diffusely period. Portal vein is patent on color Doppler imaging
with normal direction of blood flow towards the liver.

Other: None.
IMPRESSION: 1. Diffuse increase in liver echogenicity, a finding indicative of
hepatic steatosis. No focal liver lesions are demonstrable on this
study.

2.  Gallbladder absent.

## 2019-06-29 ENCOUNTER — Encounter: Payer: Self-pay | Admitting: Endocrinology

## 2019-06-29 ENCOUNTER — Other Ambulatory Visit: Payer: Self-pay

## 2019-06-29 ENCOUNTER — Ambulatory Visit (INDEPENDENT_AMBULATORY_CARE_PROVIDER_SITE_OTHER): Payer: BC Managed Care – PPO | Admitting: Endocrinology

## 2019-06-29 VITALS — BP 130/80 | HR 92 | Ht 64.0 in | Wt 258.0 lb

## 2019-06-29 DIAGNOSIS — E890 Postprocedural hypothyroidism: Secondary | ICD-10-CM | POA: Diagnosis not present

## 2019-06-29 DIAGNOSIS — E559 Vitamin D deficiency, unspecified: Secondary | ICD-10-CM

## 2019-06-29 DIAGNOSIS — R200 Anesthesia of skin: Secondary | ICD-10-CM | POA: Diagnosis not present

## 2019-06-29 LAB — BASIC METABOLIC PANEL
BUN: 7 mg/dL (ref 6–23)
CO2: 26 mEq/L (ref 19–32)
Calcium: 8.7 mg/dL (ref 8.4–10.5)
Chloride: 103 mEq/L (ref 96–112)
Creatinine, Ser: 0.69 mg/dL (ref 0.40–1.20)
GFR: 92.71 mL/min (ref >60.00)
Glucose, Bld: 115 mg/dL — ABNORMAL HIGH (ref 70–99)
Potassium: 3.8 mEq/L (ref 3.5–5.1)
Sodium: 137 mEq/L (ref 135–145)

## 2019-06-29 LAB — LIPID PANEL
Cholesterol: 219 mg/dL — ABNORMAL HIGH (ref 0–200)
HDL: 41.1 mg/dL (ref >39.00)
NonHDL: 178.04
Total CHOL/HDL Ratio: 5
Triglycerides: 344 mg/dL — ABNORMAL HIGH (ref 0.0–149.0)
VLDL: 68.8 mg/dL — ABNORMAL HIGH (ref 0.0–40.0)

## 2019-06-29 LAB — LDL CHOLESTEROL, DIRECT: Direct LDL: 135 mg/dL

## 2019-06-29 LAB — T4, FREE: Free T4: 0.82 ng/dL (ref 0.60–1.60)

## 2019-06-29 LAB — VITAMIN B12: Vitamin B-12: 260 pg/mL (ref 211–911)

## 2019-06-29 LAB — TSH: TSH: 2.29 u[IU]/mL (ref 0.35–4.50)

## 2019-06-29 LAB — MAGNESIUM: Magnesium: 1.8 mg/dL (ref 1.5–2.5)

## 2019-06-29 NOTE — Patient Instructions (Signed)
Blood tests are requested for you today.  We'll let you know about the results.  Please come back for a follow-up appointment in 2 months.   

## 2019-06-29 NOTE — Progress Notes (Signed)
Subjective:    Patient ID: Heather Fry, female    DOB: [birth year], 43 y.o.   MRN: [id number]  HPI Pt returns for f/u of postsurgical hypothyroidism (she had thyroidectomy 1/20, for hyperthyroidism; she was rx'ed synthroid soon thereafter).  She takes synthroid as rx'ed.   She also has mild postsurgical hypocalcemia (she took Tums 2-BID; in 1/20, she was seen in ER with facial tingling sxs after it was d/c'ed; she successfully stopped it 4/20).  She stopped taking vit-D, 1 week ago.   She also has hypomagnesemia: she takes 500 mg qd.   She has slight pain behind the eyes.  She has assoc numbness and pain of the hands.  she saw opthal, who said sxs were due to [place].   Past Medical History:  Diagnosis Date  . Anxiety   . Arthritis   . Depression   . Diverticulosis   . GERD (gastroesophageal reflux disease)   . Graves disease   . Internal hemorrhoids   . Thyroid disease   . Uterine fibroid     Past Surgical History:  Procedure Laterality Date  . ABDOMINAL HYSTERECTOMY     Per patient, uterine fibroids.  . ABDOMINAL HYSTERECTOMY    . CHOLECYSTECTOMY N/A 11/06/2014   Procedure: LAPAROSCOPIC CHOLECYSTECTOMY;  Surgeon: Jamesetta So, MD;  Location: [facility];  Service: General;  Laterality: N/A;  . COLONOSCOPY N/A 03/04/2017   Dr. Gala Romney: Hemorrhoids, diverticulosis, benign polyp without adenomatous changes.  Next colonoscopy 10 years  . DILATION AND CURETTAGE OF UTERUS    . ESOPHAGOGASTRODUODENOSCOPY N/A 10/02/2014   Dr. Gala Romney: fundic gland polyps, hiatal hernia  . MOUTH SURGERY     extraction of teeth  . POLYPECTOMY  03/04/2017   Procedure: POLYPECTOMY;  Surgeon: Daneil Dolin, MD;  Location: [facility];  Service: Endoscopy;;  colon  . THYROIDECTOMY N/A 09/30/2018   Procedure: TOTAL THYROIDECTOMY;  Surgeon: Armandina Gemma, MD;  Location: [facility];  Service: General;  Laterality: N/A;  . TUBAL LIGATION      Social History   Socioeconomic History  . Marital status: Married   Spouse name: Not on file  . Number of children: Not on file  . Years of education: Not on file  . Highest education level: Not on file  Occupational History  . Not on file  Social Needs  . Financial resource strain: Not on file  . Food insecurity    Worry: Not on file    Inability: Not on file  . Transportation needs    Medical: Not on file    Non-medical: Not on file  Tobacco Use  . Smoking status: Former Smoker    Packs/day: 0.50    Years: 3.00    Pack years: 1.50    Types: Cigarettes    Quit date: 11/03/1997    Years since quitting: 21.6  . Smokeless tobacco: Never Used  . Tobacco comment: 1 pack 1 week  Substance and Sexual Activity  . Alcohol use: No    Alcohol/week: 0.0 standard drinks  . Drug use: No  . Sexual activity: Yes    Birth control/protection: Surgical  Lifestyle  . Physical activity    Days per week: Not on file    Minutes per session: Not on file  . Stress: Not on file  Relationships  . Social Herbalist on phone: Not on file    Gets together: Not on file    Attends religious service: Not on file  Active member of club or organization: Not on file    Attends meetings of clubs or organizations: Not on file    Relationship status: Not on file  . Intimate partner violence    Fear of current or ex partner: Not on file    Emotionally abused: Not on file    Physically abused: Not on file    Forced sexual activity: Not on file  Other Topics Concern  . Not on file  Social History Narrative  . Not on file    Current Outpatient Medications on File Prior to Visit  Medication Sig Dispense Refill  . acetaminophen (TYLENOL) 500 MG tablet Take 1,000 mg by mouth daily as needed for moderate pain.    . Camphor-Eucalyptus-Menthol (VICKS VAPORUB EX) Apply 1 application topically daily as needed (congestion/sinus headaches).    . Cholecalciferol (VITAMIN D3) 125 MCG (5000 UT) CAPS Take 2 capsules (10,000 Units total) by mouth daily. 60 capsule 11  .  DM-APAP-CPM (CORICIDIN HBP PO) Take by mouth as needed.     Marland Kitchen escitalopram (LEXAPRO) 20 MG tablet Take 1 tablet (20 mg total) by mouth at bedtime. 30 tablet 5  . levothyroxine (SYNTHROID) 137 MCG tablet Take 1 tablet (137 mcg total) by mouth daily before breakfast. 90 tablet 3  . Magnesium 250 MG TABS Take 2 tablets (500 mg total) by mouth daily. 60 tablet 11  . NON FORMULARY CPAP at [place]    . Omega-3 Fatty Acids (FISH OIL) 1000 MG CAPS Take 1,000 mg by mouth at bedtime.    Marland Kitchen omeprazole (PRILOSEC) 20 MG capsule Take 20 mg by mouth 2 (two) times daily before a meal.    . simethicone (PHAZYME) 125 MG chewable tablet Chew 125-250 mg by mouth every 6 (six) hours as needed for flatulence.     . Wheat Dextrin (BENEFIBER DRINK MIX PO) Take 1 Dose by mouth 3 (three) times daily.      No current facility-administered medications on file prior to visit.     Allergies  Allergen Reactions  . Levofloxacin Other (See Comments)    Pt can not have due to taking Lexapro   . Toradol [Ketorolac Tromethamine] Anaphylaxis  . Penicillins Other (See Comments)    Loopy, childhood allergy DID THE REACTION INVOLVE: Swelling of the face/tongue/throat, SOB, or low BP? Unknown Sudden or severe rash/hives, skin peeling, or the inside of the mouth or nose? Unknown Did it require medical treatment? Yes When did it last happen?Childhood allergy If all above answers are "NO", may proceed with cephalosporin use.     Family History  Problem Relation Age of Onset  . Hypertension Father   . Diabetes Father   . Depression Other   . Uterine cancer Mother        ?cervical cancer  . Uterine cancer Sister        suicide  . Colon cancer Paternal Aunt 73  . Thyroid disease Neg Hx     BP 130/80 (BP Location: Left Arm, Patient Position: Sitting, Cuff Size: Large)   Pulse 92   Ht 5\' 4"  (1.626 m)   Wt 258 lb (117 kg)   LMP 10/09/2014   SpO2 98%   BMI 44.29 kg/m   Review of Systems Denies constip and  diarrhea.      Objective:   Physical Exam VITAL SIGNS:  See vs page GENERAL: no distress EYES: I cannot notice proptosis Neck: a healed scar is present.  I do not appreciate a nodule in the  thyroid or elsewhere in the neck.   Ext: no leg edema.   Lab Results  Component Value Date   PTH 24 06/29/2019   CALCIUM 8.9 06/29/2019   CALCIUM 8.7 06/29/2019   CAION 1.15 10/09/2018   PHOS 3.1 03/29/2019   Lab Results  Component Value Date   TSH 2.29 06/29/2019       Assessment & Plan:  CTS: check B-12.  I advised pt to f/u with primary care provider.  Hypocalcemia: well-controlled.  Please continue the same medication. Hypothyroidism: well-replaced.  Please continue the same medication  Patient Instructions  Blood tests are requested for you today.  We'll let you know about the results.  Please come back for a follow-up appointment in 2 months.

## 2019-06-30 LAB — PTH, INTACT AND CALCIUM
Calcium: 8.9 mg/dL (ref 8.6–10.2)
PTH: 24 pg/mL (ref 14–64)

## 2019-07-06 ENCOUNTER — Ambulatory Visit: Payer: BC Managed Care – PPO | Admitting: Endocrinology

## 2019-07-08 DIAGNOSIS — Z6841 Body Mass Index (BMI) 40.0 and over, adult: Secondary | ICD-10-CM | POA: Diagnosis not present

## 2019-07-08 DIAGNOSIS — Z0001 Encounter for general adult medical examination with abnormal findings: Secondary | ICD-10-CM | POA: Diagnosis not present

## 2019-07-08 DIAGNOSIS — E063 Autoimmune thyroiditis: Secondary | ICD-10-CM | POA: Diagnosis not present

## 2019-07-08 DIAGNOSIS — Z23 Encounter for immunization: Secondary | ICD-10-CM | POA: Diagnosis not present

## 2019-07-08 DIAGNOSIS — I10 Essential (primary) hypertension: Secondary | ICD-10-CM | POA: Diagnosis not present

## 2019-07-08 DIAGNOSIS — M6080 Other myositis, unspecified site: Secondary | ICD-10-CM | POA: Diagnosis not present

## 2019-07-16 DIAGNOSIS — F419 Anxiety disorder, unspecified: Secondary | ICD-10-CM | POA: Diagnosis not present

## 2019-07-16 DIAGNOSIS — Z881 Allergy status to other antibiotic agents status: Secondary | ICD-10-CM | POA: Diagnosis not present

## 2019-07-16 DIAGNOSIS — F410 Panic disorder [episodic paroxysmal anxiety] without agoraphobia: Secondary | ICD-10-CM | POA: Diagnosis not present

## 2019-07-16 DIAGNOSIS — M25512 Pain in left shoulder: Secondary | ICD-10-CM | POA: Diagnosis not present

## 2019-07-16 DIAGNOSIS — R079 Chest pain, unspecified: Secondary | ICD-10-CM | POA: Diagnosis not present

## 2019-07-16 DIAGNOSIS — K219 Gastro-esophageal reflux disease without esophagitis: Secondary | ICD-10-CM | POA: Diagnosis not present

## 2019-07-16 DIAGNOSIS — Z880 Allergy status to penicillin: Secondary | ICD-10-CM | POA: Diagnosis not present

## 2019-07-16 DIAGNOSIS — Z79899 Other long term (current) drug therapy: Secondary | ICD-10-CM | POA: Diagnosis not present

## 2019-07-16 DIAGNOSIS — R9431 Abnormal electrocardiogram [ECG] [EKG]: Secondary | ICD-10-CM | POA: Diagnosis not present

## 2019-07-16 DIAGNOSIS — Z885 Allergy status to narcotic agent status: Secondary | ICD-10-CM | POA: Diagnosis not present

## 2019-07-16 DIAGNOSIS — E0590 Thyrotoxicosis, unspecified without thyrotoxic crisis or storm: Secondary | ICD-10-CM | POA: Diagnosis not present

## 2019-07-16 DIAGNOSIS — Z87891 Personal history of nicotine dependence: Secondary | ICD-10-CM | POA: Diagnosis not present

## 2019-07-26 ENCOUNTER — Other Ambulatory Visit: Payer: Self-pay

## 2019-07-28 ENCOUNTER — Ambulatory Visit (INDEPENDENT_AMBULATORY_CARE_PROVIDER_SITE_OTHER): Payer: BC Managed Care – PPO | Admitting: Endocrinology

## 2019-07-28 ENCOUNTER — Encounter: Payer: Self-pay | Admitting: Endocrinology

## 2019-07-28 ENCOUNTER — Other Ambulatory Visit: Payer: Self-pay

## 2019-07-28 VITALS — BP 132/80 | HR 101 | Ht 64.0 in | Wt 256.0 lb

## 2019-07-28 DIAGNOSIS — E538 Deficiency of other specified B group vitamins: Secondary | ICD-10-CM | POA: Diagnosis not present

## 2019-07-28 DIAGNOSIS — E559 Vitamin D deficiency, unspecified: Secondary | ICD-10-CM

## 2019-07-28 DIAGNOSIS — E890 Postprocedural hypothyroidism: Secondary | ICD-10-CM | POA: Diagnosis not present

## 2019-07-28 DIAGNOSIS — G4733 Obstructive sleep apnea (adult) (pediatric): Secondary | ICD-10-CM | POA: Diagnosis not present

## 2019-07-28 LAB — BASIC METABOLIC PANEL
BUN: 11 mg/dL (ref 6–23)
CO2: 30 mEq/L (ref 19–32)
Calcium: 9.4 mg/dL (ref 8.4–10.5)
Chloride: 101 mEq/L (ref 96–112)
Creatinine, Ser: 0.76 mg/dL (ref 0.40–1.20)
GFR: 82.9 mL/min (ref >60.00)
Glucose, Bld: 103 mg/dL — ABNORMAL HIGH (ref 70–99)
Potassium: 3.9 mEq/L (ref 3.5–5.1)
Sodium: 139 mEq/L (ref 135–145)

## 2019-07-28 LAB — MAGNESIUM: Magnesium: 2 mg/dL (ref 1.5–2.5)

## 2019-07-28 NOTE — Progress Notes (Signed)
Subjective:    Patient ID: Heather Fry, female    DOB: 21-Oct-1975, 43 y.o.   MRN: [id number]  HPI Pt returns for f/u of postsurgical hypothyroidism (she had thyroidectomy 1/20, for hyperthyroidism; she was rx'ed synthroid soon thereafter).  She takes synthroid as rx'ed.   She also has mild postsurgical hypocalcemia (she took Tums 2-BID; in 1/20, she was seen in ER with facial tingling sxs after it was d/c'ed; she no longer takes Ca++, but she takes vit-D, 10,000 units/day).    She also has hypomagnesemia: she takes 500 mg qd.   She has moderate pain throughout the body, and assoc fatigue.   Past Medical History:  Diagnosis Date  . Anxiety   . Arthritis   . Depression   . Diverticulosis   . GERD (gastroesophageal reflux disease)   . Graves disease   . Internal hemorrhoids   . Thyroid disease   . Uterine fibroid     Past Surgical History:  Procedure Laterality Date  . ABDOMINAL HYSTERECTOMY     Per patient, uterine fibroids.  . ABDOMINAL HYSTERECTOMY    . CHOLECYSTECTOMY N/A 11/06/2014   Procedure: LAPAROSCOPIC CHOLECYSTECTOMY;  Surgeon: Jamesetta So, MD;  Location: [facility];  Service: General;  Laterality: N/A;  . COLONOSCOPY N/A 03/04/2017   Dr. Gala Romney: Hemorrhoids, diverticulosis, benign polyp without adenomatous changes.  Next colonoscopy 10 years  . DILATION AND CURETTAGE OF UTERUS    . ESOPHAGOGASTRODUODENOSCOPY N/A 10/02/2014   Dr. Gala Romney: fundic gland polyps, hiatal hernia  . MOUTH SURGERY     extraction of teeth  . POLYPECTOMY  03/04/2017   Procedure: POLYPECTOMY;  Surgeon: Daneil Dolin, MD;  [facility]: [facility];  Service: Endoscopy;;  colon  . THYROIDECTOMY N/A 09/30/2018   Procedure: TOTAL THYROIDECTOMY;  Surgeon: Armandina Gemma, MD;  Location: [facility];  Service: General;  Laterality: N/A;  . TUBAL LIGATION      Social History   Socioeconomic History  . Marital status: Married    Spouse name: Not on file  . Number of children: Not on file  . Years of  education: Not on file  . Highest education level: Not on file  Occupational History  . Not on file  Social Needs  . Financial resource strain: Not on file  . Food insecurity    Worry: Not on file    Inability: Not on file  . Transportation needs    Medical: Not on file    Non-medical: Not on file  Tobacco Use  . Smoking status: Former Smoker    Packs/day: 0.50    Years: 3.00    Pack years: 1.50    Types: Cigarettes    Quit date: 11/03/1997    Years since quitting: 21.7  . Smokeless tobacco: Never Used  . Tobacco comment: 1 pack 1 week  Substance and Sexual Activity  . Alcohol use: No    Alcohol/week: 0.0 standard drinks  . Drug use: No  . Sexual activity: Yes    Birth control/protection: Surgical  Lifestyle  . Physical activity    Days per week: Not on file    Minutes per session: Not on file  . Stress: Not on file  Relationships  . Social Herbalist on phone: Not on file    Gets together: Not on file    Attends religious service: Not on file    Active member of club or organization: Not on file    Attends meetings of clubs  or organizations: Not on file    Relationship status: Not on file  . Intimate partner violence    Fear of current or ex partner: Not on file    Emotionally abused: Not on file    Physically abused: Not on file    Forced sexual activity: Not on file  Other Topics Concern  . Not on file  Social History Narrative  . Not on file    Current Outpatient Medications on File Prior to Visit  Medication Sig Dispense Refill  . acetaminophen (TYLENOL) 500 MG tablet Take 1,000 mg by mouth daily as needed for moderate pain.    . Camphor-Eucalyptus-Menthol (VICKS VAPORUB EX) Apply 1 application topically daily as needed (congestion/sinus headaches).    . Cholecalciferol (VITAMIN D3) 125 MCG (5000 UT) CAPS Take 2 capsules (10,000 Units total) by mouth daily. 60 capsule 11  . DM-APAP-CPM (CORICIDIN HBP PO) Take by mouth as needed.     Marland Kitchen  escitalopram (LEXAPRO) 20 MG tablet Take 1 tablet (20 mg total) by mouth at bedtime. 30 tablet 5  . levothyroxine (SYNTHROID) 137 MCG tablet Take 1 tablet (137 mcg total) by mouth daily before breakfast. 90 tablet 3  . Magnesium 250 MG TABS Take 2 tablets (500 mg total) by mouth daily. 60 tablet 11  . NON FORMULARY CPAP at [facility]    . omeprazole (PRILOSEC) 20 MG capsule Take 20 mg by mouth 2 (two) times daily before a meal.    . simethicone (PHAZYME) 125 MG chewable tablet Chew 125-250 mg by mouth every 6 (six) hours as needed for flatulence.     . Wheat Dextrin (BENEFIBER DRINK MIX PO) Take 1 Dose by mouth 3 (three) times daily.     . Omega-3 Fatty Acids (FISH OIL) 1000 MG CAPS Take 1,000 mg by mouth at bedtime.     No current facility-administered medications on file prior to visit.     Allergies  Allergen Reactions  . Levofloxacin Other (See Comments)    Pt can not have due to taking Lexapro   . Toradol [Ketorolac Tromethamine] Anaphylaxis  . Penicillins Other (See Comments)    Loopy, childhood allergy DID THE REACTION INVOLVE: Swelling of the face/tongue/throat, SOB, or low BP? Unknown Sudden or severe rash/hives, skin peeling, or the inside of the mouth or nose? Unknown Did it require medical treatment? Yes When did it last happen?Childhood allergy If all above answers are "NO", may proceed with cephalosporin use.     Family History  Problem Relation Age of Onset  . Hypertension Father   . Diabetes Father   . Depression Other   . Uterine cancer Mother        ?cervical cancer  . Uterine cancer Sister        suicide  . Colon cancer Paternal Aunt 29  . Thyroid disease Neg Hx     BP 132/80 (BP Location: Left Arm, Patient Position: Sitting, Cuff Size: Large)   Pulse (!) 101   Ht 5\' 4"  (1.626 m)   Wt 256 lb (116.1 kg)   LMP 10/09/2014   SpO2 98%   BMI 43.94 kg/m    Review of Systems She also has intermitt sweating, nausea, and lightheadedness.      Objective:    Physical Exam VITAL SIGNS:  See vs page GENERAL: no distress Neck: a healed scar is present.  I do not appreciate a nodule in the thyroid or elsewhere in the neck.    Lab Results  Component Value Date  TSH 2.70 07/28/2019      Assessment & Plan:  Hypothyroidism: well-replaced Vit-D def: recheck today Hypomagnesemia: recheck today.   Patient Instructions  Blood tests are requested for you today.  We'll let you know about the results.  Please come back for a follow-up appointment in 2-3 months.

## 2019-07-28 NOTE — Patient Instructions (Addendum)
Blood tests are requested for you today.  We'll let you know about the results.   Please come back for a follow-up appointment in 2-3 months.   

## 2019-07-29 LAB — PTH, INTACT AND CALCIUM
Calcium: 9.3 mg/dL (ref 8.6–10.2)
PTH: 19 pg/mL (ref 14–64)

## 2019-07-29 LAB — T4, FREE: Free T4: 0.94 ng/dL (ref 0.60–1.60)

## 2019-07-29 LAB — VITAMIN D 25 HYDROXY (VIT D DEFICIENCY, FRACTURES): VITD: 52.54 ng/mL (ref 30.00–100.00)

## 2019-07-29 LAB — VITAMIN B12: Vitamin B-12: 723 pg/mL (ref 211–911)

## 2019-07-29 LAB — T3, FREE: T3, Free: 2.8 pg/mL (ref 2.3–4.2)

## 2019-07-29 LAB — TSH: TSH: 2.7 u[IU]/mL (ref 0.35–4.50)

## 2019-07-29 LAB — THYROID PEROXIDASE ANTIBODY: Thyroperoxidase Ab SerPl-aCnc: <1 IU/mL (ref <9)

## 2019-08-09 DIAGNOSIS — I10 Essential (primary) hypertension: Secondary | ICD-10-CM | POA: Diagnosis not present

## 2019-08-09 DIAGNOSIS — R531 Weakness: Secondary | ICD-10-CM | POA: Diagnosis not present

## 2019-08-09 DIAGNOSIS — Z79899 Other long term (current) drug therapy: Secondary | ICD-10-CM | POA: Diagnosis not present

## 2019-08-09 DIAGNOSIS — K219 Gastro-esophageal reflux disease without esophagitis: Secondary | ICD-10-CM | POA: Diagnosis not present

## 2019-08-09 DIAGNOSIS — Z886 Allergy status to analgesic agent status: Secondary | ICD-10-CM | POA: Diagnosis not present

## 2019-08-09 DIAGNOSIS — R110 Nausea: Secondary | ICD-10-CM | POA: Diagnosis not present

## 2019-08-09 DIAGNOSIS — Z880 Allergy status to penicillin: Secondary | ICD-10-CM | POA: Diagnosis not present

## 2019-08-09 DIAGNOSIS — Z881 Allergy status to other antibiotic agents status: Secondary | ICD-10-CM | POA: Diagnosis not present

## 2019-08-09 DIAGNOSIS — Z8249 Family history of ischemic heart disease and other diseases of the circulatory system: Secondary | ICD-10-CM | POA: Diagnosis not present

## 2019-08-09 DIAGNOSIS — R112 Nausea with vomiting, unspecified: Secondary | ICD-10-CM | POA: Diagnosis not present

## 2019-08-09 DIAGNOSIS — R1013 Epigastric pain: Secondary | ICD-10-CM | POA: Diagnosis not present

## 2019-08-09 DIAGNOSIS — E039 Hypothyroidism, unspecified: Secondary | ICD-10-CM | POA: Diagnosis not present

## 2019-08-09 DIAGNOSIS — Z87891 Personal history of nicotine dependence: Secondary | ICD-10-CM | POA: Diagnosis not present

## 2019-08-09 DIAGNOSIS — R197 Diarrhea, unspecified: Secondary | ICD-10-CM | POA: Diagnosis not present

## 2019-08-15 DIAGNOSIS — Z6841 Body Mass Index (BMI) 40.0 and over, adult: Secondary | ICD-10-CM | POA: Diagnosis not present

## 2019-08-15 DIAGNOSIS — R5383 Other fatigue: Secondary | ICD-10-CM | POA: Diagnosis not present

## 2019-08-15 DIAGNOSIS — I10 Essential (primary) hypertension: Secondary | ICD-10-CM | POA: Diagnosis not present

## 2019-08-15 DIAGNOSIS — E063 Autoimmune thyroiditis: Secondary | ICD-10-CM | POA: Diagnosis not present

## 2019-08-15 DIAGNOSIS — K219 Gastro-esophageal reflux disease without esophagitis: Secondary | ICD-10-CM | POA: Diagnosis not present

## 2019-08-15 DIAGNOSIS — R6889 Other general symptoms and signs: Secondary | ICD-10-CM | POA: Diagnosis not present

## 2019-08-15 DIAGNOSIS — E669 Obesity, unspecified: Secondary | ICD-10-CM | POA: Diagnosis not present

## 2019-08-18 ENCOUNTER — Ambulatory Visit (INDEPENDENT_AMBULATORY_CARE_PROVIDER_SITE_OTHER): Payer: BC Managed Care – PPO | Admitting: Cardiology

## 2019-08-18 ENCOUNTER — Other Ambulatory Visit: Payer: Self-pay

## 2019-08-18 ENCOUNTER — Encounter: Payer: Self-pay | Admitting: Cardiology

## 2019-08-18 VITALS — BP 144/98 | HR 88 | Ht 64.0 in | Wt 257.8 lb

## 2019-08-18 DIAGNOSIS — R079 Chest pain, unspecified: Secondary | ICD-10-CM

## 2019-08-18 DIAGNOSIS — R0600 Dyspnea, unspecified: Secondary | ICD-10-CM | POA: Diagnosis not present

## 2019-08-18 DIAGNOSIS — M791 Myalgia, unspecified site: Secondary | ICD-10-CM

## 2019-08-18 DIAGNOSIS — I10 Essential (primary) hypertension: Secondary | ICD-10-CM | POA: Diagnosis not present

## 2019-08-18 DIAGNOSIS — E785 Hyperlipidemia, unspecified: Secondary | ICD-10-CM

## 2019-08-18 DIAGNOSIS — R0609 Other forms of dyspnea: Secondary | ICD-10-CM

## 2019-08-18 NOTE — Progress Notes (Signed)
Cardiology Office Note:    Date:  08/18/2019   ID:  Heather Fry, DOB 12/11/1975, MRN [id number]  PCP:  Redmond School, MD  Cardiologist:  No primary care provider on file.  Electrophysiologist:  None   Referring MD: Redmond School, MD   Chief Complaint  Patient presents with  . Fatigue    History of Present Illness:    Heather Fry is a 43 y.o. female with a hx of Graves' disease status post thyroidectomy, hypertension, hyperlipidemia, OSA on CPAP who is referred by Dr. Gerarda Fraction for an evaluation of fatigue/dyspnea/chest pain.  She reports that she was started on pravastatin 3 weeks ago.  States that since that time she has been having muscle aches, fatigue, weakness, chest pain and shortness of breath.  She went to the ED in [place], work-up was unremarkable.  States she has felt like she did when her thyroid levels were abnormal, but had thyroid levels checked and was unremarkable.  States that she used to walk 3 miles per day, but now is short of breath with minimal exertion.  Also reports chest pain on the right side of chest, under the breast.  Pain occurs at rest, has not noted any relationship with exertion.  This also has started since she began pravastatin.  She has decreased her pravastatin dose from daily to once per week.  Quit smoking in September 2019.  No known family history heart disease.  Not on any blood pressure meds since started CPAP for her OSA.  At home her BP has been 110-120s/70-80s.  Past Medical History:  Diagnosis Date  . Anxiety   . Arthritis   . Depression   . Diverticulosis   . GERD (gastroesophageal reflux disease)   . Graves disease   . Internal hemorrhoids   . Thyroid disease   . Uterine fibroid     Past Surgical History:  Procedure Laterality Date  . ABDOMINAL HYSTERECTOMY     Per patient, uterine fibroids.  . ABDOMINAL HYSTERECTOMY    . CHOLECYSTECTOMY N/A 11/06/2014   Procedure: LAPAROSCOPIC CHOLECYSTECTOMY;  Surgeon: Jamesetta So,  MD;  Location: [facility];  Service: General;  Laterality: N/A;  . COLONOSCOPY N/A 03/04/2017   Dr. Gala Romney: Hemorrhoids, diverticulosis, benign polyp without adenomatous changes.  Next colonoscopy 10 years  . DILATION AND CURETTAGE OF UTERUS    . ESOPHAGOGASTRODUODENOSCOPY N/A 10/02/2014   Dr. Gala Romney: fundic gland polyps, hiatal hernia  . MOUTH SURGERY     extraction of teeth  . POLYPECTOMY  03/04/2017   Procedure: POLYPECTOMY;  Surgeon: Daneil Dolin, MD;  [facility];  Service: Endoscopy;;  colon  . THYROIDECTOMY N/A 09/30/2018   Procedure: TOTAL THYROIDECTOMY;  Surgeon: Armandina Gemma, MD;  Location: [facility];  Service: General;  Laterality: N/A;  . TUBAL LIGATION      Current Medications: Current Meds  Medication Sig  . acetaminophen (TYLENOL) 500 MG tablet Take 1,000 mg by mouth daily as needed for moderate pain.  . Camphor-Eucalyptus-Menthol (VICKS VAPORUB EX) Apply 1 application topically daily as needed (congestion/sinus headaches).  . Cholecalciferol (VITAMIN D3) 125 MCG (5000 UT) CAPS Take 2 capsules (10,000 Units total) by mouth daily.  Marland Kitchen escitalopram (LEXAPRO) 20 MG tablet Take 1 tablet (20 mg total) by mouth at bedtime.  Marland Kitchen levothyroxine (SYNTHROID) 137 MCG tablet Take 1 tablet (137 mcg total) by mouth daily before breakfast.  . Magnesium 250 MG TABS Take 1 tablet by mouth daily.  . NON FORMULARY CPAP at night  .  omeprazole (PRILOSEC) 20 MG capsule Take 20 mg by mouth 2 (two) times daily before a meal.  . simethicone (PHAZYME) 125 MG chewable tablet Chew 125-250 mg by mouth every 6 (six) hours as needed for flatulence.   . Wheat Dextrin (BENEFIBER DRINK MIX PO) Take 1 Dose by mouth 3 (three) times daily.   . [DISCONTINUED] pravastatin (PRAVACHOL) 20 MG tablet Take 20 mg by mouth daily.     Allergies:   Levofloxacin, Toradol [ketorolac tromethamine], and Penicillins   Social History   Socioeconomic History  . Marital status: Married    Spouse name: Not on file  . Number  of children: Not on file  . Years of education: Not on file  . Highest education level: Not on file  Occupational History  . Not on file  Social Needs  . Financial resource strain: Not on file  . Food insecurity    Worry: Not on file    Inability: Not on file  . Transportation needs    Medical: Not on file    Non-medical: Not on file  Tobacco Use  . Smoking status: Former Smoker    Packs/day: 0.50    Years: 3.00    Pack years: 1.50    Types: Cigarettes    Quit date: 11/03/1997    Years since quitting: 21.8  . Smokeless tobacco: Never Used  . Tobacco comment: 1 pack 1 week  Substance and Sexual Activity  . Alcohol use: No    Alcohol/week: 0.0 standard drinks  . Drug use: No  . Sexual activity: Yes    Birth control/protection: Surgical  Lifestyle  . Physical activity    Days per week: Not on file    Minutes per session: Not on file  . Stress: Not on file  Relationships  . Social Herbalist on phone: Not on file    Gets together: Not on file    Attends religious service: Not on file    Active member of club or organization: Not on file    Attends meetings of clubs or organizations: Not on file    Relationship status: Not on file  Other Topics Concern  . Not on file  Social History Narrative  . Not on file     Family History: The patient's family history includes Colon cancer (age of onset: 54) in her paternal aunt; Depression in an other family member; Diabetes in her father; Hypertension in her father; Uterine cancer in her mother and sister. There is no history of Thyroid disease.  ROS:   Please see the history of present illness.     All other systems reviewed and are negative.  EKGs/Labs/Other Studies Reviewed:    The following studies were reviewed today:   EKG:  EKG is ordered today.  The ekg ordered today demonstrates normal sinus rhythm, rate 88, no ST/T abnormalities  Recent Labs: 10/09/2018: ALT 21 03/29/2019: Hemoglobin 13.2; Platelets  251.0 07/28/2019: BUN 11; Creatinine, Ser 0.76; Magnesium 2.0; Potassium 3.9; Sodium 139; TSH 2.70  Recent Lipid Panel    Component Value Date/Time   CHOL 219 (H) 06/29/2019 1458   TRIG 344.0 (H) 06/29/2019 1458   HDL 41.10 06/29/2019 1458   CHOLHDL 5 06/29/2019 1458   VLDL 68.8 (H) 06/29/2019 1458   LDLDIRECT 135.0 06/29/2019 1458    Physical Exam:    VS:  BP (!) 144/98   Pulse 88   Ht 5\' 4"  (1.626 m)   Wt 257 lb 12.8 oz (116.9  kg)   LMP 10/09/2014   SpO2 98%   BMI 44.25 kg/m     Wt Readings from Last 3 Encounters:  08/18/19 257 lb 12.8 oz (116.9 kg)  07/28/19 256 lb (116.1 kg)  06/29/19 258 lb (117 kg)     GEN:  in no acute distress HEENT: Normal NECK: No JVD LYMPHATICS: No lymphadenopathy CARDIAC: RRR, no murmurs, rubs, gallops RESPIRATORY:  Clear to auscultation without rales, wheezing or rhonchi  ABDOMEN: Soft, non-tender, non-distended MUSCULOSKELETAL:  No edema; No deformity  SKIN: Warm and dry NEUROLOGIC:  Alert and oriented x 3 PSYCHIATRIC:  Normal affect   ASSESSMENT:    1. Chest pain of uncertain etiology   2. DOE (dyspnea on exertion)   3. Myalgia   4. Essential hypertension   5. Hyperlipidemia, unspecified hyperlipidemia type    PLAN:    In order of problems listed above:  Dyspnea/chest pain/fatigue/myalgias: Suspect related to statin use, as reports symptoms started when she began taking pravastatin 3 weeks ago.  Her 10-year ASCVD risk score is 1.7%, so she would be at low risk for an ASCVD event and can stop the statin at this time.  Will discontinue statin and follow-up in 1 month.  If continuing to have symptoms once off statin can consider further evaluation with echo and stress test  Hypertension: Reports has not required blood pressure medicine since she started CPAP for her OSA.  BP elevated in clinic today, but appears well controlled on checks at home.  Hyperlipidemia: On pravastatin 20 mg daily.  LDL 135 on 06/29/2019.  Discontinue  pravastatin as above  OSA: On CPAP  RTC in 1 month  Medication Adjustments/Labs and Tests Ordered: Current medicines are reviewed at length with the patient today.  Concerns regarding medicines are outlined above.  No orders of the defined types were placed in this encounter.  No orders of the defined types were placed in this encounter.   Patient Instructions  Medication Instructions:  Stop: Pravastatin 20 mg  *If you need a refill on your cardiac medications before your next appointment, please call your pharmacy*  Lab Work: None  Testing/Procedures: None  Follow-Up: At [facility], you and your health needs are our priority.  As part of our continuing mission to provide you with exceptional heart care, we have created designated Provider Care Teams.  These Care Teams include your primary Cardiologist (physician) and Advanced Practice Providers (APPs -  Physician Assistants and Nurse Practitioners) who all work together to provide you with the care you need, when you need it.  Your next appointment:   1 month(s)  The format for your next appointment:   In Person  Provider:   Oswaldo Milian, MD      Signed, Donato Heinz, MD  08/18/2019 4:27 PM    [place]

## 2019-08-18 NOTE — Patient Instructions (Signed)
Medication Instructions:  Stop: Pravastatin 20 mg  *If you need a refill on your cardiac medications before your next appointment, please call your pharmacy*  Lab Work: None  Testing/Procedures: None  Follow-Up: At [facility], you and your health needs are our priority.  As part of our continuing mission to provide you with exceptional heart care, we have created designated Provider Care Teams.  These Care Teams include your primary Cardiologist (physician) and Advanced Practice Providers (APPs -  Physician Assistants and Nurse Practitioners) who all work together to provide you with the care you need, when you need it.  Your next appointment:   1 month(s)  The format for your next appointment:   In Person  Provider:   Oswaldo Milian, MD

## 2019-08-24 NOTE — Addendum Note (Signed)
Addended by: Waylan Rocher on: 08/24/2019 11:42 AM   Modules accepted: Orders

## 2019-08-28 DIAGNOSIS — G4733 Obstructive sleep apnea (adult) (pediatric): Secondary | ICD-10-CM | POA: Diagnosis not present

## 2019-08-30 ENCOUNTER — Ambulatory Visit: Payer: BC Managed Care – PPO | Admitting: Endocrinology

## 2019-09-01 ENCOUNTER — Other Ambulatory Visit: Payer: Self-pay

## 2019-09-03 DIAGNOSIS — R1010 Upper abdominal pain, unspecified: Secondary | ICD-10-CM | POA: Diagnosis not present

## 2019-09-03 DIAGNOSIS — R200 Anesthesia of skin: Secondary | ICD-10-CM | POA: Diagnosis not present

## 2019-09-05 ENCOUNTER — Ambulatory Visit (INDEPENDENT_AMBULATORY_CARE_PROVIDER_SITE_OTHER): Payer: BC Managed Care – PPO | Admitting: Endocrinology

## 2019-09-05 ENCOUNTER — Other Ambulatory Visit: Payer: Self-pay

## 2019-09-05 ENCOUNTER — Encounter: Payer: Self-pay | Admitting: Endocrinology

## 2019-09-05 DIAGNOSIS — E890 Postprocedural hypothyroidism: Secondary | ICD-10-CM

## 2019-09-05 DIAGNOSIS — E538 Deficiency of other specified B group vitamins: Secondary | ICD-10-CM | POA: Diagnosis not present

## 2019-09-05 DIAGNOSIS — E559 Vitamin D deficiency, unspecified: Secondary | ICD-10-CM | POA: Diagnosis not present

## 2019-09-05 DIAGNOSIS — M791 Myalgia, unspecified site: Secondary | ICD-10-CM | POA: Diagnosis not present

## 2019-09-05 LAB — CK: Total CK: 63 U/L (ref 7–177)

## 2019-09-05 LAB — T4, FREE: Free T4: 0.76 ng/dL (ref 0.60–1.60)

## 2019-09-05 LAB — MAGNESIUM: Magnesium: 1.8 mg/dL (ref 1.5–2.5)

## 2019-09-05 LAB — TSH: TSH: 3.09 u[IU]/mL (ref 0.35–4.50)

## 2019-09-05 LAB — VITAMIN D 25 HYDROXY (VIT D DEFICIENCY, FRACTURES): VITD: 47.44 ng/mL (ref 30.00–100.00)

## 2019-09-05 LAB — SEDIMENTATION RATE: Sed Rate: 55 mm/hr — ABNORMAL HIGH (ref 0–20)

## 2019-09-05 LAB — T3, FREE: T3, Free: 3.1 pg/mL (ref 2.3–4.2)

## 2019-09-05 NOTE — Progress Notes (Signed)
Subjective:    Patient ID: Heather Fry, female    DOB: 10-09-1975, 43 y.o.   MRN: [id number]  HPI Pt returns for f/u of postsurgical hypothyroidism (she had thyroidectomy 1/20, for hyperthyroidism; she was rx'ed synthroid soon thereafter).  She takes synthroid as rx'ed.   She also has mild postsurgical hypocalcemia (she took Tums 2-BID; in 1/20, she was seen in ER with facial tingling sxs after it was d/c'ed; she no longer takes Ca++, but she still takes vit-D, 10,000 units/day).   She also has hypomagnesemia: she takes 250 mg qd.  She has stopped taking B-12.   She reports intermitt moderate pain at the right side of the head and neck, and assoc myalgias.  She stopped pravastatin.   Past Medical History:  Diagnosis Date  . Anxiety   . Arthritis   . Depression   . Diverticulosis   . GERD (gastroesophageal reflux disease)   . Graves disease   . Internal hemorrhoids   . Thyroid disease   . Uterine fibroid     Past Surgical History:  Procedure Laterality Date  . ABDOMINAL HYSTERECTOMY     Per patient, uterine fibroids.  . ABDOMINAL HYSTERECTOMY    . CHOLECYSTECTOMY N/A 11/06/2014   Procedure: LAPAROSCOPIC CHOLECYSTECTOMY;  Surgeon: Jamesetta So, MD;  Location: [facility];  Service: General;  Laterality: N/A;  . COLONOSCOPY N/A 03/04/2017   Dr. Gala Romney: Hemorrhoids, diverticulosis, benign polyp without adenomatous changes.  Next colonoscopy 10 years  . DILATION AND CURETTAGE OF UTERUS    . ESOPHAGOGASTRODUODENOSCOPY N/A 10/02/2014   Dr. Gala Romney: fundic gland polyps, hiatal hernia  . MOUTH SURGERY     extraction of teeth  . POLYPECTOMY  03/04/2017   Procedure: POLYPECTOMY;  Surgeon: Daneil Dolin, MD;  [facility];  Service: Endoscopy;;  colon  . THYROIDECTOMY N/A 09/30/2018   Procedure: TOTAL THYROIDECTOMY;  Surgeon: Armandina Gemma, MD;  Location: [facility];  Service: General;  Laterality: N/A;  . TUBAL LIGATION      Social History   Socioeconomic History  . Marital status:  Married    Spouse name: Not on file  . Number of children: Not on file  . Years of education: Not on file  . Highest education level: Not on file  Occupational History  . Not on file  Tobacco Use  . Smoking status: Former Smoker    Packs/day: 0.50    Years: 3.00    Pack years: 1.50    Types: Cigarettes    Quit date: 11/03/1997    Years since quitting: 21.8  . Smokeless tobacco: Never Used  . Tobacco comment: 1 pack 1 week  Substance and Sexual Activity  . Alcohol use: No    Alcohol/week: 0.0 standard drinks  . Drug use: No  . Sexual activity: Yes    Birth control/protection: Surgical  Other Topics Concern  . Not on file  Social History Narrative  . Not on file   Social Determinants of Health   Financial Resource Strain:   . Difficulty of Paying Living Expenses: Not on file  Food Insecurity:   . Worried About Charity fundraiser in the Last Year: Not on file  . Ran Out of Food in the Last Year: Not on file  Transportation Needs:   . Lack of Transportation (Medical): Not on file  . Lack of Transportation (Non-Medical): Not on file  Physical Activity:   . Days of Exercise per Week: Not on file  . Minutes  of Exercise per Session: Not on file  Stress:   . Feeling of Stress : Not on file  Social Connections:   . Frequency of Communication with Friends and Family: Not on file  . Frequency of Social Gatherings with Friends and Family: Not on file  . Attends Religious Services: Not on file  . Active Member of Clubs or Organizations: Not on file  . Attends Archivist Meetings: Not on file  . Marital Status: Not on file  Intimate Partner Violence:   . Fear of Current or Ex-Partner: Not on file  . Emotionally Abused: Not on file  . Physically Abused: Not on file  . Sexually Abused: Not on file    Current Outpatient Medications on File Prior to Visit  Medication Sig Dispense Refill  . acetaminophen (TYLENOL) 500 MG tablet Take 1,000 mg by mouth daily as needed  for moderate pain.    . Camphor-Eucalyptus-Menthol (VICKS VAPORUB EX) Apply 1 application topically daily as needed (congestion/sinus headaches).    . Cholecalciferol (VITAMIN D3) 125 MCG (5000 UT) CAPS Take 2 capsules (10,000 Units total) by mouth daily. 60 capsule 11  . escitalopram (LEXAPRO) 20 MG tablet Take 1 tablet (20 mg total) by mouth at bedtime. 30 tablet 5  . levothyroxine (SYNTHROID) 137 MCG tablet Take 1 tablet (137 mcg total) by mouth daily before breakfast. 90 tablet 3  . Magnesium 250 MG TABS Take 1 tablet by mouth daily.    . NON FORMULARY CPAP at [facility]    . omeprazole (PRILOSEC) 20 MG capsule Take 20 mg by mouth 2 (two) times daily before a meal.    . simethicone (PHAZYME) 125 MG chewable tablet Chew 125-250 mg by mouth every 6 (six) hours as needed for flatulence.     . Wheat Dextrin (BENEFIBER DRINK MIX PO) Take 1 Dose by mouth 3 (three) times daily.      No current facility-administered medications on file prior to visit.    Allergies  Allergen Reactions  . Levofloxacin Other (See Comments)    Pt can not have due to taking Lexapro   . Toradol [Ketorolac Tromethamine] Anaphylaxis  . Penicillins Other (See Comments)    Loopy, childhood allergy DID THE REACTION INVOLVE: Swelling of the face/tongue/throat, SOB, or low BP? Unknown Sudden or severe rash/hives, skin peeling, or the inside of the mouth or nose? Unknown Did it require medical treatment? Yes When did it last happen?Childhood allergy If all above answers are "NO", may proceed with cephalosporin use.     Family History  Problem Relation Age of Onset  . Hypertension Father   . Diabetes Father   . Depression Other   . Uterine cancer Mother        ?cervical cancer  . Uterine cancer Sister        suicide  . Colon cancer Paternal Aunt 65  . Thyroid disease Neg Hx     BP (!) 144/80 (BP Location: Left Wrist, Patient Position: Sitting, Cuff Size: Large)   Pulse 95   Ht 5\' 4"  (1.626 m)   Wt 266 lb  (120.7 kg)   LMP 10/09/2014   SpO2 100%   BMI 45.66 kg/m   Review of Systems She has fatigue, weight gain, and insomnia.      Objective:   Physical Exam VITAL SIGNS:  See vs page GENERAL: no distress NECK: supple.  Full ROM without pain Ext: no leg edema  Lab Results  Component Value Date   PTH 8 (L)  09/05/2019   CALCIUM 9.6 09/05/2019   CAION 1.15 10/09/2018   PHOS 3.1 03/29/2019   Lab Results  Component Value Date   TSH 3.09 09/05/2019   25-OH vit-D=47  Mg++=1.8    Assessment & Plan:  Hypoparathyroidism: well-controlled Neck pain, uncertain etiology.  No endocrine cause is found.  Hypothyroidism: well-replaced Vit-D def: well-replaced HTN: f/u with PCP Hypomagnesemia: well-replaced  Patient Instructions  Blood tests are requested for you today.  We'll let you know about the results.   Please come back for a follow-up appointment in 6 months.

## 2019-09-05 NOTE — Patient Instructions (Addendum)
Blood tests are requested for you today.  We'll let you know about the results.  Please come back for a follow-up appointment in 6 months.   

## 2019-09-06 ENCOUNTER — Telehealth: Payer: Self-pay

## 2019-09-06 LAB — PTH, INTACT AND CALCIUM
Calcium: 9.6 mg/dL (ref 8.6–10.2)
PTH: 8 pg/mL — ABNORMAL LOW (ref 14–64)

## 2019-09-06 NOTE — Telephone Encounter (Signed)
Referral order has been placed. Outgoing referral coordinator will take care of ensuring pt is scheduled with a provider.

## 2019-09-06 NOTE — Telephone Encounter (Signed)
done

## 2019-09-06 NOTE — Telephone Encounter (Signed)
Please place order for referral.  From My Chart lab note:  Hi Heather Fry:  Normal results, except for "sedimentation." This may mean nothing, but did you say you had seen a rheumatologist? If not, please let me know if you want to.

## 2019-09-06 NOTE — Telephone Encounter (Signed)
Patient called in stating that Dr left her a message in [place] saying she needed to see a rheumatologists and she does want to, needs this to be scheduled    Please advise

## 2019-09-14 DIAGNOSIS — R1010 Upper abdominal pain, unspecified: Secondary | ICD-10-CM | POA: Diagnosis not present

## 2019-09-14 DIAGNOSIS — R140 Abdominal distension (gaseous): Secondary | ICD-10-CM | POA: Diagnosis not present

## 2019-09-19 ENCOUNTER — Other Ambulatory Visit: Payer: Self-pay | Admitting: Endocrinology

## 2019-09-19 DIAGNOSIS — E0590 Thyrotoxicosis, unspecified without thyrotoxic crisis or storm: Secondary | ICD-10-CM

## 2019-09-27 ENCOUNTER — Emergency Department (HOSPITAL_COMMUNITY)
Admission: EM | Admit: 2019-09-27 | Discharge: 2019-09-28 | Disposition: A | Discharge status: Home / Self Care | Payer: BC Managed Care – PPO | Attending: Emergency Medicine | Admitting: Emergency Medicine

## 2019-09-27 ENCOUNTER — Other Ambulatory Visit: Payer: Self-pay

## 2019-09-27 ENCOUNTER — Telehealth: Payer: Self-pay

## 2019-09-27 ENCOUNTER — Emergency Department (HOSPITAL_COMMUNITY): Payer: BC Managed Care – PPO

## 2019-09-27 ENCOUNTER — Encounter (HOSPITAL_COMMUNITY): Payer: Self-pay | Admitting: Emergency Medicine

## 2019-09-27 DIAGNOSIS — R739 Hyperglycemia, unspecified: Secondary | ICD-10-CM | POA: Diagnosis not present

## 2019-09-27 DIAGNOSIS — R079 Chest pain, unspecified: Secondary | ICD-10-CM | POA: Insufficient documentation

## 2019-09-27 DIAGNOSIS — Z87891 Personal history of nicotine dependence: Secondary | ICD-10-CM | POA: Insufficient documentation

## 2019-09-27 DIAGNOSIS — Z79899 Other long term (current) drug therapy: Secondary | ICD-10-CM | POA: Insufficient documentation

## 2019-09-27 DIAGNOSIS — R7989 Other specified abnormal findings of blood chemistry: Secondary | ICD-10-CM | POA: Diagnosis not present

## 2019-09-27 DIAGNOSIS — R7309 Other abnormal glucose: Secondary | ICD-10-CM | POA: Insufficient documentation

## 2019-09-27 DIAGNOSIS — E039 Hypothyroidism, unspecified: Secondary | ICD-10-CM | POA: Diagnosis not present

## 2019-09-27 DIAGNOSIS — R0789 Other chest pain: Secondary | ICD-10-CM | POA: Diagnosis not present

## 2019-09-27 DIAGNOSIS — G4733 Obstructive sleep apnea (adult) (pediatric): Secondary | ICD-10-CM | POA: Diagnosis not present

## 2019-09-27 DIAGNOSIS — R946 Abnormal results of thyroid function studies: Secondary | ICD-10-CM | POA: Diagnosis not present

## 2019-09-27 LAB — CBC
HCT: 39.6 % (ref 36.0–46.0)
Hemoglobin: 13 g/dL (ref 12.0–15.0)
MCH: 29.9 pg (ref 26.0–34.0)
MCHC: 32.8 g/dL (ref 30.0–36.0)
MCV: 91 fL (ref 80.0–100.0)
Platelets: 275 10*3/uL (ref 150–400)
RBC: 4.35 MIL/uL (ref 3.87–5.11)
RDW: 14.2 % (ref 11.5–15.5)
WBC: 8.4 10*3/uL (ref 4.0–10.5)
nRBC: 0 % (ref 0.0–0.2)

## 2019-09-27 LAB — BASIC METABOLIC PANEL
Anion gap: 11 (ref 5–15)
BUN: 8 mg/dL (ref 6–20)
CO2: 24 mmol/L (ref 22–32)
Calcium: 9.2 mg/dL (ref 8.9–10.3)
Chloride: 104 mmol/L (ref 98–111)
Creatinine, Ser: 0.75 mg/dL (ref 0.44–1.00)
GFR calc Af Amer: >60 mL/min (ref >60)
GFR calc non Af Amer: >60 mL/min (ref >60)
Glucose, Bld: 163 mg/dL — ABNORMAL HIGH (ref 70–99)
Potassium: 4 mmol/L (ref 3.5–5.1)
Sodium: 139 mmol/L (ref 135–145)

## 2019-09-27 LAB — TSH: TSH: 5.067 u[IU]/mL — ABNORMAL HIGH (ref 0.350–4.500)

## 2019-09-27 LAB — TROPONIN I (HIGH SENSITIVITY): Troponin I (High Sensitivity): 2 ng/L (ref <18)

## 2019-09-27 NOTE — Telephone Encounter (Signed)
OK, I think all that needs to be done is to advise [facility]

## 2019-09-27 NOTE — Telephone Encounter (Signed)
Patient called in wanting to know if Dr would put a referral in for [facility] because the place she was referred to the first time can not get her in until Feb they have openings and can get her scheduled now

## 2019-09-27 NOTE — ED Triage Notes (Signed)
Patient states that she is having chest pain/tightness and also having shortness of breath that started today. Patient also states that she has no thyroid and had a high sed rate 2 weeks ago. Patient to follow up with cardiologist on Jan 6th for stress test and echocardiogram.

## 2019-09-28 ENCOUNTER — Telehealth: Payer: Self-pay

## 2019-09-28 DIAGNOSIS — M791 Myalgia, unspecified site: Secondary | ICD-10-CM

## 2019-09-28 DIAGNOSIS — R079 Chest pain, unspecified: Secondary | ICD-10-CM | POA: Diagnosis not present

## 2019-09-28 LAB — TROPONIN I (HIGH SENSITIVITY): Troponin I (High Sensitivity): 3 ng/L (ref <18)

## 2019-09-28 IMAGING — DX DG CHEST 1V PORT
1 series · 1 of 1 positions shown · non-contrast
Comparison: [DATE]

CLINICAL DATA: Chest pain and tightness

EXAM:
PORTABLE CHEST 1 VIEW

[chest ap grid]
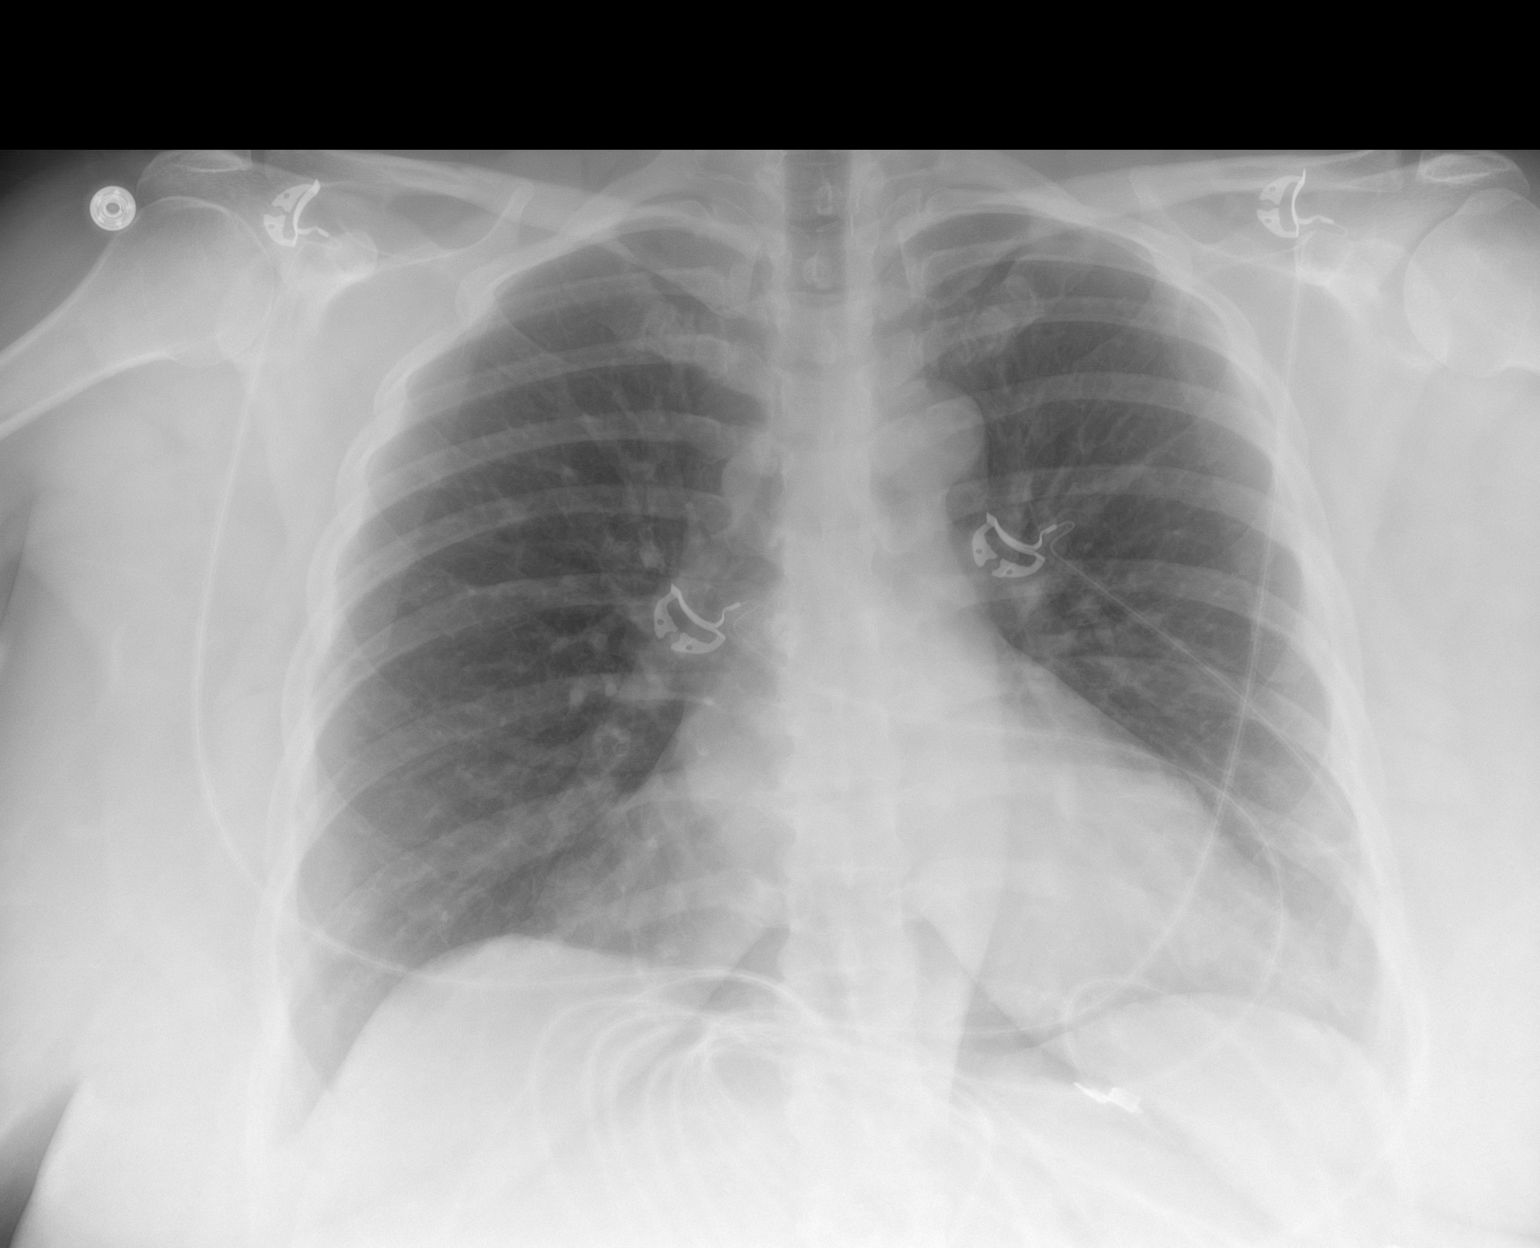

[1 of 1 positions shown; findings below may reference images not displayed]

FINDINGS: Patchy atelectasis at the left lung base. No pleural effusion.
Stable cardiomediastinal silhouette. No pneumothorax.
IMPRESSION: No active disease.  Patchy atelectasis at the left base.

## 2019-09-28 MED ORDER — LEVOTHYROXINE SODIUM 150 MCG PO TABS: 150.0000 ug | ORAL_TABLET | Freq: Every day | ORAL | 3 refills | Status: DC

## 2019-09-28 NOTE — Telephone Encounter (Signed)
Patient called in stating that she went to ER last night and they checked her TSH and her numbers were off and they think as well as her needs her medication adjusted for her thyroids...    Please call and advise

## 2019-09-28 NOTE — Telephone Encounter (Signed)
OK, I have sent a prescription to your pharmacy.  

## 2019-09-28 NOTE — Telephone Encounter (Signed)
Any rheumatol is OK with me

## 2019-09-28 NOTE — Discharge Instructions (Signed)
Your evaluation today did not show any signs of heart disease.  Please keep your follow-up appointment with the cardiologist.  Your blood tests did show a slightly elevated TSH and glucose.  Please discuss these with your endocrinologist.  Please follow-up with the rheumatologist for evaluation of your joint swelling and aching.  Return to the emergency department if you have any problems or concerns.  Nonsteroidal anti-inflammatory drugs such as ibuprofen and naproxen may be helpful for your joint complaints, but do carry an increased risk of bleeding because you are taking escitalopram.  Please discuss with the provider who prescribed that whether you can safely take anti-inflammatory medications.

## 2019-09-28 NOTE — ED Provider Notes (Signed)
[facility] EMERGENCY DEPARTMENT Provider Note   CSN: [id number] Arrival date & time: 09/27/19  2209   History Chief Complaint  Patient presents with  . Chest Pain    Heather Fry is a 43 y.o. female.  The history is provided by the patient.  Chest Pain She has history of Graves' disease, GERD and comes in complaining of episodes of chest pressure through the day today.  Pressure is in the lower chest and there is associated dyspnea, nausea, diaphoresis.  Episodes last about 30 minutes and resolved.  They are not related to exertion.  Nothing makes it better, nothing makes them worse.  She had seen a cardiologist last month and is scheduled for follow-up appointment next month.  That was for similar symptoms.  She is also scheduled to see a rheumatologist because of problems with joint swelling over the last 1-2 months.  Sedimentation rate earlier this month was elevated.  She is currently a non-smoker without history of hypertension, diabetes, hyperlipidemia.  Past Medical History:  Diagnosis Date  . Anxiety   . Arthritis   . Depression   . Diverticulosis   . GERD (gastroesophageal reflux disease)   . Graves disease   . Internal hemorrhoids   . Thyroid disease   . Uterine fibroid     Patient Active Problem List   Diagnosis Date Noted  . Myalgia 09/05/2019  . Vitamin B 12 deficiency 07/28/2019  . Numbness 06/29/2019  . Constipation 06/15/2019  . Elevated lipase 06/15/2019  . Hypomagnesemia 03/29/2019  . Leukocytosis 03/29/2019  . Abdominal pain 02/10/2019  . Nausea without vomiting 02/10/2019  . Tingling of face 11/30/2018  . Flushing 11/30/2018  . Vitamin D deficiency 10/06/2018  . GERD (gastroesophageal reflux disease) 01/28/2017  . Rectal bleeding 01/28/2017  . 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 Hypothyroidism 04/14/2016  . Gastric polyp   . RUQ abdominal pain 09/06/2014  . Excessive or frequent menstruation 01/13/2013  . Depression     Past Surgical  History:  Procedure Laterality Date  . ABDOMINAL HYSTERECTOMY     Per patient, uterine fibroids.  . ABDOMINAL HYSTERECTOMY    . CHOLECYSTECTOMY N/A 11/06/2014   Procedure: LAPAROSCOPIC CHOLECYSTECTOMY;  Surgeon: Jamesetta So, MD;  Location: [facility];  Service: General;  Laterality: N/A;  . COLONOSCOPY N/A 03/04/2017   Dr. Gala Romney: Hemorrhoids, diverticulosis, benign polyp without adenomatous changes.  Next colonoscopy 10 years  . DILATION AND CURETTAGE OF UTERUS    . ESOPHAGOGASTRODUODENOSCOPY N/A 10/02/2014   Dr. Gala Romney: fundic gland polyps, hiatal hernia  . MOUTH SURGERY     extraction of teeth  . POLYPECTOMY  03/04/2017   Procedure: POLYPECTOMY;  Surgeon: Daneil Dolin, MD;  Location: [facility];  Service: Endoscopy;;  colon  . THYROIDECTOMY N/A 09/30/2018   Procedure: TOTAL THYROIDECTOMY;  Surgeon: Armandina Gemma, MD;  Location: [facility];  Service: General;  Laterality: N/A;  . TUBAL LIGATION       OB History    Gravida  2   Para  2   Term  2   Preterm      AB      Living  2     SAB      TAB      Ectopic      Multiple      Live Births              Family History  Problem Relation Age of Onset  . Hypertension Father   . Diabetes  Father   . Depression Other   . Uterine cancer Mother        ?cervical cancer  . Uterine cancer Sister        suicide  . Colon cancer Paternal Aunt 81  . Thyroid disease Neg Hx     Social History   Tobacco Use  . Smoking status: Former Smoker    Packs/day: 0.50    Years: 3.00    Pack years: 1.50    Types: Cigarettes    Quit date: 11/03/1997    Years since quitting: 21.9  . Smokeless tobacco: Never Used  . Tobacco comment: 1 pack 1 week  Substance Use Topics  . Alcohol use: No    Alcohol/week: 0.0 standard drinks  . Drug use: No    Home Medications Prior to Admission medications   Medication Sig Start Date End Date Taking? Authorizing Provider  acetaminophen (TYLENOL) 500 MG tablet Take 1,000 mg by mouth daily as needed  for moderate pain.    [provider]  Camphor-Eucalyptus-Menthol (VICKS VAPORUB EX) Apply 1 application topically daily as needed (congestion/sinus headaches).    [provider]  Cholecalciferol (VITAMIN D3) 125 MCG (5000 UT) CAPS Take 2 capsules (10,000 Units total) by mouth daily. 06/08/19   Renato Shin, MD  escitalopram (LEXAPRO) 20 MG tablet Take 1 tablet (20 mg total) by mouth at bedtime. 01/26/19   Renato Shin, MD  levothyroxine (SYNTHROID) 137 MCG tablet Take 1 tablet (137 mcg total) by mouth daily before breakfast. 06/08/19   Renato Shin, MD  Magnesium 250 MG TABS Take 1 tablet by mouth daily.    [provider]  NON FORMULARY CPAP at night    [provider]  omeprazole (PRILOSEC) 20 MG capsule Take 20 mg by mouth 2 (two) times daily before a meal.    [provider]  simethicone (PHAZYME) 125 MG chewable tablet Chew 125-250 mg by mouth every 6 (six) hours as needed for flatulence.     [provider]  Wheat Dextrin (BENEFIBER DRINK MIX PO) Take 1 Dose by mouth 3 (three) times daily.     [provider]    Allergies    Levofloxacin, Toradol [ketorolac tromethamine], and Penicillins  Review of Systems   Review of Systems  Cardiovascular: Positive for chest pain.  All other systems reviewed and are negative.   Physical Exam Updated Vital Signs BP (!) 160/98 (BP Location: Right Arm)   Pulse 83   Temp 98.8 F (37.1 C) (Oral)   Resp 16   Ht 5\' 4"  (1.626 m)   Wt 121.6 kg   LMP 10/09/2014   SpO2 99%   BMI 46.00 kg/m   Physical Exam Vitals and nursing note reviewed.   43 year old female, resting comfortably and in no acute distress. Vital signs are significant for elevated blood pressure. Oxygen saturation is 99%, which is normal. Head is normocephalic and atraumatic. PERRLA, EOMI. Oropharynx is clear. Neck is nontender and supple without adenopathy or JVD. Back is nontender and there is no CVA  tenderness. Lungs are clear without rales, wheezes, or rhonchi. Chest is nontender. Heart has regular rate and rhythm without murmur. Abdomen is soft, flat, nontender without masses or hepatosplenomegaly and peristalsis is normoactive. Extremities have no cyanosis or edema, full range of motion is present. Skin is warm and dry without rash. Neurologic: Mental status is normal, cranial nerves are intact, there are no motor or sensory deficits.  ED Results / Procedures / Treatments  Labs (all labs ordered are listed, but only abnormal results are displayed) Labs Reviewed  TSH - Abnormal; Notable for the following components:      Result Value   TSH 5.067 (*)    All other components within normal limits  BASIC METABOLIC PANEL - Abnormal; Notable for the following components:   Glucose, Bld 163 (*)    All other components within normal limits  CBC  TROPONIN I (HIGH SENSITIVITY)    EKG EKG Interpretation  Date/Time:  Wednesday September 28 2019 00:01:30 EST Ventricular Rate:  86 PR Interval:    QRS Duration: 74 QT Interval:  384 QTC Calculation: 460 R Axis:   48 Text Interpretation: Sinus rhythm Low voltage, precordial leads When compared with ECG of 01/31/2019, No significant change was found Confirmed by Delora Fuel ([id number]) on 09/28/2019 12:03:59 AM   Radiology No results found.  Procedures Procedures   Medications Ordered in ED Medications - No data to display  ED Course  I have reviewed the triage vital signs and the nursing notes.  Pertinent labs & imaging results that were available during my care of the patient were reviewed by me and considered in my medical decision making (see chart for details).  MDM Rules/Calculators/A&P Chest pain of uncertain cause.  Patient is actually low risk for coronary disease.  Heart score is 1 which puts her at low risk for major adverse cardiac events in the next 6 weeks.  ECG is unchanged from baseline.  Initial troponin is  normal.  CBC is normal.  Metabolic panel significant for glucose 163.  TSH is elevated but only mildly so.  Chest x-ray is unremarkable.  Will hold in the ED for delta troponin.  She already has appropriate cardiology follow-up and will also need rheumatology evaluation.  Will refer back to her endocrinologist to adjust her thyroid replacement dose.  Old records are reviewed confirming cardiology evaluation last month with the feeling that it was low risk.  Repeat troponin is normal.  Patient is advised of elevated glucose and TSH.  She is referred back to her appropriate specialists for follow-up.  Return precautions discussed.  Final Clinical Impression(s) / ED Diagnoses Final diagnoses:  Nonspecific chest pain  Elevated random blood glucose level  Elevated TSH    Rx / DC Orders ED Discharge Orders    None       Delora Fuel, [place] [id number] [phone]

## 2019-09-28 NOTE — Telephone Encounter (Signed)
Called pt and gave her MD Message. Pt verbalized understanding.   Pt also wanted to know the status of referral to a different Rheumatologist. [facility] cannot see pt until March. She would like a rheumatologist at [facility] if you will place the referral.

## 2019-09-28 NOTE — ED Notes (Signed)
ekg given to Dr. Roxanne Mins

## 2019-09-29 NOTE — Telephone Encounter (Signed)
Pt stated that she cannot see someone at [facility] because a referral was not sent there.

## 2019-09-29 NOTE — Addendum Note (Signed)
Addended by: Renato Shin on: 09/29/2019 02:13 PM   Modules accepted: Orders

## 2019-09-29 NOTE — Telephone Encounter (Signed)
I did another referral  

## 2019-09-29 NOTE — Telephone Encounter (Signed)
Resent to [facility]

## 2019-10-03 NOTE — Telephone Encounter (Signed)
Please refer to Dr. Ellison's response 

## 2019-10-04 ENCOUNTER — Encounter: Payer: Self-pay | Admitting: Endocrinology

## 2019-10-04 ENCOUNTER — Other Ambulatory Visit: Payer: Self-pay

## 2019-10-04 ENCOUNTER — Ambulatory Visit (INDEPENDENT_AMBULATORY_CARE_PROVIDER_SITE_OTHER): Payer: BC Managed Care – PPO | Admitting: Endocrinology

## 2019-10-04 VITALS — BP 120/80 | HR 98 | Ht 64.0 in | Wt 264.6 lb

## 2019-10-04 DIAGNOSIS — E890 Postprocedural hypothyroidism: Secondary | ICD-10-CM | POA: Diagnosis not present

## 2019-10-04 DIAGNOSIS — R739 Hyperglycemia, unspecified: Secondary | ICD-10-CM | POA: Diagnosis not present

## 2019-10-04 MED ORDER — SYNTHROID 150 MCG PO TABS: 150.0000 ug | ORAL_TABLET | Freq: Every day | ORAL | 3 refills | Status: DC

## 2019-10-04 NOTE — Progress Notes (Deleted)
Cardiology Office Note:    Date:  10/04/2019   ID:  Heather Fry, DOB Dec 26, 1975, MRN [id number]  PCP:  Redmond School, MD  Cardiologist:  No primary care provider on file.  Electrophysiologist:  None   Referring MD: Redmond School, MD   No chief complaint on file.   History of Present Illness:    Heather Fry is a 44 y.o. female with a hx of Graves' disease status post thyroidectomy, hypertension, hyperlipidemia, OSA on CPAP who presents for follow-up.  She was initially seen on the 08/18/19 for fatigue/dyspnea/chest pain.  She reports that she had been started on pravastatin 3 weeks prior.  States that since that time she had been having muscle aches, fatigue, weakness, chest pain and shortness of breath.  She went to the ED in [place], work-up was unremarkable.  States she has felt like she did when her thyroid levels were abnormal, but had thyroid levels checked and was unremarkable.  States that she used to walk 3 miles per day, but now is short of breath with minimal exertion.  Also reports chest pain on the right side of chest, under the breast.  Pain occurs at rest, has not noted any relationship with exertion.  This also has started since she began pravastatin.  She has decreased her pravastatin dose from daily to once per week.bbQuit smoking in September 2019.  No known family history heart disease.  Not on any blood pressure meds since started CPAP for her OSA.    Given her 10-year ASCVD risk score 1.7% in symptoms with starting statin, recommended that statin be discontinued.  Plan was to reevaluate in 1 month, and if symptoms continue rosuvastatin, then plan further evaluation with echo and stress test.  She presented to the ED on 09/28/2019 with chest pain.  Reported pressure in lower chest that lasted about 30 minutes and resolved.  High-sensitivity troponins negative x2.  Past Medical History:  Diagnosis Date  . Anxiety   . Arthritis   . Depression   . Diverticulosis    . GERD (gastroesophageal reflux disease)   . Graves disease   . Internal hemorrhoids   . Thyroid disease   . Uterine fibroid     Past Surgical History:  Procedure Laterality Date  . ABDOMINAL HYSTERECTOMY     Per patient, uterine fibroids.  . ABDOMINAL HYSTERECTOMY    . CHOLECYSTECTOMY N/A 11/06/2014   Procedure: LAPAROSCOPIC CHOLECYSTECTOMY;  Surgeon: Jamesetta So, MD;  Location: [facility];  Service: General;  Laterality: N/A;  . COLONOSCOPY N/A 03/04/2017   Dr. Gala Romney: Hemorrhoids, diverticulosis, benign polyp without adenomatous changes.  Next colonoscopy 10 years  . DILATION AND CURETTAGE OF UTERUS    . ESOPHAGOGASTRODUODENOSCOPY N/A 10/02/2014   Dr. Gala Romney: fundic gland polyps, hiatal hernia  . MOUTH SURGERY     extraction of teeth  . POLYPECTOMY  03/04/2017   Procedure: POLYPECTOMY;  Surgeon: Daneil Dolin, MD;  Location: [facility];  Service: Endoscopy;;  colon  . THYROIDECTOMY N/A 09/30/2018   Procedure: TOTAL THYROIDECTOMY;  Surgeon: Armandina Gemma, MD;  Location: [facility];  Service: General;  Laterality: N/A;  . TUBAL LIGATION      Current Medications: No outpatient medications have been marked as taking for the 10/05/19 encounter (Appointment) with Donato Heinz, MD.     Allergies:   Levofloxacin, Toradol [ketorolac tromethamine], and Penicillins   Social History   Socioeconomic History  . Marital status: Married    Spouse name: Not on  file  . Number of children: Not on file  . Years of education: Not on file  . Highest education level: Not on file  Occupational History  . Not on file  Tobacco Use  . Smoking status: Former Smoker    Packs/day: 0.50    Years: 3.00    Pack years: 1.50    Types: Cigarettes    Quit date: 11/03/1997    Years since quitting: 21.9  . Smokeless tobacco: Never Used  . Tobacco comment: 1 pack 1 week  Substance and Sexual Activity  . Alcohol use: No    Alcohol/week: 0.0 standard drinks  . Drug use: No  . Sexual activity: Yes     Birth control/protection: Surgical  Other Topics Concern  . Not on file  Social History Narrative  . Not on file   Social Determinants of Health   Financial Resource Strain:   . Difficulty of Paying Living Expenses: Not on file  Food Insecurity:   . Worried About Charity fundraiser in the Last Year: Not on file  . Ran Out of Food in the Last Year: Not on file  Transportation Needs:   . Lack of Transportation (Medical): Not on file  . Lack of Transportation (Non-Medical): Not on file  Physical Activity:   . Days of Exercise per Week: Not on file  . Minutes of Exercise per Session: Not on file  Stress:   . Feeling of Stress : Not on file  Social Connections:   . Frequency of Communication with Friends and Family: Not on file  . Frequency of Social Gatherings with Friends and Family: Not on file  . Attends Religious Services: Not on file  . Active Member of Clubs or Organizations: Not on file  . Attends Archivist Meetings: Not on file  . Marital Status: Not on file     Family History: The patient's family history includes Colon cancer (age of onset: 90) in her paternal aunt; Depression in an other family member; Diabetes in her father; Hypertension in her father; Uterine cancer in her mother and sister. There is no history of Thyroid disease.  ROS:   Please see the history of present illness.     All other systems reviewed and are negative.  EKGs/Labs/Other Studies Reviewed:    The following studies were reviewed today:   EKG:  EKG is ordered today.  The ekg ordered today demonstrates normal sinus rhythm, rate 88, no ST/T abnormalities  Recent Labs: 10/09/2018: ALT 21 09/05/2019: Magnesium 1.8 09/27/2019: BUN 8; Creatinine, Ser 0.75; Hemoglobin 13.0; Platelets 275; Potassium 4.0; Sodium 139; TSH 5.067  Recent Lipid Panel    Component Value Date/Time   CHOL 219 (H) 06/29/2019 1458   TRIG 344.0 (H) 06/29/2019 1458   HDL 41.10 06/29/2019 1458   CHOLHDL 5  06/29/2019 1458   VLDL 68.8 (H) 06/29/2019 1458   LDLDIRECT 135.0 06/29/2019 1458    Physical Exam:    VS:  LMP 10/09/2014     Wt Readings from Last 3 Encounters:  10/04/19 264 lb 9.6 oz (120 kg)  09/27/19 268 lb (121.6 kg)  09/05/19 266 lb (120.7 kg)     GEN:  in no acute distress HEENT: Normal NECK: No JVD LYMPHATICS: No lymphadenopathy CARDIAC: RRR, no murmurs, rubs, gallops RESPIRATORY:  Clear to auscultation without rales, wheezing or rhonchi  ABDOMEN: Soft, non-tender, non-distended MUSCULOSKELETAL:  No edema; No deformity  SKIN: Warm and dry NEUROLOGIC:  Alert and oriented x 3 PSYCHIATRIC:  Normal affect   ASSESSMENT:    No diagnosis found. PLAN:    In order of problems listed above:  Dyspnea/chest pain/fatigue/myalgias: Suspect related to statin use, as reports symptoms started when she began taking pravastatin.  Her 10-year ASCVD risk score is 1.7%, so she would be at low risk for an ASCVD event.  Stopped statin on 08/18/2019.  If continuing to have symptoms once off statin can consider further evaluation with echo and stress test  Hypertension: Reports has not required blood pressure medicine since she started CPAP for her OSA.  BP elevated in clinic today, but appears well controlled on checks at home.  Hyperlipidemia: On pravastatin 20 mg daily.  LDL 135 on 06/29/2019.  Discontinue pravastatin as above  OSA: On CPAP  RTC in ###  Medication Adjustments/Labs and Tests Ordered: Current medicines are reviewed at length with the patient today.  Concerns regarding medicines are outlined above.  No orders of the defined types were placed in this encounter.  No orders of the defined types were placed in this encounter.   There are no Patient Instructions on file for this visit.   Signed, Donato Heinz, MD  10/04/2019 9:32 PM    [place]

## 2019-10-04 NOTE — Progress Notes (Signed)
Subjective:    Patient ID: Heather Fry, female    DOB: 08/18/1976, 44 y.o.   MRN: [id number]  HPI Pt returns for f/u of postsurgical hypothyroidism (she had thyroidectomy 1/20, for hyperthyroidism; she was rx'ed synthroid soon thereafter).  Since on the increased dosage of synthroid, she feels no better.   She also has mild postsurgical hypocalcemia (she took Tums 2-BID; in 1/20, she was seen in ER with facial tingling sxs after it was d/c'ed; she no longer takes Ca++, but she still takes vit-D, 10,000 units/day).   She also has hypomagnesemia: she still takes 250 mg qd.  She reports severe fatigue and anxiety.   She has diffuse arthralgias.   Past Medical History:  Diagnosis Date  . Anxiety   . Arthritis   . Depression   . Diverticulosis   . GERD (gastroesophageal reflux disease)   . Graves disease   . Internal hemorrhoids   . Thyroid disease   . Uterine fibroid     Past Surgical History:  Procedure Laterality Date  . ABDOMINAL HYSTERECTOMY     Per patient, uterine fibroids.  . ABDOMINAL HYSTERECTOMY    . CHOLECYSTECTOMY N/A 11/06/2014   Procedure: LAPAROSCOPIC CHOLECYSTECTOMY;  Surgeon: Jamesetta So, MD;  Location: [facility];  Service: General;  Laterality: N/A;  . COLONOSCOPY N/A 03/04/2017   Dr. Gala Romney: Hemorrhoids, diverticulosis, benign polyp without adenomatous changes.  Next colonoscopy 10 years  . DILATION AND CURETTAGE OF UTERUS    . ESOPHAGOGASTRODUODENOSCOPY N/A 10/02/2014   Dr. Gala Romney: fundic gland polyps, hiatal hernia  . MOUTH SURGERY     extraction of teeth  . POLYPECTOMY  03/04/2017   Procedure: POLYPECTOMY;  Surgeon: Daneil Dolin, MD;  Location: [facility];  Service: Endoscopy;;  colon  . THYROIDECTOMY N/A 09/30/2018   Procedure: TOTAL THYROIDECTOMY;  Surgeon: Armandina Gemma, MD;  Location: [facility];  Service: General;  Laterality: N/A;  . TUBAL LIGATION      Social History   Socioeconomic History  . Marital status: Married    Spouse name: Not on file  .  Number of children: Not on file  . Years of education: Not on file  . Highest education level: Not on file  Occupational History  . Not on file  Tobacco Use  . Smoking status: Former Smoker    Packs/day: 0.50    Years: 3.00    Pack years: 1.50    Types: Cigarettes    Quit date: 11/03/1997    Years since quitting: 21.9  . Smokeless tobacco: Never Used  . Tobacco comment: 1 pack 1 week  Substance and Sexual Activity  . Alcohol use: No    Alcohol/week: 0.0 standard drinks  . Drug use: No  . Sexual activity: Yes    Birth control/protection: Surgical  Other Topics Concern  . Not on file  Social History Narrative  . Not on file   Social Determinants of Health   Financial Resource Strain:   . Difficulty of Paying Living Expenses: Not on file  Food Insecurity:   . Worried About Charity fundraiser in the Last Year: Not on file  . Ran Out of Food in the Last Year: Not on file  Transportation Needs:   . Lack of Transportation (Medical): Not on file  . Lack of Transportation (Non-Medical): Not on file  Physical Activity:   . Days of Exercise per Week: Not on file  . Minutes of Exercise per Session: Not on file  Stress:   .  Feeling of Stress : Not on file  Social Connections:   . Frequency of Communication with Friends and Family: Not on file  . Frequency of Social Gatherings with Friends and Family: Not on file  . Attends Religious Services: Not on file  . Active Member of Clubs or Organizations: Not on file  . Attends Archivist Meetings: Not on file  . Marital Status: Not on file  Intimate Partner Violence:   . Fear of Current or Ex-Partner: Not on file  . Emotionally Abused: Not on file  . Physically Abused: Not on file  . Sexually Abused: Not on file    Current Outpatient Medications on File Prior to Visit  Medication Sig Dispense Refill  . acetaminophen (TYLENOL) 500 MG tablet Take 1,000 mg by mouth daily as needed for moderate pain.    .  Camphor-Eucalyptus-Menthol (VICKS VAPORUB EX) Apply 1 application topically daily as needed (congestion/sinus headaches).    . Cholecalciferol (VITAMIN D3) 125 MCG (5000 UT) CAPS Take 2 capsules (10,000 Units total) by mouth daily. 60 capsule 11  . escitalopram (LEXAPRO) 20 MG tablet Take 1 tablet (20 mg total) by mouth at bedtime. 30 tablet 5  . Magnesium 250 MG TABS Take 1 tablet by mouth daily.    . NON FORMULARY CPAP at [facility]    . omeprazole (PRILOSEC) 20 MG capsule Take 20 mg by mouth 2 (two) times daily before a meal.    . simethicone (PHAZYME) 125 MG chewable tablet Chew 125-250 mg by mouth every 6 (six) hours as needed for flatulence.     . Wheat Dextrin (BENEFIBER DRINK MIX PO) Take 1 Dose by mouth 3 (three) times daily.      No current facility-administered medications on file prior to visit.    Allergies  Allergen Reactions  . Levofloxacin Other (See Comments)    Pt can not have due to taking Lexapro   . Toradol [Ketorolac Tromethamine] Anaphylaxis  . Penicillins Other (See Comments)    Loopy, childhood allergy DID THE REACTION INVOLVE: Swelling of the face/tongue/throat, SOB, or low BP? Unknown Sudden or severe rash/hives, skin peeling, or the inside of the mouth or nose? Unknown Did it require medical treatment? Yes When did it last happen?Childhood allergy If all above answers are "NO", may proceed with cephalosporin use.     Family History  Problem Relation Age of Onset  . Hypertension Father   . Diabetes Father   . Depression Other   . Uterine cancer Mother        ?cervical cancer  . Uterine cancer Sister        suicide  . Colon cancer Paternal Aunt 94  . Thyroid disease Neg Hx     BP 120/80 (BP Location: Left Wrist, Patient Position: Sitting, Cuff Size: Large)   Pulse 98   Ht 5\' 4"  (1.626 m)   Wt 264 lb 9.6 oz (120 kg)   LMP 10/09/2014   SpO2 97%   BMI 45.42 kg/m   Review of Systems She has weight gain.      Objective:   Physical Exam VITAL  SIGNS:  See vs page GENERAL: no distress.   GAIT: steady with a cane.        Assessment & Plan:  Hyperglycemia, new.  Arthralgias, persistent.  Fatigue and anxiety: recheck TFT in a few weeks.    Patient Instructions  I have sent a prescription to your pharmacy, for the name brand synthroid.   you will receive a  phone call, about the rheumatology specialist.  If not, please call [phone].   In 4 weeks, please check the A1c and thyroid blood tests.  Please call ahead, so we can expect you.

## 2019-10-04 NOTE — Patient Instructions (Addendum)
I have sent a prescription to your pharmacy, for the name brand synthroid.   you will receive a phone call, about the rheumatology specialist.  If not, please call [phone].   In 4 weeks, please check the A1c and thyroid blood tests.  Please call ahead, so we can expect you.

## 2019-10-05 ENCOUNTER — Ambulatory Visit: Payer: BC Managed Care – PPO | Admitting: Cardiology

## 2019-10-05 ENCOUNTER — Encounter: Payer: Self-pay | Admitting: Cardiology

## 2019-10-05 ENCOUNTER — Telehealth (INDEPENDENT_AMBULATORY_CARE_PROVIDER_SITE_OTHER): Payer: BC Managed Care – PPO | Admitting: Cardiology

## 2019-10-05 ENCOUNTER — Telehealth: Payer: Self-pay | Admitting: Cardiology

## 2019-10-05 VITALS — BP 120/80 | HR 98 | Ht 64.0 in | Wt 264.0 lb

## 2019-10-05 DIAGNOSIS — R072 Precordial pain: Secondary | ICD-10-CM

## 2019-10-05 DIAGNOSIS — R0600 Dyspnea, unspecified: Secondary | ICD-10-CM

## 2019-10-05 DIAGNOSIS — R079 Chest pain, unspecified: Secondary | ICD-10-CM | POA: Diagnosis not present

## 2019-10-05 DIAGNOSIS — I10 Essential (primary) hypertension: Secondary | ICD-10-CM

## 2019-10-05 DIAGNOSIS — R0609 Other forms of dyspnea: Secondary | ICD-10-CM

## 2019-10-05 DIAGNOSIS — E785 Hyperlipidemia, unspecified: Secondary | ICD-10-CM

## 2019-10-05 NOTE — Telephone Encounter (Signed)
New message   Patient is calling to see if she can make her appt a virtual appt today. Please call to discuss.

## 2019-10-05 NOTE — Patient Instructions (Signed)
Medication Instructions:  Continue same medications *If you need a refill on your cardiac medications before your next appointment, please call your pharmacy*  Lab Work: None ordered   Testing/Procedures: Schedule Echo Schedule Lexiscan   Follow-Up: At [facility], you and your health needs are our priority.  As part of our continuing mission to provide you with exceptional heart care, we have created designated Provider Care Teams.  These Care Teams include your primary Cardiologist (physician) and Advanced Practice Providers (APPs -  Physician Assistants and Nurse Practitioners) who all work together to provide you with the care you need, when you need it.  Your next appointment:  3 months  The format for your next appointment:  Office   Provider:  Dr.Schumann

## 2019-10-05 NOTE — Telephone Encounter (Signed)
Called patient with appointment dates and times for tests that were ordered.  Husband answered and stated patient had gone back to bed and to send her a message in [place].  I have printed and mailed AVS, instructions, and calendars with appointments to patient's home address on file

## 2019-10-05 NOTE — Progress Notes (Signed)
Virtual Visit via Telephone Note   This visit type was conducted due to national recommendations for restrictions regarding the COVID-19 Pandemic (e.g. social distancing) in an effort to limit this patient's exposure and mitigate transmission in our community.  Due to her co-morbid illnesses, this patient is at least at moderate risk for complications without adequate follow up.  This format is felt to be most appropriate for this patient at this time.  The patient did not have access to video technology/had technical difficulties with video requiring transitioning to audio format only (telephone).  All issues noted in this document were discussed and addressed.  No physical exam could be performed with this format.  Please refer to the patient's chart for her  consent to telehealth for [facility].   Date:  10/05/2019   ID:  Heather Fry, DOB October 20, 1975, MRN [id number]  Patient Location: Home Provider Location: Office  PCP:  Redmond School, MD  Cardiologist:  No primary care provider on file.  Electrophysiologist:  None   Evaluation Performed:  Follow-Up Visit  Chief Complaint:  Chest pain  History of Present Illness:    Heather Fry is a 44 y.o. female with a hx of Graves' disease status post thyroidectomy, hypertension, hyperlipidemia, OSA on CPAP who presents for follow-up.  She was initially seen on the 08/18/19 for fatigue/dyspnea/chest pain.  She reports that she had been started on pravastatin 3 weeks prior.  States that since that time she had been having muscle aches, fatigue, weakness, chest pain and shortness of breath.  She went to the ED in [place], work-up was unremarkable.  States she has felt like she did when her thyroid levels were abnormal, but had thyroid levels checked and was unremarkable.  States that she used to walk 3 miles per day, but now is short of breath with minimal exertion.  Also reports chest pain on the right side of chest, under the breast.  Pain  occurs at rest, has not noted any relationship with exertion.  This also has started since she began pravastatin.  She has decreased her pravastatin dose from daily to once per week.  Quit smoking in September 2019.  No known family history heart disease.  Not on any blood pressure meds since started CPAP for her OSA.    Given her 10-year ASCVD risk score 1.7% in symptoms with starting statin, recommended that statin be discontinued.  Plan was to reevaluate in 1 month, and if symptoms continue rosuvastatin, then plan further evaluation with echo and stress test.  She reported that she felt improved with stopping statin initially but has since had recurrence of symptoms.  She presented to the ED on 09/28/2019 with chest pain.  Reported pressure in lower chest that lasted about 30 minutes and resolved.  High-sensitivity troponins negative x2.  Reports that chest pain has improved.  Continues to have dyspnea.   Feels weak and fatigued.  TSH has been elevated, her endocrinologist has been increasing her synthroid.  Also reports joints are swollen and ESR elevated, has been referred to rheumatology.     Past Medical History:  Diagnosis Date  . Anxiety   . Arthritis   . Depression   . Diverticulosis   . GERD (gastroesophageal reflux disease)   . Graves disease   . Internal hemorrhoids   . Thyroid disease   . Uterine fibroid    Past Surgical History:  Procedure Laterality Date  . ABDOMINAL HYSTERECTOMY     Per patient, uterine  fibroids.  . ABDOMINAL HYSTERECTOMY    . CHOLECYSTECTOMY N/A 11/06/2014   Procedure: LAPAROSCOPIC CHOLECYSTECTOMY;  Surgeon: Jamesetta So, MD;  Location: [facility];  Service: General;  Laterality: N/A;  . COLONOSCOPY N/A 03/04/2017   Dr. Gala Romney: Hemorrhoids, diverticulosis, benign polyp without adenomatous changes.  Next colonoscopy 10 years  . DILATION AND CURETTAGE OF UTERUS    . ESOPHAGOGASTRODUODENOSCOPY N/A 10/02/2014   Dr. Gala Romney: fundic gland polyps, hiatal hernia  .  MOUTH SURGERY     extraction of teeth  . POLYPECTOMY  03/04/2017   Procedure: POLYPECTOMY;  Surgeon: Daneil Dolin, MD;  [facility];  Service: Endoscopy;;  colon  . THYROIDECTOMY N/A 09/30/2018   Procedure: TOTAL THYROIDECTOMY;  Surgeon: Armandina Gemma, MD;  Location: [facility];  Service: General;  Laterality: N/A;  . TUBAL LIGATION       Current Meds  Medication Sig  . acetaminophen (TYLENOL) 500 MG tablet Take 1,000 mg by mouth daily as needed for moderate pain.  . Camphor-Eucalyptus-Menthol (VICKS VAPORUB EX) Apply 1 application topically daily as needed (congestion/sinus headaches).  . Cholecalciferol (VITAMIN D3) 125 MCG (5000 UT) CAPS Take 2 capsules (10,000 Units total) by mouth daily.  Marland Kitchen escitalopram (LEXAPRO) 20 MG tablet Take 1 tablet (20 mg total) by mouth at bedtime.  . Magnesium 250 MG TABS Take 1 tablet by mouth daily.  . NON FORMULARY CPAP at [facility]  . omeprazole (PRILOSEC) 20 MG capsule Take 20 mg by mouth 2 (two) times daily before a meal.  . simethicone (PHAZYME) 125 MG chewable tablet Chew 125-250 mg by mouth every 6 (six) hours as needed for flatulence.   Marland Kitchen SYNTHROID 150 MCG tablet Take 1 tablet (150 mcg total) by mouth daily before breakfast.  . Wheat Dextrin (BENEFIBER DRINK MIX PO) Take 1 Dose by mouth 3 (three) times daily.      Allergies:   Levofloxacin, Toradol [ketorolac tromethamine], and Penicillins   Social History   Tobacco Use  . Smoking status: Former Smoker    Packs/day: 0.50    Years: 3.00    Pack years: 1.50    Types: Cigarettes    Quit date: 11/03/1997    Years since quitting: 21.9  . Smokeless tobacco: Never Used  . Tobacco comment: 1 pack 1 week  Substance Use Topics  . Alcohol use: No    Alcohol/week: 0.0 standard drinks  . Drug use: No     Family Hx: The patient's family history includes Colon cancer (age of onset: 95) in her paternal aunt; Depression in an other family member; Diabetes in her father; Hypertension in her father;  Uterine cancer in her mother and sister. There is no history of Thyroid disease.  ROS:   Please see the history of present illness.     All other systems reviewed and are negative.   Prior CV studies:   The following studies were reviewed today:  Labs/Other Tests and Data Reviewed:    EKG:  The ekg ordered 08/18/19 demonstrates normal sinus rhythm, rate 88, no ST/T abnormalities  Recent Labs: 10/09/2018: ALT 21 09/05/2019: Magnesium 1.8 09/27/2019: BUN 8; Creatinine, Ser 0.75; Hemoglobin 13.0; Platelets 275; Potassium 4.0; Sodium 139; TSH 5.067   Recent Lipid Panel Lab Results  Component Value Date/Time   CHOL 219 (H) 06/29/2019 02:58 PM   TRIG 344.0 (H) 06/29/2019 02:58 PM   HDL 41.10 06/29/2019 02:58 PM   CHOLHDL 5 06/29/2019 02:58 PM   LDLDIRECT 135.0 06/29/2019 02:58 PM    Wt  Readings from Last 3 Encounters:  10/05/19 264 lb (119.7 kg)  10/04/19 264 lb 9.6 oz (120 kg)  09/27/19 268 lb (121.6 kg)     Objective:    Vital Signs:  BP 120/80   Pulse 98   Ht '5\' 4"'$  (1.626 m)   Wt 264 lb (119.7 kg)   LMP 10/09/2014   BMI 45.32 kg/m   BP 132/73, P 76  VITAL SIGNS:  reviewed     ASSESSMENT & PLAN:    Dyspnea/chest pain/fatigue/myalgias: initially thought to be secondary to statin use, but continues to have symptoms with discontinuation of statin.  Suspect hypothyroidism contributing, recently increased her Synthroid dose.  Also has been referred to rheumatology for evaluation.  Will check echo to rule out structural heart disease.  Given recent ED visit with chest pain, will check Lexiscan Myoview to evaluate for ischemia.    Hypertension: Reports has not required blood pressure medicine since she started CPAP for her OSA.   Hyperlipidemia: LDL 135 on 06/29/2019.  Her 10-year ASCVD risk score is 1.7%, so she would be at low risk for an ASCVD event.  Stopped statin on 08/18/2019.  OSA: On CPAP  RTC in 3 months   Time:   Today, I have spent 15 minutes with  the patient with telehealth technology discussing the above problems.     Medication Adjustments/Labs and Tests Ordered: Current medicines are reviewed at length with the patient today.  Concerns regarding medicines are outlined above.   Tests Ordered: Orders Placed This Encounter  Procedures  . Myocardial Perfusion Imaging  . ECHOCARDIOGRAM COMPLETE    Medication Changes: No orders of the defined types were placed in this encounter.   Follow Up:  In Person in 3 month(s)  Signed, Donato Heinz, MD  10/05/2019 9:58 AM    [place]

## 2019-10-05 NOTE — Telephone Encounter (Signed)
Spoke to patient Dr.Schumann advised ok to do a virtual visit.

## 2019-10-06 ENCOUNTER — Other Ambulatory Visit: Payer: Self-pay

## 2019-10-06 ENCOUNTER — Telehealth (HOSPITAL_COMMUNITY): Payer: Self-pay

## 2019-10-06 ENCOUNTER — Encounter (HOSPITAL_COMMUNITY): Payer: Self-pay | Admitting: Emergency Medicine

## 2019-10-06 ENCOUNTER — Telehealth: Payer: Self-pay | Admitting: Endocrinology

## 2019-10-06 ENCOUNTER — Emergency Department (HOSPITAL_COMMUNITY)
Admission: EM | Admit: 2019-10-06 | Discharge: 2019-10-06 | Disposition: A | Discharge status: Home / Self Care | Payer: BC Managed Care – PPO | Attending: Emergency Medicine | Admitting: Emergency Medicine

## 2019-10-06 DIAGNOSIS — R200 Anesthesia of skin: Secondary | ICD-10-CM

## 2019-10-06 DIAGNOSIS — L03211 Cellulitis of face: Secondary | ICD-10-CM | POA: Diagnosis not present

## 2019-10-06 DIAGNOSIS — Z79899 Other long term (current) drug therapy: Secondary | ICD-10-CM | POA: Insufficient documentation

## 2019-10-06 DIAGNOSIS — Z87891 Personal history of nicotine dependence: Secondary | ICD-10-CM | POA: Diagnosis not present

## 2019-10-06 DIAGNOSIS — R202 Paresthesia of skin: Secondary | ICD-10-CM | POA: Diagnosis not present

## 2019-10-06 DIAGNOSIS — L03113 Cellulitis of right upper limb: Secondary | ICD-10-CM | POA: Diagnosis not present

## 2019-10-06 DIAGNOSIS — L0390 Cellulitis, unspecified: Secondary | ICD-10-CM

## 2019-10-06 LAB — CBC WITH DIFFERENTIAL/PLATELET
Abs Immature Granulocytes: 0.05 10*3/uL (ref 0.00–0.07)
Basophils Absolute: 0 10*3/uL (ref 0.0–0.1)
Basophils Relative: 0 %
Eosinophils Absolute: 0.1 10*3/uL (ref 0.0–0.5)
Eosinophils Relative: 1 %
HCT: 41 % (ref 36.0–46.0)
Hemoglobin: 13.6 g/dL (ref 12.0–15.0)
Immature Granulocytes: 0 %
Lymphocytes Relative: 19 %
Lymphs Abs: 2.3 10*3/uL (ref 0.7–4.0)
MCH: 30 pg (ref 26.0–34.0)
MCHC: 33.2 g/dL (ref 30.0–36.0)
MCV: 90.5 fL (ref 80.0–100.0)
Monocytes Absolute: 0.4 10*3/uL (ref 0.1–1.0)
Monocytes Relative: 3 %
Neutro Abs: 9 10*3/uL — ABNORMAL HIGH (ref 1.7–7.7)
Neutrophils Relative %: 77 %
Platelets: 292 10*3/uL (ref 150–400)
RBC: 4.53 MIL/uL (ref 3.87–5.11)
RDW: 13.9 % (ref 11.5–15.5)
WBC: 11.8 10*3/uL — ABNORMAL HIGH (ref 4.0–10.5)
nRBC: 0 % (ref 0.0–0.2)

## 2019-10-06 LAB — COMPREHENSIVE METABOLIC PANEL
ALT: 28 U/L (ref 0–44)
AST: 23 U/L (ref 15–41)
Albumin: 4.2 g/dL (ref 3.5–5.0)
Alkaline Phosphatase: 51 U/L (ref 38–126)
Anion gap: 12 (ref 5–15)
BUN: 10 mg/dL (ref 6–20)
CO2: 23 mmol/L (ref 22–32)
Calcium: 9.5 mg/dL (ref 8.9–10.3)
Chloride: 104 mmol/L (ref 98–111)
Creatinine, Ser: 0.62 mg/dL (ref 0.44–1.00)
GFR calc Af Amer: >60 mL/min (ref >60)
GFR calc non Af Amer: >60 mL/min (ref >60)
Glucose, Bld: 94 mg/dL (ref 70–99)
Potassium: 3.7 mmol/L (ref 3.5–5.1)
Sodium: 139 mmol/L (ref 135–145)
Total Bilirubin: 0.2 mg/dL — ABNORMAL LOW (ref 0.3–1.2)
Total Protein: 8.4 g/dL — ABNORMAL HIGH (ref 6.5–8.1)

## 2019-10-06 LAB — TSH: TSH: 3.09 u[IU]/mL (ref 0.350–4.500)

## 2019-10-06 LAB — MAGNESIUM: Magnesium: 2.1 mg/dL (ref 1.7–2.4)

## 2019-10-06 MED ORDER — CLINDAMYCIN PHOSPHATE 300 MG/50ML IV SOLN
300.0000 mg | Freq: Once | INTRAVENOUS | Status: AC
  Administered 2019-10-06: 300 mg via INTRAVENOUS
  Filled 2019-10-06: qty 50

## 2019-10-06 MED ORDER — CLINDAMYCIN HCL 150 MG PO CAPS: 150.0000 mg | ORAL_CAPSULE | Freq: Four times a day (QID) | ORAL | 0 refills | Status: DC

## 2019-10-06 NOTE — ED Provider Notes (Addendum)
[facility] Provider Note   CSN: [id number] Arrival date & time: 10/06/19  1526     History Chief Complaint  Patient presents with  . Numbness    Heather Fry is a 44 y.o. female.  Chief complaint numbness and tingling in right face and right hand.  Past medical history includes Graves' disease for which she had a thyroidectomy approximately 1 year ago.  Synthroid was recently increased from 135-150 mcg/day.  She has had trouble with calcium and magnesium levels in the past.  She was possibly bit by an insect on Sunday night on her chin which resulted in a large swollen area.  She thinks this may be contributing to her symptoms.  ROS right ear pain.  No obvious tooth or gingival pain.  Patient noted vertiginous symptoms 3 to 4 days ago, but none now.        Past Medical History:  Diagnosis Date  . Anxiety   . Arthritis   . Depression   . Diverticulosis   . GERD (gastroesophageal reflux disease)   . Graves disease   . Internal hemorrhoids   . Thyroid disease   . Uterine fibroid     Patient Active Problem List   Diagnosis Date Noted  . Hyperglycemia 10/04/2019  . Myalgia 09/05/2019  . Vitamin B 12 deficiency 07/28/2019  . Numbness 06/29/2019  . Constipation 06/15/2019  . Elevated lipase 06/15/2019  . Hypomagnesemia 03/29/2019  . Leukocytosis 03/29/2019  . Abdominal pain 02/10/2019  . Nausea without vomiting 02/10/2019  . Tingling of face 11/30/2018  . Flushing 11/30/2018  . Vitamin D deficiency 10/06/2018  . GERD (gastroesophageal reflux disease) 01/28/2017  . Rectal bleeding 01/28/2017  . 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 Hypothyroidism 04/14/2016  . Gastric polyp   . RUQ abdominal pain 09/06/2014  . Excessive or frequent menstruation 01/13/2013  . Depression     Past Surgical History:  Procedure Laterality Date  . ABDOMINAL HYSTERECTOMY     Per patient, uterine fibroids.  . ABDOMINAL HYSTERECTOMY    . CHOLECYSTECTOMY N/A 11/06/2014     Procedure: LAPAROSCOPIC CHOLECYSTECTOMY;  Surgeon: Jamesetta So, MD;  Location: [facility];  Service: General;  Laterality: N/A;  . COLONOSCOPY N/A 03/04/2017   Dr. Gala Romney: Hemorrhoids, diverticulosis, benign polyp without adenomatous changes.  Next colonoscopy 10 years  . DILATION AND CURETTAGE OF UTERUS    . ESOPHAGOGASTRODUODENOSCOPY N/A 10/02/2014   Dr. Gala Romney: fundic gland polyps, hiatal hernia  . MOUTH SURGERY     extraction of teeth  . POLYPECTOMY  03/04/2017   Procedure: POLYPECTOMY;  Surgeon: Daneil Dolin, MD;  [facility];  Service: Endoscopy;;  colon  . THYROIDECTOMY N/A 09/30/2018   Procedure: TOTAL THYROIDECTOMY;  Surgeon: Armandina Gemma, MD;  Location: [facility];  Service: General;  Laterality: N/A;  . TUBAL LIGATION       OB History    Gravida  2   Para  2   Term  2   Preterm      AB      Living  2     SAB      TAB      Ectopic      Multiple      Live Births              Family History  Problem Relation Age of Onset  . Hypertension Father   . Diabetes Father   . Depression Other   . Uterine cancer Mother        ?  cervical cancer  . Uterine cancer Sister        suicide  . Colon cancer Paternal Aunt 91  . Thyroid disease Neg Hx     Social History   Tobacco Use  . Smoking status: Former Smoker    Packs/day: 0.50    Years: 3.00    Pack years: 1.50    Types: Cigarettes    Quit date: 11/03/1997    Years since quitting: 21.9  . Smokeless tobacco: Never Used  . Tobacco comment: 1 pack 1 week  Substance Use Topics  . Alcohol use: No    Alcohol/week: 0.0 standard drinks  . Drug use: No    Home Medications Prior to Admission medications   Medication Sig Start Date End Date Taking? Authorizing Provider  acetaminophen (TYLENOL) 500 MG tablet Take 1,000 mg by mouth daily as needed for moderate pain.    [provider]  Camphor-Eucalyptus-Menthol (VICKS VAPORUB EX) Apply 1 application topically daily as needed (congestion/sinus  headaches).    [provider]  Cholecalciferol (VITAMIN D3) 125 MCG (5000 UT) CAPS Take 2 capsules (10,000 Units total) by mouth daily. 06/08/19   Renato Shin, MD  escitalopram (LEXAPRO) 20 MG tablet Take 1 tablet (20 mg total) by mouth at bedtime. 01/26/19   Renato Shin, MD  Magnesium 250 MG TABS Take 1 tablet by mouth daily.    [provider]  NON FORMULARY CPAP at night    [provider]  omeprazole (PRILOSEC) 20 MG capsule Take 20 mg by mouth 2 (two) times daily before a meal.    [provider]  simethicone (PHAZYME) 125 MG chewable tablet Chew 125-250 mg by mouth every 6 (six) hours as needed for flatulence.     [provider]  SYNTHROID 150 MCG tablet Take 1 tablet (150 mcg total) by mouth daily before breakfast. 10/04/19   Renato Shin, MD  Wheat Dextrin (BENEFIBER DRINK MIX PO) Take 1 Dose by mouth 3 (three) times daily.     [provider]    Allergies    Levofloxacin, Toradol [ketorolac tromethamine], and Penicillins  Review of Systems   Review of Systems  All other systems reviewed and are negative.   Physical Exam Updated Vital Signs BP (!) 156/93   Pulse 82   Temp 98.6 F (37 C) (Oral)   Resp 18   Ht 5\' 4"  (1.626 m)   Wt 119 kg   LMP 10/09/2014   SpO2 98%   BMI 45.03 kg/m   Physical Exam Vitals and nursing note reviewed.  Constitutional:      Appearance: She is well-developed.  HENT:     Head: Normocephalic and atraumatic.     Comments: Right side of face questionably slightly puffy and erythematous.  Tympanic membrane looks normal.  No obvious dental issues. Eyes:     Conjunctiva/sclera: Conjunctivae normal.  Cardiovascular:     Rate and Rhythm: Normal rate and regular rhythm.  Pulmonary:     Effort: Pulmonary effort is normal.     Breath sounds: Normal breath sounds.  Abdominal:     General: Bowel sounds are normal.     Palpations: Abdomen is soft.  Musculoskeletal:        General: Normal  range of motion.     Cervical back: Neck supple.  Skin:    General: Skin is warm and dry.  Neurological:     General: No focal deficit present.     Mental Status: She is alert and oriented  to person, place, and time.  Psychiatric:        Behavior: Behavior normal.     ED Results / Procedures / Treatments   Labs (all labs ordered are listed, but only abnormal results are displayed) Labs Reviewed  CBC WITH DIFFERENTIAL/PLATELET - Abnormal; Notable for the following components:      Result Value   WBC 11.8 (*)    Neutro Abs 9.0 (*)    All other components within normal limits  COMPREHENSIVE METABOLIC PANEL - Abnormal; Notable for the following components:   Total Protein 8.4 (*)    Total Bilirubin 0.2 (*)    All other components within normal limits  MAGNESIUM  TSH    EKG None  Radiology No results found.  Procedures Procedures (including critical care time)  Medications Ordered in ED Medications - No data to display  ED Course  I have reviewed the triage vital signs and the nursing notes.  Pertinent labs & imaging results that were available during my care of the patient were reviewed by me and considered in my medical decision making (see chart for details).    MDM Rules/Calculators/A&P                      Patient is in no acute distress at this time.  Will check calcium, magnesium, TSH.  Magnesium, calcium, TSH all within normal limits.  On further examination, I am concerned about the possibility of facial cellulitis.  Dose of IV clindamycin.  Will discharge home on oral clindamycin. Final Clinical Impression(s) / ED Diagnoses Final diagnoses:  Numbness and tingling    Rx / DC Orders ED Discharge Orders    None       Nat Christen, MD 10/06/19 Dorthula Perfect    Nat Christen, MD 10/06/19 [id number]

## 2019-10-06 NOTE — Discharge Instructions (Signed)
Prescription for antibiotic sent to your pharmacy.  Tylenol or ibuprofen for pain or fever.  Return if your symptoms worsen.

## 2019-10-06 NOTE — ED Notes (Signed)
PA in triage assessing patient.

## 2019-10-06 NOTE — ED Triage Notes (Signed)
Pt reports intermittent numbness from jaw to right side of face and both lips. Pt reports intermittent tingling right hand and chest. Pt reports history of same when calcium and magnesium levels were abnormal. Pt reports was seen for similar and thyroid levels were elevated. Pt reports took tums with an episode the other night and reports symptoms resolved. Pt reports symptoms come and go and last for approximately 15 minutes.   During triage numbness started in face resolved and pt states numbness in right hand. "it rotates."  Pt alert and oriented. Facial symmetry and sensation intact. Pt reports "if I move something it then goes numb." pt reports generalized weakness. No vision changes, dizziness.

## 2019-10-06 NOTE — Telephone Encounter (Signed)
Patient requests to be called at ph# [phone] re: Patient requests that Referral for Rheumatology be sent to Dr. Amil Amen at [facility]. (Not Dr. Estanislado Pandy).

## 2019-10-06 NOTE — Telephone Encounter (Signed)
Are you able to assist with this referral?

## 2019-10-06 NOTE — Telephone Encounter (Signed)
Please refer to pt request 

## 2019-10-06 NOTE — Telephone Encounter (Signed)
I have put is ref twice.  Any dr is fine with me.

## 2019-10-06 NOTE — Telephone Encounter (Signed)
Encounter complete. 

## 2019-10-10 ENCOUNTER — Encounter (HOSPITAL_COMMUNITY): Payer: Self-pay | Admitting: *Deleted

## 2019-10-10 ENCOUNTER — Other Ambulatory Visit: Payer: Self-pay

## 2019-10-10 ENCOUNTER — Emergency Department (HOSPITAL_COMMUNITY): Payer: BC Managed Care – PPO

## 2019-10-10 ENCOUNTER — Emergency Department (HOSPITAL_COMMUNITY)
Admission: EM | Admit: 2019-10-10 | Discharge: 2019-10-10 | Disposition: A | Discharge status: Home / Self Care | Payer: BC Managed Care – PPO | Attending: Emergency Medicine | Admitting: Emergency Medicine

## 2019-10-10 DIAGNOSIS — R0789 Other chest pain: Secondary | ICD-10-CM

## 2019-10-10 DIAGNOSIS — E039 Hypothyroidism, unspecified: Secondary | ICD-10-CM | POA: Insufficient documentation

## 2019-10-10 DIAGNOSIS — Z20822 Contact with and (suspected) exposure to covid-19: Secondary | ICD-10-CM | POA: Diagnosis not present

## 2019-10-10 DIAGNOSIS — Z79899 Other long term (current) drug therapy: Secondary | ICD-10-CM | POA: Diagnosis not present

## 2019-10-10 DIAGNOSIS — Z87891 Personal history of nicotine dependence: Secondary | ICD-10-CM | POA: Diagnosis not present

## 2019-10-10 DIAGNOSIS — R0602 Shortness of breath: Secondary | ICD-10-CM

## 2019-10-10 DIAGNOSIS — R079 Chest pain, unspecified: Secondary | ICD-10-CM | POA: Diagnosis not present

## 2019-10-10 LAB — URINALYSIS, ROUTINE W REFLEX MICROSCOPIC
Bilirubin Urine: NEGATIVE
Glucose, UA: NEGATIVE mg/dL
Hgb urine dipstick: NEGATIVE
Ketones, ur: NEGATIVE mg/dL
Leukocytes,Ua: NEGATIVE
Nitrite: NEGATIVE
Protein, ur: NEGATIVE mg/dL
Specific Gravity, Urine: 1.015 (ref 1.005–1.030)
pH: 8 (ref 5.0–8.0)

## 2019-10-10 LAB — COMPREHENSIVE METABOLIC PANEL
ALT: 27 U/L (ref 0–44)
AST: 20 U/L (ref 15–41)
Albumin: 4.2 g/dL (ref 3.5–5.0)
Alkaline Phosphatase: 48 U/L (ref 38–126)
Anion gap: 12 (ref 5–15)
BUN: 9 mg/dL (ref 6–20)
CO2: 24 mmol/L (ref 22–32)
Calcium: 9.3 mg/dL (ref 8.9–10.3)
Chloride: 103 mmol/L (ref 98–111)
Creatinine, Ser: 0.63 mg/dL (ref 0.44–1.00)
GFR calc Af Amer: >60 mL/min (ref >60)
GFR calc non Af Amer: >60 mL/min (ref >60)
Glucose, Bld: 107 mg/dL — ABNORMAL HIGH (ref 70–99)
Potassium: 3.6 mmol/L (ref 3.5–5.1)
Sodium: 139 mmol/L (ref 135–145)
Total Bilirubin: 0.6 mg/dL (ref 0.3–1.2)
Total Protein: 8.3 g/dL — ABNORMAL HIGH (ref 6.5–8.1)

## 2019-10-10 LAB — CBC WITH DIFFERENTIAL/PLATELET
Abs Immature Granulocytes: 0.04 10*3/uL (ref 0.00–0.07)
Basophils Absolute: 0 10*3/uL (ref 0.0–0.1)
Basophils Relative: 0 %
Eosinophils Absolute: 0 10*3/uL (ref 0.0–0.5)
Eosinophils Relative: 0 %
HCT: 41 % (ref 36.0–46.0)
Hemoglobin: 13.2 g/dL (ref 12.0–15.0)
Immature Granulocytes: 0 %
Lymphocytes Relative: 19 %
Lymphs Abs: 2.1 10*3/uL (ref 0.7–4.0)
MCH: 29.3 pg (ref 26.0–34.0)
MCHC: 32.2 g/dL (ref 30.0–36.0)
MCV: 90.9 fL (ref 80.0–100.0)
Monocytes Absolute: 0.4 10*3/uL (ref 0.1–1.0)
Monocytes Relative: 4 %
Neutro Abs: 8.4 10*3/uL — ABNORMAL HIGH (ref 1.7–7.7)
Neutrophils Relative %: 77 %
Platelets: 296 10*3/uL (ref 150–400)
RBC: 4.51 MIL/uL (ref 3.87–5.11)
RDW: 13.8 % (ref 11.5–15.5)
WBC: 11 10*3/uL — ABNORMAL HIGH (ref 4.0–10.5)
nRBC: 0 % (ref 0.0–0.2)

## 2019-10-10 LAB — D-DIMER, QUANTITATIVE: D-Dimer, Quant: 0.35 ug/mL-FEU (ref 0.00–0.50)

## 2019-10-10 LAB — LIPASE, BLOOD: Lipase: 35 U/L (ref 11–51)

## 2019-10-10 LAB — TSH: TSH: 2.416 u[IU]/mL (ref 0.350–4.500)

## 2019-10-10 LAB — TROPONIN I (HIGH SENSITIVITY): Troponin I (High Sensitivity): <2 ng/L (ref <18)

## 2019-10-10 IMAGING — DX DG CHEST 1V PORT
1 series · 1 of 1 positions shown · non-contrast
Comparison: [DATE]

CLINICAL DATA: Chest pain and shortness of breath.

EXAM:
PORTABLE CHEST 1 VIEW

[chest ap grid]
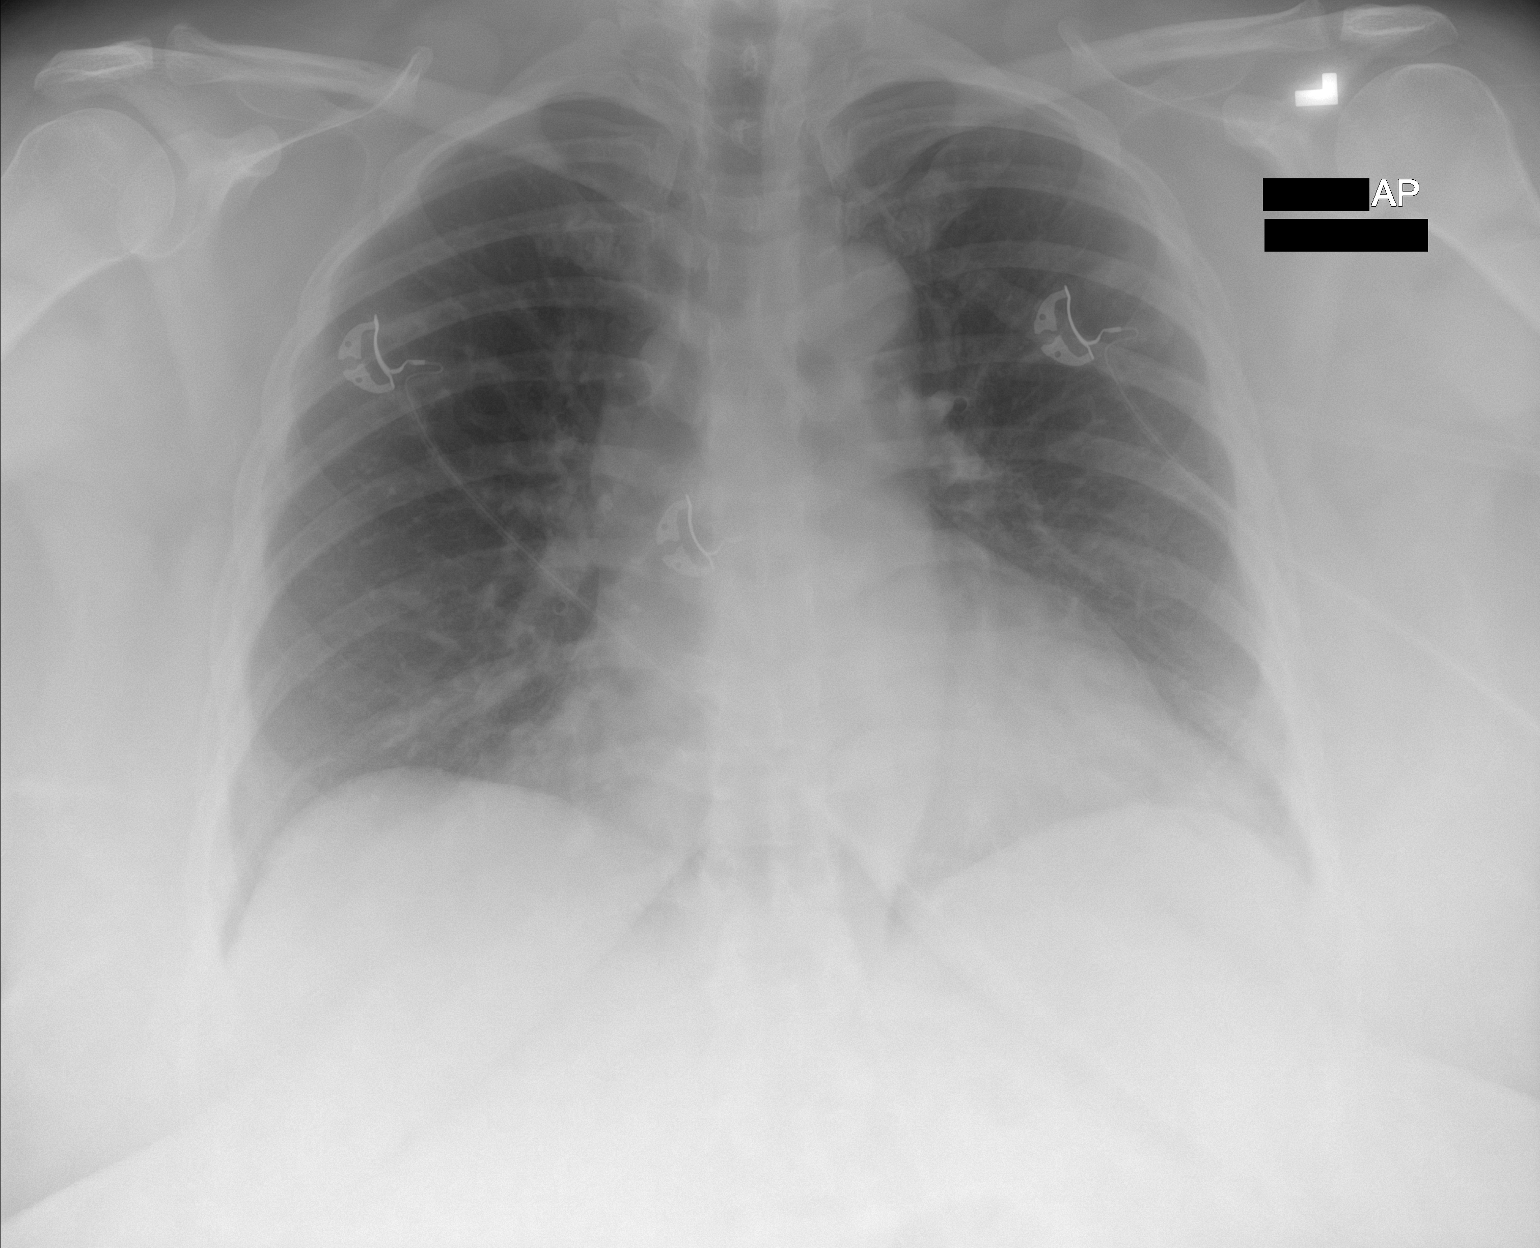

[1 of 1 positions shown; findings below may reference images not displayed]

FINDINGS: The heart size and mediastinal contours are within normal limits.
Both lungs are clear. The visualized skeletal structures are
unremarkable.
IMPRESSION: No active disease.

## 2019-10-10 MED ORDER — ALUM & MAG HYDROXIDE-SIMETH 200-200-20 MG/5ML PO SUSP
30.0000 mL | Freq: Once | ORAL | Status: AC
  Administered 2019-10-10: 30 mL via ORAL
  Filled 2019-10-10: qty 30

## 2019-10-10 MED ORDER — HYDROXYZINE HCL 25 MG PO TABS
50.0000 mg | ORAL_TABLET | Freq: Once | ORAL | Status: AC
  Administered 2019-10-10: 50 mg via ORAL
  Filled 2019-10-10: qty 2

## 2019-10-10 MED ORDER — HYDROXYZINE HCL 50 MG PO TABS: 50.0000 mg | ORAL_TABLET | Freq: Two times a day (BID) | ORAL | 0 refills | Status: DC | PRN

## 2019-10-10 NOTE — ED Triage Notes (Signed)
Pt c/o chest pressure and SOB that started 2 weeks ago. Pt was seen here 2 weeks ago for same symptoms and had her thyroid medication increased.

## 2019-10-10 NOTE — Discharge Instructions (Addendum)
You have been seen today for shortness of breath. Please read and follow all provided instructions. Return to the emergency room for worsening condition or new concerning symptoms.    Your Covid test is pending.  The results should be available in 1 to 2 days.  You will notified if the result is positive.  If it is negative it will be available for you to see in your MyChart.  If you test positive for Covid it is a virus and antibiotics are not typically given.  You can treat your symptoms with Tylenol for body aches, headache.  Cough medicine if you are coughing.  Nasal saline for nasal congestion  1. Medications:  Prescription has been sent to your pharmacy for hydroxyzine.  This is a medicine used to treat anxiety.  Please take this as we discussed, recommending 1 dose daily only for severe anxiety. Continue usual home medications Take medications as prescribed. Please review all of the medicines and only take them if you do not have an allergy to them.   2. Treatment: rest, drink plenty of fluids  3. Follow Up:  Please follow up with primary care provider by scheduling an appointment as soon as possible for a visit  -Also recommend you keep your follow-up appointment scheduled with cardiology and rheumatology   It is also a possibility that you have an allergic reaction to any of the medicines that you have been prescribed - Everybody reacts differently to medications and while MOST people have no trouble with most medicines, you may have a reaction such as nausea, vomiting, rash, swelling, shortness of breath. If this is the case, please stop taking the medicine immediately and contact your physician.  ?

## 2019-10-10 NOTE — ED Provider Notes (Signed)
[facility] EMERGENCY DEPARTMENT Provider Note   CSN: [id number] Arrival date & time: 10/10/19  1518     History Chief Complaint  Patient presents with  . Shortness of Breath    Heather Fry is a 44 y.o. female with past medical history significant for anxiety, depression, GERD, Graves' disease presenting to emergency department today with chief complaint of shortness of breath and chest pain x2 weeks. She states symptoms have been intermittent.  She states she was sleeping last night and wearing her CPAP as she usually does when she was awaken suddenly with shortness of breath and chest pressure. She describes the shortness of breath as it feeling very hard to breath and catch her breath. She described the chest pain as more of a pressure located in the center of her chest, no radiation of pain. No modifying factors. She did not take any medications for her symptoms prior to arrival. She denies fever, chills, cough, congestion, dyspnea on exertion, syncope, palpitations, lower extremity edema,  abdominal pain, nausea, vomiting, urinary symptoms, diarrhea. She denies family history of cardiac disease.  Of note, patient has been seen in the ED multiple times recently for similar symptoms. She has also followed up outpatient with cardiology Dr Gardiner Rhyme and her endocrinologist who changed synthroid dose. Patient is still waiting to follow up with rheumatology and is working on scheduling outpatient appointment. She has outpatient myocardial perfusion study and echo scheduled for this week. She was prescribed Clindamycin at last ED visit for possible facial cellulitis. She took antibiotic x 2 days but then had abdominal pain and discomfort and stopped taking antibiotic. Her facial swelling and erythema resolved despite not completing full course of antibiotics.  Past Medical History:  Diagnosis Date  . Anxiety   . Arthritis   . Depression   . Diverticulosis   . GERD (gastroesophageal reflux  disease)   . Graves disease   . Internal hemorrhoids   . Thyroid disease   . Uterine fibroid     Patient Active Problem List   Diagnosis Date Noted  . Hyperglycemia 10/04/2019  . Myalgia 09/05/2019  . Vitamin B 12 deficiency 07/28/2019  . Numbness 06/29/2019  . Constipation 06/15/2019  . Elevated lipase 06/15/2019  . Hypomagnesemia 03/29/2019  . Leukocytosis 03/29/2019  . Abdominal pain 02/10/2019  . Nausea without vomiting 02/10/2019  . Tingling of face 11/30/2018  . Flushing 11/30/2018  . Vitamin D deficiency 10/06/2018  . GERD (gastroesophageal reflux disease) 01/28/2017  . Rectal bleeding 01/28/2017  . 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 Hypothyroidism 04/14/2016  . Gastric polyp   . RUQ abdominal pain 09/06/2014  . Excessive or frequent menstruation 01/13/2013  . Depression     Past Surgical History:  Procedure Laterality Date  . ABDOMINAL HYSTERECTOMY     Per patient, uterine fibroids.  . ABDOMINAL HYSTERECTOMY    . CHOLECYSTECTOMY N/A 11/06/2014   Procedure: LAPAROSCOPIC CHOLECYSTECTOMY;  Surgeon: Jamesetta So, MD;  Location: [facility];  Service: General;  Laterality: N/A;  . COLONOSCOPY N/A 03/04/2017   Dr. Gala Romney: Hemorrhoids, diverticulosis, benign polyp without adenomatous changes.  Next colonoscopy 10 years  . DILATION AND CURETTAGE OF UTERUS    . ESOPHAGOGASTRODUODENOSCOPY N/A 10/02/2014   Dr. Gala Romney: fundic gland polyps, hiatal hernia  . MOUTH SURGERY     extraction of teeth  . POLYPECTOMY  03/04/2017   Procedure: POLYPECTOMY;  Surgeon: Daneil Dolin, MD;  Location: [facility];  Service: Endoscopy;;  colon  . THYROIDECTOMY N/A 09/30/2018  Procedure: TOTAL THYROIDECTOMY;  Surgeon: Armandina Gemma, MD;  Location: [facility];  Service: General;  Laterality: N/A;  . TUBAL LIGATION       OB History    Gravida  2   Para  2   Term  2   Preterm      AB      Living  2     SAB      TAB      Ectopic      Multiple      Live Births              Family  History  Problem Relation Age of Onset  . Hypertension Father   . Diabetes Father   . Depression Other   . Uterine cancer Mother        ?cervical cancer  . Uterine cancer Sister        suicide  . Colon cancer Paternal Aunt 32  . Thyroid disease Neg Hx     Social History   Tobacco Use  . Smoking status: Former Smoker    Packs/day: 0.50    Years: 3.00    Pack years: 1.50    Types: Cigarettes    Quit date: 11/03/1997    Years since quitting: 21.9  . Smokeless tobacco: Never Used  . Tobacco comment: 1 pack 1 week  Substance Use Topics  . Alcohol use: No    Alcohol/week: 0.0 standard drinks  . Drug use: No    Home Medications Prior to Admission medications   Medication Sig Start Date End Date Taking? Authorizing Provider  acetaminophen (TYLENOL) 500 MG tablet Take 1,000 mg by mouth daily as needed for moderate pain.   Yes [provider]  Cholecalciferol (VITAMIN D3) 125 MCG (5000 UT) CAPS Take 2 capsules (10,000 Units total) by mouth daily. 06/08/19  Yes Renato Shin, MD  docusate sodium (COLACE) 100 MG capsule Take 100 mg by mouth at bedtime.   Yes [provider]  escitalopram (LEXAPRO) 20 MG tablet Take 1 tablet (20 mg total) by mouth at bedtime. 01/26/19  Yes Renato Shin, MD  Magnesium 250 MG TABS Take 1 tablet by mouth daily.   Yes [provider]  NON FORMULARY at bedtime. CPAP at night    Yes [provider]  omeprazole (PRILOSEC) 20 MG capsule Take 20 mg by mouth 2 (two) times daily before a meal.   Yes [provider]  simethicone (PHAZYME) 125 MG chewable tablet Chew 125-250 mg by mouth every 6 (six) hours as needed for flatulence.    Yes [provider]  Wheat Dextrin (BENEFIBER DRINK MIX PO) Take 1 Dose by mouth 3 (three) times daily.    Yes [provider]  hydrOXYzine (ATARAX/VISTARIL) 50 MG tablet Take 1 tablet (50 mg total) by mouth 2 (two) times daily as needed for anxiety. 10/10/19   Jora Galluzzo, Harley Hallmark, PA-C  SYNTHROID 150 MCG tablet Take 1 tablet (150 mcg total) by mouth daily before breakfast. 10/04/19   Renato Shin, MD    Allergies    Levofloxacin, Toradol [ketorolac tromethamine], and Penicillins  Review of Systems   Review of Systems  All other systems are reviewed and are negative for acute change except as noted in the HPI.   Physical Exam Updated Vital Signs BP (!) 135/96   Pulse 85   Temp 98.6 F (37 C) (Oral)   Resp 18   Ht 5\' 4"  (1.626 m)   Wt 119.7 kg  LMP 10/09/2014   SpO2 99%   BMI 45.32 kg/m   Physical Exam Vitals and nursing note reviewed.  Constitutional:      General: She is not in acute distress.    Appearance: She is not ill-appearing.  HENT:     Head: Normocephalic and atraumatic.     Right Ear: Tympanic membrane and external ear normal.     Left Ear: Tympanic membrane and external ear normal.     Nose: Nose normal.     Mouth/Throat:     Mouth: Mucous membranes are moist.     Pharynx: Oropharynx is clear.  Eyes:     General: No scleral icterus.       Right eye: No discharge.        Left eye: No discharge.     Extraocular Movements: Extraocular movements intact.     Conjunctiva/sclera: Conjunctivae normal.     Pupils: Pupils are equal, round, and reactive to light.  Neck:     Vascular: No JVD.  Cardiovascular:     Rate and Rhythm: Normal rate and regular rhythm.     Pulses: Normal pulses.          Radial pulses are 2+ on the right side and 2+ on the left side.     Heart sounds: Normal heart sounds.  Pulmonary:     Comments: Lungs clear to auscultation in all fields. Symmetric chest rise. No wheezing, rales, or rhonchi. Chest:     Chest wall: No tenderness.  Abdominal:     Comments: Abdomen is soft, non-distended, and non-tender in all quadrants. No rigidity, no guarding. No peritoneal signs.  Musculoskeletal:        General: Normal range of motion.     Cervical back: Normal range of motion.     Right lower leg: No edema.     Left  lower leg: No edema.  Skin:    General: Skin is warm and dry.     Capillary Refill: Capillary refill takes less than 2 seconds.  Neurological:     Mental Status: She is oriented to person, place, and time.     GCS: GCS eye subscore is 4. GCS verbal subscore is 5. GCS motor subscore is 6.     Comments: Fluent speech, no facial droop.  Psychiatric:        Mood and Affect: Mood is anxious. Affect is tearful.        Behavior: Behavior normal.       ED Results / Procedures / Treatments   Labs (all labs ordered are listed, but only abnormal results are displayed) Labs Reviewed  CBC WITH DIFFERENTIAL/PLATELET - Abnormal; Notable for the following components:      Result Value   WBC 11.0 (*)    Neutro Abs 8.4 (*)    All other components within normal limits  COMPREHENSIVE METABOLIC PANEL - Abnormal; Notable for the following components:   Glucose, Bld 107 (*)    Total Protein 8.3 (*)    All other components within normal limits  URINALYSIS, ROUTINE W REFLEX MICROSCOPIC - Abnormal; Notable for the following components:   APPearance HAZY (*)    All other components within normal limits  SARS CORONAVIRUS 2 (TAT 6-24 HRS)  D-DIMER, QUANTITATIVE (NOT AT [facility])  TSH  LIPASE, BLOOD  TROPONIN I (HIGH SENSITIVITY)    EKG EKG Interpretation  Date/Time:  Monday October 10 2019 15:57:17 EST Ventricular Rate:  79 PR Interval:  150 QRS Duration: 66 QT Interval:  410 QTC Calculation: 470 R Axis:   45 Text Interpretation: Normal sinus rhythm Low voltage QRS Borderline ECG Confirmed by Milton Ferguson [phone]) on 10/10/2019 10:03:52 PM   Radiology DG Chest Port 1 View  Result Date: 10/10/2019 CLINICAL DATA:  Chest pain and shortness of breath. EXAM: PORTABLE CHEST 1 VIEW COMPARISON:  September 28, 2019 FINDINGS: The heart size and mediastinal contours are within normal limits. Both lungs are clear. The visualized skeletal structures are unremarkable. IMPRESSION: No active disease.  Electronically Signed   By: Constance Holster M.D.   On: 10/10/2019 21:38    Procedures Procedures (including critical care time)  Medications Ordered in ED Medications  hydrOXYzine (ATARAX/VISTARIL) tablet 50 mg (50 mg Oral Given 10/10/19 1945)  alum & mag hydroxide-simeth (MAALOX/MYLANTA) 200-200-20 MG/5ML suspension 30 mL (30 mLs Oral Given 10/10/19 1945)    ED Course  I have reviewed the triage vital signs and the nursing notes.  Pertinent labs & imaging results that were available during my care of the patient were reviewed by me and considered in my medical decision making (see chart for details).    MDM Rules/Calculators/A&P                      Patient presents to the emergency department with chest pressure and shortness of breath. Patient nontoxic appearing, in no apparent distress, vitals without significant abnormality. Patient is very anxious and tearful during exam. Fairly benign physical exam. DDX: ACS, pulmonary embolism, dissection, pneumothorax, effusion, infiltrate, arrhythmia, anemia, electrolyte derangement, MSK, anxiety. Evaluation initiated with labs, EKG, and CXR. Patient on cardiac monitor. Patient given vistaril for anxiety.  Work-up in the ER unremarkable. Labs reviewed, mild leukocytosis 11.0, no anemia, no significant electrolyte abnormality. D dimer is negative. TSH within normal range. CXR without infiltrate, effusion, pneumothorax, or fracture/dislocation.   Low risk heart score of 1, EKG without obvious ischemia. Troponin is <2.0. Low suspicion for ACS as chest pressure has been present x 2 weeks. Pain is not a tearing sensation, symmetric pulses, no widening of mediastinum on CXR, doubt dissection. On reassessment patient is resting comfortably. She states chest pressure and shortness of breath have resolved.  Will discharge with prescription for short course of vistaril for anxiety prn. Patient has appeared hemodynamically stable throughout ER visit and  appears safe for discharge with close PCP/cardiology follow up. I discussed results, treatment plan, need for PCP follow-up to discuss anxiety, and return precautions with the patient. Recommend she keep cardiology tests as scheduled for later this week.  Provided opportunity for questions, patient confirmed understanding and is in agreement with plan. Patient is requesting covid testing given she has been in the ED 3x recently and is concerned for possible exposure while here. Send out covid test performed, patient aware she should self quarantine until she has test result. Discussed symptomatic care if she does test positive. Findings and plan of care discussed with supervising physician Dr.Zammit.   Heather Fry was evaluated in Emergency Department on 10/11/2019 for the symptoms described in the history of present illness. She was evaluated in the context of the global COVID-19 pandemic, which necessitated consideration that the patient might be at risk for infection with the SARS-CoV-2 virus that causes COVID-19. Institutional protocols and algorithms that pertain to the evaluation of patients at risk for COVID-19 are in a state of rapid change based on information released by regulatory bodies including the CDC and federal and state organizations. These policies and algorithms were followed  during the patient's care in the ED.   Portions of this note were generated with [employer]. Dictation errors may occur despite best attempts at proofreading.   Final Clinical Impression(s) / ED Diagnoses Final diagnoses:  Shortness of breath    Rx / DC Orders ED Discharge Orders         Ordered    hydrOXYzine (ATARAX/VISTARIL) 50 MG tablet  2 times daily PRN     10/10/19 2316           Cherre Robins, PA-C 10/11/19 0054    Milton Ferguson, MD 10/13/19 1107

## 2019-10-11 LAB — SARS CORONAVIRUS 2 (TAT 6-24 HRS): SARS Coronavirus 2: NEGATIVE

## 2019-10-12 ENCOUNTER — Other Ambulatory Visit: Payer: Self-pay

## 2019-10-12 ENCOUNTER — Ambulatory Visit (HOSPITAL_COMMUNITY)
Admission: RE | Admit: 2019-10-12 | Discharge: 2019-10-12 | Disposition: A | Discharge status: Home / Self Care | Payer: BC Managed Care – PPO | Source: Ambulatory Visit | Attending: Cardiology | Admitting: Cardiology

## 2019-10-12 ENCOUNTER — Telehealth: Payer: Self-pay | Admitting: Endocrinology

## 2019-10-12 DIAGNOSIS — R072 Precordial pain: Secondary | ICD-10-CM | POA: Diagnosis not present

## 2019-10-12 MED ORDER — TECHNETIUM TC 99M TETROFOSMIN IV KIT
30.7000 | PACK | Freq: Once | INTRAVENOUS | Status: AC | PRN
  Administered 2019-10-12: 30.7 via INTRAVENOUS
  Filled 2019-10-12: qty 31

## 2019-10-12 MED ORDER — AMINOPHYLLINE 25 MG/ML IV SOLN
100.0000 mg | Freq: Once | INTRAVENOUS | Status: AC
  Administered 2019-10-12: 100 mg via INTRAVENOUS

## 2019-10-12 MED ORDER — REGADENOSON 0.4 MG/5ML IV SOLN
0.4000 mg | Freq: Once | INTRAVENOUS | Status: AC
  Administered 2019-10-12: 0.4 mg via INTRAVENOUS

## 2019-10-12 NOTE — Telephone Encounter (Signed)
Patient called re: Patient has requested that the Referral to the Rheumatologist should be going to Dr. Amil Amen at [facility] (Patient's choice) but instead patient has been referred twice to Dr. Estanislado Pandy at [facility]. Patient states she has called our office three times re: the above matter and that Anderson Malta from Dr. Melissa Noon office sent a fax to our office re: the above on 10/11/19. Please call patient at ph# [phone] to advise her as to what is going on with the above referral.

## 2019-10-12 NOTE — Telephone Encounter (Signed)
Please review pt concern and please call her to further discuss

## 2019-10-13 ENCOUNTER — Ambulatory Visit (INDEPENDENT_AMBULATORY_CARE_PROVIDER_SITE_OTHER): Payer: BC Managed Care – PPO | Admitting: Endocrinology

## 2019-10-13 ENCOUNTER — Encounter: Payer: Self-pay | Admitting: Endocrinology

## 2019-10-13 ENCOUNTER — Ambulatory Visit (HOSPITAL_COMMUNITY)
Admission: RE | Admit: 2019-10-13 | Discharge: 2019-10-13 | Disposition: A | Discharge status: Home / Self Care | Payer: BC Managed Care – PPO | Source: Ambulatory Visit | Attending: Cardiology | Admitting: Cardiology

## 2019-10-13 VITALS — BP 124/72 | HR 93 | Ht 64.0 in | Wt 266.2 lb

## 2019-10-13 DIAGNOSIS — R232 Flushing: Secondary | ICD-10-CM

## 2019-10-13 LAB — MYOCARDIAL PERFUSION IMAGING
LV dias vol: 77 mL (ref 46–106)
LV sys vol: 29 mL
Peak HR: 131 {beats}/min
Rest HR: 88 {beats}/min
SDS: 6
SRS: 1
SSS: 7
TID: 0.89

## 2019-10-13 MED ORDER — TECHNETIUM TC 99M TETROFOSMIN IV KIT
29.6000 | PACK | Freq: Once | INTRAVENOUS | Status: AC | PRN
  Administered 2019-10-13: 29.6 via INTRAVENOUS

## 2019-10-13 MED ORDER — VITAMIN D3 125 MCG (5000 UT) PO CAPS: 5000.0000 [IU] | ORAL_CAPSULE | Freq: Every day | ORAL | 11 refills | Status: DC

## 2019-10-13 NOTE — Telephone Encounter (Signed)
I have sent this the the appropriate office and contacted the patient to make her aware

## 2019-10-13 NOTE — Patient Instructions (Addendum)
Please try decreasing the vitamin-D to 5000 units per day. A 24-HR urine test is requested for you today.  This may be high due to the Lexapro.  Please continue the same other medications.

## 2019-10-13 NOTE — Progress Notes (Signed)
Subjective:    Patient ID: Heather [facility], female    DOB: May 15, 1976, 44 y.o.   MRN: [id number]  HPI Pt returns for f/u of postsurgical hypothyroidism (she had thyroidectomy 1/20, for hyperthyroidism; she was rx'ed synthroid soon thereafter).   She also has mild postsurgical hypocalcemia (she no longer takes Ca++, but she still takes vit-D, 10,000 units/day).   She also has hypomagnesemia (she takes 250 mg qd) She reports ongoing sxs (these have been normal: serum catechols, cortisol, and FSH/LH; she has had TAH but not BSO).   She reports flushing of the face, and jitteriness throughout the body.  She also has bloating of the stomach.   Past Medical History:  Diagnosis Date  . Anxiety   . Arthritis   . Depression   . Diverticulosis   . GERD (gastroesophageal reflux disease)   . Graves disease   . Internal hemorrhoids   . Thyroid disease   . Uterine fibroid     Past Surgical History:  Procedure Laterality Date  . ABDOMINAL HYSTERECTOMY     Per patient, uterine fibroids.  . ABDOMINAL HYSTERECTOMY    . CHOLECYSTECTOMY N/A 11/06/2014   Procedure: LAPAROSCOPIC CHOLECYSTECTOMY;  Surgeon: Jamesetta So, MD;  Location: [facility];  Service: General;  Laterality: N/A;  . COLONOSCOPY N/A 03/04/2017   Dr. Gala Romney: Hemorrhoids, diverticulosis, benign polyp without adenomatous changes.  Next colonoscopy 10 years  . DILATION AND CURETTAGE OF UTERUS    . ESOPHAGOGASTRODUODENOSCOPY N/A 10/02/2014   Dr. Gala Romney: fundic gland polyps, hiatal hernia  . MOUTH SURGERY     extraction of teeth  . POLYPECTOMY  03/04/2017   Procedure: POLYPECTOMY;  Surgeon: Daneil Dolin, MD;  Location: [facility];  Service: Endoscopy;;  colon  . THYROIDECTOMY N/A 09/30/2018   Procedure: TOTAL THYROIDECTOMY;  Surgeon: Armandina Gemma, MD;  Location: [facility];  Service: General;  Laterality: N/A;  . TUBAL LIGATION      Social History   Socioeconomic History  . Marital status: Married    Spouse name: Not on file  . Number of  children: Not on file  . Years of education: Not on file  . Highest education level: Not on file  Occupational History  . Not on file  Tobacco Use  . Smoking status: Former Smoker    Packs/day: 0.50    Years: 3.00    Pack years: 1.50    Types: Cigarettes    Quit date: 11/03/1997    Years since quitting: 21.9  . Smokeless tobacco: Never Used  . Tobacco comment: 1 pack 1 week  Substance and Sexual Activity  . Alcohol use: No    Alcohol/week: 0.0 standard drinks  . Drug use: No  . Sexual activity: Yes    Birth control/protection: Surgical  Other Topics Concern  . Not on file  Social History Narrative  . Not on file   Social Determinants of Health   Financial Resource Strain:   . Difficulty of Paying Living Expenses: Not on file  Food Insecurity:   . Worried About Charity fundraiser in the Last Year: Not on file  . Ran Out of Food in the Last Year: Not on file  Transportation Needs:   . Lack of Transportation (Medical): Not on file  . Lack of Transportation (Non-Medical): Not on file  Physical Activity:   . Days of Exercise per Week: Not on file  . Minutes of Exercise per Session: Not on file  Stress:   . Feeling  of Stress : Not on file  Social Connections:   . Frequency of Communication with Friends and Family: Not on file  . Frequency of Social Gatherings with Friends and Family: Not on file  . Attends Religious Services: Not on file  . Active Member of Clubs or Organizations: Not on file  . Attends Archivist Meetings: Not on file  . Marital Status: Not on file  Intimate Partner Violence:   . Fear of Current or Ex-Partner: Not on file  . Emotionally Abused: Not on file  . Physically Abused: Not on file  . Sexually Abused: Not on file    Current Outpatient Medications on File Prior to Visit  Medication Sig Dispense Refill  . acetaminophen (TYLENOL) 500 MG tablet Take 1,000 mg by mouth daily as needed for moderate pain.    Marland Kitchen docusate sodium (COLACE) 100  MG capsule Take 100 mg by mouth at bedtime.    Marland Kitchen escitalopram (LEXAPRO) 20 MG tablet Take 1 tablet (20 mg total) by mouth at bedtime. 30 tablet 5  . Magnesium 250 MG TABS Take 1 tablet by mouth daily.    . NON FORMULARY at bedtime. CPAP at [facility]     . omeprazole (PRILOSEC) 20 MG capsule Take 20 mg by mouth 2 (two) times daily before a meal.    . simethicone (PHAZYME) 125 MG chewable tablet Chew 125-250 mg by mouth every 6 (six) hours as needed for flatulence.     Marland Kitchen SYNTHROID 150 MCG tablet Take 1 tablet (150 mcg total) by mouth daily before breakfast. 90 tablet 3  . Wheat Dextrin (BENEFIBER DRINK MIX PO) Take 1 Dose by mouth 3 (three) times daily.      No current facility-administered medications on file prior to visit.    Allergies  Allergen Reactions  . Levofloxacin Other (See Comments)    Pt can not have due to taking Lexapro   . Toradol [Ketorolac Tromethamine] Anaphylaxis  . Penicillins Other (See Comments)    Loopy, childhood allergy DID THE REACTION INVOLVE: Swelling of the face/tongue/throat, SOB, or low BP? Unknown Sudden or severe rash/hives, skin peeling, or the inside of the mouth or nose? Unknown Did it require medical treatment? Yes When did it last happen?Childhood allergy If all above answers are "NO", may proceed with cephalosporin use.     Family History  Problem Relation Age of Onset  . Hypertension Father   . Diabetes Father   . Depression Other   . Uterine cancer Mother        ?cervical cancer  . Uterine cancer Sister        suicide  . Colon cancer Paternal Aunt 26  . Thyroid disease Neg Hx     BP 124/72 (BP Location: Left Arm, Patient Position: Sitting, Cuff Size: Large)   Pulse 93   Ht 5\' 4"  (1.626 m)   Wt 266 lb 3.2 oz (120.7 kg)   LMP 10/09/2014   SpO2 98%   BMI 45.69 kg/m    Review of Systems Denies fever.     Objective:   Physical Exam VITAL SIGNS:  See vs page GENERAL: no distress Ext: no leg edema.    Lab Results    Component Value Date   TSH 2.416 10/10/2019      Assessment & Plan:  Flushing, new Vit-D def: overcontrolled Postsurgical hypothyroidism: well-replaced  Patient Instructions  Please try decreasing the vitamin-D to 5000 units per day. A 24-HR urine test is requested for you today.  This may be high due to the Lexapro.  Please continue the same other medications.

## 2019-10-14 ENCOUNTER — Other Ambulatory Visit (HOSPITAL_COMMUNITY): Payer: BC Managed Care – PPO

## 2019-10-19 ENCOUNTER — Ambulatory Visit (HOSPITAL_COMMUNITY): Payer: BC Managed Care – PPO | Attending: Cardiovascular Disease

## 2019-10-19 ENCOUNTER — Other Ambulatory Visit: Payer: Self-pay

## 2019-10-19 DIAGNOSIS — R072 Precordial pain: Secondary | ICD-10-CM | POA: Diagnosis not present

## 2019-10-20 DIAGNOSIS — R21 Rash and other nonspecific skin eruption: Secondary | ICD-10-CM | POA: Diagnosis not present

## 2019-10-20 DIAGNOSIS — M2550 Pain in unspecified joint: Secondary | ICD-10-CM | POA: Diagnosis not present

## 2019-10-20 DIAGNOSIS — M7989 Other specified soft tissue disorders: Secondary | ICD-10-CM | POA: Diagnosis not present

## 2019-10-20 DIAGNOSIS — M791 Myalgia, unspecified site: Secondary | ICD-10-CM | POA: Diagnosis not present

## 2019-10-28 DIAGNOSIS — G4733 Obstructive sleep apnea (adult) (pediatric): Secondary | ICD-10-CM | POA: Diagnosis not present

## 2019-11-01 ENCOUNTER — Other Ambulatory Visit: Payer: Self-pay

## 2019-11-01 ENCOUNTER — Other Ambulatory Visit: Payer: Self-pay | Admitting: Endocrinology

## 2019-11-01 ENCOUNTER — Ambulatory Visit (INDEPENDENT_AMBULATORY_CARE_PROVIDER_SITE_OTHER): Payer: BC Managed Care – PPO | Admitting: Endocrinology

## 2019-11-01 ENCOUNTER — Encounter: Payer: Self-pay | Admitting: Endocrinology

## 2019-11-01 VITALS — BP 130/82 | HR 91 | Ht 64.0 in | Wt 269.4 lb

## 2019-11-01 DIAGNOSIS — E890 Postprocedural hypothyroidism: Secondary | ICD-10-CM | POA: Diagnosis not present

## 2019-11-01 DIAGNOSIS — E538 Deficiency of other specified B group vitamins: Secondary | ICD-10-CM

## 2019-11-01 DIAGNOSIS — R739 Hyperglycemia, unspecified: Secondary | ICD-10-CM

## 2019-11-01 DIAGNOSIS — E559 Vitamin D deficiency, unspecified: Secondary | ICD-10-CM

## 2019-11-01 LAB — BASIC METABOLIC PANEL
BUN: 10 mg/dL (ref 6–23)
CO2: 29 mEq/L (ref 19–32)
Calcium: 9.6 mg/dL (ref 8.4–10.5)
Chloride: 103 mEq/L (ref 96–112)
Creatinine, Ser: 0.76 mg/dL (ref 0.40–1.20)
GFR: 82.8 mL/min (ref >60.00)
Glucose, Bld: 132 mg/dL — ABNORMAL HIGH (ref 70–99)
Potassium: 4.2 mEq/L (ref 3.5–5.1)
Sodium: 139 mEq/L (ref 135–145)

## 2019-11-01 LAB — MAGNESIUM: Magnesium: 1.9 mg/dL (ref 1.5–2.5)

## 2019-11-01 LAB — VITAMIN D 25 HYDROXY (VIT D DEFICIENCY, FRACTURES): VITD: 50.72 ng/mL (ref 30.00–100.00)

## 2019-11-01 LAB — VITAMIN B12: Vitamin B-12: 371 pg/mL (ref 211–911)

## 2019-11-01 LAB — TSH: TSH: 0.59 u[IU]/mL (ref 0.35–4.50)

## 2019-11-01 LAB — PHOSPHORUS: Phosphorus: 3.1 mg/dL (ref 2.3–4.6)

## 2019-11-01 LAB — T4, FREE: Free T4: 0.83 ng/dL (ref 0.60–1.60)

## 2019-11-01 NOTE — Patient Instructions (Addendum)
Blood tests are requested for you today.  We'll let you know about the results.  Please come back for a follow-up appointment in 6 months.   

## 2019-11-01 NOTE — Progress Notes (Signed)
Subjective:    Patient ID: Heather [facility], female    DOB: 28-Mar-1976, 44 y.o.   MRN: [id number]  HPI Pt returns for f/u of postsurgical hypothyroidism (she had thyroidectomy 1/20, for hyperthyroidism; she was rx'ed synthroid soon thereafter).   She also has mild postsurgical hypoparathyroidism (she no longer takes Ca++, but she takes vit-D, 5000 units/day).   She also has hypomagnesemia (she takes 250 mg qd).   She reports ongoing sxs (these have been normal: serum catechols, cortisol, and FSH/LH; she has had TAH but not BSO).  She says the flushing has been determined to be from SLE.    She stopped B-12 oral tab, due to improvement.   Past Medical History:  Diagnosis Date  . Anxiety   . Arthritis   . Depression   . Diverticulosis   . GERD (gastroesophageal reflux disease)   . Graves disease   . Internal hemorrhoids   . Thyroid disease   . Uterine fibroid     Past Surgical History:  Procedure Laterality Date  . ABDOMINAL HYSTERECTOMY     Per patient, uterine fibroids.  . ABDOMINAL HYSTERECTOMY    . CHOLECYSTECTOMY N/A 11/06/2014   Procedure: LAPAROSCOPIC CHOLECYSTECTOMY;  Surgeon: Jamesetta So, MD;  Location: [facility];  Service: General;  Laterality: N/A;  . COLONOSCOPY N/A 03/04/2017   Dr. Gala Romney: Hemorrhoids, diverticulosis, benign polyp without adenomatous changes.  Next colonoscopy 10 years  . DILATION AND CURETTAGE OF UTERUS    . ESOPHAGOGASTRODUODENOSCOPY N/A 10/02/2014   Dr. Gala Romney: fundic gland polyps, hiatal hernia  . MOUTH SURGERY     extraction of teeth  . POLYPECTOMY  03/04/2017   Procedure: POLYPECTOMY;  Surgeon: Daneil Dolin, MD;  Location: [facility];  Service: Endoscopy;;  colon  . THYROIDECTOMY N/A 09/30/2018   Procedure: TOTAL THYROIDECTOMY;  Surgeon: Armandina Gemma, MD;  Location: [facility];  Service: General;  Laterality: N/A;  . TUBAL LIGATION      Social History   Socioeconomic History  . Marital status: Married    Spouse name: Not on file  . Number of  children: Not on file  . Years of education: Not on file  . Highest education level: Not on file  Occupational History  . Not on file  Tobacco Use  . Smoking status: Former Smoker    Packs/day: 0.50    Years: 3.00    Pack years: 1.50    Types: Cigarettes    Quit date: 11/03/1997    Years since quitting: 22.0  . Smokeless tobacco: Never Used  . Tobacco comment: 1 pack 1 week  Substance and Sexual Activity  . Alcohol use: No    Alcohol/week: 0.0 standard drinks  . Drug use: No  . Sexual activity: Yes    Birth control/protection: Surgical  Other Topics Concern  . Not on file  Social History Narrative  . Not on file   Social Determinants of Health   Financial Resource Strain:   . Difficulty of Paying Living Expenses: Not on file  Food Insecurity:   . Worried About Charity fundraiser in the Last Year: Not on file  . Ran Out of Food in the Last Year: Not on file  Transportation Needs:   . Lack of Transportation (Medical): Not on file  . Lack of Transportation (Non-Medical): Not on file  Physical Activity:   . Days of Exercise per Week: Not on file  . Minutes of Exercise per Session: Not on file  Stress:   .  Feeling of Stress : Not on file  Social Connections:   . Frequency of Communication with Friends and Family: Not on file  . Frequency of Social Gatherings with Friends and Family: Not on file  . Attends Religious Services: Not on file  . Active Member of Clubs or Organizations: Not on file  . Attends Archivist Meetings: Not on file  . Marital Status: Not on file  Intimate Partner Violence:   . Fear of Current or Ex-Partner: Not on file  . Emotionally Abused: Not on file  . Physically Abused: Not on file  . Sexually Abused: Not on file    Current Outpatient Medications on File Prior to Visit  Medication Sig Dispense Refill  . acetaminophen (TYLENOL) 500 MG tablet Take 1,000 mg by mouth daily as needed for moderate pain.    . Cholecalciferol (VITAMIN D3)  125 MCG (5000 UT) CAPS Take 1 capsule (5,000 Units total) by mouth daily. 30 capsule 11  . docusate sodium (COLACE) 100 MG capsule Take 100 mg by mouth at bedtime.    Marland Kitchen escitalopram (LEXAPRO) 20 MG tablet Take 1 tablet (20 mg total) by mouth at bedtime. 30 tablet 5  . Magnesium 250 MG TABS Take 1 tablet by mouth daily.    . NON FORMULARY at bedtime. CPAP at [facility]     . omeprazole (PRILOSEC) 20 MG capsule Take 20 mg by mouth 2 (two) times daily before a meal.    . simethicone (PHAZYME) 125 MG chewable tablet Chew 125-250 mg by mouth every 6 (six) hours as needed for flatulence.     Marland Kitchen SYNTHROID 150 MCG tablet Take 1 tablet (150 mcg total) by mouth daily before breakfast. 90 tablet 3  . Wheat Dextrin (BENEFIBER DRINK MIX PO) Take 1 Dose by mouth 3 (three) times daily.      No current facility-administered medications on file prior to visit.    Allergies  Allergen Reactions  . Levofloxacin Other (See Comments)    Pt can not have due to taking Lexapro   . Toradol [Ketorolac Tromethamine] Anaphylaxis  . Penicillins Other (See Comments)    Loopy, childhood allergy DID THE REACTION INVOLVE: Swelling of the face/tongue/throat, SOB, or low BP? Unknown Sudden or severe rash/hives, skin peeling, or the inside of the mouth or nose? Unknown Did it require medical treatment? Yes When did it last happen?Childhood allergy If all above answers are "NO", may proceed with cephalosporin use.     Family History  Problem Relation Age of Onset  . Hypertension Father   . Diabetes Father   . Depression Other   . Uterine cancer Mother        ?cervical cancer  . Uterine cancer Sister        suicide  . Colon cancer Paternal Aunt 46  . Thyroid disease Neg Hx     BP 130/82 (BP Location: Left Wrist, Patient Position: Sitting, Cuff Size: Large)   Pulse 91   Ht 5\' 4"  (1.626 m)   Wt 269 lb 6.4 oz (122.2 kg)   LMP 10/09/2014   SpO2 98%   BMI 46.24 kg/m    Review of Systems Denies diarrhea      Objective:   Physical Exam VITAL SIGNS:  See vs page GENERAL: no distress Neck: a healed scar is present.  I do not appreciate a nodule in the thyroid or elsewhere in the neck.   Lab Results  Component Value Date   TSH 0.59 11/01/2019   Lab  Results  Component Value Date   PTH 16 11/01/2019   CALCIUM 9.6 11/01/2019   CALCIUM CANCELED 11/01/2019   CAION 1.15 10/09/2018   PHOS 3.1 11/01/2019       Assessment & Plan:  Hypothyroidism: well-replaced.  Please continue the same medication B-12 def: recheck today Hypoparathyroidism: much better.  No rx is needed.  Patient Instructions  Blood tests are requested for you today.  We'll let you know about the results.  Please come back for a follow-up appointment in 6 months.

## 2019-11-02 LAB — PTH, INTACT AND CALCIUM: Calcium: 9.7 mg/dL (ref 8.7–10.2)

## 2019-11-02 LAB — HEMOGLOBIN A1C: Hgb A1c MFr Bld: 6.3 % (ref 4.6–6.5)

## 2019-11-03 LAB — PTH, INTACT AND CALCIUM: PTH: 16 pg/mL (ref 14–64)

## 2019-11-09 DIAGNOSIS — Z01818 Encounter for other preprocedural examination: Secondary | ICD-10-CM | POA: Diagnosis not present

## 2019-11-15 DIAGNOSIS — M2550 Pain in unspecified joint: Secondary | ICD-10-CM | POA: Diagnosis not present

## 2019-11-15 DIAGNOSIS — R768 Other specified abnormal immunological findings in serum: Secondary | ICD-10-CM | POA: Diagnosis not present

## 2019-11-15 DIAGNOSIS — R700 Elevated erythrocyte sedimentation rate: Secondary | ICD-10-CM | POA: Diagnosis not present

## 2019-11-15 DIAGNOSIS — R21 Rash and other nonspecific skin eruption: Secondary | ICD-10-CM | POA: Diagnosis not present

## 2019-11-19 DIAGNOSIS — R197 Diarrhea, unspecified: Secondary | ICD-10-CM | POA: Diagnosis not present

## 2019-11-19 DIAGNOSIS — Z880 Allergy status to penicillin: Secondary | ICD-10-CM | POA: Diagnosis not present

## 2019-11-19 DIAGNOSIS — E039 Hypothyroidism, unspecified: Secondary | ICD-10-CM | POA: Diagnosis not present

## 2019-11-19 DIAGNOSIS — Z79899 Other long term (current) drug therapy: Secondary | ICD-10-CM | POA: Diagnosis not present

## 2019-11-19 DIAGNOSIS — Z9851 Tubal ligation status: Secondary | ICD-10-CM | POA: Diagnosis not present

## 2019-11-19 DIAGNOSIS — Z20822 Contact with and (suspected) exposure to covid-19: Secondary | ICD-10-CM | POA: Diagnosis not present

## 2019-11-19 DIAGNOSIS — Z886 Allergy status to analgesic agent status: Secondary | ICD-10-CM | POA: Diagnosis not present

## 2019-11-19 DIAGNOSIS — Z87891 Personal history of nicotine dependence: Secondary | ICD-10-CM | POA: Diagnosis not present

## 2019-11-19 DIAGNOSIS — Z9049 Acquired absence of other specified parts of digestive tract: Secondary | ICD-10-CM | POA: Diagnosis not present

## 2019-11-19 DIAGNOSIS — U071 COVID-19: Secondary | ICD-10-CM | POA: Diagnosis not present

## 2019-11-19 DIAGNOSIS — Z881 Allergy status to other antibiotic agents status: Secondary | ICD-10-CM | POA: Diagnosis not present

## 2019-11-19 DIAGNOSIS — R531 Weakness: Secondary | ICD-10-CM | POA: Diagnosis not present

## 2019-11-19 DIAGNOSIS — R110 Nausea: Secondary | ICD-10-CM | POA: Diagnosis not present

## 2019-11-19 DIAGNOSIS — Z90710 Acquired absence of both cervix and uterus: Secondary | ICD-10-CM | POA: Diagnosis not present

## 2019-11-19 DIAGNOSIS — K219 Gastro-esophageal reflux disease without esophagitis: Secondary | ICD-10-CM | POA: Diagnosis not present

## 2019-11-19 DIAGNOSIS — R109 Unspecified abdominal pain: Secondary | ICD-10-CM | POA: Diagnosis not present

## 2019-11-30 DIAGNOSIS — I10 Essential (primary) hypertension: Secondary | ICD-10-CM | POA: Diagnosis not present

## 2019-11-30 DIAGNOSIS — Z6841 Body Mass Index (BMI) 40.0 and over, adult: Secondary | ICD-10-CM | POA: Diagnosis not present

## 2019-11-30 DIAGNOSIS — Z1389 Encounter for screening for other disorder: Secondary | ICD-10-CM | POA: Diagnosis not present

## 2019-11-30 DIAGNOSIS — K219 Gastro-esophageal reflux disease without esophagitis: Secondary | ICD-10-CM | POA: Diagnosis not present

## 2019-11-30 DIAGNOSIS — E063 Autoimmune thyroiditis: Secondary | ICD-10-CM | POA: Diagnosis not present

## 2019-11-30 DIAGNOSIS — M353 Polymyalgia rheumatica: Secondary | ICD-10-CM | POA: Diagnosis not present

## 2019-11-30 DIAGNOSIS — E669 Obesity, unspecified: Secondary | ICD-10-CM | POA: Diagnosis not present

## 2019-12-02 ENCOUNTER — Other Ambulatory Visit: Payer: Self-pay

## 2019-12-04 ENCOUNTER — Emergency Department (HOSPITAL_COMMUNITY): Payer: BC Managed Care – PPO

## 2019-12-04 ENCOUNTER — Encounter (HOSPITAL_COMMUNITY): Payer: Self-pay | Admitting: Emergency Medicine

## 2019-12-04 ENCOUNTER — Other Ambulatory Visit: Payer: Self-pay

## 2019-12-04 ENCOUNTER — Emergency Department (HOSPITAL_COMMUNITY)
Admission: EM | Admit: 2019-12-04 | Discharge: 2019-12-05 | Disposition: A | Discharge status: Home / Self Care | Payer: BC Managed Care – PPO | Attending: Emergency Medicine | Admitting: Emergency Medicine

## 2019-12-04 DIAGNOSIS — R0602 Shortness of breath: Secondary | ICD-10-CM | POA: Diagnosis not present

## 2019-12-04 DIAGNOSIS — Z87891 Personal history of nicotine dependence: Secondary | ICD-10-CM | POA: Diagnosis not present

## 2019-12-04 DIAGNOSIS — R069 Unspecified abnormalities of breathing: Secondary | ICD-10-CM | POA: Diagnosis not present

## 2019-12-04 DIAGNOSIS — Z79899 Other long term (current) drug therapy: Secondary | ICD-10-CM | POA: Diagnosis not present

## 2019-12-04 DIAGNOSIS — R42 Dizziness and giddiness: Secondary | ICD-10-CM | POA: Diagnosis not present

## 2019-12-04 DIAGNOSIS — R0689 Other abnormalities of breathing: Secondary | ICD-10-CM | POA: Diagnosis not present

## 2019-12-04 LAB — CBC
HCT: 39.1 % (ref 36.0–46.0)
Hemoglobin: 12.5 g/dL (ref 12.0–15.0)
MCH: 29.3 pg (ref 26.0–34.0)
MCHC: 32 g/dL (ref 30.0–36.0)
MCV: 91.6 fL (ref 80.0–100.0)
Platelets: 196 10*3/uL (ref 150–400)
RBC: 4.27 MIL/uL (ref 3.87–5.11)
RDW: 14 % (ref 11.5–15.5)
WBC: 11.7 10*3/uL — ABNORMAL HIGH (ref 4.0–10.5)
nRBC: 0 % (ref 0.0–0.2)

## 2019-12-04 LAB — COMPREHENSIVE METABOLIC PANEL
ALT: 25 U/L (ref 0–44)
AST: 17 U/L (ref 15–41)
Albumin: 3.8 g/dL (ref 3.5–5.0)
Alkaline Phosphatase: 52 U/L (ref 38–126)
Anion gap: 11 (ref 5–15)
BUN: 14 mg/dL (ref 6–20)
CO2: 23 mmol/L (ref 22–32)
Calcium: 8.7 mg/dL — ABNORMAL LOW (ref 8.9–10.3)
Chloride: 101 mmol/L (ref 98–111)
Creatinine, Ser: 0.87 mg/dL (ref 0.44–1.00)
GFR calc Af Amer: >60 mL/min (ref >60)
GFR calc non Af Amer: >60 mL/min (ref >60)
Glucose, Bld: 145 mg/dL — ABNORMAL HIGH (ref 70–99)
Potassium: 3.7 mmol/L (ref 3.5–5.1)
Sodium: 135 mmol/L (ref 135–145)
Total Bilirubin: 0.3 mg/dL (ref 0.3–1.2)
Total Protein: 7.4 g/dL (ref 6.5–8.1)

## 2019-12-04 LAB — D-DIMER, QUANTITATIVE: D-Dimer, Quant: 0.28 ug/mL-FEU (ref 0.00–0.50)

## 2019-12-04 IMAGING — DX DG CHEST 1V PORT
1 series · 1 of 1 positions shown · non-contrast
Comparison: Radiograph [DATE]

CLINICAL DATA: Shortness of breath

EXAM:
PORTABLE CHEST 1 VIEW

[chest ap]
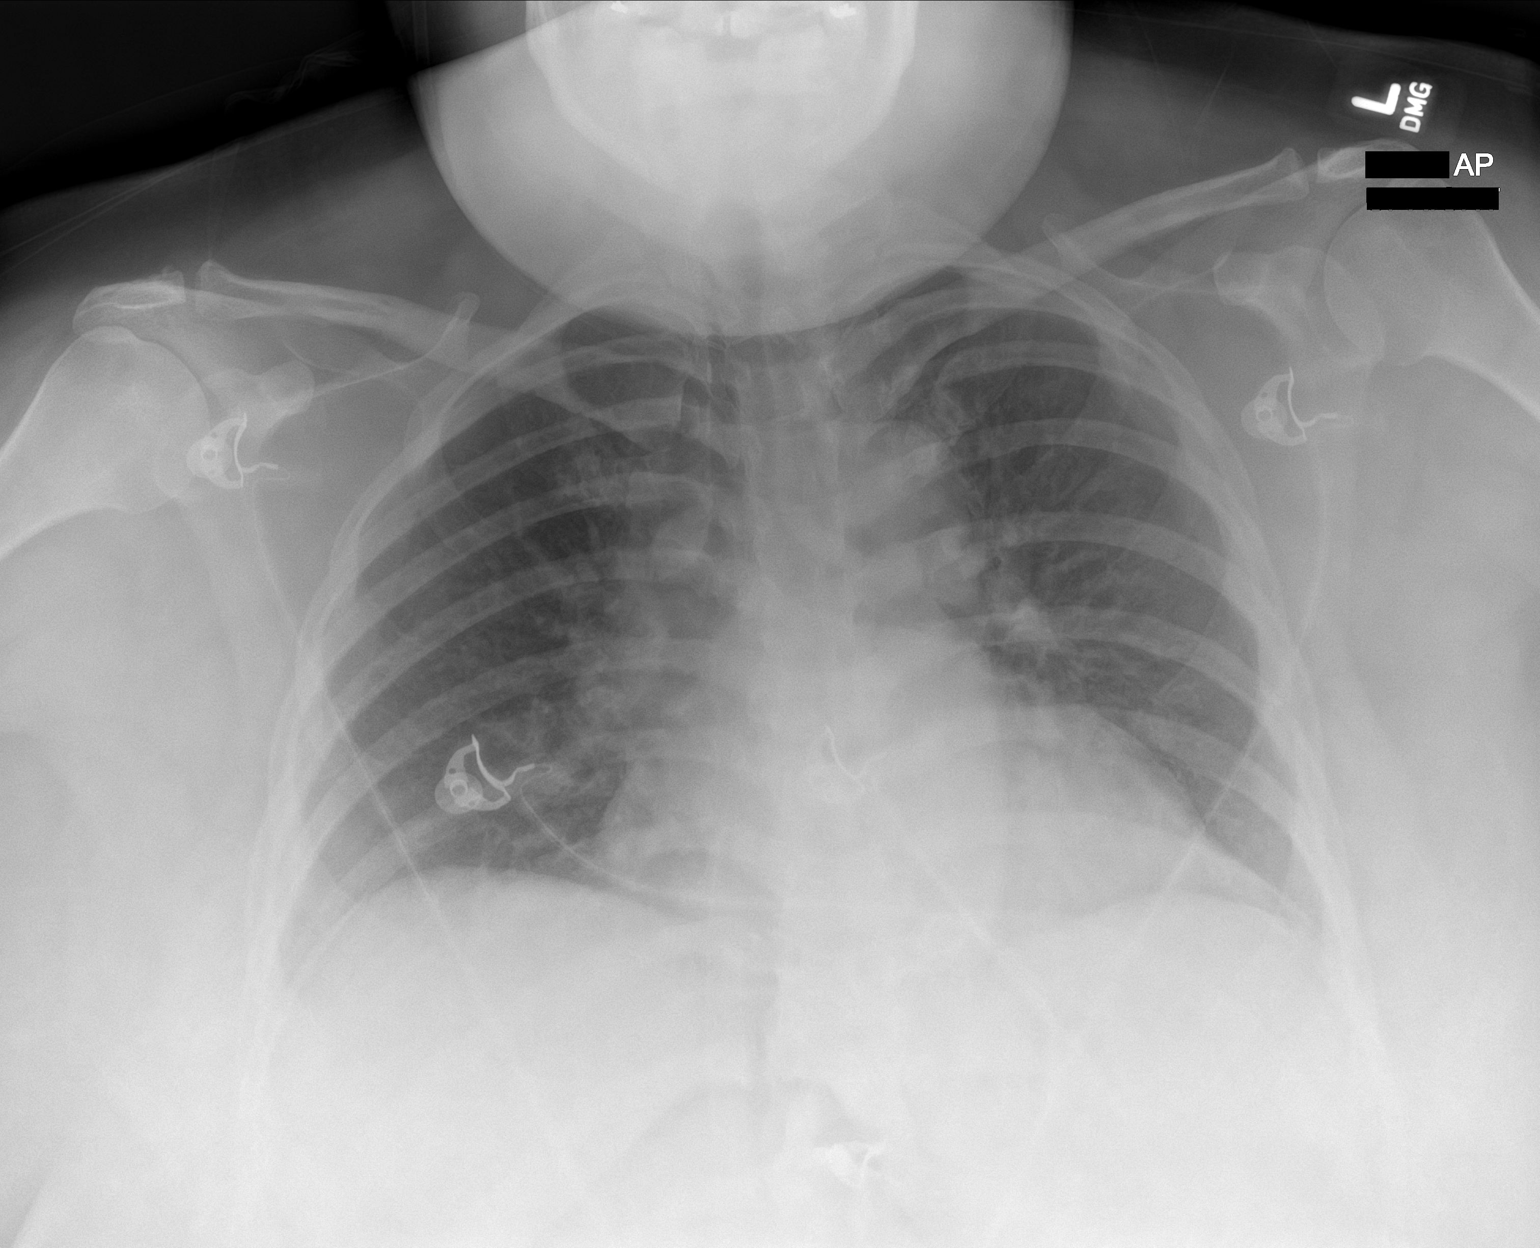

[1 of 1 positions shown; findings below may reference images not displayed]

FINDINGS: Low lung volumes with central vascular crowding and some hazy
atelectatic changes. No consolidation, features of edema,
pneumothorax, or effusion. Prominence of the cardiomediastinal
contours likely related to low volumes and portable technique. No
gross abnormality is seen. No acute osseous or soft tissue
abnormality.
IMPRESSION: Low volumes and atelectasis.  No focal consolidative opacity.

## 2019-12-04 MED ORDER — AEROCHAMBER Z-STAT PLUS/MEDIUM MISC
1.0000 | Freq: Once | Status: AC
  Administered 2019-12-04: 1
  Filled 2019-12-04: qty 1

## 2019-12-04 MED ORDER — ALBUTEROL SULFATE HFA 108 (90 BASE) MCG/ACT IN AERS
8.0000 | INHALATION_SPRAY | Freq: Once | RESPIRATORY_TRACT | Status: AC
  Administered 2019-12-04: 8 via RESPIRATORY_TRACT
  Filled 2019-12-04: qty 6.7

## 2019-12-04 NOTE — ED Triage Notes (Signed)
Patient brought in by EMS for shortness of breath. 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 Patient conscious and alert. Oxygen saturations are 100 percent on 2 liters . Patient states that she feels it is hard to breath and that her throat feels tight. Patient did use a inhaler at home.

## 2019-12-05 DIAGNOSIS — M2550 Pain in unspecified joint: Secondary | ICD-10-CM | POA: Diagnosis not present

## 2019-12-05 DIAGNOSIS — Z79899 Other long term (current) drug therapy: Secondary | ICD-10-CM | POA: Diagnosis not present

## 2019-12-05 DIAGNOSIS — R700 Elevated erythrocyte sedimentation rate: Secondary | ICD-10-CM | POA: Diagnosis not present

## 2019-12-05 DIAGNOSIS — R21 Rash and other nonspecific skin eruption: Secondary | ICD-10-CM | POA: Diagnosis not present

## 2019-12-05 DIAGNOSIS — R0602 Shortness of breath: Secondary | ICD-10-CM | POA: Diagnosis not present

## 2019-12-05 DIAGNOSIS — R768 Other specified abnormal immunological findings in serum: Secondary | ICD-10-CM | POA: Diagnosis not present

## 2019-12-05 DIAGNOSIS — D8989 Other specified disorders involving the immune mechanism, not elsewhere classified: Secondary | ICD-10-CM | POA: Diagnosis not present

## 2019-12-05 DIAGNOSIS — Z87891 Personal history of nicotine dependence: Secondary | ICD-10-CM | POA: Diagnosis not present

## 2019-12-05 LAB — BRAIN NATRIURETIC PEPTIDE: B Natriuretic Peptide: 24 pg/mL (ref 0.0–100.0)

## 2019-12-05 LAB — TROPONIN I (HIGH SENSITIVITY)
Troponin I (High Sensitivity): 2 ng/L (ref <18)
Troponin I (High Sensitivity): <2 ng/L (ref <18)

## 2019-12-05 LAB — TSH: TSH: 3.291 u[IU]/mL (ref 0.350–4.500)

## 2019-12-05 NOTE — Discharge Instructions (Addendum)
Use the inhaler 2 puffs every 4 hours with the spacer as needed for shortness of breath.  Please call [facility] to have your shortness of breath evaluated.  Continue your steroids that you were prescribed for your connective tissue disorder.  Return to the emergency department if you get a fever, worsening cough, or you are unable to breathe.

## 2019-12-05 NOTE — ED Provider Notes (Addendum)
[facility] EMERGENCY DEPARTMENT Provider Note   CSN: [id number] Arrival date & time: 12/04/19  2220   Time seen 11:20 PM  History Chief Complaint  Patient presents with  . Shortness of Breath    Heather Fry is a 44 y.o. female.  HPI   Patient states this evening she felt like her throat was closing off and she could not breathe.  She has been having symptoms for a few days.  She states she is having rhinorrhea and sneezing which she attributes to her allergies and she started taking Claritin again.  She states she feels raw in her throat and chest today.  She saw her primary care doctor on March 3 and he advised her to try Primatene Mist over-the-counter.  He thought she was having asthma symptoms.  She states it initially helped but tonight she tried it 5 times without relief.  She states sometimes her chest gets tight and it lasts a few seconds.  She denies any change in her speech when she gets these episodes.  She states tonight when it happened she started having a panic attack.  She states she sometimes has a cough with some mucus that stays in her throat.  She has had clear rhinorrhea.  She denies fever.  She has had sore throat for 2 days.  She is not sure but thinks she has been wheezing.  She states she used to smoke but quit 7 or 8 years ago and started smoking again for a few months and totally quit 2 years ago.  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  She is being treated with Medrol twice a day for the past 3 weeks.  She states she was having weakness and fatigue that is improving, she also states her joints and muscles were painful and that is better, and she was having shortness of breath.  Patient states she has severe obstructive sleep apnea and uses CPAP at night.  She states she had a negative Covid test done about 2 weeks ago at [facility] drug.  Patient recently had an evaluation  by cardiology and had a normal nuclear medicine stress test and a normal echocardiogram.  PCP Redmond School, MD   Past Medical History:  Diagnosis Date  . Anxiety   . Arthritis   . Depression   . Diverticulosis   . GERD (gastroesophageal reflux disease)   . Graves disease   . Internal hemorrhoids   . Thyroid disease   . Uterine fibroid     Patient Active Problem List   Diagnosis Date Noted  . Hyperglycemia 10/04/2019  . Myalgia 09/05/2019  . Vitamin B 12 deficiency 07/28/2019  . Numbness 06/29/2019  . Constipation 06/15/2019  . Elevated lipase 06/15/2019  . Hypomagnesemia 03/29/2019  . Leukocytosis 03/29/2019  . Abdominal pain 02/10/2019  . Nausea without vomiting 02/10/2019  . Tingling of face 11/30/2018  . Flushing 11/30/2018  . Vitamin D deficiency 10/06/2018  . GERD (gastroesophageal reflux disease) 01/28/2017  . Rectal bleeding 01/28/2017  . 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 Hypothyroidism 04/14/2016  . Gastric polyp   . RUQ abdominal pain 09/06/2014  . Excessive or frequent menstruation 01/13/2013  . Depression     Past Surgical History:  Procedure Laterality Date  . ABDOMINAL HYSTERECTOMY     Per patient, uterine fibroids.  . ABDOMINAL HYSTERECTOMY    . CHOLECYSTECTOMY N/A 11/06/2014   Procedure: LAPAROSCOPIC CHOLECYSTECTOMY;  Surgeon: Jamesetta So, MD;  Location: [facility] ORS;  Service: General;  Laterality: N/A;  . COLONOSCOPY N/A 03/04/2017   Dr. Gala Romney: Hemorrhoids, diverticulosis, benign polyp without adenomatous changes.  Next colonoscopy 10 years  . DILATION AND CURETTAGE OF UTERUS    . ESOPHAGOGASTRODUODENOSCOPY N/A 10/02/2014   Dr. Gala Romney: fundic gland polyps, hiatal hernia  . MOUTH SURGERY     extraction of teeth  . POLYPECTOMY  03/04/2017   Procedure: POLYPECTOMY;  Surgeon: Daneil Dolin, MD;  Location: [facility] SUITE;  Service: Endoscopy;;  colon  . THYROIDECTOMY N/A 09/30/2018   Procedure: TOTAL THYROIDECTOMY;  Surgeon: Armandina Gemma, MD;  Location: [facility]  ORS;  Service: General;  Laterality: N/A;  . TUBAL LIGATION       OB History    Gravida  2   Para  2   Term  2   Preterm      AB      Living  2     SAB      TAB      Ectopic      Multiple      Live Births              Family History  Problem Relation Age of Onset  . Hypertension Father   . Diabetes Father   . Depression Other   . Uterine cancer Mother        ?cervical cancer  . Uterine cancer Sister        suicide  . Colon cancer Paternal Aunt 78  . Thyroid disease Neg Hx     Social History   Tobacco Use  . Smoking status: Former Smoker    Packs/day: 0.50    Years: 3.00    Pack years: 1.50    Types: Cigarettes    Quit date: 11/03/1997    Years since quitting: 22.1  . Smokeless tobacco: Never Used  . Tobacco comment: 1 pack 1 week  Substance Use Topics  . Alcohol use: No    Alcohol/week: 0.0 standard drinks  . Drug use: No  lives with spouse  Home Medications Prior to Admission medications   Medication Sig Start Date End Date Taking? Authorizing Provider  acetaminophen (TYLENOL) 500 MG tablet Take 1,000 mg by mouth daily as needed for moderate pain.    [provider]  Cholecalciferol (VITAMIN D3) 125 MCG (5000 UT) CAPS Take 1 capsule (5,000 Units total) by mouth daily. 10/13/19   Renato Shin, MD  docusate sodium (COLACE) 100 MG capsule Take 100 mg by mouth at bedtime.    [provider]  escitalopram (LEXAPRO) 20 MG tablet Take 1 tablet (20 mg total) by mouth at bedtime. 01/26/19   Renato Shin, MD  Magnesium 250 MG TABS Take 1 tablet by mouth daily.    [provider]  NON FORMULARY at bedtime. CPAP at night     [provider]  omeprazole (PRILOSEC) 20 MG capsule Take 20 mg by mouth 2 (two) times daily before a meal.    [provider]  simethicone (PHAZYME) 125 MG chewable tablet Chew 125-250 mg by mouth every 6 (six) hours as needed for flatulence.     [provider]  SYNTHROID 150 MCG  tablet Take 1 tablet (150 mcg total) by mouth daily before breakfast. 10/04/19   Renato Shin, MD  Wheat Dextrin (BENEFIBER DRINK MIX PO) Take 1 Dose by mouth 3 (three) times daily.     [provider]  Allergies    Levofloxacin, Toradol [ketorolac tromethamine], and Penicillins  Review of Systems   Review of Systems  All other systems reviewed and are negative.   Physical Exam Updated Vital Signs BP (!) 155/91   Pulse 96   Temp 98.4 F (36.9 C) (Oral)   Resp 15   Ht 5\' 3"  (1.6 m)   Wt 117.9 kg   LMP 10/09/2014   SpO2 98%   BMI 46.06 kg/m   Physical Exam Vitals and nursing note reviewed.  Constitutional:      General: She is not in acute distress.    Appearance: Normal appearance. She is well-developed. She is obese. She is not ill-appearing or toxic-appearing.  HENT:     Head: Normocephalic and atraumatic.     Right Ear: External ear normal.     Left Ear: External ear normal.     Nose: Nose normal. No mucosal edema or rhinorrhea.     Mouth/Throat:     Dentition: No dental abscesses.     Pharynx: No uvula swelling.  Eyes:     Conjunctiva/sclera: Conjunctivae normal.     Pupils: Pupils are equal, round, and reactive to light.  Cardiovascular:     Rate and Rhythm: Normal rate and regular rhythm.     Heart sounds: Normal heart sounds. No murmur. No friction rub. No gallop.   Pulmonary:     Effort: Pulmonary effort is normal. No accessory muscle usage, prolonged expiration or respiratory distress.     Breath sounds: Normal breath sounds. Decreased air movement present. No wheezing, rhonchi or rales.     Comments: Patient is able to speak in long sentences without difficulty Chest:     Chest wall: No tenderness or crepitus.  Abdominal:     General: Bowel sounds are normal. There is no distension.     Palpations: Abdomen is soft.     Tenderness: There is no abdominal tenderness. There is no guarding or rebound.  Musculoskeletal:        General: No  tenderness. Normal range of motion.     Cervical back: Full passive range of motion without pain, normal range of motion and neck supple.     Right lower leg: No edema.     Left lower leg: No edema.     Comments: Moves all extremities well.   Skin:    General: Skin is warm and dry.     Coloration: Skin is not pale.     Findings: No erythema or rash.  Neurological:     General: No focal deficit present.     Mental Status: She is alert and oriented to person, place, and time.     Cranial Nerves: No cranial nerve deficit.  Psychiatric:        Mood and Affect: Mood is anxious.        Speech: Speech normal.        Behavior: Behavior normal.     ED Results / Procedures / Treatments   Labs (all labs ordered are listed, but only abnormal results are displayed) Results for orders placed or performed during the hospital encounter of 12/04/19  TSH  Result Value Ref Range   TSH 3.291 0.350 - 4.500 uIU/mL  CBC  Result Value Ref Range   WBC 11.7 (H) 4.0 - 10.5 K/uL   RBC 4.27 3.87 - 5.11 MIL/uL   Hemoglobin 12.5 12.0 - 15.0 g/dL   HCT 39.1 36.0 - 46.0 %   MCV 91.6 80.0 - 100.0  fL   MCH 29.3 26.0 - 34.0 pg   MCHC 32.0 30.0 - 36.0 g/dL   RDW 14.0 11.5 - 15.5 %   Platelets 196 150 - 400 K/uL   nRBC 0.0 0.0 - 0.2 %  Comprehensive metabolic panel  Result Value Ref Range   Sodium 135 135 - 145 mmol/L   Potassium 3.7 3.5 - 5.1 mmol/L   Chloride 101 98 - 111 mmol/L   CO2 23 22 - 32 mmol/L   Glucose, Bld 145 (H) 70 - 99 mg/dL   BUN 14 6 - 20 mg/dL   Creatinine, Ser 0.87 0.44 - 1.00 mg/dL   Calcium 8.7 (L) 8.9 - 10.3 mg/dL   Total Protein 7.4 6.5 - 8.1 g/dL   Albumin 3.8 3.5 - 5.0 g/dL   AST 17 15 - 41 U/L   ALT 25 0 - 44 U/L   Alkaline Phosphatase 52 38 - 126 U/L   Total Bilirubin 0.3 0.3 - 1.2 mg/dL   GFR calc non Af Amer >60 >60 mL/min   GFR calc Af Amer >60 >60 mL/min   Anion gap 11 5 - 15  Brain natriuretic peptide  Result Value Ref Range   B Natriuretic Peptide 24.0 0.0 -  100.0 pg/mL  D-dimer, quantitative  Result Value Ref Range   D-Dimer, Quant 0.28 0.00 - 0.50 ug/mL-FEU  Troponin I (High Sensitivity)  Result Value Ref Range   Troponin I (High Sensitivity) 2 <18 ng/L  Troponin I (High Sensitivity)  Result Value Ref Range   Troponin I (High Sensitivity) <2 <18 ng/L   Laboratory interpretation all normal except leukocytosis consistent with being on steroids, mild hypocalcemia    EKG EKG Interpretation  Date/Time:  Sunday December 04 2019 22:28:37 EST Ventricular Rate:  93 PR Interval:    QRS Duration: 76 QT Interval:  376 QTC Calculation: 468 R Axis:   50 Text Interpretation: Sinus rhythm Low voltage, precordial leads Confirmed by Fredia Sorrow [phone]) on 12/04/2019 10:34:14 PM   Radiology DG Chest Port 1 View  Result Date: 12/04/2019 CLINICAL DATA:  Shortness of breath EXAM: PORTABLE CHEST 1 VIEW COMPARISON:  Radiograph 10/10/2019 FINDINGS: Low lung volumes with central vascular crowding and some hazy atelectatic changes. No consolidation, features of edema, pneumothorax, or effusion. Prominence of the cardiomediastinal contours likely related to low volumes and portable technique. No gross abnormality is seen. No acute osseous or soft tissue abnormality. IMPRESSION: Low volumes and atelectasis.  No focal consolidative opacity. Electronically Signed   By: Lovena Le M.D.   On: 12/04/2019 23:27    Procedures Procedures (including critical care time)      Medications Ordered in ED Medications  albuterol (VENTOLIN HFA) 108 (90 Base) MCG/ACT inhaler 8 puff (8 puffs Inhalation Given 12/04/19 2355)  aerochamber Z-Stat Plus/medium 1 each (1 each Other Given 12/04/19 2355)    ED Course  I have reviewed the triage vital signs and the nursing notes.  Pertinent labs & imaging results that were available during my care of the patient were reviewed by me and considered in my medical decision making (see chart for details).    MDM  Rules/Calculators/A&P                      Patient appears to be in no respiratory distress.  Her pulse ox on the monitor during my interview was 98 to 100%.  Patient has been trying Primatene Mist over-the-counter that is now not working.  She was given  albuterol inhaler 8 puffs by respiratory therapy.    Recheck at 1:20 AM we discussed her test results.  She states she was only able to take 4 puffs of the albuterol.  Patient is able to speak in full sentences without difficulty.  She is not having increased work of breathing.  Her speech is normal.  At this point I feel like she can be discharged home.  We discussed referral to pulmonary to evaluate her shortness of breath further.  We also discussed the possibility of vocal cord dysfunction.  Delta troponin is negative.  Final Clinical Impression(s) / ED Diagnoses Final diagnoses:  Shortness of breath    Rx / DC Orders ED Discharge Orders    None      Plan discharge  Rolland Porter, MD, Barbette Or, MD 12/05/19 [facility]    Rolland Porter, MD 12/05/19 0200

## 2019-12-06 ENCOUNTER — Encounter: Payer: Self-pay | Admitting: Endocrinology

## 2019-12-06 ENCOUNTER — Ambulatory Visit (INDEPENDENT_AMBULATORY_CARE_PROVIDER_SITE_OTHER): Payer: BC Managed Care – PPO | Admitting: Endocrinology

## 2019-12-06 ENCOUNTER — Other Ambulatory Visit: Payer: Self-pay

## 2019-12-06 VITALS — BP 132/86 | HR 105 | Ht 63.0 in | Wt 264.0 lb

## 2019-12-06 DIAGNOSIS — R739 Hyperglycemia, unspecified: Secondary | ICD-10-CM

## 2019-12-06 DIAGNOSIS — E559 Vitamin D deficiency, unspecified: Secondary | ICD-10-CM

## 2019-12-06 DIAGNOSIS — E890 Postprocedural hypothyroidism: Secondary | ICD-10-CM | POA: Diagnosis not present

## 2019-12-06 NOTE — Progress Notes (Signed)
Subjective:    Patient ID: Heather Fry, female    DOB: 26-Mar-1976, 44 y.o.   MRN: [id number]  HPI Pt returns for f/u of postsurgical hypothyroidism (she had thyroidectomy 1/20, for hyperthyroidism; she was rx'ed synthroid soon thereafter).   She also has mild postsurgical hypoparathyroidism (she no longer takes Ca++, but she takes vit-D, 10,000 units/day).   She also has hypomagnesemia (she takes 250 mg qd).   She reports ongoing sxs (these have been normal: serum catechols, cortisol, and FSH/LH; she has had TAH but not BSO).  She was seen in ER 2 days ago, with sxs of sob and abd pain.   She stopped B-12 oral tab, due to improvement.   Past Medical History:  Diagnosis Date  . Anxiety   . Arthritis   . Depression   . Diverticulosis   . GERD (gastroesophageal reflux disease)   . Graves disease   . Internal hemorrhoids   . Thyroid disease   . Uterine fibroid     Past Surgical History:  Procedure Laterality Date  . ABDOMINAL HYSTERECTOMY     Per patient, uterine fibroids.  . ABDOMINAL HYSTERECTOMY    . CHOLECYSTECTOMY N/A 11/06/2014   Procedure: LAPAROSCOPIC CHOLECYSTECTOMY;  Surgeon: Jamesetta So, MD;  Location: [facility] ORS;  Service: General;  Laterality: N/A;  . COLONOSCOPY N/A 03/04/2017   Dr. Gala Romney: Hemorrhoids, diverticulosis, benign polyp without adenomatous changes.  Next colonoscopy 10 years  . DILATION AND CURETTAGE OF UTERUS    . ESOPHAGOGASTRODUODENOSCOPY N/A 10/02/2014   Dr. Gala Romney: fundic gland polyps, hiatal hernia  . MOUTH SURGERY     extraction of teeth  . POLYPECTOMY  03/04/2017   Procedure: POLYPECTOMY;  Surgeon: Daneil Dolin, MD;  Location: [facility];  Service: Endoscopy;;  colon  . THYROIDECTOMY N/A 09/30/2018   Procedure: TOTAL THYROIDECTOMY;  Surgeon: Armandina Gemma, MD;  Location: [facility];  Service: General;  Laterality: N/A;  . TUBAL LIGATION      Social History   Socioeconomic History  . Marital status: Married    Spouse name: Not on file  . Number  of children: Not on file  . Years of education: Not on file  . Highest education level: Not on file  Occupational History  . Not on file  Tobacco Use  . Smoking status: Former Smoker    Packs/day: 0.50    Years: 3.00    Pack years: 1.50    Types: Cigarettes    Quit date: 11/03/1997    Years since quitting: 22.1  . Smokeless tobacco: Never Used  . Tobacco comment: 1 pack 1 week  Substance and Sexual Activity  . Alcohol use: No    Alcohol/week: 0.0 standard drinks  . Drug use: No  . Sexual activity: Yes    Birth control/protection: Surgical  Other Topics Concern  . Not on file  Social History Narrative  . Not on file   Social Determinants of Health   Financial Resource Strain:   . Difficulty of Paying Living Expenses:   Food Insecurity:   . Worried About Charity fundraiser in the Last Year:   . Arboriculturist in the Last Year:   Transportation Needs:   . Film/video editor (Medical):   Marland Kitchen Lack of Transportation (Non-Medical):   Physical Activity:   . Days of Exercise per Week:   . Minutes of Exercise per Session:   Stress:   . Feeling of Stress :   Social Connections:   .  Frequency of Communication with Friends and Family:   . Frequency of Social Gatherings with Friends and Family:   . Attends Religious Services:   . Active Member of Clubs or Organizations:   . Attends Archivist Meetings:   Marland Kitchen Marital Status:   Intimate Partner Violence:   . Fear of Current or Ex-Partner:   . Emotionally Abused:   Marland Kitchen Physically Abused:   . Sexually Abused:     Current Outpatient Medications on File Prior to Visit  Medication Sig Dispense Refill  . acetaminophen (TYLENOL) 500 MG tablet Take 1,000 mg by mouth daily as needed for moderate pain.    . Cholecalciferol (VITAMIN D3) 125 MCG (5000 UT) CAPS Take 1 capsule (5,000 Units total) by mouth daily. 30 capsule 11  . docusate sodium (COLACE) 100 MG capsule Take 100 mg by mouth at bedtime.    Marland Kitchen escitalopram (LEXAPRO)  20 MG tablet Take 1 tablet (20 mg total) by mouth at bedtime. 30 tablet 5  . Magnesium 250 MG TABS Take 1 tablet by mouth daily.    . NON FORMULARY at bedtime. CPAP at [facility]     . omeprazole (PRILOSEC) 20 MG capsule Take 20 mg by mouth 2 (two) times daily before a meal.    . simethicone (PHAZYME) 125 MG chewable tablet Chew 125-250 mg by mouth every 6 (six) hours as needed for flatulence.     Marland Kitchen SYNTHROID 150 MCG tablet Take 1 tablet (150 mcg total) by mouth daily before breakfast. 90 tablet 3  . Wheat Dextrin (BENEFIBER DRINK MIX PO) Take 1 Dose by mouth 3 (three) times daily.      No current facility-administered medications on file prior to visit.    Allergies  Allergen Reactions  . Levofloxacin Other (See Comments)    Pt can not have due to taking Lexapro   . Toradol [Ketorolac Tromethamine] Anaphylaxis  . Penicillins Other (See Comments)    Loopy, childhood allergy DID THE REACTION INVOLVE: Swelling of the face/tongue/throat, SOB, or low BP? Unknown Sudden or severe rash/hives, skin peeling, or the inside of the mouth or nose? Unknown Did it require medical treatment? Yes When did it last happen?Childhood allergy If all above answers are "NO", may proceed with cephalosporin use.     Family History  Problem Relation Age of Onset  . Hypertension Father   . Diabetes Father   . Depression Other   . Uterine cancer Mother        ?cervical cancer  . Uterine cancer Sister        suicide  . Colon cancer Paternal Aunt 70  . Thyroid disease Neg Hx     BP 132/86 (BP Location: Left Arm, Patient Position: Sitting, Cuff Size: Large)   Pulse (!) 105   Ht 5\' 3"  (1.6 m)   Wt 264 lb (119.7 kg)   LMP 10/09/2014   SpO2 99%   BMI 46.77 kg/m    Review of Systems Denies muscle cramps.      Objective:   Physical Exam VITAL SIGNS:  See vs page GENERAL: no distress Neck: a healed scar is present.  I do not appreciate a nodule in the thyroid or elsewhere in the neck.       Lab Results  Component Value Date   TSH 3.291 12/04/2019   Lab Results  Component Value Date   PTH 16 11/01/2019   CALCIUM 8.7 (L) 12/04/2019   CAION 1.15 10/09/2018   PHOS 3.1 11/01/2019  Assessment & Plan:  Hyperglycemia: check A1c Hypothyroidism: due for recheck Vit-D def: I advised recheck.   Patient Instructions  Blood tests are requested for you today.  Please do at [place] in 2 weeks.  We'll let you know about the results.  Please come back for a follow-up appointment in 6 months.

## 2019-12-06 NOTE — Patient Instructions (Addendum)
Blood tests are requested for you today.  Please do at [place] in 2 weeks.  We'll let you know about the results.  Please come back for a follow-up appointment in 6 months.

## 2019-12-07 ENCOUNTER — Institutional Professional Consult (permissible substitution): Payer: BC Managed Care – PPO | Admitting: Internal Medicine

## 2019-12-13 ENCOUNTER — Ambulatory Visit (INDEPENDENT_AMBULATORY_CARE_PROVIDER_SITE_OTHER): Payer: BC Managed Care – PPO | Admitting: Critical Care Medicine

## 2019-12-13 ENCOUNTER — Other Ambulatory Visit: Payer: Self-pay

## 2019-12-13 ENCOUNTER — Encounter: Payer: Self-pay | Admitting: Critical Care Medicine

## 2019-12-13 VITALS — BP 124/78 | HR 85 | Temp 97.8°F | Ht 64.0 in | Wt 269.0 lb

## 2019-12-13 DIAGNOSIS — J4550 Severe persistent asthma, uncomplicated: Secondary | ICD-10-CM | POA: Diagnosis not present

## 2019-12-13 MED ORDER — FLUTICASONE PROPIONATE 50 MCG/ACT NA SUSP: 2.0000 | Freq: Every day | NASAL | 2 refills | Status: DC

## 2019-12-13 MED ORDER — MONTELUKAST SODIUM 10 MG PO TABS: 10.0000 mg | ORAL_TABLET | Freq: Every day | ORAL | 11 refills | Status: DC

## 2019-12-13 MED ORDER — ALBUTEROL SULFATE HFA 108 (90 BASE) MCG/ACT IN AERS: 2.0000 | INHALATION_SPRAY | Freq: Four times a day (QID) | RESPIRATORY_TRACT | 11 refills | Status: DC | PRN

## 2019-12-13 MED ORDER — BUDESONIDE-FORMOTEROL FUMARATE 160-4.5 MCG/ACT IN AERO: 2.0000 | INHALATION_SPRAY | Freq: Two times a day (BID) | RESPIRATORY_TRACT | 11 refills | Status: DC

## 2019-12-13 MED ORDER — CETIRIZINE HCL 10 MG PO TABS: 10.0000 mg | ORAL_TABLET | Freq: Every day | ORAL | 11 refills | Status: DC

## 2019-12-13 NOTE — Patient Instructions (Addendum)
Thank you for visiting Dr. Carlis Abbott at [facility]. We recommend the following: Orders Placed This Encounter  Procedures  . Pulmonary function test   Orders Placed This Encounter  Procedures  . Pulmonary function test    Standing Status:   Future    Standing Expiration Date:   12/12/2020    Order Specific Question:   Where should this test be performed?    Answer:   [facility]    Order Specific Question:   Full PFT: includes the following: basic spirometry, spirometry pre & post bronchodilator, diffusion capacity (DLCO), lung volumes    Answer:   Full PFT    Meds ordered this encounter  Medications  . budesonide-formoterol (SYMBICORT) 160-4.5 MCG/ACT inhaler    Sig: Inhale 2 puffs into the lungs in the morning and at bedtime.    Dispense:  1 Inhaler    Refill:  11  . albuterol (VENTOLIN HFA) 108 (90 Base) MCG/ACT inhaler    Sig: Inhale 2 puffs into the lungs every 6 (six) hours as needed.    Dispense:  18 g    Refill:  11  . cetirizine (ZYRTEC ALLERGY) 10 MG tablet    Sig: Take 1 tablet (10 mg total) by mouth daily.    Dispense:  30 tablet    Refill:  11  . fluticasone (FLONASE) 50 MCG/ACT nasal spray    Sig: Place 2 sprays into both nostrils daily.    Dispense:  16 g    Refill:  2  . montelukast (SINGULAIR) 10 MG tablet    Sig: Take 1 tablet (10 mg total) by mouth at bedtime.    Dispense:  30 tablet    Refill:  11    Return in about 6 weeks (around 01/24/2020).    Please do your part to reduce the spread of COVID-19.

## 2019-12-13 NOTE — Progress Notes (Signed)
Synopsis: Referred in March 2021 for shortness of breath by Redmond School, MD  Subjective:   PATIENT ID: Heather Fry GENDER: female DOB: 04/30/1976, MRN: [id number]  Chief Complaint  Patient presents with  . Consult    Patient is here to establish care for asthma. Patient says that she was diagnosed with asthma about 2 weeks ago. Patient went to the ED on 3/7 and was given a rescue inhaler and has some relief from it. Patient says that when she exerts herself, when she is around her husband vaping, or gets to hot it makes worse. Patient has graves disease.    Mrs. Heather Fry is a 44 y/o woman with a history of Grave's disease s/p resection who was recently diagnosed with asthma in the ED 2 at [facility] on 12/04/19 who presents for evaluation.  She has a history of chronic allergies which have worsened over time.  She began having breathing symptoms several weeks ago which progressively worsened.  She has nasal congestion, rhinorrhea, hoarseness, wheezing, severe chest tightness all the way around her chest, dyspnea on exertion, cough, thick sputum production.  Her symptoms are worse when she is hot.  Her husband vaping vapors worsen her symptoms.  She has well-controlled GERD on omeprazole.  She was previously taking Claritin.  When she presented to the ED a few weeks ago she was having laryngeal spasms at the same time.  She currently is taking albuterol multiple times per day with relief in her symptoms.  Prior to her ED visit she was taking Primatene.  When her breathing symptoms were originally being evaluated her endocrinologist checked a sed rate, which was increased.  At that time she was referred to [facility] rheumatology and was seen by Garen Grams.  At that time she was diagnosed with mixed connective tissue disease and started on Medrol dose pack.  She was subsequently started on Plaquenil, which had to be stopped due to an adverse reaction.  The diagnosis of mixed connective tissue disease is now  in question and she has been tapered off steroids.  Tapering off steroids is what precipitated the asthma attack as her breathing continues to worsen as the steroids are decreased.  She has an appointment at [facility] with rheumatology in April for evaluation.  Is previously undergone cardiac work-up, which was negative.  She has severe OSA which is treated with autoPAP.  After her thyroidectomy she has had significant weight gain issues, and she would like to lose the excess weight she has gained.  She has a history of tobacco abuse in the past.  She urgently quit smoking 1999, but started again about 2 years ago her total 3 months prior to quitting permanently.  She has a family history of asthma in her paternal uncle.  ACT 11     Past Medical History:  Diagnosis Date  . Anxiety   . Arthritis   . Depression   . Diverticulosis   . GERD (gastroesophageal reflux disease)   . Graves disease   . Internal hemorrhoids   . Thyroid disease   . Uterine fibroid      Family History  Problem Relation Age of Onset  . Hypertension Father   . Diabetes Father   . Depression Other   . Uterine cancer Mother        ?cervical cancer  . Uterine cancer Sister        suicide  . Colon cancer Paternal Aunt 103  . Thyroid disease Neg Hx  Past Surgical History:  Procedure Laterality Date  . ABDOMINAL HYSTERECTOMY     Per patient, uterine fibroids.  . ABDOMINAL HYSTERECTOMY    . CHOLECYSTECTOMY N/A 11/06/2014   Procedure: LAPAROSCOPIC CHOLECYSTECTOMY;  Surgeon: Jamesetta So, MD;  Location: [facility] ORS;  Service: General;  Laterality: N/A;  . COLONOSCOPY N/A 03/04/2017   Dr. Gala Romney: Hemorrhoids, diverticulosis, benign polyp without adenomatous changes.  Next colonoscopy 10 years  . DILATION AND CURETTAGE OF UTERUS    . ESOPHAGOGASTRODUODENOSCOPY N/A 10/02/2014   Dr. Gala Romney: fundic gland polyps, hiatal hernia  . MOUTH SURGERY     extraction of teeth  . POLYPECTOMY  03/04/2017   Procedure: POLYPECTOMY;   Surgeon: Daneil Dolin, MD;  Location: [facility] END[facility] SUITE;  Service: Endoscopy;;  colon  . THYROIDECTOMY N/A 09/30/2018   Procedure: TOTAL THYROIDECTOMY;  Surgeon: Armandina Gemma, MD;  Location: [facility] ORS;  Service: General;  Laterality: N/A;  . TUBAL LIGATION      Social History   Socioeconomic History  . Marital status: Married    Spouse name: Not on file  . Number of children: Not on file  . Years of education: Not on file  . Highest education level: Not on file  Occupational History  . Not on file  Tobacco Use  . Smoking status: Former Smoker    Packs/day: 1.00    Years: 5.00    Pack years: 5.00    Types: Cigarettes    Quit date: 11/03/1997    Years since quitting: 22.1  . Smokeless tobacco: Never Used  . Tobacco comment: 1 pack 1 week  Substance and Sexual Activity  . Alcohol use: No    Alcohol/week: 0.0 standard drinks  . Drug use: No  . Sexual activity: Yes    Birth control/protection: Surgical  Other Topics Concern  . Not on file  Social History Narrative  . Not on file   Social Determinants of Health   Financial Resource Strain:   . Difficulty of Paying Living Expenses:   Food Insecurity:   . Worried About Charity fundraiser in the Last Year:   . Arboriculturist in the Last Year:   Transportation Needs:   . Film/video editor (Medical):   Marland Kitchen Lack of Transportation (Non-Medical):   Physical Activity:   . Days of Exercise per Week:   . Minutes of Exercise per Session:   Stress:   . Feeling of Stress :   Social Connections:   . Frequency of Communication with Friends and Family:   . Frequency of Social Gatherings with Friends and Family:   . Attends Religious Services:   . Active Member of Clubs or Organizations:   . Attends Archivist Meetings:   Marland Kitchen Marital Status:   Intimate Partner Violence:   . Fear of Current or Ex-Partner:   . Emotionally Abused:   Marland Kitchen Physically Abused:   . Sexually Abused:      Allergies  Allergen Reactions  .  Levofloxacin Other (See Comments)    Pt can not have due to taking Lexapro   . Toradol [Ketorolac Tromethamine] Anaphylaxis  . Penicillins Other (See Comments)    Loopy, childhood allergy DID THE REACTION INVOLVE: Swelling of the face/tongue/throat, SOB, or low BP? Unknown Sudden or severe rash/hives, skin peeling, or the inside of the mouth or nose? Unknown Did it require medical treatment? Yes When did it last happen?Childhood allergy If all above answers are "NO", may proceed with cephalosporin use.  Immunization History  Administered Date(s) Administered  . Influenza-Unspecified 07/08/2019    Outpatient Medications Prior to Visit  Medication Sig Dispense Refill  . acetaminophen (TYLENOL) 500 MG tablet Take 1,000 mg by mouth daily as needed for moderate pain.    Marland Kitchen albuterol (VENTOLIN HFA) 108 (90 Base) MCG/ACT inhaler Inhale 1 puff into the lungs as needed.    . Cholecalciferol (VITAMIN D3) 125 MCG (5000 UT) CAPS Take 1 capsule (5,000 Units total) by mouth daily. 30 capsule 11  . docusate sodium (COLACE) 100 MG capsule Take 100 mg by mouth at bedtime.    Marland Kitchen escitalopram (LEXAPRO) 20 MG tablet Take 1 tablet (20 mg total) by mouth at bedtime. 30 tablet 5  . Magnesium 250 MG TABS Take 1 tablet by mouth daily.    . methylPREDNISolone (MEDROL) 4 MG tablet Take 4 mg by mouth in the morning and at bedtime.    . NON FORMULARY at bedtime. CPAP at [facility]     . omeprazole (PRILOSEC) 20 MG capsule Take 20 mg by mouth 2 (two) times daily before a meal.    . simethicone (PHAZYME) 125 MG chewable tablet Chew 125-250 mg by mouth every 6 (six) hours as needed for flatulence.     Marland Kitchen SYNTHROID 150 MCG tablet Take 1 tablet (150 mcg total) by mouth daily before breakfast. 90 tablet 3  . Wheat Dextrin (BENEFIBER DRINK MIX PO) Take 1 Dose by mouth 3 (three) times daily.      No facility-administered medications prior to visit.    Review of Systems  Constitutional: Negative.   HENT: Positive  for congestion.   Respiratory: Positive for cough, sputum production, shortness of breath and wheezing.        Chest tightness  Cardiovascular: Negative for chest pain and leg swelling.  Gastrointestinal: Negative for heartburn.  Endo/Heme/Allergies: Positive for environmental allergies.     Objective:   Vitals:   12/13/19 1507  BP: 124/78  Pulse: 85  Temp: 97.8 F (36.6 C)  TempSrc: Temporal  SpO2: 98%  Weight: 269 lb (122 kg)  Height: 5\' 4"  (1.626 m)   98% on   RA BMI Readings from Last 3 Encounters:  12/13/19 46.17 kg/m  12/06/19 46.77 kg/m  12/04/19 46.06 kg/m   Wt Readings from Last 3 Encounters:  12/13/19 269 lb (122 kg)  12/06/19 264 lb (119.7 kg)  12/04/19 260 lb (117.9 kg)    Physical Exam Vitals reviewed.  Constitutional:      Appearance: She is obese. She is not ill-appearing.  HENT:     Head: Normocephalic and atraumatic.     Nose:     Comments: Deferred due to masking requirement.    Mouth/Throat:     Comments: Deferred due to masking requirement. Eyes:     General: No scleral icterus. Cardiovascular:     Rate and Rhythm: Normal rate and regular rhythm.     Heart sounds: No murmur.  Pulmonary:     Comments: CTAB, breathing comfortably on RA. Has albuterol inhaler on lanyard readily available. Abdominal:     General: There is no distension.     Palpations: Abdomen is soft.  Musculoskeletal:        General: No swelling or deformity.     Cervical back: Neck supple.  Lymphadenopathy:     Cervical: No cervical adenopathy.  Skin:    General: Skin is warm and dry.     Findings: No rash.  Neurological:     General: No focal deficit  present.     Mental Status: She is alert.     Coordination: Coordination normal.  Psychiatric:        Mood and Affect: Mood normal.        Behavior: Behavior normal.      CBC    Component Value Date/Time   WBC 11.7 (H) 12/04/2019 2308   RBC 4.27 12/04/2019 2308   HGB 12.5 12/04/2019 2308   HCT 39.1  12/04/2019 2308   PLT 196 12/04/2019 2308   MCV 91.6 12/04/2019 2308   MCH 29.3 12/04/2019 2308   MCHC 32.0 12/04/2019 2308   RDW 14.0 12/04/2019 2308   LYMPHSABS 2.1 10/10/2019 2030   MONOABS 0.4 10/10/2019 2030   EOSABS 0.0 10/10/2019 2030   BASOSABS 0.0 10/10/2019 2030    CHEMISTRY No results for input(s): NA, K, CL, CO2, GLUCOSE, BUN, CREATININE, CALCIUM, MG, PHOS in the last 168 hours. Estimated Creatinine Clearance: 107.4 mL/min (by C-G formula based on SCr of 0.87 mg/dL).  0.28 on 12/04/2019 BNP 24 on 12/04/2019  Chest Imaging- films reviewed: CXR, 1 view 12/04/2019-low lung volumes likely contributing to the appearance of widened mediastinum and cardiomegaly  CT abdomen pelvis 04/07/2019-lung images reviewed-possible reticular changes of dependent bilateral lower lobes.  Pulmonary Functions Testing Results: No flowsheet data found.    Echocardiogram 20/2021: LVEF 60 to 65%, no LVH.  Function.  Mildly dilated LA, normal pressure.  Normal RV, RA.  Trivial MR.      Assessment & Plan:     ICD-10-CM   1. Severe persistent asthma without complication  123XX123 Pulmonary function test  2. Morbid obesity due to excess calories (HCC)  E66.01     Severe persistent asthma; suspect allergic asthma. -CBC with differential and IgE off of steroids -Starting Symbicort twice daily.  Instructed on proper inhaler technique and instructed to rinse her mouth after use. -Singulair once daily -Allergic rhinosinusitis management -Continue albuterol as needed. -If she has recurrent flare of symptoms with tapering of prednisone, add LAMA and consider Biologics. -PFTs -Encouraged to avoid all triggers -Agree with her that weight loss would benefit her   Allergic rhinosinusitis -Start Zyrtec once daily -Singulair once daily -Start Flonase once daily.  Okay to continue saline rinses.  Use saline first.  Obesity -Recommend modest weight loss   RTC in 2 months with PFTs or sooner as  needed.   Current Outpatient Medications:  .  acetaminophen (TYLENOL) 500 MG tablet, Take 1,000 mg by mouth daily as needed for moderate pain., Disp: , Rfl:  .  albuterol (VENTOLIN HFA) 108 (90 Base) MCG/ACT inhaler, Inhale 1 puff into the lungs as needed., Disp: , Rfl:  .  Cholecalciferol (VITAMIN D3) 125 MCG (5000 UT) CAPS, Take 1 capsule (5,000 Units total) by mouth daily., Disp: 30 capsule, Rfl: 11 .  docusate sodium (COLACE) 100 MG capsule, Take 100 mg by mouth at bedtime., Disp: , Rfl:  .  escitalopram (LEXAPRO) 20 MG tablet, Take 1 tablet (20 mg total) by mouth at bedtime., Disp: 30 tablet, Rfl: 5 .  Magnesium 250 MG TABS, Take 1 tablet by mouth daily., Disp: , Rfl:  .  methylPREDNISolone (MEDROL) 4 MG tablet, Take 4 mg by mouth in the morning and at bedtime., Disp: , Rfl:  .  NON FORMULARY, at bedtime. CPAP at [facility] , Disp: , Rfl:  .  omeprazole (PRILOSEC) 20 MG capsule, Take 20 mg by mouth 2 (two) times daily before a meal., Disp: , Rfl:  .  simethicone (PHAZYME)  125 MG chewable tablet, Chew 125-250 mg by mouth every 6 (six) hours as needed for flatulence. , Disp: , Rfl:  .  SYNTHROID 150 MCG tablet, Take 1 tablet (150 mcg total) by mouth daily before breakfast., Disp: 90 tablet, Rfl: 3 .  Wheat Dextrin (BENEFIBER DRINK MIX PO), Take 1 Dose by mouth 3 (three) times daily. , Disp: , Rfl:  .  albuterol (VENTOLIN HFA) 108 (90 Base) MCG/ACT inhaler, Inhale 2 puffs into the lungs every 6 (six) hours as needed., Disp: 18 g, Rfl: 11 .  budesonide-formoterol (SYMBICORT) 160-4.5 MCG/ACT inhaler, Inhale 2 puffs into the lungs in the morning and at bedtime., Disp: 1 Inhaler, Rfl: 11 .  cetirizine (ZYRTEC ALLERGY) 10 MG tablet, Take 1 tablet (10 mg total) by mouth daily., Disp: 30 tablet, Rfl: 11 .  fluticasone (FLONASE) 50 MCG/ACT nasal spray, Place 2 sprays into both nostrils daily., Disp: 16 g, Rfl: 2 .  montelukast (SINGULAIR) 10 MG tablet, Take 1 tablet (10 mg total) by mouth at bedtime., Disp:  30 tablet, Rfl: [place] Corbitt Cloke, DO [facility] 12/13/2019 3:35 PM

## 2019-12-20 DIAGNOSIS — E890 Postprocedural hypothyroidism: Secondary | ICD-10-CM | POA: Diagnosis not present

## 2019-12-20 DIAGNOSIS — E559 Vitamin D deficiency, unspecified: Secondary | ICD-10-CM | POA: Diagnosis not present

## 2019-12-20 DIAGNOSIS — R739 Hyperglycemia, unspecified: Secondary | ICD-10-CM | POA: Diagnosis not present

## 2019-12-21 LAB — PTH, INTACT AND CALCIUM
Calcium: 8.9 mg/dL (ref 8.7–10.2)
PTH: 25 pg/mL (ref 15–65)

## 2019-12-21 LAB — HEMOGLOBIN A1C
Est. average glucose Bld gHb Est-mCnc: 128 mg/dL
Hgb A1c MFr Bld: 6.1 % — ABNORMAL HIGH (ref 4.8–5.6)

## 2019-12-21 LAB — TSH: TSH: 2.12 u[IU]/mL (ref 0.450–4.500)

## 2019-12-21 LAB — VITAMIN D 25 HYDROXY (VIT D DEFICIENCY, FRACTURES): Vit D, 25-Hydroxy: 66.7 ng/mL (ref 30.0–100.0)

## 2019-12-28 ENCOUNTER — Telehealth: Payer: Self-pay | Admitting: Critical Care Medicine

## 2019-12-28 ENCOUNTER — Encounter: Payer: Self-pay | Admitting: Cardiology

## 2019-12-28 ENCOUNTER — Other Ambulatory Visit: Payer: Self-pay

## 2019-12-28 ENCOUNTER — Ambulatory Visit (INDEPENDENT_AMBULATORY_CARE_PROVIDER_SITE_OTHER): Payer: BC Managed Care – PPO | Admitting: Cardiology

## 2019-12-28 VITALS — BP 138/86 | HR 87 | Ht 64.0 in | Wt 271.4 lb

## 2019-12-28 DIAGNOSIS — R079 Chest pain, unspecified: Secondary | ICD-10-CM

## 2019-12-28 DIAGNOSIS — R0609 Other forms of dyspnea: Secondary | ICD-10-CM

## 2019-12-28 DIAGNOSIS — R0600 Dyspnea, unspecified: Secondary | ICD-10-CM | POA: Diagnosis not present

## 2019-12-28 DIAGNOSIS — I10 Essential (primary) hypertension: Secondary | ICD-10-CM | POA: Diagnosis not present

## 2019-12-28 DIAGNOSIS — E785 Hyperlipidemia, unspecified: Secondary | ICD-10-CM

## 2019-12-28 NOTE — Progress Notes (Signed)
Cardiology Office Note:    Date:  12/28/2019   ID:  Heather Fry, DOB 1975-10-11, MRN [id number]  PCP:  Redmond School, MD  Cardiologist:  No primary care provider on file.  Electrophysiologist:  None   Referring MD: Redmond School, MD   Chief Complaint  Patient presents with  . Chest Pain    History of Present Illness:    Heather Fry is a 44 y.o. female with a hx of Graves' disease status post thyroidectomy, hypertension, hyperlipidemia, OSA on CPAP who presents for follow-up. She was initially seen on the 08/18/19 forfatigue/dyspnea/chest pain. She reports that she had beenstarted on pravastatin 3 weeks prior. States that since that time she hadbeen having muscle aches, fatigue, weakness, chest pain and shortness of breath. She went to the ED in [place], work-up was unremarkable. States she has felt like she did when her thyroid levels were abnormal, but had thyroid levels checked and was unremarkable. States that she used to walk 3 miles per day, but now is short of breath with minimal exertion. Also reports chest pain on the right side of chest, under the breast. Pain occurs at rest, has not noted any relationship with exertion. This also has started since she began pravastatin. She has decreased her pravastatin dose from daily to once per week.  Quit smoking in September 2019. No known family history heart disease. Not on any blood pressure meds since started CPAP for her OSA.   Given her 10-year ASCVD risk score 1.7% in symptoms with starting statin, recommended that statin be discontinued. Plan was to reevaluate in 1 month, and if symptoms continue rosuvastatin, then plan further evaluation with echo and stress test. She reported that she felt improved with stopping statin initially but then had recurrence of symptoms.  She presented to the ED on 09/28/2019 with chest pain. Reported pressure in lower chest that lasted about 30 minutes and resolved.  High-sensitivity troponins negative x2.    Given her symptoms, Lexiscan Myoview was done on 10/13/2019, which showed normal perfusion, LVEF 63%.  TTE on 10/19/2019 showed normal LVEF, normal RV function, no significant valvular disease.  Since last clinic visit, she reports her chest pain is significantly improved.  Thinks that it is related to asthma, has improved with treatment of asthma by Dr. Carlis Abbott.  Also reports significant improvement in dyspnea.   Past Medical History:  Diagnosis Date  . Anxiety   . Arthritis   . Depression   . Diverticulosis   . GERD (gastroesophageal reflux disease)   . Graves disease   . Internal hemorrhoids   . Thyroid disease   . Uterine fibroid     Past Surgical History:  Procedure Laterality Date  . ABDOMINAL HYSTERECTOMY     Per patient, uterine fibroids.  . ABDOMINAL HYSTERECTOMY    . CHOLECYSTECTOMY N/A 11/06/2014   Procedure: LAPAROSCOPIC CHOLECYSTECTOMY;  Surgeon: Jamesetta So, MD;  Location: [facility];  Service: General;  Laterality: N/A;  . COLONOSCOPY N/A 03/04/2017   Dr. Gala Romney: Hemorrhoids, diverticulosis, benign polyp without adenomatous changes.  Next colonoscopy 10 years  . DILATION AND CURETTAGE OF UTERUS    . ESOPHAGOGASTRODUODENOSCOPY N/A 10/02/2014   Dr. Gala Romney: fundic gland polyps, hiatal hernia  . MOUTH SURGERY     extraction of teeth  . POLYPECTOMY  03/04/2017   Procedure: POLYPECTOMY;  Surgeon: Daneil Dolin, MD;  Location: [facility];  Service: Endoscopy;;  colon  . THYROIDECTOMY N/A 09/30/2018   Procedure: TOTAL THYROIDECTOMY;  Surgeon:  Armandina Gemma, MD;  Location: [facility];  Service: General;  Laterality: N/A;  . TUBAL LIGATION      Current Medications: Current Meds  Medication Sig  . acetaminophen (TYLENOL) 500 MG tablet Take 1,000 mg by mouth daily as needed for moderate pain.  Marland Kitchen albuterol (VENTOLIN HFA) 108 (90 Base) MCG/ACT inhaler Inhale 1 puff into the lungs as needed.  Marland Kitchen albuterol (VENTOLIN HFA) 108 (90 Base) MCG/ACT  inhaler Inhale 2 puffs into the lungs every 6 (six) hours as needed.  . budesonide-formoterol (SYMBICORT) 160-4.5 MCG/ACT inhaler Inhale 2 puffs into the lungs in the morning and at bedtime.  . cetirizine (ZYRTEC ALLERGY) 10 MG tablet Take 1 tablet (10 mg total) by mouth daily.  . Cholecalciferol (VITAMIN D3) 125 MCG (5000 UT) CAPS Take 1 capsule (5,000 Units total) by mouth daily.  Marland Kitchen docusate sodium (COLACE) 100 MG capsule Take 100 mg by mouth at bedtime.  . DULoxetine (CYMBALTA) 60 MG capsule Take 60 mg by mouth daily.  Marland Kitchen escitalopram (LEXAPRO) 20 MG tablet Take 1 tablet (20 mg total) by mouth at bedtime.  . fluticasone (FLONASE) 50 MCG/ACT nasal spray Place 2 sprays into both nostrils daily.  . Magnesium 250 MG TABS Take 1 tablet by mouth daily.  . methylPREDNISolone (MEDROL) 4 MG tablet Take 4 mg by mouth in the morning and at bedtime.  . montelukast (SINGULAIR) 10 MG tablet Take 1 tablet (10 mg total) by mouth at bedtime.  . NON FORMULARY at bedtime. CPAP at night   . omeprazole (PRILOSEC) 20 MG capsule Take 20 mg by mouth 2 (two) times daily before a meal.  . simethicone (PHAZYME) 125 MG chewable tablet Chew 125-250 mg by mouth every 6 (six) hours as needed for flatulence.   Marland Kitchen SYNTHROID 150 MCG tablet Take 1 tablet (150 mcg total) by mouth daily before breakfast.  . Wheat Dextrin (BENEFIBER DRINK MIX PO) Take 1 Dose by mouth 3 (three) times daily.      Allergies:   Levofloxacin, Toradol [ketorolac tromethamine], and Penicillins   Social History   Socioeconomic History  . Marital status: Married    Spouse name: Not on file  . Number of children: Not on file  . Years of education: Not on file  . Highest education level: Not on file  Occupational History  . Not on file  Tobacco Use  . Smoking status: Former Smoker    Packs/day: 1.00    Years: 5.00    Pack years: 5.00    Types: Cigarettes    Quit date: 11/03/1997    Years since quitting: 22.1  . Smokeless tobacco: Never Used  .  Tobacco comment: 1 pack 1 week  Substance and Sexual Activity  . Alcohol use: No    Alcohol/week: 0.0 standard drinks  . Drug use: No  . Sexual activity: Yes    Birth control/protection: Surgical  Other Topics Concern  . Not on file  Social History Narrative  . Not on file   Social Determinants of Health   Financial Resource Strain:   . Difficulty of Paying Living Expenses:   Food Insecurity:   . Worried About Charity fundraiser in the Last Year:   . Arboriculturist in the Last Year:   Transportation Needs:   . Film/video editor (Medical):   Marland Kitchen Lack of Transportation (Non-Medical):   Physical Activity:   . Days of Exercise per Week:   . Minutes of Exercise per Session:   Stress:   .  Feeling of Stress :   Social Connections:   . Frequency of Communication with Friends and Family:   . Frequency of Social Gatherings with Friends and Family:   . Attends Religious Services:   . Active Member of Clubs or Organizations:   . Attends Archivist Meetings:   Marland Kitchen Marital Status:      Family History: The patient's family history includes Colon cancer (age of onset: 33) in her paternal aunt; Depression in an other family member; Diabetes in her father; Hypertension in her father; Uterine cancer in her mother and sister. There is no history of Thyroid disease.  ROS:   Please see the history of present illness.     All other systems reviewed and are negative.  EKGs/Labs/Other Studies Reviewed:    The following studies were reviewed today:   EKG:  EKG is ordered today.  The ekg ordered today demonstrates normal sinus rhythm, rate 88, no ST/T abnormalities   Lexiscan Myoview 10/13/2019:  The left ventricular ejection fraction is normal (55-65%).  Nuclear stress EF: 63%.  There was no ST segment deviation noted during stress.  No T wave inversion was noted during stress.  The study is normal.  This is a low risk study.   1. Normal myocardial perfusion imaging  study without evidence of ischemia or infarction.  2. Normal LVEF, 63%. Normal wall motion.  3. Low-risk study.   TTE 10/19/19: 1. Left ventricular ejection fraction, by visual estimation, is 60 to  65%. The left ventricle has normal function. There is no left ventricular  hypertrophy.  2. The left ventricle has no regional wall motion abnormalities.  3. Global right ventricle has normal systolic function.The right  ventricular size is normal. No increase in right ventricular wall  thickness.  4. Left atrial size was mildly dilated.  5. Right atrial size was normal.  6. The mitral valve is normal in structure. Trivial mitral valve  regurgitation. No evidence of mitral stenosis.  7. The tricuspid valve is normal in structure.  8. The tricuspid valve is normal in structure. Tricuspid valve  regurgitation is not demonstrated.  9. The aortic valve was not well visualized. Aortic valve regurgitation  is not visualized. No evidence of aortic valve sclerosis or stenosis.  10. The pulmonic valve was normal in structure. Pulmonic valve  regurgitation is not visualized.  11. The inferior vena cava is normal in size with greater than 50%  respiratory variability, suggesting right atrial pressure of 3 mmHg.  12. The interatrial septum was not well visualized.   Recent Labs: 11/01/2019: Magnesium 1.9 12/04/2019: ALT 25; B Natriuretic Peptide 24.0; BUN 14; Creatinine, Ser 0.87; Hemoglobin 12.5; Platelets 196; Potassium 3.7; Sodium 135 12/20/2019: TSH 2.120  Recent Lipid Panel    Component Value Date/Time   CHOL 219 (H) 06/29/2019 1458   TRIG 344.0 (H) 06/29/2019 1458   HDL 41.10 06/29/2019 1458   CHOLHDL 5 06/29/2019 1458   VLDL 68.8 (H) 06/29/2019 1458   LDLDIRECT 135.0 06/29/2019 1458    Physical Exam:    VS:  BP 138/86   Pulse 87   Ht 5\' 4"  (1.626 m)   Wt 271 lb 6.4 oz (123.1 kg)   LMP 10/09/2014   SpO2 100%   BMI 46.59 kg/m     Wt Readings from Last 3 Encounters:   12/28/19 271 lb 6.4 oz (123.1 kg)  12/13/19 269 lb (122 kg)  12/06/19 264 lb (119.7 kg)     GEN:  in no  acute distress HEENT: Normal NECK: No JVD LYMPHATICS: No lymphadenopathy CARDIAC: RRR, no murmurs, rubs, gallops RESPIRATORY:  Clear to auscultation without rales, wheezing or rhonchi  ABDOMEN: Soft, non-tender, non-distended MUSCULOSKELETAL:  trace edema; No deformity  SKIN: Warm and dry NEUROLOGIC:  Alert and oriented x 3 PSYCHIATRIC:  Normal affect   ASSESSMENT:    1. Chest pain of uncertain etiology   2. DOE (dyspnea on exertion)   3. Essential hypertension   4. Hyperlipidemia, unspecified hyperlipidemia type    PLAN:    Dyspnea/chest pain/fatigue/myalgias: initially thought to be secondary to statin use, but continued to have symptoms with discontinuation of statin.  Suspect hypothyroidism contributing, has improved since thyroid levels have normalized.  Asthma also likely contributing, symptoms significantly improved with treatment of asthma. No evidence of cardiac etiology.  TTE shows no structural heart disease.  Myoview shows no perfusion defect  Hypertension: Reports has not required blood pressure medicine since she started CPAP for her OSA.  Borderline elevated in clinic today, but has been taking steroids for asthma  Hyperlipidemia: LDL 135 on 06/29/2019. Her 10-year ASCVD risk score is 1.7%, so she would be at low risk for an ASCVD event.Stopped statin on 08/18/2019.  OSA: On CPAP  RTC in 1 year  Medication Adjustments/Labs and Tests Ordered: Current medicines are reviewed at length with the patient today.  Concerns regarding medicines are outlined above.  No orders of the defined types were placed in this encounter.  No orders of the defined types were placed in this encounter.   Patient Instructions  Medication Instructions:  CONTINUE WITH CURRENT MEDICATIONS. NO CHANGES.  *If you need a refill on your cardiac medications before your next  appointment, please call your pharmacy  Follow-Up: At [facility], you and your health needs are our priority.  As part of our continuing mission to provide you with exceptional heart care, we have created designated Provider Care Teams.  These Care Teams include your primary Cardiologist (physician) and Advanced Practice Providers (APPs -  Physician Assistants and Nurse Practitioners) who all work together to provide you with the care you need, when you need it.  We recommend signing up for the patient portal called "MyChart".  Sign up information is provided on this After Visit Summary.  MyChart is used to connect with patients for Virtual Visits (Telemedicine).  Patients are able to view lab/test results, encounter notes, upcoming appointments, etc.  Non-urgent messages can be sent to your provider as well.   To learn more about what you can do with MyChart, go to [email or web address].    Your next appointment:   12 month(s)  The format for your next appointment:   In Person  Provider:   Oswaldo Milian, MD        Signed, Donato Heinz, MD  12/28/2019 9:59 AM    [place]

## 2019-12-28 NOTE — Telephone Encounter (Signed)
[facility] in [place] called to change Budesonide-formoterol fumarate 160-4.53mcg/ACT Aerosol to Symbicort 160-4.1mcg/Actuation HFAA. Generic not covered, no PA required for Symbicort. [facility] agreed to change to Symbicort.

## 2019-12-28 NOTE — Patient Instructions (Signed)
Medication Instructions:  CONTINUE WITH CURRENT MEDICATIONS. NO CHANGES.  *If you need a refill on your cardiac medications before your next appointment, please call your pharmacy  Follow-Up: At [facility], you and your health needs are our priority.  As part of our continuing mission to provide you with exceptional heart care, we have created designated Provider Care Teams.  These Care Teams include your primary Cardiologist (physician) and Advanced Practice Providers (APPs -  Physician Assistants and Nurse Practitioners) who all work together to provide you with the care you need, when you need it.  We recommend signing up for the patient portal called "MyChart".  Sign up information is provided on this After Visit Summary.  MyChart is used to connect with patients for Virtual Visits (Telemedicine).  Patients are able to view lab/test results, encounter notes, upcoming appointments, etc.  Non-urgent messages can be sent to your provider as well.   To learn more about what you can do with MyChart, go to [email or web address].    Your next appointment:   12 month(s)  The format for your next appointment:   In Person  Provider:   Oswaldo Milian, MD

## 2019-12-30 DIAGNOSIS — J45909 Unspecified asthma, uncomplicated: Secondary | ICD-10-CM | POA: Diagnosis not present

## 2019-12-30 DIAGNOSIS — R768 Other specified abnormal immunological findings in serum: Secondary | ICD-10-CM | POA: Diagnosis not present

## 2019-12-30 DIAGNOSIS — M2550 Pain in unspecified joint: Secondary | ICD-10-CM | POA: Diagnosis not present

## 2019-12-30 DIAGNOSIS — R531 Weakness: Secondary | ICD-10-CM | POA: Diagnosis not present

## 2019-12-30 DIAGNOSIS — Z7952 Long term (current) use of systemic steroids: Secondary | ICD-10-CM | POA: Diagnosis not present

## 2020-01-02 ENCOUNTER — Telehealth: Payer: Self-pay | Admitting: Critical Care Medicine

## 2020-01-02 NOTE — Telephone Encounter (Signed)
Did she start on zyrtec and flonase from last ov .  Should be taking symbicort and singulair .  Was given medrol dose pack 2 weeks ago, is she no better from this ?  Dr. Carlis Abbott has opening this week can put patient in .  May need another prednisone taper if not doing better.   Please contact office for sooner follow up if symptoms do not improve or worsen or seek emergency care

## 2020-01-02 NOTE — Telephone Encounter (Signed)
Spoke with pt. States that she feels like she is having a flare up of her allergies. Reports chest tightness, wheezing, coughing and shortness of breath. Cough is non productive as the pt's mucus is so thick she can't cough it up. Denies fever/chills, sore throat, severe headache, joint pain, unexplained muscle aches, loss of taste or smell, rash, N/V/D, abdominal pain, redness around/in the eye, increased weakness or unexplained bruising or bleeding.  Pt has been taking all of her pulmonary medications as prescribed with minimal relief. Pt would like further recommendations.  Tammy - please advise. Thanks.

## 2020-01-02 NOTE — Telephone Encounter (Signed)
LMTCB x1 for pt.  

## 2020-01-02 NOTE — Telephone Encounter (Signed)
Pt returning call.  [phone]

## 2020-01-02 NOTE — Telephone Encounter (Signed)
Spoke with pt. She is aware of Tammy's response. Pt has been taking Zyrtec, Flonase, Symbicort and Singulair as prescribed. She has been scheduled to see Dr. Carlis Abbott on 01/05/20 at 1545. Nothing further needed.

## 2020-01-05 ENCOUNTER — Other Ambulatory Visit: Payer: Self-pay

## 2020-01-05 ENCOUNTER — Encounter: Payer: Self-pay | Admitting: Critical Care Medicine

## 2020-01-05 ENCOUNTER — Ambulatory Visit (INDEPENDENT_AMBULATORY_CARE_PROVIDER_SITE_OTHER): Payer: BC Managed Care – PPO | Admitting: Critical Care Medicine

## 2020-01-05 VITALS — BP 150/90 | HR 114 | Temp 98.3°F | Ht 64.0 in | Wt 267.0 lb

## 2020-01-05 DIAGNOSIS — J4551 Severe persistent asthma with (acute) exacerbation: Secondary | ICD-10-CM | POA: Diagnosis not present

## 2020-01-05 MED ORDER — SPIRIVA RESPIMAT 1.25 MCG/ACT IN AERS: 2.0000 | INHALATION_SPRAY | Freq: Every day | RESPIRATORY_TRACT | 11 refills | Status: DC

## 2020-01-05 NOTE — Patient Instructions (Addendum)
Thank you for visiting Dr. Carlis Abbott at [facility]. We recommend the following:   Meds ordered this encounter  Medications  . Tiotropium Bromide Monohydrate (SPIRIVA RESPIMAT) 1.25 MCG/ACT AERS    Sig: Inhale 2 puffs into the lungs daily.    Dispense:  4 g    Refill:  11    Keep taking montelukast, zyrtec daily. Keep using Symbicort two times daily. Rinse your mouth after.  Return in about 26 days (around 01/31/2020).  PFTs on 01/31/20.    Please do your part to reduce the spread of COVID-19.

## 2020-01-05 NOTE — Progress Notes (Signed)
Synopsis: Referred in March 2021 for shortness of breath by Redmond School, MD  Subjective:   PATIENT ID: Heather Fry GENDER: female DOB: [birth year], MRN: [id number]  Chief Complaint  Patient presents with  . Follow-up    asthma.  Sob with exertion.  no cough.  lots of mucus, unable to cough up.  getting hoarse off and on.  Symbicort helping.  Using Albutorol inhaler 2 x day.    Heather Fry is a 44 year old woman with a history of recently diagnosed severe asthma.  She remains on montelukast, Zyrtec, Flonase, Symbicort twice daily.  Overall her symptoms have improved since her last visit, but last week she had a sudden episode of shortness of breath associated with being hot and having high blood pressure.  She thinks she was maybe triggered by the baby chickens they have living in their house.  She took her Symbicort and rested for a while, and after about an hour she could breathe better.  She had more sputum production and a raspy voice with coughing during this episode.  Last Fry she had a similar episode where she had tightness in her chest with shortness of breath and improved after using her albuterol.  She has an air purifier and has stayed in her room more to avoid allergens.  They have multiple pets, including reptiles, chickens live in the house currently, and outdoor cats.  She is no longer around her husband when he is vaping.  She has remained on steroids per her rheumatologist at [facility] since her last visit.  She is being evaluated for SLE.  She is more fatigued than normal for the past few weeks.  No significant nighttime symptoms; still using her CPAP.  She uses this more when she is short of breath with relief of her symptoms.     OV 12/13/19: Heather Fry is a 44 y/o woman with a history of Grave's disease s/p resection who was recently diagnosed with asthma in the ED 2 at [facility] on 12/04/19 who presents for evaluation.  She has a history of chronic allergies which have  worsened over time.  She began having breathing symptoms several weeks ago which progressively worsened.  She has nasal congestion, rhinorrhea, hoarseness, wheezing, severe chest tightness all the way around her chest, dyspnea on exertion, cough, thick sputum production.  Her symptoms are worse when she is hot.  Her husband vaping vapors worsen her symptoms.  She has well-controlled GERD on omeprazole.  She was previously taking Claritin.  When she presented to the ED a few weeks ago she was having laryngeal spasms at the same time.  She currently is taking albuterol multiple times per day with relief in her symptoms.  Prior to her ED visit she was taking Primatene.  When her breathing symptoms were originally being evaluated her endocrinologist checked a sed rate, which was increased.  At that time she was referred to [facility] rheumatology and was seen by Garen Grams.  At that time she was diagnosed with mixed connective tissue disease and started on Medrol dose pack.  She was subsequently started on Plaquenil, which had to be stopped due to an adverse reaction.  The diagnosis of mixed connective tissue disease is now in question and she has been tapered off steroids.  Tapering off steroids is what precipitated the asthma attack as her breathing continues to worsen as the steroids are decreased.  She has an appointment at [facility] with rheumatology in April for evaluation.  Is previously undergone cardiac work-up, which was negative.  She has severe OSA which is treated with autoPAP.  After her thyroidectomy she has had significant weight gain issues, and she would like to lose the excess weight she has gained.  She has a history of tobacco abuse in the past.  She urgently quit smoking 1999, but started again about 2 years ago her total 3 months prior to quitting permanently.  She has a family history of asthma in her paternal uncle.  ACT 11    Past Medical History:  Diagnosis Date  . Anxiety   .  Arthritis   . Depression   . Diverticulosis   . GERD (gastroesophageal reflux disease)   . Graves disease   . Internal hemorrhoids   . Thyroid disease   . Uterine fibroid      Family History  Problem Relation Age of Onset  . Hypertension Father   . Diabetes Father   . Depression Other   . Uterine cancer Mother        ?cervical cancer  . Uterine cancer Sister        suicide  . Colon cancer Paternal Aunt 69  . Thyroid disease Neg Hx      Past Surgical History:  Procedure Laterality Date  . ABDOMINAL HYSTERECTOMY     Per patient, uterine fibroids.  . ABDOMINAL HYSTERECTOMY    . CHOLECYSTECTOMY N/A 11/06/2014   Procedure: LAPAROSCOPIC CHOLECYSTECTOMY;  Surgeon: Jamesetta So, MD;  Location: [facility] ORS;  Service: General;  Laterality: N/A;  . COLONOSCOPY N/A 03/04/2017   Dr. Gala Romney: Hemorrhoids, diverticulosis, benign polyp without adenomatous changes.  Next colonoscopy 10 years  . DILATION AND CURETTAGE OF UTERUS    . ESOPHAGOGASTRODUODENOSCOPY N/A 10/02/2014   Dr. Gala Romney: fundic gland polyps, hiatal hernia  . MOUTH SURGERY     extraction of teeth  . POLYPECTOMY  03/04/2017   Procedure: POLYPECTOMY;  Surgeon: Daneil Dolin, MD;  Location: [facility] ENDO SUITE;  Service: Endoscopy;;  colon  . THYROIDECTOMY N/A 09/30/2018   Procedure: TOTAL THYROIDECTOMY;  Surgeon: Armandina Gemma, MD;  Location: [facility] ORS;  Service: General;  Laterality: N/A;  . TUBAL LIGATION      Social History   Socioeconomic History  . Marital status: Married    Spouse name: Not on file  . Number of children: Not on file  . Years of education: Not on file  . Highest education level: Not on file  Occupational History  . Not on file  Tobacco Use  . Smoking status: Former Smoker    Packs/day: 1.00    Years: 5.00    Pack years: 5.00    Types: Cigarettes    Quit date: 11/03/1997    Years since quitting: 22.1  . Smokeless tobacco: Never Used  . Tobacco comment: 1 pack 1 week  Substance and Sexual Activity  . Alcohol  use: No    Alcohol/week: 0.0 standard drinks  . Drug use: No  . Sexual activity: Yes    Birth control/protection: Surgical  Other Topics Concern  . Not on file  Social History Narrative  . Not on file   Social Determinants of Health   Financial Resource Strain:   . Difficulty of Paying Living Expenses:   Food Insecurity:   . Worried About Charity fundraiser in the Last Year:   . Arboriculturist in the Last Year:   Transportation Needs:   . Film/video editor (Medical):   Marland Kitchen Lack  of Transportation (Non-Medical):   Physical Activity:   . Days of Exercise per Week:   . Minutes of Exercise per Session:   Stress:   . Feeling of Stress :   Social Connections:   . Frequency of Communication with Friends and Family:   . Frequency of Social Gatherings with Friends and Family:   . Attends Religious Services:   . Active Member of Clubs or Organizations:   . Attends Archivist Meetings:   Marland Kitchen Marital Status:   Intimate Partner Violence:   . Fear of Current or Ex-Partner:   . Emotionally Abused:   Marland Kitchen Physically Abused:   . Sexually Abused:      Allergies  Allergen Reactions  . Levofloxacin Other (See Comments)    Pt can not have due to taking Lexapro   . Toradol [Ketorolac Tromethamine] Anaphylaxis  . Penicillins Other (See Comments)    Loopy, childhood allergy DID THE REACTION INVOLVE: Swelling of the face/tongue/throat, SOB, or low BP? Unknown Sudden or severe rash/hives, skin peeling, or the inside of the mouth or nose? Unknown Did it require medical treatment? Yes When did it last happen?Childhood allergy If all above answers are "NO", may proceed with cephalosporin use.      Immunization History  Administered Date(s) Administered  . Influenza-Unspecified 07/08/2019  . Pneumococcal Conjugate-13 07/07/2018    Outpatient Medications Prior to Visit  Medication Sig Dispense Refill  . acetaminophen (TYLENOL) 500 MG tablet Take 1,000 mg by mouth daily  as needed for moderate pain.    Marland Kitchen albuterol (VENTOLIN HFA) 108 (90 Base) MCG/ACT inhaler Inhale 1 puff into the lungs as needed.    Marland Kitchen albuterol (VENTOLIN HFA) 108 (90 Base) MCG/ACT inhaler Inhale 2 puffs into the lungs every 6 (six) hours as needed. 18 g 11  . budesonide-formoterol (SYMBICORT) 160-4.5 MCG/ACT inhaler Inhale 2 puffs into the lungs in the morning and at bedtime. 1 Inhaler 11  . cetirizine (ZYRTEC ALLERGY) 10 MG tablet Take 1 tablet (10 mg total) by mouth daily. 30 tablet 11  . Cholecalciferol (VITAMIN D3) 125 MCG (5000 UT) CAPS Take 1 capsule (5,000 Units total) by mouth daily. 30 capsule 11  . docusate sodium (COLACE) 100 MG capsule Take 100 mg by mouth at bedtime.    Marland Kitchen escitalopram (LEXAPRO) 20 MG tablet Take 1 tablet (20 mg total) by mouth at bedtime. 30 tablet 5  . fluticasone (FLONASE) 50 MCG/ACT nasal spray Place 2 sprays into both nostrils daily. 16 g 2  . Magnesium 250 MG TABS Take 1 tablet by mouth daily.    . methylPREDNISolone (MEDROL) 4 MG tablet Take 4 mg by mouth in the morning and at bedtime.    . montelukast (SINGULAIR) 10 MG tablet Take 1 tablet (10 mg total) by mouth at bedtime. 30 tablet 11  . NON FORMULARY at bedtime. CPAP at [facility]     . omeprazole (PRILOSEC) 20 MG capsule Take 20 mg by mouth 2 (two) times daily before a meal.    . simethicone (PHAZYME) 125 MG chewable tablet Chew 125-250 mg by mouth every 6 (six) hours as needed for flatulence.     Marland Kitchen SYNTHROID 150 MCG tablet Take 1 tablet (150 mcg total) by mouth daily before breakfast. 90 tablet 3  . Wheat Dextrin (BENEFIBER DRINK MIX PO) Take 1 Dose by mouth 3 (three) times daily.     . DULoxetine (CYMBALTA) 60 MG capsule Take 60 mg by mouth daily.     No facility-administered medications prior  to visit.    Review of Systems  Constitutional: Negative.   HENT: Positive for congestion.   Respiratory: Positive for cough, sputum production, shortness of breath and wheezing.        Chest tightness    Cardiovascular: Negative for chest pain and leg swelling.  Gastrointestinal: Negative for heartburn.  Endo/Heme/Allergies: Positive for environmental allergies.     Objective:   Vitals:   01/05/20 1544  BP: (!) 150/90  Pulse: (!) 114  Temp: 98.3 F (36.8 C)  TempSrc: Temporal  SpO2: 97%  Weight: 267 lb (121.1 kg)  Height: 5\' 4"  (1.626 m)   97% on   RA BMI Readings from Last 3 Encounters:  01/05/20 45.83 kg/m  12/28/19 46.59 kg/m  12/13/19 46.17 kg/m   Wt Readings from Last 3 Encounters:  01/05/20 267 lb (121.1 kg)  12/28/19 271 lb 6.4 oz (123.1 kg)  12/13/19 269 lb (122 kg)    Physical Exam Vitals reviewed.  Constitutional:      General: She is not in acute distress.    Appearance: She is obese. She is not ill-appearing.  HENT:     Head: Normocephalic and atraumatic.  Eyes:     General: No scleral icterus. Cardiovascular:     Rate and Rhythm: Normal rate and regular rhythm.     Heart sounds: No murmur.  Pulmonary:     Comments: Breathing comfortably on room air, mild tachypnea, but no accessory muscle use.  Able to speak in full sentences.  Clear to auscultation bilaterally.  No significant coughing during exam. Abdominal:     General: There is no distension.     Palpations: Abdomen is soft.     Tenderness: There is no abdominal tenderness.  Musculoskeletal:        General: No swelling or deformity.     Cervical back: Neck supple.  Lymphadenopathy:     Cervical: No cervical adenopathy.  Skin:    General: Skin is warm and dry.     Findings: No rash.  Neurological:     General: No focal deficit present.     Mental Status: She is alert.     Coordination: Coordination normal.  Psychiatric:        Mood and Affect: Mood normal.        Behavior: Behavior normal.      CBC    Component Value Date/Time   WBC 11.7 (H) 12/04/2019 2308   RBC 4.27 12/04/2019 2308   HGB 12.5 12/04/2019 2308   HCT 39.1 12/04/2019 2308   PLT 196 12/04/2019 2308   MCV  91.6 12/04/2019 2308   MCH 29.3 12/04/2019 2308   MCHC 32.0 12/04/2019 2308   RDW 14.0 12/04/2019 2308   LYMPHSABS 2.1 10/10/2019 2030   MONOABS 0.4 10/10/2019 2030   EOSABS 0.0 10/10/2019 2030   BASOSABS 0.0 10/10/2019 2030    CHEMISTRY No results for input(s): NA, K, CL, CO2, GLUCOSE, BUN, CREATININE, CALCIUM, MG, PHOS in the last 168 hours. CrCl cannot be calculated (Patient's most recent lab result is older than the maximum 21 days allowed.).  D-dimer 0.28 on 12/04/2019 BNP 24 on 12/04/2019  Chest Imaging- films reviewed: CXR, 1 view 12/04/2019-low lung volumes likely contributing to the appearance of widened mediastinum and cardiomegaly  CT abdomen pelvis 04/07/2019-lung images reviewed-possible reticular changes of dependent bilateral lower lobes.  Pulmonary Functions Testing Results: No flowsheet data found.    Echocardiogram 20/2021: LVEF 60 to 65%, no LVH.  Function.  Mildly dilated LA, normal  pressure.  Normal RV, RA.  Trivial MR.      Assessment & Plan:     ICD-10-CM   1. Severe persistent asthma with acute exacerbation  J45.51     Severe persistent asthma; suspect allergic asthma. -We will defer checking CBC with differential and IgE until she is off steroids. -Start Spiriva once daily.  Continue Symbicort twice daily -PFTs scheduled for early May.  She has follow-up the same day.  Strongly considering starting a biologic if PFTs confirm asthma. -Continue allergic rhinosinusitis management. -Strongly recommend moving chickens outside of the house and other pets that could be worsening her allergies. -Continue to avoid other environmental triggers such as vaping and dust. -Continue albuterol as needed  Allergic rhinosinusitis -Continue Zyrtec and Singulair daily -Continue Flonase daily  Obesity -Recommend modest weight loss   RTC in 1 month with PFTs.   Current Outpatient Medications:  .  acetaminophen (TYLENOL) 500 MG tablet, Take 1,000 mg by mouth daily  as needed for moderate pain., Disp: , Rfl:  .  albuterol (VENTOLIN HFA) 108 (90 Base) MCG/ACT inhaler, Inhale 1 puff into the lungs as needed., Disp: , Rfl:  .  albuterol (VENTOLIN HFA) 108 (90 Base) MCG/ACT inhaler, Inhale 2 puffs into the lungs every 6 (six) hours as needed., Disp: 18 g, Rfl: 11 .  budesonide-formoterol (SYMBICORT) 160-4.5 MCG/ACT inhaler, Inhale 2 puffs into the lungs in the morning and at bedtime., Disp: 1 Inhaler, Rfl: 11 .  cetirizine (ZYRTEC ALLERGY) 10 MG tablet, Take 1 tablet (10 mg total) by mouth daily., Disp: 30 tablet, Rfl: 11 .  Cholecalciferol (VITAMIN D3) 125 MCG (5000 UT) CAPS, Take 1 capsule (5,000 Units total) by mouth daily., Disp: 30 capsule, Rfl: 11 .  docusate sodium (COLACE) 100 MG capsule, Take 100 mg by mouth at bedtime., Disp: , Rfl:  .  escitalopram (LEXAPRO) 20 MG tablet, Take 1 tablet (20 mg total) by mouth at bedtime., Disp: 30 tablet, Rfl: 5 .  fluticasone (FLONASE) 50 MCG/ACT nasal spray, Place 2 sprays into both nostrils daily., Disp: 16 g, Rfl: 2 .  Magnesium 250 MG TABS, Take 1 tablet by mouth daily., Disp: , Rfl:  .  methylPREDNISolone (MEDROL) 4 MG tablet, Take 4 mg by mouth in the morning and at bedtime., Disp: , Rfl:  .  montelukast (SINGULAIR) 10 MG tablet, Take 1 tablet (10 mg total) by mouth at bedtime., Disp: 30 tablet, Rfl: 11 .  NON FORMULARY, at bedtime. CPAP at [facility] , Disp: , Rfl:  .  omeprazole (PRILOSEC) 20 MG capsule, Take 20 mg by mouth 2 (two) times daily before a meal., Disp: , Rfl:  .  simethicone (PHAZYME) 125 MG chewable tablet, Chew 125-250 mg by mouth every 6 (six) hours as needed for flatulence. , Disp: , Rfl:  .  SYNTHROID 150 MCG tablet, Take 1 tablet (150 mcg total) by mouth daily before breakfast., Disp: 90 tablet, Rfl: 3 .  Wheat Dextrin (BENEFIBER DRINK MIX PO), Take 1 Dose by mouth 3 (three) times daily. , Disp: , Rfl:  .  DULoxetine (CYMBALTA) 60 MG capsule, Take 60 mg by mouth daily., Disp: , Rfl:      Julian Hy, DO [facility] 01/05/2020 4:14 PM

## 2020-01-18 DIAGNOSIS — R1010 Upper abdominal pain, unspecified: Secondary | ICD-10-CM | POA: Diagnosis not present

## 2020-01-18 DIAGNOSIS — R140 Abdominal distension (gaseous): Secondary | ICD-10-CM | POA: Diagnosis not present

## 2020-01-18 DIAGNOSIS — K219 Gastro-esophageal reflux disease without esophagitis: Secondary | ICD-10-CM | POA: Diagnosis not present

## 2020-01-19 ENCOUNTER — Encounter (HOSPITAL_COMMUNITY): Payer: Self-pay

## 2020-01-19 ENCOUNTER — Other Ambulatory Visit: Payer: Self-pay

## 2020-01-19 ENCOUNTER — Observation Stay (HOSPITAL_COMMUNITY)
Admission: EM | Admit: 2020-01-19 | Discharge: 2020-01-20 | Disposition: A | Discharge status: Home / Self Care | Payer: BC Managed Care – PPO | Attending: Internal Medicine | Admitting: Internal Medicine

## 2020-01-19 ENCOUNTER — Emergency Department (HOSPITAL_COMMUNITY): Payer: BC Managed Care – PPO

## 2020-01-19 DIAGNOSIS — G4489 Other headache syndrome: Secondary | ICD-10-CM | POA: Diagnosis not present

## 2020-01-19 DIAGNOSIS — Z87891 Personal history of nicotine dependence: Secondary | ICD-10-CM | POA: Diagnosis not present

## 2020-01-19 DIAGNOSIS — R0689 Other abnormalities of breathing: Secondary | ICD-10-CM | POA: Diagnosis not present

## 2020-01-19 DIAGNOSIS — R200 Anesthesia of skin: Secondary | ICD-10-CM

## 2020-01-19 DIAGNOSIS — E039 Hypothyroidism, unspecified: Secondary | ICD-10-CM | POA: Diagnosis present

## 2020-01-19 DIAGNOSIS — I639 Cerebral infarction, unspecified: Secondary | ICD-10-CM

## 2020-01-19 DIAGNOSIS — R0789 Other chest pain: Secondary | ICD-10-CM | POA: Diagnosis not present

## 2020-01-19 DIAGNOSIS — J45909 Unspecified asthma, uncomplicated: Secondary | ICD-10-CM | POA: Insufficient documentation

## 2020-01-19 DIAGNOSIS — K219 Gastro-esophageal reflux disease without esophagitis: Secondary | ICD-10-CM | POA: Diagnosis present

## 2020-01-19 DIAGNOSIS — E66813 Obesity, class 3: Secondary | ICD-10-CM

## 2020-01-19 DIAGNOSIS — G4733 Obstructive sleep apnea (adult) (pediatric): Secondary | ICD-10-CM | POA: Insufficient documentation

## 2020-01-19 DIAGNOSIS — Z20822 Contact with and (suspected) exposure to covid-19: Secondary | ICD-10-CM | POA: Insufficient documentation

## 2020-01-19 DIAGNOSIS — R531 Weakness: Secondary | ICD-10-CM

## 2020-01-19 DIAGNOSIS — F329 Major depressive disorder, single episode, unspecified: Secondary | ICD-10-CM | POA: Diagnosis present

## 2020-01-19 DIAGNOSIS — R42 Dizziness and giddiness: Secondary | ICD-10-CM | POA: Diagnosis not present

## 2020-01-19 DIAGNOSIS — R7302 Impaired glucose tolerance (oral): Secondary | ICD-10-CM | POA: Diagnosis not present

## 2020-01-19 DIAGNOSIS — F32A Depression, unspecified: Secondary | ICD-10-CM | POA: Diagnosis present

## 2020-01-19 DIAGNOSIS — R209 Unspecified disturbances of skin sensation: Secondary | ICD-10-CM | POA: Diagnosis not present

## 2020-01-19 DIAGNOSIS — R29818 Other symptoms and signs involving the nervous system: Secondary | ICD-10-CM | POA: Diagnosis not present

## 2020-01-19 DIAGNOSIS — R079 Chest pain, unspecified: Secondary | ICD-10-CM

## 2020-01-19 DIAGNOSIS — R202 Paresthesia of skin: Secondary | ICD-10-CM | POA: Diagnosis not present

## 2020-01-19 DIAGNOSIS — R519 Headache, unspecified: Secondary | ICD-10-CM | POA: Diagnosis not present

## 2020-01-19 LAB — DIFFERENTIAL
Abs Immature Granulocytes: 0.05 10*3/uL (ref 0.00–0.07)
Basophils Absolute: 0 10*3/uL (ref 0.0–0.1)
Basophils Relative: 0 %
Eosinophils Absolute: 0.1 10*3/uL (ref 0.0–0.5)
Eosinophils Relative: 1 %
Immature Granulocytes: 1 %
Lymphocytes Relative: 21 %
Lymphs Abs: 2.2 10*3/uL (ref 0.7–4.0)
Monocytes Absolute: 0.5 10*3/uL (ref 0.1–1.0)
Monocytes Relative: 5 %
Neutro Abs: 8 10*3/uL — ABNORMAL HIGH (ref 1.7–7.7)
Neutrophils Relative %: 72 %

## 2020-01-19 LAB — COMPREHENSIVE METABOLIC PANEL
ALT: 24 U/L (ref 0–44)
AST: 17 U/L (ref 15–41)
Albumin: 3.7 g/dL (ref 3.5–5.0)
Alkaline Phosphatase: 50 U/L (ref 38–126)
Anion gap: 11 (ref 5–15)
BUN: 13 mg/dL (ref 6–20)
CO2: 25 mmol/L (ref 22–32)
Calcium: 9.4 mg/dL (ref 8.9–10.3)
Chloride: 102 mmol/L (ref 98–111)
Creatinine, Ser: 0.76 mg/dL (ref 0.44–1.00)
GFR calc Af Amer: >60 mL/min (ref >60)
GFR calc non Af Amer: >60 mL/min (ref >60)
Glucose, Bld: 106 mg/dL — ABNORMAL HIGH (ref 70–99)
Potassium: 3.9 mmol/L (ref 3.5–5.1)
Sodium: 138 mmol/L (ref 135–145)
Total Bilirubin: 0.4 mg/dL (ref 0.3–1.2)
Total Protein: 7.6 g/dL (ref 6.5–8.1)

## 2020-01-19 LAB — I-STAT CHEM 8, ED
BUN: 12 mg/dL (ref 6–20)
Calcium, Ion: 1.25 mmol/L (ref 1.15–1.40)
Chloride: 101 mmol/L (ref 98–111)
Creatinine, Ser: 0.7 mg/dL (ref 0.44–1.00)
Glucose, Bld: 104 mg/dL — ABNORMAL HIGH (ref 70–99)
HCT: 39 % (ref 36.0–46.0)
Hemoglobin: 13.3 g/dL (ref 12.0–15.0)
Potassium: 3.9 mmol/L (ref 3.5–5.1)
Sodium: 138 mmol/L (ref 135–145)
TCO2: 27 mmol/L (ref 22–32)

## 2020-01-19 LAB — CBC
HCT: 40.6 % (ref 36.0–46.0)
Hemoglobin: 13.2 g/dL (ref 12.0–15.0)
MCH: 29.3 pg (ref 26.0–34.0)
MCHC: 32.5 g/dL (ref 30.0–36.0)
MCV: 90 fL (ref 80.0–100.0)
Platelets: 268 10*3/uL (ref 150–400)
RBC: 4.51 MIL/uL (ref 3.87–5.11)
RDW: 14.5 % (ref 11.5–15.5)
WBC: 10.9 10*3/uL — ABNORMAL HIGH (ref 4.0–10.5)
nRBC: 0 % (ref 0.0–0.2)

## 2020-01-19 LAB — URINALYSIS, ROUTINE W REFLEX MICROSCOPIC
Bilirubin Urine: NEGATIVE
Glucose, UA: NEGATIVE mg/dL
Hgb urine dipstick: NEGATIVE
Ketones, ur: NEGATIVE mg/dL
Leukocytes,Ua: NEGATIVE
Nitrite: NEGATIVE
Protein, ur: NEGATIVE mg/dL
Specific Gravity, Urine: 1.013 (ref 1.005–1.030)
pH: 9 — ABNORMAL HIGH (ref 5.0–8.0)

## 2020-01-19 LAB — RAPID URINE DRUG SCREEN, HOSP PERFORMED
Amphetamines: NOT DETECTED
Barbiturates: NOT DETECTED
Benzodiazepines: NOT DETECTED
Cocaine: NOT DETECTED
Opiates: NOT DETECTED
Tetrahydrocannabinol: NOT DETECTED

## 2020-01-19 LAB — RESPIRATORY PANEL BY RT PCR (FLU A&B, COVID)
Influenza A by PCR: NEGATIVE
Influenza B by PCR: NEGATIVE
SARS Coronavirus 2 by RT PCR: NEGATIVE

## 2020-01-19 LAB — APTT: aPTT: 32 seconds (ref 24–36)

## 2020-01-19 LAB — PROTIME-INR
INR: 1 (ref 0.8–1.2)
Prothrombin Time: 12.8 seconds (ref 11.4–15.2)

## 2020-01-19 LAB — TSH: TSH: 2.374 u[IU]/mL (ref 0.350–4.500)

## 2020-01-19 LAB — VITAMIN B12: Vitamin B-12: 851 pg/mL (ref 180–914)

## 2020-01-19 LAB — ETHANOL: Alcohol, Ethyl (B): <10 mg/dL (ref <10)

## 2020-01-19 LAB — CBG MONITORING, ED: Glucose-Capillary: 101 mg/dL — ABNORMAL HIGH (ref 70–99)

## 2020-01-19 IMAGING — CT CT HEAD CODE STROKE
3 series · 15 of 47 positions shown, 18 images · non-contrast
Comparison: None.

CLINICAL DATA: Code stroke. Right-sided weakness. Headache and
diplopia.

EXAM:
CT HEAD WITHOUT CONTRAST
TECHNIQUE: Contiguous axial images were obtained from the base of the skull
through the vertex without intravenous contrast.

[Series 2: head w o · axial · 0.54mm/px · z∈[+1293,+1438]mm · 9 of 35 slices shown, 12 images]
[im 3/35  brain]
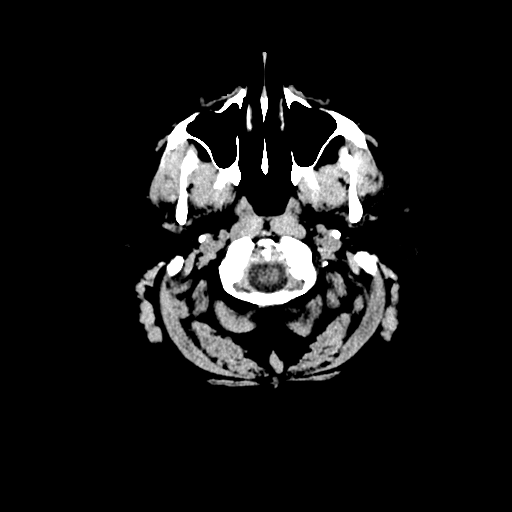
[im 3/35  bone]
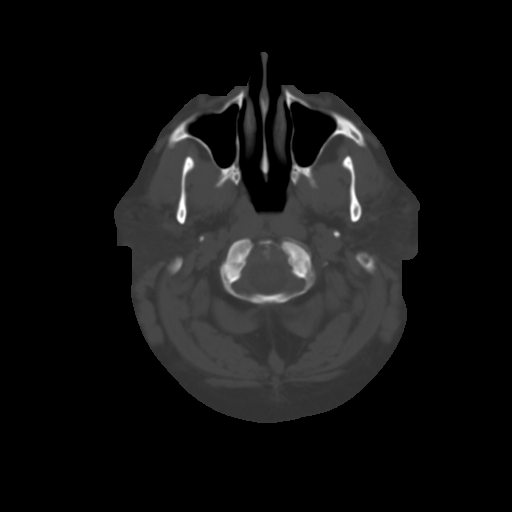
[im 6/35  brain]
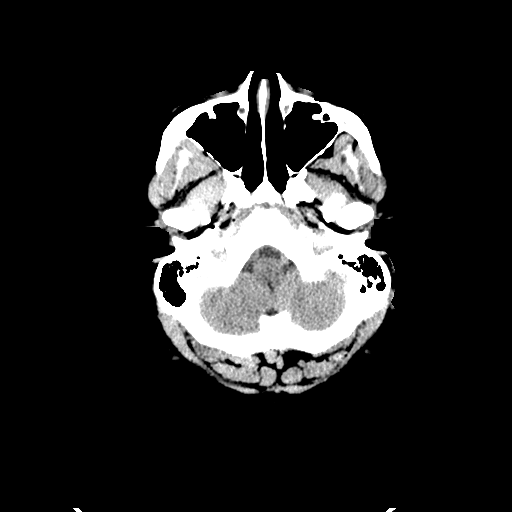
[im 10/35  brain]
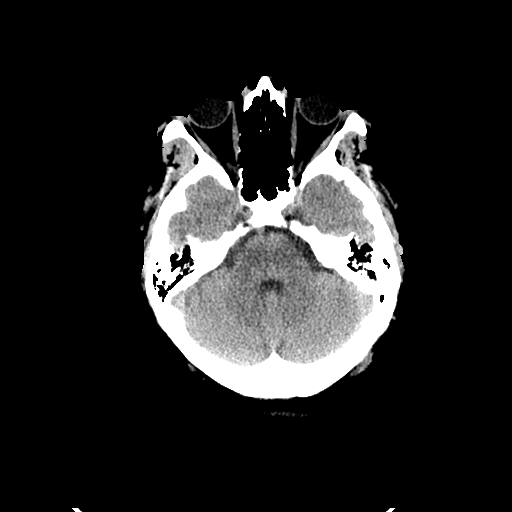
[im 13/35  brain]
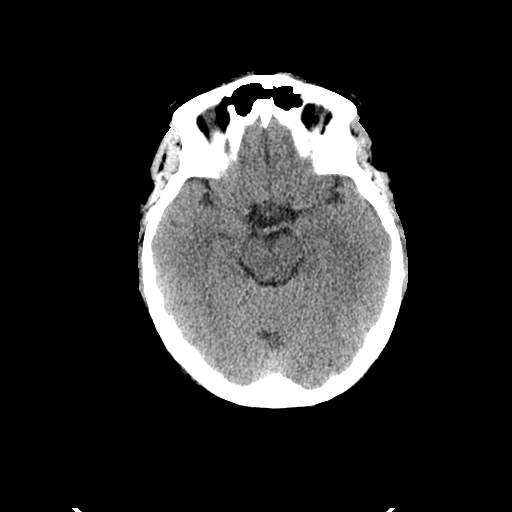
[im 18/35  brain]
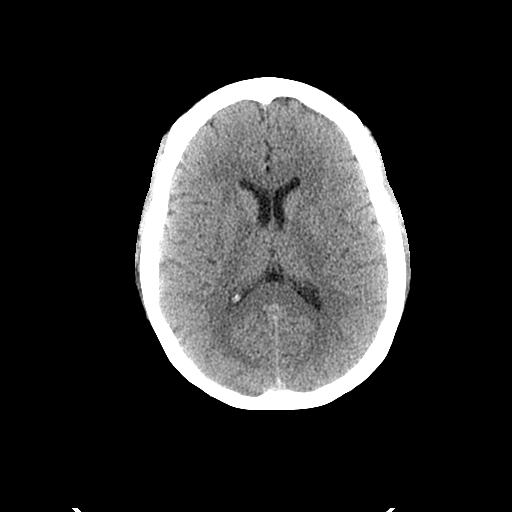
[im 18/35  bone]
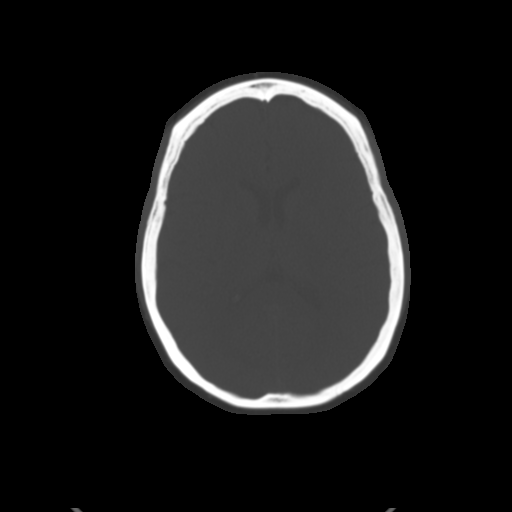
[im 22/35  brain]
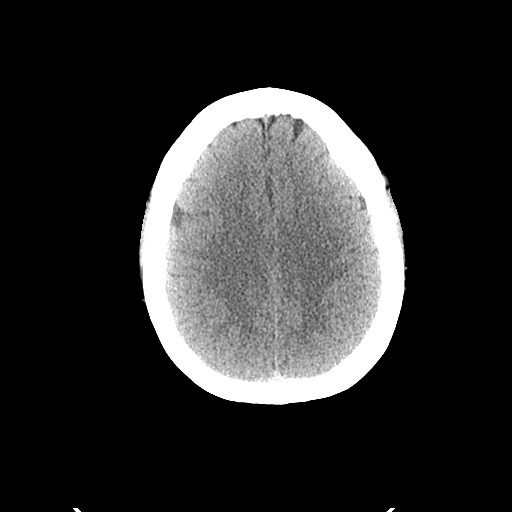
[im 25/35  brain]
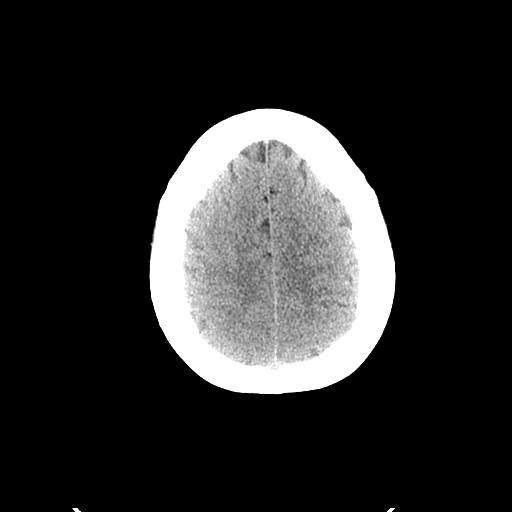
[im 29/35  brain]
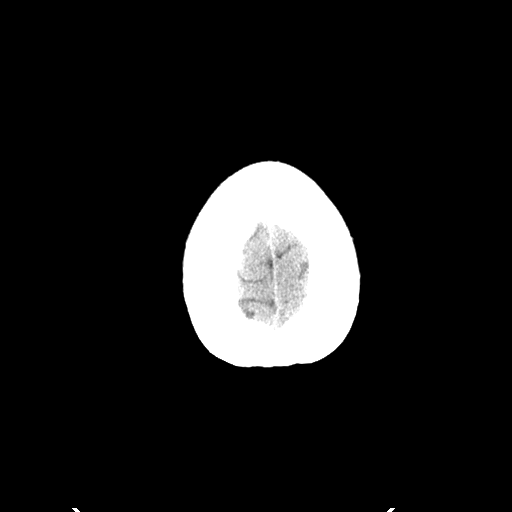
[im 32/35  brain]
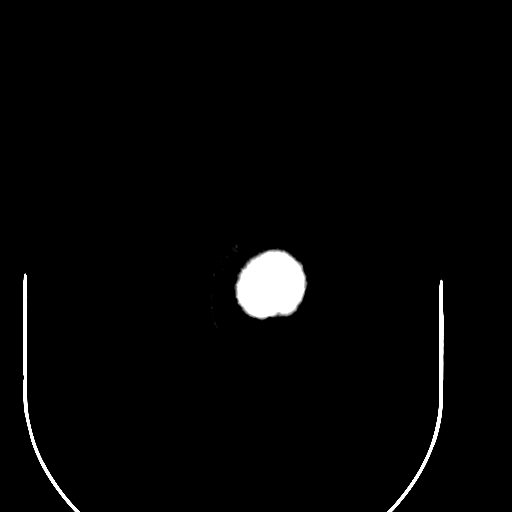
[im 32/35  bone]
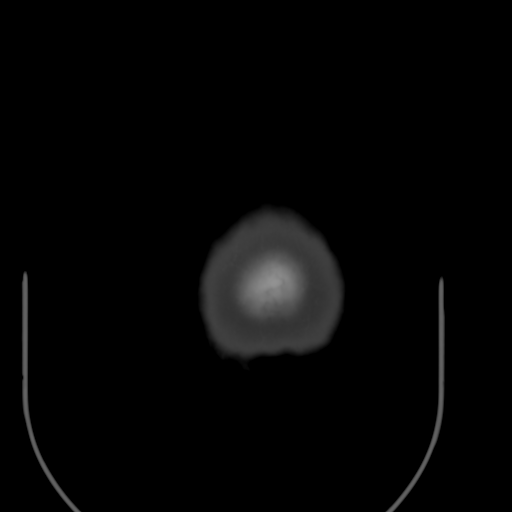

[Series 4: coronal soft · coronal · 0.35mm/px · 3 of 73 slices shown]
[im 25/73  brain]
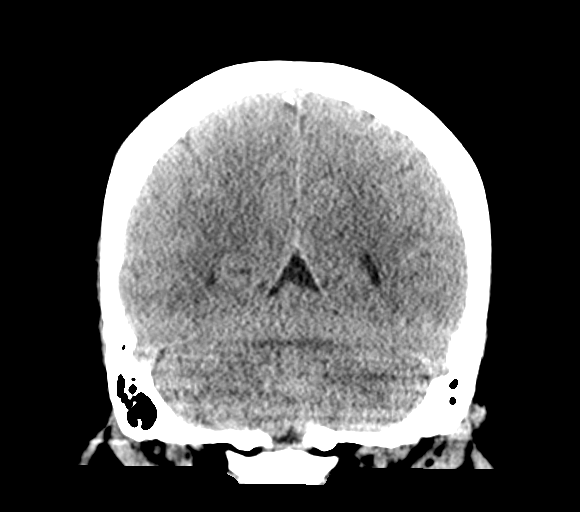
[im 33/73  brain]
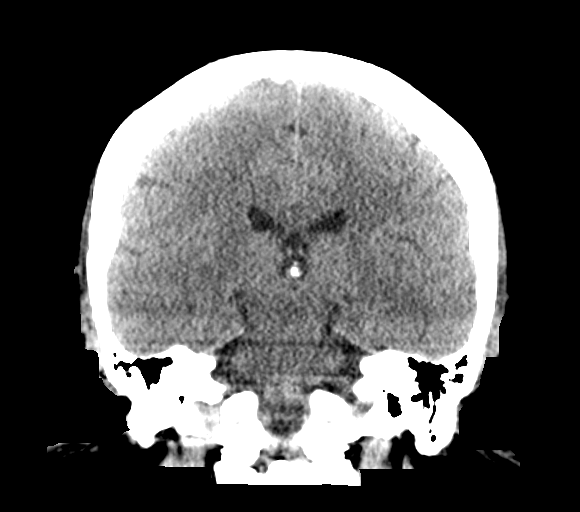
[im 41/73  brain]
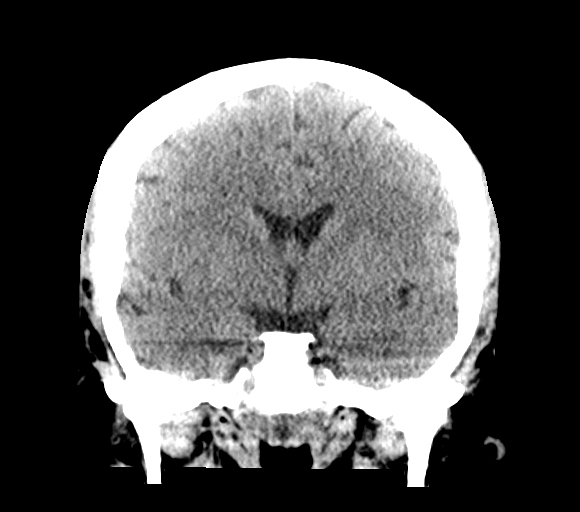

[Series 5: sagittal soft · sagittal · 0.36mm/px · 3 of 65 slices shown]
[im 22/65  brain]
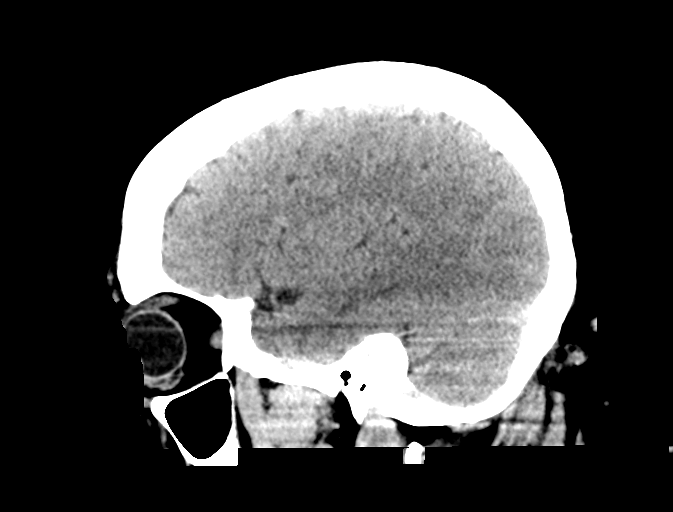
[im 33/65  brain]
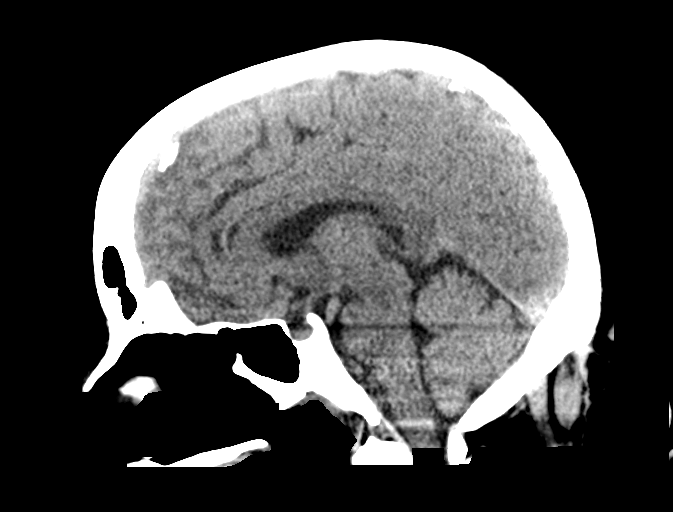
[im 43/65  brain]
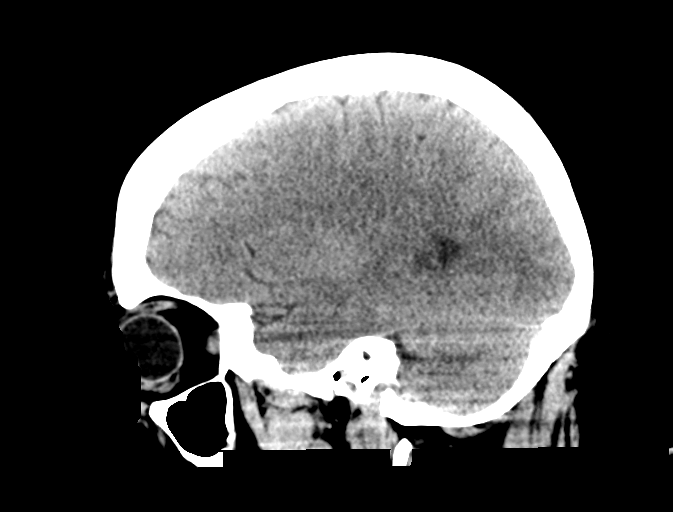

[15 of 47 positions shown; findings below may reference images not displayed]

FINDINGS: Brain: There is no mass, hemorrhage or extra-axial collection. The
size and configuration of the ventricles and extra-axial CSF spaces
are normal. The brain parenchyma is normal, without evidence of
acute or chronic infarction.

Vascular: No abnormal hyperdensity of the major intracranial
arteries or dural venous sinuses. No intracranial atherosclerosis.

Skull: The visualized skull base, calvarium and extracranial soft
tissues are normal.

Sinuses/Orbits: No fluid levels or advanced mucosal thickening of
the visualized paranasal sinuses. No mastoid or middle ear effusion.
The orbits are normal.

ASPECTS (Alberta Stroke Program Early CT Score)

- Ganglionic level infarction (caudate, lentiform nuclei, internal
capsule, insula, M1-M3 cortex): 7

- Supraganglionic infarction (M4-M6 cortex): 3

Total score (0-10 with 10 being normal): 10
IMPRESSION: 1. Normal head CT.
2. ASPECTS is 10.
* These results were called by telephone at the time of
interpretation on [DATE] at [DATE] to provider MONDY ,
who verbally acknowledged these results.

## 2020-01-19 MED ORDER — ACETAMINOPHEN 650 MG RE SUPP: 650.0000 mg | RECTAL | Status: DC | PRN

## 2020-01-19 MED ORDER — FLUTICASONE FUROATE-VILANTEROL 200-25 MCG/INH IN AEPB
1.0000 | INHALATION_SPRAY | Freq: Every day | RESPIRATORY_TRACT | Status: DC
  Administered 2020-01-20: 1 via RESPIRATORY_TRACT
  Filled 2020-01-19: qty 28

## 2020-01-19 MED ORDER — ALBUTEROL SULFATE HFA 108 (90 BASE) MCG/ACT IN AERS: 2.0000 | INHALATION_SPRAY | RESPIRATORY_TRACT | Status: DC | PRN

## 2020-01-19 MED ORDER — LORATADINE 10 MG PO TABS
10.0000 mg | ORAL_TABLET | Freq: Every day | ORAL | Status: DC
  Filled 2020-01-19: qty 1

## 2020-01-19 MED ORDER — SENNOSIDES-DOCUSATE SODIUM 8.6-50 MG PO TABS: 1.0000 | ORAL_TABLET | Freq: Every evening | ORAL | Status: DC | PRN

## 2020-01-19 MED ORDER — DULOXETINE HCL 60 MG PO CPEP
60.0000 mg | ORAL_CAPSULE | Freq: Every day | ORAL | Status: DC
  Filled 2020-01-19: qty 1

## 2020-01-19 MED ORDER — MONTELUKAST SODIUM 10 MG PO TABS
10.0000 mg | ORAL_TABLET | Freq: Every day | ORAL | Status: DC
  Administered 2020-01-20: 10 mg via ORAL
  Filled 2020-01-19: qty 1

## 2020-01-19 MED ORDER — STROKE: EARLY STAGES OF RECOVERY BOOK: Freq: Once | Status: AC

## 2020-01-19 MED ORDER — FLUTICASONE PROPIONATE 50 MCG/ACT NA SUSP
2.0000 | Freq: Every day | NASAL | Status: DC
  Administered 2020-01-20: 2 via NASAL
  Filled 2020-01-19: qty 16

## 2020-01-19 MED ORDER — VITAMIN D 25 MCG (1000 UNIT) PO TABS
5000.0000 [IU] | ORAL_TABLET | Freq: Every day | ORAL | Status: DC
  Filled 2020-01-19: qty 5

## 2020-01-19 MED ORDER — ACETAMINOPHEN 160 MG/5ML PO SOLN: 650.0000 mg | ORAL | Status: DC | PRN

## 2020-01-19 MED ORDER — UMECLIDINIUM BROMIDE 62.5 MCG/INH IN AEPB
1.0000 | INHALATION_SPRAY | Freq: Every day | RESPIRATORY_TRACT | Status: DC
  Administered 2020-01-20: 1 via RESPIRATORY_TRACT
  Filled 2020-01-19: qty 7

## 2020-01-19 MED ORDER — MAGNESIUM OXIDE 400 (241.3 MG) MG PO TABS
400.0000 mg | ORAL_TABLET | Freq: Every day | ORAL | Status: DC
  Filled 2020-01-19: qty 1

## 2020-01-19 MED ORDER — PANTOPRAZOLE SODIUM 40 MG PO TBEC
40.0000 mg | DELAYED_RELEASE_TABLET | Freq: Every day | ORAL | Status: DC
  Filled 2020-01-19: qty 1

## 2020-01-19 MED ORDER — LEVOTHYROXINE SODIUM 75 MCG PO TABS
150.0000 ug | ORAL_TABLET | Freq: Every day | ORAL | Status: DC
  Administered 2020-01-20: 150 ug via ORAL
  Filled 2020-01-19: qty 2

## 2020-01-19 MED ORDER — ACETAMINOPHEN 325 MG PO TABS
650.0000 mg | ORAL_TABLET | ORAL | Status: DC | PRN
  Administered 2020-01-20: 650 mg via ORAL
  Filled 2020-01-19: qty 2

## 2020-01-19 MED ORDER — DOCUSATE SODIUM 100 MG PO CAPS
100.0000 mg | ORAL_CAPSULE | Freq: Every day | ORAL | Status: DC
  Administered 2020-01-20: 100 mg via ORAL
  Filled 2020-01-19: qty 1

## 2020-01-19 MED ORDER — METHYLPREDNISOLONE 4 MG PO TABS
4.0000 mg | ORAL_TABLET | Freq: Two times a day (BID) | ORAL | Status: DC
  Administered 2020-01-20 (×2): 4 mg via ORAL
  Filled 2020-01-19 (×2): qty 1

## 2020-01-19 MED ORDER — ESCITALOPRAM OXALATE 10 MG PO TABS
20.0000 mg | ORAL_TABLET | Freq: Every day | ORAL | Status: DC
  Administered 2020-01-20: 20 mg via ORAL
  Filled 2020-01-19: qty 2

## 2020-01-19 MED ORDER — PSYLLIUM 95 % PO PACK
1.0000 | PACK | Freq: Three times a day (TID) | ORAL | Status: DC
  Administered 2020-01-20: 1 via ORAL
  Filled 2020-01-19 (×2): qty 1

## 2020-01-19 MED ORDER — ENOXAPARIN SODIUM 40 MG/0.4ML ~~LOC~~ SOLN
40.0000 mg | Freq: Every day | SUBCUTANEOUS | Status: DC
  Administered 2020-01-20: 40 mg via SUBCUTANEOUS
  Filled 2020-01-19: qty 0.4

## 2020-01-19 NOTE — ED Notes (Signed)
ED Provider at bedside. 

## 2020-01-19 NOTE — ED Provider Notes (Signed)
[facility] EMERGENCY DEPARTMENT Provider Note   CSN: [id number] Arrival date & time: 01/19/20  1941  An emergency department physician performed an initial assessment on this suspected stroke patient at 31.  History Chief Complaint  Patient presents with  . Code Stroke    Heather Fry is a 44 y.o. female.  Patient to be referred by primary care doctor to [facility] neurology.  With some concerns for possible MS.  For the past several months patient's been having some numbness around her eyes some eye twitching some numbness right hand occasionally some numbness to right arm.  But today at 345 had new onset of symptoms where her whole right side went numb and had bilateral lower extremity weakness.  Also associated with a mild but not severe headache.  Patient's past medical history significant for anxiety depression thyroid disease and gastroesophageal reflux disease.  Patient arrived here at approximately the 4-hour mark from last seen normal.  Code stroke activated.        Past Medical History:  Diagnosis Date  . Anxiety   . Arthritis   . Depression   . Diverticulosis   . GERD (gastroesophageal reflux disease)   . Graves disease   . Internal hemorrhoids   . Thyroid disease   . Uterine fibroid     Patient Active Problem List   Diagnosis Date Noted  . Hyperglycemia 10/04/2019  . Myalgia 09/05/2019  . Vitamin B 12 deficiency 07/28/2019  . Numbness 06/29/2019  . Constipation 06/15/2019  . Elevated lipase 06/15/2019  . Hypomagnesemia 03/29/2019  . Leukocytosis 03/29/2019  . Abdominal pain 02/10/2019  . Nausea without vomiting 02/10/2019  . Tingling of face 11/30/2018  . Flushing 11/30/2018  . Vitamin D deficiency 10/06/2018  . GERD (gastroesophageal reflux disease) 01/28/2017  . Rectal bleeding 01/28/2017  . 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 Hypothyroidism 04/14/2016  . Gastric polyp   . RUQ abdominal pain 09/06/2014  . Excessive or frequent menstruation  01/13/2013  . Depression     Past Surgical History:  Procedure Laterality Date  . ABDOMINAL HYSTERECTOMY     Per patient, uterine fibroids.  . ABDOMINAL HYSTERECTOMY    . CHOLECYSTECTOMY N/A 11/06/2014   Procedure: LAPAROSCOPIC CHOLECYSTECTOMY;  Surgeon: Jamesetta So, MD;  Location: [facility] ORS;  Service: General;  Laterality: N/A;  . COLONOSCOPY N/A 03/04/2017   Dr. Gala Romney: Hemorrhoids, diverticulosis, benign polyp without adenomatous changes.  Next colonoscopy 10 years  . DILATION AND CURETTAGE OF UTERUS    . ESOPHAGOGASTRODUODENOSCOPY N/A 10/02/2014   Dr. Gala Romney: fundic gland polyps, hiatal hernia  . MOUTH SURGERY     extraction of teeth  . POLYPECTOMY  03/04/2017   Procedure: POLYPECTOMY;  Surgeon: Daneil Dolin, MD;  Location: [facility] ENDO SUITE;  Service: Endoscopy;;  colon  . THYROIDECTOMY N/A 09/30/2018   Procedure: TOTAL THYROIDECTOMY;  Surgeon: Armandina Gemma, MD;  Location: [facility] ORS;  Service: General;  Laterality: N/A;  . TUBAL LIGATION       OB History    Gravida  2   Para  2   Term  2   Preterm      AB      Living  2     SAB      TAB      Ectopic      Multiple      Live Births              Family History  Problem Relation Age of Onset  . Hypertension  Father   . Diabetes Father   . Depression Other   . Uterine cancer Mother        ?cervical cancer  . Uterine cancer Sister        suicide  . Colon cancer Paternal Aunt 99  . Thyroid disease Neg Hx     Social History   Tobacco Use  . Smoking status: Former Smoker    Packs/day: 1.00    Years: 5.00    Pack years: 5.00    Types: Cigarettes    Quit date: 11/03/1997    Years since quitting: 22.2  . Smokeless tobacco: Never Used  . Tobacco comment: 1 pack 1 week  Substance Use Topics  . Alcohol use: No    Alcohol/week: 0.0 standard drinks  . Drug use: No    Home Medications Prior to Admission medications   Medication Sig Start Date End Date Taking? Authorizing Provider  acetaminophen (TYLENOL) 500 MG  tablet Take 1,000 mg by mouth daily as needed for moderate pain.    [provider]  albuterol (VENTOLIN HFA) 108 (90 Base) MCG/ACT inhaler Inhale 1 puff into the lungs as needed. 12/05/19   [provider]  albuterol (VENTOLIN HFA) 108 (90 Base) MCG/ACT inhaler Inhale 2 puffs into the lungs every 6 (six) hours as needed. 12/13/19   Julian Hy, DO  budesonide-formoterol (SYMBICORT) 160-4.5 MCG/ACT inhaler Inhale 2 puffs into the lungs in the morning and at bedtime. 12/13/19   Julian Hy, DO  cetirizine (ZYRTEC ALLERGY) 10 MG tablet Take 1 tablet (10 mg total) by mouth daily. 12/13/19   Julian Hy, DO  Cholecalciferol (VITAMIN D3) 125 MCG (5000 UT) CAPS Take 1 capsule (5,000 Units total) by mouth daily. 10/13/19   Renato Shin, MD  docusate sodium (COLACE) 100 MG capsule Take 100 mg by mouth at bedtime.    [provider]  DULoxetine (CYMBALTA) 60 MG capsule Take 60 mg by mouth daily.    [provider]  escitalopram (LEXAPRO) 20 MG tablet Take 1 tablet (20 mg total) by mouth at bedtime. 01/26/19   Renato Shin, MD  fluticasone (FLONASE) 50 MCG/ACT nasal spray Place 2 sprays into both nostrils daily. 12/13/19   Julian Hy, DO  Magnesium 250 MG TABS Take 1 tablet by mouth daily.    [provider]  methylPREDNISolone (MEDROL) 4 MG tablet Take 4 mg by mouth in the morning and at bedtime.    [provider]  montelukast (SINGULAIR) 10 MG tablet Take 1 tablet (10 mg total) by mouth at bedtime. 12/13/19   Julian Hy, DO  NON FORMULARY at bedtime. CPAP at night     [provider]  omeprazole (PRILOSEC) 20 MG capsule Take 20 mg by mouth 2 (two) times daily before a meal.    [provider]  simethicone (PHAZYME) 125 MG chewable tablet Chew 125-250 mg by mouth every 6 (six) hours as needed for flatulence.     [provider]  SYNTHROID 150 MCG tablet Take 1 tablet (150 mcg total) by mouth daily before breakfast.  10/04/19   Renato Shin, MD  Tiotropium Bromide Monohydrate (SPIRIVA RESPIMAT) 1.25 MCG/ACT AERS Inhale 2 puffs into the lungs daily. 01/05/20   Julian Hy, DO  Wheat Dextrin (BENEFIBER DRINK MIX PO) Take 1 Dose by mouth 3 (three) times daily.     [provider]    Allergies    Levofloxacin, Toradol [ketorolac tromethamine], and Penicillins  Review of Systems  Review of Systems  Constitutional: Negative for chills and fever.  HENT: Negative for congestion, rhinorrhea and sore throat.   Eyes: Negative for visual disturbance.  Respiratory: Negative for cough and shortness of breath.   Cardiovascular: Negative for chest pain and leg swelling.  Gastrointestinal: Negative for abdominal pain, diarrhea, nausea and vomiting.  Genitourinary: Negative for dysuria.  Musculoskeletal: Negative for back pain and neck pain.  Skin: Negative for rash.  Neurological: Positive for weakness, numbness and headaches. Negative for dizziness and light-headedness.  Hematological: Does not bruise/bleed easily.  Psychiatric/Behavioral: Negative for confusion.    Physical Exam Updated Vital Signs BP 133/80   Pulse 77   Temp 98.4 F (36.9 C) (Oral)   Resp 12   Wt 125.2 kg   LMP 10/09/2014   SpO2 100%   BMI 47.38 kg/m   Physical Exam Vitals and nursing note reviewed.  Constitutional:      General: She is not in acute distress.    Appearance: Normal appearance. She is well-developed.  HENT:     Head: Normocephalic and atraumatic.  Eyes:     Extraocular Movements: Extraocular movements intact.     Conjunctiva/sclera: Conjunctivae normal.     Pupils: Pupils are equal, round, and reactive to light.  Cardiovascular:     Rate and Rhythm: Normal rate and regular rhythm.     Heart sounds: No murmur.  Pulmonary:     Effort: Pulmonary effort is normal. No respiratory distress.     Breath sounds: Normal breath sounds.  Abdominal:     Palpations: Abdomen is soft.     Tenderness: There is no  abdominal tenderness.  Musculoskeletal:     Cervical back: Normal range of motion and neck supple.  Skin:    General: Skin is warm and dry.     Capillary Refill: Capillary refill takes less than 2 seconds.  Neurological:     Mental Status: She is alert.     Cranial Nerves: Cranial nerve deficit present.     Sensory: Sensory deficit present.     Motor: Weakness present.     Comments: Patient seems to have some bilateral lower extremity weakness.  Patient seems to have right-sided numbness.  And numbness to the right side of the face prickly around the right eye.     ED Results / Procedures / Treatments   Labs (all labs ordered are listed, but only abnormal results are displayed) Labs Reviewed  CBC - Abnormal; Notable for the following components:      Result Value   WBC 10.9 (*)    All other components within normal limits  DIFFERENTIAL - Abnormal; Notable for the following components:   Neutro Abs 8.0 (*)    All other components within normal limits  COMPREHENSIVE METABOLIC PANEL - Abnormal; Notable for the following components:   Glucose, Bld 106 (*)    All other components within normal limits  URINALYSIS, ROUTINE W REFLEX MICROSCOPIC - Abnormal; Notable for the following components:   APPearance CLOUDY (*)    pH 9.0 (*)    All other components within normal limits  I-STAT CHEM 8, ED - Abnormal; Notable for the following components:   Glucose, Bld 104 (*)    All other components within normal limits  CBG MONITORING, ED - Abnormal; Notable for the following components:   Glucose-Capillary 101 (*)    All other components within normal limits  RESPIRATORY PANEL BY RT PCR (FLU A&B, COVID)  ETHANOL  PROTIME-INR  APTT  RAPID  URINE DRUG SCREEN, HOSP PERFORMED    EKG EKG Interpretation  Date/Time:  Thursday January 19 2020 19:59:52 EDT Ventricular Rate:  82 PR Interval:    QRS Duration: 75 QT Interval:  364 QTC Calculation: 426 R Axis:   52 Text Interpretation: Sinus  rhythm Low voltage, precordial leads No significant change since last tracing Confirmed by Fredia Sorrow [phone]) on 01/19/2020 8:13:04 PM   Radiology CT HEAD CODE STROKE WO CONTRAST  Result Date: 01/19/2020 CLINICAL DATA:  Code stroke. Right-sided weakness. Headache and diplopia. EXAM: CT HEAD WITHOUT CONTRAST TECHNIQUE: Contiguous axial images were obtained from the base of the skull through the vertex without intravenous contrast. COMPARISON:  None. FINDINGS: Brain: There is no mass, hemorrhage or extra-axial collection. The size and configuration of the ventricles and extra-axial CSF spaces are normal. The brain parenchyma is normal, without evidence of acute or chronic infarction. Vascular: No abnormal hyperdensity of the major intracranial arteries or dural venous sinuses. No intracranial atherosclerosis. Skull: The visualized skull base, calvarium and extracranial soft tissues are normal. Sinuses/Orbits: No fluid levels or advanced mucosal thickening of the visualized paranasal sinuses. No mastoid or middle ear effusion. The orbits are normal. ASPECTS [facility] Stroke Program Early CT Score) - Ganglionic level infarction (caudate, lentiform nuclei, internal capsule, insula, M1-M3 cortex): 7 - Supraganglionic infarction (M4-M6 cortex): 3 Total score (0-10 with 10 being normal): 10 IMPRESSION: 1. Normal head CT. 2. ASPECTS is 10. * These results were called by telephone at the time of interpretation on 01/19/2020 at 8:00 pm to provider Fredia Sorrow , who verbally acknowledged these results. Electronically Signed   By: Ulyses Jarred M.D.   On: 01/19/2020 20:01    Procedures Procedures (including critical care time)  CRITICAL CARE Performed by: Fredia Sorrow Total critical care time: 35 minutes Critical care time was exclusive of separately billable procedures and treating other patients. Critical care was necessary to treat or prevent imminent or life-threatening deterioration. Critical care  was time spent personally by me on the following activities: development of treatment plan with patient and/or surrogate as well as nursing, discussions with consultants, evaluation of patient's response to treatment, examination of patient, obtaining history from patient or surrogate, ordering and performing treatments and interventions, ordering and review of laboratory studies, ordering and review of radiographic studies, pulse oximetry and re-evaluation of patient's condition.    Medications Ordered in ED Medications - No data to display  ED Course  I have reviewed the triage vital signs and the nursing notes.  Pertinent labs & imaging results that were available during my care of the patient were reviewed by me and considered in my medical decision making (see chart for details).    MDM Rules/Calculators/A&P                      Patient treated as a code stroke mostly fact that things started at 345.  Patient was approaching the 4-hour mark but was at the 4-hour mark when she arrived here.  Patient sent over for head CT based on code stroke protocol.  CT without any evidence of acute abnormality.  Seen by the teleneurologist.  Teleneurologist count of agreed with my assessment that some of this could be MS and most likely is however there is some degree they were stroke cannot be completely ruled out so he recommended admission and MRI in the morning.  Patient symptoms had improved and were kind of back to some baseline symptoms she has had for  the past few months.  Lab work-up without any significant abnormalities.  Covid testing pending.  Patient without any significant symptoms.  Discussed with hospitalist who will admit patient will get MRI in the morning.  Have on repeat neuro exam right prior to calling the hospitalist.  Patient's lower extremity weakness had resolved.  No evidence of any motor weakness at all.  Still had now more subjective numbness to the right hand and around the  right eye.    Final Clinical Impression(s) / ED Diagnoses Final diagnoses:  Cerebrovascular accident (CVA), unspecified mechanism [facility])    Rx / [place] Orders ED Discharge Orders    None       Fredia Sorrow, MD 01/23/20 [phone]

## 2020-01-19 NOTE — ED Notes (Signed)
CODE STROKE PAGED @ 1941 upon arrival to ED

## 2020-01-19 NOTE — H&P (Signed)
History and Physical    Patient Demographics:    Heather Fry [id number] DOB: [birth year] DOA: 01/19/2020  PCP: Redmond School, MD  Patient coming from: Home  I have personally briefly reviewed patient's old medical records in [place]  Chief Complaint: Weakness, left sided numbness   Assessment & Plan:     Assessment/Plan Principal Problem:   Right sided numbness Active Problems:   Depression   Hypothyroidism   GERD (gastroesophageal reflux disease)   Weakness     Principal Problem: Right-sided numbness, concern for demyelination syndrome, possible MS Patient presented with acute onset of right-sided numbness and bilateral lower extremity weakness starting around 3:45 PM.  Has been having right facial numbness for several months.  Also feels like she has some twitching around the right eye.  Lower extremity weakness had resolved after presentation to the ER. NIH score was 0.  CT head showed no evidence of acute infarct.  Patient was seen by teleneurology and was thought to have presentation more consistent with MS and less likely to be acute CVA. -Telemetry monitoring -Neurochecks every 4 hours -MRI brain, cervical spine with and without contrast -Neurology consult in a.m.  Other Active Problems:  Depression -Continue Cymbalta, Lexapro  Gastroesophageal flux disease -Continue omeprazole  Hypothyroidism -Continue Synthroid  Morbid obesity BMI 47 -Nutritional counseling, caloric restriction  Obstructive sleep apnea -Continue CPAP at night   DVT prophylaxis: Lovenox Code Status:  Full code Family Communication: N/A  Disposition Plan: Place in observation for possible TIA versus demyelination, will get MRI, neurology consult in a.m. Consults called: N/A Admission status: Observation stay    HPI:     HPI: Heather Fry is a 44 y.o. female with medical history significant of arthritis, anxiety, depression, GERD, Graves' disease who  presented to the ER with right-sided numbness.  Patient has apparently been having twitching around the eye as well as occasional numbness to the right side of the face.  This has been ongoing for the past several months.  She was initially thought to have possible lupus versus some current rheumatologic condition and was referred to a rheumatologist.  She says her ANA was positive but other work-up had been negative.  She had been placed on methylprednisolone with no significant improvement in her symptoms.  She was then referred by her primary care provider to a neurologist for an evaluation for possible MS but has not been seen by them yet.  At around 3:45 PM today she had acute onset of right-sided numbness where the whole right side of her body went numb and she also has some associated mild bilateral lower extremity weakness.  She also reported a mild headache at the time.  Patient arrived to the ER around 4 hours after onset of symptoms.  She was seen by the teleneurology service who did not believe that she was having an acute CVA but rather her symptoms suggested possible demyelination syndrome, possibly MS.  At the time of my evaluation, the patient's lower extremity weakness had resolved but she still reported having some numbness over the right hand as well as the right side of the face. No chest pain, fever, chills, shortness of breath, palpitations, cough, nausea, vomiting, abdominal pain, diarrhea, seizure activity, syncopal episode, blurring of vision.  ED Course:  Vital Signs reviewed on presentation, significant for temperature 98.4, heart rate 79, blood pressure 142/79, saturation 99% on room air. Labs reviewed, significant for sodium 138, potassium 3.9, BUN 12, creatinine 0.7, LFTs within normal  limits, WBC count 10.9, hemoglobin 13.2, hematocrit 40, platelets 268, PT, PTT within normal limits, flu PCR, SARS Covid RT-PCR is negative, urinalysis is negative, urine drug screen is negative,  alcohol level negative. Imaging personally Reviewed, CT of the head shows no acute abnormalities. EKG personally reviewed, shows sinus rhythm, no acute ST-T changes.    Review of systems:    Review of Systems: As per HPI otherwise 10 point review of systems negative.  All other review of systems is negative except the ones noted above in the HPI.    Past Medical and Surgical History:  Reviewed by me  Past Medical History:  Diagnosis Date  . Anxiety   . Arthritis   . Depression   . Diverticulosis   . GERD (gastroesophageal reflux disease)   . Graves disease   . Internal hemorrhoids   . Thyroid disease   . Uterine fibroid     Past Surgical History:  Procedure Laterality Date  . ABDOMINAL HYSTERECTOMY     Per patient, uterine fibroids.  . ABDOMINAL HYSTERECTOMY    . CHOLECYSTECTOMY N/A 11/06/2014   Procedure: LAPAROSCOPIC CHOLECYSTECTOMY;  Surgeon: Jamesetta So, MD;  Location: [facility];  Service: General;  Laterality: N/A;  . COLONOSCOPY N/A 03/04/2017   Dr. Gala Romney: Hemorrhoids, diverticulosis, benign polyp without adenomatous changes.  Next colonoscopy 10 years  . DILATION AND CURETTAGE OF UTERUS    . ESOPHAGOGASTRODUODENOSCOPY N/A 10/02/2014   Dr. Gala Romney: fundic gland polyps, hiatal hernia  . MOUTH SURGERY     extraction of teeth  . POLYPECTOMY  03/04/2017   Procedure: POLYPECTOMY;  Surgeon: Daneil Dolin, MD;  Location: [facility];  Service: Endoscopy;;  colon  . THYROIDECTOMY N/A 09/30/2018   Procedure: TOTAL THYROIDECTOMY;  Surgeon: Armandina Gemma, MD;  Location: [facility];  Service: General;  Laterality: N/A;  . TUBAL LIGATION       Social History:  Reviewed by me   reports that she quit smoking about 22 years ago. Her smoking use included cigarettes. She has a 5.00 pack-year smoking history. She has never used smokeless tobacco. She reports that she does not drink alcohol or use drugs.  Allergies:    Allergies  Allergen Reactions  . Levofloxacin Other (See Comments)      Pt can not have due to taking Lexapro   . Toradol [Ketorolac Tromethamine] Anaphylaxis  . Penicillins Other (See Comments)    Loopy, childhood allergy DID THE REACTION INVOLVE: Swelling of the face/tongue/throat, SOB, or low BP? Unknown Sudden or severe rash/hives, skin peeling, or the inside of the mouth or nose? Unknown Did it require medical treatment? Yes When did it last happen?Childhood allergy If all above answers are "NO", may proceed with cephalosporin use.     Family History :   Family History  Problem Relation Age of Onset  . Hypertension Father   . Diabetes Father   . Depression Other   . Uterine cancer Mother        ?cervical cancer  . Uterine cancer Sister        suicide  . Colon cancer Paternal Aunt 20  . Thyroid disease Neg Hx    Family history reviewed, noted as above, not pertinent to current presentation.   Home Medications:    Prior to Admission medications   Medication Sig Start Date End Date Taking? Authorizing Provider  acetaminophen (TYLENOL) 500 MG tablet Take 1,000 mg by mouth daily as needed for moderate pain.    [provider]  albuterol (VENTOLIN HFA) 108 (90 Base) MCG/ACT inhaler Inhale 1 puff into the lungs as needed. 12/05/19   [provider]  albuterol (VENTOLIN HFA) 108 (90 Base) MCG/ACT inhaler Inhale 2 puffs into the lungs every 6 (six) hours as needed. 12/13/19   Julian Hy, DO  budesonide-formoterol (SYMBICORT) 160-4.5 MCG/ACT inhaler Inhale 2 puffs into the lungs in the morning and at bedtime. 12/13/19   Julian Hy, DO  cetirizine (ZYRTEC ALLERGY) 10 MG tablet Take 1 tablet (10 mg total) by mouth daily. 12/13/19   Julian Hy, DO  Cholecalciferol (VITAMIN D3) 125 MCG (5000 UT) CAPS Take 1 capsule (5,000 Units total) by mouth daily. 10/13/19   Renato Shin, MD  docusate sodium (COLACE) 100 MG capsule Take 100 mg by mouth at bedtime.    [provider]  DULoxetine (CYMBALTA) 60 MG capsule Take 60  mg by mouth daily.    [provider]  escitalopram (LEXAPRO) 20 MG tablet Take 1 tablet (20 mg total) by mouth at bedtime. 01/26/19   Renato Shin, MD  fluticasone (FLONASE) 50 MCG/ACT nasal spray Place 2 sprays into both nostrils daily. 12/13/19   Julian Hy, DO  Magnesium 250 MG TABS Take 1 tablet by mouth daily.    [provider]  methylPREDNISolone (MEDROL) 4 MG tablet Take 4 mg by mouth in the morning and at bedtime.    [provider]  montelukast (SINGULAIR) 10 MG tablet Take 1 tablet (10 mg total) by mouth at bedtime. 12/13/19   Julian Hy, DO  NON FORMULARY at bedtime. CPAP at night     [provider]  omeprazole (PRILOSEC) 20 MG capsule Take 20 mg by mouth 2 (two) times daily before a meal.    [provider]  simethicone (PHAZYME) 125 MG chewable tablet Chew 125-250 mg by mouth every 6 (six) hours as needed for flatulence.     [provider]  SYNTHROID 150 MCG tablet Take 1 tablet (150 mcg total) by mouth daily before breakfast. 10/04/19   Renato Shin, MD  Tiotropium Bromide Monohydrate (SPIRIVA RESPIMAT) 1.25 MCG/ACT AERS Inhale 2 puffs into the lungs daily. 01/05/20   Julian Hy, DO  Wheat Dextrin (BENEFIBER DRINK MIX PO) Take 1 Dose by mouth 3 (three) times daily.     [provider]    Physical Exam:    Physical Exam: Vitals:   01/19/20 2030 01/19/20 2045 01/19/20 2100 01/19/20 2115  BP: 134/80 (!) 141/86 133/80 137/82  Pulse: 81 78 77 74  Resp: 16 15 12 14   Temp:      TempSrc:      SpO2: 99% 98% 100% 100%  Weight:        Constitutional: NAD, calm, comfortable Vitals:   01/19/20 2030 01/19/20 2045 01/19/20 2100 01/19/20 2115  BP: 134/80 (!) 141/86 133/80 137/82  Pulse: 81 78 77 74  Resp: 16 15 12 14   Temp:      TempSrc:      SpO2: 99% 98% 100% 100%  Weight:       Eyes: PERRL, lids and conjunctivae normal ENMT: Mucous membranes are moist. Posterior pharynx clear of any exudate or  lesions.Normal dentition.  Neck: normal, supple, no masses, no thyromegaly Respiratory: clear to auscultation bilaterally, no wheezing, no crackles. Normal respiratory effort. No accessory muscle use.  Cardiovascular: Regular rate and rhythm, no murmurs / rubs / gallops. No extremity edema. 2+ pedal pulses. No carotid bruits.  Abdomen: no tenderness, no masses  palpated. No hepatosplenomegaly. Bowel sounds positive.  Musculoskeletal: no clubbing / cyanosis. No joint deformity upper and lower extremities. Good ROM, no contractures. Normal muscle tone.  Skin: no rashes, lesions, ulcers. No induration Neurologic: CN 2-12 grossly intact. Sensation intact, DTR normal. Strength 5/5 in all 4.  Psychiatric: Normal judgment and insight. Alert and oriented x 3. Normal mood.    Decubitus Ulcers: Not present on admission Catheters and tubes: None  Data Review:    Labs on Admission: I have personally reviewed following labs and imaging studies  CBC: Recent Labs  Lab 01/19/20 1957 01/19/20 2017  WBC 10.9*  --   NEUTROABS 8.0*  --   HGB 13.2 13.3  HCT 40.6 39.0  MCV 90.0  --   PLT 268  --    Basic Metabolic Panel: Recent Labs  Lab 01/19/20 1957 01/19/20 2017  NA 138 138  K 3.9 3.9  CL 102 101  CO2 25  --   GLUCOSE 106* 104*  BUN 13 12  CREATININE 0.76 0.70  CALCIUM 9.4  --    GFR: Estimated Creatinine Clearance: 118.7 mL/min (by C-G formula based on SCr of 0.7 mg/dL). Liver Function Tests: Recent Labs  Lab 01/19/20 1957  AST 17  ALT 24  ALKPHOS 50  BILITOT 0.4  PROT 7.6  ALBUMIN 3.7   No results for input(s): LIPASE, AMYLASE in the last 168 hours. No results for input(s): AMMONIA in the last 168 hours. Coagulation Profile: Recent Labs  Lab 01/19/20 1957  INR 1.0   Cardiac Enzymes: No results for input(s): CKTOTAL, CKMB, CKMBINDEX, TROPONINI in the last 168 hours. BNP (last 3 results) No results for input(s): PROBNP in the last 8760 hours. HbA1C: No results for  input(s): HGBA1C in the last 72 hours. CBG: Recent Labs  Lab 01/19/20 1955  GLUCAP 101*   Lipid Profile: No results for input(s): CHOL, HDL, LDLCALC, TRIG, CHOLHDL, LDLDIRECT in the last 72 hours. Thyroid Function Tests: No results for input(s): TSH, T4TOTAL, FREET4, T3FREE, THYROIDAB in the last 72 hours. Anemia Panel: No results for input(s): VITAMINB12, FOLATE, FERRITIN, TIBC, IRON, RETICCTPCT in the last 72 hours. Urine analysis:    Component Value Date/Time   COLORURINE YELLOW 01/19/2020 2030   APPEARANCEUR CLOUDY (A) 01/19/2020 2030   LABSPEC 1.013 01/19/2020 2030   PHURINE 9.0 (H) 01/19/2020 2030   GLUCOSEU NEGATIVE 01/19/2020 2030   [place] NEGATIVE 01/19/2020 2030   [place] NEGATIVE 01/19/2020 2030   [place] 01/19/2020 2030   PROTEINUR NEGATIVE 01/19/2020 2030   UROBILINOGEN 0.2 08/10/2014 1117   NITRITE NEGATIVE 01/19/2020 2030   LEUKOCYTESUR NEGATIVE 01/19/2020 2030     Imaging Results:      Radiological Exams on Admission: CT HEAD CODE STROKE WO CONTRAST  Result Date: 01/19/2020 CLINICAL DATA:  Code stroke. Right-sided weakness. Headache and diplopia. EXAM: CT HEAD WITHOUT CONTRAST TECHNIQUE: Contiguous axial images were obtained from the base of the skull through the vertex without intravenous contrast. COMPARISON:  None. FINDINGS: Brain: There is no mass, hemorrhage or extra-axial collection. The size and configuration of the ventricles and extra-axial CSF spaces are normal. The brain parenchyma is normal, without evidence of acute or chronic infarction. Vascular: No abnormal hyperdensity of the major intracranial arteries or dural venous sinuses. No intracranial atherosclerosis. Skull: The visualized skull base, calvarium and extracranial soft tissues are normal. Sinuses/Orbits: No fluid levels or advanced mucosal thickening of the visualized paranasal sinuses. No mastoid or middle ear effusion. The orbits are normal. ASPECTS [facility] Stroke Program  Early CT Score) - Ganglionic level infarction (caudate, lentiform nuclei, internal capsule, insula, M1-M3 cortex): 7 - Supraganglionic infarction (M4-M6 cortex): 3 Total score (0-10 with 10 being normal): 10 IMPRESSION: 1. Normal head CT. 2. ASPECTS is 10. * These results were called by telephone at the time of interpretation on 01/19/2020 at 8:00 pm to provider Fredia Sorrow , who verbally acknowledged these results. Electronically Signed   By: Ulyses Jarred M.D.   On: 01/19/2020 20:01      [facility] Ginette Otto MD [facility]  If 7PM-7AM, please contact night-coverage   01/19/2020, 9:33 PM

## 2020-01-19 NOTE — ED Notes (Signed)
Pt remains in CT at this time.  

## 2020-01-19 NOTE — ED Triage Notes (Signed)
Pt brought to ED via [place] for right sided numbness and tingling and her balance being off. [id number]

## 2020-01-19 NOTE — ED Triage Notes (Signed)
Pt from home via [facility] with numbness and tingling to her whole right side that started at 1545 today.   Pt states she has had some tingling to her face for "quite some time"  But worse today.

## 2020-01-19 NOTE — Consult Note (Addendum)
[facility] Tele[facility]urology Consult Services   Date of Service:   01/19/2020 19:51:13  Impression:     .  R20.2 - Paresthesia of skin  Comments/Sign-Out: Patient exam and history is not consistent with a stroke. Her NIH is 0. I am concerned about possible demyelinating disease. Plain CT of the head is negative. I would suggest getting an MRI of the head and cervical spine with and without contrast. Depending on the results of that we will plan further care. She will need neurology and rheumatology follow-ups also.  Metrics: Last Known Well: 01/19/2020 15:00:00 [facility] Notification Time: 01/19/2020 19:51:13 Arrival Time: 01/19/2020 19:41:00 Stamp Time: 01/19/2020 19:51:13 Time First Login Attempt: 01/19/2020 19:54:33 Symptoms: Numbness NIHSS Start Assessment Time: 01/19/2020 20:00:59 Patient is not a candidate for Alteplase/Activase. Alteplase Medical Decision: 01/19/2020 20:03:57 Patient was not deemed candidate for Alteplase/Activase thrombolytics because of Last Well Known Above 4.5 Hours.  CT head showed no acute hemorrhage or acute core infarct.  Clinical Presentation is not Suggestive of Large Vessel Occlusive Disease  ED Physician notified of diagnostic impression and management plan on 01/19/2020 20:07:38  Our recommendations are outlined below.  Recommendations:     .  Activate Stroke Protocol Admission/Order Set     .  Stroke/Telemetry Floor     .  Neuro Checks     .  Bedside Swallow Eval     .  DVT Prophylaxis     .  IV Fluids, Normal Saline     .  Head of Bed 30 Degrees     .  Euglycemia and Avoid Hyperthermia (PRN Acetaminophen)  Routine Consultation with [facility] for Follow up Care  Sign Out:     .  Discussed with Emergency Department Provider    ------------------------------------------------------------------------------  History of Present Illness: Patient is a 44 year old Female.  Patient was brought by EMS for  symptoms of Numbness  44 year old female with past medical history of Graves' disease came to the hospital with multiple complaints. She reports she is having some numbness on the right side. Around 3:00 she started noticing some dizziness and lightheadedness and then started noticing some numbness. She reports she has been having this numbness intermittently for some time and is being followed by rheumatologist and is scheduled to see a neurologist. She also reports some chest pressure and chest tightness and reports that she was told that this could be due to multiple sclerosis. She have not had any MRIs or any further work-up for MS done at this point.   Past Medical History:     . Hypertension     . Hyperlipidemia     . There is NO history of Diabetes Mellitus     . There is NO history of Atrial Fibrillation     . There is NO history of Coronary Artery Disease     . There is NO history of Stroke  Anticoagulant use:  No  Antiplatelet use: No    Examination: BP(142/79), Pulse(93), Blood Glucose(101) 1A: Level of Consciousness - Alert; keenly responsive + 0 1B: Ask Month and Age - Both Questions Right + 0 1C: Blink Eyes & Squeeze Hands - Performs Both Tasks + 0 2: Test Horizontal Extraocular Movements - Normal + 0 3: Test Visual Fields - No Visual Loss + 0 4: Test Facial Palsy (Use Grimace if Obtunded) - Normal symmetry + 0 5A: Test Left Arm Motor Drift - No Drift for 10 Seconds + 0 5B: Test Right Arm Motor Drift -  No Drift for 10 Seconds + 0 6A: Test Left Leg Motor Drift - No Drift for 5 Seconds + 0 6B: Test Right Leg Motor Drift - No Drift for 5 Seconds + 0 7: Test Limb Ataxia (FNF/Heel-Shin) - No Ataxia + 0 8: Test Sensation - Normal; No sensory loss + 0 9: Test Language/Aphasia - Normal; No aphasia + 0 10: Test Dysarthria - Normal + 0 11: Test Extinction/Inattention - No abnormality + 0  NIHSS Score: 0  Pre-Morbid Modified Ranking Scale: 0 Points = No symptoms at  all   Patient/Family was informed the Neurology Consult would occur via TeleHealth consult by way of interactive audio and video telecommunications and consented to receiving care in this manner.   Due to the immediate potential for life-threatening deterioration due to underlying acute neurologic illness, I spent 30 minutes providing critical care. This time includes time for face to face visit via telemedicine, review of medical records, imaging studies and discussion of findings with providers, the patient and/or family.   Dr Faustino Congress   [facility] [phone]  Case GT:[id number]

## 2020-01-20 ENCOUNTER — Observation Stay (HOSPITAL_COMMUNITY): Payer: BC Managed Care – PPO

## 2020-01-20 ENCOUNTER — Observation Stay (HOSPITAL_COMMUNITY)
Admit: 2020-01-20 | Discharge: 2020-01-20 | Disposition: A | Discharge status: Home / Self Care | Payer: BC Managed Care – PPO | Attending: Internal Medicine | Admitting: Internal Medicine

## 2020-01-20 DIAGNOSIS — R079 Chest pain, unspecified: Secondary | ICD-10-CM | POA: Diagnosis not present

## 2020-01-20 DIAGNOSIS — R0789 Other chest pain: Secondary | ICD-10-CM | POA: Diagnosis not present

## 2020-01-20 DIAGNOSIS — R209 Unspecified disturbances of skin sensation: Secondary | ICD-10-CM | POA: Diagnosis not present

## 2020-01-20 DIAGNOSIS — E038 Other specified hypothyroidism: Secondary | ICD-10-CM

## 2020-01-20 DIAGNOSIS — M48061 Spinal stenosis, lumbar region without neurogenic claudication: Secondary | ICD-10-CM | POA: Diagnosis not present

## 2020-01-20 DIAGNOSIS — R200 Anesthesia of skin: Secondary | ICD-10-CM

## 2020-01-20 DIAGNOSIS — M4802 Spinal stenosis, cervical region: Secondary | ICD-10-CM | POA: Diagnosis not present

## 2020-01-20 DIAGNOSIS — M47816 Spondylosis without myelopathy or radiculopathy, lumbar region: Secondary | ICD-10-CM | POA: Diagnosis not present

## 2020-01-20 DIAGNOSIS — E66813 Obesity, class 3: Secondary | ICD-10-CM

## 2020-01-20 DIAGNOSIS — M47814 Spondylosis without myelopathy or radiculopathy, thoracic region: Secondary | ICD-10-CM | POA: Diagnosis not present

## 2020-01-20 LAB — HEMOGLOBIN A1C
Hgb A1c MFr Bld: 6.2 % — ABNORMAL HIGH (ref 4.8–5.6)
Mean Plasma Glucose: 131.24 mg/dL

## 2020-01-20 LAB — LIPID PANEL
Cholesterol: 222 mg/dL — ABNORMAL HIGH (ref 0–200)
HDL: 55 mg/dL (ref >40)
LDL Cholesterol: 106 mg/dL — ABNORMAL HIGH (ref 0–99)
Total CHOL/HDL Ratio: 4 RATIO
Triglycerides: 306 mg/dL — ABNORMAL HIGH (ref <150)
VLDL: 61 mg/dL — ABNORMAL HIGH (ref 0–40)

## 2020-01-20 LAB — TSH: TSH: 3.353 u[IU]/mL (ref 0.350–4.500)

## 2020-01-20 LAB — IRON AND TIBC
Iron: 72 ug/dL (ref 28–170)
Saturation Ratios: 20 % (ref 10.4–31.8)
TIBC: 358 ug/dL (ref 250–450)
UIBC: 286 ug/dL

## 2020-01-20 LAB — SEDIMENTATION RATE: Sed Rate: 36 mm/hr — ABNORMAL HIGH (ref 0–22)

## 2020-01-20 LAB — FOLATE: Folate: 10.4 ng/mL (ref >5.9)

## 2020-01-20 LAB — RPR: RPR Ser Ql: NONREACTIVE

## 2020-01-20 LAB — C-REACTIVE PROTEIN: CRP: 1.7 mg/dL — ABNORMAL HIGH (ref <1.0)

## 2020-01-20 LAB — FERRITIN: Ferritin: 40 ng/mL (ref 11–307)

## 2020-01-20 LAB — TROPONIN I (HIGH SENSITIVITY)
Troponin I (High Sensitivity): 4 ng/L (ref <18)
Troponin I (High Sensitivity): 3 ng/L

## 2020-01-20 LAB — CK: Total CK: 46 U/L (ref 38–234)

## 2020-01-20 LAB — HIV ANTIBODY (ROUTINE TESTING W REFLEX): HIV Screen 4th Generation wRfx: NONREACTIVE

## 2020-01-20 LAB — MAGNESIUM: Magnesium: 2 mg/dL (ref 1.7–2.4)

## 2020-01-20 IMAGING — MR MR CERVICAL SPINE WO/W CM
4 of 8 series · 18 of 48 positions shown · IV contrast (agent unspecified)
Comparison: None.

CONTRAST:  10 mL Gadavist

CLINICAL DATA: Right facial numbness for 3 months with occasional
right eye twitching. Numbness and tingling in the right greater than
left hands and feet. Leg weakness.

EXAM:
MRI CERVICAL, THORACIC AND LUMBAR SPINE WITHOUT AND WITH CONTRAST
TECHNIQUE: Multiplanar and multiecho pulse sequences of the cervical spine, to
include the craniocervical junction and cervicothoracic junction,
and thoracic and lumbar spine, were obtained without and with
intravenous contrast.

[Series 6: T2 · sagittal · 3.0mm · 0.78mm/px · 5 of 15 slices shown (1 of 2)]
[im 1/15]
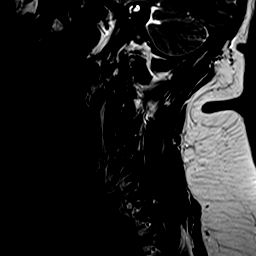
[im 4/15]
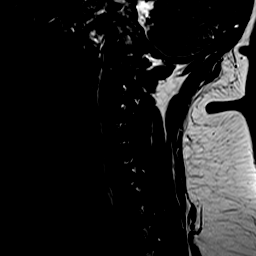
[im 8/15]
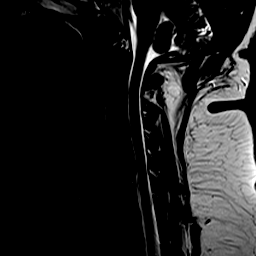
[im 11/15]
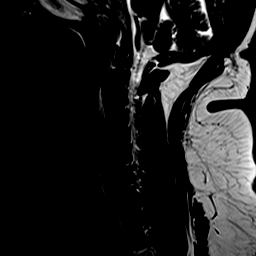
[im 15/15]
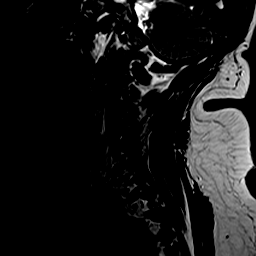

[Series 7: T1 · sagittal · 5.0mm · 0.40mm/px · 3 of 9 slices shown]
[im 1/9]
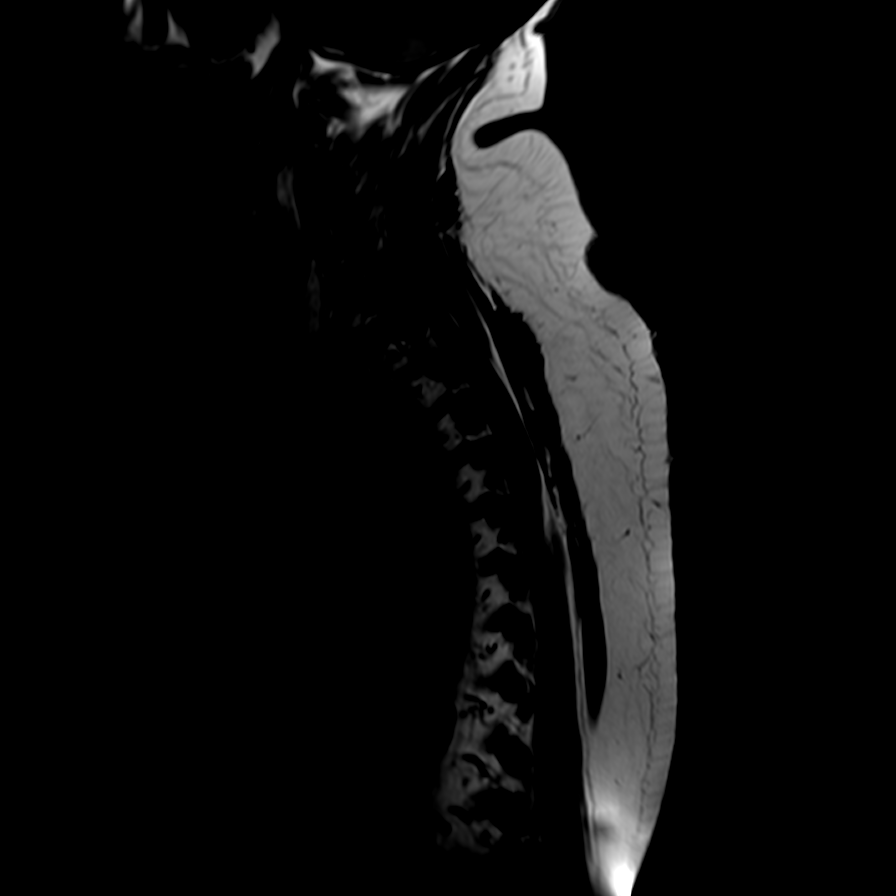
[im 6/9]
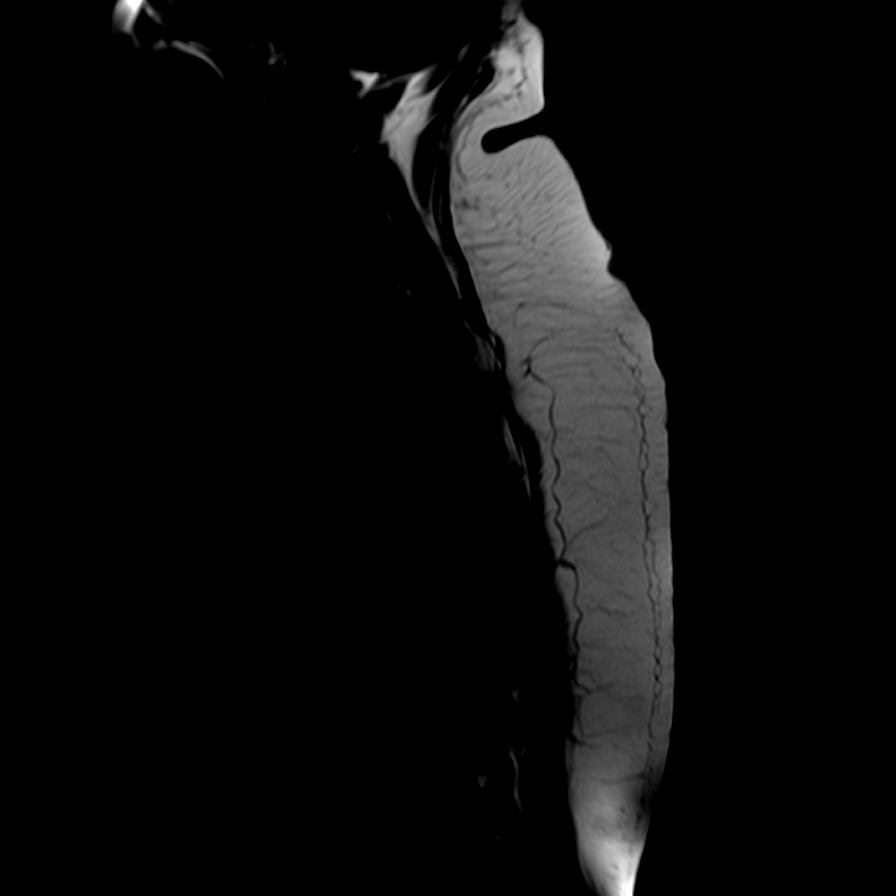
[im 9/9]
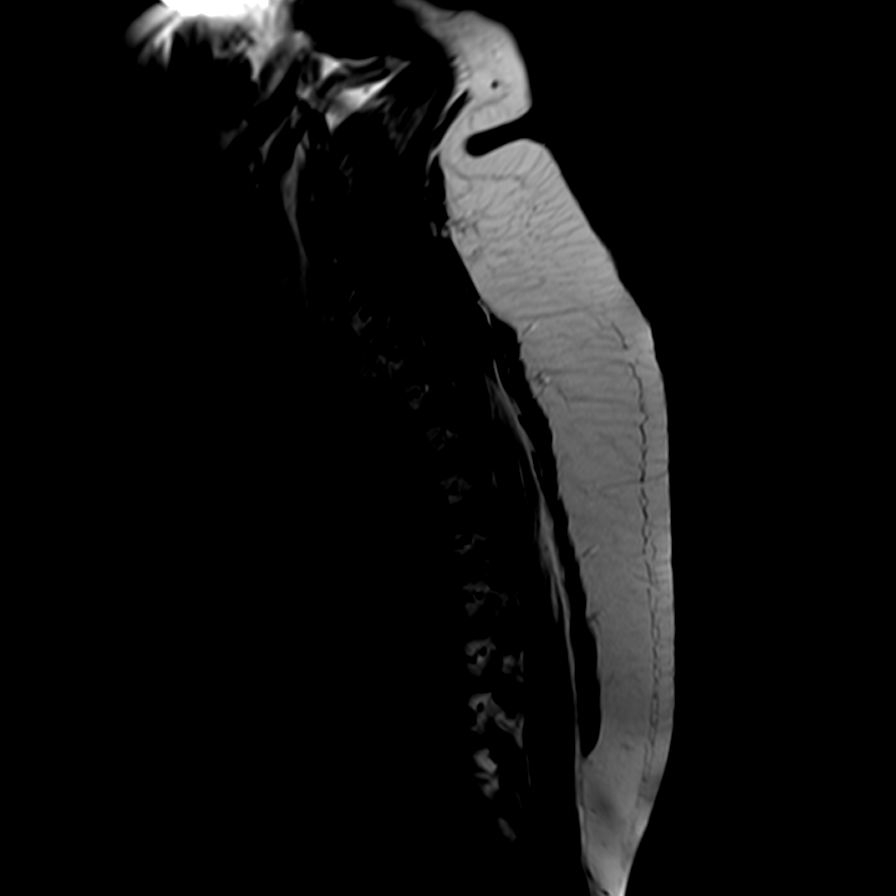

[Series 10: T2 · axial · 3.0mm · 0.22mm/px · z∈[-194,-107]mm · 7 of 32 slices shown (2 of 2)]
[im 1/32]
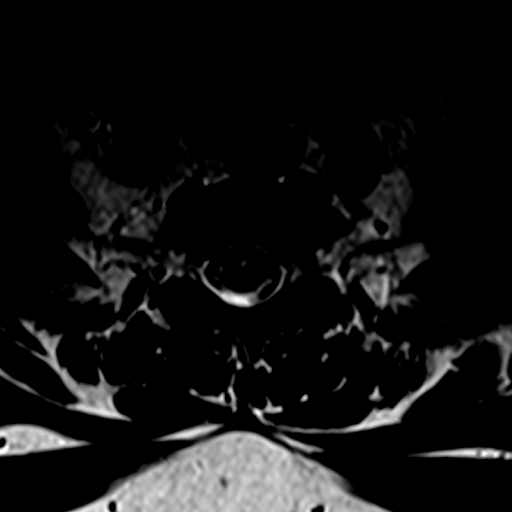
[im 4/32]
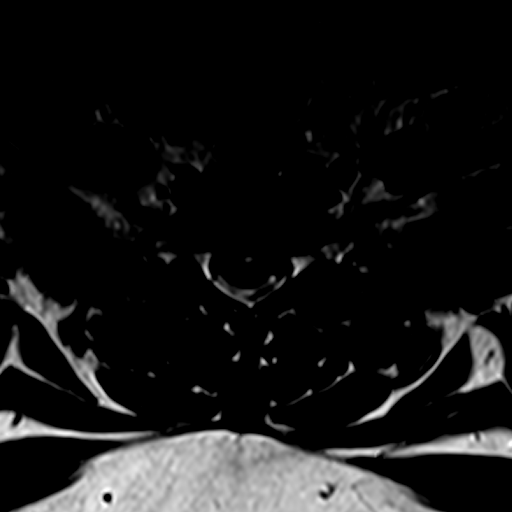
[im 8/32]
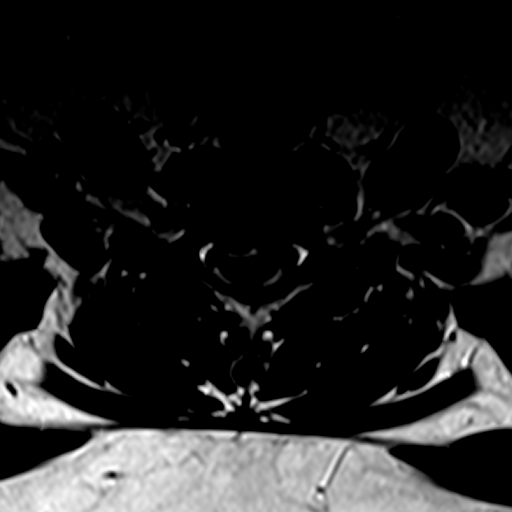
[im 12/32]
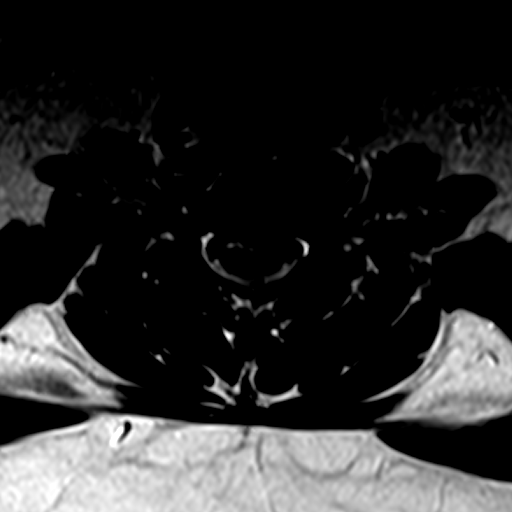
[im 16/32]
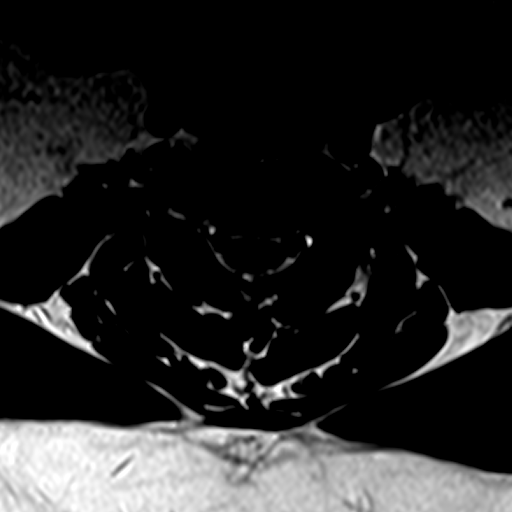
[im 20/32]
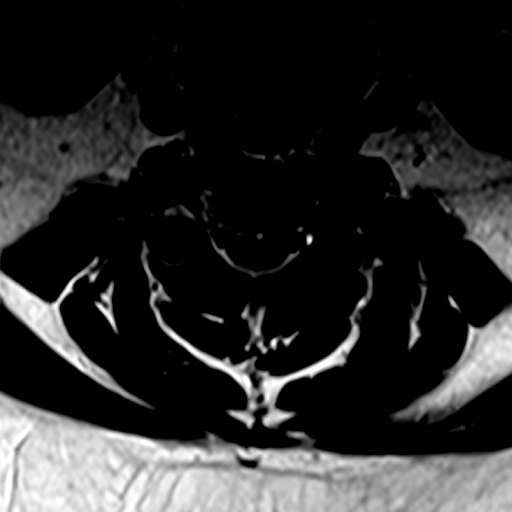
[im 28/32]
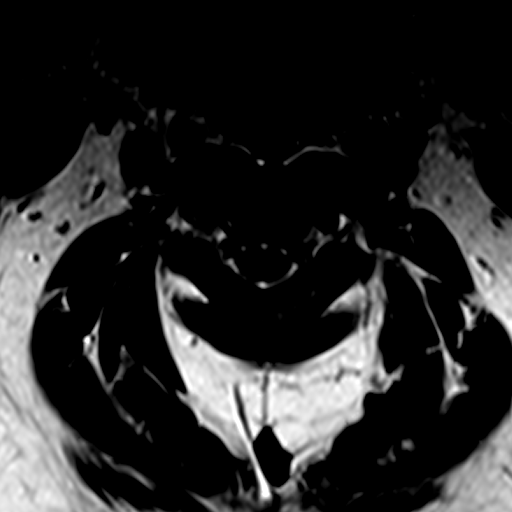

[Series 14: T1 fat-sat post-contrast · sagittal · 3.0mm · 0.73mm/px · 3 of 15 slices shown]
[im 1/15]
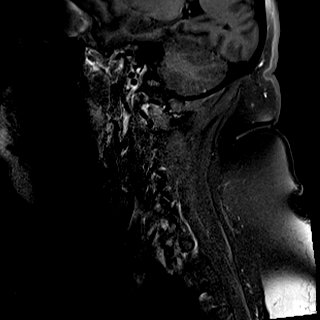
[im 10/15]
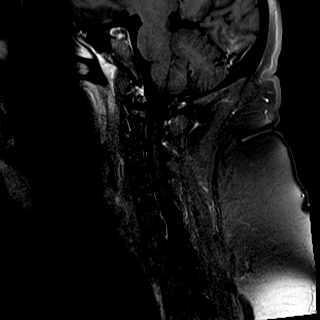
[im 15/15]
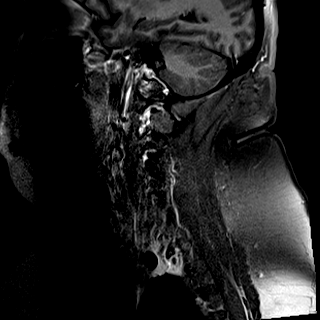

[18 of 48 positions shown; findings below may reference images not displayed]

FINDINGS: MRI CERVICAL SPINE FINDINGS

Alignment: Cervical spine straightening. No listhesis.

Vertebrae: No fracture, suspicious osseous lesion, or significant
marrow edema.

Cord: Normal signal. No abnormal intradural enhancement.

Posterior Fossa, vertebral arteries, paraspinal tissues:
Unremarkable.

Disc levels:

C2-3 and C3-4: Negative.

C4-5: A small central/right central disc protrusion results in a
mild focal indentation of the ventral spinal cord and borderline
spinal stenosis. Patent neural foramina.

C5-6: A small central disc extrusion with slight caudal migration
mildly flattens the ventral spinal cord with borderline to mild
spinal stenosis. Patent neural foramina.

C6-7: A moderate-sized right paracentral disc extrusion results in
moderate right-sided spinal stenosis with mild cord flattening.
Patent neural foramina.

C7-T1: Negative.

MRI THORACIC SPINE FINDINGS

Alignment:  Normal.

Vertebrae: No fracture, suspicious osseous lesion, or significant
marrow edema.

Cord: Normal signal and morphology. No abnormal intradural
enhancement.

Paraspinal and other soft tissues: 1 cm cystic focus to the right of
the T4-5 disc space, benign in appearance.

Disc levels:

Mild thoracic spondylosis with mild anterior vertebral spurring
throughout the mid to lower thoracic spine. No disc herniation,
spinal stenosis, or neural foraminal stenosis.

MRI LUMBAR SPINE FINDINGS

Segmentation:  Standard.

Alignment: Normal.

Vertebrae: No fracture, suspicious osseous lesion, or significant
marrow edema.

Conus medullaris and cauda equina: Conus extends to the T12-L1
level. Conus and cauda equina appear normal.

Paraspinal and other soft tissues: Partially visualized right renal
cyst as seen on a [DATE] abdominal CT.

Disc levels:

L1-2: Negative.

L2-3: Small left foraminal and extraforaminal disc protrusion
without stenosis.

L3-4: Negative.

L4-5: Mild disc desiccation. Mild disc bulging results in mild right
greater than left neural foraminal narrowing without spinal
stenosis.

L5-S1: Negative.
IMPRESSION: 1. Multiple cervical disc herniations, most notable at C6-7 where
there is moderate right-sided spinal stenosis and mild cord
flattening.
2. No spinal cord signal abnormality.
3. Mild thoracic and lumbar spondylosis without evidence of neural
impingement.

## 2020-01-20 IMAGING — DX DG CHEST 2V
2 series · 2 of 2 positions shown · non-contrast
Comparison: [DATE]

CLINICAL DATA: Chest pain.  Negative [Z2] test.

EXAM:
CHEST - 2 VIEW

[chest pa]
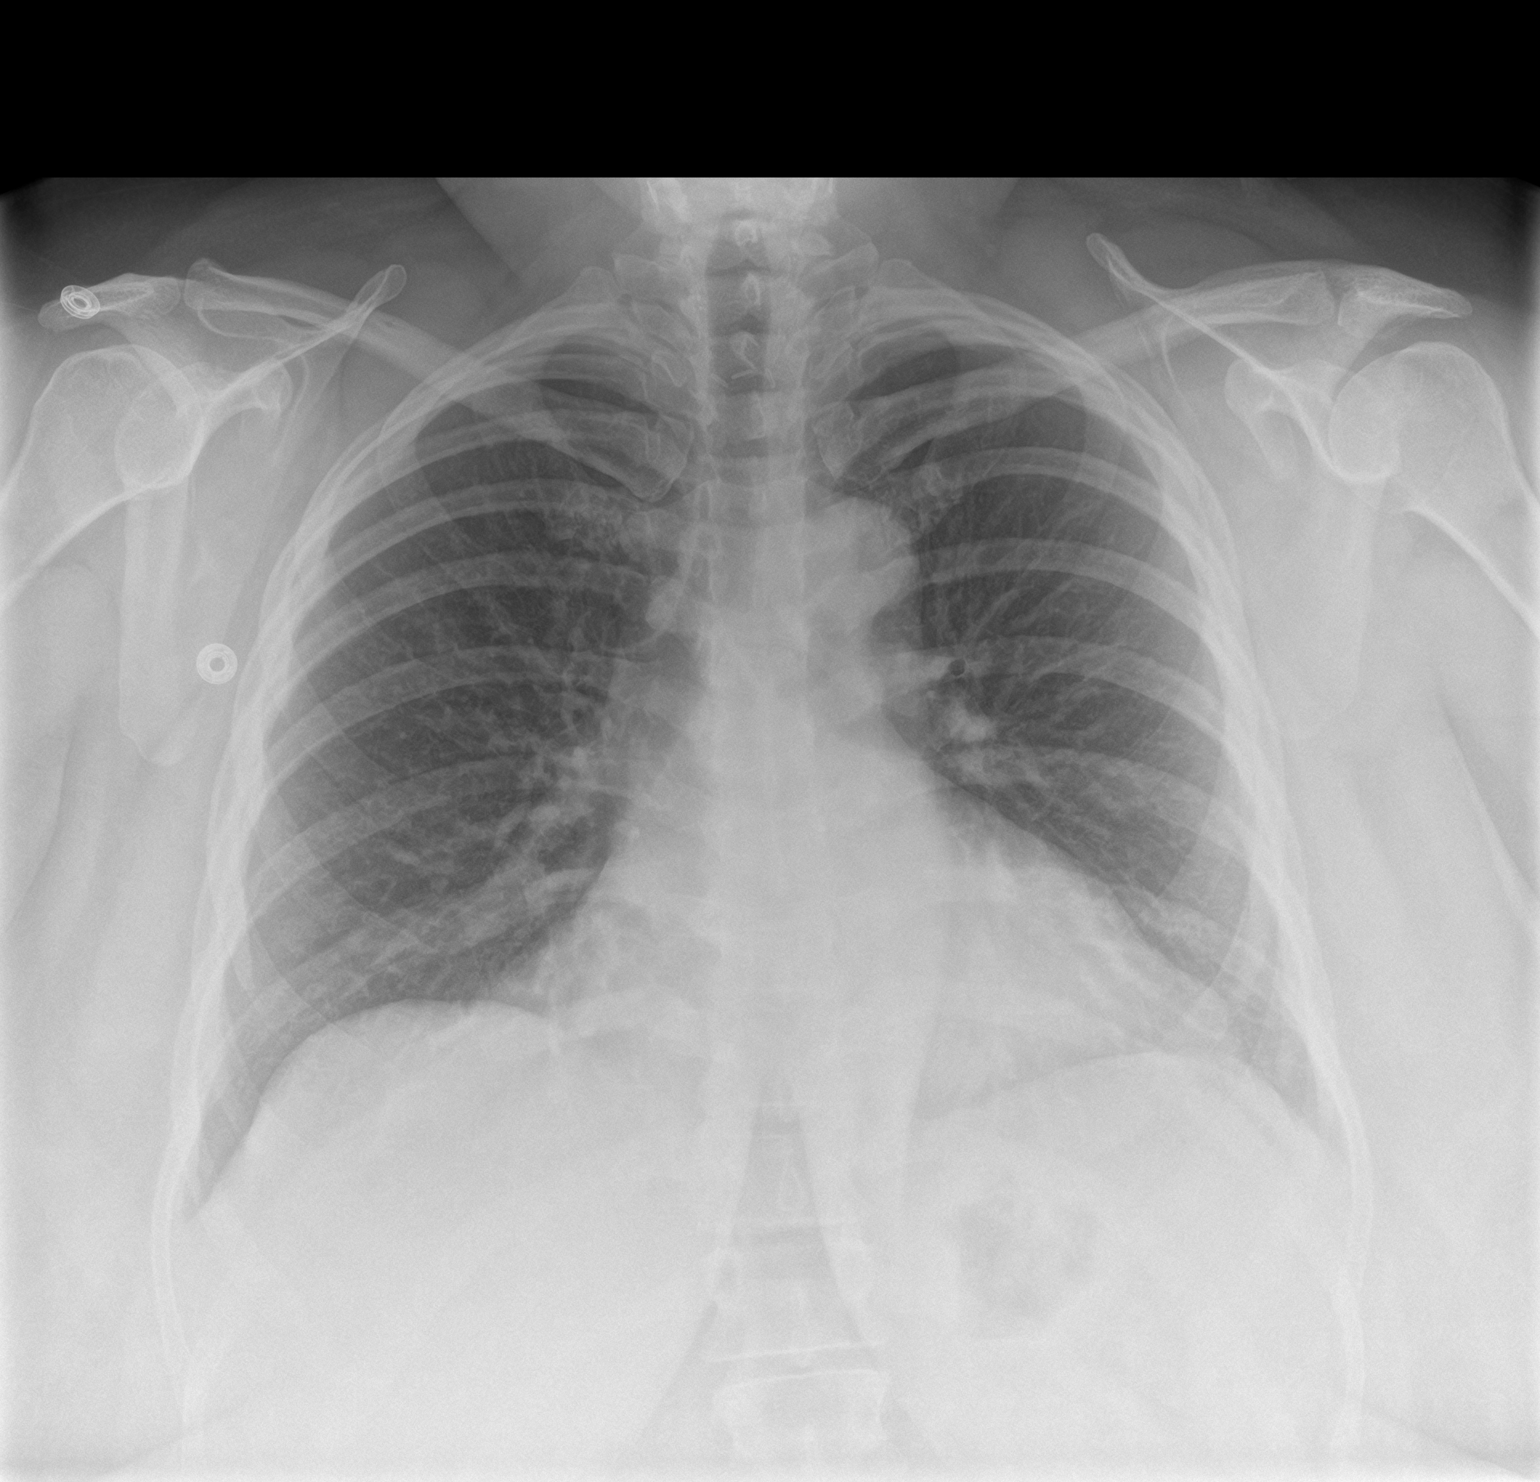

[chest lat]
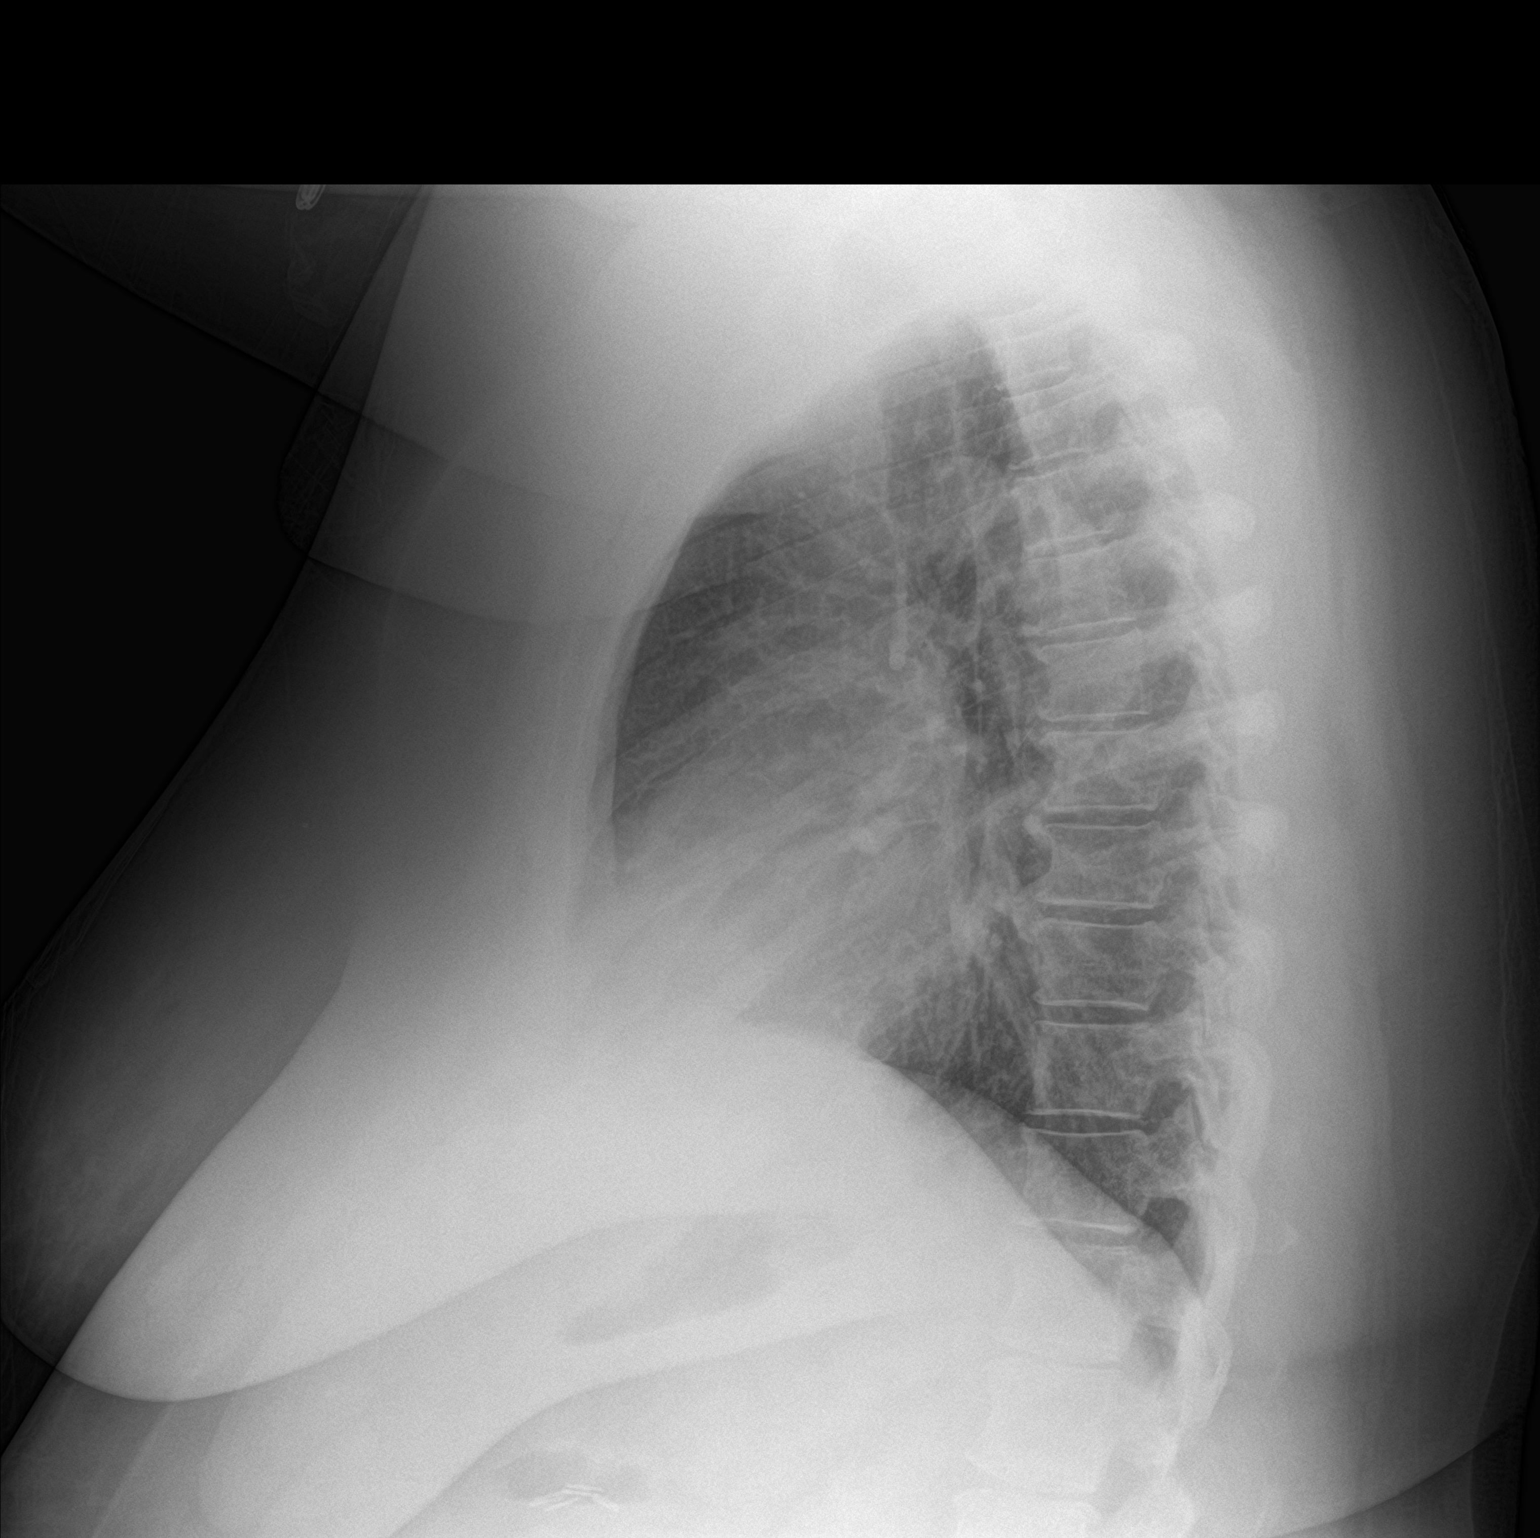

[2 of 2 positions shown; findings below may reference images not displayed]

FINDINGS: The heart size is borderline to mildly enlarged. The hila and
mediastinum are normal. No pneumothorax. No nodules or masses. No
focal infiltrates.
IMPRESSION: No active cardiopulmonary disease.

## 2020-01-20 IMAGING — MR MR HEAD WO/W CM
7 of 14 series · 28 of 48 positions shown · IV contrast (gadavist)
Comparison: Head CT [DATE]

CLINICAL DATA: Right facial numbness for 3 months with occasional
right eye twitching. Numbness and tingling in the right greater than
left hands and feet. Leg weakness.

EXAM:
MRI HEAD WITHOUT AND WITH CONTRAST
TECHNIQUE: Multiplanar, multiecho pulse sequences of the brain and surrounding
structures were obtained without and with intravenous contrast.
CONTRAST:  10mL GADAVIST GADOBUTROL 1 MMOL/ML IV SOLN

[Series 3: DWI · axial · 3.0mm · 0.77mm/px · z∈[-40,+117]mm · 8 of 110 slices shown (1 of 2)]
[im 1/110]
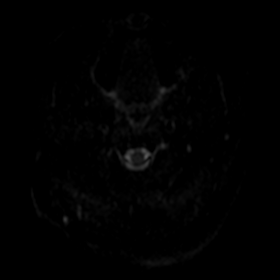
[im 16/110]
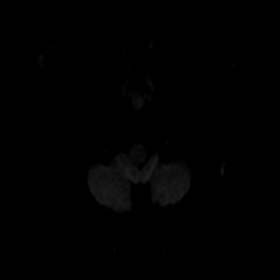
[im 32/110]
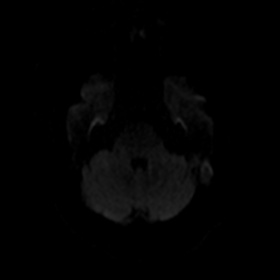
[im 47/110]
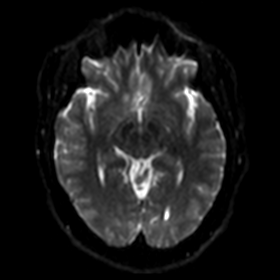
[im 63/110]
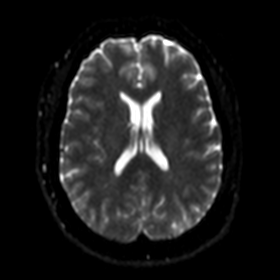
[im 78/110]
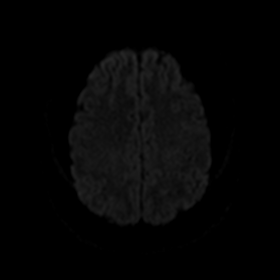
[im 94/110]
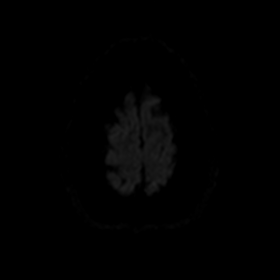
[im 110/110]
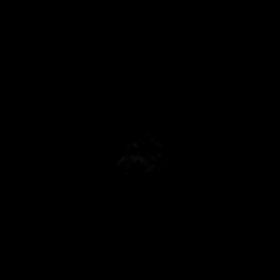

[Series 5: DWI · coronal · 5.0mm · 0.52mm/px · 5 of 70 slices shown (2 of 2)]
[im 1/70]
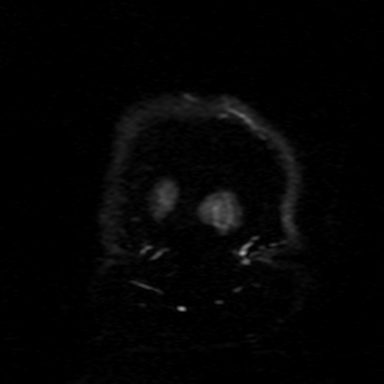
[im 18/70]
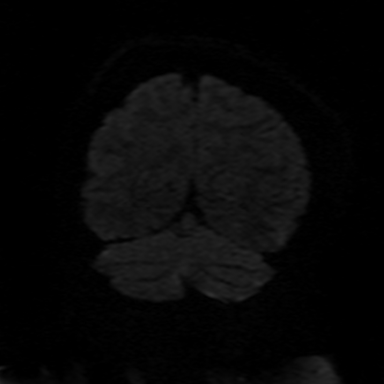
[im 35/70]
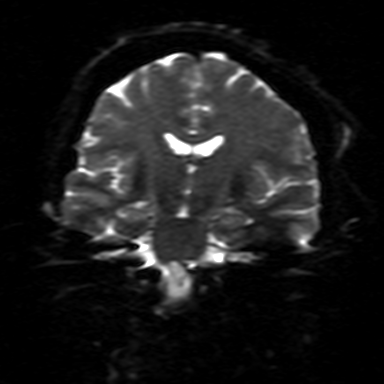
[im 52/70]
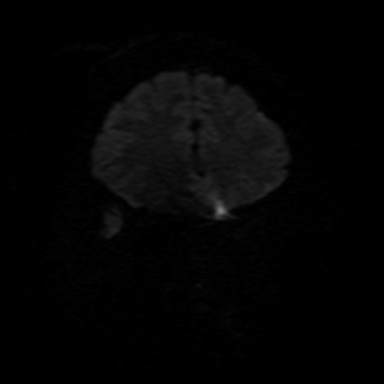
[im 70/70]
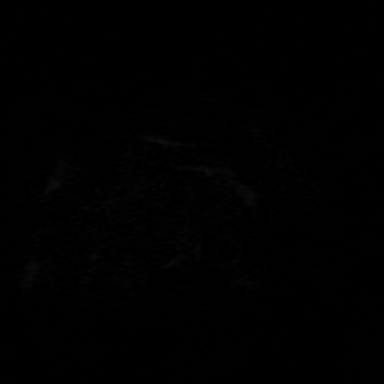

[Series 8: FLAIR · axial · 3.0mm · 0.33mm/px · z∈[-28,+106]mm · 4 of 47 slices shown]
[im 1/47]
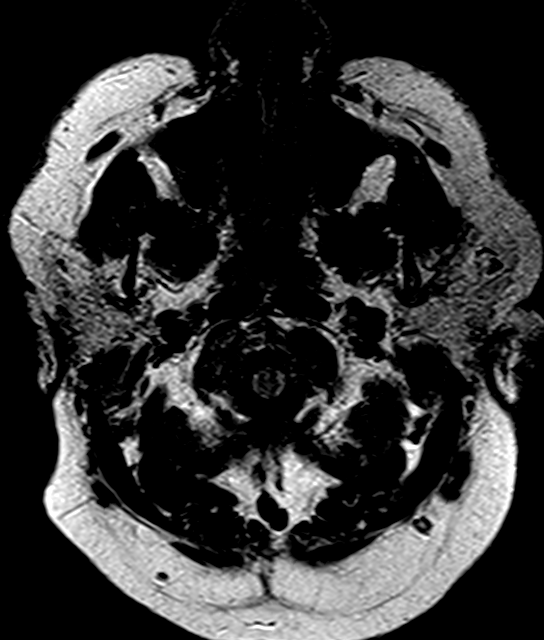
[im 16/47]
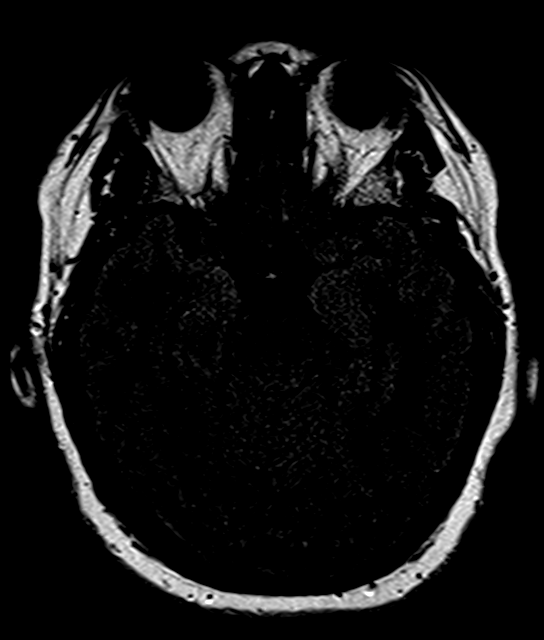
[im 31/47]
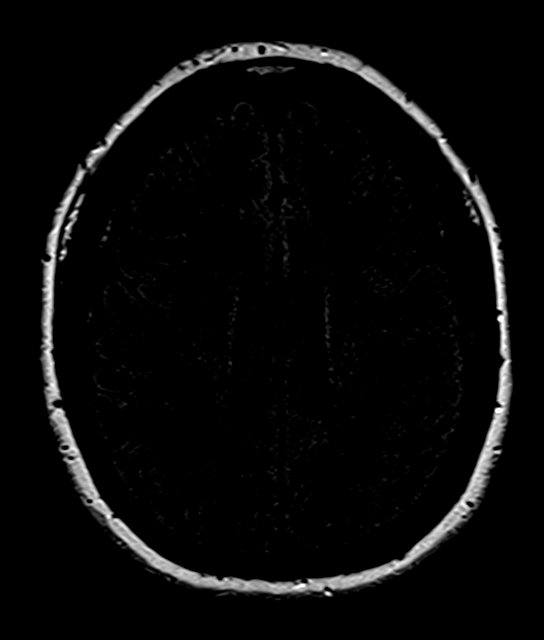
[im 47/47]
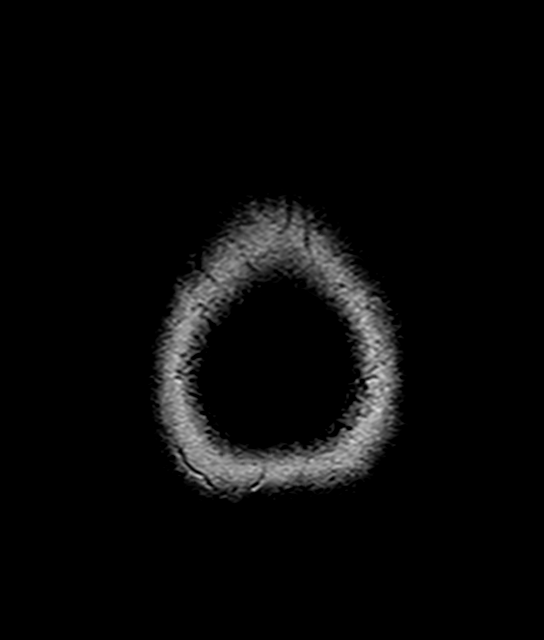

[Series 9: T2 · axial · 5.0mm · 0.56mm/px · z∈[-31,+107]mm · 2 of 23 slices shown]
[im 1/23]
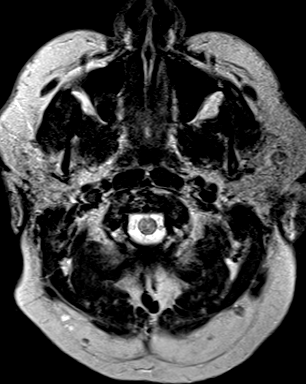
[im 23/23]
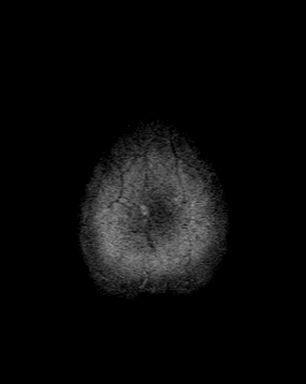

[Series 14: T1 post-contrast · axial · 2.0mm · 0.41mm/px · z∈[-48,+92]mm · 5 of 72 slices shown (1 of 3)]
[im 1/72]
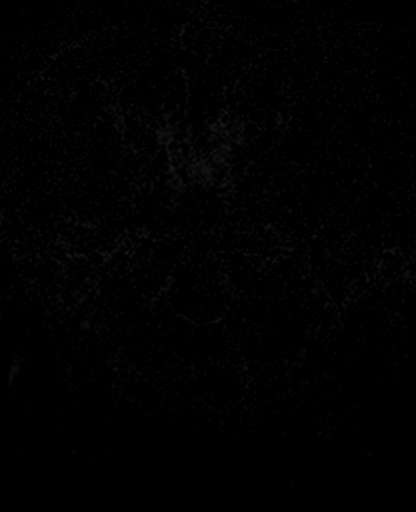
[im 18/72]
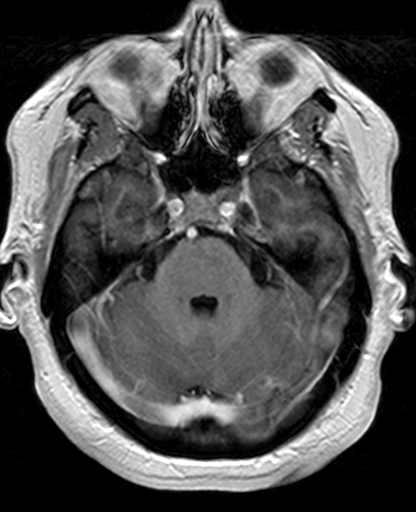
[im 36/72]
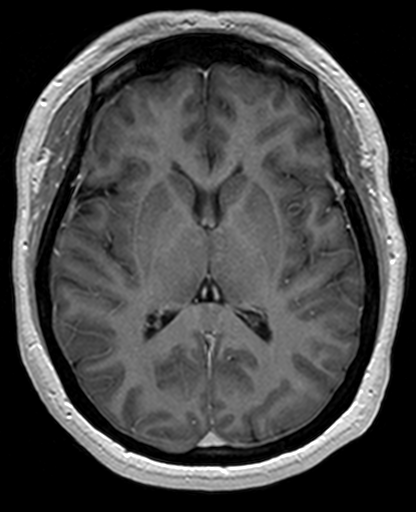
[im 54/72]
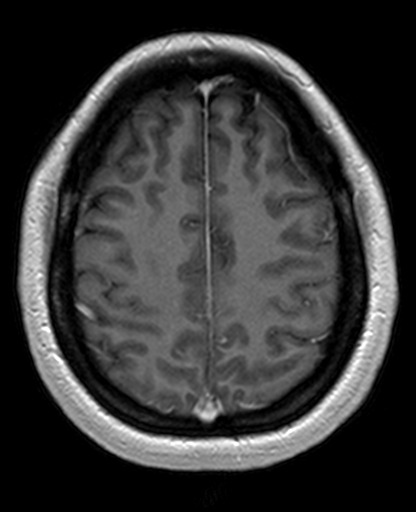
[im 72/72]
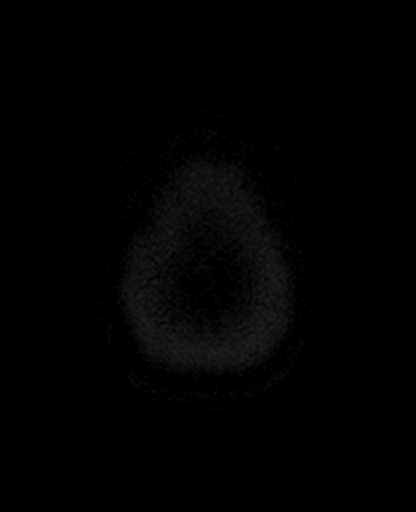

[Series 15: T1 post-contrast · coronal · 5.0mm · 0.38mm/px · 2 of 24 slices shown (2 of 3)]
[im 1/24]
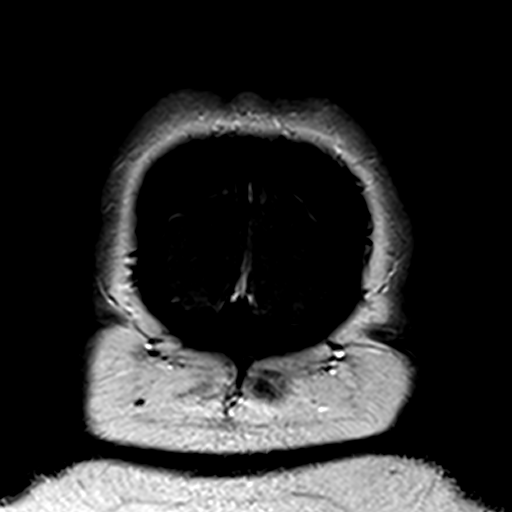
[im 24/24]
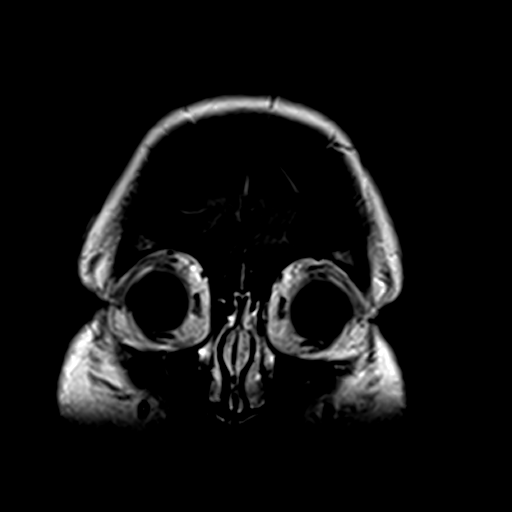

[Series 16: T1 post-contrast · sagittal · 5.0mm · 0.41mm/px · 2 of 21 slices shown (3 of 3)]
[im 1/21]
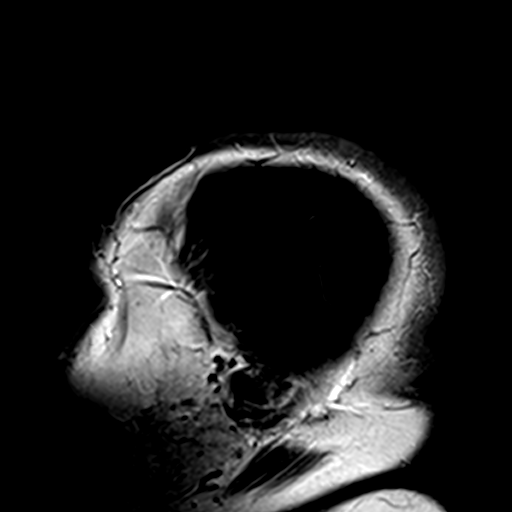
[im 21/21]
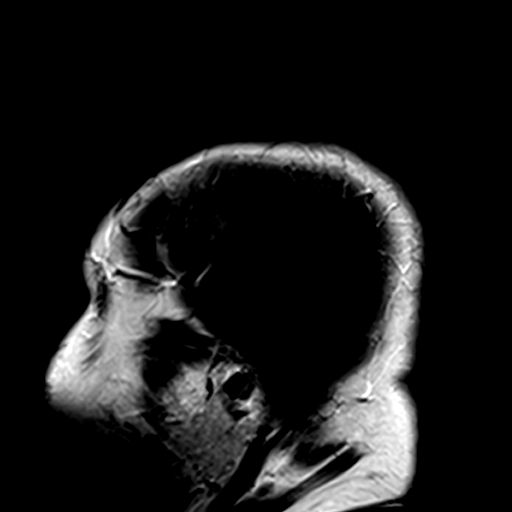

[28 of 48 positions shown; findings below may reference images not displayed]

FINDINGS: Brain: There is no evidence of acute infarct, intracranial
hemorrhage, mass, midline shift, or extra-axial fluid collection.
The ventricles and sulci are normal. The brain is normal in signal.
No abnormal enhancement is identified.

Vascular: Major intracranial vascular flow voids are preserved.

Skull and upper cervical spine: No suspicious marrow lesion.

Sinuses/Orbits: Unremarkable orbits. Paranasal sinuses and mastoid
air cells are clear.

Other: None.
IMPRESSION: Negative brain MRI.

## 2020-01-20 IMAGING — MR MR LUMBAR SPINE WO/W CM
4 of 7 series · 14 of 48 positions shown · IV contrast (agent unspecified)
Comparison: None.

CONTRAST:  10 mL Gadavist

CLINICAL DATA: Right facial numbness for 3 months with occasional
right eye twitching. Numbness and tingling in the right greater than
left hands and feet. Leg weakness.

EXAM:
MRI CERVICAL, THORACIC AND LUMBAR SPINE WITHOUT AND WITH CONTRAST
TECHNIQUE: Multiplanar and multiecho pulse sequences of the cervical spine, to
include the craniocervical junction and cervicothoracic junction,
and thoracic and lumbar spine, were obtained without and with
intravenous contrast.

[Series 3: T2 · sagittal · 4.0mm · 0.47mm/px · 3 of 15 slices shown (1 of 3)]
[im 1/15]
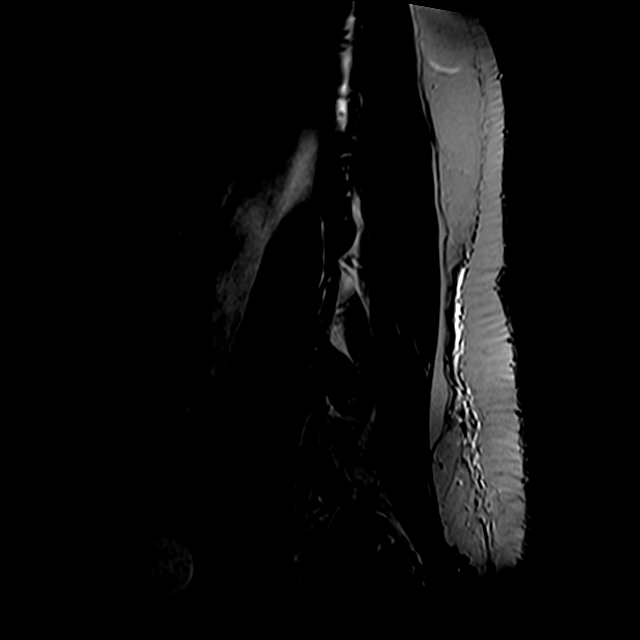
[im 8/15]
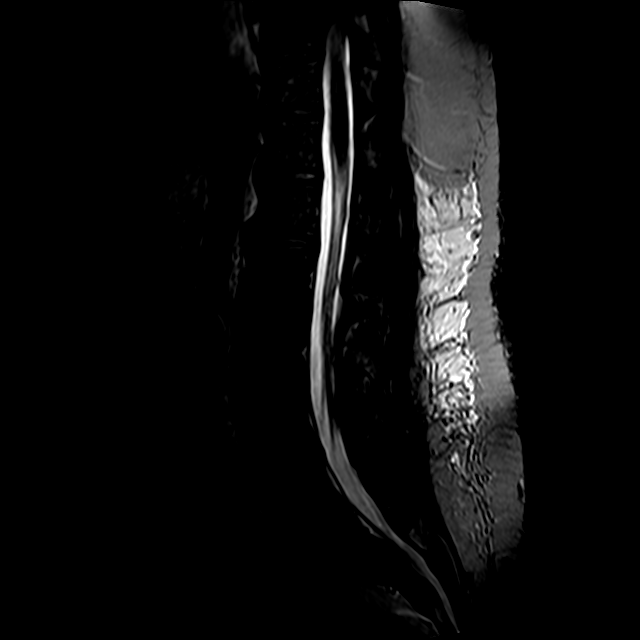
[im 15/15]
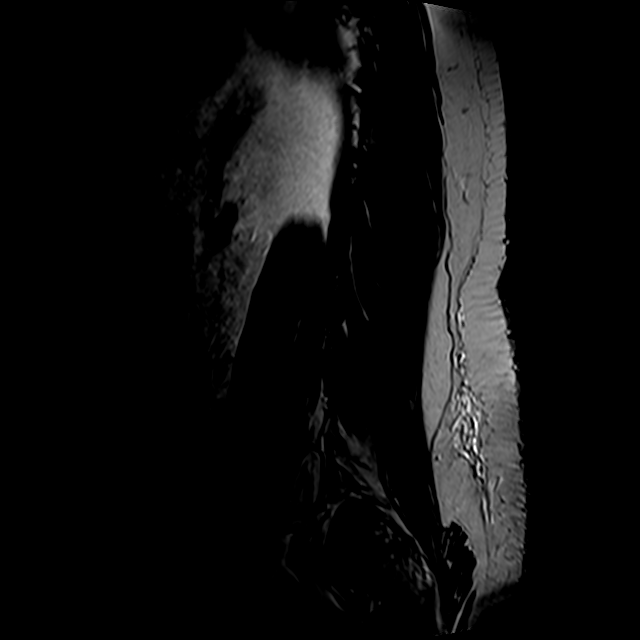

[Series 4: T1 · sagittal · 4.0mm · 0.47mm/px · 3 of 15 slices shown]
[im 1/15]
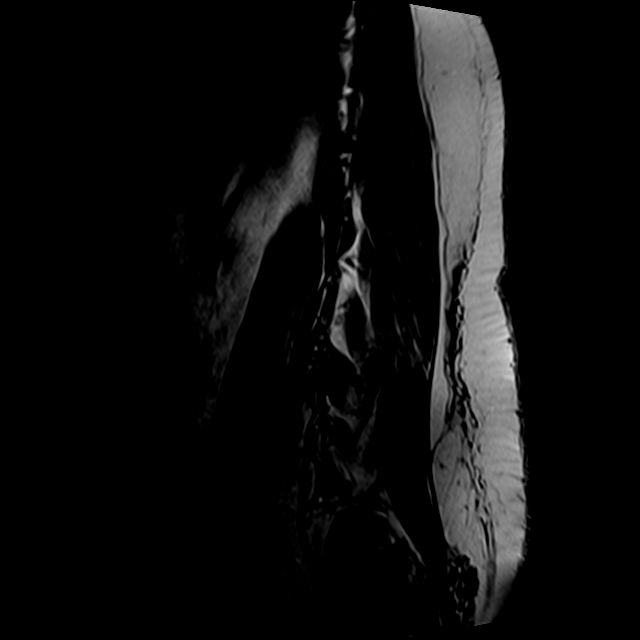
[im 8/15]
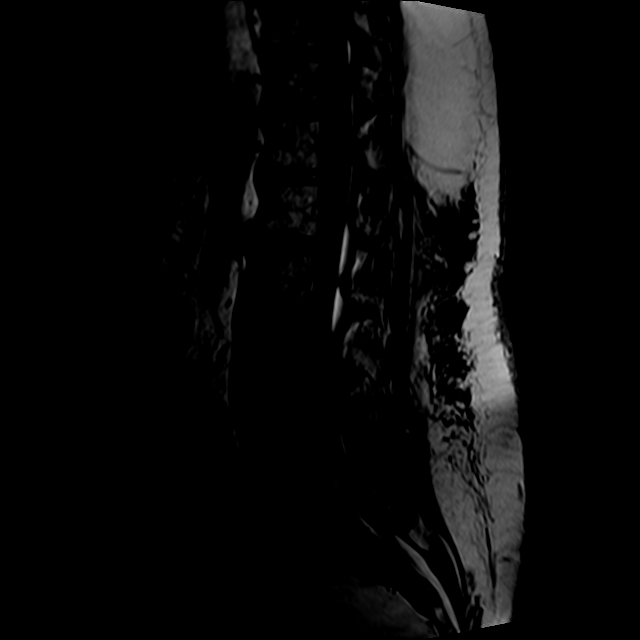
[im 15/15]
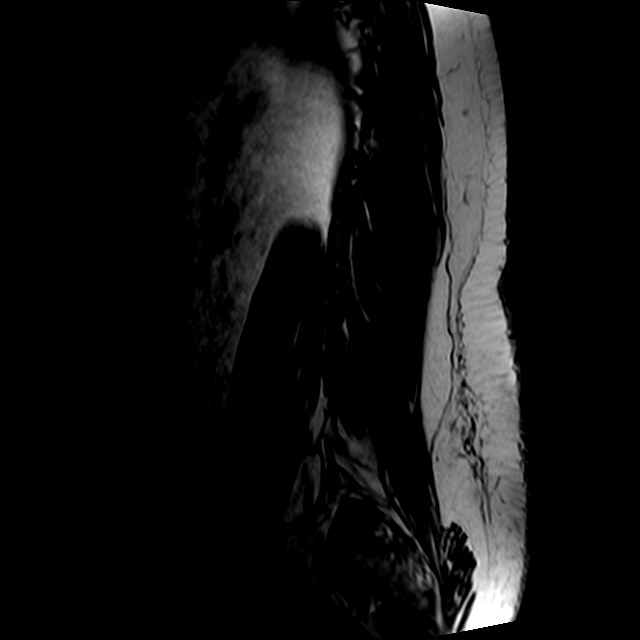

[Series 5: T2 · sagittal · 4.0mm · 0.47mm/px · 3 of 16 slices shown (2 of 3)]
[im 1/16]
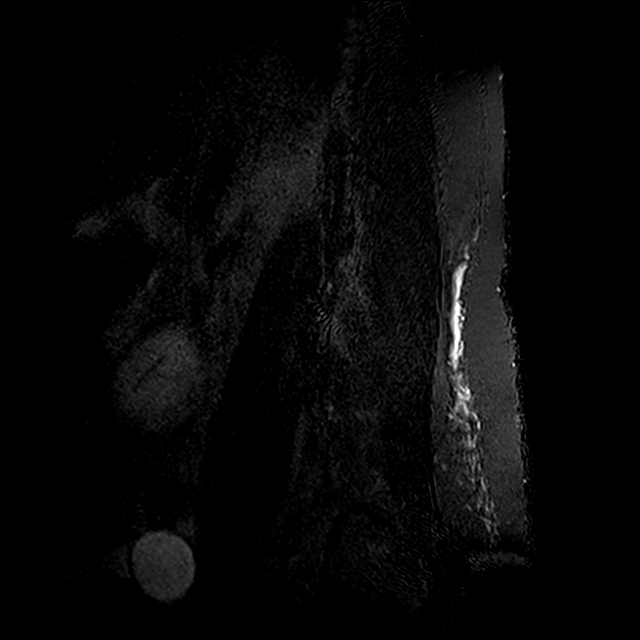
[im 8/16]
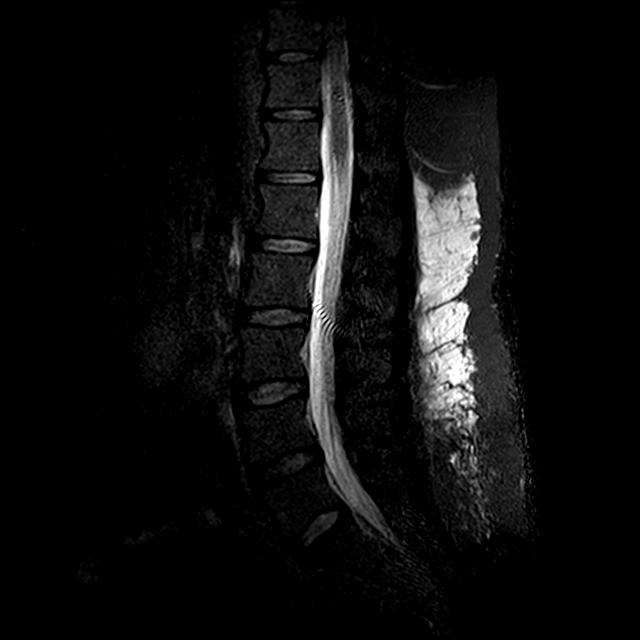
[im 16/16]
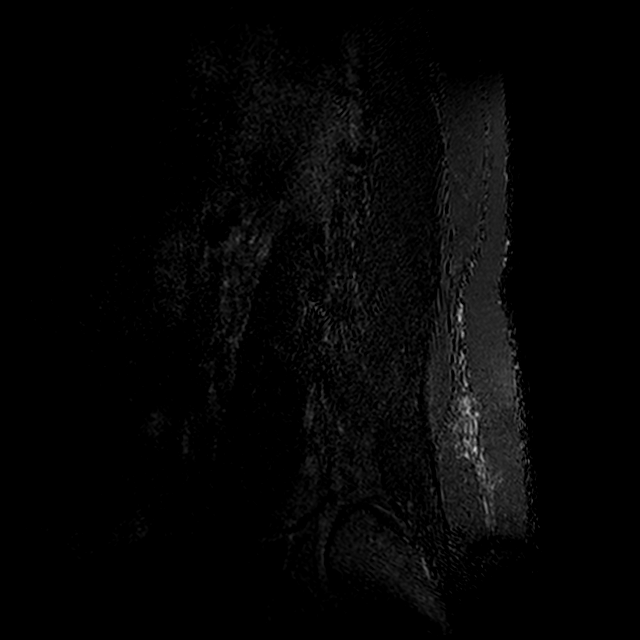

[Series 7: T2 · axial · 4.0mm · 0.28mm/px · z∈[-647,-428]mm · 5 of 54 slices shown (3 of 3)]
[im 1/54]
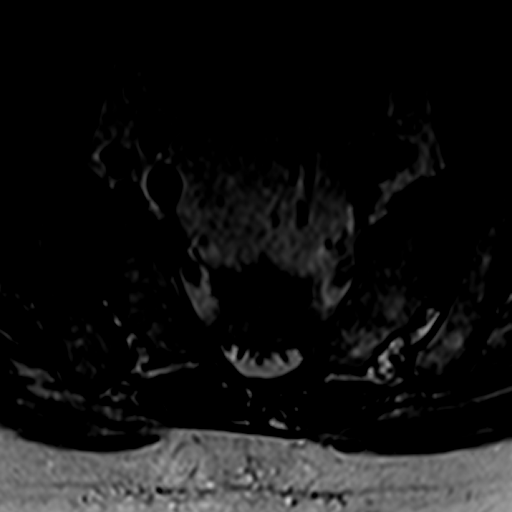
[im 10/54]
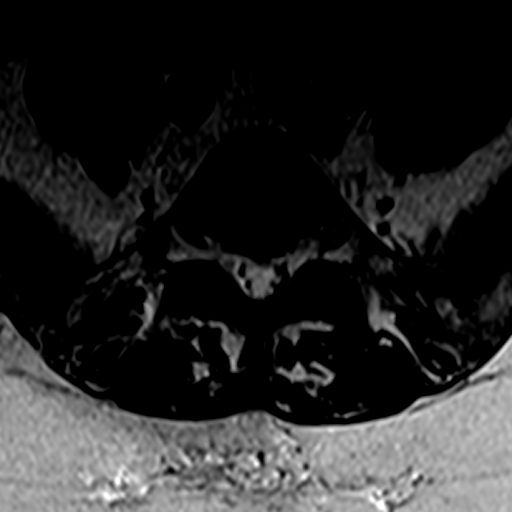
[im 15/54]
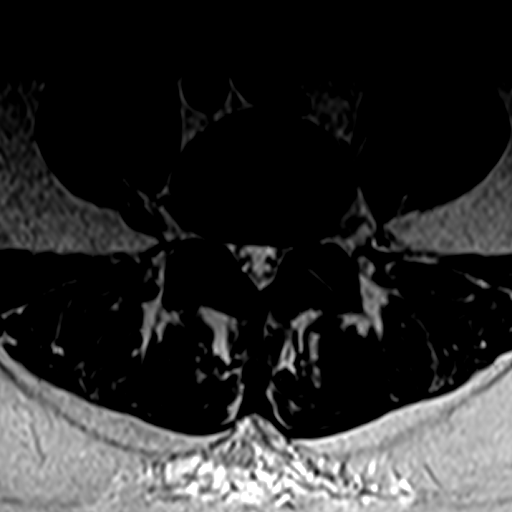
[im 29/54]
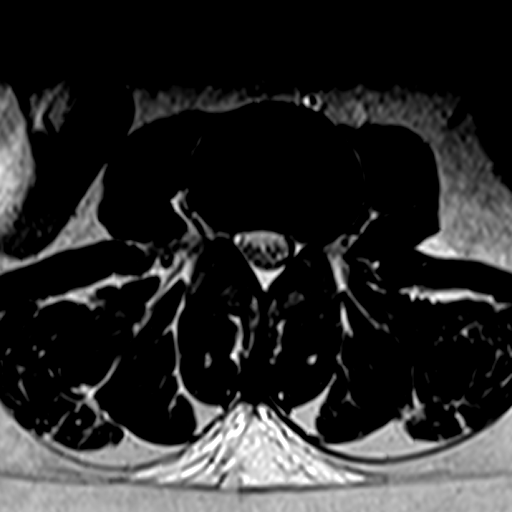
[im 49/54]
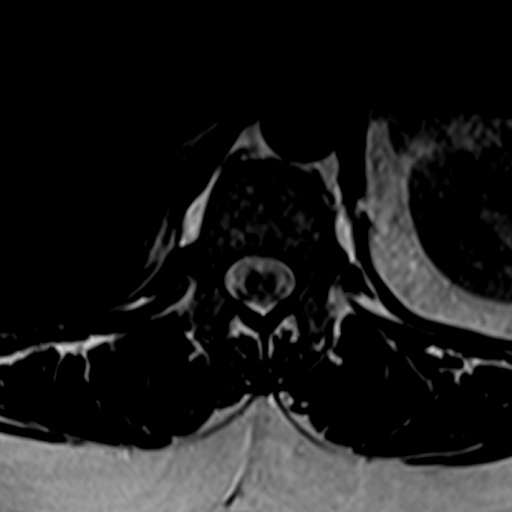

[14 of 48 positions shown; findings below may reference images not displayed]

FINDINGS: MRI CERVICAL SPINE FINDINGS

Alignment: Cervical spine straightening. No listhesis.

Vertebrae: No fracture, suspicious osseous lesion, or significant
marrow edema.

Cord: Normal signal. No abnormal intradural enhancement.

Posterior Fossa, vertebral arteries, paraspinal tissues:
Unremarkable.

Disc levels:

C2-3 and C3-4: Negative.

C4-5: A small central/right central disc protrusion results in a
mild focal indentation of the ventral spinal cord and borderline
spinal stenosis. Patent neural foramina.

C5-6: A small central disc extrusion with slight caudal migration
mildly flattens the ventral spinal cord with borderline to mild
spinal stenosis. Patent neural foramina.

C6-7: A moderate-sized right paracentral disc extrusion results in
moderate right-sided spinal stenosis with mild cord flattening.
Patent neural foramina.

C7-T1: Negative.

MRI THORACIC SPINE FINDINGS

Alignment:  Normal.

Vertebrae: No fracture, suspicious osseous lesion, or significant
marrow edema.

Cord: Normal signal and morphology. No abnormal intradural
enhancement.

Paraspinal and other soft tissues: 1 cm cystic focus to the right of
the T4-5 disc space, benign in appearance.

Disc levels:

Mild thoracic spondylosis with mild anterior vertebral spurring
throughout the mid to lower thoracic spine. No disc herniation,
spinal stenosis, or neural foraminal stenosis.

MRI LUMBAR SPINE FINDINGS

Segmentation:  Standard.

Alignment: Normal.

Vertebrae: No fracture, suspicious osseous lesion, or significant
marrow edema.

Conus medullaris and cauda equina: Conus extends to the T12-L1
level. Conus and cauda equina appear normal.

Paraspinal and other soft tissues: Partially visualized right renal
cyst as seen on a [DATE] abdominal CT.

Disc levels:

L1-2: Negative.

L2-3: Small left foraminal and extraforaminal disc protrusion
without stenosis.

L3-4: Negative.

L4-5: Mild disc desiccation. Mild disc bulging results in mild right
greater than left neural foraminal narrowing without spinal
stenosis.

L5-S1: Negative.
IMPRESSION: 1. Multiple cervical disc herniations, most notable at C6-7 where
there is moderate right-sided spinal stenosis and mild cord
flattening.
2. No spinal cord signal abnormality.
3. Mild thoracic and lumbar spondylosis without evidence of neural
impingement.

## 2020-01-20 IMAGING — MR MR THORACIC SPINE WO/W CM
3 series · 12 of 48 positions shown · IV contrast (10 ml gadavist)
Comparison: None.

CONTRAST:  10 mL Gadavist

CLINICAL DATA: Right facial numbness for 3 months with occasional
right eye twitching. Numbness and tingling in the right greater than
left hands and feet. Leg weakness.

EXAM:
MRI CERVICAL, THORACIC AND LUMBAR SPINE WITHOUT AND WITH CONTRAST
TECHNIQUE: Multiplanar and multiecho pulse sequences of the cervical spine, to
include the craniocervical junction and cervicothoracic junction,
and thoracic and lumbar spine, were obtained without and with
intravenous contrast.

[Series 3: T2 · sagittal · 4.0mm · 0.55mm/px · 6 of 12 slices shown]
[im 1/12]
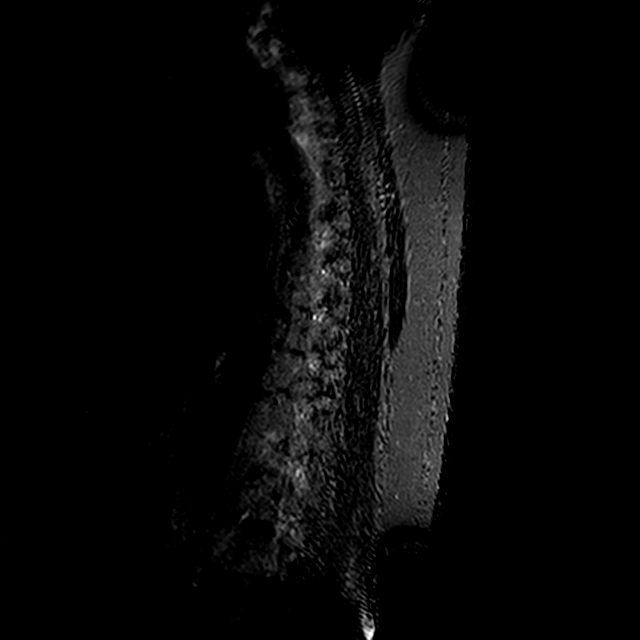
[im 2/12]
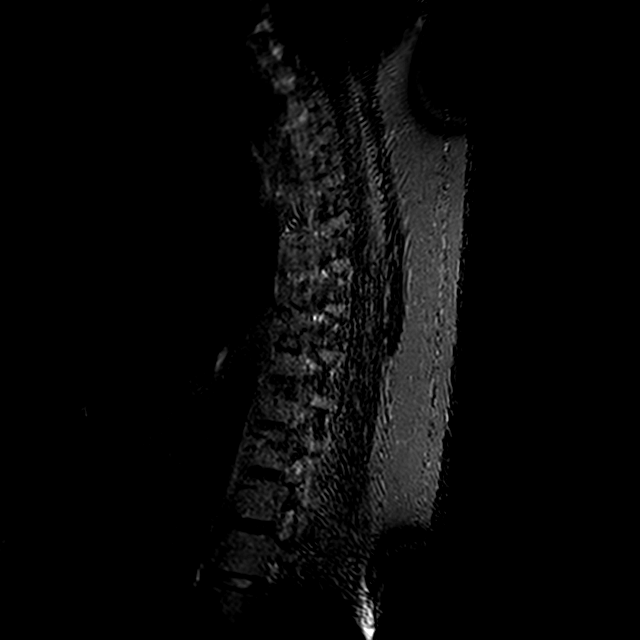
[im 4/12]
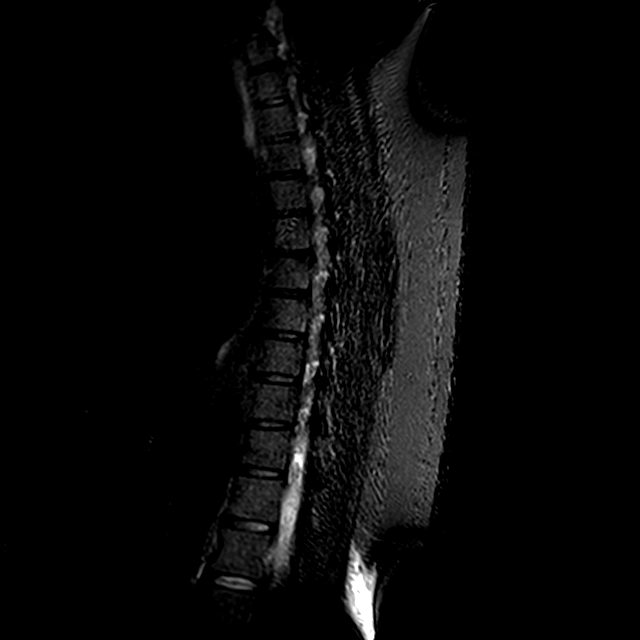
[im 5/12]
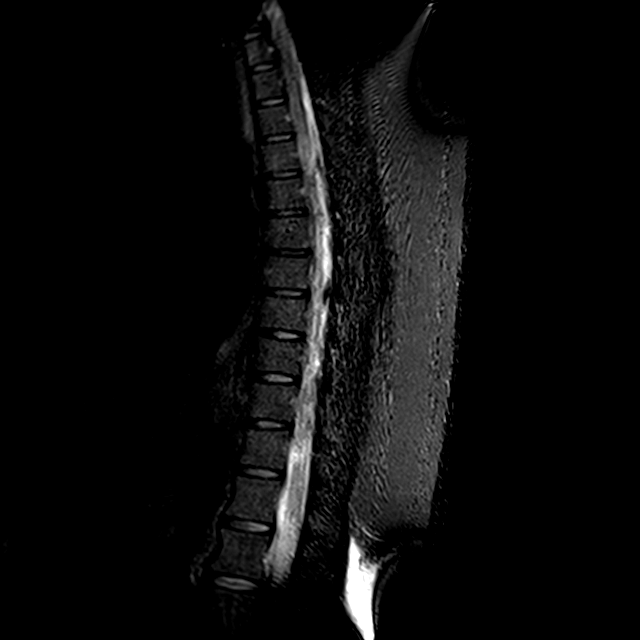
[im 7/12]
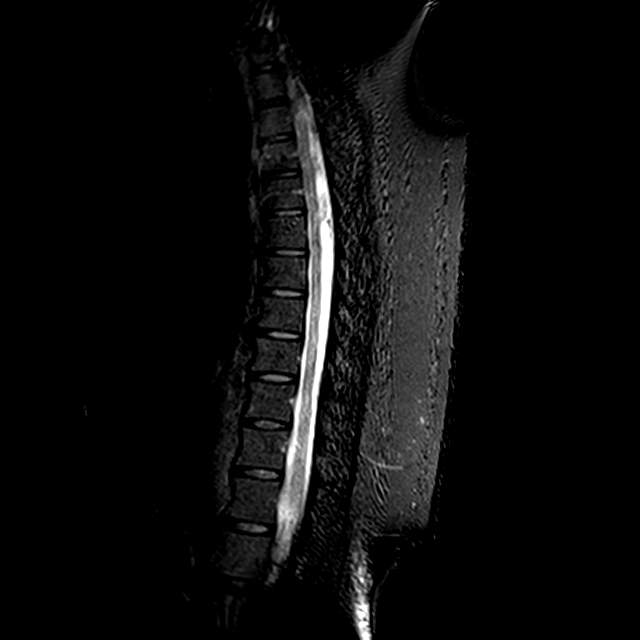
[im 10/12]
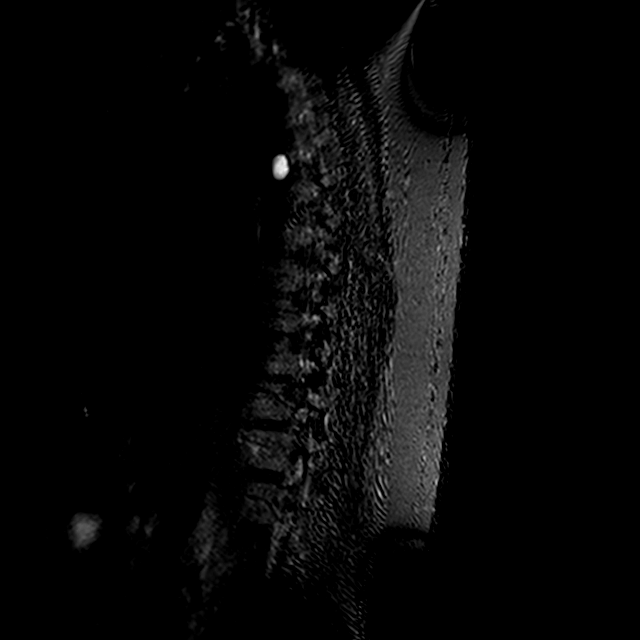

[Series 10: T1 fat-sat post-contrast · sagittal · 4.0mm · 0.53mm/px · 3 of 13 slices shown]
[im 2/13]
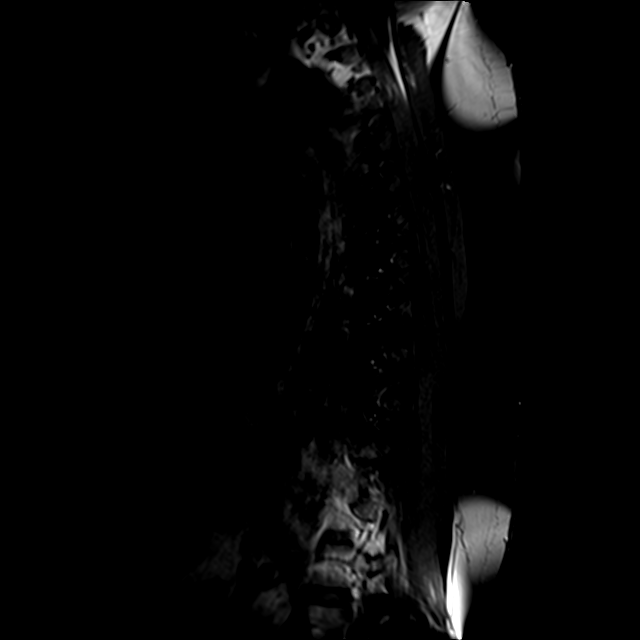
[im 7/13]
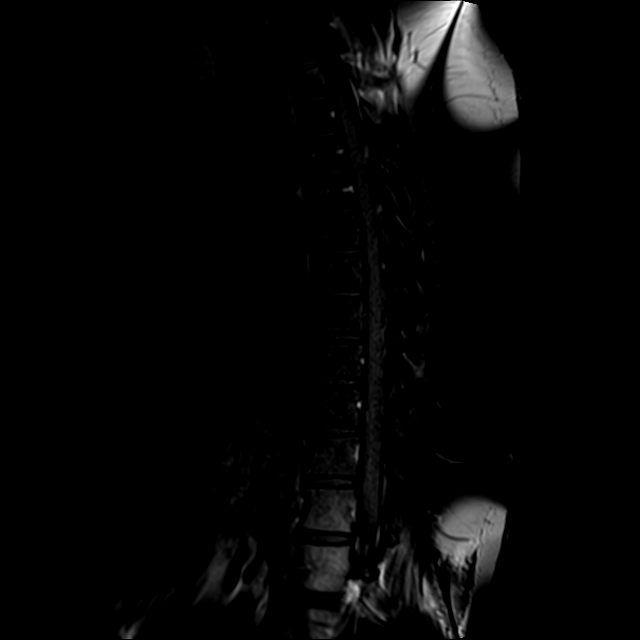
[im 11/13]
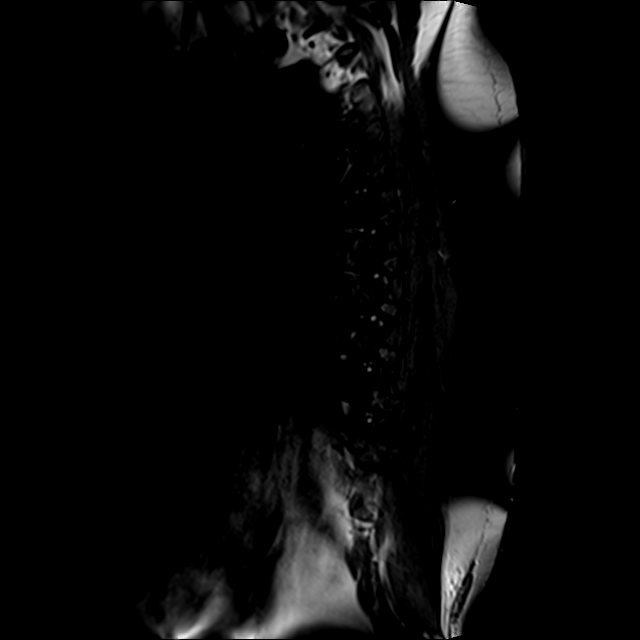

[Series 11: T1 post-contrast · axial · 3.0mm · 0.24mm/px · z∈[-392,-235]mm · 3 of 33 slices shown]
[im 5/33]
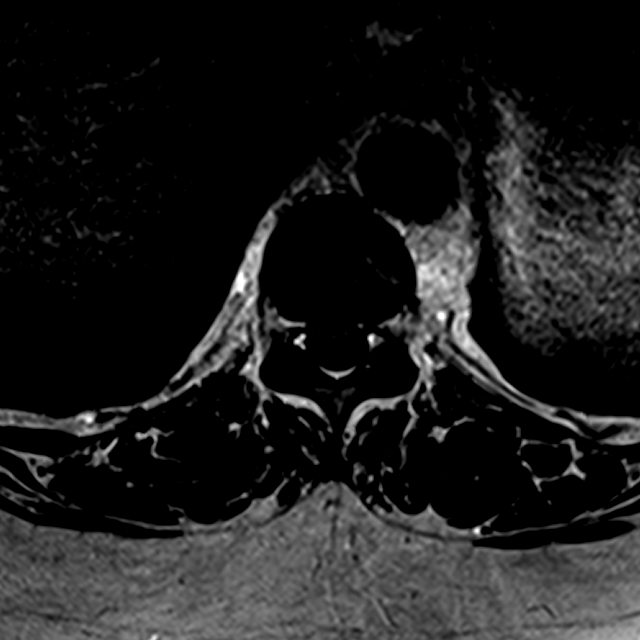
[im 17/33]
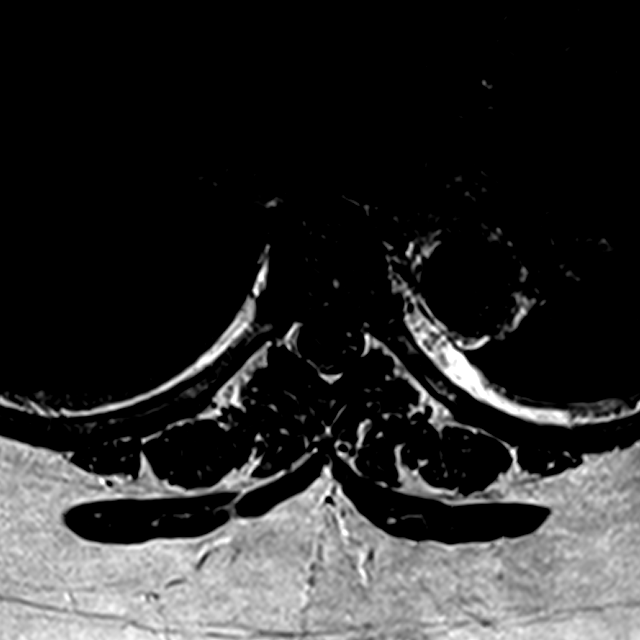
[im 28/33]
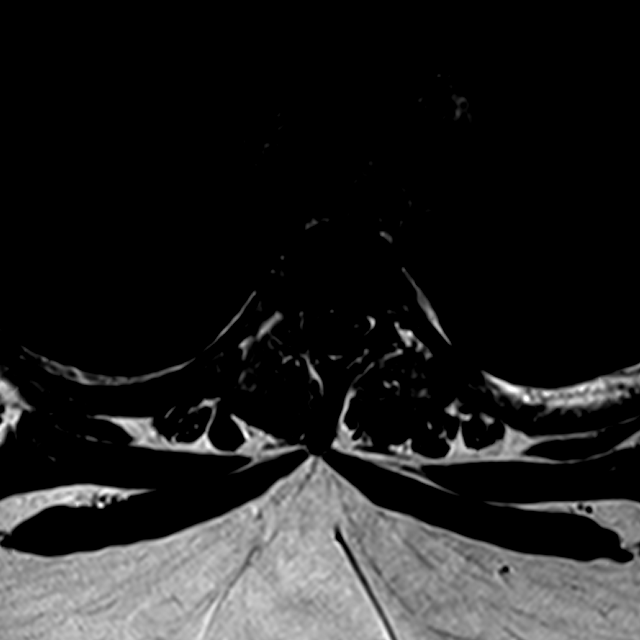

[12 of 48 positions shown; findings below may reference images not displayed]

FINDINGS: MRI CERVICAL SPINE FINDINGS

Alignment: Cervical spine straightening. No listhesis.

Vertebrae: No fracture, suspicious osseous lesion, or significant
marrow edema.

Cord: Normal signal. No abnormal intradural enhancement.

Posterior Fossa, vertebral arteries, paraspinal tissues:
Unremarkable.

Disc levels:

C2-3 and C3-4: Negative.

C4-5: A small central/right central disc protrusion results in a
mild focal indentation of the ventral spinal cord and borderline
spinal stenosis. Patent neural foramina.

C5-6: A small central disc extrusion with slight caudal migration
mildly flattens the ventral spinal cord with borderline to mild
spinal stenosis. Patent neural foramina.

C6-7: A moderate-sized right paracentral disc extrusion results in
moderate right-sided spinal stenosis with mild cord flattening.
Patent neural foramina.

C7-T1: Negative.

MRI THORACIC SPINE FINDINGS

Alignment:  Normal.

Vertebrae: No fracture, suspicious osseous lesion, or significant
marrow edema.

Cord: Normal signal and morphology. No abnormal intradural
enhancement.

Paraspinal and other soft tissues: 1 cm cystic focus to the right of
the T4-5 disc space, benign in appearance.

Disc levels:

Mild thoracic spondylosis with mild anterior vertebral spurring
throughout the mid to lower thoracic spine. No disc herniation,
spinal stenosis, or neural foraminal stenosis.

MRI LUMBAR SPINE FINDINGS

Segmentation:  Standard.

Alignment: Normal.

Vertebrae: No fracture, suspicious osseous lesion, or significant
marrow edema.

Conus medullaris and cauda equina: Conus extends to the T12-L1
level. Conus and cauda equina appear normal.

Paraspinal and other soft tissues: Partially visualized right renal
cyst as seen on a [DATE] abdominal CT.

Disc levels:

L1-2: Negative.

L2-3: Small left foraminal and extraforaminal disc protrusion
without stenosis.

L3-4: Negative.

L4-5: Mild disc desiccation. Mild disc bulging results in mild right
greater than left neural foraminal narrowing without spinal
stenosis.

L5-S1: Negative.
IMPRESSION: 1. Multiple cervical disc herniations, most notable at C6-7 where
there is moderate right-sided spinal stenosis and mild cord
flattening.
2. No spinal cord signal abnormality.
3. Mild thoracic and lumbar spondylosis without evidence of neural
impingement.

## 2020-01-20 MED ORDER — GABAPENTIN 300 MG PO CAPS: 300.0000 mg | ORAL_CAPSULE | Freq: Two times a day (BID) | ORAL | Status: DC

## 2020-01-20 MED ORDER — GADOBUTROL 1 MMOL/ML IV SOLN
10.0000 mL | Freq: Once | INTRAVENOUS | Status: AC | PRN
  Administered 2020-01-20: 10 mL via INTRAVENOUS

## 2020-01-20 MED ORDER — GABAPENTIN 300 MG PO CAPS: 300.0000 mg | ORAL_CAPSULE | Freq: Two times a day (BID) | ORAL | 1 refills | Status: DC

## 2020-01-20 MED ORDER — PANTOPRAZOLE SODIUM 40 MG PO TBEC: 40.0000 mg | DELAYED_RELEASE_TABLET | Freq: Every day | ORAL | Status: DC

## 2020-01-20 MED ORDER — DIPHENHYDRAMINE HCL 25 MG PO CAPS: 25.0000 mg | ORAL_CAPSULE | Freq: Every evening | ORAL | Status: DC | PRN

## 2020-01-20 NOTE — Progress Notes (Signed)
Stated this morning that right side of face continued to feel numb and had tingling in right fingers.  Later in day numbness was gone and stated that it does come and go.

## 2020-01-20 NOTE — Care Management (Signed)
Patient recommended for HHPT. Has Pharmacist, community. Agreeable to do OP PT in [place] as she lives in [place]. Will send referral to [facility] in [place].

## 2020-01-20 NOTE — Consult Note (Signed)
Lamar A. Merlene Laughter, MD     [email or web address]          Heather Fry is an 44 y.o. female.   ASSESSMENT/PLAN: 1. RECURRENT NEUROLOGICAL SYMPTOMS WITHOUT LESIONS ON IMAGING TO EXPLAIN THE SYMPTOMS: THE PATIENT MOST LIKELY HAS NEUROPATHY. LIKELY ETIOLOGIES INCLUDES THYROID DISEASE AND HYPERGLYCEMIA. GABAPENTIN 300 MG TWICE A DAY WILL BE TRIED FOR THESE EPISODIC SYMPTOMS. THIS CAN BE USED ON A AS NEEDED BASIS.    The patient tells me that she underwent thyroidectomy several months ago. One month afterwards, she started having episodic neurological symptoms including focal numbness, tingling and twitching of the facial region on the right side. She also has had episodic numbness, tingling and burning of the feet and hands bilaterally. She has had episodic squeezing like sensation of the abdominal region. There is concern that she may be having the beginnings of multiple sclerosis. She also has been worked up for collagen vascular diseases but only has had a positive ANA which was moderately positive. The patient decided see medical attention yesterday because her symptoms involving the feet and legs got significantly worse. They review of systems otherwise negative.    GENERAL: This is a pleasant female who is doing well. She is markedly overweight.  HEENT: The neck is supple no trauma is noted.  ABDOMEN: Soft  EXTREMITIES: No edema   BACK: Normal alignment.  SKIN: Normal by inspection.    MENTAL STATUS: Alert and oriented. Speech, language and cognition are generally intact. Judgment and insight normal.   CRANIAL NERVES: Pupils are equal, round and reactive to light and accommodation; extraocular movements are full, there is no significant nystagmus; upper and lower facial muscles are normal in strength and symmetric, there is no flattening of the nasolabial folds; tongue is midline; uvula is midline; shoulder elevation is normal.  MOTOR: Normal tone, bulk  and strength; no pronator drift.  COORDINATION: Left finger to nose is normal, right finger to nose is normal, No rest tremor; no intention tremor; no postural tremor; no bradykinesia.  REFLEXES: Deep tendon reflexes are symmetrical and normal. Plantar responses are flexor bilaterally.   SENSATION: Normal to light touch and temperature.   Brain MRI is reviewed in person and shows no abnormalities on DWI or FLAIR. No hemorrhages appreciated. The scan is normal.     Blood pressure (!) 156/98, pulse 87, temperature 98.5 F (36.9 C), temperature source Oral, resp. rate 20, height 5\' 4"  (1.626 m), weight 120.6 kg, last menstrual period 10/09/2014, SpO2 99 %.  Past Medical History:  Diagnosis Date  . Anxiety   . Arthritis   . Depression   . Diverticulosis   . GERD (gastroesophageal reflux disease)   . Graves disease   . Internal hemorrhoids   . Thyroid disease   . Uterine fibroid     Past Surgical History:  Procedure Laterality Date  . ABDOMINAL HYSTERECTOMY     Per patient, uterine fibroids.  . ABDOMINAL HYSTERECTOMY    . CHOLECYSTECTOMY N/A 11/06/2014   Procedure: LAPAROSCOPIC CHOLECYSTECTOMY;  Surgeon: Jamesetta So, MD;  Location: [facility];  Service: General;  Laterality: N/A;  . COLONOSCOPY N/A 03/04/2017   Dr. Gala Romney: Hemorrhoids, diverticulosis, benign polyp without adenomatous changes.  Next colonoscopy 10 years  . DILATION AND CURETTAGE OF UTERUS    . ESOPHAGOGASTRODUODENOSCOPY N/A 10/02/2014   Dr. Gala Romney: fundic gland polyps, hiatal hernia  . MOUTH SURGERY     extraction of teeth  . POLYPECTOMY  03/04/2017   Procedure: POLYPECTOMY;  Surgeon: Daneil Dolin, MD;  Location: [facility];  Service: Endoscopy;;  colon  . THYROIDECTOMY N/A 09/30/2018   Procedure: TOTAL THYROIDECTOMY;  Surgeon: Armandina Gemma, MD;  Location: [facility];  Service: General;  Laterality: N/A;  . TUBAL LIGATION      Family History  Problem Relation Age of Onset  . Hypertension Father   . Diabetes Father    . Depression Other   . Uterine cancer Mother        ?cervical cancer  . Uterine cancer Sister        suicide  . Colon cancer Paternal Aunt 43  . Thyroid disease Neg Hx     Social History:  reports that she quit smoking about 22 years ago. Her smoking use included cigarettes. She has a 5.00 pack-year smoking history. She has never used smokeless tobacco. She reports that she does not drink alcohol or use drugs.  Allergies:  Allergies  Allergen Reactions  . Levofloxacin Other (See Comments)    Pt can not have due to taking Lexapro   . Toradol [Ketorolac Tromethamine] Anaphylaxis and Hives  . Penicillins Other (See Comments)    Loopy, childhood allergy      Medications: Prior to Admission medications   Medication Sig Start Date End Date Taking? Authorizing Provider  acetaminophen (TYLENOL) 500 MG tablet Take 1,000 mg by mouth daily as needed for moderate pain.   Yes [provider]  albuterol (VENTOLIN HFA) 108 (90 Base) MCG/ACT inhaler Inhale 2 puffs into the lungs every 6 (six) hours as needed. 12/13/19  Yes Julian Hy, DO  budesonide-formoterol (SYMBICORT) 160-4.5 MCG/ACT inhaler Inhale 2 puffs into the lungs in the morning and at bedtime. 12/13/19  Yes Julian Hy, DO  cetirizine (ZYRTEC ALLERGY) 10 MG tablet Take 1 tablet (10 mg total) by mouth daily. Patient taking differently: Take 10 mg by mouth at bedtime.  12/13/19  Yes Julian Hy, DO  Cholecalciferol (VITAMIN D3) 125 MCG (5000 UT) CAPS Take 1 capsule (5,000 Units total) by mouth daily. Patient taking differently: Take 10,000 Units by mouth at bedtime.  10/13/19  Yes Renato Shin, MD  docusate sodium (COLACE) 100 MG capsule Take 100 mg by mouth at bedtime.   Yes [provider]  escitalopram (LEXAPRO) 20 MG tablet Take 1 tablet (20 mg total) by mouth at bedtime. 01/26/19  Yes Renato Shin, MD  fluticasone [facility]) 50 MCG/ACT nasal spray Place 2 sprays into both nostrils daily. Patient taking  differently: Place 2 sprays into both nostrils at bedtime.  12/13/19  Yes Julian Hy, DO  Magnesium 250 MG TABS Take 250 mg by mouth at bedtime.    Yes [provider]  methylPREDNISolone (MEDROL) 4 MG tablet Take 4 mg by mouth in the morning and at bedtime.   Yes [provider]  montelukast (SINGULAIR) 10 MG tablet Take 1 tablet (10 mg total) by mouth at bedtime. 12/13/19  Yes Noemi Chapel P, DO  NON FORMULARY at bedtime. CPAP at night    Yes [provider]  omeprazole (PRILOSEC) 20 MG capsule Take 20 mg by mouth 2 (two) times daily before a meal.   Yes [provider]  prednisoLONE acetate (PRED FORTE) 1 % ophthalmic suspension Place 1 drop into both eyes daily. 12/30/19  Yes [provider]  RESTASIS 0.05 % ophthalmic emulsion Place 1 drop into both eyes 2 (two) times daily. 12/26/19  Yes [provider]  simethicone (PHAZYME) 125 MG chewable tablet  Chew 125-250 mg by mouth every 6 (six) hours as needed for flatulence.    Yes [provider]  sucralfate (CARAFATE) 1 g tablet Take 1 g by mouth 4 (four) times daily. 01/18/20  Yes [provider]  SYNTHROID 150 MCG tablet Take 1 tablet (150 mcg total) by mouth daily before breakfast. 10/04/19  Yes Renato Shin, MD  Tiotropium Bromide Monohydrate (SPIRIVA RESPIMAT) 1.25 MCG/ACT AERS Inhale 2 puffs into the lungs daily. 01/05/20  Yes Julian Hy, DO  vitamin B-12 (CYANOCOBALAMIN) 1000 MCG tablet Take 1,000 mcg by mouth at bedtime.   Yes [provider]  Wheat Dextrin (BENEFIBER DRINK MIX PO) Take 1 Dose by mouth 3 (three) times daily.    Yes [provider]    Scheduled Meds: .  stroke: mapping our early stages of recovery book   Does not apply Once  . cholecalciferol  5,000 Units Oral Daily  . docusate sodium  100 mg Oral QHS  . enoxaparin (LOVENOX) injection  40 mg Subcutaneous QHS  . escitalopram  20 mg Oral QHS  . fluticasone  2 spray Each Nare Daily  .  fluticasone furoate-vilanterol  1 puff Inhalation Daily  . levothyroxine  150 mcg Oral Q0600  . magnesium oxide  400 mg Oral Daily  . methylPREDNISolone  4 mg Oral BID  . montelukast  10 mg Oral QHS  . pantoprazole  40 mg Oral QHS  . psyllium  1 packet Oral TID  . umeclidinium bromide  1 puff Inhalation Daily   Continuous Infusions: PRN Meds:.acetaminophen **OR** acetaminophen (TYLENOL) oral liquid 160 mg/5 mL **OR** acetaminophen, albuterol, senna-docusate     Results for orders placed or performed during the hospital encounter of 01/19/20 (from the past 48 hour(s))  CBG monitoring, ED     Status: Abnormal   Collection Time: 01/19/20  7:55 PM  Result Value Ref Range   Glucose-Capillary 101 (H) 70 - 99 mg/dL    Comment: Glucose reference range applies only to samples taken after fasting for at least 8 hours.  Ethanol     Status: None   Collection Time: 01/19/20  7:57 PM  Result Value Ref Range   Alcohol, Ethyl (B) <10 <10 mg/dL    Comment: (NOTE) Lowest detectable limit for serum alcohol is 10 mg/dL. For medical purposes only. Performed at [facility], [address] [place], [place] [address]   Protime-INR     Status: None   Collection Time: 01/19/20  7:57 PM  Result Value Ref Range   Prothrombin Time 12.8 11.4 - 15.2 seconds   INR 1.0 0.8 - 1.2    Comment: (NOTE) INR goal varies based on device and disease states. Performed at [facility], [address] [place], [place] [address]   APTT     Status: None   Collection Time: 01/19/20  7:57 PM  Result Value Ref Range   aPTT 32 24 - 36 seconds    Comment: Performed at [facility], [address], [place], [place] [address]  CBC     Status: Abnormal   Collection Time: 01/19/20  7:57 PM  Result Value Ref Range   WBC 10.9 (H) 4.0 - 10.5 K/uL   RBC 4.51 3.87 - 5.11 MIL/uL   Hemoglobin 13.2 12.0 - 15.0 g/dL   HCT 40.6 36.0 - 46.0 %   MCV 90.0 80.0 - 100.0 fL   MCH 29.3 26.0 - 34.0 pg   MCHC 32.5 30.0 - 36.0 g/dL   RDW  14.5 11.5 - 15.5 %   Platelets 268 150 - 400 K/uL   nRBC 0.0 0.0 - 0.2 %    Comment: Performed at [facility], [address] [place], [place] [address]  Differential     Status: Abnormal   Collection Time: 01/19/20  7:57 PM  Result Value Ref Range   Neutrophils Relative % 72 %   Neutro Abs 8.0 (H) 1.7 - 7.7 K/uL   Lymphocytes Relative 21 %   Lymphs Abs 2.2 0.7 - 4.0 K/uL   Monocytes Relative 5 %   Monocytes Absolute 0.5 0.1 - 1.0 K/uL   Eosinophils Relative 1 %   Eosinophils Absolute 0.1 0.0 - 0.5 K/uL   Basophils Relative 0 %   Basophils Absolute 0.0 0.0 - 0.1 K/uL   Immature Granulocytes 1 %   Abs Immature Granulocytes 0.05 0.00 - 0.07 K/uL    Comment: Performed at [facility], [address] [place], [place] [address]  Comprehensive metabolic panel     Status: Abnormal   Collection Time: 01/19/20  7:57 PM  Result Value Ref Range   Sodium 138 135 - 145 mmol/L   Potassium 3.9 3.5 - 5.1 mmol/L   Chloride 102 98 - 111 mmol/L   CO2 25 22 - 32 mmol/L   Glucose, Bld 106 (H) 70 - 99 mg/dL    Comment: Glucose reference range applies only to samples taken after fasting for at least 8 hours.   BUN 13 6 - 20 mg/dL   Creatinine, Ser 0.76 0.44 - 1.00 mg/dL   Calcium 9.4 8.9 - 10.3 mg/dL   Total Protein 7.6 6.5 - 8.1 g/dL   Albumin 3.7 3.5 - 5.0 g/dL   AST 17 15 - 41 U/L   ALT 24 0 - 44 U/L   Alkaline Phosphatase 50 38 - 126 U/L   Total Bilirubin 0.4 0.3 - 1.2 mg/dL   GFR calc non Af Amer >60 >60 mL/min   GFR calc Af Amer >60 >60 mL/min   Anion gap 11 5 - 15    Comment: Performed at [facility], [address], [place], [place] [address]  TSH     Status: None   Collection Time: 01/19/20  7:57 PM  Result Value Ref Range   TSH 2.374 0.350 - 4.500 uIU/mL    Comment: Performed by a 3rd Generation assay with a functional sensitivity of <=0.01 uIU/mL. Performed at [facility], [address] [place], [place] [address]   Hemoglobin A1c     Status: Abnormal   Collection Time:  01/19/20  7:57 PM  Result Value Ref Range   Hgb A1c MFr Bld 6.2 (H) 4.8 - 5.6 %    Comment: (NOTE) Pre diabetes:          5.7%-6.4% Diabetes:              >6.4% Glycemic control for   <7.0% adults with diabetes    Mean Plasma Glucose 131.24 mg/dL    Comment: Performed at [facility] [address] [place], [place] [address]  Vitamin B12     Status: None   Collection Time: 01/19/20  7:57 PM  Result Value Ref Range   Vitamin B-12 851 180 - 914 pg/mL    Comment: (NOTE) This assay is not validated for testing neonatal or myeloproliferative syndrome specimens for Vitamin B12 levels. Performed at [facility], [address], [place],  [address]   [place] [address], ED     Status:  Abnormal   Collection Time: 01/19/20  8:17 PM  Result Value Ref Range   Sodium 138 135 - 145 mmol/L   Potassium 3.9 3.5 - 5.1 mmol/L   Chloride 101 98 - 111 mmol/L   BUN 12 6 - 20 mg/dL   Creatinine, Ser 0.70 0.44 - 1.00 mg/dL   Glucose, Bld 104 (H) 70 - 99 mg/dL    Comment: Glucose reference range applies only to samples taken after fasting for at least 8 hours.   Calcium, Ion 1.25 1.15 - 1.40 mmol/L   TCO2 27 22 - 32 mmol/L   Hemoglobin 13.3 12.0 - 15.0 g/dL   HCT 39.0 36.0 - 46.0 %  Urine rapid drug screen (hosp performed)     Status: None   Collection Time: 01/19/20  8:30 PM  Result Value Ref Range   Opiates NONE DETECTED NONE DETECTED   Cocaine NONE DETECTED NONE DETECTED   Benzodiazepines NONE DETECTED NONE DETECTED   Amphetamines NONE DETECTED NONE DETECTED   Tetrahydrocannabinol NONE DETECTED NONE DETECTED   Barbiturates NONE DETECTED NONE DETECTED    Comment: (NOTE) DRUG SCREEN FOR MEDICAL PURPOSES ONLY.  IF CONFIRMATION IS NEEDED FOR ANY PURPOSE, NOTIFY LAB WITHIN 5 DAYS. LOWEST DETECTABLE LIMITS FOR URINE DRUG SCREEN Drug Class                     Cutoff (ng/mL) Amphetamine and metabolites    1000 Barbiturate and metabolites    200 Benzodiazepine                 [id number]  Tricyclics and metabolites     300 Opiates and metabolites        300 Cocaine and metabolites        300 THC                            50 Performed at [facility], [address] [place], [place] [address]   Urinalysis, Routine w reflex microscopic     Status: Abnormal   Collection Time: 01/19/20  8:30 PM  Result Value Ref Range   Color, Urine YELLOW YELLOW   APPearance CLOUDY (A) CLEAR   Specific Gravity, Urine 1.013 1.005 - 1.030   pH 9.0 (H) 5.0 - 8.0   Glucose, UA NEGATIVE NEGATIVE mg/dL   Hgb urine dipstick NEGATIVE NEGATIVE   Bilirubin Urine NEGATIVE NEGATIVE   Ketones, ur NEGATIVE NEGATIVE mg/dL   Protein, ur NEGATIVE NEGATIVE mg/dL   Nitrite NEGATIVE NEGATIVE   Leukocytes,Ua NEGATIVE NEGATIVE    Comment: Performed at [facility], [address] [place], [place] [address]  Respiratory Panel by RT PCR (Flu A&B, Covid) - Nasopharyngeal Swab     Status: None   Collection Time: 01/19/20  8:30 PM   Specimen: Nasopharyngeal Swab  Result Value Ref Range   SARS Coronavirus 2 by RT PCR NEGATIVE NEGATIVE    Comment: (NOTE) SARS-CoV-2 target nucleic acids are NOT DETECTED. The SARS-CoV-2 RNA is generally detectable in upper respiratoy specimens during the acute phase of infection. The lowest concentration of SARS-CoV-2 viral copies this assay can detect is 131 copies/mL. A negative result does not preclude SARS-Cov-2 infection and should not be used as the sole basis for treatment or other patient management decisions. A negative result may occur with  improper specimen collection/handling, submission of specimen other than nasopharyngeal swab, presence of viral mutation(s) within the areas targeted by this assay, and inadequate number  of viral copies (<131 copies/mL). A negative result must be combined with clinical observations, patient history, and epidemiological information. The expected result is Negative. Fact Sheet for Patients:   PinkCheek.be Fact Sheet for Healthcare Providers:  GravelBags.it This test is not yet ap proved or cleared by the [place] FDA and  has been authorized for detection and/or diagnosis of SARS-CoV-2 by FDA under an Emergency Use Authorization (EUA). This EUA will remain  in effect (meaning this test can be used) for the duration of the COVID-19 declaration under Section 564(b)(1) of the Act, 21 U.S.C. section 360bbb-3(b)(1), unless the authorization is terminated or revoked sooner.    Influenza A by PCR NEGATIVE NEGATIVE   Influenza B by PCR NEGATIVE NEGATIVE    Comment: (NOTE) The Xpert Xpress SARS-CoV-2/FLU/RSV assay is intended as an aid in  the diagnosis of influenza from Nasopharyngeal swab specimens and  should not be used as a sole basis for treatment. Nasal washings and  aspirates are unacceptable for Xpert Xpress SARS-CoV-2/FLU/RSV  testing. Fact Sheet for Patients: PinkCheek.be Fact Sheet for Healthcare Providers: GravelBags.it This test is not yet approved or cleared by the [place] FDA and  has been authorized for detection and/or diagnosis of SARS-CoV-2 by  FDA under an Emergency Use Authorization (EUA). This EUA will remain  in effect (meaning this test can be used) for the duration of the  Covid-19 declaration under Section 564(b)(1) of the Act, 21  U.S.C. section 360bbb-3(b)(1), unless the authorization is  terminated or revoked. Performed at [facility], [address] [place], [place] [address]   HIV Antibody (routine testing w rflx)     Status: None   Collection Time: 01/20/20  4:17 AM  Result Value Ref Range   HIV Screen 4th Generation wRfx NON REACTIVE NON REACTIVE    Comment: Performed at [facility] [address] [place], [place] [address]  Lipid panel     Status: Abnormal   Collection Time: 01/20/20  4:17 AM  Result Value  Ref Range   Cholesterol 222 (H) 0 - 200 mg/dL   Triglycerides 306 (H) <150 mg/dL   HDL 55 >40 mg/dL   Total CHOL/HDL Ratio 4.0 RATIO   VLDL 61 (H) 0 - 40 mg/dL   LDL Cholesterol 106 (H) 0 - 99 mg/dL    Comment:        Total Cholesterol/HDL:CHD Risk Coronary Heart Disease Risk Table                     Men   Women  1/2 Average Risk   3.4   3.3  Average Risk       5.0   4.4  2 X Average Risk   9.6   7.1  3 X Average Risk  23.4   11.0        Use the calculated Patient Ratio above and the CHD Risk Table to determine the patient's CHD Risk.        ATP III CLASSIFICATION (LDL):  <100     mg/dL   Optimal  100-129  mg/dL   Near or Above                    Optimal  130-159  mg/dL   Borderline  160-189  mg/dL   High  >190     mg/dL   Very High Performed at [place] [place],  [address]   TSH  Status: None   Collection Time: 01/20/20  4:17 AM  Result Value Ref Range   TSH 3.353 0.350 - 4.500 uIU/mL    Comment: Performed by a 3rd Generation assay with a functional sensitivity of <=0.01 uIU/mL. Performed at [facility], [address] [place], [place] [address]   Folate     Status: None   Collection Time: 01/20/20  7:33 AM  Result Value Ref Range   Folate 10.4 >5.9 ng/mL    Comment: Performed at [facility], [address] [place], [place] [address]  Iron and TIBC     Status: None   Collection Time: 01/20/20  7:33 AM  Result Value Ref Range   Iron 72 28 - 170 ug/dL   TIBC 358 250 - 450 ug/dL   Saturation Ratios 20 10.4 - 31.8 %   UIBC 286 ug/dL    Comment: Performed at [facility], [address] [place], [place] [address]  Ferritin     Status: None   Collection Time: 01/20/20  7:33 AM  Result Value Ref Range   Ferritin 40 11 - 307 ng/mL    Comment: Performed at [facility], [address], [place], [place] [address]  RPR     Status: None   Collection Time: 01/20/20  7:33 AM  Result Value Ref Range   RPR Ser Ql NON REACTIVE NON REACTIVE     Comment: Performed at [facility] [address], [place], [place] [address]  Magnesium     Status: None   Collection Time: 01/20/20  7:33 AM  Result Value Ref Range   Magnesium 2.0 1.7 - 2.4 mg/dL    Comment: Performed at [facility], [address], [place], [place] [address]  Troponin I (High Sensitivity)     Status: None   Collection Time: 01/20/20  7:33 AM  Result Value Ref Range   Troponin I (High Sensitivity) 4 <18 ng/L    Comment: (NOTE) Elevated high sensitivity troponin I (hsTnI) values and significant  changes across serial measurements may suggest ACS but many other  chronic and acute conditions are known to elevate hsTnI results.  Refer to the "Links" section for chest pain algorithms and additional  guidance. Performed at [facility], [address] [place], [place] [address]   CK     Status: None   Collection Time: 01/20/20  7:33 AM  Result Value Ref Range   Total CK 46 38 - 234 U/L    Comment: Performed at [facility], [address] [place], [place] [address]  Sedimentation rate     Status: Abnormal   Collection Time: 01/20/20  7:33 AM  Result Value Ref Range   Sed Rate 36 (H) 0 - 22 mm/hr    Comment: Performed at [facility], [address] [place], [place] [address]  C-reactive protein     Status: Abnormal   Collection Time: 01/20/20  7:33 AM  Result Value Ref Range   CRP 1.7 (H) <1.0 mg/dL    Comment: Performed at [facility], [address] [place], [place] [address]  Troponin I (High Sensitivity)     Status: None   Collection Time: 01/20/20  9:06 AM  Result Value Ref Range   Troponin I (High Sensitivity) 3 <18 ng/L    Comment: (NOTE) Elevated high sensitivity troponin I (hsTnI) values and significant  changes across serial measurements may suggest ACS but many other  chronic and acute conditions are known to elevate hsTnI results.  Refer  to the "Links" section for chest pain algorithms and additional  guidance. Performed at [facility], [address], [place], [place] [address]     Studies/Results:  BRAIN MRI FINDINGS: Brain: There is no evidence of acute infarct, intracranial hemorrhage, mass, midline shift, or extra-axial fluid collection. The ventricles and sulci are normal. The brain is normal in signal. No abnormal enhancement is identified.  Vascular: Major intracranial vascular flow voids are preserved.  Skull and upper cervical spine: No suspicious marrow lesion.  Sinuses/Orbits: Unremarkable orbits. Paranasal sinuses and mastoid air cells are clear.  Other: None.  IMPRESSION: Negative brain MRI.       EXAM: MRI CERVICAL, THORACIC AND LUMBAR SPINE WITHOUT AND WITH CONTRAST  TECHNIQUE: Multiplanar and multiecho pulse sequences of the cervical spine, to include the craniocervical junction and cervicothoracic junction, and thoracic and lumbar spine, were obtained without and with intravenous contrast.  COMPARISON:  None.  CONTRAST:  10 mL Gadavist  FINDINGS: MRI CERVICAL SPINE FINDINGS  Alignment: Cervical spine straightening. No listhesis.  Vertebrae: No fracture, suspicious osseous lesion, or significant marrow edema.  Cord: Normal signal. No abnormal intradural enhancement.  Posterior Fossa, vertebral arteries, paraspinal tissues: Unremarkable.  Disc levels:  C2-3 and C3-4: Negative.  C4-5: A small central/right central disc protrusion results in a mild focal indentation of the ventral spinal cord and borderline spinal stenosis. Patent neural foramina.  C5-6: A small central disc extrusion with slight caudal migration mildly flattens the ventral spinal cord with borderline to mild spinal stenosis. Patent neural foramina.  C6-7: A moderate-sized right paracentral disc extrusion results in moderate right-sided spinal stenosis with mild cord flattening. Patent neural foramina.  C7-T1: Negative.  MRI THORACIC SPINE FINDINGS  Alignment:  Normal.   Vertebrae: No fracture, suspicious osseous lesion, or significant marrow edema.  Cord: Normal signal and morphology. No abnormal intradural enhancement.  Paraspinal and other soft tissues: 1 cm cystic focus to the right of the T4-5 disc space, benign in appearance.  Disc levels:  Mild thoracic spondylosis with mild anterior vertebral spurring throughout the mid to lower thoracic spine. No disc herniation, spinal stenosis, or neural foraminal stenosis.  MRI LUMBAR SPINE FINDINGS  Segmentation:  Standard.  Alignment: Normal.  Vertebrae: No fracture, suspicious osseous lesion, or significant marrow edema.  Conus medullaris and cauda equina: Conus extends to the T12-L1 level. Conus and cauda equina appear normal.  Paraspinal and other soft tissues: Partially visualized right renal cyst as seen on a 04/07/2019 abdominal CT.  Disc levels:  L1-2: Negative.  L2-3: Small left foraminal and extraforaminal disc protrusion without stenosis.  L3-4: Negative.  L4-5: Mild disc desiccation. Mild disc bulging results in mild right greater than left neural foraminal narrowing without spinal stenosis.  L5-S1: Negative.  IMPRESSION: 1. 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 2. No spinal cord signal abnormality. 3. Mild thoracic and lumbar spondylosis without evidence of neural impingement.    Heather Fry, M.D.  Diplomate, Tax adviser of Psychiatry and Neurology ( Neurology). 01/20/2020, 4:39 PM

## 2020-01-20 NOTE — Progress Notes (Signed)
IV's removed and discharge instructions reviewed.  Family to drive home

## 2020-01-20 NOTE — Progress Notes (Signed)
PROGRESS NOTE  Heather Fry PJK:[id number] DOB: 06-17-1976 DOA: 01/19/2020 PCP: Redmond School, MD  Brief History:  44 year old female with a history of hypothyroidism, GERD, anxiety/depression, B12 deficiency, OSA, asthma presenting with approximately 6-monthhistory of right facial numbness from her forehead to her chin, but primarily located around the periorbital area.  She denies any tearing states that she has occasional twitching of her right eye and periorbital area.  She states the sensations last approximately 5 minutes and occurs every day.  In addition, the patient has been having bilateral hand numbness and tingling, right greater than left hand.  She also complains of bilateral numbness and tingling in her right greater than left foot.  Occasionally her sensory disturbance migrates proximally to her knees or elbows.  She has an occasional headache with the symptoms but denies any visual loss or focal extremity weakness.  However, the patient experienced some leg weakness and a "noodle-like sensation" of her bilateral legs in the afternoon on 01/19/2020.  As result, she presented for further evaluation.  Notably, the patient states that her rheumatologist placed her on methylprednisolone 4 mg twice daily which she has been taking for the last 2 months.  She stated that this was started secondary to an elevated sedimentation rate as well as swelling and numbness in her hands.  The patient has had a 3-pack-year history of tobacco quitting 2 years ago.  She denies any alcohol or illegal drug use.  The patient also describes some intermittent chest discomfort that is sharp in nature substernal lasting a few seconds.  She has not had any worsening shortness of breath, coughing, hemoptysis, nausea, vomiting, diarrhea, abdominal pain, dysuria.  She denies any syncope, presyncope, or memory loss. In the emergency department, the patient was worked up as a code stroke.  CT of the brain was  negative.  BMP, LFTs, and CBC were essentially unremarkable.  The patient was afebrile hemodynamically stable.  She was mended for further neurologic evaluation.  Assessment/Plan: Sensory disturbance -Etiology unclear, but concerns for trigeminal neuralgia, peripheral neuropathy, and possible demyelination syndrome -Serum B12--851 -TSH 2.374 -12/20/2019 hemoglobin AY0D6.1 -Folic acid -UA negative for pyuria -Urine drug screen--neg -RPR -MRI brain -MRI cervical, thoracic, lumbar spine -Check iron studies -neurology consult -EEG -ESR -CRP  Asthma, type unspecified -Stable on room air -Continue Breo and Incruse and singulair  Morbid obesity -BMI 45.64 -Lifestyle modification  OSA -Continue CPAP  Hypothyroidism -TSH 2.374 -Continue levothyroxine  Depression/anxiety -Continue Lexapro and Cymbalta  Impaired Glucose Tolerance -lifestyle modification  Atypical Chest pain -personally reviewed EKG--sinus, no STT changes -cycle troponin -CXR    Disposition Plan: Patient From: Home D/C Place: Home - 1-2  Days Barriers: Neurology work up in progress  Family Communication:  No Family at bedside  Consultants:  neurology  Code Status:  FULL  DVT Prophylaxis: [place] Lovenox   Procedures: As Listed in Progress Note Above  Antibiotics: None       Subjective: The patient continues to complain of numbness and tingling in her hands and her feet.  She has intermittent chest discomfort without any worsening shortness of breath.  She denies any hemoptysis.  There is no nausea, vomiting, diarrhea, abdominal pain, dysuria, hematuria.  Objective: Vitals:   01/20/20 0035 01/20/20 0058 01/20/20 0259 01/20/20 0507  BP:  132/84 128/78 123/77  Pulse: 83 86 98 80  Resp: _0 Temp:  98.8 F (37.1 C) 98.7 F (37.1  C) 98.5 F (36.9 C)  TempSrc:  Oral Oral Oral  SpO2: 96% 98% 100% 98%  Weight:      Height:       No intake or output data in the 24 hours ending  01/20/20 0654 Weight change:  Exam:   General:  Pt is alert, follows commands appropriately, not in acute distress  HEENT: No icterus, No thrush, No neck mass, [place]/AT  Cardiovascular: RRR, S1/S2, no rubs, no gallops  Respiratory: CTA bilaterally, no wheezing, no crackles, no rhonchi  Abdomen: Soft/+BS, non tender, non distended, no guarding  Extremities: No edema, No lymphangitis, No petechiae, No rashes, no synovitis  Neuro:  CN II-XII intact, strength 4/5 in RUE, RLE, strength 4/5 LUE, LLE; sensation intact bilateral; no dysmetria; babinski equivocal     Data Reviewed: I have personally reviewed following labs and imaging studies Basic Metabolic Panel: Recent Labs  Lab 01/19/20 1957 01/19/20 2017  NA 138 138  K 3.9 3.9  CL 102 101  CO2 25  --   GLUCOSE 106* 104*  BUN 13 12  CREATININE 0.76 0.70  CALCIUM 9.4  --    Liver Function Tests: Recent Labs  Lab 01/19/20 1957  AST 17  ALT 24  ALKPHOS 50  BILITOT 0.4  PROT 7.6  ALBUMIN 3.7   No results for input(s): LIPASE, AMYLASE in the last 168 hours. No results for input(s): AMMONIA in the last 168 hours. Coagulation Profile: Recent Labs  Lab 01/19/20 1957  INR 1.0   CBC: Recent Labs  Lab 01/19/20 1957 01/19/20 2017  WBC 10.9*  --   NEUTROABS 8.0*  --   HGB 13.2 13.3  HCT 40.6 39.0  MCV 90.0  --   PLT 268  --    Cardiac Enzymes: No results for input(s): CKTOTAL, CKMB, CKMBINDEX, TROPONINI in the last 168 hours. BNP: Invalid input(s): POCBNP CBG: Recent Labs  Lab 01/19/20 1955  GLUCAP 101*   HbA1C: No results for input(s): HGBA1C in the last 72 hours. Urine analysis:    Component Value Date/Time   COLORURINE YELLOW 01/19/2020 2030   APPEARANCEUR CLOUDY (A) 01/19/2020 2030   LABSPEC 1.013 01/19/2020 2030   PHURINE 9.0 (H) 01/19/2020 2030   GLUCOSEU NEGATIVE 01/19/2020 2030   [place] NEGATIVE 01/19/2020 2030   [place] NEGATIVE 01/19/2020 2030   [place] 01/19/2020 2030    PROTEINUR NEGATIVE 01/19/2020 2030   UROBILINOGEN 0.2 08/10/2014 1117   NITRITE NEGATIVE 01/19/2020 2030   LEUKOCYTESUR NEGATIVE 01/19/2020 2030   Sepsis Labs: _0 (procalcitonin:4,lacticidven:4) ) Recent Results (from the past 240 hour(s))  Respiratory Panel by RT PCR (Flu A&B, Covid) - Nasopharyngeal Swab     Status: None   Collection Time: 01/19/20  8:30 PM   Specimen: Nasopharyngeal Swab  Result Value Ref Range Status   SARS Coronavirus 2 by RT PCR NEGATIVE NEGATIVE Final    Comment: (NOTE) SARS-CoV-2 target nucleic acids are NOT DETECTED. The SARS-CoV-2 RNA is generally detectable in upper respiratoy specimens during the acute phase of infection. The lowest concentration of SARS-CoV-2 viral copies this assay can detect is 131 copies/mL. A negative result does not preclude SARS-Cov-2 infection and should not be used as the sole basis for treatment or other patient management decisions. A negative result may occur with  improper specimen collection/handling, submission of specimen other than nasopharyngeal swab, presence of viral mutation(s) within the areas targeted by this assay, and inadequate number of viral copies (<131 copies/mL). A negative result must be combined with clinical observations,  patient history, and epidemiological information. The expected result is Negative. Fact Sheet for Patients:  PinkCheek.be Fact Sheet for Healthcare Providers:  GravelBags.it This test is not yet ap proved or cleared by the [place] FDA and  has been authorized for detection and/or diagnosis of SARS-CoV-2 by FDA under an Emergency Use Authorization (EUA). This EUA will remain  in effect (meaning this test can be used) for the duration of the COVID-19 declaration under Section 564(b)(1) of the Act, 21 U.S.C. section 360bbb-3(b)(1), unless the authorization is terminated or revoked sooner.    Influenza A by PCR  NEGATIVE NEGATIVE Final   Influenza B by PCR NEGATIVE NEGATIVE Final    Comment: (NOTE) The Xpert Xpress SARS-CoV-2/FLU/RSV assay is intended as an aid in  the diagnosis of influenza from Nasopharyngeal swab specimens and  should not be used as a sole basis for treatment. Nasal washings and  aspirates are unacceptable for Xpert Xpress SARS-CoV-2/FLU/RSV  testing. Fact Sheet for Patients: PinkCheek.be Fact Sheet for Healthcare Providers: GravelBags.it This test is not yet approved or cleared by the [place] FDA and  has been authorized for detection and/or diagnosis of SARS-CoV-2 by  FDA under an Emergency Use Authorization (EUA). This EUA will remain  in effect (meaning this test can be used) for the duration of the  Covid-19 declaration under Section 564(b)(1) of the Act, 21  U.S.C. section 360bbb-3(b)(1), unless the authorization is  terminated or revoked. Performed at [facility], [address], [place], [place] [address]      Scheduled Meds: .  stroke: mapping our early stages of recovery book   Does not apply Once  . cholecalciferol  5,000 Units Oral Daily  . docusate sodium  100 mg Oral QHS  . DULoxetine  60 mg Oral Daily  . enoxaparin (LOVENOX) injection  40 mg Subcutaneous QHS  . escitalopram  20 mg Oral QHS  . fluticasone  2 spray Each Nare Daily  . fluticasone furoate-vilanterol  1 puff Inhalation Daily  . levothyroxine  150 mcg Oral Q0600  . loratadine  10 mg Oral Daily  . magnesium oxide  400 mg Oral Daily  . methylPREDNISolone  4 mg Oral BID  . montelukast  10 mg Oral QHS  . pantoprazole  40 mg Oral Daily  . psyllium  1 packet Oral TID  . umeclidinium bromide  1 puff Inhalation Daily   Continuous Infusions:  Procedures/Studies: CT HEAD CODE STROKE WO CONTRAST  Result Date: 01/19/2020 CLINICAL DATA:  Code stroke. Right-sided weakness. Headache and diplopia. EXAM: CT HEAD WITHOUT CONTRAST  TECHNIQUE: Contiguous axial images were obtained from the base of the skull through the vertex without intravenous contrast. COMPARISON:  None. FINDINGS: Brain: There is no mass, hemorrhage or extra-axial collection. The size and configuration of the ventricles and extra-axial CSF spaces are normal. The brain parenchyma is normal, without evidence of acute or chronic infarction. Vascular: No abnormal hyperdensity of the major intracranial arteries or dural venous sinuses. No intracranial atherosclerosis. Skull: The visualized skull base, calvarium and extracranial soft tissues are normal. Sinuses/Orbits: No fluid levels or advanced mucosal thickening of the visualized paranasal sinuses. No mastoid or middle ear effusion. The orbits are normal. ASPECTS [facility] Stroke Program Early CT Score) - Ganglionic level infarction (caudate, lentiform nuclei, internal capsule, insula, M1-M3 cortex): 7 - Supraganglionic infarction (M4-M6 cortex): 3 Total score (0-10 with 10 being normal): 10 IMPRESSION: 1. Normal head CT. 2. ASPECTS is 10. * These results were called by telephone at the  time of interpretation on 01/19/2020 at 8:00 pm to provider Fredia Sorrow , who verbally acknowledged these results. Electronically Signed   By: Ulyses Jarred M.D.   On: 01/19/2020 20:01    Orson Eva, DO  [facility]  If 7PM-7AM, please contact night-coverage [email or web address] Password [email or web address] 01/20/2020, 6:54 AM   LOS: 0 days

## 2020-01-20 NOTE — Progress Notes (Signed)
EEG Completed; Results Pending  

## 2020-01-20 NOTE — Evaluation (Signed)
Occupational Therapy Evaluation Patient Details Name: Heather Fry MRN: [id number] DOB: November 16, 1975 Today's Date: 01/20/2020    History of Present Illness Heather Fry is a 44 y.o. female with medical history significant of arthritis, anxiety, depression, GERD, Graves' disease who presented to the ER with right-sided numbness.  Patient has apparently been having twitching around the eye as well as occasional numbness to the right side of the face.  This has been ongoing for the past several months.  She was initially thought to have possible lupus versus some current rheumatologic condition and was referred to a rheumatologist.  She says her ANA was positive but other work-up had been negative.  She had been placed on methylprednisolone with no significant improvement in her symptoms.  She was then referred by her primary care provider to a neurologist for an evaluation for possible MS but has not been seen by them yet.  At around 3:45 PM today she had acute onset of right-sided numbness where the whole right side of her body went numb and she also has some associated mild bilateral lower extremity weakness.  She also reported a mild headache at the time.  Patient arrived to the ER around 4 hours after onset of symptoms.  She was seen by the teleneurology service who did not believe that she was having an acute CVA but rather her symptoms suggested possible demyelination syndrome, possibly MS.  At the time of my evaluation, the patient's lower extremity weakness had resolved but she still reported having some numbness over the right hand as well as the right side of the face.   Clinical Impression   Pt agreeable to OT/PT co-evaluation this am. Pt reports new symptoms have resolved with exception of tingling in her right fingers. Upon sitting at EOB pt experiencing numbness/tingling in tops of feet. Pt with BUE strength WFL, coordination and sensation are intact. Pt is performing ADLs at baseline,  has family available 24/7 to assist as needed. No further OT services required at this time as pt is at her functional baseline with ADLs.     Follow Up Recommendations  No OT follow up    Equipment Recommendations  None recommended by OT       Precautions / Restrictions Precautions Precautions: Fall Restrictions Weight Bearing Restrictions: No      Mobility Bed Mobility Overal bed mobility: Modified Independent             General bed mobility comments: to transition to seated EOB  Transfers Overall transfer level: Modified independent Equipment used: Rolling walker (2 wheeled)             General transfer comment: transfer to and from standing with RW        ADL either performed or assessed with clinical judgement   ADL Overall ADL's : Needs assistance/impaired     Grooming: Supervision/safety;Standing                   Toilet Transfer: Supervision/safety;Ambulation;RW   Toileting- Clothing Manipulation and Hygiene: Supervision/safety;Sitting/lateral lean;Sit to/from stand       Functional mobility during ADLs: Supervision/safety;Rolling walker       Vision Baseline Vision/History: Wears glasses Wears Glasses: At all times Patient Visual Report: No change from baseline Vision Assessment?: No apparent visual deficits Additional Comments: Has Graves disease            Pertinent Vitals/Pain Pain Assessment: No/denies pain(c/o facial numbness, R hand tingling, top of L foot numb)  Hand Dominance Right   Extremity/Trunk Assessment Upper Extremity Assessment Upper Extremity Assessment: Overall WFL for tasks assessed   Lower Extremity Assessment Lower Extremity Assessment: Defer to PT evaluation   Cervical / Trunk Assessment Cervical / Trunk Assessment: Normal   Communication Communication Communication: No difficulties   Cognition Arousal/Alertness: Awake/alert Behavior During Therapy: WFL for tasks  assessed/performed Overall Cognitive Status: Within Functional Limits for tasks assessed                                                Home Living Family/patient expects to be discharged to:: Private residence Living Arrangements: Spouse/significant other;Children Available Help at Discharge: Family;Available 24 hours/day Type of Home: Mobile home Home Access: Stairs to enter Entrance Stairs-Number of Steps: 5-6 Entrance Stairs-Rails: None Home Layout: One level     Bathroom Shower/Tub: Teacher, early years/pre: Standard Bathroom Accessibility: Yes   Home Equipment: Environmental consultant - 2 wheels;Wheelchair - manual;Cane - single point;Grab bars - tub/shower          Prior Functioning/Environment Level of Independence: Needs assistance  Gait / Transfers Assistance Needed: Patient is a household ambulator with cane, wheelchair for longer distances out of house ADL's / Homemaking Assistance Needed: family assists with ADLs as needed including dressing, bathing, meal preparation, functional transfers, etc.                      OT Goals(Current goals can be found in the care plan section) Acute Rehab OT Goals Patient Stated Goal: return home with family to assist  OT Frequency:             Co-evaluation PT/OT/SLP Co-Evaluation/Treatment: Yes Reason for Co-Treatment: Complexity of the patient's impairments (multi-system involvement) PT goals addressed during session: Mobility/safety with mobility;Proper use of DME;Balance;Strengthening/ROM OT goals addressed during session: ADL's and self-care;Proper use of Adaptive equipment and DME      AM-PAC OT "6 Clicks" Daily Activity     Outcome Measure Help from another person eating meals?: None Help from another person taking care of personal grooming?: A Little Help from another person toileting, which includes using toliet, bedpan, or urinal?: None Help from another person bathing (including washing,  rinsing, drying)?: A Little Help from another person to put on and taking off regular upper body clothing?: A Little Help from another person to put on and taking off regular lower body clothing?: A Little 6 Click Score: 20   End of Session Equipment Utilized During Treatment: Gait belt;Rolling walker  Activity Tolerance: Patient tolerated treatment well Patient left: in bed;with call bell/phone within reach                   Time: [id number] OT Time Calculation (min): 15 min Charges:  OT General Charges $OT Visit: 1 Visit OT Evaluation $OT Eval Low Complexity: [place], OTR/L  [phone] 01/20/2020, 8:47 AM

## 2020-01-20 NOTE — Progress Notes (Signed)
Pt discharged from hospital with family. Propelled out of the hospital by staff and transferred to family vehicle.

## 2020-01-20 NOTE — Procedures (Signed)
Patient Name: SHELBA ROSO  MRN: [id number]  Epilepsy Attending: Lora Havens  Referring Physician/Provider: Dr. Shanon Brow Tat Date: 01/20/2020 Duration: 23.06 mins  Patient history: 44 year old female with right facial numbness.  EEG evaluate for seizures.  Level of alertness: Awake  AEDs during EEG study: None  Technical aspects: This EEG study was done with scalp electrodes positioned according to the 10-20 International system of electrode placement. Electrical activity was acquired at a sampling rate of 500Hz  and reviewed with a high frequency filter of 70Hz  and a low frequency filter of 1Hz . EEG data were recorded continuously and digitally stored.   Description: The posterior dominant rhythm consists of 10 Hz activity of moderate voltage (25-35 uV) seen predominantly in posterior head regions, symmetric and reactive to eye opening and eye closing. Physiologic photic driving was seen during photic stimulation.  No EEG change was seen during hyperventilation.  IMPRESSION: This study is within normal limits. No seizures or epileptiform discharges were seen throughout the recording.  Joclynn Lumb Barbra Sarks

## 2020-01-20 NOTE — Plan of Care (Signed)
  Problem: Acute Rehab PT Goals(only PT should resolve) Goal: Pt Will Transfer Bed To Chair/Chair To Bed Outcome: Progressing Flowsheets (Taken 01/20/2020 0848) Pt will Transfer Bed to Chair/Chair to Bed: with modified independence Goal: Pt Will Ambulate Outcome: Progressing Flowsheets (Taken 01/20/2020 0848) Pt will Ambulate:  25 feet  with modified independence  with least restrictive assistive device Goal: Pt/caregiver will Perform Home Exercise Program Outcome: Progressing Flowsheets (Taken 01/20/2020 0848) Pt/caregiver will Perform Home Exercise Program:  For increased strengthening  For improved balance  Independently  8:48 AM, 01/20/20 Mearl Latin PT, DPT Physical Therapist at [facility]

## 2020-01-20 NOTE — Discharge Summary (Signed)
Physician Discharge Summary  Heather Fry PTW:[id number] DOB: August 09, 1976 DOA: 01/19/2020  PCP: Redmond School, MD  Admit date: 01/19/2020 Discharge date: 01/20/2020  Admitted From: Home Disposition:  Home   Recommendations for Outpatient Follow-up:  1. Follow up with PCP in 1-2 weeks 2. Please obtain BMP/CBC in one week     Discharge Condition: Stable CODE STATUS: FULL Diet recommendation: Heart Healthy   Brief/Interim Summary: 44 year old female with a history of hypothyroidism, GERD, anxiety/depression, B12 deficiency, OSA, asthma presenting with approximately 43-monthhistory of right facial numbness from her forehead to her chin, but primarily located around the periorbital area.  She denies any tearing states that she has occasional twitching of her right eye and periorbital area.  She states the sensations last approximately 5 minutes and occurs every day.  In addition, the patient has been having bilateral hand numbness and tingling, right greater than left hand.  She also complains of bilateral numbness and tingling in her right greater than left foot.  Occasionally her sensory disturbance migrates proximally to her knees or elbows.  She has an occasional headache with the symptoms but denies any visual loss or focal extremity weakness.  However, the patient experienced some leg weakness and a "noodle-like sensation" of her bilateral legs in the afternoon on 01/19/2020.  As result, she presented for further evaluation.  Notably, the patient states that her rheumatologist placed her on methylprednisolone 4 mg twice daily which she has been taking for the last 2 months.  She stated that this was started secondary to an elevated sedimentation rate as well as swelling and numbness in her hands.  The patient has had a 3-pack-year history of tobacco quitting 2 years ago.  She denies any alcohol or illegal drug use.  The patient also describes some intermittent chest discomfort that is  sharp in nature substernal lasting a few seconds.  She has not had any worsening shortness of breath, coughing, hemoptysis, nausea, vomiting, diarrhea, abdominal pain, dysuria.  She denies any syncope, presyncope, or memory loss. In the emergency department, the patient was worked up as a code stroke.  CT of the brain was negative.  BMP, LFTs, and CBC were essentially unremarkable.  The patient was afebrile hemodynamically stable.  She was admitted for further neurologic work up  Discharge Diagnoses:   Sensory disturbance -Etiology unclear, but concerns for trigeminal neuralgia, peripheral neuropathy, and possible demyelination syndrome -Serum B12--851 -TSH 2.374 -12/20/2019 hemoglobin AZ0Y6.1 -Folic aFVCB--44.9-UA negative for pyuria -Urine drug screen--neg -RPR--neg -MRI brain--neg -MRI cervical spine--Multiple cervical disc herniations, most notable at C6-7 where there is moderate right-sided spinal stenosis and mild cord flattening. No spinal cord signal abnormality -MR T and L spine--Mild thoracic and lumbar spondylosis without evidence of neural impingement. -Check iron studies--iron sat 20%, ferritin 40 -neurology consult appreciated-->symptoms likely related to thyroid disease, consider gabapentin; follow up office; cleared for dc -EEG--neg for seizure -ESR--36 -CRP--1.7 -CK 46 -d/c home with gabapentin  Asthma, type unspecified -Stable on room air -Continue Breo and Incruse and singulair  Morbid obesity -BMI 45.64 -Lifestyle modification  OSA -Continue CPAP  Hypothyroidism -TSH 2.374 -Continue levothyroxine  Depression/anxiety -Continue Lexapro and Cymbalta  Impaired Glucose Tolerance -lifestyle modification  Atypical Chest pain -personally reviewed EKG--sinus, no STT changes -cycle troponin--neg -CXR-neg   Discharge Instructions  Discharge Instructions    Ambulatory referral to Physical Therapy   Complete by: As directed      Allergies as of  01/20/2020      Reactions  Levofloxacin Other (See Comments)   Pt can not have due to taking Lexapro    Toradol [ketorolac Tromethamine] Anaphylaxis, Hives   Penicillins Other (See Comments)   Loopy, childhood allergy      Medication List    TAKE these medications   acetaminophen 500 MG tablet Commonly known as: TYLENOL Take 1,000 mg by mouth daily as needed for moderate pain.   albuterol 108 (90 Base) MCG/ACT inhaler Commonly known as: VENTOLIN HFA Inhale 2 puffs into the lungs every 6 (six) hours as needed.   BENEFIBER DRINK MIX PO Take 1 Dose by mouth 3 (three) times daily.   budesonide-formoterol 160-4.5 MCG/ACT inhaler Commonly known as: Symbicort Inhale 2 puffs into the lungs in the morning and at bedtime.   cetirizine 10 MG tablet Commonly known as: ZyrTEC Allergy Take 1 tablet (10 mg total) by mouth daily. What changed: when to take this   docusate sodium 100 MG capsule Commonly known as: COLACE Take 100 mg by mouth at bedtime.   escitalopram 20 MG tablet Commonly known as: LEXAPRO Take 1 tablet (20 mg total) by mouth at bedtime.   fluticasone 50 MCG/ACT nasal spray Commonly known as: FLONASE Place 2 sprays into both nostrils daily. What changed: when to take this   Magnesium 250 MG Tabs Take 250 mg by mouth at bedtime.   methylPREDNISolone 4 MG tablet Commonly known as: MEDROL Take 4 mg by mouth in the morning and at bedtime.   montelukast 10 MG tablet Commonly known as: SINGULAIR Take 1 tablet (10 mg total) by mouth at bedtime.   NON FORMULARY at bedtime. CPAP at night   omeprazole 20 MG capsule Commonly known as: PRILOSEC Take 20 mg by mouth 2 (two) times daily before a meal.   Phazyme 125 MG chewable tablet Generic drug: simethicone Chew 125-250 mg by mouth every 6 (six) hours as needed for flatulence.   prednisoLONE acetate 1 % ophthalmic suspension Commonly known as: PRED FORTE Place 1 drop into both eyes daily.   Restasis 0.05 %  ophthalmic emulsion Generic drug: cycloSPORINE Place 1 drop into both eyes 2 (two) times daily.   Spiriva Respimat 1.25 MCG/ACT Aers Generic drug: Tiotropium Bromide Monohydrate Inhale 2 puffs into the lungs daily.   sucralfate 1 g tablet Commonly known as: CARAFATE Take 1 g by mouth 4 (four) times daily.   Synthroid 150 MCG tablet Generic drug: levothyroxine Take 1 tablet (150 mcg total) by mouth daily before breakfast.   vitamin B-12 1000 MCG tablet Commonly known as: CYANOCOBALAMIN Take 1,000 mcg by mouth at bedtime.   Vitamin D3 125 MCG (5000 UT) Caps Take 1 capsule (5,000 Units total) by mouth daily. What changed:   how much to take  when to take this       Allergies  Allergen Reactions  . Levofloxacin Other (See Comments)    Pt can not have due to taking Lexapro   . Toradol [Ketorolac Tromethamine] Anaphylaxis and Hives  . Penicillins Other (See Comments)    Loopy, childhood allergy      Consultations:  neurology   Procedures/Studies: DG Chest 2 View  Result Date: 01/20/2020 CLINICAL DATA:  Chest pain.  Negative COVID-19 test. EXAM: CHEST - 2 VIEW COMPARISON:  December 04, 2019 FINDINGS: The heart size is borderline to mildly enlarged. The hila and mediastinum are normal. No pneumothorax. No nodules or masses. No focal infiltrates. IMPRESSION: No active cardiopulmonary disease. Electronically Signed   By: Dorise Bullion III M.D  On: 01/20/2020 12:51   MR BRAIN W WO CONTRAST  Result Date: 01/20/2020 CLINICAL DATA:  Right facial numbness for 3 months with occasional right eye twitching. Numbness and tingling in the right greater than left hands and feet. Leg weakness. EXAM: MRI HEAD WITHOUT AND WITH CONTRAST TECHNIQUE: Multiplanar, multiecho pulse sequences of the brain and surrounding structures were obtained without and with intravenous contrast. CONTRAST:  65m GADAVIST GADOBUTROL 1 MMOL/ML IV SOLN COMPARISON:  Head CT 01/19/2020 FINDINGS: Brain: There is no  evidence of acute infarct, intracranial hemorrhage, mass, midline shift, or extra-axial fluid collection. The ventricles and sulci are normal. The brain is normal in signal. No abnormal enhancement is identified. Vascular: Major intracranial vascular flow voids are preserved. Skull and upper cervical spine: No suspicious marrow lesion. Sinuses/Orbits: Unremarkable orbits. Paranasal sinuses and mastoid air cells are clear. Other: None. IMPRESSION: Negative brain MRI. Electronically Signed   By: ALogan BoresM.D.   On: 01/20/2020 12:36   MR CERVICAL SPINE W WO CONTRAST  Result Date: 01/20/2020 CLINICAL DATA:  Right facial numbness for 3 months with occasional right eye twitching. Numbness and tingling in the right greater than left hands and feet. Leg weakness. EXAM: MRI CERVICAL, THORACIC AND LUMBAR SPINE WITHOUT AND WITH CONTRAST TECHNIQUE: Multiplanar and multiecho pulse sequences of the cervical spine, to include the craniocervical junction and cervicothoracic junction, and thoracic and lumbar spine, were obtained without and with intravenous contrast. COMPARISON:  None. CONTRAST:  10 mL Gadavist FINDINGS: MRI CERVICAL SPINE FINDINGS Alignment: Cervical spine straightening. No listhesis. Vertebrae: No fracture, suspicious osseous lesion, or significant marrow edema. Cord: Normal signal. No abnormal intradural enhancement. Posterior Fossa, vertebral arteries, paraspinal tissues: Unremarkable. Disc levels: C2-3 and C3-4: Negative. C4-5: A small central/right central disc protrusion results in a mild focal indentation of the ventral spinal cord and borderline spinal stenosis. Patent neural foramina. C5-6: A small central disc extrusion with slight caudal migration mildly flattens the ventral spinal cord with borderline to mild spinal stenosis. Patent neural foramina. C6-7: A moderate-sized right paracentral disc extrusion results in moderate right-sided spinal stenosis with mild cord flattening. Patent neural  foramina. C7-T1: Negative. MRI THORACIC SPINE FINDINGS Alignment:  Normal. Vertebrae: No fracture, suspicious osseous lesion, or significant marrow edema. Cord: Normal signal and morphology. No abnormal intradural enhancement. Paraspinal and other soft tissues: 1 cm cystic focus to the right of the T4-5 disc space, benign in appearance. Disc levels: Mild thoracic spondylosis with mild anterior vertebral spurring throughout the mid to lower thoracic spine. No disc herniation, spinal stenosis, or neural foraminal stenosis. MRI LUMBAR SPINE FINDINGS Segmentation:  Standard. Alignment: Normal. Vertebrae: No fracture, suspicious osseous lesion, or significant marrow edema. Conus medullaris and cauda equina: Conus extends to the T12-L1 level. Conus and cauda equina appear normal. Paraspinal and other soft tissues: Partially visualized right renal cyst as seen on a 04/07/2019 abdominal CT. Disc levels: L1-2: Negative. L2-3: Small left foraminal and extraforaminal disc protrusion without stenosis. L3-4: Negative. L4-5: Mild disc desiccation. Mild disc bulging results in mild right greater than left neural foraminal narrowing without spinal stenosis. L5-S1: Negative. IMPRESSION: 1. 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 2. No spinal cord signal abnormality. 3. Mild thoracic and lumbar spondylosis without evidence of neural impingement. Electronically Signed   By: ALogan BoresM.D.   On: 01/20/2020 12:57   MR THORACIC SPINE W WO CONTRAST  Result Date: 01/20/2020 CLINICAL DATA:  Right facial  numbness for 3 months with occasional right eye twitching. Numbness and tingling in the right greater than left hands and feet. Leg weakness. EXAM: MRI CERVICAL, THORACIC AND LUMBAR SPINE WITHOUT AND WITH CONTRAST TECHNIQUE: Multiplanar and multiecho pulse sequences of the cervical spine, to include the craniocervical junction and  cervicothoracic junction, and thoracic and lumbar spine, were obtained without and with intravenous contrast. COMPARISON:  None. CONTRAST:  10 mL Gadavist FINDINGS: MRI CERVICAL SPINE FINDINGS Alignment: Cervical spine straightening. No listhesis. Vertebrae: No fracture, suspicious osseous lesion, or significant marrow edema. Cord: Normal signal. No abnormal intradural enhancement. Posterior Fossa, vertebral arteries, paraspinal tissues: Unremarkable. Disc levels: C2-3 and C3-4: Negative. C4-5: A small central/right central disc protrusion results in a mild focal indentation of the ventral spinal cord and borderline spinal stenosis. Patent neural foramina. C5-6: A small central disc extrusion with slight caudal migration mildly flattens the ventral spinal cord with borderline to mild spinal stenosis. Patent neural foramina. C6-7: A moderate-sized right paracentral disc extrusion results in moderate right-sided spinal stenosis with mild cord flattening. Patent neural foramina. C7-T1: Negative. MRI THORACIC SPINE FINDINGS Alignment:  Normal. Vertebrae: No fracture, suspicious osseous lesion, or significant marrow edema. Cord: Normal signal and morphology. No abnormal intradural enhancement. Paraspinal and other soft tissues: 1 cm cystic focus to the right of the T4-5 disc space, benign in appearance. Disc levels: Mild thoracic spondylosis with mild anterior vertebral spurring throughout the mid to lower thoracic spine. No disc herniation, spinal stenosis, or neural foraminal stenosis. MRI LUMBAR SPINE FINDINGS Segmentation:  Standard. Alignment: Normal. Vertebrae: No fracture, suspicious osseous lesion, or significant marrow edema. Conus medullaris and cauda equina: Conus extends to the T12-L1 level. Conus and cauda equina appear normal. Paraspinal and other soft tissues: Partially visualized right renal cyst as seen on a 04/07/2019 abdominal CT. Disc levels: L1-2: Negative. L2-3: Small left foraminal and  extraforaminal disc protrusion without stenosis. L3-4: Negative. L4-5: Mild disc desiccation. Mild disc bulging results in mild right greater than left neural foraminal narrowing without spinal stenosis. L5-S1: Negative. IMPRESSION: 1. 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 2. No spinal cord signal abnormality. 3. Mild thoracic and lumbar spondylosis without evidence of neural impingement. Electronically Signed   By: Logan Bores M.D.   On: 01/20/2020 12:57   MR Lumbar Spine W Wo Contrast  Result Date: 01/20/2020 CLINICAL DATA:  Right facial numbness for 3 months with occasional right eye twitching. Numbness and tingling in the right greater than left hands and feet. Leg weakness. EXAM: MRI CERVICAL, THORACIC AND LUMBAR SPINE WITHOUT AND WITH CONTRAST TECHNIQUE: Multiplanar and multiecho pulse sequences of the cervical spine, to include the craniocervical junction and cervicothoracic junction, and thoracic and lumbar spine, were obtained without and with intravenous contrast. COMPARISON:  None. CONTRAST:  10 mL Gadavist FINDINGS: MRI CERVICAL SPINE FINDINGS Alignment: Cervical spine straightening. No listhesis. Vertebrae: No fracture, suspicious osseous lesion, or significant marrow edema. Cord: Normal signal. No abnormal intradural enhancement. Posterior Fossa, vertebral arteries, paraspinal tissues: Unremarkable. Disc levels: C2-3 and C3-4: Negative. C4-5: A small central/right central disc protrusion results in a mild focal indentation of the ventral spinal cord and borderline spinal stenosis. Patent neural foramina. C5-6: A small central disc extrusion with slight caudal migration mildly flattens the ventral spinal cord with borderline to mild spinal stenosis. Patent neural foramina. C6-7: A moderate-sized right paracentral disc extrusion results in moderate right-sided spinal stenosis with mild cord flattening.  Patent neural foramina. C7-T1: Negative. MRI THORACIC SPINE FINDINGS Alignment:  Normal. Vertebrae: No fracture, suspicious osseous lesion, or significant marrow edema. Cord: Normal signal and morphology. No abnormal intradural enhancement. Paraspinal and other soft tissues: 1 cm cystic focus to the right of the T4-5 disc space, benign in appearance. Disc levels: Mild thoracic spondylosis with mild anterior vertebral spurring throughout the mid to lower thoracic spine. No disc herniation, spinal stenosis, or neural foraminal stenosis. MRI LUMBAR SPINE FINDINGS Segmentation:  Standard. Alignment: Normal. Vertebrae: No fracture, suspicious osseous lesion, or significant marrow edema. Conus medullaris and cauda equina: Conus extends to the T12-L1 level. Conus and cauda equina appear normal. Paraspinal and other soft tissues: Partially visualized right renal cyst as seen on a 04/07/2019 abdominal CT. Disc levels: L1-2: Negative. L2-3: Small left foraminal and extraforaminal disc protrusion without stenosis. L3-4: Negative. L4-5: Mild disc desiccation. Mild disc bulging results in mild right greater than left neural foraminal narrowing without spinal stenosis. L5-S1: Negative. IMPRESSION: 1. 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 2. No spinal cord signal abnormality. 3. Mild thoracic and lumbar spondylosis without evidence of neural impingement. Electronically Signed   By: Logan Bores M.D.   On: 01/20/2020 12:57   EEG adult  Result Date: 01/20/2020 Lora Havens, MD     01/20/2020  4:47 PM Patient Name: Heather Fry MRN: [id number] Epilepsy Attending: Lora Havens Referring Physician/Provider: Dr. Shanon Brow Da Authement Date: 01/20/2020 Duration: 23.06 mins Patient history: 44 year old female with right facial numbness.  EEG evaluate for seizures. Level of alertness: Awake AEDs during EEG study: None Technical aspects: This  EEG study was done with scalp electrodes positioned according to the 10-20 International system of electrode placement. Electrical activity was acquired at a sampling rate of _0  and reviewed with a high frequency filter of _1  and a low frequency filter of _2 . EEG data were recorded continuously and digitally stored. Description: The posterior dominant rhythm consists of 10 Hz activity of moderate voltage (25-35 uV) seen predominantly in posterior head regions, symmetric and reactive to eye opening and eye closing. Physiologic photic driving was seen during photic stimulation.  No EEG change was seen during hyperventilation. IMPRESSION: This study is within normal limits. No seizures or epileptiform discharges were seen throughout the recording. Lora Havens   CT HEAD CODE STROKE WO CONTRAST  Result Date: 01/19/2020 CLINICAL DATA:  Code stroke. Right-sided weakness. Headache and diplopia. EXAM: CT HEAD WITHOUT CONTRAST TECHNIQUE: Contiguous axial images were obtained from the base of the skull through the vertex without intravenous contrast. COMPARISON:  None. FINDINGS: Brain: There is no mass, hemorrhage or extra-axial collection. The size and configuration of the ventricles and extra-axial CSF spaces are normal. The brain parenchyma is normal, without evidence of acute or chronic infarction. Vascular: No abnormal hyperdensity of the major intracranial arteries or dural venous sinuses. No intracranial atherosclerosis. Skull: The visualized skull base, calvarium and extracranial soft tissues are normal. Sinuses/Orbits: No fluid levels or advanced mucosal thickening of the visualized paranasal sinuses. No mastoid or middle ear effusion. The orbits are normal. ASPECTS [facility] Stroke Program Early CT Score) - Ganglionic level infarction (caudate, lentiform nuclei, internal capsule, insula, M1-M3 cortex): 7 - Supraganglionic infarction (M4-M6 cortex): 3 Total score (0-10 with 10 being normal): 10  IMPRESSION: 1. Normal head CT. 2. ASPECTS is 10. * These results were called by telephone at the time of interpretation on 01/19/2020 at 8:00 pm  to provider Fredia Sorrow , who verbally acknowledged these results. Electronically Signed   By: Ulyses Jarred M.D.   On: 01/19/2020 20:01         Discharge Exam: Vitals:   01/20/20 1405 01/20/20 1808  BP: (!) 156/98 (!) 139/94  Pulse: 87 82  Resp: 20 19  Temp: 98.5 F (36.9 C) 98.7 F (37.1 C)  SpO2: 99% 97%   Vitals:   01/20/20 0804 01/20/20 0834 01/20/20 1405 01/20/20 1808  BP:  134/78 (!) 156/98 (!) 139/94  Pulse:  74 87 82  Resp:  _0 Temp:  98.6 F (37 C) 98.5 F (36.9 C) 98.7 F (37.1 C)  TempSrc:  Oral Oral Oral  SpO2: 98% 98% 99% 97%  Weight:      Height:        General: Pt is alert, awake, not in acute distress Cardiovascular: RRR, S1/S2 +, no rubs, no gallops Respiratory: CTA bilaterally, no wheezing, no rhonchi Abdominal: Soft, NT, ND, bowel sounds + Extremities: no edema, no cyanosis   The results of significant diagnostics from this hospitalization (including imaging, microbiology, ancillary and laboratory) are listed below for reference.    Significant Diagnostic Studies: DG Chest 2 View  Result Date: 01/20/2020 CLINICAL DATA:  Chest pain.  Negative COVID-19 test. EXAM: CHEST - 2 VIEW COMPARISON:  December 04, 2019 FINDINGS: The heart size is borderline to mildly enlarged. The hila and mediastinum are normal. No pneumothorax. No nodules or masses. No focal infiltrates. IMPRESSION: No active cardiopulmonary disease. Electronically Signed   By: Dorise Bullion III M.D   On: 01/20/2020 12:51   MR BRAIN W WO CONTRAST  Result Date: 01/20/2020 CLINICAL DATA:  Right facial numbness for 3 months with occasional right eye twitching. Numbness and tingling in the right greater than left hands and feet. Leg weakness. EXAM: MRI HEAD WITHOUT AND WITH CONTRAST TECHNIQUE: Multiplanar, multiecho pulse sequences of the brain  and surrounding structures were obtained without and with intravenous contrast. CONTRAST:  29m GADAVIST GADOBUTROL 1 MMOL/ML IV SOLN COMPARISON:  Head CT 01/19/2020 FINDINGS: Brain: There is no evidence of acute infarct, intracranial hemorrhage, mass, midline shift, or extra-axial fluid collection. The ventricles and sulci are normal. The brain is normal in signal. No abnormal enhancement is identified. Vascular: Major intracranial vascular flow voids are preserved. Skull and upper cervical spine: No suspicious marrow lesion. Sinuses/Orbits: Unremarkable orbits. Paranasal sinuses and mastoid air cells are clear. Other: None. IMPRESSION: Negative brain MRI. Electronically Signed   By: ALogan BoresM.D.   On: 01/20/2020 12:36   MR CERVICAL SPINE W WO CONTRAST  Result Date: 01/20/2020 CLINICAL DATA:  Right facial numbness for 3 months with occasional right eye twitching. Numbness and tingling in the right greater than left hands and feet. Leg weakness. EXAM: MRI CERVICAL, THORACIC AND LUMBAR SPINE WITHOUT AND WITH CONTRAST TECHNIQUE: Multiplanar and multiecho pulse sequences of the cervical spine, to include the craniocervical junction and cervicothoracic junction, and thoracic and lumbar spine, were obtained without and with intravenous contrast. COMPARISON:  None. CONTRAST:  10 mL Gadavist FINDINGS: MRI CERVICAL SPINE FINDINGS Alignment: Cervical spine straightening. No listhesis. Vertebrae: No fracture, suspicious osseous lesion, or significant marrow edema. Cord: Normal signal. No abnormal intradural enhancement. Posterior Fossa, vertebral arteries, paraspinal tissues: Unremarkable. Disc levels: C2-3 and C3-4: Negative. C4-5: A small central/right central disc protrusion results in a mild focal indentation of the ventral spinal cord and borderline spinal stenosis. Patent neural foramina. C5-6: A small central disc  extrusion with slight caudal migration mildly flattens the ventral spinal cord with borderline to  mild spinal stenosis. Patent neural foramina. C6-7: A moderate-sized right paracentral disc extrusion results in moderate right-sided spinal stenosis with mild cord flattening. Patent neural foramina. C7-T1: Negative. MRI THORACIC SPINE FINDINGS Alignment:  Normal. Vertebrae: No fracture, suspicious osseous lesion, or significant marrow edema. Cord: Normal signal and morphology. No abnormal intradural enhancement. Paraspinal and other soft tissues: 1 cm cystic focus to the right of the T4-5 disc space, benign in appearance. Disc levels: Mild thoracic spondylosis with mild anterior vertebral spurring throughout the mid to lower thoracic spine. No disc herniation, spinal stenosis, or neural foraminal stenosis. MRI LUMBAR SPINE FINDINGS Segmentation:  Standard. Alignment: Normal. Vertebrae: No fracture, suspicious osseous lesion, or significant marrow edema. Conus medullaris and cauda equina: Conus extends to the T12-L1 level. Conus and cauda equina appear normal. Paraspinal and other soft tissues: Partially visualized right renal cyst as seen on a 04/07/2019 abdominal CT. Disc levels: L1-2: Negative. L2-3: Small left foraminal and extraforaminal disc protrusion without stenosis. L3-4: Negative. L4-5: Mild disc desiccation. Mild disc bulging results in mild right greater than left neural foraminal narrowing without spinal stenosis. L5-S1: Negative. IMPRESSION: 1. 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 2. No spinal cord signal abnormality. 3. Mild thoracic and lumbar spondylosis without evidence of neural impingement. Electronically Signed   By: Logan Bores M.D.   On: 01/20/2020 12:57   MR THORACIC SPINE W WO CONTRAST  Result Date: 01/20/2020 CLINICAL DATA:  Right facial numbness for 3 months with occasional right eye twitching. Numbness and tingling in the right greater than left hands and feet. Leg weakness. EXAM: MRI  CERVICAL, THORACIC AND LUMBAR SPINE WITHOUT AND WITH CONTRAST TECHNIQUE: Multiplanar and multiecho pulse sequences of the cervical spine, to include the craniocervical junction and cervicothoracic junction, and thoracic and lumbar spine, were obtained without and with intravenous contrast. COMPARISON:  None. CONTRAST:  10 mL Gadavist FINDINGS: MRI CERVICAL SPINE FINDINGS Alignment: Cervical spine straightening. No listhesis. Vertebrae: No fracture, suspicious osseous lesion, or significant marrow edema. Cord: Normal signal. No abnormal intradural enhancement. Posterior Fossa, vertebral arteries, paraspinal tissues: Unremarkable. Disc levels: C2-3 and C3-4: Negative. C4-5: A small central/right central disc protrusion results in a mild focal indentation of the ventral spinal cord and borderline spinal stenosis. Patent neural foramina. C5-6: A small central disc extrusion with slight caudal migration mildly flattens the ventral spinal cord with borderline to mild spinal stenosis. Patent neural foramina. C6-7: A moderate-sized right paracentral disc extrusion results in moderate right-sided spinal stenosis with mild cord flattening. Patent neural foramina. C7-T1: Negative. MRI THORACIC SPINE FINDINGS Alignment:  Normal. Vertebrae: No fracture, suspicious osseous lesion, or significant marrow edema. Cord: Normal signal and morphology. No abnormal intradural enhancement. Paraspinal and other soft tissues: 1 cm cystic focus to the right of the T4-5 disc space, benign in appearance. Disc levels: Mild thoracic spondylosis with mild anterior vertebral spurring throughout the mid to lower thoracic spine. No disc herniation, spinal stenosis, or neural foraminal stenosis. MRI LUMBAR SPINE FINDINGS Segmentation:  Standard. Alignment: Normal. Vertebrae: No fracture, suspicious osseous lesion, or significant marrow edema. Conus medullaris and cauda equina: Conus extends to the T12-L1 level. Conus and cauda equina appear normal.  Paraspinal and other soft tissues: Partially visualized right renal cyst as seen on a 04/07/2019 abdominal CT. Disc levels: L1-2: Negative. L2-3: Small left foraminal and extraforaminal disc protrusion without  stenosis. L3-4: Negative. L4-5: Mild disc desiccation. Mild disc bulging results in mild right greater than left neural foraminal narrowing without spinal stenosis. L5-S1: Negative. IMPRESSION: 1. 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 2. No spinal cord signal abnormality. 3. Mild thoracic and lumbar spondylosis without evidence of neural impingement. Electronically Signed   By: Logan Bores M.D.   On: 01/20/2020 12:57   MR Lumbar Spine W Wo Contrast  Result Date: 01/20/2020 CLINICAL DATA:  Right facial numbness for 3 months with occasional right eye twitching. Numbness and tingling in the right greater than left hands and feet. Leg weakness. EXAM: MRI CERVICAL, THORACIC AND LUMBAR SPINE WITHOUT AND WITH CONTRAST TECHNIQUE: Multiplanar and multiecho pulse sequences of the cervical spine, to include the craniocervical junction and cervicothoracic junction, and thoracic and lumbar spine, were obtained without and with intravenous contrast. COMPARISON:  None. CONTRAST:  10 mL Gadavist FINDINGS: MRI CERVICAL SPINE FINDINGS Alignment: Cervical spine straightening. No listhesis. Vertebrae: No fracture, suspicious osseous lesion, or significant marrow edema. Cord: Normal signal. No abnormal intradural enhancement. Posterior Fossa, vertebral arteries, paraspinal tissues: Unremarkable. Disc levels: C2-3 and C3-4: Negative. C4-5: A small central/right central disc protrusion results in a mild focal indentation of the ventral spinal cord and borderline spinal stenosis. Patent neural foramina. C5-6: A small central disc extrusion with slight caudal migration mildly flattens the ventral spinal cord with borderline to mild  spinal stenosis. Patent neural foramina. C6-7: A moderate-sized right paracentral disc extrusion results in moderate right-sided spinal stenosis with mild cord flattening. Patent neural foramina. C7-T1: Negative. MRI THORACIC SPINE FINDINGS Alignment:  Normal. Vertebrae: No fracture, suspicious osseous lesion, or significant marrow edema. Cord: Normal signal and morphology. No abnormal intradural enhancement. Paraspinal and other soft tissues: 1 cm cystic focus to the right of the T4-5 disc space, benign in appearance. Disc levels: Mild thoracic spondylosis with mild anterior vertebral spurring throughout the mid to lower thoracic spine. No disc herniation, spinal stenosis, or neural foraminal stenosis. MRI LUMBAR SPINE FINDINGS Segmentation:  Standard. Alignment: Normal. Vertebrae: No fracture, suspicious osseous lesion, or significant marrow edema. Conus medullaris and cauda equina: Conus extends to the T12-L1 level. Conus and cauda equina appear normal. Paraspinal and other soft tissues: Partially visualized right renal cyst as seen on a 04/07/2019 abdominal CT. Disc levels: L1-2: Negative. L2-3: Small left foraminal and extraforaminal disc protrusion without stenosis. L3-4: Negative. L4-5: Mild disc desiccation. Mild disc bulging results in mild right greater than left neural foraminal narrowing without spinal stenosis. L5-S1: Negative. IMPRESSION: 1. 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 2. No spinal cord signal abnormality. 3. Mild thoracic and lumbar spondylosis without evidence of neural impingement. Electronically Signed   By: Logan Bores M.D.   On: 01/20/2020 12:57   EEG adult  Result Date: 01/20/2020 Lora Havens, MD     01/20/2020  4:47 PM Patient Name: Heather Fry MRN: [id number] Epilepsy Attending: Lora Havens Referring Physician/Provider: Dr. Shanon Brow Nateisha Moyd Date: 01/20/2020 Duration: 23.06 mins  Patient history: 44 year old female with right facial numbness.  EEG evaluate for seizures. Level of alertness: Awake AEDs during EEG study: None Technical aspects: This EEG study was done with scalp electrodes positioned according to the 10-20 International system of electrode placement. Electrical activity was acquired at a sampling rate of _0  and reviewed with a high frequency filter of _1  and  a low frequency filter of _0 . EEG data were recorded continuously and digitally stored. Description: The posterior dominant rhythm consists of 10 Hz activity of moderate voltage (25-35 uV) seen predominantly in posterior head regions, symmetric and reactive to eye opening and eye closing. Physiologic photic driving was seen during photic stimulation.  No EEG change was seen during hyperventilation. IMPRESSION: This study is within normal limits. No seizures or epileptiform discharges were seen throughout the recording. Lora Havens   CT HEAD CODE STROKE WO CONTRAST  Result Date: 01/19/2020 CLINICAL DATA:  Code stroke. Right-sided weakness. Headache and diplopia. EXAM: CT HEAD WITHOUT CONTRAST TECHNIQUE: Contiguous axial images were obtained from the base of the skull through the vertex without intravenous contrast. COMPARISON:  None. FINDINGS: Brain: There is no mass, hemorrhage or extra-axial collection. The size and configuration of the ventricles and extra-axial CSF spaces are normal. The brain parenchyma is normal, without evidence of acute or chronic infarction. Vascular: No abnormal hyperdensity of the major intracranial arteries or dural venous sinuses. No intracranial atherosclerosis. Skull: The visualized skull base, calvarium and extracranial soft tissues are normal. Sinuses/Orbits: No fluid levels or advanced mucosal thickening of the visualized paranasal sinuses. No mastoid or middle ear effusion. The orbits are normal. ASPECTS [facility] Stroke Program Early CT Score) - Ganglionic level infarction  (caudate, lentiform nuclei, internal capsule, insula, M1-M3 cortex): 7 - Supraganglionic infarction (M4-M6 cortex): 3 Total score (0-10 with 10 being normal): 10 IMPRESSION: 1. Normal head CT. 2. ASPECTS is 10. * These results were called by telephone at the time of interpretation on 01/19/2020 at 8:00 pm to provider Fredia Sorrow , who verbally acknowledged these results. Electronically Signed   By: Ulyses Jarred M.D.   On: 01/19/2020 20:01     Microbiology: Recent Results (from the past 240 hour(s))  Respiratory Panel by RT PCR (Flu A&B, Covid) - Nasopharyngeal Swab     Status: None   Collection Time: 01/19/20  8:30 PM   Specimen: Nasopharyngeal Swab  Result Value Ref Range Status   SARS Coronavirus 2 by RT PCR NEGATIVE NEGATIVE Final    Comment: (NOTE) SARS-CoV-2 target nucleic acids are NOT DETECTED. The SARS-CoV-2 RNA is generally detectable in upper respiratoy specimens during the acute phase of infection. The lowest concentration of SARS-CoV-2 viral copies this assay can detect is 131 copies/mL. A negative result does not preclude SARS-Cov-2 infection and should not be used as the sole basis for treatment or other patient management decisions. A negative result may occur with  improper specimen collection/handling, submission of specimen other than nasopharyngeal swab, presence of viral mutation(s) within the areas targeted by this assay, and inadequate number of viral copies (<131 copies/mL). A negative result must be combined with clinical observations, patient history, and epidemiological information. The expected result is Negative. Fact Sheet for Patients:  PinkCheek.be Fact Sheet for Healthcare Providers:  GravelBags.it This test is not yet ap proved or cleared by the [place] FDA and  has been authorized for detection and/or diagnosis of SARS-CoV-2 by FDA under an Emergency Use Authorization (EUA). This EUA  will remain  in effect (meaning this test can be used) for the duration of the COVID-19 declaration under Section 564(b)(1) of the Act, 21 U.S.C. section 360bbb-3(b)(1), unless the authorization is terminated or revoked sooner.    Influenza A by PCR NEGATIVE NEGATIVE Final   Influenza B by PCR NEGATIVE NEGATIVE Final    Comment: (NOTE) The Xpert Xpress SARS-CoV-2/FLU/RSV assay is intended as an aid in  the diagnosis of influenza from Nasopharyngeal swab specimens and  should not be used as a sole basis for treatment. Nasal washings and  aspirates are unacceptable for Xpert Xpress SARS-CoV-2/FLU/RSV  testing. Fact Sheet for Patients: PinkCheek.be Fact Sheet for Healthcare Providers: GravelBags.it This test is not yet approved or cleared by the [place] FDA and  has been authorized for detection and/or diagnosis of SARS-CoV-2 by  FDA under an Emergency Use Authorization (EUA). This EUA will remain  in effect (meaning this test can be used) for the duration of the  Covid-19 declaration under Section 564(b)(1) of the Act, 21  U.S.C. section 360bbb-3(b)(1), unless the authorization is  terminated or revoked. Performed at [facility], [address] [place], [place] [address]      Labs: Basic Metabolic Panel: Recent Labs  Lab 01/19/20 1957 01/19/20 2017 01/20/20 0733  NA 138 138  --   K 3.9 3.9  --   CL 102 101  --   CO2 25  --   --   GLUCOSE 106* 104*  --   BUN 13 12  --   CREATININE 0.76 0.70  --   CALCIUM 9.4  --   --   MG  --   --  2.0   Liver Function Tests: Recent Labs  Lab 01/19/20 1957  AST 17  ALT 24  ALKPHOS 50  BILITOT 0.4  PROT 7.6  ALBUMIN 3.7   No results for input(s): LIPASE, AMYLASE in the last 168 hours. No results for input(s): AMMONIA in the last 168 hours. CBC: Recent Labs  Lab 01/19/20 1957 01/19/20 2017  WBC 10.9*  --   NEUTROABS 8.0*  --   HGB 13.2 13.3  HCT 40.6 39.0    MCV 90.0  --   PLT 268  --    Cardiac Enzymes: Recent Labs  Lab 01/20/20 0733  CKTOTAL 46   BNP: Invalid input(s): POCBNP CBG: Recent Labs  Lab 01/19/20 1955  GLUCAP 101*    Time coordinating discharge:  36 minutes  Signed:  Orson Eva, DO [facility] Pager: [phone] 01/20/2020, 6:19 PM

## 2020-01-20 NOTE — Evaluation (Signed)
Physical Therapy Evaluation Patient Details Name: Heather Fry MRN: [id number] DOB: Jan 30, 1976 Today's Date: 01/20/2020   History of Present Illness  Heather Fry is a 44 y.o. female with medical history significant of arthritis, anxiety, depression, GERD, Graves' disease who presented to the ER with right-sided numbness.  Patient has apparently been having twitching around the eye as well as occasional numbness to the right side of the face.  This has been ongoing for the past several months.  She was initially thought to have possible lupus versus some current rheumatologic condition and was referred to a rheumatologist.  She says her ANA was positive but other work-up had been negative.  She had been placed on methylprednisolone with no significant improvement in her symptoms.  She was then referred by her primary care provider to a neurologist for an evaluation for possible MS but has not been seen by them yet.  At around 3:45 PM today she had acute onset of right-sided numbness where the whole right side of her body went numb and she also has some associated mild bilateral lower extremity weakness.  She also reported a mild headache at the time.  Patient arrived to the ER around 4 hours after onset of symptoms.  She was seen by the teleneurology service who did not believe that she was having an acute CVA but rather her symptoms suggested possible demyelination syndrome, possibly MS.  At the time of my evaluation, the patient's lower extremity weakness had resolved but she still reported having some numbness over the right hand as well as the right side of the face.    Clinical Impression  Patient limited for functional mobility as stated below secondary to BLE weakness, fatigue and poor standing balance. Patient able to complete bed mobility and transfers without assist. Minimal unsteadiness upon standing and patient demonstrates improved balance with use of RW. She ambulates with slow cadence  with use of RW and states this is near her baseline. She requires min G assist for safety and balance while ambulating and is limited by fatigue and impaired activity tolerance. Patient returned to bed at end of session.  Patient will benefit from continued physical therapy in hospital and recommended venue below to increase strength, balance, endurance for safe ADLs and gait.     Follow Up Recommendations Home health PT;Supervision for mobility/OOB    Equipment Recommendations  None recommended by PT    Recommendations for Other Services       Precautions / Restrictions Precautions Precautions: Fall Restrictions Weight Bearing Restrictions: No      Mobility  Bed Mobility Overal bed mobility: Modified Independent             General bed mobility comments: to transition to seated EOB  Transfers Overall transfer level: Modified independent Equipment used: Rolling walker (2 wheeled)             General transfer comment: transfer to and from standing with RW  Ambulation/Gait Ambulation/Gait assistance: Min guard Gait Distance (Feet): 30 Feet Assistive device: Rolling walker (2 wheeled) Gait Pattern/deviations: Step-through pattern;Decreased step length - right;Decreased step length - left;Decreased stride length Gait velocity: decreased   General Gait Details: slow, labored cadence, near baseline, guard for safety and balance  Stairs            Wheelchair Mobility    Modified Rankin (Stroke Patients Only)       Balance Overall balance assessment: Needs assistance Sitting-balance support: Feet supported;No upper extremity supported Sitting balance-Leahy  Scale: Normal Sitting balance - Comments: seated EOB   Standing balance support: Bilateral upper extremity supported;During functional activity Standing balance-Leahy Scale: Fair Standing balance comment: with RW                             Pertinent Vitals/Pain Pain Assessment:  No/denies pain(c/o facial numbness, R hand tingling, top of L foot numb)    Home Living Family/patient expects to be discharged to:: Private residence Living Arrangements: Spouse/significant other;Children Available Help at Discharge: Family Type of Home: Mobile home Home Access: Stairs to enter Entrance Stairs-Rails: None Entrance Stairs-Number of Steps: 5-6 Home Layout: One level Home Equipment: Environmental consultant - 2 wheels;Wheelchair - manual;Cane - single point;Grab bars - tub/shower      Prior Function Level of Independence: Needs assistance   Gait / Transfers Assistance Needed: Patient is a household ambulator with cane, wheelchair for longer distances out of house  ADL's / Homemaking Assistance Needed: Family assists        Journalist, newspaper        Extremity/Trunk Assessment   Upper Extremity Assessment Upper Extremity Assessment: Defer to OT evaluation    Lower Extremity Assessment Lower Extremity Assessment: Generalized weakness    Cervical / Trunk Assessment Cervical / Trunk Assessment: Normal  Communication   Communication: No difficulties  Cognition Arousal/Alertness: Awake/alert Behavior During Therapy: WFL for tasks assessed/performed Overall Cognitive Status: Within Functional Limits for tasks assessed                                        General Comments      Exercises     Assessment/Plan    PT Assessment Patient needs continued PT services  PT Problem List Decreased strength;Decreased activity tolerance;Decreased balance;Decreased mobility       PT Treatment Interventions DME instruction;Balance training;Gait training;Neuromuscular re-education;Stair training;Functional mobility training;Patient/family education;Therapeutic activities;Therapeutic exercise    PT Goals (Current goals can be found in the Care Plan section)  Acute Rehab PT Goals Patient Stated Goal: return home with family to assist PT Goal Formulation: With  patient Time For Goal Achievement: 02/03/20 Potential to Achieve Goals: Good    Frequency Min 3X/week   Barriers to discharge        Co-evaluation PT/OT/SLP Co-Evaluation/Treatment: Yes Reason for Co-Treatment: To address functional/ADL transfers;Complexity of the patient's impairments (multi-system involvement);For patient/therapist safety PT goals addressed during session: Mobility/safety with mobility;Proper use of DME;Balance;Strengthening/ROM         AM-PAC PT "6 Clicks" Mobility  Outcome Measure Help needed turning from your back to your side while in a flat bed without using bedrails?: None Help needed moving from lying on your back to sitting on the side of a flat bed without using bedrails?: None Help needed moving to and from a bed to a chair (including a wheelchair)?: A Little Help needed standing up from a chair using your arms (e.g., wheelchair or bedside chair)?: None Help needed to walk in hospital room?: A Little Help needed climbing 3-5 steps with a railing? : A Lot 6 Click Score: 20    End of Session Equipment Utilized During Treatment: Gait belt Activity Tolerance: Patient tolerated treatment well Patient left: in bed;with call bell/phone within reach Nurse Communication: Mobility status PT Visit Diagnosis: Unsteadiness on feet (R26.81);Other abnormalities of gait and mobility (R26.89);Muscle weakness (generalized) (M62.81);History of falling (Z91.81)  Time: [id number] PT Time Calculation (min) (ACUTE ONLY): 12 min   Charges:   PT Evaluation $PT Eval Moderate Complexity: 1 Mod          8:44 AM, 01/20/20 Mearl Latin PT, DPT Physical Therapist at [facility]

## 2020-01-20 NOTE — Progress Notes (Signed)
Unable to do 1100 assessment and vitals due to away at MRI

## 2020-01-27 ENCOUNTER — Other Ambulatory Visit (HOSPITAL_COMMUNITY)
Admission: RE | Admit: 2020-01-27 | Discharge: 2020-01-27 | Disposition: A | Discharge status: Home / Self Care | Payer: BC Managed Care – PPO | Source: Ambulatory Visit | Attending: Critical Care Medicine | Admitting: Critical Care Medicine

## 2020-01-27 ENCOUNTER — Other Ambulatory Visit: Payer: Self-pay

## 2020-01-30 ENCOUNTER — Other Ambulatory Visit: Payer: Self-pay

## 2020-01-30 ENCOUNTER — Encounter: Payer: Self-pay | Admitting: Physical Therapy

## 2020-01-30 ENCOUNTER — Ambulatory Visit: Payer: BC Managed Care – PPO | Attending: Internal Medicine | Admitting: Physical Therapy

## 2020-01-30 DIAGNOSIS — M6281 Muscle weakness (generalized): Secondary | ICD-10-CM | POA: Insufficient documentation

## 2020-01-30 DIAGNOSIS — R2681 Unsteadiness on feet: Secondary | ICD-10-CM | POA: Insufficient documentation

## 2020-01-30 NOTE — Therapy (Signed)
[facility] [place], [place], [address] Phone: [phone]   Fax:  [phone]  Physical Therapy Evaluation  Patient Details  Name: Heather Fry MRN: [id number] Date of Birth: 1975-10-18 Referring Provider (PT): Orson Eva, MD   Encounter Date: 01/30/2020  PT End of Session - 01/30/20 1909    Visit Number  1    Number of Visits  12    Date for PT Re-Evaluation  03/19/20    Authorization Type  Progress note every 10th visit    PT Start Time  1115    PT Stop Time  1200    PT Time Calculation (min)  45 min    Activity Tolerance  Patient tolerated treatment well    Behavior During Therapy  [facility] for tasks assessed/performed       Past Medical History:  Diagnosis Date  . Anxiety   . Arthritis   . Depression   . Diverticulosis   . GERD (gastroesophageal reflux disease)   . Graves disease   . Internal hemorrhoids   . Thyroid disease   . Uterine fibroid     Past Surgical History:  Procedure Laterality Date  . ABDOMINAL HYSTERECTOMY     Per patient, uterine fibroids.  . ABDOMINAL HYSTERECTOMY    . CHOLECYSTECTOMY N/A 11/06/2014   Procedure: LAPAROSCOPIC CHOLECYSTECTOMY;  Surgeon: Jamesetta So, MD;  Location: [facility];  Service: General;  Laterality: N/A;  . COLONOSCOPY N/A 03/04/2017   Dr. Gala Romney: Hemorrhoids, diverticulosis, benign polyp without adenomatous changes.  Next colonoscopy 10 years  . DILATION AND CURETTAGE OF UTERUS    . ESOPHAGOGASTRODUODENOSCOPY N/A 10/02/2014   Dr. Gala Romney: fundic gland polyps, hiatal hernia  . MOUTH SURGERY     extraction of teeth  . POLYPECTOMY  03/04/2017   Procedure: POLYPECTOMY;  Surgeon: Daneil Dolin, MD;  Location: [facility];  Service: Endoscopy;;  colon  . THYROIDECTOMY N/A 09/30/2018   Procedure: TOTAL THYROIDECTOMY;  Surgeon: Armandina Gemma, MD;  Location: [facility];  Service: General;  Laterality: N/A;  . TUBAL LIGATION      There were no vitals filed for this  visit.   Subjective Assessment - 01/30/20 1906    Subjective  COVID-19 screening performed upon arrival.Patient arrives to physical therapy with right sided numbness that began after her thyroidectomy in November 2020. Patient reported unsteadiness with gait with shaking, tremors, and increased bilateral LE numbness which resulted in further evaluation in the ED. MRI negative with exception of 3 bulging discs. Patient utilizes rollator for community ambulation but uses a cane for home ambulation with support from walls and furniture. Patient reports no falls but has a fear of falling as her legs feel like they may give way. Patient has family support for assistance with ADLs and home tasks. Patient's goals are to decrease numbness, improve strength, improve balance, and improve mobility for home tasks and ADLS.    Pertinent History  Asthma, Anxiety, depression, diverticulitis, graves disease, thyroidectomy    Limitations  Standing;Walking;House hold activities    Diagnostic tests  MRI, X-Ray, CT Scan: negative except 3 bulging discs in cervical spine.    Patient Stated Goals  improve movement, decrease numbness in arm and leg    Currently in Pain?  Yes    Pain Score  6     Pain Location  Generalized    Pain Orientation  Right    Pain Descriptors / Indicators  Numbness    Pain Type  Acute pain  Pain Onset  More than a month ago    Pain Frequency  Constant    Aggravating Factors   walking    Pain Relieving Factors  resting, warm bath    Effect of Pain on Daily Activities  difficult to do ADLs, needs assistance from family.         [facility] PT Assessment - 01/30/20 0001      Assessment   Medical Diagnosis  right sided numbness    Referring Provider (PT)  Orson Eva, MD    Onset Date/Surgical Date  --   November 2020   Hand Dominance  Right    Next MD Visit  n/a    Prior Therapy  no      Precautions   Precautions  Fall      Restrictions   Weight Bearing Restrictions  No       Balance Screen   Has the patient fallen in the past 6 months  No    Has the patient had a decrease in activity level because of a fear of falling?   Yes    Is the patient reluctant to leave their home because of a fear of falling?   Yes      [place]  Private residence    Living Arrangements  Spouse/significant other      Prior Function   Level of Independence  Independent with basic ADLs;Independent with household mobility with device;Needs assistance with homemaking      Sensation   Light Touch  Appears Intact      ROM / Strength   AROM / PROM / Strength  Strength      Strength   Strength Assessment Site  Shoulder;Knee;Hand;Hip    Right/Left Shoulder  Right;Left    Right Shoulder Flexion  3/5    Right Shoulder ABduction  3/5    Right Shoulder Internal Rotation  4/5    Right Shoulder External Rotation  4/5    Left Shoulder Flexion  4-/5    Left Shoulder ABduction  4-/5    Left Shoulder Internal Rotation  4/5    Left Shoulder External Rotation  4/5    Right/Left hand  Right;Left    Right Hand Grip (lbs)  30    Left Hand Grip (lbs)  40    Right/Left Hip  Right;Left    Right Hip Flexion  3/5    Right Hip ABduction  4-/5   in sitting   Right Hip ADduction  4-/5   in sitting   Left Hip Flexion  3+/5    Left Hip ABduction  4-/5   in sitting   Left Hip ADduction  4-/5   in sitting   Right/Left Knee  Right;Left    Right Knee Flexion  3+/5    Right Knee Extension  3+/5    Left Knee Flexion  4-/5    Left Knee Extension  4-/5      Transfers   Five time sit to stand comments   21.78 seconds, modified with UE support    Comments  slow and cautious transitional movements      Ambulation/Gait   Assistive device  Rollator    Gait Pattern  Step-through pattern;Decreased hip/knee flexion - right;Trunk flexed;Wide base of support                Objective measurements completed on examination: See above findings.  PT Education - 01/30/20 1937    Education Details  pillow squeeze, hip abduction isometric, scapular squeeze, putty squeeze    Person(s) Educated  Patient    Methods  Explanation;Demonstration;Handout    Comprehension  Verbalized understanding;Returned demonstration          PT Long Term Goals - 01/30/20 2025      PT LONG TERM GOAL #1   Title  Patient will be independent with HEP    Time  6    Period  Weeks    Status  New      PT LONG TERM GOAL #2   Title  Patient will ambulate 200 feet with rollator and no loss of balance to safely access community.    Time  6    Period  Weeks    Status  New      PT LONG TERM GOAL #3   Title  Patient will demonstrate 4/5 or greater bilateral UE and LE MMT to improve stability during functional tasks.    Time  6    Period  Weeks    Status  New      PT LONG TERM GOAL #4   Title  Patient will improve right grip strength to  40 lbs of force to improve ability to perform fine motor tasks and ADLs.    Time  6    Period  Weeks    Status  New             Plan - 01/30/20 2021    Clinical Impression Statement  Patient is a 44 year old right handed female who presents to physical with right UE and LE numbness and decreased right UE and LE MMT that began in November 2020 after a thyroidectomy. Patient light touch assessment appears intact bilaterally in UE and LEs. Patient's modified 5x sit to stand time of 21.78 seconds categorizes her as a fall risk. Patient noted with cautious transitional movements and gait without rollator. Patient and PT discussed plan of care and HEP to which patient reported understanding. Patient would benefit from skilled physical therapy to address deficits an address patient's goals.    Personal Factors and Comorbidities  Comorbidity 3+    Comorbidities  Asthma, Anxiety, depression, diverticulitis, graves disease, thyroidectomy    Examination-Activity Limitations  Locomotion  Level;Transfers;Stand;Stairs;Caring for Others    Stability/Clinical Decision Making  Evolving/Moderate complexity    Clinical Decision Making  Moderate    Rehab Potential  Fair    PT Frequency  2x / week    PT Duration  6 weeks    PT Treatment/Interventions  Cryotherapy;Electrical Stimulation;Moist Heat;ADLs/Self Care Home Management;Gait training;Stair training;Functional mobility training;Therapeutic activities;Therapeutic exercise;Balance training;Manual techniques;Neuromuscular re-education;Passive range of motion;Patient/family education    PT Next Visit Plan  nustep, UE and LE strengthening to tolerance. Balance activities in sitting and standing.    PT Home Exercise Plan  see patient education section    Consulted and Agree with Plan of Care  Patient       Patient will benefit from skilled therapeutic intervention in order to improve the following deficits and impairments:  Decreased activity tolerance, Decreased balance, Difficulty walking, Pain, Impaired UE functional use, Impaired sensation, Decreased strength  Visit Diagnosis: Muscle weakness (generalized) - Plan: PT plan of care cert/re-cert  Unsteadiness on feet - Plan: PT plan of care cert/re-cert     Problem List Patient Active Problem List   Diagnosis Date Noted  . Sensory disturbance 01/20/2020  . Atypical chest pain  01/20/2020  . Obesity, Class III, BMI 40-49.9 (morbid obesity) ([place]) 01/20/2020  . Weakness 01/19/2020  . Hyperglycemia 10/04/2019  . Myalgia 09/05/2019  . Vitamin B 12 deficiency 07/28/2019  . Right sided numbness 06/29/2019  . Constipation 06/15/2019  . Elevated lipase 06/15/2019  . Hypomagnesemia 03/29/2019  . Leukocytosis 03/29/2019  . Abdominal pain 02/10/2019  . Nausea without vomiting 02/10/2019  . Tingling of face 11/30/2018  . Flushing 11/30/2018  . Vitamin D deficiency 10/06/2018  . GERD (gastroesophageal reflux disease) 01/28/2017  . Rectal bleeding 01/28/2017  . Proctalgia fugax  01/28/2017  . Hypothyroidism 04/14/2016  . Gastric polyp   . RUQ abdominal pain 09/06/2014  . Excessive or frequent menstruation 01/13/2013  . Depression     Gabriela Eves, PT, DPT 01/30/2020, 8:37 PM  [facility] [address] [place], [place], [address] Phone: [phone]   Fax:  [phone]  Name: Heather Fry MRN: [id number] Date of Birth: 03-Feb-1976

## 2020-01-31 ENCOUNTER — Ambulatory Visit: Payer: BC Managed Care – PPO | Admitting: Critical Care Medicine

## 2020-02-01 ENCOUNTER — Ambulatory Visit: Payer: BC Managed Care – PPO | Admitting: Physical Therapy

## 2020-02-01 ENCOUNTER — Other Ambulatory Visit: Payer: Self-pay

## 2020-02-01 ENCOUNTER — Encounter: Payer: Self-pay | Admitting: Physical Therapy

## 2020-02-01 DIAGNOSIS — M6281 Muscle weakness (generalized): Secondary | ICD-10-CM | POA: Diagnosis not present

## 2020-02-01 DIAGNOSIS — R2681 Unsteadiness on feet: Secondary | ICD-10-CM | POA: Diagnosis not present

## 2020-02-01 NOTE — Therapy (Signed)
[facility] [place], [place], [address] Phone: [phone]   Fax:  [phone]  Physical Therapy Treatment  Patient Details  Name: Heather Fry MRN: [id number] Date of Birth: [birth year] Referring Provider (PT): Orson Eva, MD   Encounter Date: 02/01/2020  PT End of Session - 02/01/20 1309    Visit Number  2    Number of Visits  12    Date for PT Re-Evaluation  03/19/20    Authorization Type  Progress note every 10th visit    PT Start Time  1302    PT Stop Time  1348    PT Time Calculation (min)  46 min    Activity Tolerance  Patient tolerated treatment well    Behavior During Therapy  [facility] for tasks assessed/performed       Past Medical History:  Diagnosis Date  . Anxiety   . Arthritis   . Depression   . Diverticulosis   . GERD (gastroesophageal reflux disease)   . Graves disease   . Internal hemorrhoids   . Thyroid disease   . Uterine fibroid     Past Surgical History:  Procedure Laterality Date  . ABDOMINAL HYSTERECTOMY     Per patient, uterine fibroids.  . ABDOMINAL HYSTERECTOMY    . CHOLECYSTECTOMY N/A 11/06/2014   Procedure: LAPAROSCOPIC CHOLECYSTECTOMY;  Surgeon: Jamesetta So, MD;  Location: [facility];  Service: General;  Laterality: N/A;  . COLONOSCOPY N/A 03/04/2017   Dr. Gala Romney: Hemorrhoids, diverticulosis, benign polyp without adenomatous changes.  Next colonoscopy 10 years  . DILATION AND CURETTAGE OF UTERUS    . ESOPHAGOGASTRODUODENOSCOPY N/A 10/02/2014   Dr. Gala Romney: fundic gland polyps, hiatal hernia  . MOUTH SURGERY     extraction of teeth  . POLYPECTOMY  03/04/2017   Procedure: POLYPECTOMY;  Surgeon: Daneil Dolin, MD;  [facility];  Service: Endoscopy;;  colon  . THYROIDECTOMY N/A 09/30/2018   Procedure: TOTAL THYROIDECTOMY;  Surgeon: Armandina Gemma, MD;  Location: [facility];  Service: General;  Laterality: N/A;  . TUBAL LIGATION      There were no vitals filed for this  visit.  Subjective Assessment - 02/01/20 1307    Subjective  COVID-19 screening performed upon arrival. Patient reports feeling really sore today and has been compliant with HEP but did not do them today.    Pertinent History  Asthma, Anxiety, depression, diverticulitis, graves disease, thyroidectomy    Limitations  Standing;Walking;House hold activities    Diagnostic tests  MRI, X-Ray, CT Scan: negative except 3 bulging discs in cervical spine.    Patient Stated Goals  improve movement, decrease numbness in arm and leg    Currently in Pain?  Yes    Pain Score  7     Pain Location  Generalized    Pain Orientation  Right;Left    Pain Descriptors / Indicators  Sore    Pain Type  Acute pain    Pain Onset  More than a month ago    Pain Frequency  Constant         [facility] PT Assessment - 02/01/20 0001      Assessment   Medical Diagnosis  right sided numbness    Referring Provider (PT)  Orson Eva, MD    Hand Dominance  Right    Next MD Visit  n/a    Prior Therapy  no      Precautions   Precautions  Fall  [facility] Adult PT Treatment/Exercise - 02/01/20 0001      Exercises   Exercises  Knee/Hip;Shoulder      Knee/Hip Exercises: Aerobic   Nustep  Level 1 x 10 mins, UE and LE      Knee/Hip Exercises: Seated   Long Arc Quad  AROM;Both;2 sets;10 reps    Hamstring Curl  2 sets;10 reps;Both    Sit to Sand  1 set;10 reps;without UE support   elevated surface     Shoulder Exercises: Seated   Row  Strengthening;Both;20 reps;Theraband    Theraband Level (Shoulder Row)  Level 1 (Yellow)    Horizontal ABduction  AROM;Both;20 reps;Theraband    Theraband Level (Shoulder Horizontal ABduction)  Level 1 (Yellow)      Shoulder Exercises: ROM/Strengthening   UBE (Upper Arm Bike)  120 RPM x6 mins 3 fwd 3 bwd                  PT Long Term Goals - 01/30/20 2025      PT LONG TERM GOAL #1   Title  Patient will be independent with HEP    Time  6     Period  Weeks    Status  New      PT LONG TERM GOAL #2   Title  Patient will ambulate 200 feet with rollator and no loss of balance to safely access community.    Time  6    Period  Weeks    Status  New      PT LONG TERM GOAL #3   Title  Patient will demonstrate 4/5 or greater bilateral UE and LE MMT to improve stability during functional tasks.    Time  6    Period  Weeks    Status  New      PT LONG TERM GOAL #4   Title  Patient will improve right grip strength to  40 lbs of force to improve ability to perform fine motor tasks and ADLs.    Time  6    Period  Weeks    Status  New            Plan - 02/01/20 1417    Clinical Impression Statement  Patient responded well to therapy session despite 7/10 global pain. Patient's exercises performed in sitting due to soreness in LEs but with LE unsupported for core control. Patient guided through TEs to which she demonstrated good technique with all. Verbal cuing for hip hinge during sit to stand with excellent carryover of technique. Patient educated on exercises and how to progress HEP  pending on how she feels. Patient reported understanding. Patient reported decrease in pain at end of session.    Personal Factors and Comorbidities  Comorbidity 3+    Comorbidities  Asthma, Anxiety, depression, diverticulitis, graves disease, thyroidectomy    Examination-Activity Limitations  Locomotion Level;Transfers;Stand;Stairs;Caring for Others    Stability/Clinical Decision Making  Evolving/Moderate complexity    Clinical Decision Making  Moderate    Rehab Potential  Fair    PT Frequency  2x / week    PT Duration  6 weeks    PT Treatment/Interventions  Cryotherapy;Electrical Stimulation;Moist Heat;ADLs/Self Care Home Management;Gait training;Stair training;Functional mobility training;Therapeutic activities;Therapeutic exercise;Balance training;Manual techniques;Neuromuscular re-education;Passive range of motion;Patient/family education    PT  Next Visit Plan  nustep, UE and LE strengthening to tolerance. Balance activities in sitting and standing.    PT Home Exercise Plan  see patient education section    Consulted and  Agree with Plan of Care  Patient       Patient will benefit from skilled therapeutic intervention in order to improve the following deficits and impairments:  Decreased activity tolerance, Decreased balance, Difficulty walking, Pain, Impaired UE functional use, Impaired sensation, Decreased strength  Visit Diagnosis: Muscle weakness (generalized)  Unsteadiness on feet     Problem List Patient Active Problem List   Diagnosis Date Noted  . Sensory disturbance 01/20/2020  . Atypical chest pain 01/20/2020  . Obesity, Class III, BMI 40-49.9 (morbid obesity) ([place]) 01/20/2020  . Weakness 01/19/2020  . Hyperglycemia 10/04/2019  . Myalgia 09/05/2019  . Vitamin B 12 deficiency 07/28/2019  . Right sided numbness 06/29/2019  . Constipation 06/15/2019  . Elevated lipase 06/15/2019  . Hypomagnesemia 03/29/2019  . Leukocytosis 03/29/2019  . Abdominal pain 02/10/2019  . Nausea without vomiting 02/10/2019  . Tingling of face 11/30/2018  . Flushing 11/30/2018  . Vitamin D deficiency 10/06/2018  . GERD (gastroesophageal reflux disease) 01/28/2017  . Rectal bleeding 01/28/2017  . 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 Hypothyroidism 04/14/2016  . Gastric polyp   . RUQ abdominal pain 09/06/2014  . Excessive or frequent menstruation 01/13/2013  . Depression     Gabriela Eves, PT, DPT 02/01/2020, 2:22 PM  [facility] [address] [place], [place], [address] Phone: [phone]   Fax:  [phone]  Name: HIEU KRISH MRN: [id number] Date of Birth: 28-Jan-1976

## 2020-02-02 DIAGNOSIS — Z1389 Encounter for screening for other disorder: Secondary | ICD-10-CM | POA: Diagnosis not present

## 2020-02-02 DIAGNOSIS — N882 Stricture and stenosis of cervix uteri: Secondary | ICD-10-CM | POA: Diagnosis not present

## 2020-02-02 DIAGNOSIS — R0789 Other chest pain: Secondary | ICD-10-CM | POA: Diagnosis not present

## 2020-02-02 DIAGNOSIS — Z6841 Body Mass Index (BMI) 40.0 and over, adult: Secondary | ICD-10-CM | POA: Diagnosis not present

## 2020-02-02 DIAGNOSIS — R202 Paresthesia of skin: Secondary | ICD-10-CM | POA: Diagnosis not present

## 2020-02-06 ENCOUNTER — Ambulatory Visit: Payer: BC Managed Care – PPO | Admitting: Physical Therapy

## 2020-02-06 ENCOUNTER — Other Ambulatory Visit: Payer: Self-pay

## 2020-02-06 DIAGNOSIS — M6281 Muscle weakness (generalized): Secondary | ICD-10-CM | POA: Diagnosis not present

## 2020-02-06 DIAGNOSIS — R2681 Unsteadiness on feet: Secondary | ICD-10-CM

## 2020-02-06 NOTE — Therapy (Signed)
[facility] [place], [place], [address] Phone: [phone]   Fax:  [phone]  Physical Therapy Treatment  Patient Details  Name: Heather Fry MRN: [id number] Date of Birth: [birth year] Referring Provider (PT): Orson Eva, MD   Encounter Date: 02/06/2020  PT End of Session - 02/06/20 1328    Visit Number  3    Number of Visits  12    Date for PT Re-Evaluation  03/19/20    Authorization Type  Progress note every 10th visit    PT Start Time  0106    PT Stop Time  0146    PT Time Calculation (min)  40 min    Activity Tolerance  Patient tolerated treatment well;Patient limited by fatigue    Behavior During Therapy  [facility] for tasks assessed/performed       Past Medical History:  Diagnosis Date  . Anxiety   . Arthritis   . Depression   . Diverticulosis   . GERD (gastroesophageal reflux disease)   . Graves disease   . Internal hemorrhoids   . Thyroid disease   . Uterine fibroid     Past Surgical History:  Procedure Laterality Date  . ABDOMINAL HYSTERECTOMY     Per patient, uterine fibroids.  . ABDOMINAL HYSTERECTOMY    . CHOLECYSTECTOMY N/A 11/06/2014   Procedure: LAPAROSCOPIC CHOLECYSTECTOMY;  Surgeon: Jamesetta So, MD;  Location: [facility];  Service: General;  Laterality: N/A;  . COLONOSCOPY N/A 03/04/2017   Dr. Gala Romney: Hemorrhoids, diverticulosis, benign polyp without adenomatous changes.  Next colonoscopy 10 years  . DILATION AND CURETTAGE OF UTERUS    . ESOPHAGOGASTRODUODENOSCOPY N/A 10/02/2014   Dr. Gala Romney: fundic gland polyps, hiatal hernia  . MOUTH SURGERY     extraction of teeth  . POLYPECTOMY  03/04/2017   Procedure: POLYPECTOMY;  Surgeon: Daneil Dolin, MD;  [facility];  Service: Endoscopy;;  colon  . THYROIDECTOMY N/A 09/30/2018   Procedure: TOTAL THYROIDECTOMY;  Surgeon: Armandina Gemma, MD;  Location: [facility];  Service: General;  Laterality: N/A;  . TUBAL LIGATION      There were no vitals  filed for this visit.  Subjective Assessment - 02/06/20 1310    Subjective  COVID-19 screening performed upon arrival. Patient arrived and reported she is awaiting an appt for a nuro surgen. Patient has ongoing pain today    Pertinent History  Asthma, Anxiety, depression, diverticulitis, graves disease, thyroidectomy    Limitations  Standing;Walking;House hold activities    Diagnostic tests  MRI, X-Ray, CT Scan: negative except 3 bulging discs in cervical spine.    Patient Stated Goals  improve movement, decrease numbness in arm and leg    Currently in Pain?  Yes    Pain Score  --   no number given   Pain Location  Generalized    Pain Orientation  Right;Left    Pain Descriptors / Indicators  Sore    Pain Type  Acute pain    Pain Onset  More than a month ago    Pain Frequency  Constant    Aggravating Factors   any prolong walking and activity    Pain Relieving Factors  at rest                       [facility] Adult PT Treatment/Exercise - 02/06/20 0001      Knee/Hip Exercises: Aerobic   Nustep  Level 1 x 10 mins, UE and LE  Knee/Hip Exercises: Seated   Long Arc Quad  AROM;Both;2 sets;10 reps    Hamstring Curl  2 sets;10 reps;Both    Sit to Sand  1 set;10 reps;without UE support   elevated surface     Shoulder Exercises: Seated   Row  Strengthening;Both;20 reps;Theraband    Theraband Level (Shoulder Row)  Level 1 (Yellow)    Horizontal ABduction  AROM;Both;20 reps;Theraband    Theraband Level (Shoulder Horizontal ABduction)  Level 1 (Yellow)      Shoulder Exercises: ROM/Strengthening   UBE (Upper Arm Bike)  120 RPM x6 mins 3 fwd 3 bwd             PT Education - 02/06/20 1339    Education Details  posture awareness techniques    Person(s) Educated  Patient    Methods  Explanation;Demonstration    Comprehension  Verbalized understanding;Returned demonstration          PT Long Term Goals - 02/06/20 1329      PT LONG TERM GOAL #1   Title   Patient will be independent with HEP    Time  6    Period  Weeks    Status  On-going      PT LONG TERM GOAL #2   Title  Patient will ambulate 200 feet with rollator and no loss of balance to safely access community.    Time  6    Period  Weeks    Status  On-going      PT LONG TERM GOAL #3   Title  Patient will demonstrate 4/5 or greater bilateral UE and LE MMT to improve stability during functional tasks.    Time  6    Period  Weeks    Status  On-going      PT LONG TERM GOAL #4   Title  Patient will improve right grip strength to  40 lbs of force to improve ability to perform fine motor tasks and ADLs.    Time  6    Period  Weeks    Status  On-going            Plan - 02/06/20 1335    Clinical Impression Statement  Patient tolerated treatment fair due to overall pain esp in neck and back. Patient very fatigue over the weekend. Today focused on exercises with no progression at this time. Educated patient on posture awareness techniques to help improve functional independence. Patient current goals ongoing due to limitations.    Personal Factors and Comorbidities  Comorbidity 3+    Comorbidities  Asthma, Anxiety, depression, diverticulitis, graves disease, thyroidectomy    Examination-Activity Limitations  Locomotion Level;Transfers;Stand;Stairs;Caring for Others    Stability/Clinical Decision Making  Evolving/Moderate complexity    Rehab Potential  Fair    PT Frequency  2x / week    PT Duration  6 weeks    PT Treatment/Interventions  Cryotherapy;Electrical Stimulation;Moist Heat;ADLs/Self Care Home Management;Gait training;Stair training;Functional mobility training;Therapeutic activities;Therapeutic exercise;Balance training;Manual techniques;Neuromuscular re-education;Passive range of motion;Patient/family education    PT Next Visit Plan  cont with POC for UE and LE strengthening to tolerance. Balance activities in sitting and standing.    Consulted and Agree with Plan of  Care  Patient       Patient will benefit from skilled therapeutic intervention in order to improve the following deficits and impairments:  Decreased activity tolerance, Decreased balance, Difficulty walking, Pain, Impaired UE functional use, Impaired sensation, Decreased strength  Visit Diagnosis: Muscle weakness (generalized)  Unsteadiness  on feet     Problem List Patient Active Problem List   Diagnosis Date Noted  . Sensory disturbance 01/20/2020  . Atypical chest pain 01/20/2020  . Obesity, Class III, BMI 40-49.9 (morbid obesity) ([place]) 01/20/2020  . Weakness 01/19/2020  . Hyperglycemia 10/04/2019  . Myalgia 09/05/2019  . Vitamin B 12 deficiency 07/28/2019  . Right sided numbness 06/29/2019  . Constipation 06/15/2019  . Elevated lipase 06/15/2019  . Hypomagnesemia 03/29/2019  . Leukocytosis 03/29/2019  . Abdominal pain 02/10/2019  . Nausea without vomiting 02/10/2019  . Tingling of face 11/30/2018  . Flushing 11/30/2018  . Vitamin D deficiency 10/06/2018  . GERD (gastroesophageal reflux disease) 01/28/2017  . Rectal bleeding 01/28/2017  . 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 Hypothyroidism 04/14/2016  . Gastric polyp   . RUQ abdominal pain 09/06/2014  . Excessive or frequent menstruation 01/13/2013  . Depression     Jemiah Cuadra P, PTA 02/06/2020, 1:48 PM  [facility] [address] [place], [place], [address] Phone: [phone]   Fax:  [phone]  Name: LILEE REMENTER MRN: [id number] Date of Birth: [birth year]

## 2020-02-08 ENCOUNTER — Encounter: Payer: BC Managed Care – PPO | Admitting: Physical Therapy

## 2020-02-09 ENCOUNTER — Ambulatory Visit: Payer: BC Managed Care – PPO | Admitting: *Deleted

## 2020-02-09 ENCOUNTER — Other Ambulatory Visit: Payer: Self-pay

## 2020-02-09 DIAGNOSIS — R2681 Unsteadiness on feet: Secondary | ICD-10-CM

## 2020-02-09 DIAGNOSIS — M6281 Muscle weakness (generalized): Secondary | ICD-10-CM

## 2020-02-09 NOTE — Therapy (Signed)
[facility] [place], [place], [address] Phone: [phone]   Fax:  [phone]  Physical Therapy Treatment  Patient Details  Name: Heather Fry MRN: [id number] Date of Birth: 1975/10/11 Referring Provider (PT): Orson Eva, MD   Encounter Date: 02/09/2020  PT End of Session - 02/09/20 1312    Visit Number  4    Number of Visits  12    Date for PT Re-Evaluation  03/19/20    Authorization Type  Progress note every 10th visit    PT Start Time  1300    PT Stop Time  1346    PT Time Calculation (min)  46 min       Past Medical History:  Diagnosis Date  . Anxiety   . Arthritis   . Depression   . Diverticulosis   . GERD (gastroesophageal reflux disease)   . Graves disease   . Internal hemorrhoids   . Thyroid disease   . Uterine fibroid     Past Surgical History:  Procedure Laterality Date  . ABDOMINAL HYSTERECTOMY     Per patient, uterine fibroids.  . ABDOMINAL HYSTERECTOMY    . CHOLECYSTECTOMY N/A 11/06/2014   Procedure: LAPAROSCOPIC CHOLECYSTECTOMY;  Surgeon: Jamesetta So, MD;  Location: [facility];  Service: General;  Laterality: N/A;  . COLONOSCOPY N/A 03/04/2017   Dr. Gala Romney: Hemorrhoids, diverticulosis, benign polyp without adenomatous changes.  Next colonoscopy 10 years  . DILATION AND CURETTAGE OF UTERUS    . ESOPHAGOGASTRODUODENOSCOPY N/A 10/02/2014   Dr. Gala Romney: fundic gland polyps, hiatal hernia  . MOUTH SURGERY     extraction of teeth  . POLYPECTOMY  03/04/2017   Procedure: POLYPECTOMY;  Surgeon: Daneil Dolin, MD;  [facility];  Service: Endoscopy;;  colon  . THYROIDECTOMY N/A 09/30/2018   Procedure: TOTAL THYROIDECTOMY;  Surgeon: Armandina Gemma, MD;  [facility];  Service: General;  Laterality: N/A;  . TUBAL LIGATION      There were no vitals filed for this visit.  Subjective Assessment - 02/09/20 1309    Subjective  COVID-19 screening performed upon arrival. Patient arrived and reported  she is awaiting an appt for a neuro surgeon. Had to cx last appt due to pain    Pertinent History  Asthma, Anxiety, depression, diverticulitis, graves disease, thyroidectomy    Limitations  Standing;Walking;House hold activities    Diagnostic tests  MRI, X-Ray, CT Scan: negative except 3 bulging discs in cervical spine.    Patient Stated Goals  improve movement, decrease numbness in arm and leg    Currently in Pain?  Yes    Pain Score  3     Pain Location  Generalized    Pain Orientation  Right;Left    Pain Descriptors / Indicators  Sore    Pain Type  Acute pain                        [facility] Adult PT Treatment/Exercise - 02/09/20 0001      Exercises   Exercises  Knee/Hip;Shoulder      Knee/Hip Exercises: Aerobic   Nustep  Level 1 x 10 mins, UE and LE      Knee/Hip Exercises: Standing   Hip Abduction  AROM;Both;2 sets;5 reps    Rocker Board  3 minutes   PF/ DF   Other Standing Knee Exercises  Toe taps 1x10 with UE assist,  2x10 no UE assist forward, 3x5 each side from the  side. with SBA all exs      Knee/Hip Exercises: Seated   Long Arc Quad  Strengthening;Both;2 sets;10 reps    Long Arc Quad Weight  3 lbs.    Hamstring Curl  2 sets;10 reps;Both;Strengthening    Hamstring Limitations  red tband                  PT Long Term Goals - 02/06/20 1329      PT LONG TERM GOAL #1   Title  Patient will be independent with HEP    Time  6    Period  Weeks    Status  On-going      PT LONG TERM GOAL #2   Title  Patient will ambulate 200 feet with rollator and no loss of balance to safely access community.    Time  6    Period  Weeks    Status  On-going      PT LONG TERM GOAL #3   Title  Patient will demonstrate 4/5 or greater bilateral UE and LE MMT to improve stability during functional tasks.    Time  6    Period  Weeks    Status  On-going      PT LONG TERM GOAL #4   Title  Patient will improve right grip strength to  40 lbs of force to improve  ability to perform fine motor tasks and ADLs.    Time  6    Period  Weeks    Status  On-going            Plan - 02/09/20 1400    Clinical Impression Statement  Pt arrived today doing better with less pain and was able to perform LE strengthening exs and balance act.'s without increased pain. UE act.'s were less due to cervical pain yesterday and She has an appt. with a Chief of Staff tomorrow.    Personal Factors and Comorbidities  Comorbidity 3+    Comorbidities  Asthma, Anxiety, depression, diverticulitis, graves disease, thyroidectomy    Examination-Activity Limitations  Locomotion Level;Transfers;Stand;Stairs;Caring for Others    Stability/Clinical Decision Making  Evolving/Moderate complexity    Rehab Potential  Fair    PT Frequency  2x / week    PT Duration  6 weeks    PT Treatment/Interventions  Cryotherapy;Electrical Stimulation;Moist Heat;ADLs/Self Care Home Management;Gait training;Stair training;Functional mobility training;Therapeutic activities;Therapeutic exercise;Balance training;Manual techniques;Neuromuscular re-education;Passive range of motion;Patient/family education    PT Next Visit Plan  cont with POC for UE and LE strengthening to tolerance. Balance activities in sitting and standing.    PT Home Exercise Plan  see patient education section    Consulted and Agree with Plan of Care  Patient       Patient will benefit from skilled therapeutic intervention in order to improve the following deficits and impairments:  Decreased activity tolerance, Decreased balance, Difficulty walking, Pain, Impaired UE functional use, Impaired sensation, Decreased strength  Visit Diagnosis: Unsteadiness on feet  Muscle weakness (generalized)     Problem List Patient Active Problem List   Diagnosis Date Noted  . Sensory disturbance 01/20/2020  . Atypical chest pain 01/20/2020  . Obesity, Class III, BMI 40-49.9 (morbid obesity) ([place]) 01/20/2020  . Weakness 01/19/2020  .  Hyperglycemia 10/04/2019  . Myalgia 09/05/2019  . Vitamin B 12 deficiency 07/28/2019  . Right sided numbness 06/29/2019  . Constipation 06/15/2019  . Elevated lipase 06/15/2019  . Hypomagnesemia 03/29/2019  . Leukocytosis 03/29/2019  . Abdominal pain 02/10/2019  .  Nausea without vomiting 02/10/2019  . Tingling of face 11/30/2018  . Flushing 11/30/2018  . Vitamin D deficiency 10/06/2018  . GERD (gastroesophageal reflux disease) 01/28/2017  . Rectal bleeding 01/28/2017  . 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 Hypothyroidism 04/14/2016  . Gastric polyp   . RUQ abdominal pain 09/06/2014  . Excessive or frequent menstruation 01/13/2013  . Depression     Duran Ohern,CHRIS, PTA 02/09/2020, 2:07 PM  [facility] [place], [place], [address] Phone: [phone]   Fax:  [phone]  Name: Heather Fry MRN: [id number] Date of Birth: Jul 19, 1976

## 2020-02-10 DIAGNOSIS — M47812 Spondylosis without myelopathy or radiculopathy, cervical region: Secondary | ICD-10-CM | POA: Diagnosis not present

## 2020-02-10 DIAGNOSIS — Z6841 Body Mass Index (BMI) 40.0 and over, adult: Secondary | ICD-10-CM | POA: Diagnosis not present

## 2020-02-10 DIAGNOSIS — I10 Essential (primary) hypertension: Secondary | ICD-10-CM | POA: Diagnosis not present

## 2020-02-13 ENCOUNTER — Ambulatory Visit: Payer: BC Managed Care – PPO | Admitting: Physical Therapy

## 2020-02-16 ENCOUNTER — Other Ambulatory Visit: Payer: Self-pay

## 2020-02-16 ENCOUNTER — Ambulatory Visit: Payer: BC Managed Care – PPO | Admitting: Physical Therapy

## 2020-02-16 ENCOUNTER — Encounter: Payer: Self-pay | Admitting: Physical Therapy

## 2020-02-16 DIAGNOSIS — M6281 Muscle weakness (generalized): Secondary | ICD-10-CM | POA: Diagnosis not present

## 2020-02-16 DIAGNOSIS — R2681 Unsteadiness on feet: Secondary | ICD-10-CM | POA: Diagnosis not present

## 2020-02-16 NOTE — Therapy (Signed)
[facility] [place], [place], [address] Phone: [phone]   Fax:  [phone]  Physical Therapy Treatment  Patient Details  Name: Heather Fry MRN: [id number] Date of Birth: [birth year] Referring Provider (PT): Orson Eva, MD   Encounter Date: 02/16/2020  PT End of Session - 02/16/20 1311    Visit Number  5    Number of Visits  12    Date for PT Re-Evaluation  03/19/20    Authorization Type  Progress note every 10th visit    PT Start Time  1303    PT Stop Time  1350   rest break as patient told medical journey   PT Time Calculation (min)  47 min    Activity Tolerance  Patient tolerated treatment well    Behavior During Therapy  [facility] for tasks assessed/performed       Past Medical History:  Diagnosis Date  . Anxiety   . Arthritis   . Depression   . Diverticulosis   . GERD (gastroesophageal reflux disease)   . Graves disease   . Internal hemorrhoids   . Thyroid disease   . Uterine fibroid     Past Surgical History:  Procedure Laterality Date  . ABDOMINAL HYSTERECTOMY     Per patient, uterine fibroids.  . ABDOMINAL HYSTERECTOMY    . CHOLECYSTECTOMY N/A 11/06/2014   Procedure: LAPAROSCOPIC CHOLECYSTECTOMY;  Surgeon: Jamesetta So, MD;  Location: [facility];  Service: General;  Laterality: N/A;  . COLONOSCOPY N/A 03/04/2017   Dr. Gala Romney: Hemorrhoids, diverticulosis, benign polyp without adenomatous changes.  Next colonoscopy 10 years  . DILATION AND CURETTAGE OF UTERUS    . ESOPHAGOGASTRODUODENOSCOPY N/A 10/02/2014   Dr. Gala Romney: fundic gland polyps, hiatal hernia  . MOUTH SURGERY     extraction of teeth  . POLYPECTOMY  03/04/2017   Procedure: POLYPECTOMY;  Surgeon: Daneil Dolin, MD;  [facility];  Service: Endoscopy;;  colon  . THYROIDECTOMY N/A 09/30/2018   Procedure: TOTAL THYROIDECTOMY;  Surgeon: Armandina Gemma, MD;  [facility];  Service: General;  Laterality: N/A;  . TUBAL LIGATION      There  were no vitals filed for this visit.  Subjective Assessment - 02/16/20 1305    Subjective  COVID-19 screening performed upon arrival. Reports only minimal bulging disc in cervical spine but has been referred to new MD as they think she may have MS as she has symptoms per patient report.    Pertinent History  Asthma, Anxiety, depression, diverticulitis, graves disease, thyroidectomy    Limitations  Standing;Walking;House hold activities    Diagnostic tests  MRI, X-Ray, CT Scan: negative except 3 bulging discs in cervical spine.    Patient Stated Goals  improve movement, decrease numbness in arm and leg    Currently in Pain?  No/denies         [facility] PT Assessment - 02/16/20 0001      Assessment   Medical Diagnosis  right sided numbness    Referring Provider (PT)  Orson Eva, MD    Hand Dominance  Right    Next MD Visit  n/a    Prior Therapy  no      Precautions   Precautions  [facility] Adult PT Treatment/Exercise - 02/16/20 0001      Knee/Hip Exercises: Aerobic   Nustep  L3 x10 min  Knee/Hip Exercises: Seated   Long Arc Quad  Strengthening;Both;2 sets;10 reps    Long Arc Quad Weight  3 lbs.    Clamshell with TheraBand  Yellow   x20 reps   Other Seated Knee/Hip Exercises  B heel/toe raise x20 reps    Hamstring Curl  2 sets;10 reps;Both;Strengthening    Hamstring Limitations  red theraband                  PT Long Term Goals - 02/06/20 1329      PT LONG TERM GOAL #1   Title  Patient will be independent with HEP    Time  6    Period  Weeks    Status  On-going      PT LONG TERM GOAL #2   Title  Patient will ambulate 200 feet with rollator and no loss of balance to safely access community.    Time  6    Period  Weeks    Status  On-going      PT LONG TERM GOAL #3   Title  Patient will demonstrate 4/5 or greater bilateral UE and LE MMT to improve stability during functional tasks.    Time  6    Period  Weeks    Status   On-going      PT LONG TERM GOAL #4   Title  Patient will improve right grip strength to  40 lbs of force to improve ability to perform fine motor tasks and ADLs.    Time  6    Period  Weeks    Status  On-going            Plan - 02/16/20 1452    Clinical Impression Statement  Patient presented in clinic today with positive report from neurosurgeon but awaiting neurologist appointment. Patient had no pain during treatment but only reports of R hand numbness during Nustep. No complaints of fatigue during treatment today. Prolonged rest break provided as patient was discussing medical journey.    Personal Factors and Comorbidities  Comorbidity 3+    Comorbidities  Asthma, Anxiety, depression, diverticulitis, graves disease, thyroidectomy    Examination-Activity Limitations  Locomotion Level;Transfers;Stand;Stairs;Caring for Others    Stability/Clinical Decision Making  Evolving/Moderate complexity    Rehab Potential  Fair    PT Frequency  2x / week    PT Duration  6 weeks    PT Treatment/Interventions  Cryotherapy;Electrical Stimulation;Moist Heat;ADLs/Self Care Home Management;Gait training;Stair training;Functional mobility training;Therapeutic activities;Therapeutic exercise;Balance training;Manual techniques;Neuromuscular re-education;Passive range of motion;Patient/family education    PT Next Visit Plan  cont with POC for UE and LE strengthening to tolerance. Balance activities in sitting and standing.    PT Home Exercise Plan  see patient education section    Consulted and Agree with Plan of Care  Patient       Patient will benefit from skilled therapeutic intervention in order to improve the following deficits and impairments:  Decreased activity tolerance, Decreased balance, Difficulty walking, Pain, Impaired UE functional use, Impaired sensation, Decreased strength  Visit Diagnosis: Unsteadiness on feet  Muscle weakness (generalized)     Problem List Patient Active  Problem List   Diagnosis Date Noted  . Sensory disturbance 01/20/2020  . Atypical chest pain 01/20/2020  . Obesity, Class III, BMI 40-49.9 (morbid obesity) ([place]) 01/20/2020  . Weakness 01/19/2020  . Hyperglycemia 10/04/2019  . Myalgia 09/05/2019  . Vitamin B 12 deficiency 07/28/2019  . Right sided numbness 06/29/2019  . Constipation 06/15/2019  .  Elevated lipase 06/15/2019  . Hypomagnesemia 03/29/2019  . Leukocytosis 03/29/2019  . Abdominal pain 02/10/2019  . Nausea without vomiting 02/10/2019  . Tingling of face 11/30/2018  . Flushing 11/30/2018  . Vitamin D deficiency 10/06/2018  . GERD (gastroesophageal reflux disease) 01/28/2017  . Rectal bleeding 01/28/2017  . 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 Hypothyroidism 04/14/2016  . Gastric polyp   . RUQ abdominal pain 09/06/2014  . Excessive or frequent menstruation 01/13/2013  . Depression     Standley Brooking, PTA 02/16/2020, 2:55 PM  [facility] [place], [place], [address] Phone: [phone]   Fax:  [phone]  Name: Heather Fry MRN: [id number] Date of Birth: 12-04-1975

## 2020-02-20 ENCOUNTER — Ambulatory Visit: Payer: BC Managed Care – PPO | Admitting: Physical Therapy

## 2020-02-21 ENCOUNTER — Encounter: Payer: Self-pay | Admitting: Physical Therapy

## 2020-02-21 ENCOUNTER — Ambulatory Visit: Payer: BC Managed Care – PPO | Admitting: Physical Therapy

## 2020-02-21 DIAGNOSIS — R2681 Unsteadiness on feet: Secondary | ICD-10-CM | POA: Diagnosis not present

## 2020-02-21 DIAGNOSIS — M6281 Muscle weakness (generalized): Secondary | ICD-10-CM | POA: Diagnosis not present

## 2020-02-21 NOTE — Therapy (Signed)
[facility] [place], [place], [address] Phone: [phone]   Fax:  [phone]  Physical Therapy Treatment  Patient Details  Name: Heather Fry MRN: [id number] Date of Birth: Apr 19, 1976 Referring Provider (PT): Orson Eva, MD   Encounter Date: 02/21/2020  PT End of Session - 02/21/20 1314    Visit Number  6    Number of Visits  12    Date for PT Re-Evaluation  03/19/20    Authorization Type  Progress note every 10th visit    PT Start Time  1301    PT Stop Time  1352    PT Time Calculation (min)  51 min    Activity Tolerance  Patient tolerated treatment well    Behavior During Therapy  [facility] for tasks assessed/performed       Past Medical History:  Diagnosis Date  . Anxiety   . Arthritis   . Depression   . Diverticulosis   . GERD (gastroesophageal reflux disease)   . Graves disease   . Internal hemorrhoids   . Thyroid disease   . Uterine fibroid     Past Surgical History:  Procedure Laterality Date  . ABDOMINAL HYSTERECTOMY     Per patient, uterine fibroids.  . ABDOMINAL HYSTERECTOMY    . CHOLECYSTECTOMY N/A 11/06/2014   Procedure: LAPAROSCOPIC CHOLECYSTECTOMY;  Surgeon: Jamesetta So, MD;  Location: [facility];  Service: General;  Laterality: N/A;  . COLONOSCOPY N/A 03/04/2017   Dr. Gala Romney: Hemorrhoids, diverticulosis, benign polyp without adenomatous changes.  Next colonoscopy 10 years  . DILATION AND CURETTAGE OF UTERUS    . ESOPHAGOGASTRODUODENOSCOPY N/A 10/02/2014   Dr. Gala Romney: fundic gland polyps, hiatal hernia  . MOUTH SURGERY     extraction of teeth  . POLYPECTOMY  03/04/2017   Procedure: POLYPECTOMY;  Surgeon: Daneil Dolin, MD;  [facility]: [facility];  Service: Endoscopy;;  colon  . THYROIDECTOMY N/A 09/30/2018   Procedure: TOTAL THYROIDECTOMY;  Surgeon: Armandina Gemma, MD;  Location: [facility];  Service: General;  Laterality: N/A;  . TUBAL LIGATION      There were no vitals filed for this  visit.  Subjective Assessment - 02/21/20 1313    Subjective  COVID 19 screening performed on patient upon arrival. Patient states that she had a flare up of MS symptoms this weekend with numbness and in MS hug. Patient denies any pain during nustep only R hand numbness.    Pertinent History  Asthma, Anxiety, depression, diverticulitis, graves disease, thyroidectomy    Limitations  Standing;Walking;House hold activities    Diagnostic tests  MRI, X-Ray, CT Scan: negative except 3 bulging discs in cervical spine.    Patient Stated Goals  improve movement, decrease numbness in arm and leg    Currently in Pain?  No/denies         [facility] PT Assessment - 02/21/20 0001      Assessment   Medical Diagnosis  right sided numbness    Referring Provider (PT)  Orson Eva, MD    Hand Dominance  Right    Next MD Visit  02/22/2020 neurologist    Prior Therapy  no      Precautions   Precautions  Fall                    [facility] Adult PT Treatment/Exercise - 02/21/20 0001      Knee/Hip Exercises: Aerobic   Nustep  L3 x10 min      Knee/Hip Exercises:  Seated   Long Arc [employer]  Strengthening;Both;2 sets;10 reps    Long Arc Quad Weight  3 lbs.    Ball Squeeze  x15 reps    Clamshell with TheraBand  Yellow   x10 reps   Other Seated Knee/Hip Exercises  B heel/toe raise x20 reps    Hamstring Curl  Strengthening;Both;1 set;10 reps;Limitations    Hamstring Limitations  yellow theraband    Sit to Sand  10 reps;without UE support      Shoulder Exercises: Seated   Row  Strengthening;Both;15 reps;Theraband    Theraband Level (Shoulder Row)  Level 1 (Yellow)      Shoulder Exercises: ROM/Strengthening   UBE (Upper Arm Bike)  120 RPM x4 min (forward/backward)                  PT Long Term Goals - 02/06/20 1329      PT LONG TERM GOAL #1   Title  Patient will be independent with HEP    Time  6    Period  Weeks    Status  On-going      PT LONG TERM GOAL #2   Title  Patient will  ambulate 200 feet with rollator and no loss of balance to safely access community.    Time  6    Period  Weeks    Status  On-going      PT LONG TERM GOAL #3   Title  Patient will demonstrate 4/5 or greater bilateral UE and LE MMT to improve stability during functional tasks.    Time  6    Period  Weeks    Status  On-going      PT LONG TERM GOAL #4   Title  Patient will improve right grip strength to  40 lbs of force to improve ability to perform fine motor tasks and ADLs.    Time  6    Period  Weeks    Status  On-going            Plan - 02/21/20 1357    Clinical Impression Statement  Patient presented in clinic with with reports of increased MS symptoms over the weekend. Patient did require seated rest breaks during therex due to fatigue and SOB. Minimal reps completed today as patient stated that she was weak in her LEs today.    Personal Factors and Comorbidities  Comorbidity 3+    Comorbidities  Asthma, Anxiety, depression, diverticulitis, graves disease, thyroidectomy    Examination-Activity Limitations  Locomotion Level;Transfers;Stand;Stairs;Caring for Others    Stability/Clinical Decision Making  Evolving/Moderate complexity    Rehab Potential  Fair    PT Frequency  2x / week    PT Duration  6 weeks    PT Treatment/Interventions  Cryotherapy;Electrical Stimulation;Moist Heat;ADLs/Self Care Home Management;Gait training;Stair training;Functional mobility training;Therapeutic activities;Therapeutic exercise;Balance training;Manual techniques;Neuromuscular re-education;Passive range of motion;Patient/family education    PT Next Visit Plan  cont with POC for UE and LE strengthening to tolerance. Balance activities in sitting and standing.    PT Home Exercise Plan  see patient education section    Consulted and Agree with Plan of Care  Patient       Patient will benefit from skilled therapeutic intervention in order to improve the following deficits and impairments:   Decreased activity tolerance, Decreased balance, Difficulty walking, Pain, Impaired UE functional use, Impaired sensation, Decreased strength  Visit Diagnosis: Unsteadiness on feet  Muscle weakness (generalized)     Problem List Patient Active Problem  List   Diagnosis Date Noted  . Sensory disturbance 01/20/2020  . Atypical chest pain 01/20/2020  . Obesity, Class III, BMI 40-49.9 (morbid obesity) ([place]) 01/20/2020  . Weakness 01/19/2020  . Hyperglycemia 10/04/2019  . Myalgia 09/05/2019  . Vitamin B 12 deficiency 07/28/2019  . Right sided numbness 06/29/2019  . Constipation 06/15/2019  . Elevated lipase 06/15/2019  . Hypomagnesemia 03/29/2019  . Leukocytosis 03/29/2019  . Abdominal pain 02/10/2019  . Nausea without vomiting 02/10/2019  . Tingling of face 11/30/2018  . Flushing 11/30/2018  . Vitamin D deficiency 10/06/2018  . GERD (gastroesophageal reflux disease) 01/28/2017  . Rectal bleeding 01/28/2017  . 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 Hypothyroidism 04/14/2016  . Gastric polyp   . RUQ abdominal pain 09/06/2014  . Excessive or frequent menstruation 01/13/2013  . Depression     Standley Brooking, PTA 02/21/2020, 2:02 PM  [facility] [address] [place], [place], [address] Phone: [phone]   Fax:  [phone]  Name: SHARDE JURICA MRN: [id number] Date of Birth: 06-30-1976

## 2020-03-01 ENCOUNTER — Ambulatory Visit: Payer: BC Managed Care – PPO | Admitting: Physical Therapy

## 2020-03-06 DIAGNOSIS — R0602 Shortness of breath: Secondary | ICD-10-CM | POA: Diagnosis not present

## 2020-03-06 DIAGNOSIS — Z90710 Acquired absence of both cervix and uterus: Secondary | ICD-10-CM | POA: Diagnosis not present

## 2020-03-06 DIAGNOSIS — J209 Acute bronchitis, unspecified: Secondary | ICD-10-CM | POA: Diagnosis not present

## 2020-03-06 DIAGNOSIS — J984 Other disorders of lung: Secondary | ICD-10-CM | POA: Diagnosis not present

## 2020-03-06 DIAGNOSIS — J45909 Unspecified asthma, uncomplicated: Secondary | ICD-10-CM | POA: Diagnosis not present

## 2020-03-06 DIAGNOSIS — Z9851 Tubal ligation status: Secondary | ICD-10-CM | POA: Diagnosis not present

## 2020-03-06 DIAGNOSIS — Z87891 Personal history of nicotine dependence: Secondary | ICD-10-CM | POA: Diagnosis not present

## 2020-03-06 DIAGNOSIS — Z9049 Acquired absence of other specified parts of digestive tract: Secondary | ICD-10-CM | POA: Diagnosis not present

## 2020-03-06 DIAGNOSIS — B9689 Other specified bacterial agents as the cause of diseases classified elsewhere: Secondary | ICD-10-CM | POA: Diagnosis not present

## 2020-03-07 DIAGNOSIS — G959 Disease of spinal cord, unspecified: Secondary | ICD-10-CM | POA: Diagnosis not present

## 2020-03-07 DIAGNOSIS — G2581 Restless legs syndrome: Secondary | ICD-10-CM | POA: Diagnosis not present

## 2020-03-07 DIAGNOSIS — G609 Hereditary and idiopathic neuropathy, unspecified: Secondary | ICD-10-CM | POA: Diagnosis not present

## 2020-03-08 ENCOUNTER — Ambulatory Visit: Payer: BC Managed Care – PPO | Attending: Internal Medicine | Admitting: Physical Therapy

## 2020-03-08 ENCOUNTER — Other Ambulatory Visit: Payer: Self-pay

## 2020-03-08 ENCOUNTER — Encounter: Payer: Self-pay | Admitting: Physical Therapy

## 2020-03-08 DIAGNOSIS — M6281 Muscle weakness (generalized): Secondary | ICD-10-CM | POA: Diagnosis not present

## 2020-03-08 DIAGNOSIS — R2681 Unsteadiness on feet: Secondary | ICD-10-CM | POA: Diagnosis not present

## 2020-03-08 NOTE — Therapy (Signed)
[facility] [place], [place], [address] Phone: [phone]   Fax:  [phone]  Physical Therapy Treatment  Patient Details  Name: Heather Fry MRN: [id number] Date of Birth: 1976/07/10 Referring Provider (PT): Orson Eva, MD   Encounter Date: 03/08/2020   PT End of Session - 03/08/20 1444    Visit Number 7    Number of Visits 12    Date for PT Re-Evaluation 03/19/20    Authorization Type Progress note every 10th visit    PT Start Time 0230    PT Stop Time 0302    PT Time Calculation (min) 32 min    Activity Tolerance Patient tolerated treatment well;Other (comment)   limited due to breathing problems   Behavior During Therapy [facility] for tasks assessed/performed           Past Medical History:  Diagnosis Date   Anxiety    Arthritis    Depression    Diverticulosis    GERD (gastroesophageal reflux disease)    Graves disease    Internal hemorrhoids    Thyroid disease    Uterine fibroid     Past Surgical History:  Procedure Laterality Date   ABDOMINAL HYSTERECTOMY     Per patient, uterine fibroids.   ABDOMINAL HYSTERECTOMY     CHOLECYSTECTOMY N/A 11/06/2014   Procedure: LAPAROSCOPIC CHOLECYSTECTOMY;  Surgeon: Jamesetta So, MD;  Location: [facility];  Service: General;  Laterality: N/A;   COLONOSCOPY N/A 03/04/2017   Dr. Gala Romney: Hemorrhoids, diverticulosis, benign polyp without adenomatous changes.  Next colonoscopy 10 years   DILATION AND CURETTAGE OF UTERUS     ESOPHAGOGASTRODUODENOSCOPY N/A 10/02/2014   Dr. Gala Romney: fundic gland polyps, hiatal hernia   MOUTH SURGERY     extraction of teeth   POLYPECTOMY  03/04/2017   Procedure: POLYPECTOMY;  Surgeon: Daneil Dolin, MD;  [facility];  Service: Endoscopy;;  colon   THYROIDECTOMY N/A 09/30/2018   Procedure: TOTAL THYROIDECTOMY;  Surgeon: Armandina Gemma, MD;  Location: [facility];  Service: General;  Laterality: N/A;   TUBAL LIGATION       There were no vitals filed for this visit.   Subjective Assessment - 03/08/20 1432    Subjective COVID 19 screening performed on patient upon arrival. Patient states that she has had several MD appt and no pain today, just breathing problems    Pertinent History Asthma, Anxiety, depression, diverticulitis, graves disease, thyroidectomy    Limitations Standing;Walking;House hold activities    Diagnostic tests MRI, X-Ray, CT Scan: negative except 3 bulging discs in cervical spine.    Patient Stated Goals improve movement, decrease numbness in arm and leg    Currently in Pain? No/denies              [facility] PT Assessment - 03/08/20 0001      Strength   Right Hand Grip (lbs) 50    Left Hand Grip (lbs) 50                         [facility] Adult PT Treatment/Exercise - 03/08/20 0001      Knee/Hip Exercises: Aerobic   Nustep L3 x 23mn UE/LE activity      Knee/Hip Exercises: Seated   Long Arc Quad Strengthening;Both;1 set;10 reps    Long Arc Quad Weight 3 lbs.    Ball Squeeze x15 reps    Clamshell with TheraBand Yellow   x10   Hamstring Curl Strengthening;Both;1 set;10  reps;Limitations    Hamstring Limitations yellow theraband    Sit to Sand 10 reps;without UE support      Shoulder Exercises: Seated   Other Seated Exercises seated lat pull w yellow band x10      Shoulder Exercises: ROM/Strengthening   UBE (Upper Arm Bike) 120 RPM x6 min (forward/backward)                       PT Long Term Goals - 03/08/20 1437      PT LONG TERM GOAL #1   Title Patient will be independent with HEP    Time 6    Period Weeks    Status On-going      PT LONG TERM GOAL #2   Title Patient will ambulate 200 feet with rollator and no loss of balance to safely access community.    Time 6    Period Weeks    Status On-going      PT LONG TERM GOAL #3   Title Patient will demonstrate 4/5 or greater bilateral UE and LE MMT to improve stability during functional tasks.     Time 6    Period Weeks    Status On-going      PT LONG TERM GOAL #4   Title Patient will improve right grip strength to  40 lbs of force to improve ability to perform fine motor tasks and ADLs.    Baseline Met 03/08/20 50#    Time 6    Period Weeks    Status Achieved                 Plan - 03/08/20 1458    Clinical Impression Statement Patient tolerated treatment fair due to breathing problems today. Patient was not able to progress with resistance or reps today. Patienthas improved grip strength and has been starting a slow walking progresson daily per her MD. Patient met LTG #4 today with others ongoing.    Personal Factors and Comorbidities Comorbidity 3+    Comorbidities Asthma, Anxiety, depression, diverticulitis, graves disease, thyroidectomy    Examination-Activity Limitations Locomotion Level;Transfers;Stand;Stairs;Caring for Others    Stability/Clinical Decision Making Evolving/Moderate complexity    Rehab Potential Fair    PT Frequency 2x / week    PT Duration 6 weeks    PT Treatment/Interventions Cryotherapy;Electrical Stimulation;Moist Heat;ADLs/Self Care Home Management;Gait training;Stair training;Functional mobility training;Therapeutic activities;Therapeutic exercise;Balance training;Manual techniques;Neuromuscular re-education;Passive range of motion;Patient/family education    PT Next Visit Plan cont with POC for UE and LE strengthening to tolerance. Balance activities in sitting and standing.    Consulted and Agree with Plan of Care Patient           Patient will benefit from skilled therapeutic intervention in order to improve the following deficits and impairments:  Decreased activity tolerance, Decreased balance, Difficulty walking, Pain, Impaired UE functional use, Impaired sensation, Decreased strength  Visit Diagnosis: Unsteadiness on feet  Muscle weakness (generalized)     Problem List Patient Active Problem List   Diagnosis Date Noted    Sensory disturbance 01/20/2020   Atypical chest pain 01/20/2020   Obesity, Class III, BMI 40-49.9 (morbid obesity) ([place]) 01/20/2020   Weakness 01/19/2020   Hyperglycemia 10/04/2019   Myalgia 09/05/2019   Vitamin B 12 deficiency 07/28/2019   Right sided numbness 06/29/2019   Constipation 06/15/2019   Elevated lipase 06/15/2019   Hypomagnesemia 03/29/2019   Leukocytosis 03/29/2019   Abdominal pain 02/10/2019   Nausea without vomiting 02/10/2019   Tingling  of face 11/30/2018   Flushing 11/30/2018   Vitamin D deficiency 10/06/2018   GERD (gastroesophageal reflux disease) 01/28/2017   Rectal bleeding 01/28/2017   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   Hypothyroidism 04/14/2016   Gastric polyp    RUQ abdominal pain 09/06/2014   Excessive or frequent menstruation 01/13/2013   Depression     Tecla Mailloux P, PTA 03/08/2020, 3:04 PM  [facility] [place], [place], [address] Phone: [phone]   Fax:  [phone]  Name: Heather Fry MRN: [id number] Date of Birth: 04-21-1976

## 2020-03-12 ENCOUNTER — Ambulatory Visit: Payer: BC Managed Care – PPO

## 2020-03-12 ENCOUNTER — Other Ambulatory Visit (HOSPITAL_COMMUNITY): Payer: BC Managed Care – PPO

## 2020-03-12 DIAGNOSIS — Z8249 Family history of ischemic heart disease and other diseases of the circulatory system: Secondary | ICD-10-CM | POA: Diagnosis not present

## 2020-03-12 DIAGNOSIS — W260XXA Contact with knife, initial encounter: Secondary | ICD-10-CM | POA: Diagnosis not present

## 2020-03-12 DIAGNOSIS — S61213A Laceration without foreign body of left middle finger without damage to nail, initial encounter: Secondary | ICD-10-CM | POA: Diagnosis not present

## 2020-03-12 DIAGNOSIS — R030 Elevated blood-pressure reading, without diagnosis of hypertension: Secondary | ICD-10-CM | POA: Diagnosis not present

## 2020-03-12 DIAGNOSIS — Z23 Encounter for immunization: Secondary | ICD-10-CM | POA: Diagnosis not present

## 2020-03-12 DIAGNOSIS — S61211A Laceration without foreign body of left index finger without damage to nail, initial encounter: Secondary | ICD-10-CM | POA: Diagnosis not present

## 2020-03-12 DIAGNOSIS — Z87891 Personal history of nicotine dependence: Secondary | ICD-10-CM | POA: Diagnosis not present

## 2020-03-13 ENCOUNTER — Encounter: Payer: Self-pay | Admitting: Physical Therapy

## 2020-03-13 ENCOUNTER — Other Ambulatory Visit: Payer: Self-pay

## 2020-03-13 ENCOUNTER — Ambulatory Visit: Payer: BC Managed Care – PPO | Admitting: Physical Therapy

## 2020-03-13 DIAGNOSIS — M6281 Muscle weakness (generalized): Secondary | ICD-10-CM | POA: Diagnosis not present

## 2020-03-13 DIAGNOSIS — R2681 Unsteadiness on feet: Secondary | ICD-10-CM

## 2020-03-13 NOTE — Therapy (Signed)
[facility] [place], [place], [address] Phone: [phone]   Fax:  [phone]  Physical Therapy Treatment  Patient Details  Name: Heather Fry MRN: [id number] Date of Birth: 09-18-1976 Referring Provider (PT): Orson Eva, MD   Encounter Date: 03/13/2020   PT End of Session - 03/13/20 1438    Visit Number 8    Number of Visits 12    Date for PT Re-Evaluation 03/19/20    Authorization Type Progress note every 10th visit    PT Start Time 1433    PT Stop Time 1514    PT Time Calculation (min) 41 min    Activity Tolerance Patient tolerated treatment well    Behavior During Therapy [facility] for tasks assessed/performed           Past Medical History:  Diagnosis Date  . Anxiety   . Arthritis   . Depression   . Diverticulosis   . GERD (gastroesophageal reflux disease)   . Graves disease   . Internal hemorrhoids   . Thyroid disease   . Uterine fibroid     Past Surgical History:  Procedure Laterality Date  . ABDOMINAL HYSTERECTOMY     Per patient, uterine fibroids.  . ABDOMINAL HYSTERECTOMY    . CHOLECYSTECTOMY N/A 11/06/2014   Procedure: LAPAROSCOPIC CHOLECYSTECTOMY;  Surgeon: Jamesetta So, MD;  Location: [facility];  Service: General;  Laterality: N/A;  . COLONOSCOPY N/A 03/04/2017   Dr. Gala Romney: Hemorrhoids, diverticulosis, benign polyp without adenomatous changes.  Next colonoscopy 10 years  . DILATION AND CURETTAGE OF UTERUS    . ESOPHAGOGASTRODUODENOSCOPY N/A 10/02/2014   Dr. Gala Romney: fundic gland polyps, hiatal hernia  . MOUTH SURGERY     extraction of teeth  . POLYPECTOMY  03/04/2017   Procedure: POLYPECTOMY;  Surgeon: Daneil Dolin, MD;  [facility];  Service: Endoscopy;;  colon  . THYROIDECTOMY N/A 09/30/2018   Procedure: TOTAL THYROIDECTOMY;  Surgeon: Armandina Gemma, MD;  Location: [facility];  Service: General;  Laterality: N/A;  . TUBAL LIGATION      There were no vitals filed for this visit.    Subjective Assessment - 03/13/20 1436    Subjective COVID 19 screening performed on patient upon arrival. Patient states that she was diagnosed with myelopathy per MD and told to continue PT to hold off progression and can walk for 10 minutes per day. Patient states that she has started to have arch pain into calf.    Patient is accompained by: Family member   Daughter   Pertinent History Asthma, Anxiety, depression, diverticulitis, graves disease, thyroidectomy    Limitations Standing;Walking;House hold activities    Diagnostic tests MRI, X-Ray, CT Scan: negative except 3 bulging discs in cervical spine.    Patient Stated Goals improve movement, decrease numbness in arm and leg    Currently in Pain? No/denies              [facility] PT Assessment - 03/13/20 0001      Assessment   Medical Diagnosis right sided numbness    Referring Provider (PT) Orson Eva, MD    Hand Dominance Right    Next MD Visit 04/18/2020 neurologist    Prior Therapy no      Precautions   Precautions Fall      Restrictions   Weight Bearing Restrictions No                         [facility]  Adult PT Treatment/Exercise - 03/13/20 0001      Knee/Hip Exercises: Aerobic   Nustep L1 x10 min      Knee/Hip Exercises: Standing   Hip Abduction AROM;Both;2 sets;10 reps;Knee straight      Knee/Hip Exercises: Seated   Long Arc Quad Strengthening;Both;1 set;10 reps    Long Arc Quad Weight 4 lbs.    Clamshell with Marga Hoots   x20 reps   Other Seated Knee/Hip Exercises B heel/toe raises x20 reps each    Other Seated Knee/Hip Exercises Row, horizontal abduction red theraband x15 reps    Marching AROM;Both;15 reps    Hamstring Curl Strengthening;Both;1 set;10 reps;Limitations    Hamstring Limitations green theraband    Sit to Sand 20 reps;without UE support                       PT Long Term Goals - 03/08/20 1437      PT LONG TERM GOAL #1   Title Patient will be independent with HEP     Time 6    Period Weeks    Status On-going      PT LONG TERM GOAL #2   Title Patient will ambulate 200 feet with rollator and no loss of balance to safely access community.    Time 6    Period Weeks    Status On-going      PT LONG TERM GOAL #3   Title Patient will demonstrate 4/5 or greater bilateral UE and LE MMT to improve stability during functional tasks.    Time 6    Period Weeks    Status On-going      PT LONG TERM GOAL #4   Title Patient will improve right grip strength to  40 lbs of force to improve ability to perform fine motor tasks and ADLs.    Baseline Met 03/08/20 50#    Time 6    Period Weeks    Status Achieved                 Plan - 03/13/20 1529    Clinical Impression Statement Patient presented in clinic with great overall outlook and more capable with exercises from diagnosis. Patient progressed with resistance as well today without complaint. More fatigue with standing exercise today. Great technique with no report of weakness with sit<>stands without UE support. Patient only used SPC while ambulating into clinic but not within gym.    Personal Factors and Comorbidities Comorbidity 3+    Comorbidities Asthma, Anxiety, depression, diverticulitis, graves disease, thyroidectomy    Examination-Activity Limitations Locomotion Level;Transfers;Stand;Stairs;Caring for Others    Stability/Clinical Decision Making Evolving/Moderate complexity    Rehab Potential Fair    PT Frequency 2x / week    PT Duration 6 weeks    PT Treatment/Interventions Cryotherapy;Electrical Stimulation;Moist Heat;ADLs/Self Care Home Management;Gait training;Stair training;Functional mobility training;Therapeutic activities;Therapeutic exercise;Balance training;Manual techniques;Neuromuscular re-education;Passive range of motion;Patient/family education    PT Next Visit Plan cont with POC for UE and LE strengthening to tolerance. Balance activities in sitting and standing.    PT Home Exercise  Plan see patient education section    Consulted and Agree with Plan of Care Patient           Patient will benefit from skilled therapeutic intervention in order to improve the following deficits and impairments:  Decreased activity tolerance, Decreased balance, Difficulty walking, Pain, Impaired UE functional use, Impaired sensation, Decreased strength  Visit Diagnosis: Unsteadiness on feet  Muscle weakness (generalized)  Problem List Patient Active Problem List   Diagnosis Date Noted  . Sensory disturbance 01/20/2020  . Atypical chest pain 01/20/2020  . Obesity, Class III, BMI 40-49.9 (morbid obesity) ([place]) 01/20/2020  . Weakness 01/19/2020  . Hyperglycemia 10/04/2019  . Myalgia 09/05/2019  . Vitamin B 12 deficiency 07/28/2019  . Right sided numbness 06/29/2019  . Constipation 06/15/2019  . Elevated lipase 06/15/2019  . Hypomagnesemia 03/29/2019  . Leukocytosis 03/29/2019  . Abdominal pain 02/10/2019  . Nausea without vomiting 02/10/2019  . Tingling of face 11/30/2018  . Flushing 11/30/2018  . Vitamin D deficiency 10/06/2018  . GERD (gastroesophageal reflux disease) 01/28/2017  . Rectal bleeding 01/28/2017  . 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 Hypothyroidism 04/14/2016  . Gastric polyp   . RUQ abdominal pain 09/06/2014  . Excessive or frequent menstruation 01/13/2013  . Depression     Standley Brooking, PTA 03/13/2020, 3:39 PM  [facility] [place], [place], [address] Phone: [phone]   Fax:  [phone]  Name: RHANDI DESPAIN MRN: [id number] Date of Birth: [birth year]

## 2020-03-14 ENCOUNTER — Encounter: Payer: Self-pay | Admitting: Physical Therapy

## 2020-03-14 ENCOUNTER — Ambulatory Visit: Payer: BC Managed Care – PPO | Admitting: Physical Therapy

## 2020-03-14 DIAGNOSIS — R2681 Unsteadiness on feet: Secondary | ICD-10-CM | POA: Diagnosis not present

## 2020-03-14 DIAGNOSIS — M6281 Muscle weakness (generalized): Secondary | ICD-10-CM | POA: Diagnosis not present

## 2020-03-14 NOTE — Therapy (Signed)
[facility] [place], [place], [address] Phone: [phone]   Fax:  [phone]  Physical Therapy Treatment  Patient Details  Name: Heather Fry MRN: [id number] Date of Birth: May 04, 1976 Referring Provider (PT): Orson Eva, MD   Encounter Date: 03/14/2020   PT End of Session - 03/14/20 1306    Visit Number 9    Number of Visits 12    Date for PT Re-Evaluation 03/19/20    Authorization Type Progress note every 10th visit    PT Start Time 0100    PT Stop Time 0141    PT Time Calculation (min) 41 min    Activity Tolerance Patient tolerated treatment well    Behavior During Therapy [facility] for tasks assessed/performed           Past Medical History:  Diagnosis Date  . Anxiety   . Arthritis   . Depression   . Diverticulosis   . GERD (gastroesophageal reflux disease)   . Graves disease   . Internal hemorrhoids   . Thyroid disease   . Uterine fibroid     Past Surgical History:  Procedure Laterality Date  . ABDOMINAL HYSTERECTOMY     Per patient, uterine fibroids.  . ABDOMINAL HYSTERECTOMY    . CHOLECYSTECTOMY N/A 11/06/2014   Procedure: LAPAROSCOPIC CHOLECYSTECTOMY;  Surgeon: Jamesetta So, MD;  Location: [facility];  Service: General;  Laterality: N/A;  . COLONOSCOPY N/A 03/04/2017   Dr. Gala Romney: Hemorrhoids, diverticulosis, benign polyp without adenomatous changes.  Next colonoscopy 10 years  . DILATION AND CURETTAGE OF UTERUS    . ESOPHAGOGASTRODUODENOSCOPY N/A 10/02/2014   Dr. Gala Romney: fundic gland polyps, hiatal hernia  . MOUTH SURGERY     extraction of teeth  . POLYPECTOMY  03/04/2017   Procedure: POLYPECTOMY;  Surgeon: Daneil Dolin, MD;  Location: [facility];  Service: Endoscopy;;  colon  . THYROIDECTOMY N/A 09/30/2018   Procedure: TOTAL THYROIDECTOMY;  Surgeon: Armandina Gemma, MD;  Location: [facility];  Service: General;  Laterality: N/A;  . TUBAL LIGATION      There were no vitals filed for this visit.    Subjective Assessment - 03/14/20 1301    Subjective COVID 19 screening performed on patient upon arrival. Patient arrived and feels "wobbly" today    Patient is accompained by: Family member    Pertinent History Asthma, Anxiety, depression, diverticulitis, graves disease, thyroidectomy    Limitations Standing;Walking;House hold activities    Diagnostic tests MRI, X-Ray, CT Scan: negative except 3 bulging discs in cervical spine.    Patient Stated Goals improve movement, decrease numbness in arm and leg    Currently in Pain? No/denies                             [facility] Adult PT Treatment/Exercise - 03/14/20 0001      Knee/Hip Exercises: Aerobic   Nustep L2 x75mn UE/LE activity      Knee/Hip Exercises: Machines for Strengthening   Cybex Knee Extension 10# x10    Cybex Knee Flexion 20# 2x10      Knee/Hip Exercises: Standing   Heel Raises Both;1 set;10 reps    Forward Step Up Both;2 sets;10 reps;Step Height: 6";Hand Hold: 2      Knee/Hip Exercises: Seated   Clamshell with TheraBand Green   x20   Other Seated Knee/Hip Exercises B heel/toe raises x20 reps each    Marching AROM;Both;15 reps    Sit to  Sand 10 reps;without UE support                       PT Long Term Goals - 03/14/20 1346      PT LONG TERM GOAL #1   Title Patient will be independent with HEP    Time 6    Period Weeks    Status On-going      PT LONG TERM GOAL #2   Title Patient will ambulate 200 feet with rollator and no loss of balance to safely access community.    Time 6    Period Weeks    Status On-going      PT LONG TERM GOAL #3   Title Patient will demonstrate 4/5 or greater bilateral UE and LE MMT to improve stability during functional tasks.    Time 6    Period Weeks    Status On-going      PT LONG TERM GOAL #4   Title Patient will improve right grip strength to  40 lbs of force to improve ability to perform fine motor tasks and ADLs.    Baseline Met 03/08/20 50#     Time 6    Period Weeks    Status Achieved                 Plan - 03/14/20 1343    Clinical Impression Statement Patient tolerated treatment well today. Patient arrived feeling "wobbly" then reported feeling better after treatment. Patient able to progress with standing exercises and PRE's for LE's. Patient requires rest inbetween activities. Patient able to perfrom ADL's with greater ease. Patient current goals are progressing due to endurance and strength limitations.    Personal Factors and Comorbidities Comorbidity 3+    Comorbidities Asthma, Anxiety, depression, diverticulitis, graves disease, thyroidectomy    Examination-Activity Limitations Locomotion Level;Transfers;Stand;Stairs;Caring for Others    Stability/Clinical Decision Making Evolving/Moderate complexity    Rehab Potential Fair    PT Frequency 2x / week    PT Duration 6 weeks    PT Treatment/Interventions Cryotherapy;Electrical Stimulation;Moist Heat;ADLs/Self Care Home Management;Gait training;Stair training;Functional mobility training;Therapeutic activities;Therapeutic exercise;Balance training;Manual techniques;Neuromuscular re-education;Passive range of motion;Patient/family education    PT Next Visit Plan cont with POC for UE and LE strengthening to tolerance. Balance activities in sitting and standing.    Consulted and Agree with Plan of Care Patient           Patient will benefit from skilled therapeutic intervention in order to improve the following deficits and impairments:  Decreased activity tolerance, Decreased balance, Difficulty walking, Pain, Impaired UE functional use, Impaired sensation, Decreased strength  Visit Diagnosis: Unsteadiness on feet  Muscle weakness (generalized)     Problem List Patient Active Problem List   Diagnosis Date Noted  . Sensory disturbance 01/20/2020  . Atypical chest pain 01/20/2020  . Obesity, Class III, BMI 40-49.9 (morbid obesity) (HCC) 01/20/2020  . Weakness  01/19/2020  . Hyperglycemia 10/04/2019  . Myalgia 09/05/2019  . Vitamin B 12 deficiency 07/28/2019  . Right sided numbness 06/29/2019  . Constipation 06/15/2019  . Elevated lipase 06/15/2019  . Hypomagnesemia 03/29/2019  . Leukocytosis 03/29/2019  . Abdominal pain 02/10/2019  . Nausea without vomiting 02/10/2019  . Tingling of face 11/30/2018  . Flushing 11/30/2018  . Vitamin D deficiency 10/06/2018  . GERD (gastroesophageal reflux disease) 01/28/2017  . Rectal bleeding 01/28/2017  . 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 Hypothyroidism 04/14/2016  . Gastric polyp   . RUQ abdominal pain 09/06/2014  .   Excessive or frequent menstruation 01/13/2013  . Depression     Nilo Fallin P, PTA 03/14/2020, 1:47 PM  [facility] [address] [place], [place], [address] Phone: [phone]   Fax:  [phone]  Name: KARRIGAN MESSAMORE MRN: [id number] Date of Birth: 03/02/1976

## 2020-03-15 DIAGNOSIS — J329 Chronic sinusitis, unspecified: Secondary | ICD-10-CM | POA: Diagnosis not present

## 2020-03-15 DIAGNOSIS — R490 Dysphonia: Secondary | ICD-10-CM | POA: Diagnosis not present

## 2020-03-15 DIAGNOSIS — J9801 Acute bronchospasm: Secondary | ICD-10-CM | POA: Diagnosis not present

## 2020-03-15 DIAGNOSIS — J45901 Unspecified asthma with (acute) exacerbation: Secondary | ICD-10-CM | POA: Diagnosis not present

## 2020-03-20 ENCOUNTER — Encounter: Payer: Self-pay | Admitting: Physical Therapy

## 2020-03-20 ENCOUNTER — Ambulatory Visit: Payer: BC Managed Care – PPO | Admitting: Physical Therapy

## 2020-03-20 ENCOUNTER — Other Ambulatory Visit: Payer: Self-pay

## 2020-03-20 DIAGNOSIS — R2681 Unsteadiness on feet: Secondary | ICD-10-CM | POA: Diagnosis not present

## 2020-03-20 DIAGNOSIS — M6281 Muscle weakness (generalized): Secondary | ICD-10-CM

## 2020-03-20 NOTE — Therapy (Signed)
[facility] [place], [place], [address] Phone: [phone]   Fax:  [phone]  Physical Therapy Treatment  Patient Details  Name: Heather Fry MRN: [id number] Date of Birth: Oct 27, 1975 Referring Provider (PT): Orson Eva, MD   Encounter Date: 03/20/2020   PT End of Session - 03/20/20 1305    Visit Number 10    Number of Visits 12    Date for PT Re-Evaluation 03/19/20    Authorization Type Progress note every 10th visit    PT Start Time 1301    PT Stop Time 1343    PT Time Calculation (min) 42 min    Activity Tolerance Patient tolerated treatment well    Behavior During Therapy [facility] for tasks assessed/performed           Past Medical History:  Diagnosis Date   Anxiety    Arthritis    Depression    Diverticulosis    GERD (gastroesophageal reflux disease)    Graves disease    Internal hemorrhoids    Thyroid disease    Uterine fibroid     Past Surgical History:  Procedure Laterality Date   ABDOMINAL HYSTERECTOMY     Per patient, uterine fibroids.   ABDOMINAL HYSTERECTOMY     CHOLECYSTECTOMY N/A 11/06/2014   Procedure: LAPAROSCOPIC CHOLECYSTECTOMY;  Surgeon: Jamesetta So, MD;  Location: [facility];  Service: General;  Laterality: N/A;   COLONOSCOPY N/A 03/04/2017   Dr. Gala Romney: Hemorrhoids, diverticulosis, benign polyp without adenomatous changes.  Next colonoscopy 10 years   DILATION AND CURETTAGE OF UTERUS     ESOPHAGOGASTRODUODENOSCOPY N/A 10/02/2014   Dr. Gala Romney: fundic gland polyps, hiatal hernia   MOUTH SURGERY     extraction of teeth   POLYPECTOMY  03/04/2017   Procedure: POLYPECTOMY;  Surgeon: Daneil Dolin, MD;  Location: [facility];  Service: Endoscopy;;  colon   THYROIDECTOMY N/A 09/30/2018   Procedure: TOTAL THYROIDECTOMY;  Surgeon: Armandina Gemma, MD;  Location: [facility];  Service: General;  Laterality: N/A;   TUBAL LIGATION      There were no vitals filed for this visit.    Subjective Assessment - 03/20/20 1303    Subjective COVID 19 screening performed on patient upon arrival. Patient reports that she is starting to feel a little more like herself and correlates that to knowing more about her condition.    Pertinent History Asthma, Anxiety, depression, diverticulitis, graves disease, thyroidectomy    Limitations Standing;Walking;House hold activities    Diagnostic tests MRI, X-Ray, CT Scan: negative except 3 bulging discs in cervical spine.    Patient Stated Goals improve movement, decrease numbness in arm and leg    Currently in Pain? No/denies              [facility] PT Assessment - 03/20/20 0001      Assessment   Medical Diagnosis right sided numbness    Referring Provider (PT) Orson Eva, MD    Hand Dominance Right    Next MD Visit 04/18/2020 neurologist    Prior Therapy no      Precautions   Precautions Fall      Restrictions   Weight Bearing Restrictions No                         [facility] Adult PT Treatment/Exercise - 03/20/20 0001      Knee/Hip Exercises: Aerobic   Nustep L4 x62mn UE/LE activity      Knee/Hip Exercises:  Standing   Hip Abduction AROM;Both;20 reps;Knee straight    Forward Step Up Both;15 reps;Hand Hold: 2;Step Height: 4"      Knee/Hip Exercises: Seated   Long Arc Quad Strengthening;Both;15 reps;Weights    Long Arc Quad Weight 4 lbs.    Clamshell with TheraBand Red   x30 reps   Hamstring Curl Strengthening;Both;20 reps;Limitations    Hamstring Limitations red theraband    Sit to Sand 15 reps;without UE support                       PT Long Term Goals - 03/20/20 1416      PT LONG TERM GOAL #1   Title Patient will be independent with HEP    Time 6    Period Weeks    Status Achieved      PT LONG TERM GOAL #2   Title Patient will ambulate 200 feet with rollator and no loss of balance to safely access community.    Time 6    Period Weeks    Status On-going      PT LONG TERM GOAL #3    Title Patient will demonstrate 4/5 or greater bilateral UE and LE MMT to improve stability during functional tasks.    Time 6    Period Weeks    Status On-going      PT LONG TERM GOAL #4   Title Patient will improve right grip strength to  40 lbs of force to improve ability to perform fine motor tasks and ADLs.    Baseline Met 03/08/20 50#    Time 6    Period Weeks    Status Achieved                 Plan - 03/20/20 1417    Clinical Impression Statement Patient presented in clinic with reports of more independence since diagnosis. Patient able to tolerate therex with caution to not overdo exercise. Patient noting more strength with ADLs and at home and more confident.    Personal Factors and Comorbidities Comorbidity 3+    Comorbidities Asthma, Anxiety, depression, diverticulitis, graves disease, thyroidectomy    Examination-Activity Limitations Locomotion Level;Transfers;Stand;Stairs;Caring for Others    Stability/Clinical Decision Making Evolving/Moderate complexity    Rehab Potential Fair    PT Frequency 2x / week    PT Duration 6 weeks    PT Treatment/Interventions Cryotherapy;Electrical Stimulation;Moist Heat;ADLs/Self Care Home Management;Gait training;Stair training;Functional mobility training;Therapeutic activities;Therapeutic exercise;Balance training;Manual techniques;Neuromuscular re-education;Passive range of motion;Patient/family education    PT Next Visit Plan cont with POC for UE and LE strengthening to tolerance. Balance activities in sitting and standing.    PT Home Exercise Plan see patient education section    Consulted and Agree with Plan of Care Patient           Patient will benefit from skilled therapeutic intervention in order to improve the following deficits and impairments:  Decreased activity tolerance, Decreased balance, Difficulty walking, Pain, Impaired UE functional use, Impaired sensation, Decreased strength  Visit Diagnosis: Unsteadiness on  feet  Muscle weakness (generalized)     Problem List Patient Active Problem List   Diagnosis Date Noted   Sensory disturbance 01/20/2020   Atypical chest pain 01/20/2020   Obesity, Class III, BMI 40-49.9 (morbid obesity) ([facility]) 01/20/2020   Weakness 01/19/2020   Hyperglycemia 10/04/2019   Myalgia 09/05/2019   Vitamin B 12 deficiency 07/28/2019   Right sided numbness 06/29/2019   Constipation 06/15/2019   Elevated lipase 06/15/2019  Hypomagnesemia 03/29/2019   Leukocytosis 03/29/2019   Abdominal pain 02/10/2019   Nausea without vomiting 02/10/2019   Tingling of face 11/30/2018   Flushing 11/30/2018   Vitamin D deficiency 10/06/2018   GERD (gastroesophageal reflux disease) 01/28/2017   Rectal bleeding 01/28/2017   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   Hypothyroidism 04/14/2016   Gastric polyp    RUQ abdominal pain 09/06/2014   Excessive or frequent menstruation 01/13/2013   Depression     Standley Brooking, PTA 03/20/2020, 2:45 PM  [facility] [address] [place], [place], [address] Phone: [phone]   Fax:  [phone]  Name: LUCYLLE FOULKES MRN: [id number] Date of Birth: [birth year]

## 2020-03-22 ENCOUNTER — Encounter: Payer: BC Managed Care – PPO | Admitting: Physical Therapy

## 2020-03-23 ENCOUNTER — Other Ambulatory Visit (HOSPITAL_COMMUNITY)
Admission: RE | Admit: 2020-03-23 | Discharge: 2020-03-23 | Disposition: A | Discharge status: Home / Self Care | Payer: BC Managed Care – PPO | Source: Ambulatory Visit | Attending: Critical Care Medicine | Admitting: Critical Care Medicine

## 2020-03-23 NOTE — Progress Notes (Signed)
Patient's Mother-in Heather Fry was in a car accident yesterday. She forgot to call and cancel her appointment. I reminded her to please call her Dr's office to cancel her PFT when she had a chance. Nothing further needed

## 2020-03-27 ENCOUNTER — Ambulatory Visit: Payer: BC Managed Care – PPO | Admitting: Critical Care Medicine

## 2020-03-27 NOTE — Progress Notes (Deleted)
Synopsis: Referred in March 2021 for shortness of breath by Redmond School, MD  Subjective:   PATIENT ID: Heather Fry GENDER: female DOB: 02-May-1976, MRN: [id number]  No chief complaint on file.   HPI  PFTs today  Added Spiriva at last visit Symbicort twice daily Montelukast Zyrtec Flonase   OV 01/05/20: Heather Fry is a 44 year old woman with a history of recently diagnosed severe asthma.  She remains on montelukast, Zyrtec, Flonase, Symbicort twice daily.  Overall her symptoms have improved since her last visit, but last week she had a sudden episode of shortness of breath associated with being hot and having high blood pressure.  She thinks she was maybe triggered by the baby chickens they have living in their house.  She took her Symbicort and rested for a while, and after about an hour she could breathe better.  She had more sputum production and a raspy voice with coughing during this episode.  Last Fry she had a similar episode where she had tightness in her chest with shortness of breath and improved after using her albuterol.  She has an air purifier and has stayed in her room more to avoid allergens.  They have multiple pets, including reptiles, chickens live in the house currently, and outdoor cats.  She is no longer around her husband when he is vaping.  She has remained on steroids per her rheumatologist at [facility] since her last visit.  She is being evaluated for SLE.  She is more fatigued than normal for the past few weeks.  No significant nighttime symptoms; still using her CPAP.  She uses this more when she is short of breath with relief of her symptoms.   OV 12/13/19: Heather Fry is a 44 y/o woman with a history of Grave's disease s/p resection who was recently diagnosed with asthma in the ED 2 at [facility] on 12/04/19 who presents for evaluation.  She has a history of chronic allergies which have worsened over time.  She began having breathing symptoms several weeks  ago which progressively worsened.  She has nasal congestion, rhinorrhea, hoarseness, wheezing, severe chest tightness all the way around her chest, dyspnea on exertion, cough, thick sputum production.  Her symptoms are worse when she is hot.  Her husband vaping vapors worsen her symptoms.  She has well-controlled GERD on omeprazole.  She was previously taking Claritin.  When she presented to the ED a few weeks ago she was having laryngeal spasms at the same time.  She currently is taking albuterol multiple times per day with relief in her symptoms.  Prior to her ED visit she was taking Primatene.  When her breathing symptoms were originally being evaluated her endocrinologist checked a sed rate, which was increased.  At that time she was referred to [facility] rheumatology and was seen by Garen Grams.  At that time she was diagnosed with mixed connective tissue disease and started on Medrol dose pack.  She was subsequently started on Plaquenil, which had to be stopped due to an adverse reaction.  The diagnosis of mixed connective tissue disease is now in question and she has been tapered off steroids.  Tapering off steroids is what precipitated the asthma attack as her breathing continues to worsen as the steroids are decreased.  She has an appointment at [facility] with rheumatology in April for evaluation.  Is previously undergone cardiac work-up, which was negative.  She has severe OSA which is treated with autoPAP.  After her thyroidectomy  she has had significant weight gain issues, and she would like to lose the excess weight she has gained.  She has a history of tobacco abuse in the past.  She urgently quit smoking 1999, but started again about 2 years ago her total 3 months prior to quitting permanently.  She has a family history of asthma in her paternal uncle.  ACT 11    Past Medical History:  Diagnosis Date  . Anxiety   . Arthritis   . Depression   . Diverticulosis   . GERD (gastroesophageal  reflux disease)   . Graves disease   . Internal hemorrhoids   . Thyroid disease   . Uterine fibroid      Family History  Problem Relation Age of Onset  . Hypertension Father   . Diabetes Father   . Depression Other   . Uterine cancer Mother        ?cervical cancer  . Uterine cancer Sister        suicide  . Colon cancer Paternal Aunt 8  . Thyroid disease Neg Hx      Past Surgical History:  Procedure Laterality Date  . ABDOMINAL HYSTERECTOMY     Per patient, uterine fibroids.  . ABDOMINAL HYSTERECTOMY    . CHOLECYSTECTOMY N/A 11/06/2014   Procedure: LAPAROSCOPIC CHOLECYSTECTOMY;  Surgeon: Jamesetta So, MD;  Location: [facility] ORS;  Service: General;  Laterality: N/A;  . COLONOSCOPY N/A 03/04/2017   Dr. Gala Romney: Hemorrhoids, diverticulosis, benign polyp without adenomatous changes.  Next colonoscopy 10 years  . DILATION AND CURETTAGE OF UTERUS    . ESOPHAGOGASTRODUODENOSCOPY N/A 10/02/2014   Dr. Gala Romney: fundic gland polyps, hiatal hernia  . MOUTH SURGERY     extraction of teeth  . POLYPECTOMY  03/04/2017   Procedure: POLYPECTOMY;  Surgeon: Daneil Dolin, MD;  Location: [facility] ENDO [facility]ITE;  Service: Endoscopy;;  colon  . THYROIDECTOMY N/A 09/30/2018   Procedure: TOTAL THYROIDECTOMY;  Surgeon: Armandina Gemma, MD;  Location: [facility] ORS;  Service: General;  Laterality: N/A;  . TUBAL LIGATION      Social History   Socioeconomic History  . Marital status: Married    Spouse name: Not on file  . Number of children: Not on file  . Years of education: Not on file  . Highest education level: Not on file  Occupational History  . Not on file  Tobacco Use  . Smoking status: Former Smoker    Packs/day: 1.00    Years: 5.00    Pack years: 5.00    Types: Cigarettes    Quit date: 11/03/1997    Years since quitting: 22.4  . Smokeless tobacco: Never Used  . Tobacco comment: 1 pack 1 week  Vaping Use  . Vaping Use: Never used  Substance and Sexual Activity  . Alcohol use: No    Alcohol/week: 0.0  standard drinks  . Drug use: No  . Sexual activity: Yes    Birth control/protection: Surgical  Other Topics Concern  . Not on file  Social History Narrative  . Not on file   Social Determinants of Health   Financial Resource Strain:   . Difficulty of Paying Living Expenses:   Food Insecurity:   . Worried About Charity fundraiser in the Last Year:   . Arboriculturist in the Last Year:   Transportation Needs:   . Film/video editor (Medical):   Marland Kitchen Lack of Transportation (Non-Medical):   Physical Activity:   . Days of Exercise  per Week:   . Minutes of Exercise per Session:   Stress:   . Feeling of Stress :   Social Connections:   . Frequency of Communication with Friends and Family:   . Frequency of Social Gatherings with Friends and Family:   . Attends Religious Services:   . Active Member of Clubs or Organizations:   . Attends Archivist Meetings:   Marland Kitchen Marital Status:   Intimate Partner Violence:   . Fear of Current or Ex-Partner:   . Emotionally Abused:   Marland Kitchen Physically Abused:   . Sexually Abused:      Allergies  Allergen Reactions  . Levofloxacin Other (See Comments)    Pt can not have due to taking Lexapro   . Toradol [Ketorolac Tromethamine] Anaphylaxis and Hives  . Penicillins Other (See Comments)    Loopy, childhood allergy       Immunization History  Administered Date(s) Administered  . Influenza-Unspecified 07/08/2019  . Pneumococcal Conjugate-13 07/07/2018    Outpatient Medications Prior to Visit  Medication Sig Dispense Refill  . acetaminophen (TYLENOL) 500 MG tablet Take 1,000 mg by mouth daily as needed for moderate pain.    Marland Kitchen albuterol (VENTOLIN HFA) 108 (90 Base) MCG/ACT inhaler Inhale 2 puffs into the lungs every 6 (six) hours as needed. 18 g 11  . budesonide-formoterol (SYMBICORT) 160-4.5 MCG/ACT inhaler Inhale 2 puffs into the lungs in the morning and at bedtime. 1 Inhaler 11  . cetirizine (ZYRTEC ALLERGY) 10 MG tablet Take 1  tablet (10 mg total) by mouth daily. (Patient taking differently: Take 10 mg by mouth at bedtime. ) 30 tablet 11  . Cholecalciferol (VITAMIN D3) 125 MCG (5000 UT) CAPS Take 1 capsule (5,000 Units total) by mouth daily. (Patient taking differently: Take 10,000 Units by mouth at bedtime. ) 30 capsule 11  . docusate sodium (COLACE) 100 MG capsule Take 100 mg by mouth at bedtime.    Marland Kitchen escitalopram (LEXAPRO) 20 MG tablet Take 1 tablet (20 mg total) by mouth at bedtime. 30 tablet 5  . fluticasone (FLONASE) 50 MCG/ACT nasal spray Place 2 sprays into both nostrils daily. (Patient taking differently: Place 2 sprays into both nostrils at bedtime. ) 16 g 2  . gabapentin (NEURONTIN) 300 MG capsule Take 1 capsule (300 mg total) by mouth 2 (two) times daily. (Patient not taking: Reported on 01/30/2020) 60 capsule 1  . Magnesium 250 MG TABS Take 250 mg by mouth at bedtime.     . methylPREDNISolone (MEDROL) 4 MG tablet Take 4 mg by mouth in the morning and at bedtime.    . montelukast (SINGULAIR) 10 MG tablet Take 1 tablet (10 mg total) by mouth at bedtime. 30 tablet 11  . NON FORMULARY at bedtime. CPAP at Fry     . omeprazole (PRILOSEC) 20 MG capsule Take 20 mg by mouth 2 (two) times daily before a meal.    . prednisoLONE acetate (PRED FORTE) 1 % ophthalmic suspension Place 1 drop into both eyes daily.    . RESTASIS 0.05 % ophthalmic emulsion Place 1 drop into both eyes 2 (two) times daily.    . simethicone (PHAZYME) 125 MG chewable tablet Chew 125-250 mg by mouth every 6 (six) hours as needed for flatulence.     . sucralfate (CARAFATE) 1 g tablet Take 1 g by mouth 4 (four) times daily.    Marland Kitchen SYNTHROID 150 MCG tablet Take 1 tablet (150 mcg total) by mouth daily before breakfast. 90 tablet 3  .  Tiotropium Bromide Monohydrate (SPIRIVA RESPIMAT) 1.25 MCG/ACT AERS Inhale 2 puffs into the lungs daily. 4 g 11  . vitamin B-12 (CYANOCOBALAMIN) 1000 MCG tablet Take 1,000 mcg by mouth at bedtime.    . Wheat Dextrin  (BENEFIBER DRINK MIX PO) Take 1 Dose by mouth 3 (three) times daily.      No facility-administered medications prior to visit.    Review of Systems  Constitutional: Negative.   HENT: Positive for congestion.   Respiratory: Positive for cough, sputum production, shortness of breath and wheezing.        Chest tightness  Cardiovascular: Negative for chest pain and leg swelling.  Gastrointestinal: Negative for heartburn.  Endo/Heme/Allergies: Positive for environmental allergies.     Objective:   There were no vitals filed for this visit.   on   RA BMI Readings from Last 3 Encounters:  01/19/20 45.64 kg/m  01/05/20 45.83 kg/m  12/28/19 46.59 kg/m   Wt Readings from Last 3 Encounters:  01/19/20 265 lb 14 oz (120.6 kg)  01/05/20 267 lb (121.1 kg)  12/28/19 271 lb 6.4 oz (123.1 kg)    Physical Exam   CBC    Component Value Date/Time   WBC 10.9 (H) 01/19/2020 1957   RBC 4.51 01/19/2020 1957   HGB 13.3 01/19/2020 2017   HCT 39.0 01/19/2020 2017   PLT 268 01/19/2020 1957   MCV 90.0 01/19/2020 1957   MCH 29.3 01/19/2020 1957   MCHC 32.5 01/19/2020 1957   RDW 14.5 01/19/2020 1957   LYMPHSABS 2.2 01/19/2020 1957   MONOABS 0.5 01/19/2020 1957   EOSABS 0.1 01/19/2020 1957   BASOSABS 0.0 01/19/2020 1957    CHEMISTRY No results for input(s): NA, K, CL, CO2, GLUCOSE, BUN, CREATININE, CALCIUM, MG, PHOS in the last 168 hours. CrCl cannot be calculated (Patient's most recent lab result is older than the maximum 21 days allowed.).  D-dimer 0.28 on 12/04/2019 BNP 24 on 12/04/2019  Chest Imaging- films reviewed: CXR, 1 view 12/04/2019-low lung volumes likely contributing to the appearance of widened mediastinum and cardiomegaly  CT abdomen pelvis 04/07/2019-lung images reviewed-possible reticular changes of dependent bilateral lower lobes.  Pulmonary Functions Testing Results: No flowsheet data found.    Echocardiogram 20/2021: LVEF 60 to 65%, no LVH.  Function.  Mildly  dilated LA, normal pressure.  Normal RV, RA.  Trivial MR.      Assessment & Plan:   No diagnosis found.  Severe persistent asthma; suspect allergic asthma. -We will defer checking CBC with differential and IgE until she is off steroids. -Start Spiriva once daily.  Continue Symbicort twice daily -PFTs scheduled for early May.  She has follow-up the same day.  Strongly considering starting a biologic if PFTs confirm asthma. -Continue allergic rhinosinusitis management. -Strongly recommend moving chickens outside of the house and other pets that could be worsening her allergies. -Continue to avoid other environmental triggers such as vaping and dust. -Continue albuterol as needed  Allergic rhinosinusitis -Continue Zyrtec and Singulair daily -Continue Flonase daily  Obesity -Recommend modest weight loss   RTC in 1 month with PFTs.   Current Outpatient Medications:  .  acetaminophen (TYLENOL) 500 MG tablet, Take 1,000 mg by mouth daily as needed for moderate pain., Disp: , Rfl:  .  albuterol (VENTOLIN HFA) 108 (90 Base) MCG/ACT inhaler, Inhale 2 puffs into the lungs every 6 (six) hours as needed., Disp: 18 g, Rfl: 11 .  budesonide-formoterol (SYMBICORT) 160-4.5 MCG/ACT inhaler, Inhale 2 puffs into the lungs in the morning  and at bedtime., Disp: 1 Inhaler, Rfl: 11 .  cetirizine (ZYRTEC ALLERGY) 10 MG tablet, Take 1 tablet (10 mg total) by mouth daily. (Patient taking differently: Take 10 mg by mouth at bedtime. ), Disp: 30 tablet, Rfl: 11 .  Cholecalciferol (VITAMIN D3) 125 MCG (5000 UT) CAPS, Take 1 capsule (5,000 Units total) by mouth daily. (Patient taking differently: Take 10,000 Units by mouth at bedtime. ), Disp: 30 capsule, Rfl: 11 .  docusate sodium (COLACE) 100 MG capsule, Take 100 mg by mouth at bedtime., Disp: , Rfl:  .  escitalopram (LEXAPRO) 20 MG tablet, Take 1 tablet (20 mg total) by mouth at bedtime., Disp: 30 tablet, Rfl: 5 .  fluticasone (FLONASE) 50 MCG/ACT nasal  spray, Place 2 sprays into both nostrils daily. (Patient taking differently: Place 2 sprays into both nostrils at bedtime. ), Disp: 16 g, Rfl: 2 .  gabapentin (NEURONTIN) 300 MG capsule, Take 1 capsule (300 mg total) by mouth 2 (two) times daily. (Patient not taking: Reported on 01/30/2020), Disp: 60 capsule, Rfl: 1 .  Magnesium 250 MG TABS, Take 250 mg by mouth at bedtime. , Disp: , Rfl:  .  methylPREDNISolone (MEDROL) 4 MG tablet, Take 4 mg by mouth in the morning and at bedtime., Disp: , Rfl:  .  montelukast (SINGULAIR) 10 MG tablet, Take 1 tablet (10 mg total) by mouth at bedtime., Disp: 30 tablet, Rfl: 11 .  NON FORMULARY, at bedtime. CPAP at [facility] , Disp: , Rfl:  .  omeprazole (PRILOSEC) 20 MG capsule, Take 20 mg by mouth 2 (two) times daily before a meal., Disp: , Rfl:  .  prednisoLONE acetate (PRED FORTE) 1 % ophthalmic suspension, Place 1 drop into both eyes daily., Disp: , Rfl:  .  RESTASIS 0.05 % ophthalmic emulsion, Place 1 drop into both eyes 2 (two) times daily., Disp: , Rfl:  .  simethicone (PHAZYME) 125 MG chewable tablet, Chew 125-250 mg by mouth every 6 (six) hours as needed for flatulence. , Disp: , Rfl:  .  sucralfate (CARAFATE) 1 g tablet, Take 1 g by mouth 4 (four) times daily., Disp: , Rfl:  .  SYNTHROID 150 MCG tablet, Take 1 tablet (150 mcg total) by mouth daily before breakfast., Disp: 90 tablet, Rfl: 3 .  Tiotropium Bromide Monohydrate (SPIRIVA RESPIMAT) 1.25 MCG/ACT AERS, Inhale 2 puffs into the lungs daily., Disp: 4 g, Rfl: 11 .  vitamin B-12 (CYANOCOBALAMIN) 1000 MCG tablet, Take 1,000 mcg by mouth at bedtime., Disp: , Rfl:  .  Wheat Dextrin (BENEFIBER DRINK MIX PO), Take 1 Dose by mouth 3 (three) times daily. , Disp: , Rfl:      Julian Hy, DO [facility] 03/27/2020 8:49 AM

## 2020-03-28 ENCOUNTER — Encounter: Payer: Self-pay | Admitting: Physical Therapy

## 2020-03-28 ENCOUNTER — Other Ambulatory Visit: Payer: Self-pay

## 2020-03-28 ENCOUNTER — Ambulatory Visit: Payer: BC Managed Care – PPO | Admitting: Physical Therapy

## 2020-03-28 DIAGNOSIS — M6281 Muscle weakness (generalized): Secondary | ICD-10-CM

## 2020-03-28 DIAGNOSIS — R2681 Unsteadiness on feet: Secondary | ICD-10-CM

## 2020-03-28 DIAGNOSIS — M9902 Segmental and somatic dysfunction of thoracic region: Secondary | ICD-10-CM | POA: Diagnosis not present

## 2020-03-28 DIAGNOSIS — M62830 Muscle spasm of back: Secondary | ICD-10-CM | POA: Diagnosis not present

## 2020-03-28 DIAGNOSIS — M9901 Segmental and somatic dysfunction of cervical region: Secondary | ICD-10-CM | POA: Diagnosis not present

## 2020-03-28 DIAGNOSIS — M9903 Segmental and somatic dysfunction of lumbar region: Secondary | ICD-10-CM | POA: Diagnosis not present

## 2020-03-28 NOTE — Therapy (Signed)
[facility] [place], [place], [address] Phone: [phone]   Fax:  [phone]  Physical Therapy Treatment  Patient Details  Name: Heather Fry MRN: [id number] Date of Birth: 04/19/1976 Referring Provider (PT): Orson Eva, MD   Encounter Date: 03/28/2020   PT End of Session - 03/28/20 1358    Visit Number 11    Number of Visits 12    Date for PT Re-Evaluation 03/19/20    Authorization Type Progress note every 10th visit    PT Start Time 1326    PT Stop Time 1354    PT Time Calculation (min) 28 min    Activity Tolerance Patient tolerated treatment well    Behavior During Therapy [facility] for tasks assessed/performed           Past Medical History:  Diagnosis Date  . Anxiety   . Arthritis   . Depression   . Diverticulosis   . GERD (gastroesophageal reflux disease)   . Graves disease   . Internal hemorrhoids   . Thyroid disease   . Uterine fibroid     Past Surgical History:  Procedure Laterality Date  . ABDOMINAL HYSTERECTOMY     Per patient, uterine fibroids.  . ABDOMINAL HYSTERECTOMY    . CHOLECYSTECTOMY N/A 11/06/2014   Procedure: LAPAROSCOPIC CHOLECYSTECTOMY;  Surgeon: Jamesetta So, MD;  Location: [facility] ORS;  Service: General;  Laterality: N/A;  . COLONOSCOPY N/A 03/04/2017   Dr. Gala Romney: Hemorrhoids, diverticulosis, benign polyp without adenomatous changes.  Next colonoscopy 10 years  . DILATION AND CURETTAGE OF UTERUS    . ESOPHAGOGASTRODUODENOSCOPY N/A 10/02/2014   Dr. Gala Romney: fundic gland polyps, hiatal hernia  . MOUTH SURGERY     extraction of teeth  . POLYPECTOMY  03/04/2017   Procedure: POLYPECTOMY;  Surgeon: Daneil Dolin, MD;  Location: [facility];  Service: Endoscopy;;  colon  . THYROIDECTOMY N/A 09/30/2018   Procedure: TOTAL THYROIDECTOMY;  Surgeon: Armandina Gemma, MD;  Location: [facility] ORS;  Service: General;  Laterality: N/A;  . TUBAL LIGATION      There were no vitals filed for this visit.    Subjective Assessment - 03/28/20 1356    Subjective COVID 19 screening performed on patient upon arrival. Reports she has had a lot going on recently but has tried to do exercises as best she could. A lot of stress sent her into a flare up recently..    Pertinent History Asthma, Anxiety, depression, diverticulitis, graves disease, thyroidectomy    Limitations Standing;Walking;House hold activities    Diagnostic tests MRI, X-Ray, CT Scan: negative except 3 bulging discs in cervical spine.    Patient Stated Goals improve movement, decrease numbness in arm and leg    Currently in Pain? No/denies    Pain Score --    Pain Location --    Pain Orientation --    Pain Descriptors / Indicators --    Pain Type --    Pain Onset --    Pain Frequency --              [facility] PT Assessment - 03/28/20 0001      Assessment   Medical Diagnosis right sided numbness    Referring Provider (PT) Orson Eva, MD    Hand Dominance Right    Next MD Visit 04/18/2020 neurologist    Prior Therapy no      Precautions   Precautions Fall      Restrictions   Weight Bearing Restrictions No  [facility] Adult PT Treatment/Exercise - 03/28/20 0001      Knee/Hip Exercises: Aerobic   Nustep L4 x10 min      Knee/Hip Exercises: Seated   Long Arc Quad Strengthening;Both;15 reps;Weights    Long Arc Quad Weight 4 lbs.    Long [employer] Limitations with ball squeeze    [employer] x15 reps    Clamshell with TheraBand Red   x20 reps   Hamstring Curl Strengthening;Both;20 reps;Limitations    Hamstring Limitations red theraband                       PT Long Term Goals - 03/20/20 1416      PT LONG TERM GOAL #1   Title Patient will be independent with HEP    Time 6    Period Weeks    Status Achieved      PT LONG TERM GOAL #2   Title Patient will ambulate 200 feet with rollator and no loss of balance to safely access community.    Time 6    Period Weeks    Status  On-going      PT LONG TERM GOAL #3   Title Patient will demonstrate 4/5 or greater bilateral UE and LE MMT to improve stability during functional tasks.    Time 6    Period Weeks    Status On-going      PT LONG TERM GOAL #4   Title Patient will improve right grip strength to  40 lbs of force to improve ability to perform fine motor tasks and ADLs.    Baseline Met 03/08/20 50#    Time 6    Period Weeks    Status Achieved                 Plan - 03/28/20 1439    Clinical Impression Statement Patient presented in clinic late but agreed to shortened treatment. Patient able to complete therex although hip adductor cramping reported with LAQ. Patient compliant with HEP when time allows due to recent stress. Patient ambulated into clinic safely without AD and significant gait deviations at a quick pace.    Personal Factors and Comorbidities Comorbidity 3+    Comorbidities Asthma, Anxiety, depression, diverticulitis, graves disease, thyroidectomy    Examination-Activity Limitations Locomotion Level;Transfers;Stand;Stairs;Caring for Others    Stability/Clinical Decision Making Evolving/Moderate complexity    Rehab Potential Fair    PT Frequency 2x / week    PT Duration 6 weeks    PT Treatment/Interventions Cryotherapy;Electrical Stimulation;Moist Heat;ADLs/Self Care Home Management;Gait training;Stair training;Functional mobility training;Therapeutic activities;Therapeutic exercise;Balance training;Manual techniques;Neuromuscular re-education;Passive range of motion;Patient/family education    PT Next Visit Plan cont with POC for UE and LE strengthening to tolerance. Balance activities in sitting and standing.    PT Home Exercise Plan see patient education section    Consulted and Agree with Plan of Care Patient           Patient will benefit from skilled therapeutic intervention in order to improve the following deficits and impairments:  Decreased activity tolerance, Decreased  balance, Difficulty walking, Pain, Impaired UE functional use, Impaired sensation, Decreased strength  Visit Diagnosis: Unsteadiness on feet  Muscle weakness (generalized)     Problem List Patient Active Problem List   Diagnosis Date Noted  . Sensory disturbance 01/20/2020  . Atypical chest pain 01/20/2020  . Obesity, Class III, BMI 40-49.9 (morbid obesity) ([place]) 01/20/2020  . Weakness 01/19/2020  . Hyperglycemia 10/04/2019  . Myalgia 09/05/2019  .  Vitamin B 12 deficiency 07/28/2019  . Right sided numbness 06/29/2019  . Constipation 06/15/2019  . Elevated lipase 06/15/2019  . Hypomagnesemia 03/29/2019  . Leukocytosis 03/29/2019  . Abdominal pain 02/10/2019  . Nausea without vomiting 02/10/2019  . Tingling of face 11/30/2018  . Flushing 11/30/2018  . Vitamin D deficiency 10/06/2018  . GERD (gastroesophageal reflux disease) 01/28/2017  . Rectal bleeding 01/28/2017  . 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 Hypothyroidism 04/14/2016  . Gastric polyp   . RUQ abdominal pain 09/06/2014  . Excessive or frequent menstruation 01/13/2013  . Depression     Standley Brooking, PTA 03/28/2020, 2:58 PM  [facility] [address] [place], [place], [address] Phone: [phone]   Fax:  [phone]  Name: Heather Fry MRN: [id number] Date of Birth: 10-15-1975

## 2020-03-29 ENCOUNTER — Ambulatory Visit: Payer: BC Managed Care – PPO | Attending: Internal Medicine | Admitting: Physical Therapy

## 2020-03-29 ENCOUNTER — Encounter: Payer: Self-pay | Admitting: Physical Therapy

## 2020-03-29 DIAGNOSIS — M9903 Segmental and somatic dysfunction of lumbar region: Secondary | ICD-10-CM | POA: Diagnosis not present

## 2020-03-29 DIAGNOSIS — M62830 Muscle spasm of back: Secondary | ICD-10-CM | POA: Diagnosis not present

## 2020-03-29 DIAGNOSIS — R2681 Unsteadiness on feet: Secondary | ICD-10-CM | POA: Insufficient documentation

## 2020-03-29 DIAGNOSIS — M6281 Muscle weakness (generalized): Secondary | ICD-10-CM | POA: Diagnosis not present

## 2020-03-29 DIAGNOSIS — M9901 Segmental and somatic dysfunction of cervical region: Secondary | ICD-10-CM | POA: Diagnosis not present

## 2020-03-29 DIAGNOSIS — M9902 Segmental and somatic dysfunction of thoracic region: Secondary | ICD-10-CM | POA: Diagnosis not present

## 2020-03-29 NOTE — Therapy (Addendum)
[facility] [place], [place], [address] Phone: [phone]   Fax:  [phone]  Physical Therapy Treatment PHYSICAL THERAPY DISCHARGE SUMMARY  Visits from Start of Care: 12  Current functional level related to goals / functional outcomes: See below   Remaining deficits: See goals   Education / Equipment: HEP Plan: Patient agrees to discharge.  Patient goals were partially met. Patient is being discharged due to not returning since the last visit.  ?????    Gabriela Eves, PT, DPT 09/12/20  Patient Details  Name: Heather Fry MRN: [id number] Date of Birth: 11-Aug-1976 Referring Provider (PT): Orson Eva, MD   Encounter Date: 03/29/2020   PT End of Session - 03/29/20 1529    Visit Number 12    Number of Visits 18    Date for PT Re-Evaluation 04/27/20    Authorization Type Progress note every 10th visit    PT Start Time 1517    PT Stop Time 1558    PT Time Calculation (min) 41 min    Activity Tolerance Patient tolerated treatment well    Behavior During Therapy [facility] for tasks assessed/performed           Past Medical History:  Diagnosis Date   Anxiety    Arthritis    Depression    Diverticulosis    GERD (gastroesophageal reflux disease)    Graves disease    Internal hemorrhoids    Thyroid disease    Uterine fibroid     Past Surgical History:  Procedure Laterality Date   ABDOMINAL HYSTERECTOMY     Per patient, uterine fibroids.   ABDOMINAL HYSTERECTOMY     CHOLECYSTECTOMY N/A 11/06/2014   Procedure: LAPAROSCOPIC CHOLECYSTECTOMY;  Surgeon: Jamesetta So, MD;  Location: [facility];  Service: General;  Laterality: N/A;   COLONOSCOPY N/A 03/04/2017   Dr. Gala Romney: Hemorrhoids, diverticulosis, benign polyp without adenomatous changes.  Next colonoscopy 10 years   DILATION AND CURETTAGE OF UTERUS     ESOPHAGOGASTRODUODENOSCOPY N/A 10/02/2014   Dr. Gala Romney: fundic gland polyps, hiatal hernia    MOUTH SURGERY     extraction of teeth   POLYPECTOMY  03/04/2017   Procedure: POLYPECTOMY;  Surgeon: Daneil Dolin, MD;  Location: [facility];  Service: Endoscopy;;  colon   THYROIDECTOMY N/A 09/30/2018   Procedure: TOTAL THYROIDECTOMY;  Surgeon: Armandina Gemma, MD;  Location: [facility];  Service: General;  Laterality: N/A;   TUBAL LIGATION      There were no vitals filed for this visit.   Subjective Assessment - 03/29/20 1528    Subjective COVID 19 screening performd on patient upon arrival. Patient reports she has had a hard time with breathing today due to humidity.    Patient is accompained by: Family member   Daughter   Pertinent History Asthma, Anxiety, depression, diverticulitis, graves disease, thyroidectomy    Limitations Standing;Walking;House hold activities    Diagnostic tests MRI, X-Ray, CT Scan: negative except 3 bulging discs in cervical spine.    Patient Stated Goals improve movement, decrease numbness in arm and leg    Currently in Pain? No/denies              [facility] PT Assessment - 03/29/20 0001      Assessment   Medical Diagnosis right sided numbness    Referring Provider (PT) Orson Eva, MD    Hand Dominance Right    Next MD Visit 04/18/2020 neurologist    Prior Therapy no  Precautions   Precautions Fall      Restrictions   Weight Bearing Restrictions No                         [facility] Adult PT Treatment/Exercise - 03/29/20 0001      Knee/Hip Exercises: Aerobic   Nustep L4 x12 min      Knee/Hip Exercises: Standing   Heel Raises Both;15 reps    Heel Raises Limitations B toe raise x15 reps    Lateral Step Up Both;15 reps;Hand Hold: 2;Step Height: 6"    Forward Step Up Both;15 reps;Hand Hold: 2;Step Height: 6"      Knee/Hip Exercises: Seated   Long Arc Quad Strengthening;Both;Weights;20 reps    Long Arc Quad Weight 4 lbs.    Long [employer] Limitations with ball squeeze    Clamshell with TheraBand Green   x20 rep   Hamstring Curl  Strengthening;Both;20 reps;Limitations    Hamstring Limitations green theraband                       PT Long Term Goals - 03/20/20 1416      PT LONG TERM GOAL #1   Title Patient will be independent with HEP    Time 6    Period Weeks    Status Achieved      PT LONG TERM GOAL #2   Title Patient will ambulate 200 feet with rollator and no loss of balance to safely access community.    Time 6    Period Weeks    Status On-going      PT LONG TERM GOAL #3   Title Patient will demonstrate 4/5 or greater bilateral UE and LE MMT to improve stability during functional tasks.    Time 6    Period Weeks    Status On-going      PT LONG TERM GOAL #4   Title Patient will improve right grip strength to  40 lbs of force to improve ability to perform fine motor tasks and ADLs.    Baseline Met 03/08/20 50#    Time 6    Period Weeks    Status Achieved                 Plan - 03/29/20 1627    Clinical Impression Statement Patient presented in clinic with use of SPC and only reports of difficulty breathing from humidity. Patient progressed through longer aerobic warmup which was monitored due to fatigability. Patient observed ambulating without LOB and more stable at a quick pace.    Personal Factors and Comorbidities Comorbidity 3+    Comorbidities Asthma, Anxiety, depression, diverticulitis, graves disease, thyroidectomy    Examination-Activity Limitations Locomotion Level;Transfers;Stand;Stairs;Caring for Others    Stability/Clinical Decision Making Evolving/Moderate complexity    Rehab Potential Fair    PT Frequency 2x / week    PT Duration 6 weeks    PT Treatment/Interventions Cryotherapy;Electrical Stimulation;Moist Heat;ADLs/Self Care Home Management;Gait training;Stair training;Functional mobility training;Therapeutic activities;Therapeutic exercise;Balance training;Manual techniques;Neuromuscular re-education;Passive range of motion;Patient/family education    PT Next  Visit Plan cont with POC for UE and LE strengthening to tolerance. Balance activities in sitting and standing.    PT Home Exercise Plan see patient education section    Consulted and Agree with Plan of Care Patient           Patient will benefit from skilled therapeutic intervention in order to improve the following deficits and impairments:  Decreased activity tolerance, Decreased balance, Difficulty walking, Pain, Impaired UE functional use, Impaired sensation, Decreased strength  Visit Diagnosis: Unsteadiness on feet  Muscle weakness (generalized)     Problem List Patient Active Problem List   Diagnosis Date Noted   Sensory disturbance 01/20/2020   Atypical chest pain 01/20/2020   Obesity, Class III, BMI 40-49.9 (morbid obesity) ([place]) 01/20/2020   Weakness 01/19/2020   Hyperglycemia 10/04/2019   Myalgia 09/05/2019   Vitamin B 12 deficiency 07/28/2019   Right sided numbness 06/29/2019   Constipation 06/15/2019   Elevated lipase 06/15/2019   Hypomagnesemia 03/29/2019   Leukocytosis 03/29/2019   Abdominal pain 02/10/2019   Nausea without vomiting 02/10/2019   Tingling of face 11/30/2018   Flushing 11/30/2018   Vitamin D deficiency 10/06/2018   GERD (gastroesophageal reflux disease) 01/28/2017   Rectal bleeding 01/28/2017   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   Hypothyroidism 04/14/2016   Gastric polyp    RUQ abdominal pain 09/06/2014   Excessive or frequent menstruation 01/13/2013   Depression     Standley Brooking, PTA 03/29/2020, 6:16 PM  [facility] [address] [place], [place], [address] Phone: [phone]   Fax:  [phone]  Name: Heather Fry MRN: [id number] Date of Birth: [birth year]

## 2020-04-03 ENCOUNTER — Ambulatory Visit: Payer: BC Managed Care – PPO | Admitting: *Deleted

## 2020-04-04 ENCOUNTER — Other Ambulatory Visit: Payer: Self-pay

## 2020-04-04 ENCOUNTER — Other Ambulatory Visit: Payer: Self-pay | Admitting: Internal Medicine

## 2020-04-04 ENCOUNTER — Other Ambulatory Visit (HOSPITAL_COMMUNITY): Payer: Self-pay | Admitting: Internal Medicine

## 2020-04-04 ENCOUNTER — Ambulatory Visit (HOSPITAL_COMMUNITY)
Admission: RE | Admit: 2020-04-04 | Discharge: 2020-04-04 | Disposition: A | Discharge status: Home / Self Care | Payer: BC Managed Care – PPO | Source: Ambulatory Visit | Attending: Internal Medicine | Admitting: Internal Medicine

## 2020-04-04 DIAGNOSIS — R0600 Dyspnea, unspecified: Secondary | ICD-10-CM

## 2020-04-04 DIAGNOSIS — R071 Chest pain on breathing: Secondary | ICD-10-CM | POA: Diagnosis not present

## 2020-04-04 DIAGNOSIS — R079 Chest pain, unspecified: Secondary | ICD-10-CM | POA: Diagnosis not present

## 2020-04-04 DIAGNOSIS — R0602 Shortness of breath: Secondary | ICD-10-CM | POA: Diagnosis not present

## 2020-04-04 DIAGNOSIS — Z6841 Body Mass Index (BMI) 40.0 and over, adult: Secondary | ICD-10-CM | POA: Diagnosis not present

## 2020-04-04 DIAGNOSIS — J329 Chronic sinusitis, unspecified: Secondary | ICD-10-CM | POA: Diagnosis not present

## 2020-04-04 IMAGING — CT CT ANGIO CHEST
2 of 6 series · 18 of 46 positions shown · IV contrast (Omnipaque or Isovue)
Comparison: X-ray [DATE].  CT abdomen pelvis [DATE].

CLINICAL DATA: Shortness of breath for 1 week

EXAM:
CT ANGIOGRAPHY CHEST WITH CONTRAST
TECHNIQUE: Multidetector CT imaging of the chest was performed using the
standard protocol during bolus administration of intravenous
contrast. Multiplanar CT image reconstructions and MIPs were
obtained to evaluate the vascular anatomy.
CONTRAST:  100mL OMNIPAQUE IOHEXOL 350 MG/ML SOLN

[Series 5: pe axial thins · axial · 0.67mm/px · z∈[+1128,+1413]mm · 15 of 313 slices shown]
[im 14/313  lung]
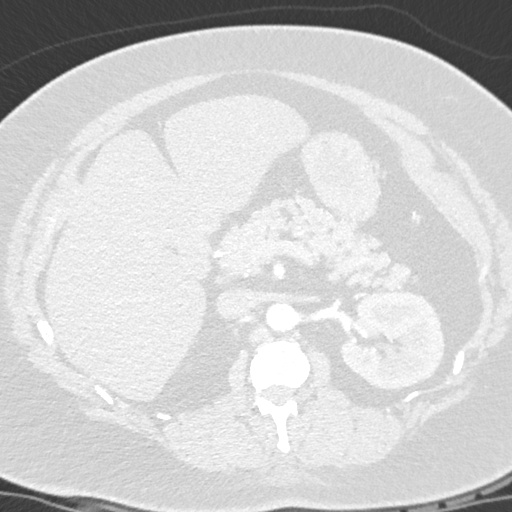
[im 41/313  soft-tissue]
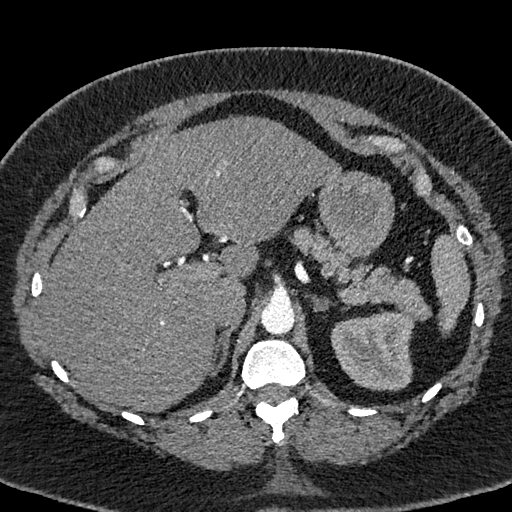
[im 55/313  lung]
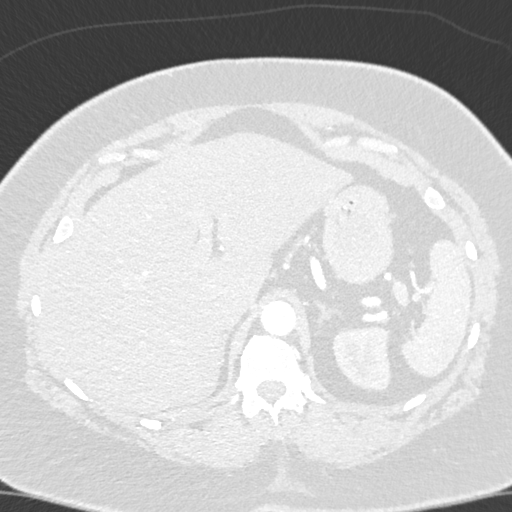
[im 82/313  soft-tissue]
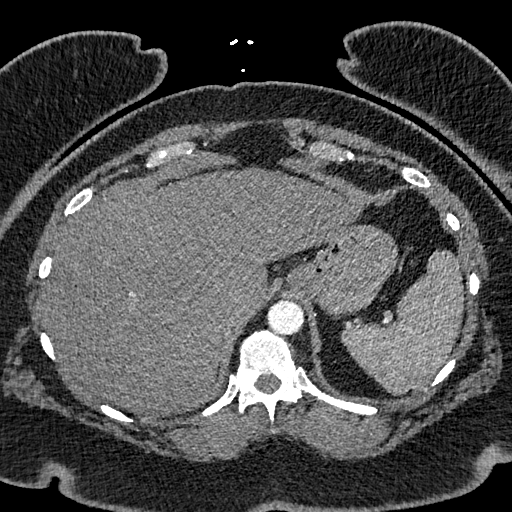
[im 95/313  lung]
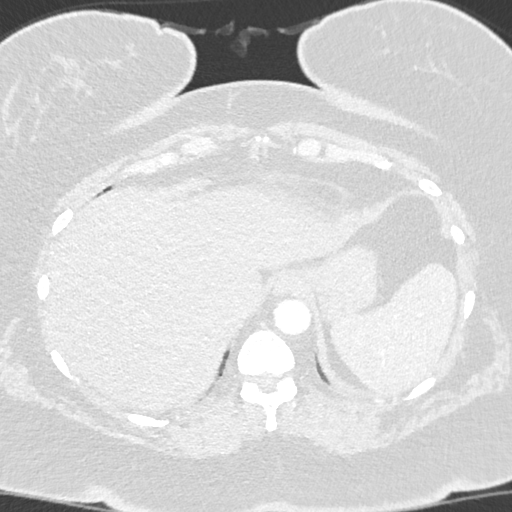
[im 123/313  soft-tissue]
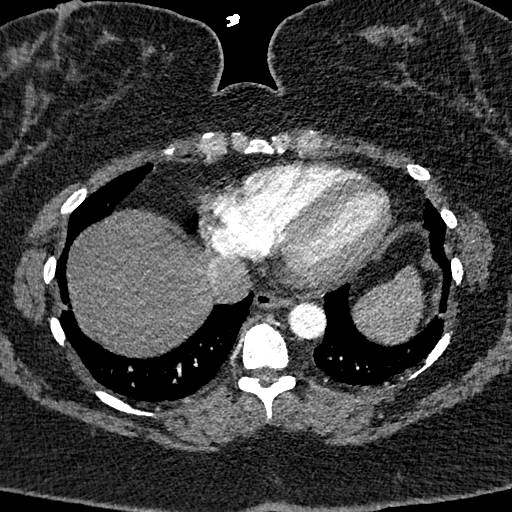
[im 136/313  lung]
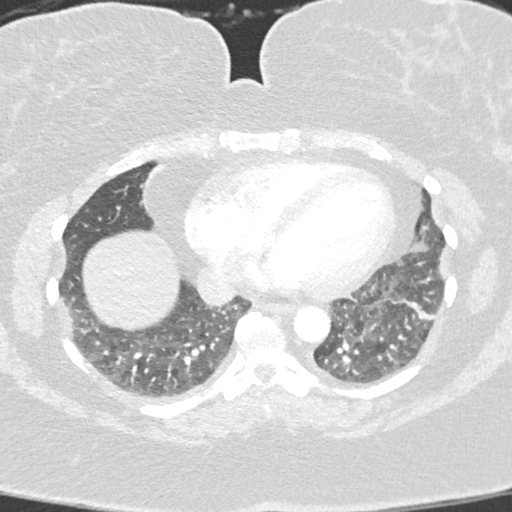
[im 163/313  soft-tissue]
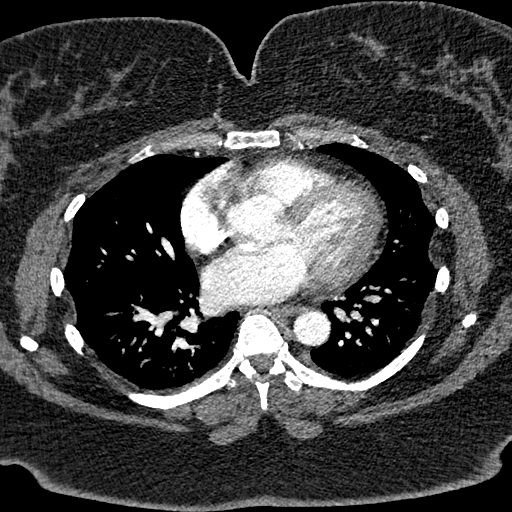
[im 177/313  lung]
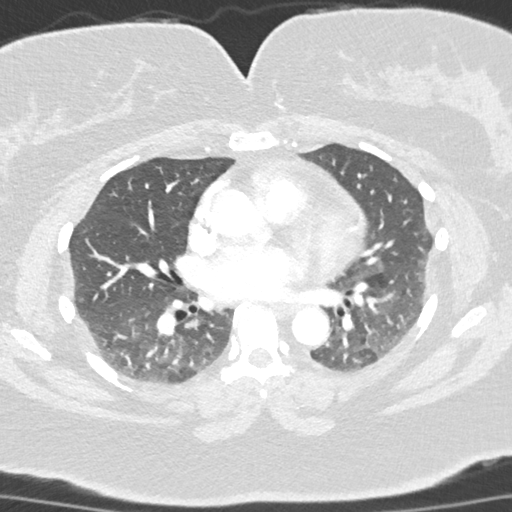
[im 190/313  soft-tissue]
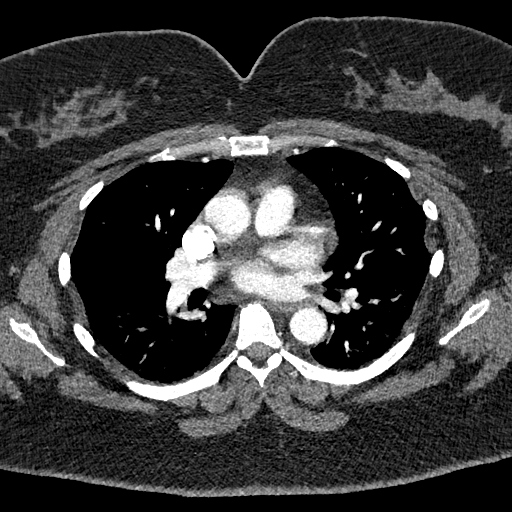
[im 218/313  lung]
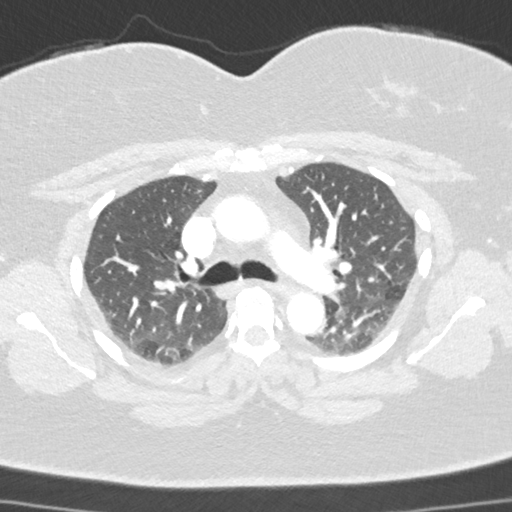
[im 231/313  soft-tissue]
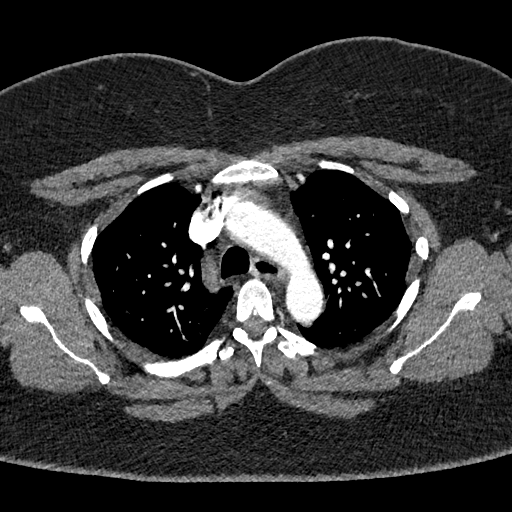
[im 258/313  lung]
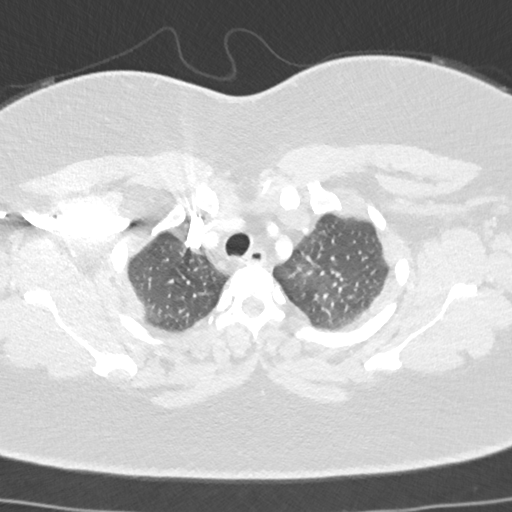
[im 272/313  soft-tissue]
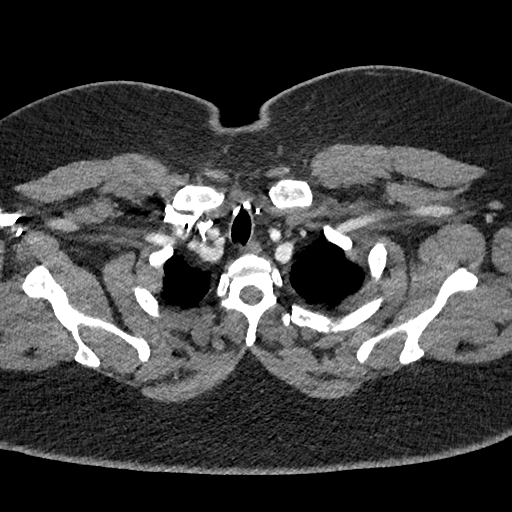
[im 299/313  lung]
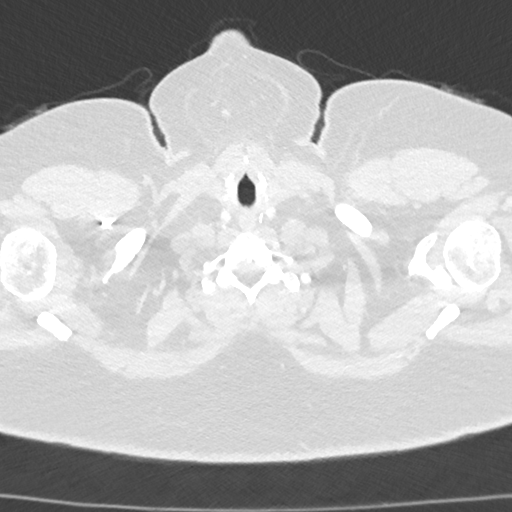

[Series 7: cor soft · coronal · 0.66mm/px · 3 of 151 slices shown]
[im 38/151  soft-tissue]
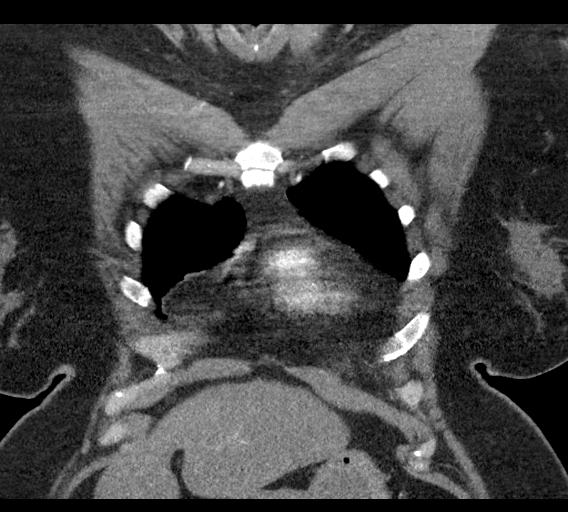
[im 76/151  soft-tissue]
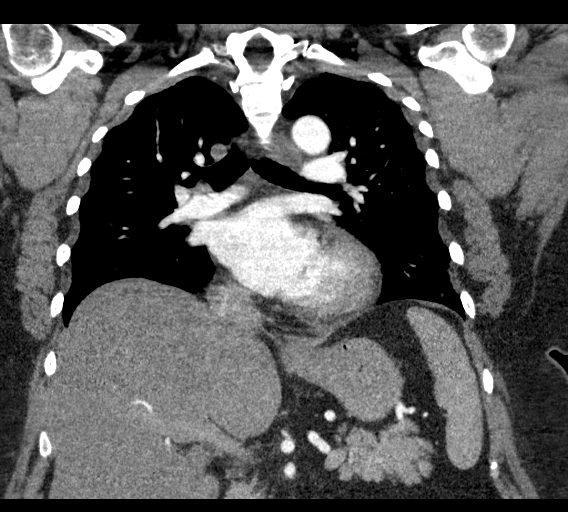
[im 113/151  soft-tissue]
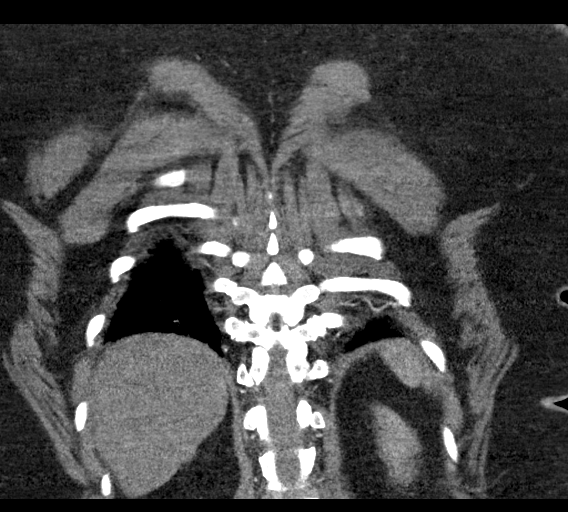

[18 of 46 positions shown; findings below may reference images not displayed]

FINDINGS: Cardiovascular: Satisfactory opacification of the pulmonary arteries
to the segmental level. Respiratory motion and beam hardening
artifact slightly degrade evaluation of the more distal pulmonary
arterial branches. No evidence of pulmonary embolism. Normal heart
size. No pericardial effusion. Thoracic aorta is normal in course
and caliber.

Mediastinum/Nodes: No axillary, mediastinal, or hilar
lymphadenopathy. Surgical clips in the region of the thyroid.
Trachea and esophagus within normal limits.

Lungs/Pleura: Linear scarring or atelectasis within the left lung
base. Mild ground-glass attenuation within the mid to lower lung
fields bilaterally. No lobar consolidation. No pleural effusion or
pneumothorax.

Upper Abdomen: 1.1 cm subcapsular mass at the posterior right
hepatic lobe (series 4, image 97) is unchanged from [6X], benign.
Prior cholecystectomy. No acute findings within the visualized upper
abdomen.

Musculoskeletal: No chest wall abnormality. No acute or significant
osseous findings.

Review of the MIP images confirms the above findings.
IMPRESSION: 1. Negative for pulmonary embolism.
2. Mild ground-glass attenuation within the mid to lower lung fields
bilaterally, which may represent mild edema versus small
airways/small vessel disease.

## 2020-04-04 MED ORDER — IOHEXOL 350 MG/ML SOLN
100.0000 mL | Freq: Once | INTRAVENOUS | Status: AC | PRN
  Administered 2020-04-04: 100 mL via INTRAVENOUS

## 2020-04-07 ENCOUNTER — Emergency Department (HOSPITAL_COMMUNITY): Payer: BC Managed Care – PPO

## 2020-04-07 ENCOUNTER — Emergency Department (HOSPITAL_COMMUNITY)
Admission: EM | Admit: 2020-04-07 | Discharge: 2020-04-08 | Disposition: A | Discharge status: Home / Self Care | Payer: BC Managed Care – PPO | Attending: Emergency Medicine | Admitting: Emergency Medicine

## 2020-04-07 ENCOUNTER — Encounter (HOSPITAL_COMMUNITY): Payer: Self-pay | Admitting: *Deleted

## 2020-04-07 ENCOUNTER — Other Ambulatory Visit: Payer: Self-pay

## 2020-04-07 DIAGNOSIS — J45909 Unspecified asthma, uncomplicated: Secondary | ICD-10-CM | POA: Diagnosis not present

## 2020-04-07 DIAGNOSIS — R079 Chest pain, unspecified: Secondary | ICD-10-CM

## 2020-04-07 DIAGNOSIS — Z87891 Personal history of nicotine dependence: Secondary | ICD-10-CM | POA: Insufficient documentation

## 2020-04-07 DIAGNOSIS — R0789 Other chest pain: Secondary | ICD-10-CM | POA: Insufficient documentation

## 2020-04-07 LAB — BASIC METABOLIC PANEL
Anion gap: 11 (ref 5–15)
BUN: 9 mg/dL (ref 6–20)
CO2: 24 mmol/L (ref 22–32)
Calcium: 9.2 mg/dL (ref 8.9–10.3)
Chloride: 103 mmol/L (ref 98–111)
Creatinine, Ser: 0.67 mg/dL (ref 0.44–1.00)
GFR calc Af Amer: >60 mL/min (ref >60)
GFR calc non Af Amer: >60 mL/min (ref >60)
Glucose, Bld: 95 mg/dL (ref 70–99)
Potassium: 3.6 mmol/L (ref 3.5–5.1)
Sodium: 138 mmol/L (ref 135–145)

## 2020-04-07 LAB — CBC
HCT: 40 % (ref 36.0–46.0)
Hemoglobin: 12.7 g/dL (ref 12.0–15.0)
MCH: 29.5 pg (ref 26.0–34.0)
MCHC: 31.8 g/dL (ref 30.0–36.0)
MCV: 93 fL (ref 80.0–100.0)
Platelets: 258 10*3/uL (ref 150–400)
RBC: 4.3 MIL/uL (ref 3.87–5.11)
RDW: 15 % (ref 11.5–15.5)
WBC: 12.6 10*3/uL — ABNORMAL HIGH (ref 4.0–10.5)
nRBC: 0 % (ref 0.0–0.2)

## 2020-04-07 LAB — TROPONIN I (HIGH SENSITIVITY): Troponin I (High Sensitivity): 4 ng/L (ref <18)

## 2020-04-07 IMAGING — DX DG CHEST 1V PORT
1 series · 1 of 1 positions shown · non-contrast
Comparison: Radiograph [DATE], CT [DATE]

CLINICAL DATA: Shortness of breath, left chest pain

EXAM:
PORTABLE CHEST 1 VIEW

[chest ap grid]
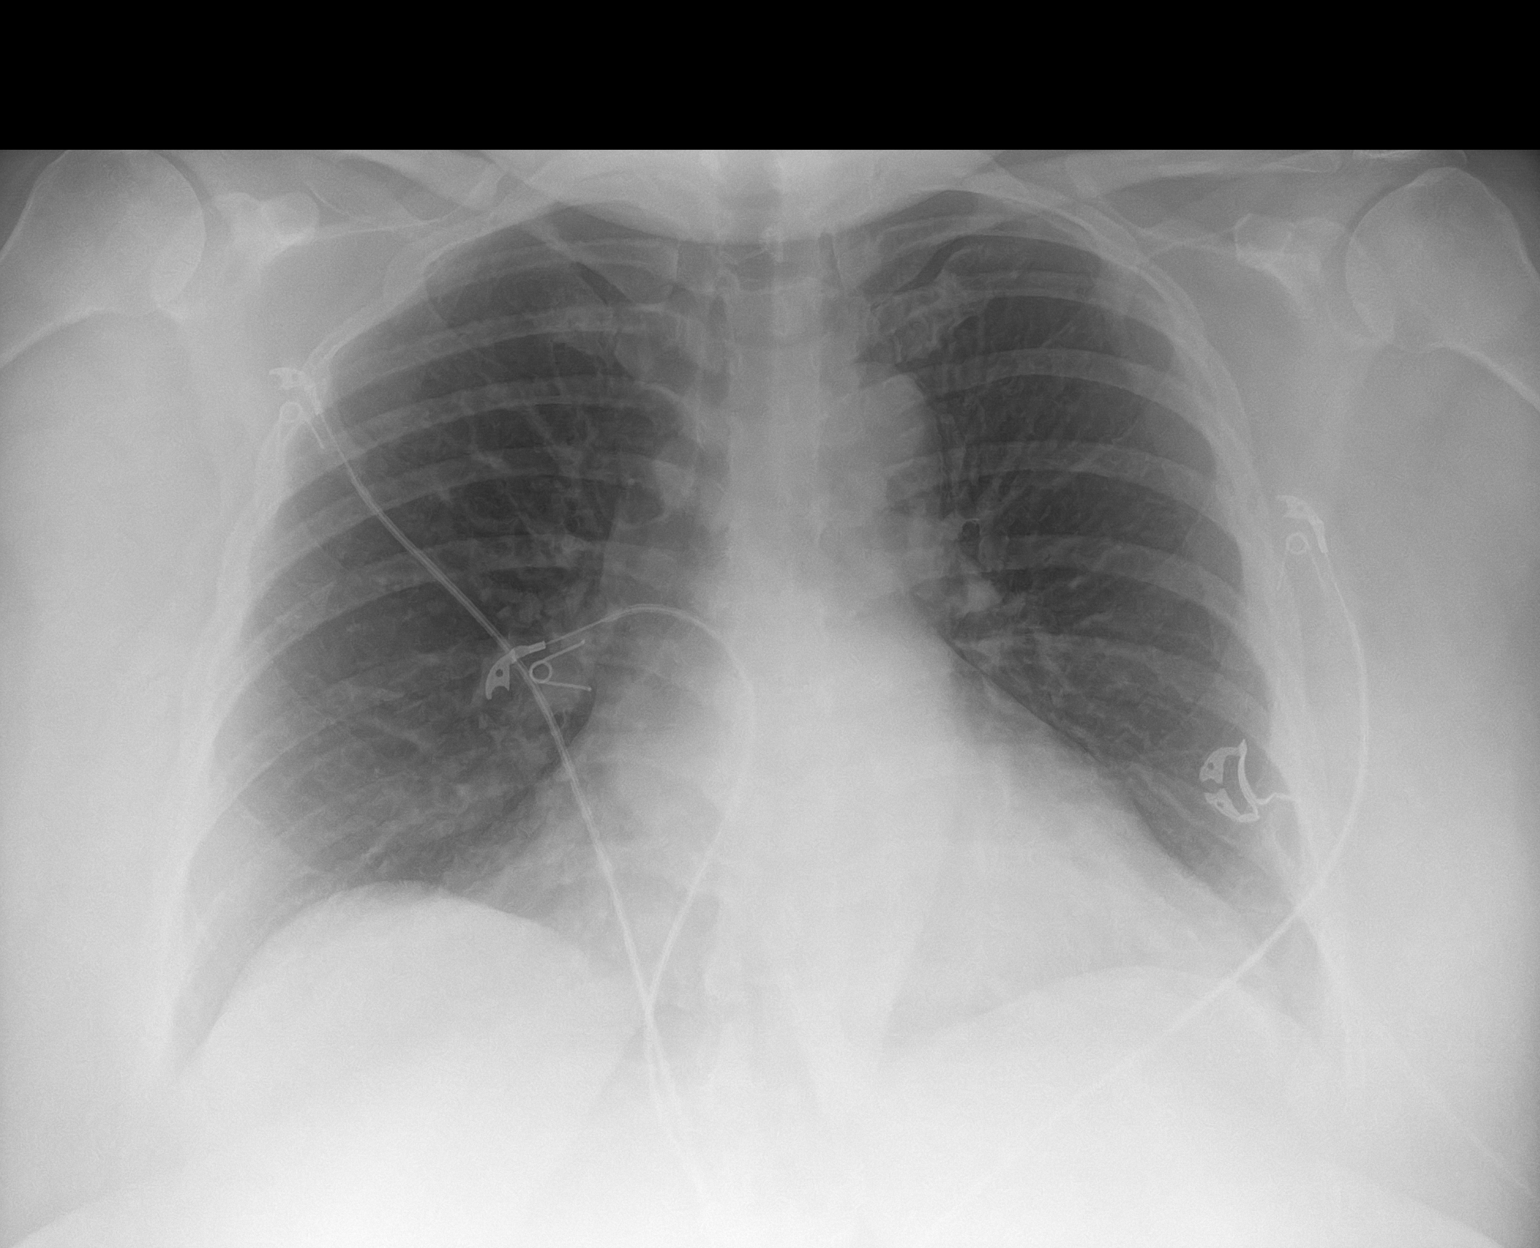

[1 of 1 positions shown; findings below may reference images not displayed]

FINDINGS: No consolidation, features of edema, pneumothorax, or effusion.
Pulmonary vascularity is normally distributed. The cardiomediastinal
contours are unremarkable. No acute osseous or soft tissue
abnormality.
IMPRESSION: No acute cardiopulmonary abnormality.

## 2020-04-07 MED ORDER — ACETAMINOPHEN 500 MG PO TABS
1000.0000 mg | ORAL_TABLET | Freq: Once | ORAL | Status: AC
  Administered 2020-04-07: 1000 mg via ORAL
  Filled 2020-04-07: qty 2

## 2020-04-07 NOTE — ED Triage Notes (Signed)
Pt c/o right side chest pain located at breast and axilla area that started today, pt states that she was recently treated for sinus infection and was seen by pcp Wednesday for sob, pt had ct chest performed and was told that she had inflammation in her lungs, was placed on steroids and antibiotics.

## 2020-04-07 NOTE — ED Provider Notes (Signed)
[facility] Emergency Department Provider Note MRN:  [id number]  Arrival date & time: 04/07/20     Chief Complaint   Chest Pain   History of Present Illness   Heather Fry is a 44 y.o. year-old female with a history of thyroid disease, anxiety presenting to the ED with chief complaint of chest.  Patient explains that at 3 PM today she became tight in the chest with shortness of breath, consistent with prior asthma exacerbations.  She took her inhalers and felt better.  Few hours later, she was making the bed and reached across the bed and then noted some right-sided chest pain, described as a sharp pain, worse with motion of the right arm.  Denies shortness of breath, no dizziness or diaphoresis, no nausea or vomiting.  Pain is mild to moderate in severity, no other exacerbating or alleviating factors.  Review of Systems  A complete 10 system review of systems was obtained and all systems are negative except as noted in the HPI and PMH.   Patient's Health History    Past Medical History:  Diagnosis Date  . Anxiety   . Arthritis   . Depression   . Diverticulosis   . GERD (gastroesophageal reflux disease)   . Graves disease   . Internal hemorrhoids   . Thyroid disease   . Uterine fibroid     Past Surgical History:  Procedure Laterality Date  . ABDOMINAL HYSTERECTOMY     Per patient, uterine fibroids.  . ABDOMINAL HYSTERECTOMY    . CHOLECYSTECTOMY N/A 11/06/2014   Procedure: LAPAROSCOPIC CHOLECYSTECTOMY;  Surgeon: Jamesetta So, MD;  Location: [facility];  Service: General;  Laterality: N/A;  . COLONOSCOPY N/A 03/04/2017   Dr. Gala Romney: Hemorrhoids, diverticulosis, benign polyp without adenomatous changes.  Next colonoscopy 10 years  . DILATION AND CURETTAGE OF UTERUS    . ESOPHAGOGASTRODUODENOSCOPY N/A 10/02/2014   Dr. Gala Romney: fundic gland polyps, hiatal hernia  . MOUTH SURGERY     extraction of teeth  . POLYPECTOMY  03/04/2017   Procedure: POLYPECTOMY;   Surgeon: Daneil Dolin, MD;  Location: [facility];  Service: Endoscopy;;  colon  . THYROIDECTOMY N/A 09/30/2018   Procedure: TOTAL THYROIDECTOMY;  Surgeon: Armandina Gemma, MD;  Location: [facility];  Service: General;  Laterality: N/A;  . TUBAL LIGATION      Family History  Problem Relation Age of Onset  . Hypertension Father   . Diabetes Father   . Depression Other   . Uterine cancer Mother        ?cervical cancer  . Uterine cancer Sister        suicide  . Colon cancer Paternal Aunt 73  . Thyroid disease Neg Hx     Social History   Socioeconomic History  . Marital status: Married    Spouse name: Not on file  . Number of children: Not on file  . Years of education: Not on file  . Highest education level: Not on file  Occupational History  . Not on file  Tobacco Use  . Smoking status: Former Smoker    Packs/day: 1.00    Years: 5.00    Pack years: 5.00    Types: Cigarettes    Quit date: 11/03/1997    Years since quitting: 22.4  . Smokeless tobacco: Never Used  . Tobacco comment: 1 pack 1 week  Vaping Use  . Vaping Use: Never used  Substance and Sexual Activity  . Alcohol use: No  Alcohol/week: 0.0 standard drinks  . Drug use: No  . Sexual activity: Yes    Birth control/protection: Surgical  Other Topics Concern  . Not on file  Social History Narrative  . Not on file   Social Determinants of Health   Financial Resource Strain:   . Difficulty of Paying Living Expenses:   Food Insecurity:   . Worried About Charity fundraiser in the Last Year:   . Arboriculturist in the Last Year:   Transportation Needs:   . Film/video editor (Medical):   Marland Kitchen Lack of Transportation (Non-Medical):   Physical Activity:   . Days of Exercise per Week:   . Minutes of Exercise per Session:   Stress:   . Feeling of Stress :   Social Connections:   . Frequency of Communication with Friends and Family:   . Frequency of Social Gatherings with Friends and Family:   . Attends  Religious Services:   . Active Member of Clubs or Organizations:   . Attends Archivist Meetings:   Marland Kitchen Marital Status:   Intimate Partner Violence:   . Fear of Current or Ex-Partner:   . Emotionally Abused:   Marland Kitchen Physically Abused:   . Sexually Abused:      Physical Exam   Vitals:   04/07/20 2200 04/07/20 2300  BP: (!) 154/85 (!) 141/83  Pulse: 84 81  Resp: 17 14  Temp:    SpO2: 98% 99%    CONSTITUTIONAL: Well-appearing, NAD NEURO:  Alert and oriented x 3, no focal deficits EYES:  eyes equal and reactive ENT/NECK:  no LAD, no JVD CARDIO: Regular rate, well-perfused, normal S1 and S2 PULM:  CTAB no wheezing or rhonchi GI/GU:  normal bowel sounds, non-distended, non-tender MSK/SPINE:  No gross deformities, no edema SKIN:  no rash, atraumatic PSYCH:  Appropriate speech and behavior  *Additional and/or pertinent findings included in MDM below  Diagnostic and Interventional Summary    EKG Interpretation  Date/Time:  Saturday April 07 2020 21:43:23 EDT Ventricular Rate:  85 PR Interval:    QRS Duration: 72 QT Interval:  373 QTC Calculation: 444 R Axis:   49 Text Interpretation: Sinus rhythm Low voltage, precordial leads Borderline T abnormalities, diffuse leads Baseline wander in lead(s) II III V3 V4 V5 Confirmed by Gerlene Fee [phone]) on 04/07/2020 9:47:39 PM      Labs Reviewed  CBC - Abnormal; Notable for the following components:      Result Value   WBC 12.6 (*)    All other components within normal limits  BASIC METABOLIC PANEL  TROPONIN I (HIGH SENSITIVITY)  TROPONIN I (HIGH SENSITIVITY)    DG Chest Port 1 View    (Results Pending)    Medications  acetaminophen (TYLENOL) tablet 1,000 mg (1,000 mg Oral Given 04/07/20 2211)     Procedures  /  Critical Care Procedures  ED Course and Medical Decision Making  I have reviewed the triage vital signs, the nursing notes, and pertinent available records from the EMR.  Listed above are laboratory and  imaging tests that I personally ordered, reviewed, and interpreted and then considered in my medical decision making (see below for details).      Suspect MSK related pain, chest x-ray to exclude pneumothorax, EKG is reassuring, will check troponin to exclude ACS.  Very low concern for pulmonary embolism, PERC negative.  Troponin is negative, awaiting chest x-ray, anticipating discharge.  Signed out to oncoming provider at shift change.  Legrand Como  Viona Gilmore, MD [place] [email or web address]  Final Clinical Impressions(s) / ED Diagnoses     ICD-10-CM   1. Chest pain, unspecified type  R07.9     ED Discharge Orders    None       Discharge Instructions Discussed with and Provided to Patient:     Discharge Instructions     You were evaluated in the Emergency Department and after careful evaluation, we did not find any emergent condition requiring admission or further testing in the hospital.  Your exam/testing today is overall reassuring.  We recommend Tylenol or Motrin at home for discomfort and follow-up with your regular doctors.  Please return to the Emergency Department if you experience any worsening of your condition.   Thank you for allowing [place] to be a part of your care.       Maudie Flakes, MD 04/07/20 2329

## 2020-04-07 NOTE — Discharge Instructions (Signed)
You were evaluated in the Emergency Department and after careful evaluation, we did not find any emergent condition requiring admission or further testing in the hospital.  Your exam/testing today is overall reassuring.  We recommend Tylenol or Motrin at home for discomfort and follow-up with your regular doctors.  Please return to the Emergency Department if you experience any worsening of your condition.   Thank you for allowing [place] to be a part of your care.

## 2020-04-08 DIAGNOSIS — R079 Chest pain, unspecified: Secondary | ICD-10-CM | POA: Diagnosis not present

## 2020-04-08 LAB — D-DIMER, QUANTITATIVE: D-Dimer, Quant: 0.34 ug/mL-FEU (ref 0.00–0.50)

## 2020-04-08 LAB — TROPONIN I (HIGH SENSITIVITY): Troponin I (High Sensitivity): 4 ng/L (ref <18)

## 2020-04-08 MED ORDER — PROMETHAZINE HCL 25 MG/ML IJ SOLN
25.0000 mg | Freq: Once | INTRAMUSCULAR | Status: DC
  Filled 2020-04-08: qty 1

## 2020-04-08 NOTE — ED Provider Notes (Signed)
Care assumed from Dr. Sedonia Small.  Patient with right-sided chest pain atypical for ACS.  She is awaiting chest x-ray.  Chest x-ray is negative.  Troponin negative x2, D-dimer negative.  Low suspicion for ACS or pulmonary embolism.  Patient will be treated supportively.  Follow-up with PCP.  Return precautions discussed.   Ezequiel Essex, MD 04/08/20 [phone]

## 2020-04-11 DIAGNOSIS — Z01812 Encounter for preprocedural laboratory examination: Secondary | ICD-10-CM | POA: Diagnosis not present

## 2020-04-11 DIAGNOSIS — Z20822 Contact with and (suspected) exposure to covid-19: Secondary | ICD-10-CM | POA: Diagnosis not present

## 2020-04-11 DIAGNOSIS — R05 Cough: Secondary | ICD-10-CM | POA: Diagnosis not present

## 2020-04-11 DIAGNOSIS — R0602 Shortness of breath: Secondary | ICD-10-CM | POA: Diagnosis not present

## 2020-04-18 ENCOUNTER — Other Ambulatory Visit: Payer: Self-pay

## 2020-04-18 ENCOUNTER — Encounter (HOSPITAL_COMMUNITY): Payer: Self-pay | Admitting: Emergency Medicine

## 2020-04-18 ENCOUNTER — Emergency Department (HOSPITAL_COMMUNITY)
Admission: EM | Admit: 2020-04-18 | Discharge: 2020-04-18 | Disposition: A | Discharge status: Home / Self Care | Payer: BC Managed Care – PPO | Attending: Emergency Medicine | Admitting: Emergency Medicine

## 2020-04-18 DIAGNOSIS — E860 Dehydration: Secondary | ICD-10-CM | POA: Diagnosis not present

## 2020-04-18 DIAGNOSIS — E039 Hypothyroidism, unspecified: Secondary | ICD-10-CM | POA: Diagnosis not present

## 2020-04-18 DIAGNOSIS — Z87891 Personal history of nicotine dependence: Secondary | ICD-10-CM | POA: Insufficient documentation

## 2020-04-18 DIAGNOSIS — G959 Disease of spinal cord, unspecified: Secondary | ICD-10-CM | POA: Insufficient documentation

## 2020-04-18 DIAGNOSIS — G2581 Restless legs syndrome: Secondary | ICD-10-CM | POA: Insufficient documentation

## 2020-04-18 DIAGNOSIS — G609 Hereditary and idiopathic neuropathy, unspecified: Secondary | ICD-10-CM | POA: Diagnosis not present

## 2020-04-18 DIAGNOSIS — Z79899 Other long term (current) drug therapy: Secondary | ICD-10-CM | POA: Insufficient documentation

## 2020-04-18 DIAGNOSIS — G4733 Obstructive sleep apnea (adult) (pediatric): Secondary | ICD-10-CM | POA: Diagnosis not present

## 2020-04-18 DIAGNOSIS — Z9989 Dependence on other enabling machines and devices: Secondary | ICD-10-CM | POA: Insufficient documentation

## 2020-04-18 DIAGNOSIS — R531 Weakness: Secondary | ICD-10-CM | POA: Diagnosis not present

## 2020-04-18 LAB — BASIC METABOLIC PANEL
Anion gap: 13 (ref 5–15)
BUN: 10 mg/dL (ref 6–20)
CO2: 25 mmol/L (ref 22–32)
Calcium: 9.2 mg/dL (ref 8.9–10.3)
Chloride: 101 mmol/L (ref 98–111)
Creatinine, Ser: 0.67 mg/dL (ref 0.44–1.00)
GFR calc Af Amer: >60 mL/min (ref >60)
GFR calc non Af Amer: >60 mL/min (ref >60)
Glucose, Bld: 92 mg/dL (ref 70–99)
Potassium: 4 mmol/L (ref 3.5–5.1)
Sodium: 139 mmol/L (ref 135–145)

## 2020-04-18 LAB — URINALYSIS, ROUTINE W REFLEX MICROSCOPIC
Bilirubin Urine: NEGATIVE
Glucose, UA: NEGATIVE mg/dL
Ketones, ur: NEGATIVE mg/dL
Leukocytes,Ua: NEGATIVE
Nitrite: NEGATIVE
Protein, ur: NEGATIVE mg/dL
Specific Gravity, Urine: 1.01 (ref 1.005–1.030)
pH: 8 (ref 5.0–8.0)

## 2020-04-18 LAB — PREGNANCY, URINE: Preg Test, Ur: NEGATIVE

## 2020-04-18 LAB — CBC WITH DIFFERENTIAL/PLATELET
Abs Immature Granulocytes: 0.11 10*3/uL — ABNORMAL HIGH (ref 0.00–0.07)
Basophils Absolute: 0.1 10*3/uL (ref 0.0–0.1)
Basophils Relative: 0 %
Eosinophils Absolute: 0 10*3/uL (ref 0.0–0.5)
Eosinophils Relative: 0 %
HCT: 40.3 % (ref 36.0–46.0)
Hemoglobin: 13 g/dL (ref 12.0–15.0)
Immature Granulocytes: 1 %
Lymphocytes Relative: 14 %
Lymphs Abs: 2.2 10*3/uL (ref 0.7–4.0)
MCH: 29.8 pg (ref 26.0–34.0)
MCHC: 32.3 g/dL (ref 30.0–36.0)
MCV: 92.4 fL (ref 80.0–100.0)
Monocytes Absolute: 0.7 10*3/uL (ref 0.1–1.0)
Monocytes Relative: 5 %
Neutro Abs: 12.6 10*3/uL — ABNORMAL HIGH (ref 1.7–7.7)
Neutrophils Relative %: 80 %
Platelets: 272 10*3/uL (ref 150–400)
RBC: 4.36 MIL/uL (ref 3.87–5.11)
RDW: 15 % (ref 11.5–15.5)
WBC: 15.6 10*3/uL — ABNORMAL HIGH (ref 4.0–10.5)
nRBC: 0 % (ref 0.0–0.2)

## 2020-04-18 LAB — TSH: TSH: 2.133 u[IU]/mL (ref 0.350–4.500)

## 2020-04-18 LAB — POC URINE PREG, ED: Preg Test, Ur: NEGATIVE

## 2020-04-18 MED ORDER — SODIUM CHLORIDE 0.9 % IV BOLUS
2000.0000 mL | Freq: Once | INTRAVENOUS | Status: AC
  Administered 2020-04-18: 2000 mL via INTRAVENOUS

## 2020-04-18 NOTE — ED Provider Notes (Signed)
[facility] Provider Note   CSN: [id number] Arrival date & time: 04/18/20  1609     History Chief Complaint  Patient presents with  . Weakness    Heather Fry is a 44 y.o. female.  HPI   Patient is a 44 year old female with a history of anxiety/depression, arthritis, diverticulosis, GERD, Graves' disease, thyroid disease, uterine fibroids, who presents the emergency department today for evaluation of lightheadedness.  States that since yesterday she has not felt well and has had a feeling of near syncope.  She has not actually had a syncopal episode.  States that when she was at a doctor's appointment today with her neurologist her blood pressure was noted to be 259 systolic and had a heart rate of 101.  States that when her blood pressure is this low she usually feels bad.  The neurologist told her that she was dehydrated and advised her to push fluids at home however she was concerned about her symptoms so came here to the ED for further evaluation.  She denies any fevers, chest pain, cough, abdominal pain, vomiting.  She did have an episode of diarrhea after leaving her neurologist office and had and episode of dysuria this morning. She denies any cough.  She has chronic shortness of breath which is unchanged today.  She denies any other associated symptoms. Denies hematochezia or melena.   States she typically takes her zyrtec at night but has been taking it in the morning recently and she is not sure if this is related to her sxs. Is on steroids.   Past Medical History:  Diagnosis Date  . Anxiety   . Arthritis   . Depression   . Diverticulosis   . GERD (gastroesophageal reflux disease)   . Graves disease   . Internal hemorrhoids   . Thyroid disease   . Uterine fibroid     Patient Active Problem List   Diagnosis Date Noted  . Sensory disturbance 01/20/2020  . Atypical chest pain 01/20/2020  . Obesity, Class III, BMI 40-49.9 (morbid obesity) ([place])  01/20/2020  . Weakness 01/19/2020  . Hyperglycemia 10/04/2019  . Myalgia 09/05/2019  . Vitamin B 12 deficiency 07/28/2019  . Right sided numbness 06/29/2019  . Constipation 06/15/2019  . Elevated lipase 06/15/2019  . Hypomagnesemia 03/29/2019  . Leukocytosis 03/29/2019  . Abdominal pain 02/10/2019  . Nausea without vomiting 02/10/2019  . Tingling of face 11/30/2018  . Flushing 11/30/2018  . Vitamin D deficiency 10/06/2018  . GERD (gastroesophageal reflux disease) 01/28/2017  . Rectal bleeding 01/28/2017  . 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 Hypothyroidism 04/14/2016  . Gastric polyp   . RUQ abdominal pain 09/06/2014  . Excessive or frequent menstruation 01/13/2013  . Depression     Past Surgical History:  Procedure Laterality Date  . ABDOMINAL HYSTERECTOMY     Per patient, uterine fibroids.  . ABDOMINAL HYSTERECTOMY    . CHOLECYSTECTOMY N/A 11/06/2014   Procedure: LAPAROSCOPIC CHOLECYSTECTOMY;  Surgeon: Jamesetta So, MD;  Location: [facility];  Service: General;  Laterality: N/A;  . COLONOSCOPY N/A 03/04/2017   Dr. Gala Romney: Hemorrhoids, diverticulosis, benign polyp without adenomatous changes.  Next colonoscopy 10 years  . DILATION AND CURETTAGE OF UTERUS    . ESOPHAGOGASTRODUODENOSCOPY N/A 10/02/2014   Dr. Gala Romney: fundic gland polyps, hiatal hernia  . MOUTH SURGERY     extraction of teeth  . POLYPECTOMY  03/04/2017   Procedure: POLYPECTOMY;  Surgeon: Daneil Dolin, MD;  Location: [facility];  Service:  Endoscopy;;  colon  . THYROIDECTOMY N/A 09/30/2018   Procedure: TOTAL THYROIDECTOMY;  Surgeon: Armandina Gemma, MD;  Location: [facility];  Service: General;  Laterality: N/A;  . TUBAL LIGATION       OB History    Gravida  2   Para  2   Term  2   Preterm      AB      Living  2     SAB      TAB      Ectopic      Multiple      Live Births              Family History  Problem Relation Age of Onset  . Hypertension Father   . Diabetes Father   . Depression Other    . Uterine cancer Mother        ?cervical cancer  . Uterine cancer Sister        suicide  . Colon cancer Paternal Aunt 63  . Thyroid disease Neg Hx     Social History   Tobacco Use  . Smoking status: Former Smoker    Packs/day: 1.00    Years: 5.00    Pack years: 5.00    Types: Cigarettes    Quit date: 11/03/1997    Years since quitting: 22.4  . Smokeless tobacco: Never Used  . Tobacco comment: 1 pack 1 week  Vaping Use  . Vaping Use: Never used  Substance Use Topics  . Alcohol use: No    Alcohol/week: 0.0 standard drinks  . Drug use: No    Home Medications Prior to Admission medications   Medication Sig Start Date End Date Taking? Authorizing Provider  acetaminophen (TYLENOL) 500 MG tablet Take 1,000 mg by mouth daily as needed for moderate pain.    [provider]  albuterol (VENTOLIN HFA) 108 (90 Base) MCG/ACT inhaler Inhale 2 puffs into the lungs every 6 (six) hours as needed. 12/13/19   Julian Hy, DO  budesonide-formoterol (SYMBICORT) 160-4.5 MCG/ACT inhaler Inhale 2 puffs into the lungs in the morning and at bedtime. 12/13/19   Julian Hy, DO  cetirizine (ZYRTEC ALLERGY) 10 MG tablet Take 1 tablet (10 mg total) by mouth daily. Patient taking differently: Take 10 mg by mouth at bedtime.  12/13/19   Julian Hy, DO  Cholecalciferol (VITAMIN D3) 125 MCG (5000 UT) CAPS Take 1 capsule (5,000 Units total) by mouth daily. Patient taking differently: Take 10,000 Units by mouth at bedtime.  10/13/19   Renato Shin, MD  docusate sodium (COLACE) 100 MG capsule Take 100 mg by mouth at bedtime.    [provider]  escitalopram (LEXAPRO) 20 MG tablet Take 1 tablet (20 mg total) by mouth at bedtime. 01/26/19   Renato Shin, MD  fluticasone (FLONASE) 50 MCG/ACT nasal spray Place 2 sprays into both nostrils daily. Patient taking differently: Place 2 sprays into both nostrils at bedtime.  12/13/19   Julian Hy, DO  gabapentin (NEURONTIN) 300 MG capsule Take 1  capsule (300 mg total) by mouth 2 (two) times daily. Patient not taking: Reported on 01/30/2020 01/20/20   Orson Eva, MD  Magnesium 250 MG TABS Take 250 mg by mouth at bedtime.     [provider]  methylPREDNISolone (MEDROL) 4 MG tablet Take 4 mg by mouth in the morning and at bedtime.    [provider]  montelukast (SINGULAIR) 10 MG tablet Take 1 tablet (10 mg total) by  mouth at bedtime. 12/13/19   Julian Hy, DO  NON FORMULARY at bedtime. CPAP at night     [provider]  omeprazole (PRILOSEC) 20 MG capsule Take 20 mg by mouth 2 (two) times daily before a meal.    [provider]  prednisoLONE acetate (PRED FORTE) 1 % ophthalmic suspension Place 1 drop into both eyes daily. 12/30/19   [provider]  RESTASIS 0.05 % ophthalmic emulsion Place 1 drop into both eyes 2 (two) times daily. 12/26/19   [provider]  simethicone (PHAZYME) 125 MG chewable tablet Chew 125-250 mg by mouth every 6 (six) hours as needed for flatulence.     [provider]  sucralfate (CARAFATE) 1 g tablet Take 1 g by mouth 4 (four) times daily. 01/18/20   [provider]  SYNTHROID 150 MCG tablet Take 1 tablet (150 mcg total) by mouth daily before breakfast. 10/04/19   Renato Shin, MD  Tiotropium Bromide Monohydrate (SPIRIVA RESPIMAT) 1.25 MCG/ACT AERS Inhale 2 puffs into the lungs daily. 01/05/20   Julian Hy, DO  vitamin B-12 (CYANOCOBALAMIN) 1000 MCG tablet Take 1,000 mcg by mouth at bedtime.    [provider]  Wheat Dextrin (BENEFIBER DRINK MIX PO) Take 1 Dose by mouth 3 (three) times daily.     [provider]    Allergies    Levofloxacin, Toradol [ketorolac tromethamine], and Penicillins  Review of Systems   Review of Systems  Constitutional: Negative for fever.  HENT: Negative for ear pain and sore throat.   Eyes: Negative for visual disturbance.  Respiratory: Negative for cough and shortness of breath.    Cardiovascular: Negative for chest pain.  Gastrointestinal: Positive for diarrhea and nausea. Negative for abdominal pain, blood in stool and vomiting.  Genitourinary: Negative for dysuria and hematuria.  Musculoskeletal: Negative for back pain.  Skin: Negative for color change and rash.  Neurological: Positive for light-headedness. Negative for syncope.  All other systems reviewed and are negative.   Physical Exam Updated Vital Signs BP (!) 164/86   Pulse 94   Temp 99.3 F (37.4 C) (Oral)   Resp 18   Ht 5\' 4"  (1.626 m)   Wt 121.1 kg   LMP 10/09/2014   SpO2 100%   BMI 45.83 kg/m   Physical Exam Vitals and nursing note reviewed.  Constitutional:      General: She is not in acute distress.    Appearance: She is well-developed.  HENT:     Head: Normocephalic and atraumatic.     Mouth/Throat:     Mouth: Mucous membranes are dry.  Eyes:     Extraocular Movements: Extraocular movements intact.     Conjunctiva/sclera: Conjunctivae normal.     Pupils: Pupils are equal, round, and reactive to light.  Cardiovascular:     Rate and Rhythm: Normal rate and regular rhythm.     Heart sounds: Normal heart sounds. No murmur heard.   Pulmonary:     Effort: Pulmonary effort is normal. No respiratory distress.     Breath sounds: Normal breath sounds. No wheezing, rhonchi or rales.  Abdominal:     General: Bowel sounds are normal.     Palpations: Abdomen is soft.     Tenderness: There is no abdominal tenderness. There is no guarding or rebound.  Musculoskeletal:     Cervical back: Neck supple.  Skin:    General: Skin is warm and dry.  Neurological:     Mental Status: She is alert.  Comments: Mental Status:  Alert, thought content appropriate, able to give a coherent history. Speech fluent without evidence of aphasia. Able to follow 2 step commands without difficulty.  Cranial Nerves:  II: pupils equal, round, reactive to light III,IV, VI: ptosis not present, extra-ocular  motions intact bilaterally  V,VII: smile symmetric, facial light touch sensation equal VIII: hearing grossly normal to voice  X: uvula elevates symmetrically  XI: bilateral shoulder shrug symmetric and strong XII: midline tongue extension without fassiculations Motor:  Normal tone. 5/5 strength of BUE and BLE major muscle groups including strong and equal grip strength and dorsiflexion/plantar flexion Sensory: light touch normal in all extremities. Gait: normal gait and balance.       ED Results / Procedures / Treatments   Labs (all labs ordered are listed, but only abnormal results are displayed) Labs Reviewed  URINALYSIS, ROUTINE W REFLEX MICROSCOPIC - Abnormal; Notable for the following components:      Result Value   Color, Urine STRAW (*)    Hgb urine dipstick SMALL (*)    Bacteria, UA RARE (*)    All other components within normal limits  CBC WITH DIFFERENTIAL/PLATELET - Abnormal; Notable for the following components:   WBC 15.6 (*)    Neutro Abs 12.6 (*)    Abs Immature Granulocytes 0.11 (*)    All other components within normal limits  BASIC METABOLIC PANEL  PREGNANCY, URINE  TSH  POC URINE PREG, ED    EKG None  Radiology No results found.  Procedures Procedures (including critical care time)  Medications Ordered in ED Medications  sodium chloride 0.9 % bolus 2,000 mL (2,000 mLs Intravenous New Bag/Given 04/18/20 1955)    ED Course  I have reviewed the triage vital signs and the nursing notes.  Pertinent labs & imaging results that were available during my care of the patient were reviewed by me and considered in my medical decision making (see chart for details).    MDM Rules/Calculators/A&P                          44 y/o female presenting for evaluation of near syncope starting yesterday.  Blood pressure was noted to be 110 at her neurologist office which she states is low for her so she came to the ED.  Vital signs are within normal limits.   Orthostatics completed which showed elevated heart rate with standing otherwise she did not have any hypotension.  She is actually been hypertensive while here in the ED.  Reviewed/interpreted labs CBC revealed leukocytosis of 15,000, no anemia.  -Leukocytosis likely related to her current steroid use BMP with normal electrolytes and kidney function TSH is normal UA without evidence of urinary tract infection Pregnancy test negative  EKG is nonischemic, normal sinus rhythm without arrhythmia.  Patient has a normal neurologic exam.  She denies chest pain or shortness of breath to suggest cardiac pulmonary cause of her symptoms.  She does not have any infectious symptoms.  She later admits that she has not been drinking a lot of water for the last few days and has been running a lot of errands so she could be dehydrated.  She is clinically looks dehydrated on my exam and given her orthostatic vital signs I suspect this is the culprit of her symptoms.  She was given IV fluids in the ED.  She remained stable on reassessment.  Feel she is appropriate for discharge with close PCP follow-up and strict  return precautions.  She voices understanding the plan and reasons to return.  Questions answered.  Patient stable for discharge.  Final Clinical Impression(s) / ED Diagnoses Final diagnoses:  Dehydration    Rx / DC Orders ED Discharge Orders    None       Bishop Dublin 04/18/20 2108    Noemi Chapel, MD 04/19/20 [phone]

## 2020-04-18 NOTE — Discharge Instructions (Signed)
Make sure to stay well hydrated.   Please follow up with your primary care provider within 5-7 days for re-evaluation of your symptoms. If you do not have a primary care provider, information for a healthcare clinic has been provided for you to make arrangements for follow up care. Please return to the emergency department for any new or worsening symptoms.

## 2020-04-18 NOTE — ED Triage Notes (Signed)
Pt c/o of weakness, dizziness and hypotension since yesterday

## 2020-04-20 ENCOUNTER — Ambulatory Visit (INDEPENDENT_AMBULATORY_CARE_PROVIDER_SITE_OTHER): Payer: BC Managed Care – PPO | Admitting: Endocrinology

## 2020-04-20 ENCOUNTER — Encounter: Payer: Self-pay | Admitting: Endocrinology

## 2020-04-20 ENCOUNTER — Other Ambulatory Visit: Payer: Self-pay

## 2020-04-20 VITALS — BP 148/96 | HR 90 | Wt 263.0 lb

## 2020-04-20 DIAGNOSIS — R739 Hyperglycemia, unspecified: Secondary | ICD-10-CM

## 2020-04-20 DIAGNOSIS — E538 Deficiency of other specified B group vitamins: Secondary | ICD-10-CM | POA: Diagnosis not present

## 2020-04-20 DIAGNOSIS — E559 Vitamin D deficiency, unspecified: Secondary | ICD-10-CM

## 2020-04-20 LAB — POCT GLYCOSYLATED HEMOGLOBIN (HGB A1C): Hemoglobin A1C: 5.9 % — AB (ref 4.0–5.6)

## 2020-04-20 NOTE — Progress Notes (Signed)
Subjective:    Patient ID: Heather Fry, female    DOB: 19-Jun-1976, 44 y.o.   MRN: [id number]  HPI Pt returns for f/u of postsurgical hypothyroidism (she had thyroidectomy 1/20, for hyperthyroidism; she was rx'ed synthroid soon thereafter).   She also has mild postsurgical hypoparathyroidism (she no longer takes Ca++, but she takes vit-D, 10,000 units/day).   She also has hypomagnesemia (she takes 250 mg qd).   She also has hypoglycemia.   She reports ongoing sxs (these have been normal: serum catechols, cortisol, and FSH/LH; she has had TAH but not BSO).  She was seen in ER 2 days ago, for generalized weakness.  Rheumatologist rxs medrol 4 mg qd.   She takes B-12 as rx'ed.      Past Medical History:  Diagnosis Date  . Anxiety   . Arthritis   . Depression   . Diverticulosis   . GERD (gastroesophageal reflux disease)   . Graves disease   . Internal hemorrhoids   . Thyroid disease   . Uterine fibroid     Past Surgical History:  Procedure Laterality Date  . ABDOMINAL HYSTERECTOMY     Per patient, uterine fibroids.  . ABDOMINAL HYSTERECTOMY    . CHOLECYSTECTOMY N/A 11/06/2014   Procedure: LAPAROSCOPIC CHOLECYSTECTOMY;  Surgeon: Jamesetta So, MD;  Location: [facility];  Service: General;  Laterality: N/A;  . COLONOSCOPY N/A 03/04/2017   Dr. Gala Romney: Hemorrhoids, diverticulosis, benign polyp without adenomatous changes.  Next colonoscopy 10 years  . DILATION AND CURETTAGE OF UTERUS    . ESOPHAGOGASTRODUODENOSCOPY N/A 10/02/2014   Dr. Gala Romney: fundic gland polyps, hiatal hernia  . MOUTH SURGERY     extraction of teeth  . POLYPECTOMY  03/04/2017   Procedure: POLYPECTOMY;  Surgeon: Daneil Dolin, MD;  Location: [facility];  Service: Endoscopy;;  colon  . THYROIDECTOMY N/A 09/30/2018   Procedure: TOTAL THYROIDECTOMY;  Surgeon: Armandina Gemma, MD;  Location: [facility];  Service: General;  Laterality: N/A;  . TUBAL LIGATION      Social History   Socioeconomic History  . Marital status:  Married    Spouse name: Not on file  . Number of children: Not on file  . Years of education: Not on file  . Highest education level: Not on file  Occupational History  . Not on file  Tobacco Use  . Smoking status: Former Smoker    Packs/day: 1.00    Years: 5.00    Pack years: 5.00    Types: Cigarettes    Quit date: 11/03/1997    Years since quitting: 22.4  . Smokeless tobacco: Never Used  . Tobacco comment: 1 pack 1 week  Vaping Use  . Vaping Use: Never used  Substance and Sexual Activity  . Alcohol use: No    Alcohol/week: 0.0 standard drinks  . Drug use: No  . Sexual activity: Yes    Birth control/protection: Surgical  Other Topics Concern  . Not on file  Social History Narrative  . Not on file   Social Determinants of Health   Financial Resource Strain:   . Difficulty of Paying Living Expenses:   Food Insecurity:   . Worried About Charity fundraiser in the Last Year:   . Arboriculturist in the Last Year:   Transportation Needs:   . Film/video editor (Medical):   Marland Kitchen Lack of Transportation (Non-Medical):   Physical Activity:   . Days of Exercise per Week:   . Minutes of  Exercise per Session:   Stress:   . Feeling of Stress :   Social Connections:   . Frequency of Communication with Friends and Family:   . Frequency of Social Gatherings with Friends and Family:   . Attends Religious Services:   . Active Member of Clubs or Organizations:   . Attends Archivist Meetings:   Marland Kitchen Marital Status:   Intimate Partner Violence:   . Fear of Current or Ex-Partner:   . Emotionally Abused:   Marland Kitchen Physically Abused:   . Sexually Abused:     Current Outpatient Medications on File Prior to Visit  Medication Sig Dispense Refill  . acetaminophen (TYLENOL) 500 MG tablet Take 1,000 mg by mouth daily as needed for moderate pain.    Marland Kitchen albuterol (VENTOLIN HFA) 108 (90 Base) MCG/ACT inhaler Inhale 2 puffs into the lungs every 6 (six) hours as needed. 18 g 11  .  budesonide-formoterol (SYMBICORT) 160-4.5 MCG/ACT inhaler Inhale 2 puffs into the lungs in the morning and at bedtime. 1 Inhaler 11  . Cholecalciferol (VITAMIN D3) 125 MCG (5000 UT) CAPS Take 1 capsule (5,000 Units total) by mouth daily. (Patient taking differently: Take 10,000 Units by mouth at bedtime. ) 30 capsule 11  . docusate sodium (COLACE) 100 MG capsule Take 100 mg by mouth at bedtime.    Marland Kitchen escitalopram (LEXAPRO) 20 MG tablet Take 1 tablet (20 mg total) by mouth at bedtime. 30 tablet 5  . Magnesium 250 MG TABS Take 250 mg by mouth at bedtime.     . methylPREDNISolone (MEDROL) 4 MG tablet Take 4 mg by mouth in the morning and at bedtime.    . montelukast (SINGULAIR) 10 MG tablet Take 1 tablet (10 mg total) by mouth at bedtime. 30 tablet 11  . NON FORMULARY at bedtime. CPAP at [facility]     . omeprazole (PRILOSEC) 20 MG capsule Take 20 mg by mouth 2 (two) times daily before a meal.    . RESTASIS 0.05 % ophthalmic emulsion Place 1 drop into both eyes 2 (two) times daily.    . simethicone (PHAZYME) 125 MG chewable tablet Chew 125-250 mg by mouth every 6 (six) hours as needed for flatulence.     . sucralfate (CARAFATE) 1 g tablet Take 1 g by mouth 4 (four) times daily.    Marland Kitchen SYNTHROID 150 MCG tablet Take 1 tablet (150 mcg total) by mouth daily before breakfast. 90 tablet 3  . Tiotropium Bromide Monohydrate (SPIRIVA RESPIMAT) 1.25 MCG/ACT AERS Inhale 2 puffs into the lungs daily. 4 g 11  . vitamin B-12 (CYANOCOBALAMIN) 1000 MCG tablet Take 1,000 mcg by mouth at bedtime.    . Wheat Dextrin (BENEFIBER DRINK MIX PO) Take 1 Dose by mouth 3 (three) times daily.     . cetirizine (ZYRTEC ALLERGY) 10 MG tablet Take 1 tablet (10 mg total) by mouth daily. (Patient not taking: Reported on 04/20/2020) 30 tablet 11  . fluticasone (FLONASE) 50 MCG/ACT nasal spray Place 2 sprays into both nostrils daily. (Patient taking differently: Place 2 sprays into both nostrils at bedtime. ) 16 g 2  . gabapentin (NEURONTIN) 300  MG capsule Take 1 capsule (300 mg total) by mouth 2 (two) times daily. (Patient not taking: Reported on 01/30/2020) 60 capsule 1  . prednisoLONE acetate (PRED FORTE) 1 % ophthalmic suspension Place 1 drop into both eyes daily.     No current facility-administered medications on file prior to visit.    Allergies  Allergen Reactions  .  Levofloxacin Other (See Comments)    Pt can not have due to taking Lexapro   . Toradol [Ketorolac Tromethamine] Anaphylaxis and Hives  . Penicillins Other (See Comments)    Loopy, childhood allergy      Family History  Problem Relation Age of Onset  . Hypertension Father   . Diabetes Father   . Depression Other   . Uterine cancer Mother        ?cervical cancer  . Uterine cancer Sister        suicide  . Colon cancer Paternal Aunt 13  . Thyroid disease Neg Hx     BP (!) 148/96   Pulse 90   Wt (!) 263 lb (119.3 kg)   LMP 10/09/2014   SpO2 98%   BMI 45.14 kg/m    Review of Systems Denies fever/n/v    Objective:   Physical Exam VITAL SIGNS:  See vs page.  GENERAL: no distress.   NECK: There is no palpable thyroid enlargement.  No thyroid nodule is palpable.  No palpable lymphadenopathy at the anterior neck.  EXT: trace bilat leg edema.     Lab Results  Component Value Date   TSH 2.133 04/18/2020   Lab Results  Component Value Date   CREATININE 0.67 04/18/2020   BUN 10 04/18/2020   NA 139 04/18/2020   K 4.0 04/18/2020   CL 101 04/18/2020   CO2 25 04/18/2020   Lab Results  Component Value Date   PTH 25 12/20/2019   PTH Comment 12/20/2019   CALCIUM 10.3 (H) 04/20/2020   CAION 1.25 01/19/2020   PHOS 4.3 04/20/2020    Lab Results  Component Value Date   HGBA1C 5.9 (A) 04/20/2020       Assessment & Plan:  Hyperglycemia, improved.  HTN: is noted today.  Generalized weakness: no endocrine cause is found B-12 def: recheck today Hypothyroidism: well-replaced.  Please continue the same medication  Patient Instructions   Your blood pressure is high today.  Please see your primary care provider soon, to have it rechecked Blood tests are requested for you today.  We'll let you know about the results.  Please come back for a follow-up appointment in 3-4 months

## 2020-04-20 NOTE — Patient Instructions (Addendum)
Your blood pressure is high today.  Please see your primary care provider soon, to have it rechecked Blood tests are requested for you today.  We'll let you know about the results.  Please come back for a follow-up appointment in 3-4 months

## 2020-04-22 ENCOUNTER — Encounter (HOSPITAL_COMMUNITY): Payer: Self-pay | Admitting: Emergency Medicine

## 2020-04-22 ENCOUNTER — Emergency Department (HOSPITAL_COMMUNITY): Payer: BC Managed Care – PPO

## 2020-04-22 ENCOUNTER — Emergency Department (HOSPITAL_COMMUNITY)
Admission: EM | Admit: 2020-04-22 | Discharge: 2020-04-22 | Disposition: A | Discharge status: Home / Self Care | Payer: BC Managed Care – PPO | Attending: Emergency Medicine | Admitting: Emergency Medicine

## 2020-04-22 ENCOUNTER — Other Ambulatory Visit: Payer: Self-pay

## 2020-04-22 DIAGNOSIS — R197 Diarrhea, unspecified: Secondary | ICD-10-CM | POA: Insufficient documentation

## 2020-04-22 DIAGNOSIS — R1013 Epigastric pain: Secondary | ICD-10-CM | POA: Diagnosis not present

## 2020-04-22 DIAGNOSIS — Z87891 Personal history of nicotine dependence: Secondary | ICD-10-CM | POA: Insufficient documentation

## 2020-04-22 DIAGNOSIS — E039 Hypothyroidism, unspecified: Secondary | ICD-10-CM | POA: Insufficient documentation

## 2020-04-22 DIAGNOSIS — R109 Unspecified abdominal pain: Secondary | ICD-10-CM | POA: Diagnosis not present

## 2020-04-22 DIAGNOSIS — R531 Weakness: Secondary | ICD-10-CM | POA: Diagnosis not present

## 2020-04-22 LAB — COMPREHENSIVE METABOLIC PANEL
ALT: 24 U/L (ref 0–44)
AST: 16 U/L (ref 15–41)
Albumin: 4 g/dL (ref 3.5–5.0)
Alkaline Phosphatase: 47 U/L (ref 38–126)
Anion gap: 10 (ref 5–15)
BUN: 7 mg/dL (ref 6–20)
CO2: 24 mmol/L (ref 22–32)
Calcium: 8.7 mg/dL — ABNORMAL LOW (ref 8.9–10.3)
Chloride: 103 mmol/L (ref 98–111)
Creatinine, Ser: 0.68 mg/dL (ref 0.44–1.00)
GFR calc Af Amer: >60 mL/min (ref >60)
GFR calc non Af Amer: >60 mL/min (ref >60)
Glucose, Bld: 108 mg/dL — ABNORMAL HIGH (ref 70–99)
Potassium: 3.5 mmol/L (ref 3.5–5.1)
Sodium: 137 mmol/L (ref 135–145)
Total Bilirubin: 0.4 mg/dL (ref 0.3–1.2)
Total Protein: 7.7 g/dL (ref 6.5–8.1)

## 2020-04-22 LAB — CBC WITH DIFFERENTIAL/PLATELET
Abs Immature Granulocytes: 0.06 10*3/uL (ref 0.00–0.07)
Basophils Absolute: 0 10*3/uL (ref 0.0–0.1)
Basophils Relative: 0 %
Eosinophils Absolute: 0 10*3/uL (ref 0.0–0.5)
Eosinophils Relative: 0 %
HCT: 40.1 % (ref 36.0–46.0)
Hemoglobin: 13.2 g/dL (ref 12.0–15.0)
Immature Granulocytes: 1 %
Lymphocytes Relative: 13 %
Lymphs Abs: 1.7 10*3/uL (ref 0.7–4.0)
MCH: 29.9 pg (ref 26.0–34.0)
MCHC: 32.9 g/dL (ref 30.0–36.0)
MCV: 90.7 fL (ref 80.0–100.0)
Monocytes Absolute: 0.6 10*3/uL (ref 0.1–1.0)
Monocytes Relative: 4 %
Neutro Abs: 10.9 10*3/uL — ABNORMAL HIGH (ref 1.7–7.7)
Neutrophils Relative %: 82 %
Platelets: 278 10*3/uL (ref 150–400)
RBC: 4.42 MIL/uL (ref 3.87–5.11)
RDW: 15 % (ref 11.5–15.5)
WBC: 13.3 10*3/uL — ABNORMAL HIGH (ref 4.0–10.5)
nRBC: 0 % (ref 0.0–0.2)

## 2020-04-22 LAB — URINALYSIS, ROUTINE W REFLEX MICROSCOPIC
Bilirubin Urine: NEGATIVE
Glucose, UA: NEGATIVE mg/dL
Hgb urine dipstick: NEGATIVE
Ketones, ur: NEGATIVE mg/dL
Leukocytes,Ua: NEGATIVE
Nitrite: NEGATIVE
Protein, ur: NEGATIVE mg/dL
Specific Gravity, Urine: 1.011 (ref 1.005–1.030)
pH: 8 (ref 5.0–8.0)

## 2020-04-22 IMAGING — DX DG ABDOMEN ACUTE W/ 1V CHEST
4 series · 4 of 4 positions shown · non-contrast
Comparison: Chest radiograph dated [DATE].

CLINICAL DATA: 44-year-old female with right-sided abdominal pain
and diarrhea.

EXAM:
DG ABDOMEN ACUTE W/ 1V CHEST

[chest pa]
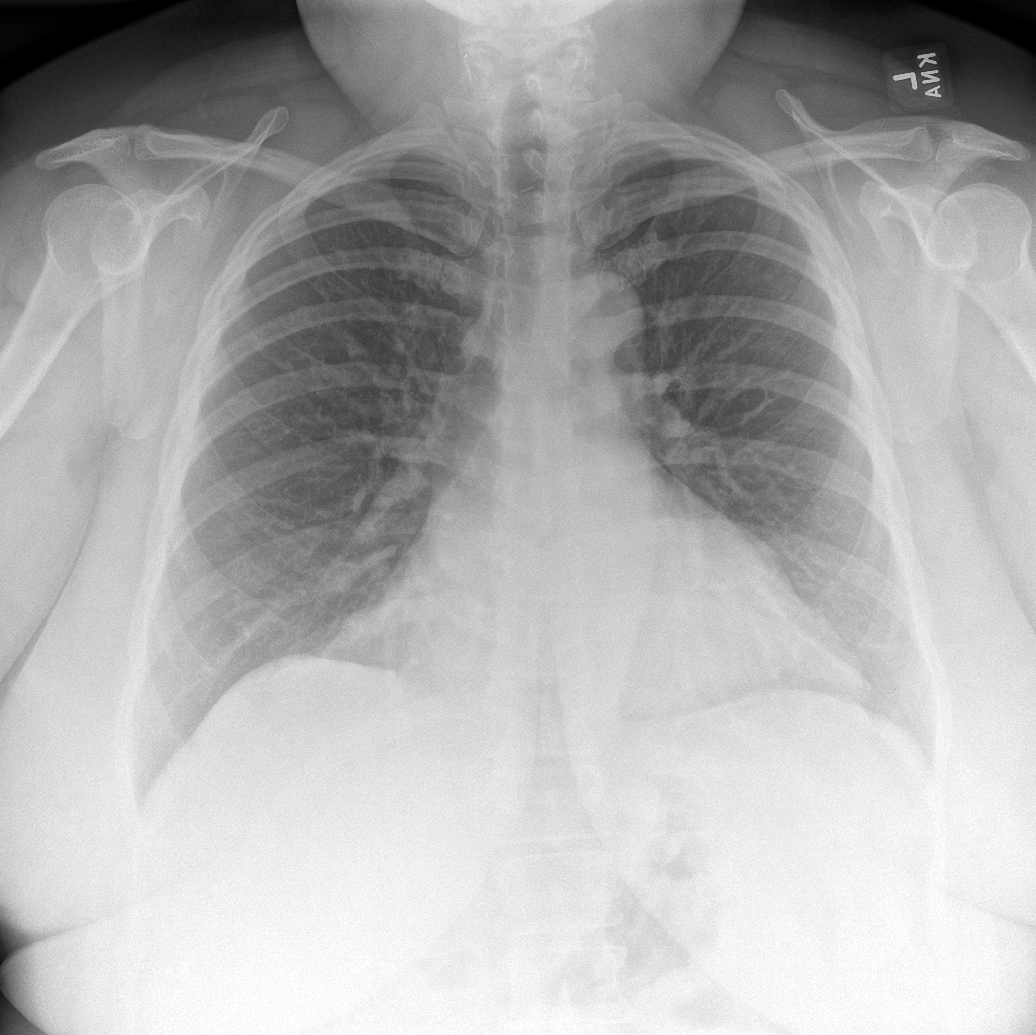

[abdomen erect]
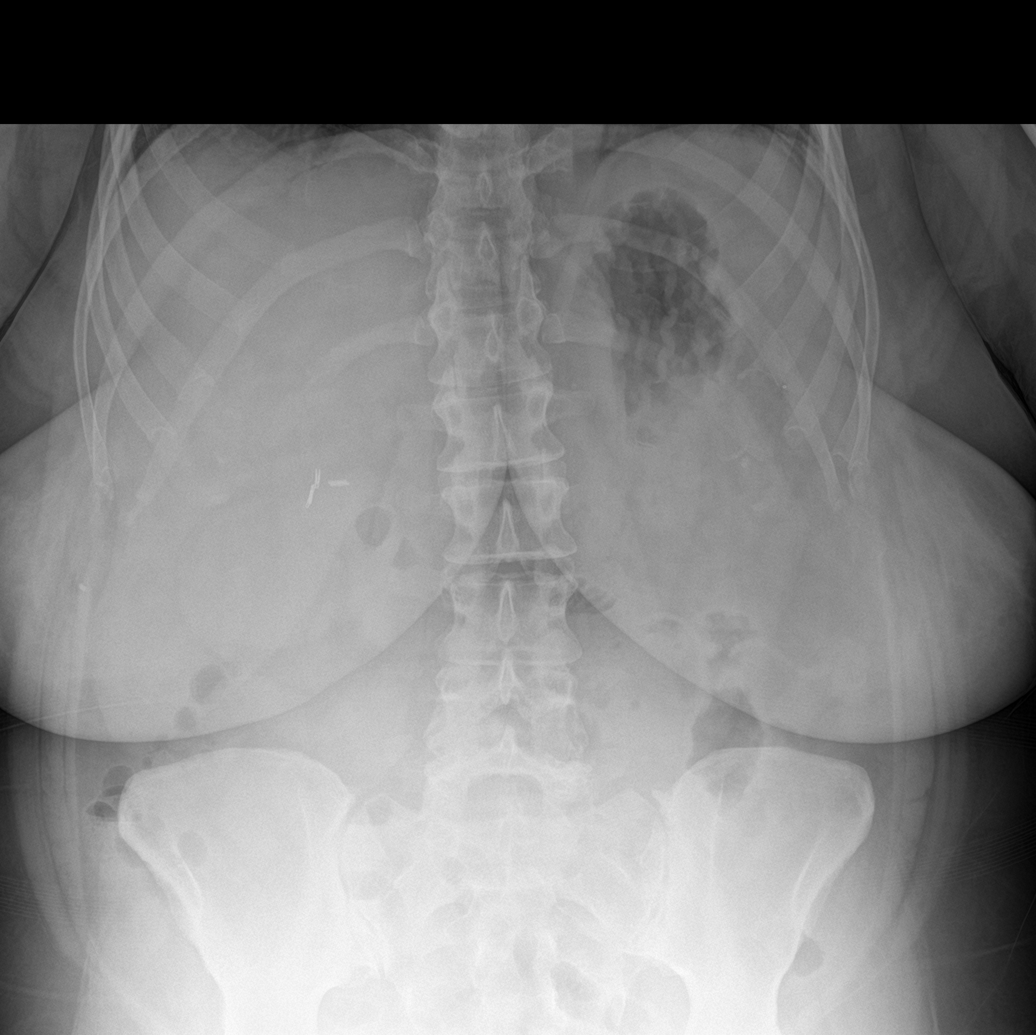

[abdomen supine (1 of 2)]
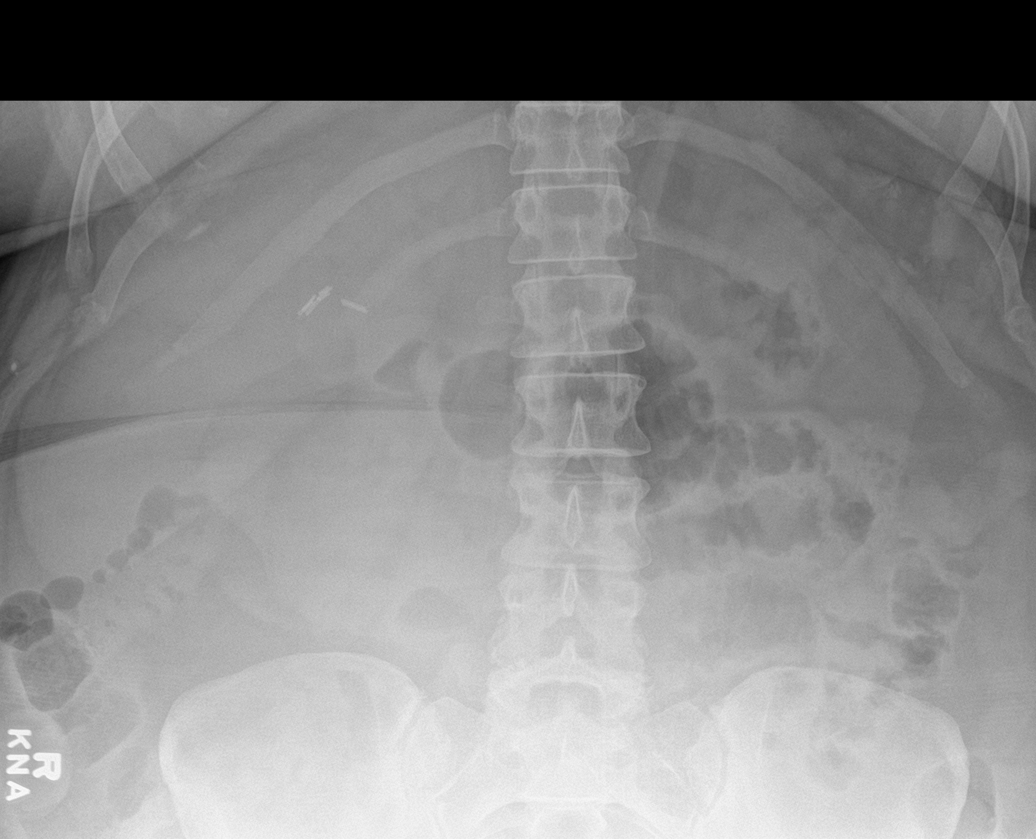

[abdomen supine (2 of 2)]
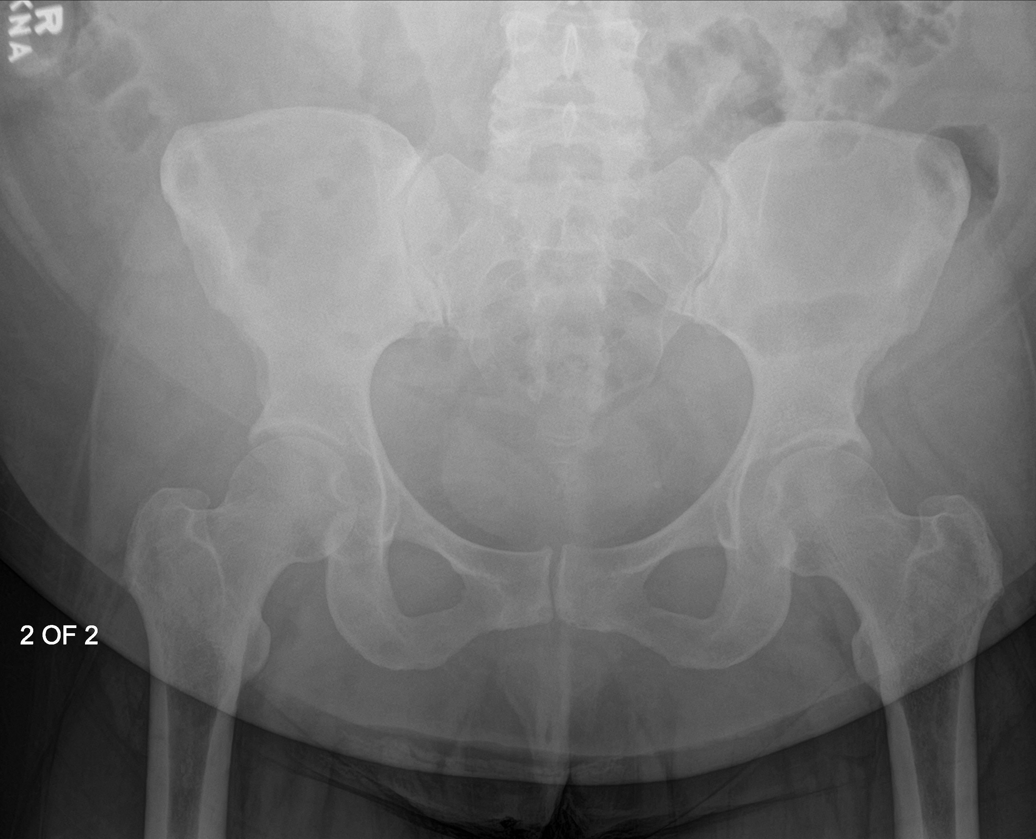

[4 of 4 positions shown; findings below may reference images not displayed]

FINDINGS: The lungs are clear. There is no pleural effusion pneumothorax. The
cardiac silhouette is within limits.

There is no bowel dilatation or evidence of obstruction. No free air
or radiopaque calculi. Right upper quadrant cholecystectomy clips.
The osseous structures and soft tissues are grossly unremarkable.
IMPRESSION: Negative abdominal radiographs. No acute cardiopulmonary disease.

## 2020-04-22 MED ORDER — ONDANSETRON HCL 4 MG/2ML IJ SOLN
4.0000 mg | Freq: Once | INTRAMUSCULAR | Status: AC
  Administered 2020-04-22: 4 mg via INTRAVENOUS
  Filled 2020-04-22: qty 2

## 2020-04-22 MED ORDER — ONDANSETRON 4 MG PO TBDP: ORAL_TABLET | ORAL | 0 refills | Status: DC

## 2020-04-22 MED ORDER — SODIUM CHLORIDE 0.9 % IV BOLUS
1000.0000 mL | Freq: Once | INTRAVENOUS | Status: AC
  Administered 2020-04-22: 1000 mL via INTRAVENOUS

## 2020-04-22 MED ORDER — PANTOPRAZOLE SODIUM 40 MG IV SOLR
40.0000 mg | Freq: Once | INTRAVENOUS | Status: AC
  Administered 2020-04-22: 40 mg via INTRAVENOUS
  Filled 2020-04-22: qty 40

## 2020-04-22 MED ORDER — DICYCLOMINE HCL 20 MG PO TABS
ORAL_TABLET | ORAL | 0 refills | Status: DC
Start: 2020-04-22 — End: 2020-05-08

## 2020-04-22 NOTE — ED Provider Notes (Signed)
[name] Provider Note   CSN: [id number] Arrival date & time: 04/22/20  1205     History Chief Complaint  Patient presents with  . Diarrhea    Heather Fry is a 44 y.o. female.  Patient complains of epigastric discomfort and some diarrhea.  No nausea no vomiting.  Patient also states that she had labs done and her endocrinologist office and her B12 level was elevated.  She stopped taking it Thursday.  The history is provided by the patient and medical records. No language interpreter was used.  Diarrhea Quality:  Semi-solid Severity:  Mild Onset quality:  Sudden Timing:  Constant Progression:  Unchanged Relieved by:  Nothing Worsened by:  Nothing Ineffective treatments:  None tried Associated symptoms: abdominal pain   Associated symptoms: no headaches        Past Medical History:  Diagnosis Date  . Anxiety   . Arthritis   . Depression   . Diverticulosis   . GERD (gastroesophageal reflux disease)   . Graves disease   . Internal hemorrhoids   . Thyroid disease   . Uterine fibroid     Patient Active Problem List   Diagnosis Date Noted  . Sensory disturbance 01/20/2020  . Atypical chest pain 01/20/2020  . Obesity, Class III, BMI 40-49.9 (morbid obesity) ([place]) 01/20/2020  . Weakness 01/19/2020  . Hyperglycemia 10/04/2019  . Myalgia 09/05/2019  . Vitamin B 12 deficiency 07/28/2019  . Right sided numbness 06/29/2019  . Constipation 06/15/2019  . Elevated lipase 06/15/2019  . Hypomagnesemia 03/29/2019  . Leukocytosis 03/29/2019  . Abdominal pain 02/10/2019  . Nausea without vomiting 02/10/2019  . Tingling of face 11/30/2018  . Flushing 11/30/2018  . Vitamin D deficiency 10/06/2018  . GERD (gastroesophageal reflux disease) 01/28/2017  . Rectal bleeding 01/28/2017  . 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 Hypothyroidism 04/14/2016  . Gastric polyp   . RUQ abdominal pain 09/06/2014  . Excessive or frequent menstruation 01/13/2013  .  Depression     Past Surgical History:  Procedure Laterality Date  . ABDOMINAL HYSTERECTOMY     Per patient, uterine fibroids.  . ABDOMINAL HYSTERECTOMY    . CHOLECYSTECTOMY N/A 11/06/2014   Procedure: LAPAROSCOPIC CHOLECYSTECTOMY;  Surgeon: Jamesetta So, MD;  Location: [facility] ORS;  Service: General;  Laterality: N/A;  . COLONOSCOPY N/A 03/04/2017   Dr. Gala Romney: Hemorrhoids, diverticulosis, benign polyp without adenomatous changes.  Next colonoscopy 10 years  . DILATION AND CURETTAGE OF UTERUS    . ESOPHAGOGASTRODUODENOSCOPY N/A 10/02/2014   Dr. Gala Romney: fundic gland polyps, hiatal hernia  . MOUTH SURGERY     extraction of teeth  . POLYPECTOMY  03/04/2017   Procedure: POLYPECTOMY;  Surgeon: Daneil Dolin, MD;  Location: [facility] END[facility] SUITE;  Service: Endoscopy;;  colon  . THYROIDECTOMY N/A 09/30/2018   Procedure: TOTAL THYROIDECTOMY;  Surgeon: Armandina Gemma, MD;  Location: [facility];  Service: General;  Laterality: N/A;  . TUBAL LIGATION       OB History    Gravida  2   Para  2   Term  2   Preterm      AB      Living  2     SAB      TAB      Ectopic      Multiple      Live Births              Family History  Problem Relation Age of Onset  . Hypertension Father   .  Diabetes Father   . Depression Other   . Uterine cancer Mother        ?cervical cancer  . Uterine cancer Sister        suicide  . Colon cancer Paternal Aunt 50  . Thyroid disease Neg Hx     Social History   Tobacco Use  . Smoking status: Former Smoker    Packs/day: 1.00    Years: 5.00    Pack years: 5.00    Types: Cigarettes    Quit date: 11/03/1997    Years since quitting: 22.4  . Smokeless tobacco: Never Used  . Tobacco comment: 1 pack 1 week  Vaping Use  . Vaping Use: Never used  Substance Use Topics  . Alcohol use: No    Alcohol/week: 0.0 standard drinks  . Drug use: No    Home Medications Prior to Admission medications   Medication Sig Start Date End Date Taking? Authorizing Provider   acetaminophen (TYLENOL) 500 MG tablet Take 1,000 mg by mouth daily as needed for moderate pain.    [provider]  albuterol (VENTOLIN HFA) 108 (90 Base) MCG/ACT inhaler Inhale 2 puffs into the lungs every 6 (six) hours as needed. 12/13/19   Julian Hy, DO  budesonide-formoterol (SYMBICORT) 160-4.5 MCG/ACT inhaler Inhale 2 puffs into the lungs in the morning and at bedtime. 12/13/19   Julian Hy, DO  cetirizine (ZYRTEC ALLERGY) 10 MG tablet Take 1 tablet (10 mg total) by mouth daily. Patient not taking: Reported on 04/20/2020 12/13/19   Noemi Chapel P, DO  Cholecalciferol (VITAMIN D3) 125 MCG (5000 UT) CAPS Take 1 capsule (5,000 Units total) by mouth daily. Patient taking differently: Take 10,000 Units by mouth at bedtime.  10/13/19   Renato Shin, MD  docusate sodium (COLACE) 100 MG capsule Take 100 mg by mouth at bedtime.    [provider]  escitalopram (LEXAPRO) 20 MG tablet Take 1 tablet (20 mg total) by mouth at bedtime. 01/26/19   Renato Shin, MD  fluticasone (FLONASE) 50 MCG/ACT nasal spray Place 2 sprays into both nostrils daily. Patient taking differently: Place 2 sprays into both nostrils at bedtime.  12/13/19   Julian Hy, DO  gabapentin (NEURONTIN) 300 MG capsule Take 1 capsule (300 mg total) by mouth 2 (two) times daily. Patient not taking: Reported on 01/30/2020 01/20/20   Orson Eva, MD  Magnesium 250 MG TABS Take 250 mg by mouth at bedtime.     [provider]  methylPREDNISolone (MEDROL) 4 MG tablet Take 4 mg by mouth in the morning and at bedtime.    [provider]  montelukast (SINGULAIR) 10 MG tablet Take 1 tablet (10 mg total) by mouth at bedtime. 12/13/19   Julian Hy, DO  NON FORMULARY at bedtime. CPAP at night     [provider]  omeprazole (PRILOSEC) 20 MG capsule Take 20 mg by mouth 2 (two) times daily before a meal.    [provider]  prednisoLONE acetate (PRED FORTE) 1 % ophthalmic suspension Place 1  drop into both eyes daily. 12/30/19   [provider]  RESTASIS 0.05 % ophthalmic emulsion Place 1 drop into both eyes 2 (two) times daily. 12/26/19   [provider]  simethicone (PHAZYME) 125 MG chewable tablet Chew 125-250 mg by mouth every 6 (six) hours as needed for flatulence.     [provider]  sucralfate (CARAFATE) 1 g tablet Take 1 g by mouth 4 (four) times daily. 01/18/20  [provider]  SYNTHROID 150 MCG tablet Take 1 tablet (150 mcg total) by mouth daily before breakfast. 10/04/19   Renato Shin, MD  Tiotropium Bromide Monohydrate (SPIRIVA RESPIMAT) 1.25 MCG/ACT AERS Inhale 2 puffs into the lungs daily. 01/05/20   Julian Hy, DO  vitamin B-12 (CYANOCOBALAMIN) 1000 MCG tablet Take 1,000 mcg by mouth at bedtime.    [provider]  Wheat Dextrin (BENEFIBER DRINK MIX PO) Take 1 Dose by mouth 3 (three) times daily.     [provider]    Allergies    Levofloxacin, Toradol [ketorolac tromethamine], and Penicillins  Review of Systems   Review of Systems  Constitutional: Negative for appetite change and fatigue.  HENT: Negative for congestion, ear discharge and sinus pressure.   Eyes: Negative for discharge.  Respiratory: Negative for cough.   Cardiovascular: Negative for chest pain.  Gastrointestinal: Positive for abdominal pain and diarrhea.  Genitourinary: Negative for frequency and hematuria.  Musculoskeletal: Negative for back pain.  Skin: Negative for rash.  Neurological: Negative for seizures and headaches.  Psychiatric/Behavioral: Negative for hallucinations.    Physical Exam Updated Vital Signs BP (!) 142/91   Pulse 85   Temp 98 F (36.7 C) (Oral)   Resp 18   Ht 5\' 4"  (1.626 m)   Wt (!) 120.7 kg   LMP 10/09/2014   SpO2 99%   BMI 45.66 kg/m   Physical Exam Vitals and nursing note reviewed.  Constitutional:      Appearance: She is well-developed.  HENT:     Head: Normocephalic.     Nose: Nose normal.   Eyes:     General: No scleral icterus.    Conjunctiva/sclera: Conjunctivae normal.  Neck:     Thyroid: No thyromegaly.  Cardiovascular:     Rate and Rhythm: Normal rate and regular rhythm.     Heart sounds: No murmur heard.  No friction rub. No gallop.   Pulmonary:     Breath sounds: No stridor. No wheezing or rales.  Chest:     Chest wall: No tenderness.  Abdominal:     General: There is no distension.     Tenderness: There is abdominal tenderness. There is no rebound.  Musculoskeletal:        General: Normal range of motion.     Cervical back: Neck supple.  Lymphadenopathy:     Cervical: No cervical adenopathy.  Skin:    Findings: No erythema or rash.  Neurological:     Mental Status: She is oriented to person, place, and time.     Motor: No abnormal muscle tone.     Coordination: Coordination normal.  Psychiatric:        Behavior: Behavior normal.     ED Results / Procedures / Treatments   Labs (all labs ordered are listed, but only abnormal results are displayed) Labs Reviewed  CBC WITH DIFFERENTIAL/PLATELET  COMPREHENSIVE METABOLIC PANEL    EKG None  Radiology No results found.  Procedures Procedures (including critical care time)  Medications Ordered in ED Medications  sodium chloride 0.9 % bolus 1,000 mL (1,000 mLs Intravenous New Bag/Given 04/22/20 1445)  pantoprazole (PROTONIX) injection 40 mg (40 mg Intravenous Given 04/22/20 1445)  ondansetron (ZOFRAN) injection 4 mg (4 mg Intravenous Given 04/22/20 1445)    ED Course  I have reviewed the triage vital signs and the nursing notes.  Pertinent labs & imaging results that were available during my care of the patient were reviewed by me and considered in  my medical decision making (see chart for details).    MDM Rules/Calculators/A&P                         Patient with mild epigastric discomfort and diarrhea.  Labs are remarkable for minimal elevated white count.  X-rays negative.  Patient will  continue with the protein positive better and try to take her Carafate 4 times a day.  She is only taking it once and she will follow-up with her doctor       This patient presents to the ED for concern of abdominal pain this involves an extensive number of treatment options, and is a complaint that carries with it a high risk of complications and morbidity.  The differential diagnosis includes gastritis gastroenteritis   Lab Tests:   I Ordered, reviewed, and interpreted labs, which included CBC chemistries which show mild elevation white count  Medicines ordered:   I ordered medication Protonix and Zofran  Imaging Studies ordered:   I ordered imaging studies which included abdominal plain films and  I independently visualized and interpreted imaging which showed unremarkable  Additional history obtained:   Additional history obtained from records  Previous records obtained and reviewed.  Consultations Obtained:    Reevaluation:  After the interventions stated above, I reevaluated the patient and found moderately improved  Critical Interventions:  .    Final Clinical Impression(s) / ED Diagnoses Final diagnoses:  None    Rx / DC Orders ED Discharge Orders    None       Milton Ferguson, MD 04/23/20 [phone]

## 2020-04-22 NOTE — ED Triage Notes (Signed)
Patient c/o nausea and diarrhea. Per patient seen here on Wednesday due to low blood pressure and weakness. Patient states that she now has nausea and diarrhea. Patient concerned she may have "B12" toxicity-per patient seen endocrinologist and had blood drawn, B12 levels have been extremely high per results on Saturday. Patient reports that she stopped taking B12 on Thursday. Patient states "I just don't feel right."

## 2020-04-22 NOTE — ED Notes (Signed)
Pt concerned about her B12 level , she got it tested on Friday  By Dr. Loanne Drilling and she saw results of 1900 pg/ml yesterday.

## 2020-04-22 NOTE — Discharge Instructions (Addendum)
Drink plenty of fluids and follow-up with your family doctor as planned Tuesday

## 2020-04-23 LAB — PTH, INTACT AND CALCIUM
Calcium: 10.3 mg/dL — ABNORMAL HIGH (ref 8.6–10.2)
PTH: 14 pg/mL (ref 14–64)

## 2020-04-23 LAB — MAGNESIUM: Magnesium: 1.9 mg/dL (ref 1.5–2.5)

## 2020-04-23 LAB — VITAMIN B12: Vitamin B-12: 1900 pg/mL — ABNORMAL HIGH (ref 200–1100)

## 2020-04-23 LAB — PHOSPHORUS: Phosphorus: 4.3 mg/dL (ref 2.5–4.5)

## 2020-04-23 LAB — VITAMIN D 25 HYDROXY (VIT D DEFICIENCY, FRACTURES): Vit D, 25-Hydroxy: 51 ng/mL (ref 30–100)

## 2020-04-24 DIAGNOSIS — E209 Hypoparathyroidism, unspecified: Secondary | ICD-10-CM | POA: Diagnosis not present

## 2020-04-24 DIAGNOSIS — E208 Other hypoparathyroidism: Secondary | ICD-10-CM | POA: Diagnosis not present

## 2020-04-25 ENCOUNTER — Telehealth (INDEPENDENT_AMBULATORY_CARE_PROVIDER_SITE_OTHER): Payer: BC Managed Care – PPO | Admitting: Endocrinology

## 2020-04-25 ENCOUNTER — Other Ambulatory Visit: Payer: Self-pay

## 2020-04-25 DIAGNOSIS — N342 Other urethritis: Secondary | ICD-10-CM | POA: Diagnosis not present

## 2020-04-25 DIAGNOSIS — E559 Vitamin D deficiency, unspecified: Secondary | ICD-10-CM

## 2020-04-25 DIAGNOSIS — R202 Paresthesia of skin: Secondary | ICD-10-CM | POA: Diagnosis not present

## 2020-04-25 DIAGNOSIS — I10 Essential (primary) hypertension: Secondary | ICD-10-CM | POA: Diagnosis not present

## 2020-04-25 DIAGNOSIS — Z6841 Body Mass Index (BMI) 40.0 and over, adult: Secondary | ICD-10-CM | POA: Diagnosis not present

## 2020-04-25 NOTE — Patient Instructions (Addendum)
Blood tests are requested for you today.  We'll let you know about the results.   Try reducing the magnesium to every other day.   You should minimize the Tums.   Please continue the carafate and simethicone for now, but take these apart from other meds.

## 2020-04-25 NOTE — Progress Notes (Signed)
Subjective:    Patient ID: Heather Fry, female    DOB: 01-09-1976, 44 y.o.   MRN: [id number]  HPI  telehealth visit today via doxy video visit.  Alternatives to telehealth are presented to this patient, and the patient agrees to the telehealth visit. Pt is advised of the cost of the visit, and agrees to this, also.   Patient is at home, and I am at the office.   Persons attending the telehealth visit: the patient and I.  Pt returns for f/u of postsurgical hypothyroidism (she had thyroidectomy 1/20, for hyperthyroidism; she was rx'ed synthroid soon thereafter).   She also has mild postsurgical hypoparathyroidism (she takes vit-D, 5000 units/day).   She also has hypomagnesemia (she takes 250 mg qd).   She also has hyperglycemia She reports ongoing sxs (these have been normal: serum catechols, cortisol, and FSH/LH; she has had TAH but not BSO).  She was seen in ER 2 days ago, for generalized weakness.  Rheumatologist rxs medrol 4 mg qd.   She takes B-12 as rx'ed.   When she had hypercalcemia last week, she was taking Vit-D, but no Ca++ supplement.  She has intermitt diarrhea. She was seen again in the ER with generalized weakness Past Medical History:  Diagnosis Date  . Anxiety   . Arthritis   . Depression   . Diverticulosis   . GERD (gastroesophageal reflux disease)   . Graves disease   . Internal hemorrhoids   . Thyroid disease   . Uterine fibroid     Past Surgical History:  Procedure Laterality Date  . ABDOMINAL HYSTERECTOMY     Per patient, uterine fibroids.  . ABDOMINAL HYSTERECTOMY    . CHOLECYSTECTOMY N/A 11/06/2014   Procedure: LAPAROSCOPIC CHOLECYSTECTOMY;  Surgeon: Jamesetta So, MD;  Location: [facility];  Service: General;  Laterality: N/A;  . COLONOSCOPY N/A 03/04/2017   Dr. Gala Romney: Hemorrhoids, diverticulosis, benign polyp without adenomatous changes.  Next colonoscopy 10 years  . DILATION AND CURETTAGE OF UTERUS    . ESOPHAGOGASTRODUODENOSCOPY N/A 10/02/2014   Dr.  Gala Romney: fundic gland polyps, hiatal hernia  . MOUTH SURGERY     extraction of teeth  . POLYPECTOMY  03/04/2017   Procedure: POLYPECTOMY;  Surgeon: Daneil Dolin, MD;  Location: [facility];  Service: Endoscopy;;  colon  . THYROIDECTOMY N/A 09/30/2018   Procedure: TOTAL THYROIDECTOMY;  Surgeon: Armandina Gemma, MD;  Location: [facility];  Service: General;  Laterality: N/A;  . TUBAL LIGATION      Social History   Socioeconomic History  . Marital status: Married    Spouse name: Not on file  . Number of children: Not on file  . Years of education: Not on file  . Highest education level: Not on file  Occupational History  . Not on file  Tobacco Use  . Smoking status: Former Smoker    Packs/day: 1.00    Years: 5.00    Pack years: 5.00    Types: Cigarettes    Quit date: 11/03/1997    Years since quitting: 22.4  . Smokeless tobacco: Never Used  . Tobacco comment: 1 pack 1 week  Vaping Use  . Vaping Use: Never used  Substance and Sexual Activity  . Alcohol use: No    Alcohol/week: 0.0 standard drinks  . Drug use: No  . Sexual activity: Yes    Birth control/protection: Surgical  Other Topics Concern  . Not on file  Social History Narrative  . Not on file  Social Determinants of Health   Financial Resource Strain:   . Difficulty of Paying Living Expenses:   Food Insecurity:   . Worried About Charity fundraiser in the Last Year:   . Arboriculturist in the Last Year:   Transportation Needs:   . Film/video editor (Medical):   Marland Kitchen Lack of Transportation (Non-Medical):   Physical Activity:   . Days of Exercise per Week:   . Minutes of Exercise per Session:   Stress:   . Feeling of Stress :   Social Connections:   . Frequency of Communication with Friends and Family:   . Frequency of Social Gatherings with Friends and Family:   . Attends Religious Services:   . Active Member of Clubs or Organizations:   . Attends Archivist Meetings:   Marland Kitchen Marital Status:    Intimate Partner Violence:   . Fear of Current or Ex-Partner:   . Emotionally Abused:   Marland Kitchen Physically Abused:   . Sexually Abused:     Current Outpatient Medications on File Prior to Visit  Medication Sig Dispense Refill  . acetaminophen (TYLENOL) 500 MG tablet Take 1,000 mg by mouth daily as needed for moderate pain.    Marland Kitchen albuterol (VENTOLIN HFA) 108 (90 Base) MCG/ACT inhaler Inhale 2 puffs into the lungs every 6 (six) hours as needed. 18 g 11  . budesonide-formoterol (SYMBICORT) 160-4.5 MCG/ACT inhaler Inhale 2 puffs into the lungs in the morning and at bedtime. 1 Inhaler 11  . cetirizine (ZYRTEC ALLERGY) 10 MG tablet Take 1 tablet (10 mg total) by mouth daily. (Patient not taking: Reported on 04/20/2020) 30 tablet 11  . Cholecalciferol (VITAMIN D3) 125 MCG (5000 UT) CAPS Take 1 capsule (5,000 Units total) by mouth daily. (Patient taking differently: Take 10,000 Units by mouth at bedtime. ) 30 capsule 11  . dicyclomine (BENTYL) 20 MG tablet Take 1 pill every 12 hours for abdominal cramping 20 tablet 0  . docusate sodium (COLACE) 100 MG capsule Take 100 mg by mouth at bedtime.    Marland Kitchen escitalopram (LEXAPRO) 20 MG tablet Take 1 tablet (20 mg total) by mouth at bedtime. 30 tablet 5  . fluticasone (FLONASE) 50 MCG/ACT nasal spray Place 2 sprays into both nostrils daily. (Patient taking differently: Place 2 sprays into both nostrils at bedtime. ) 16 g 2  . gabapentin (NEURONTIN) 300 MG capsule Take 1 capsule (300 mg total) by mouth 2 (two) times daily. (Patient not taking: Reported on 01/30/2020) 60 capsule 1  . Magnesium 250 MG TABS Take 250 mg by mouth at bedtime.     . methylPREDNISolone (MEDROL) 4 MG tablet Take 4 mg by mouth in the morning and at bedtime.    . montelukast (SINGULAIR) 10 MG tablet Take 1 tablet (10 mg total) by mouth at bedtime. 30 tablet 11  . NON FORMULARY at bedtime. CPAP at [facility]     . omeprazole (PRILOSEC) 20 MG capsule Take 20 mg by mouth 2 (two) times daily before a meal.     . ondansetron (ZOFRAN ODT) 4 MG disintegrating tablet 4mg  ODT q4 hours prn nausea/vomit 12 tablet 0  . prednisoLONE acetate (PRED FORTE) 1 % ophthalmic suspension Place 1 drop into both eyes daily.    . RESTASIS 0.05 % ophthalmic emulsion Place 1 drop into both eyes 2 (two) times daily.    . simethicone (PHAZYME) 125 MG chewable tablet Chew 125-250 mg by mouth every 6 (six) hours as needed for flatulence.     Marland Kitchen  sucralfate (CARAFATE) 1 g tablet Take 1 g by mouth 4 (four) times daily.    Marland Kitchen SYNTHROID 150 MCG tablet Take 1 tablet (150 mcg total) by mouth daily before breakfast. 90 tablet 3  . Tiotropium Bromide Monohydrate (SPIRIVA RESPIMAT) 1.25 MCG/ACT AERS Inhale 2 puffs into the lungs daily. 4 g 11  . vitamin B-12 (CYANOCOBALAMIN) 1000 MCG tablet Take 1,000 mcg by mouth at bedtime.    . Wheat Dextrin (BENEFIBER DRINK MIX PO) Take 1 Dose by mouth 3 (three) times daily.      No current facility-administered medications on file prior to visit.    Allergies  Allergen Reactions  . Levofloxacin Other (See Comments)    Pt can not have due to taking Lexapro   . Toradol [Ketorolac Tromethamine] Anaphylaxis and Hives  . Penicillins Other (See Comments)    Loopy, childhood allergy      Family History  Problem Relation Age of Onset  . Hypertension Father   . Diabetes Father   . Depression Other   . Uterine cancer Mother        ?cervical cancer  . Uterine cancer Sister        suicide  . Colon cancer Paternal Aunt 109  . Thyroid disease Neg Hx     LMP 10/09/2014   Review of Systems    Objective:   Physical Exam    Lab Results  Component Value Date   PTH 14 04/20/2020   CALCIUM 8.7 (L) 04/22/2020   CAION 1.25 01/19/2020   PHOS 4.3 04/20/2020   Lab Results  Component Value Date   TSH 2.133 04/18/2020   Lab Results  Component Value Date   CREATININE 0.68 04/22/2020   BUN 7 04/22/2020   NA 137 04/22/2020   K 3.5 04/22/2020   CL 103 04/22/2020   CO2 24 04/22/2020   Lab  Results  Component Value Date   HGBA1C 5.9 (A) 04/20/2020       Assessment & Plan:  Hypocalcemia.  It would take a change in hydration to cause this much change within a few days.   Weakness: I told pt These electrolyte changes do not explain sxs.  GERD: Simethicone and carafate might be affecting med absorption.  We discussed.  Pt says she cannot stop these.  Patient Instructions  Blood tests are requested for you today.  We'll let you know about the results.   Try reducing the magnesium to every other day.   You should minimize the Tums.   Please continue the carafate and simethicone for now, but take these apart from other meds.

## 2020-04-26 LAB — PTH, INTACT AND CALCIUM
Calcium: 9.9 mg/dL (ref 8.7–10.2)
PTH: 13 pg/mL — ABNORMAL LOW (ref 15–65)

## 2020-04-26 LAB — VITAMIN D 25 HYDROXY (VIT D DEFICIENCY, FRACTURES): Vit D, 25-Hydroxy: 61.7 ng/mL (ref 30.0–100.0)

## 2020-05-02 ENCOUNTER — Ambulatory Visit: Payer: BC Managed Care – PPO | Admitting: Endocrinology

## 2020-05-03 ENCOUNTER — Encounter (HOSPITAL_COMMUNITY): Payer: Self-pay | Admitting: *Deleted

## 2020-05-03 ENCOUNTER — Other Ambulatory Visit: Payer: Self-pay

## 2020-05-03 ENCOUNTER — Emergency Department (HOSPITAL_COMMUNITY)
Admission: EM | Admit: 2020-05-03 | Discharge: 2020-05-04 | Disposition: A | Discharge status: Home / Self Care | Payer: BC Managed Care – PPO | Attending: Emergency Medicine | Admitting: Emergency Medicine

## 2020-05-03 DIAGNOSIS — R5383 Other fatigue: Secondary | ICD-10-CM | POA: Diagnosis not present

## 2020-05-03 DIAGNOSIS — R1084 Generalized abdominal pain: Secondary | ICD-10-CM | POA: Diagnosis not present

## 2020-05-03 DIAGNOSIS — R531 Weakness: Secondary | ICD-10-CM | POA: Diagnosis not present

## 2020-05-03 DIAGNOSIS — I10 Essential (primary) hypertension: Secondary | ICD-10-CM | POA: Diagnosis not present

## 2020-05-03 DIAGNOSIS — N281 Cyst of kidney, acquired: Secondary | ICD-10-CM | POA: Diagnosis not present

## 2020-05-03 DIAGNOSIS — R197 Diarrhea, unspecified: Secondary | ICD-10-CM | POA: Diagnosis not present

## 2020-05-03 DIAGNOSIS — Z79899 Other long term (current) drug therapy: Secondary | ICD-10-CM | POA: Insufficient documentation

## 2020-05-03 DIAGNOSIS — N83291 Other ovarian cyst, right side: Secondary | ICD-10-CM | POA: Diagnosis not present

## 2020-05-03 DIAGNOSIS — Z87891 Personal history of nicotine dependence: Secondary | ICD-10-CM | POA: Insufficient documentation

## 2020-05-03 DIAGNOSIS — R0689 Other abnormalities of breathing: Secondary | ICD-10-CM | POA: Diagnosis not present

## 2020-05-03 DIAGNOSIS — K469 Unspecified abdominal hernia without obstruction or gangrene: Secondary | ICD-10-CM | POA: Diagnosis not present

## 2020-05-03 DIAGNOSIS — K439 Ventral hernia without obstruction or gangrene: Secondary | ICD-10-CM | POA: Diagnosis not present

## 2020-05-03 DIAGNOSIS — E860 Dehydration: Secondary | ICD-10-CM | POA: Diagnosis not present

## 2020-05-03 DIAGNOSIS — R110 Nausea: Secondary | ICD-10-CM | POA: Diagnosis not present

## 2020-05-03 LAB — CBC WITH DIFFERENTIAL/PLATELET
Abs Immature Granulocytes: 0.06 10*3/uL (ref 0.00–0.07)
Basophils Absolute: 0 10*3/uL (ref 0.0–0.1)
Basophils Relative: 0 %
Eosinophils Absolute: 0 10*3/uL (ref 0.0–0.5)
Eosinophils Relative: 0 %
HCT: 39.5 % (ref 36.0–46.0)
Hemoglobin: 12.7 g/dL (ref 12.0–15.0)
Immature Granulocytes: 1 %
Lymphocytes Relative: 18 %
Lymphs Abs: 1.9 10*3/uL (ref 0.7–4.0)
MCH: 29.6 pg (ref 26.0–34.0)
MCHC: 32.2 g/dL (ref 30.0–36.0)
MCV: 92.1 fL (ref 80.0–100.0)
Monocytes Absolute: 0.6 10*3/uL (ref 0.1–1.0)
Monocytes Relative: 5 %
Neutro Abs: 8.1 10*3/uL — ABNORMAL HIGH (ref 1.7–7.7)
Neutrophils Relative %: 76 %
Platelets: 259 10*3/uL (ref 150–400)
RBC: 4.29 MIL/uL (ref 3.87–5.11)
RDW: 15.1 % (ref 11.5–15.5)
WBC: 10.7 10*3/uL — ABNORMAL HIGH (ref 4.0–10.5)
nRBC: 0 % (ref 0.0–0.2)

## 2020-05-03 MED ORDER — SODIUM CHLORIDE 0.9 % IV BOLUS
1000.0000 mL | Freq: Once | INTRAVENOUS | Status: AC
  Administered 2020-05-04: 1000 mL via INTRAVENOUS

## 2020-05-03 MED ORDER — ONDANSETRON HCL 4 MG/2ML IJ SOLN
4.0000 mg | Freq: Once | INTRAMUSCULAR | Status: AC
  Administered 2020-05-04: 4 mg via INTRAVENOUS
  Filled 2020-05-03: qty 2

## 2020-05-03 MED ORDER — PANTOPRAZOLE SODIUM 40 MG IV SOLR
40.0000 mg | Freq: Once | INTRAVENOUS | Status: AC
  Administered 2020-05-04: 40 mg via INTRAVENOUS
  Filled 2020-05-03: qty 40

## 2020-05-03 NOTE — ED Triage Notes (Signed)
Pt c/o continued constant abd pain that is worse after eating, diarrhea, trouble swallowing,intermittent  numbness to face, waist, arms,  irregular blood pressure, pt states that she has been sick with these symptoms for the past month and has gotten worse.

## 2020-05-04 ENCOUNTER — Emergency Department (HOSPITAL_COMMUNITY): Payer: BC Managed Care – PPO

## 2020-05-04 DIAGNOSIS — Z1389 Encounter for screening for other disorder: Secondary | ICD-10-CM | POA: Diagnosis not present

## 2020-05-04 DIAGNOSIS — K469 Unspecified abdominal hernia without obstruction or gangrene: Secondary | ICD-10-CM | POA: Diagnosis not present

## 2020-05-04 DIAGNOSIS — N83291 Other ovarian cyst, right side: Secondary | ICD-10-CM | POA: Diagnosis not present

## 2020-05-04 DIAGNOSIS — N281 Cyst of kidney, acquired: Secondary | ICD-10-CM | POA: Diagnosis not present

## 2020-05-04 DIAGNOSIS — E063 Autoimmune thyroiditis: Secondary | ICD-10-CM | POA: Diagnosis not present

## 2020-05-04 DIAGNOSIS — R5383 Other fatigue: Secondary | ICD-10-CM | POA: Diagnosis not present

## 2020-05-04 DIAGNOSIS — K439 Ventral hernia without obstruction or gangrene: Secondary | ICD-10-CM | POA: Diagnosis not present

## 2020-05-04 DIAGNOSIS — I10 Essential (primary) hypertension: Secondary | ICD-10-CM | POA: Diagnosis not present

## 2020-05-04 LAB — URINALYSIS, ROUTINE W REFLEX MICROSCOPIC
Bilirubin Urine: NEGATIVE
Glucose, UA: NEGATIVE mg/dL
Hgb urine dipstick: NEGATIVE
Ketones, ur: 5 mg/dL — AB
Leukocytes,Ua: NEGATIVE
Nitrite: NEGATIVE
Protein, ur: NEGATIVE mg/dL
Specific Gravity, Urine: 1.008 (ref 1.005–1.030)
pH: 8 (ref 5.0–8.0)

## 2020-05-04 LAB — COMPREHENSIVE METABOLIC PANEL
ALT: 23 U/L (ref 0–44)
AST: 15 U/L (ref 15–41)
Albumin: 3.9 g/dL (ref 3.5–5.0)
Alkaline Phosphatase: 47 U/L (ref 38–126)
Anion gap: 13 (ref 5–15)
BUN: 6 mg/dL (ref 6–20)
CO2: 21 mmol/L — ABNORMAL LOW (ref 22–32)
Calcium: 9.1 mg/dL (ref 8.9–10.3)
Chloride: 100 mmol/L (ref 98–111)
Creatinine, Ser: 0.57 mg/dL (ref 0.44–1.00)
GFR calc Af Amer: >60 mL/min (ref >60)
GFR calc non Af Amer: >60 mL/min (ref >60)
Glucose, Bld: 97 mg/dL (ref 70–99)
Potassium: 3.2 mmol/L — ABNORMAL LOW (ref 3.5–5.1)
Sodium: 134 mmol/L — ABNORMAL LOW (ref 135–145)
Total Bilirubin: 0.4 mg/dL (ref 0.3–1.2)
Total Protein: 7.6 g/dL (ref 6.5–8.1)

## 2020-05-04 LAB — LIPASE, BLOOD: Lipase: 35 U/L (ref 11–51)

## 2020-05-04 LAB — HCG, SERUM, QUALITATIVE: Preg, Serum: NEGATIVE

## 2020-05-04 LAB — MAGNESIUM: Magnesium: 1.6 mg/dL — ABNORMAL LOW (ref 1.7–2.4)

## 2020-05-04 LAB — TROPONIN I (HIGH SENSITIVITY)
Troponin I (High Sensitivity): 4 ng/L (ref <18)
Troponin I (High Sensitivity): 4 ng/L (ref <18)

## 2020-05-04 IMAGING — CT CT ABD-PELV W/ CM
2 of 4 series · 16 of 46 positions shown, 18 images · IV contrast (Omnipaque or Isovue)
Comparison: Radiograph [DATE], CT [DATE], [DATE],
[DATE]

CLINICAL DATA: Epigastric pain

EXAM:
CT ABDOMEN AND PELVIS WITH CONTRAST
TECHNIQUE: Multidetector CT imaging of the abdomen and pelvis was performed
using the standard protocol following bolus administration of
intravenous contrast.
CONTRAST:  100mL OMNIPAQUE IOHEXOL 300 MG/ML  SOLN

[Series 2: axial st · axial · 0.92mm/px · z∈[+995,+1400]mm · 13 of 91 slices shown, 15 images]
[im 5/91  soft-tissue]
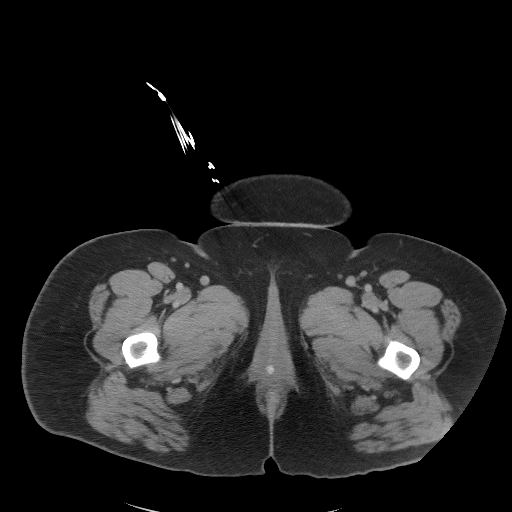
[im 5/91  bone]
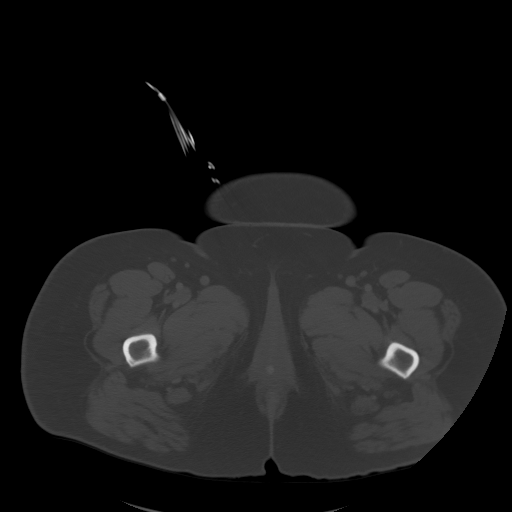
[im 13/91  soft-tissue]
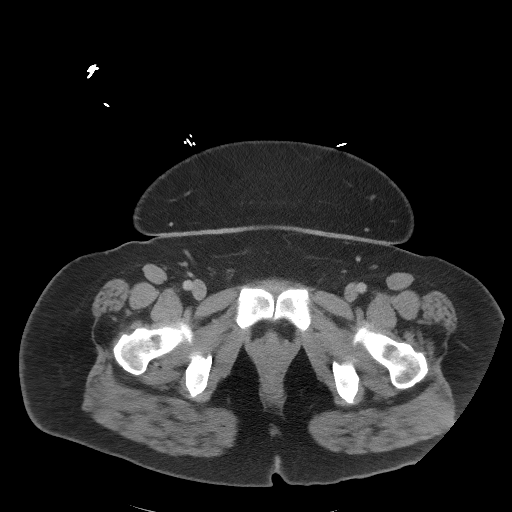
[im 21/91  soft-tissue]
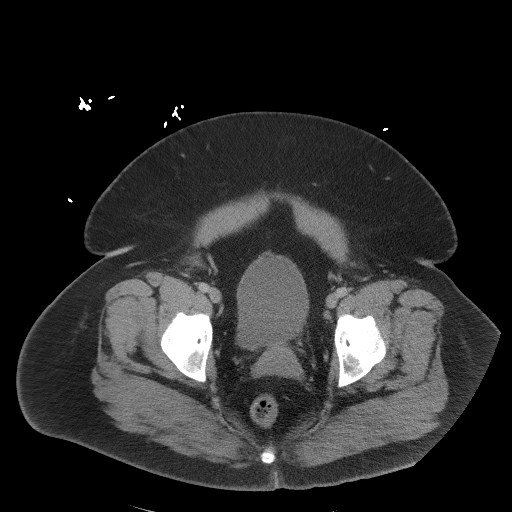
[im 25/91  soft-tissue]
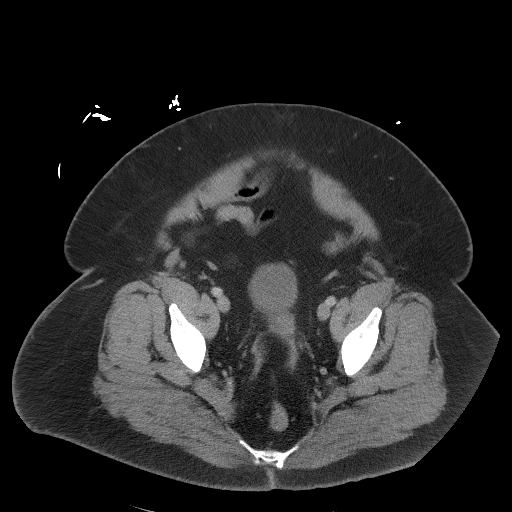
[im 33/91  soft-tissue]
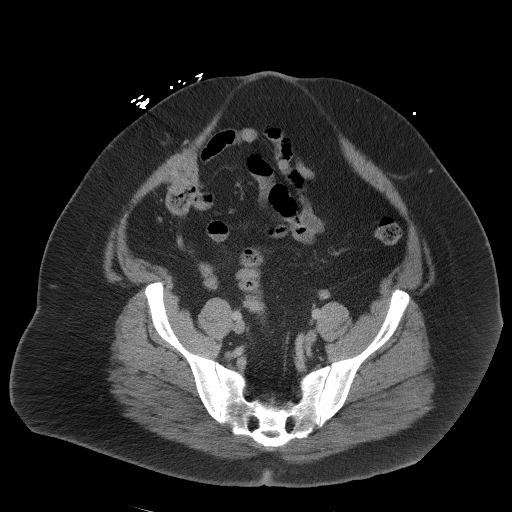
[im 37/91  soft-tissue]
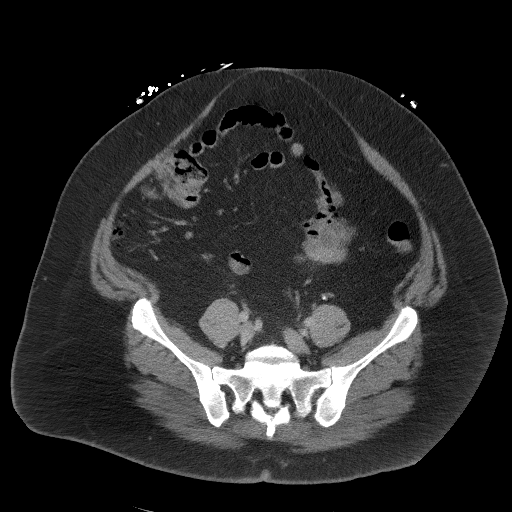
[im 46/91  soft-tissue]
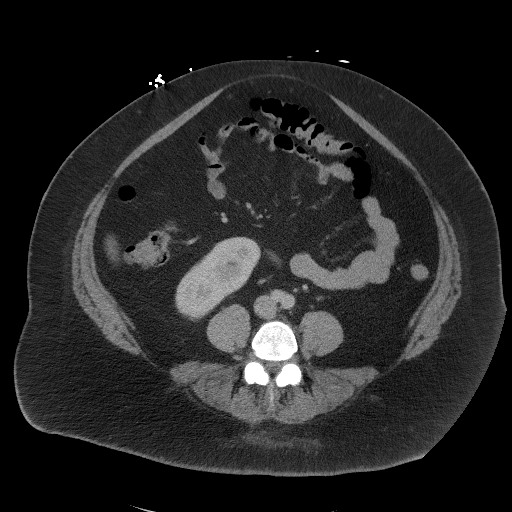
[im 54/91  soft-tissue]
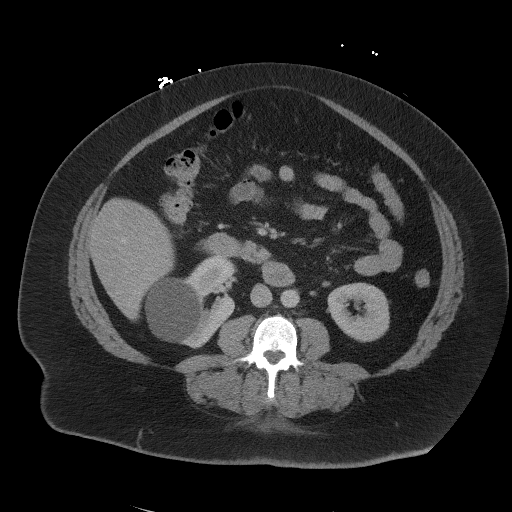
[im 58/91  soft-tissue]
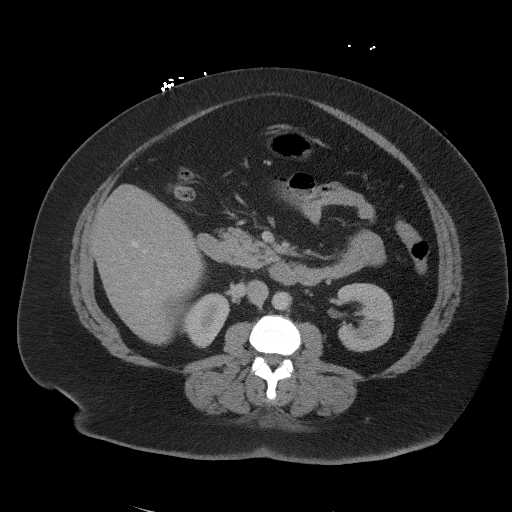
[im 58/91  bone]
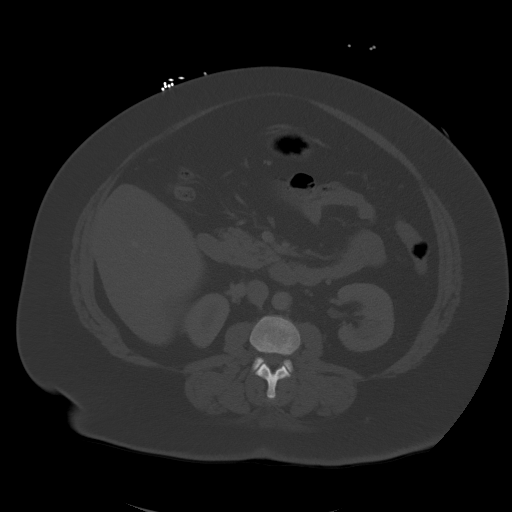
[im 66/91  soft-tissue]
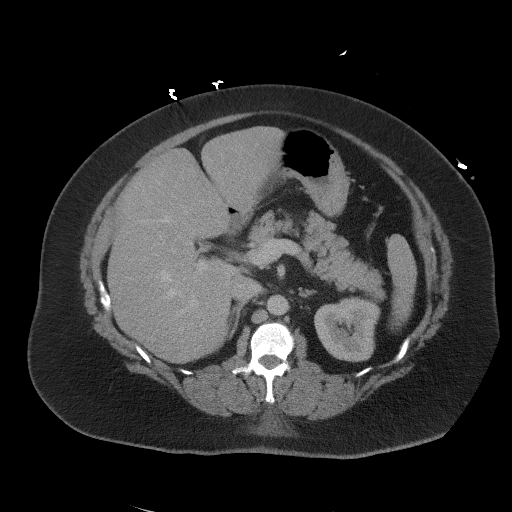
[im 70/91  soft-tissue]
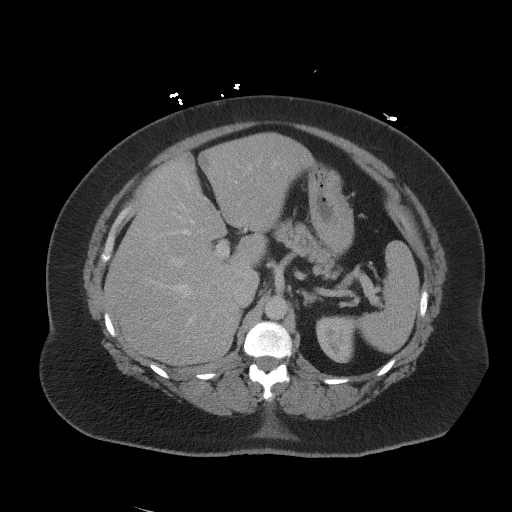
[im 78/91  soft-tissue]
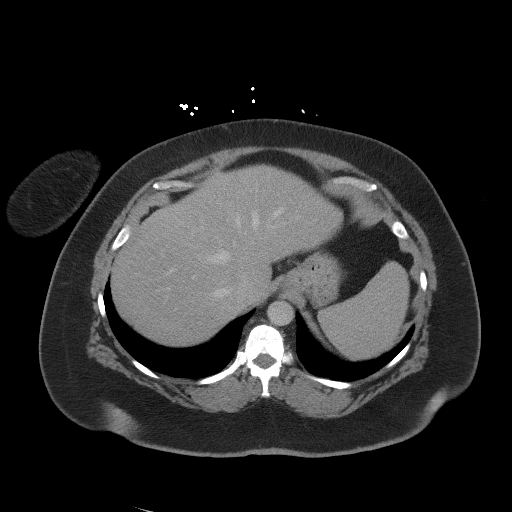
[im 86/91  soft-tissue]
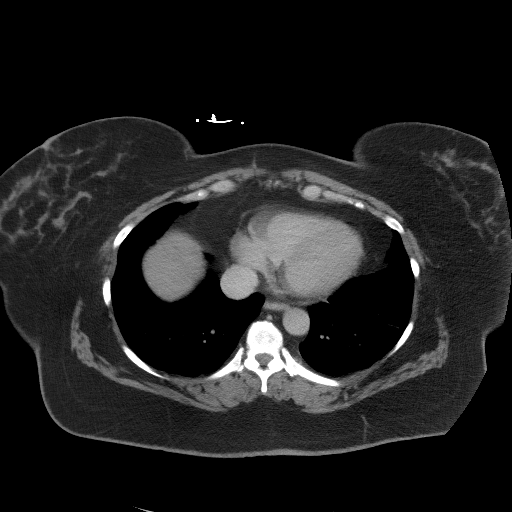

[Series 5: coronal st · coronal · 0.79mm/px · 3 of 127 slices shown]
[im 43/127  soft-tissue]
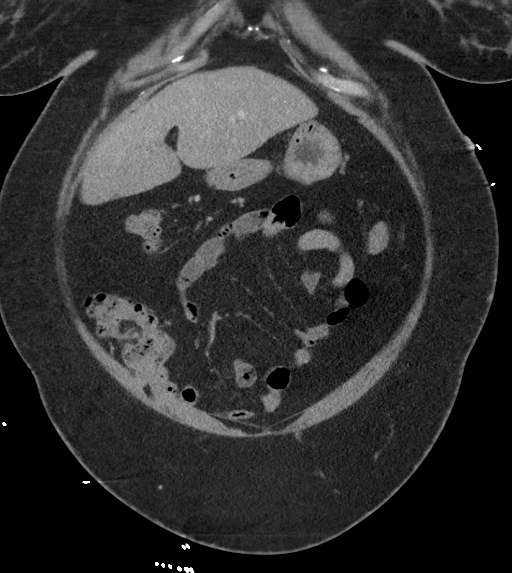
[im 57/127  soft-tissue]
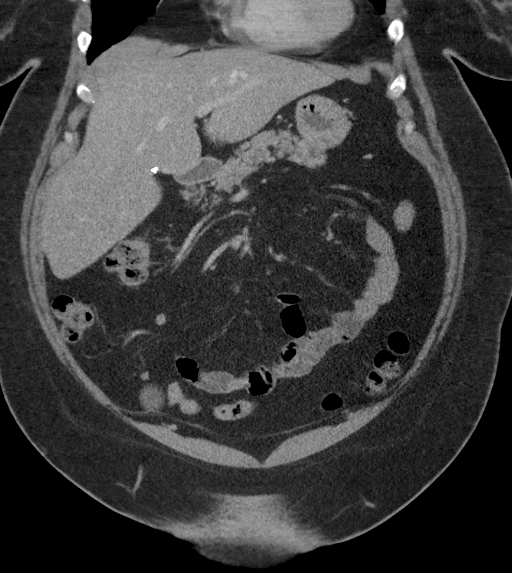
[im 71/127  soft-tissue]
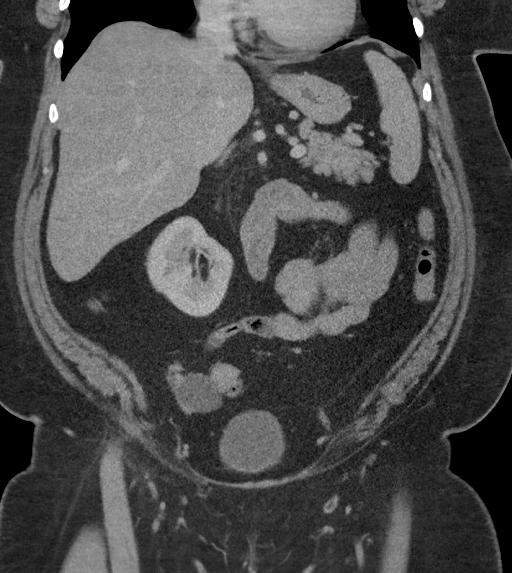

[16 of 46 positions shown; findings below may reference images not displayed]

FINDINGS: Lower chest: Lung bases demonstrate no acute consolidation or
effusion. Heart size within normal limits.

Hepatobiliary: Hepatic steatosis. Focal nodular contour versus small
exophytic lesion off the posterior right hepatic lobe shows no
change and is likely benign. Status post cholecystectomy. No biliary
dilatation

Pancreas: Unremarkable. No pancreatic ductal dilatation or
surrounding inflammatory changes.

Spleen: Normal in size without focal abnormality.

Adrenals/Urinary Tract: Adrenal glands are normal. Cyst in the mid
right kidney. No hydronephrosis. The bladder is normal

Stomach/Bowel: Stomach is within normal limits. Appendix appears
normal. No evidence of bowel wall thickening, distention, or
inflammatory changes.

Vascular/Lymphatic: No significant vascular findings are present. No
enlarged abdominal or pelvic lymph nodes.

Reproductive: Status post hysterectomy. Stable appearance of left
adnexa. Simple appearing cysts in the right ovary measuring up to
4.2 cm, no further imaging is specifically recommended.

Other: Negative for free air or free fluid. Anterior abdominal wall
laxity without frank hernia.

Musculoskeletal: No acute or significant osseous findings.
IMPRESSION: 1. No CT evidence for acute intra-abdominal or pelvic abnormality.
2. Hepatic steatosis.

## 2020-05-04 MED ORDER — MAGNESIUM SULFATE 2 GM/50ML IV SOLN
2.0000 g | Freq: Once | INTRAVENOUS | Status: AC
  Administered 2020-05-04: 2 g via INTRAVENOUS
  Filled 2020-05-04: qty 50

## 2020-05-04 MED ORDER — IOHEXOL 300 MG/ML  SOLN
100.0000 mL | Freq: Once | INTRAMUSCULAR | Status: AC | PRN
  Administered 2020-05-04: 100 mL via INTRAVENOUS

## 2020-05-04 MED ORDER — POTASSIUM CHLORIDE CRYS ER 20 MEQ PO TBCR
40.0000 meq | EXTENDED_RELEASE_TABLET | Freq: Once | ORAL | Status: AC
  Administered 2020-05-04: 40 meq via ORAL
  Filled 2020-05-04: qty 2

## 2020-05-04 NOTE — ED Provider Notes (Signed)
[facility] EMERGENCY DEPARTMENT Provider Note   CSN: [id number] Arrival date & time: 05/03/20  2013     History Chief Complaint  Patient presents with  . Abdominal Pain    Heather Fry is a 44 y.o. female.  Patient with history of GERD, Graves' disease, anxiety, obesity presenting with "being sick for the past 1 month".  States she has had progressive upper abdominal pain that is constant for the past 1 month it is worse with eating.  Is associated with nausea but no vomiting.  She had frequent loose bowel movements every time she tries to eat and states that it is yellow stool that comes immediately out of her multiple times a day.  She saw her gastroenterologist and is being scheduled for an EGD but this cannot be done until November.  She continues to take Prilosec as well as Carafate.  She is concerned she could be hypercalcemic and recently saw her endocrinologist for elevated PTH and vitamin B12 level.  She states she came in tonight because she was feeling dehydrated and feels like her blood pressure has been labile and up and down at home.  She denies any chest pain or shortness of breath currently.  No focal weakness, numbness or tingling.  She has had intermittent pain with swallowing and her PCP is scheduled a "scan of my throat" in the near future.  She states she feels like she has nothing left inside of her and is not able to tolerate anything at home due to persistent nausea and diarrhea.  The history is provided by the patient.  Abdominal Pain Associated symptoms: diarrhea, fatigue and nausea   Associated symptoms: no chest pain, no cough, no dysuria, no fever, no hematuria, no shortness of breath and no vomiting        Past Medical History:  Diagnosis Date  . Anxiety   . Arthritis   . Depression   . Diverticulosis   . GERD (gastroesophageal reflux disease)   . Graves disease   . Internal hemorrhoids   . Thyroid disease   . Uterine fibroid     Patient Active  Problem List   Diagnosis Date Noted  . Sensory disturbance 01/20/2020  . Atypical chest pain 01/20/2020  . Obesity, Class III, BMI 40-49.9 (morbid obesity) ([place]) 01/20/2020  . Weakness 01/19/2020  . Hyperglycemia 10/04/2019  . Myalgia 09/05/2019  . Vitamin B 12 deficiency 07/28/2019  . Right sided numbness 06/29/2019  . Constipation 06/15/2019  . Elevated lipase 06/15/2019  . Hypomagnesemia 03/29/2019  . Leukocytosis 03/29/2019  . Abdominal pain 02/10/2019  . Nausea without vomiting 02/10/2019  . Tingling of face 11/30/2018  . Flushing 11/30/2018  . Vitamin D deficiency 10/06/2018  . GERD (gastroesophageal reflux disease) 01/28/2017  . Rectal bleeding 01/28/2017  . 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 Hypothyroidism 04/14/2016  . Gastric polyp   . RUQ abdominal pain 09/06/2014  . Excessive or frequent menstruation 01/13/2013  . Depression     Past Surgical History:  Procedure Laterality Date  . ABDOMINAL HYSTERECTOMY     Per patient, uterine fibroids.  . ABDOMINAL HYSTERECTOMY    . CHOLECYSTECTOMY N/A 11/06/2014   Procedure: LAPAROSCOPIC CHOLECYSTECTOMY;  Surgeon: Jamesetta So, MD;  Location: [facility] ORS;  Service: General;  Laterality: N/A;  . COLONOSCOPY N/A 03/04/2017   Dr. Gala Romney: Hemorrhoids, diverticulosis, benign polyp without adenomatous changes.  Next colonoscopy 10 years  . DILATION AND CURETTAGE OF UTERUS    . ESOPHAGOGASTRODUODENOSCOPY N/A 10/02/2014   Dr.  Rourk: fundic gland polyps, hiatal hernia  . MOUTH SURGERY     extraction of teeth  . POLYPECTOMY  03/04/2017   Procedure: POLYPECTOMY;  Surgeon: Daneil Dolin, MD;  Location: [facility] ENDO SUITE;  Service: Endoscopy;;  colon  . THYROIDECTOMY N/A 09/30/2018   Procedure: TOTAL THYROIDECTOMY;  Surgeon: Armandina Gemma, MD;  Location: [facility] ORS;  Service: General;  Laterality: N/A;  . TUBAL LIGATION       OB History    Gravida  2   Para  2   Term  2   Preterm      AB      Living  2     SAB      TAB      Ectopic       Multiple      Live Births              Family History  Problem Relation Age of Onset  . Hypertension Father   . Diabetes Father   . Depression Other   . Uterine cancer Mother        ?cervical cancer  . Uterine cancer Sister        suicide  . Colon cancer Paternal Aunt 42  . Thyroid disease Neg Hx     Social History   Tobacco Use  . Smoking status: Former Smoker    Packs/day: 1.00    Years: 5.00    Pack years: 5.00    Types: Cigarettes    Quit date: 11/03/1997    Years since quitting: 22.5  . Smokeless tobacco: Never Used  . Tobacco comment: 1 pack 1 week  Vaping Use  . Vaping Use: Never used  Substance Use Topics  . Alcohol use: No    Alcohol/week: 0.0 standard drinks  . Drug use: No    Home Medications Prior to Admission medications   Medication Sig Start Date End Date Taking? Authorizing Provider  acetaminophen (TYLENOL) 500 MG tablet Take 1,000 mg by mouth daily as needed for moderate pain.    [provider]  albuterol (VENTOLIN HFA) 108 (90 Base) MCG/ACT inhaler Inhale 2 puffs into the lungs every 6 (six) hours as needed. 12/13/19   Julian Hy, DO  budesonide-formoterol (SYMBICORT) 160-4.5 MCG/ACT inhaler Inhale 2 puffs into the lungs in the morning and at bedtime. 12/13/19   Julian Hy, DO  cetirizine (ZYRTEC ALLERGY) 10 MG tablet Take 1 tablet (10 mg total) by mouth daily. Patient not taking: Reported on 04/20/2020 12/13/19   Noemi Chapel P, DO  Cholecalciferol (VITAMIN D3) 125 MCG (5000 UT) CAPS Take 1 capsule (5,000 Units total) by mouth daily. Patient taking differently: Take 10,000 Units by mouth at bedtime.  10/13/19   Renato Shin, MD  dicyclomine (BENTYL) 20 MG tablet Take 1 pill every 12 hours for abdominal cramping 04/22/20   Milton Ferguson, MD  docusate sodium (COLACE) 100 MG capsule Take 100 mg by mouth at bedtime.    [provider]  escitalopram (LEXAPRO) 20 MG tablet Take 1 tablet (20 mg total) by mouth at bedtime. 01/26/19    Renato Shin, MD  fluticasone (FLONASE) 50 MCG/ACT nasal spray Place 2 sprays into both nostrils daily. Patient taking differently: Place 2 sprays into both nostrils at bedtime.  12/13/19   Julian Hy, DO  gabapentin (NEURONTIN) 300 MG capsule Take 1 capsule (300 mg total) by mouth 2 (two) times daily. Patient not taking: Reported on 01/30/2020 01/20/20   Orson Eva, MD  Magnesium 250 MG TABS Take 250 mg by mouth at bedtime.     [provider]  methylPREDNISolone (MEDROL) 4 MG tablet Take 4 mg by mouth in the morning and at bedtime.    [provider]  montelukast (SINGULAIR) 10 MG tablet Take 1 tablet (10 mg total) by mouth at bedtime. 12/13/19   Julian Hy, DO  NON FORMULARY at bedtime. CPAP at night     [provider]  omeprazole (PRILOSEC) 20 MG capsule Take 20 mg by mouth 2 (two) times daily before a meal.    [provider]  ondansetron (ZOFRAN ODT) 4 MG disintegrating tablet 4mg  ODT q4 hours prn nausea/vomit 04/22/20   Milton Ferguson, MD  prednisoLONE acetate (PRED FORTE) 1 % ophthalmic suspension Place 1 drop into both eyes daily. 12/30/19   [provider]  RESTASIS 0.05 % ophthalmic emulsion Place 1 drop into both eyes 2 (two) times daily. 12/26/19   [provider]  simethicone (PHAZYME) 125 MG chewable tablet Chew 125-250 mg by mouth every 6 (six) hours as needed for flatulence.     [provider]  sucralfate (CARAFATE) 1 g tablet Take 1 g by mouth 4 (four) times daily. 01/18/20   [provider]  SYNTHROID 150 MCG tablet Take 1 tablet (150 mcg total) by mouth daily before breakfast. 10/04/19   Renato Shin, MD  Tiotropium Bromide Monohydrate (SPIRIVA RESPIMAT) 1.25 MCG/ACT AERS Inhale 2 puffs into the lungs daily. 01/05/20   Julian Hy, DO  vitamin B-12 (CYANOCOBALAMIN) 1000 MCG tablet Take 1,000 mcg by mouth at bedtime.    [provider]  Wheat Dextrin (BENEFIBER DRINK MIX PO) Take 1 Dose by mouth 3  (three) times daily.     [provider]    Allergies    Levofloxacin, Toradol [ketorolac tromethamine], and Penicillins  Review of Systems   Review of Systems  Constitutional: Positive for activity change, appetite change and fatigue. Negative for fever.  HENT: Negative for congestion and rhinorrhea.   Eyes: Negative for visual disturbance.  Respiratory: Negative for cough, chest tightness and shortness of breath.   Cardiovascular: Negative for chest pain.  Gastrointestinal: Positive for abdominal pain, diarrhea and nausea. Negative for vomiting.  Genitourinary: Negative for dysuria and hematuria.  Musculoskeletal: Negative for arthralgias and myalgias.  Skin: Negative for rash.  Neurological: Positive for weakness. Negative for dizziness and headaches.   all other systems are negative except as noted in the HPI and PMH.    Physical Exam Updated Vital Signs BP (!) 150/96   Pulse 86   Temp 98.9 F (37.2 C) (Oral)   Resp 20   Ht 5\' 4"  (1.626 m)   Wt 120.7 kg   LMP 10/09/2014   SpO2 98%   BMI 45.66 kg/m   Physical Exam Vitals and nursing note reviewed.  Constitutional:      General: She is not in acute distress.    Appearance: She is well-developed.  HENT:     Head: Normocephalic and atraumatic.     Mouth/Throat:     Mouth: Mucous membranes are dry.     Pharynx: No oropharyngeal exudate.  Eyes:     Conjunctiva/sclera: Conjunctivae normal.     Pupils: Pupils are equal, round, and reactive to light.  Neck:     Comments: No meningismus. Cardiovascular:     Rate and Rhythm: Normal rate and regular rhythm.     Heart sounds: Normal heart sounds. No murmur heard.   Pulmonary:  Effort: Pulmonary effort is normal. No respiratory distress.     Breath sounds: Normal breath sounds.  Abdominal:     Palpations: Abdomen is soft.     Tenderness: There is abdominal tenderness. There is no guarding or rebound.     Comments: Soft abdomen, diffusely tender across the  upper quadrants. No lower abdominal tenderness, no guarding or rebound  Musculoskeletal:        General: No tenderness. Normal range of motion.     Cervical back: Normal range of motion and neck supple.     Comments: No CVA tenderness  Skin:    General: Skin is warm.  Neurological:     Mental Status: She is alert and oriented to person, place, and time.     Cranial Nerves: No cranial nerve deficit.     Motor: No abnormal muscle tone.     Coordination: Coordination normal.     Comments: No ataxia on finger to nose bilaterally. No pronator drift. 5/5 strength throughout. CN 2-12 intact.Equal grip strength. Sensation intact.   Psychiatric:        Behavior: Behavior normal.     ED Results / Procedures / Treatments   Labs (all labs ordered are listed, but only abnormal results are displayed) Labs Reviewed  URINALYSIS, ROUTINE W REFLEX MICROSCOPIC - Abnormal; Notable for the following components:      Result Value   Color, Urine STRAW (*)    Ketones, ur 5 (*)    All other components within normal limits  CBC WITH DIFFERENTIAL/PLATELET - Abnormal; Notable for the following components:   WBC 10.7 (*)    Neutro Abs 8.1 (*)    All other components within normal limits  COMPREHENSIVE METABOLIC PANEL - Abnormal; Notable for the following components:   Sodium 134 (*)    Potassium 3.2 (*)    CO2 21 (*)    All other components within normal limits  MAGNESIUM - Abnormal; Notable for the following components:   Magnesium 1.6 (*)    All other components within normal limits  LIPASE, BLOOD  HCG, SERUM, QUALITATIVE  TROPONIN I (HIGH SENSITIVITY)  TROPONIN I (HIGH SENSITIVITY)    EKG EKG Interpretation  Date/Time:  Friday May 04 2020 00:02:58 EDT Ventricular Rate:  84 PR Interval:    QRS Duration: 79 QT Interval:  394 QTC Calculation: 466 R Axis:   62 Text Interpretation: Sinus rhythm Low voltage, precordial leads No significant change was found Confirmed by Ezequiel Essex  [phone]) on 05/04/2020 12:06:19 AM   Radiology CT ABDOMEN PELVIS W CONTRAST  Result Date: 05/04/2020 CLINICAL DATA:  Epigastric pain EXAM: CT ABDOMEN AND PELVIS WITH CONTRAST TECHNIQUE: Multidetector CT imaging of the abdomen and pelvis was performed using the standard protocol following bolus administration of intravenous contrast. CONTRAST:  161mL OMNIPAQUE IOHEXOL 300 MG/ML  SOLN COMPARISON:  Radiograph 04/22/2020, CT 04/07/2019, 05/04/2017, 02/07/2018 FINDINGS: Lower chest: Lung bases demonstrate no acute consolidation or effusion. Heart size within normal limits. Hepatobiliary: Hepatic steatosis. Focal nodular contour versus small exophytic lesion off the posterior right hepatic lobe shows no change and is likely benign. Status post cholecystectomy. No biliary dilatation Pancreas: Unremarkable. No pancreatic ductal dilatation or surrounding inflammatory changes. Spleen: Normal in size without focal abnormality. Adrenals/Urinary Tract: Adrenal glands are normal. Cyst in the mid right kidney. No hydronephrosis. The bladder is normal Stomach/Bowel: Stomach is within normal limits. Appendix appears normal. No evidence of bowel wall thickening, distention, or inflammatory changes. Vascular/Lymphatic: No significant vascular findings are present. No  enlarged abdominal or pelvic lymph nodes. Reproductive: Status post hysterectomy. Stable appearance of left adnexa. Simple appearing cysts in the right ovary measuring up to 4.2 cm, no further imaging is specifically recommended. Other: Negative for free air or free fluid. Anterior abdominal wall laxity without frank hernia. Musculoskeletal: No acute or significant osseous findings. IMPRESSION: 1. No CT evidence for acute intra-abdominal or pelvic abnormality. 2. Hepatic steatosis. Electronically Signed   By: Donavan Foil M.D.   On: 05/04/2020 01:17    Procedures Procedures (including critical care time)  Medications Ordered in ED Medications  sodium chloride  0.9 % bolus 1,000 mL (0 mLs Intravenous Stopped 05/04/20 0159)  ondansetron (ZOFRAN) injection 4 mg (4 mg Intravenous Given 05/04/20 0022)  pantoprazole (PROTONIX) injection 40 mg (40 mg Intravenous Given 05/04/20 0022)  magnesium sulfate IVPB 2 g 50 mL (0 g Intravenous Stopped 05/04/20 0241)  iohexol (OMNIPAQUE) 300 MG/ML solution 100 mL (100 mLs Intravenous Contrast Given 05/04/20 0053)  potassium chloride SA (KLOR-CON) CR tablet 40 mEq (40 mEq Oral Given 05/04/20 0246)    ED Course  I have reviewed the triage vital signs and the nursing notes.  Pertinent labs & imaging results that were available during my care of the patient were reviewed by me and considered in my medical decision making (see chart for details).    MDM Rules/Calculators/A&P                         1 month of upper abdominal pain with nausea, diarrhea, fatigue.  Vitals are stable.  Abdomen soft without peritoneal signs.  Recent GI visit with patient plan to EGD in the near future.  She is on a PPI as well as Carafate.  Her abdominal soft without peritoneal signs.  Labs are reassuring with normal LFTs, lipase and white blood cell count.  Urinalysis is negative.  Mild hypokalemia and hypomagnesemia repleted.  EKG is sinus rhythm without prolonged QT.  CT shows no acute pathology or surgical process. She is tolerating p.o. and feels like her stomach feels better. Patient feels improved after IV fluids as well as Protonix and antiemetics.  Encouraged close follow-up with her GI doctor and trying to move up her EGD appointment.  Avoid alcohol, caffeine, NSAIDs, spicy foods.  Continue PPI and Carafate. Troponin negative x2.  Low suspicion for ACS. Abdomen soft without peritoneal signs.  Patient appears stable for outpatient follow-up.  Return precautions discussed  Final Clinical Impression(s) / ED Diagnoses Final diagnoses:  Generalized abdominal pain    Rx / DC Orders ED Discharge Orders    None       Shardae Kleinman, Annie Main,  MD 05/04/20 [id number]

## 2020-05-04 NOTE — ED Notes (Signed)
Patient transported to CT 

## 2020-05-04 NOTE — Discharge Instructions (Signed)
Continue your stomach medications as prescribed.  Follow-up with your gastroenterologist for your EGD.  See if this can be moved up.  Return to the ED with difficulty swallowing, difficulty breathing, intractable pain, nausea, vomiting, other concerns.

## 2020-05-05 DIAGNOSIS — J45909 Unspecified asthma, uncomplicated: Secondary | ICD-10-CM | POA: Diagnosis not present

## 2020-05-05 DIAGNOSIS — E079 Disorder of thyroid, unspecified: Secondary | ICD-10-CM | POA: Diagnosis not present

## 2020-05-05 DIAGNOSIS — R197 Diarrhea, unspecified: Secondary | ICD-10-CM | POA: Diagnosis not present

## 2020-05-05 DIAGNOSIS — F329 Major depressive disorder, single episode, unspecified: Secondary | ICD-10-CM | POA: Diagnosis not present

## 2020-05-05 DIAGNOSIS — R531 Weakness: Secondary | ICD-10-CM | POA: Diagnosis not present

## 2020-05-05 DIAGNOSIS — Z880 Allergy status to penicillin: Secondary | ICD-10-CM | POA: Diagnosis not present

## 2020-05-05 DIAGNOSIS — E860 Dehydration: Secondary | ICD-10-CM | POA: Diagnosis not present

## 2020-05-05 DIAGNOSIS — K589 Irritable bowel syndrome without diarrhea: Secondary | ICD-10-CM | POA: Diagnosis not present

## 2020-05-05 DIAGNOSIS — Z888 Allergy status to other drugs, medicaments and biological substances status: Secondary | ICD-10-CM | POA: Diagnosis not present

## 2020-05-05 DIAGNOSIS — Z79899 Other long term (current) drug therapy: Secondary | ICD-10-CM | POA: Diagnosis not present

## 2020-05-05 DIAGNOSIS — K219 Gastro-esophageal reflux disease without esophagitis: Secondary | ICD-10-CM | POA: Diagnosis not present

## 2020-05-08 ENCOUNTER — Ambulatory Visit (INDEPENDENT_AMBULATORY_CARE_PROVIDER_SITE_OTHER): Payer: BC Managed Care – PPO | Admitting: Gastroenterology

## 2020-05-08 ENCOUNTER — Telehealth: Payer: Self-pay

## 2020-05-08 ENCOUNTER — Encounter: Payer: Self-pay | Admitting: Gastroenterology

## 2020-05-08 ENCOUNTER — Other Ambulatory Visit: Payer: Self-pay

## 2020-05-08 ENCOUNTER — Telehealth: Payer: Self-pay | Admitting: Internal Medicine

## 2020-05-08 VITALS — BP 170/110 | HR 91 | Temp 97.1°F | Ht 64.0 in | Wt 261.6 lb

## 2020-05-08 DIAGNOSIS — R1010 Upper abdominal pain, unspecified: Secondary | ICD-10-CM

## 2020-05-08 DIAGNOSIS — R197 Diarrhea, unspecified: Secondary | ICD-10-CM | POA: Diagnosis not present

## 2020-05-08 DIAGNOSIS — R748 Abnormal levels of other serum enzymes: Secondary | ICD-10-CM | POA: Diagnosis not present

## 2020-05-08 MED ORDER — DICYCLOMINE HCL 10 MG PO CAPS
10.0000 mg | ORAL_CAPSULE | Freq: Three times a day (TID) | ORAL | 0 refills | Status: DC
Start: 2020-05-08 — End: 2020-09-13

## 2020-05-08 NOTE — Telephone Encounter (Signed)
Noted, FYI  routing to Neil Crouch, [place]

## 2020-05-08 NOTE — Telephone Encounter (Signed)
Pt was seen in office today. Second BP reading was 170/110. Per Neil Crouch, PA, pt is to recheck her BP when she gets home and settled. If pts BP is over 160/100, pt was advised to call her PCP. Called and spoke with pt. Pt voiced understanding and would recheck her PB when she gets settled.

## 2020-05-08 NOTE — Telephone Encounter (Signed)
Pt was seen earlier by Neil Crouch, PA and she called to let Magda Paganini know her BP was 144/86 and her pulse was 81. She said she always have panic attacks when she comes to our office.

## 2020-05-08 NOTE — Progress Notes (Signed)
Primary Care Physician: Redmond School, MD  Primary Gastroenterologist:  Garfield Cornea, MD   Chief Complaint  Patient presents with  . Diarrhea    x4 weeks; Lomotil caused worse abdominal pain  . Abdominal Pain    across upper abd and down both sides  . Nausea    HPI: Heather Fry is a 44 y.o. female here for follow-up.  Last seen September 2020 through telemedicine.  History of GERD, abdominal pain, IBS.  EGD 2016 with fundic gland polyps, hiatal hernia.  Colonoscopy June 2018 with hemorrhoids, diverticulosis, benign polyp without adenomatous changes.  10-year follow-up colonoscopy advised.  Reviewed records from [facility], [facility], [facility].  CT abdomen pelvis with contrast May 04, 2020: Hepatic steatosis.  Focal nodular contour versus small exophytic lesion off the posterior right hepatic lobe with no change, likely benign.  Labs May 06, 2020: Albumin 3.3 otherwise LFTs normal, renal function normal, TSH normal, lipase elevated at 186, CBC unremarkable.  Seen by [facility] through telemedicine April 27, 2020.  Plans for EGD and hydrogen breath test, labs for H. pylori antigen, celiac serologies.  Patient states she is scheduled for hydrogen breath test in September.  EGD not scheduled until November.  Patient presents to our practice in the interim because of recent acute changes.  ED provider recommended she follow-up with [place] to expedite her care as she is required multiple ED visits for diarrhea and dehydration.  She states she initially noted she was not feeling well when she saw her neurologist on July 20th and it was noted that her blood pressure was lower than usual with systolic in the 423N, heart rate of 101.  She was advised that she was likely dehydrated and told to go home and push fluids.  She was seen in the ED the following day with the same complaints.  At that point she had started having some diarrhea.  She was  treated for dehydration on July 21 in the ED.  Seen again at [facility] ED on July 25, August 6 and at [facility] on August 8 for the same symptoms.  Labs have indicated mild leukocytosis in the setting of chronic steroids.  Recent TSH normal.  A1c 6.23 months ago, 2 weeks ago was 5.9.  Last week her magnesium was low at 1.6, potassium 3.2, sodium 134.  Most recent labs outlined above on August 8.  Patient states her blood pressure has been intermittently elevated.  She is seen at Endo in [place] for work-up of parathyroid and adrenal issues.  She is considering second opinion at [facility].  Had thyroid removed January 2019 now Synthroid.  Chronically on Medrol 4 mg twice daily.  Possible adrenal insufficiency testing today based on her description.  She complains of 4 weeks of diarrhea.  Symptoms started after taking 2 rounds of antibiotics for upper respiratory issues.  Initially on doxycycline, then followed by Z-Pak.  Stools were bright yellow, liquidy with greasy family.  She passes stool every time she urinates.  Also postprandially with urgency.  Complains of postprandial upper abdominal pain and nausea.  Pain will radiate into the right upper quadrant and sometimes the left upper quadrant.  Last weekend she went for Covid testing because she was concerned about her significant fatigue and GI issues.  It was negative.  Complains of nocturnal diarrhea.  No blood in the stools or melena.  In the ED several times  due to electrolyte abnormalities and dehydration.  Complains of significant fatigue, can barely ambulate.  Stays in bed.  Barely eating because of postprandial abdominal pain with diarrhea.  Sips of Powerade throughout the day.  Couple of sprites per day and some water.  Avoiding milk.  PCP gave her Lomotil but after taking a dose she had worsening abdominal pain.  Did find that it helped the diarrhea.  Afraid to take Imodium because of concern for interaction with Lexapro.  Not  sure that she has taken Bentyl anytime recently.  Very anxious about her illness.  Also feels a lump in the throat, questions whether this is related to her parathyroid issues.  Eating makes it better.  Drinking makes it better.  Denies heartburn.  She denies history of diabetes.  States she has been checked.  She has had a couple of elevated A1c's, the last one on July 23 was 5.9.   Wt Readings from Last 3 Encounters:  05/08/20 261 lb 9.6 oz (118.7 kg)  05/03/20 266 lb (120.7 kg)  04/22/20 (!) 266 lb (120.7 kg)     Current Outpatient Medications  Medication Sig Dispense Refill  . acetaminophen (TYLENOL) 500 MG tablet Take 1,000 mg by mouth daily as needed for moderate pain.    Marland Kitchen albuterol (VENTOLIN HFA) 108 (90 Base) MCG/ACT inhaler Inhale 2 puffs into the lungs every 6 (six) hours as needed. 18 g 11  . budesonide-formoterol (SYMBICORT) 160-4.5 MCG/ACT inhaler Inhale 2 puffs into the lungs in the morning and at bedtime. 1 Inhaler 11  . Cholecalciferol (VITAMIN D3) 125 MCG (5000 UT) CAPS Take 1 capsule (5,000 Units total) by mouth daily. (Patient taking differently: Take 10,000 Units by mouth at bedtime. ) 30 capsule 11  . escitalopram (LEXAPRO) 20 MG tablet Take 1 tablet (20 mg total) by mouth at bedtime. 30 tablet 5  . Magnesium 250 MG TABS Take 250 mg by mouth at bedtime.     . methylPREDNISolone (MEDROL) 4 MG tablet Take 4 mg by mouth in the morning and at bedtime.    . montelukast (SINGULAIR) 10 MG tablet Take 1 tablet (10 mg total) by mouth at bedtime. 30 tablet 11  . NON FORMULARY at bedtime. CPAP at night     . omeprazole (PRILOSEC) 20 MG capsule Take 20 mg by mouth 2 (two) times daily before a meal.    . RESTASIS 0.05 % ophthalmic emulsion Place 1 drop into both eyes 2 (two) times daily.    . simethicone (PHAZYME) 125 MG chewable tablet Chew 125-250 mg by mouth every 6 (six) hours as needed for flatulence.     . sucralfate (CARAFATE) 1 g tablet Take 1 g by mouth daily.     Marland Kitchen  SYNTHROID 150 MCG tablet Take 1 tablet (150 mcg total) by mouth daily before breakfast. 90 tablet 3  . Tiotropium Bromide Monohydrate (SPIRIVA RESPIMAT) 1.25 MCG/ACT AERS Inhale 2 puffs into the lungs daily. 4 g 11  . vitamin B-12 (CYANOCOBALAMIN) 1000 MCG tablet Take 1,000 mcg by mouth at bedtime.    . Wheat Dextrin (BENEFIBER DRINK MIX PO) Take 1 Dose by mouth 3 (three) times daily.     . fluticasone (FLONASE) 50 MCG/ACT nasal spray Place 2 sprays into both nostrils daily. (Patient taking differently: Place 2 sprays into both nostrils at bedtime. ) 16 g 2  . gabapentin (NEURONTIN) 300 MG capsule Take 1 capsule (300 mg total) by mouth 2 (two) times daily. (Patient not taking: Reported on 01/30/2020)  60 capsule 1  . prednisoLONE acetate (PRED FORTE) 1 % ophthalmic suspension Place 1 drop into both eyes daily.     No current facility-administered medications for this visit.    Allergies as of 05/08/2020 - Review Complete 05/08/2020  Allergen Reaction Noted  . Levofloxacin Other (See Comments) 03/03/2017  . Toradol [ketorolac tromethamine] Anaphylaxis and Hives 06/08/2019  . Penicillins Other (See Comments) 04/06/2012   Past Medical History:  Diagnosis Date  . Anxiety   . Arthritis   . Depression   . Diverticulosis   . GERD (gastroesophageal reflux disease)   . Graves disease   . Internal hemorrhoids   . Thyroid disease   . Uterine fibroid    Past Surgical History:  Procedure Laterality Date  . ABDOMINAL HYSTERECTOMY     Per patient, uterine fibroids.  . ABDOMINAL HYSTERECTOMY    . CHOLECYSTECTOMY N/A 11/06/2014   Procedure: LAPAROSCOPIC CHOLECYSTECTOMY;  Surgeon: Jamesetta So, MD;  Location: [facility];  Service: General;  Laterality: N/A;  . COLONOSCOPY N/A 03/04/2017   Dr. Gala Romney: Hemorrhoids, diverticulosis, benign polyp without adenomatous changes.  Next colonoscopy 10 years  . DILATION AND CURETTAGE OF UTERUS    . ESOPHAGOGASTRODUODENOSCOPY N/A 10/02/2014   Dr. Gala Romney: fundic gland  polyps, hiatal hernia  . MOUTH SURGERY     extraction of teeth  . POLYPECTOMY  03/04/2017   Procedure: POLYPECTOMY;  Surgeon: Daneil Dolin, MD;  Location: [facility];  Service: Endoscopy;;  colon  . THYROIDECTOMY N/A 09/30/2018   Procedure: TOTAL THYROIDECTOMY;  Surgeon: Armandina Gemma, MD;  Location: [facility];  Service: General;  Laterality: N/A;  . TUBAL LIGATION     Family History  Problem Relation Age of Onset  . Hypertension Father   . Diabetes Father   . Depression Other   . Uterine cancer Mother        ?cervical cancer  . Uterine cancer Sister        suicide  . Colon cancer Paternal Aunt 64  . Thyroid disease Neg Hx    Social History   Tobacco Use  . Smoking status: Former Smoker    Packs/day: 1.00    Years: 5.00    Pack years: 5.00    Types: Cigarettes    Quit date: 11/03/1997    Years since quitting: 22.5  . Smokeless tobacco: Never Used  . Tobacco comment: 1 pack 1 week  Vaping Use  . Vaping Use: Never used  Substance Use Topics  . Alcohol use: No    Alcohol/week: 0.0 standard drinks  . Drug use: No    ROS:  General: Negative for   fever, chills, positive fatigue, positive weakness.  She reports a 9 pound weight loss. ENT: Negative for hoarseness, difficulty swallowing , nasal congestion. CV: Negative for chest pain, angina, palpitations, dyspnea on exertion, peripheral edema.  Respiratory: Negative for dyspnea at rest, dyspnea on exertion, cough, sputum, wheezing.  GI: See history of present illness. GU:  Negative for dysuria, hematuria, urinary incontinence, urinary frequency, nocturnal urination.  Endo: Negative for unusual weight change.    Physical Examination:   BP (!) 188/108   Pulse 91   Temp (!) 97.1 F (36.2 C) (Temporal)   Ht 5\' 4"  (1.626 m)   Wt 261 lb 9.6 oz (118.7 kg)   LMP 10/09/2014   BMI 44.90 kg/m   General: Well-nourished, well-developed in no acute distress.  Eyes: No icterus. Mouth:masked Lungs: Clear to auscultation  bilaterally.  Heart: Regular rate and  rhythm, no murmurs rubs or gallops.  Abdomen: Bowel sounds are normal, nontender, nondistended, no hepatosplenomegaly or masses, no abdominal bruits or hernia , no rebound or guarding.   Extremities: No lower extremity edema. No clubbing or deformities. Neuro: Alert and oriented x 4   Skin: Warm and dry, no jaundice.   Psych: Alert and cooperative, normal mood and affect.  Labs:  Lab Results  Component Value Date   CREATININE 0.57 05/03/2020   BUN 6 05/03/2020   NA 134 (L) 05/03/2020   K 3.2 (L) 05/03/2020   CL 100 05/03/2020   CO2 21 (L) 05/03/2020   Lab Results  Component Value Date   ALT 23 05/03/2020   AST 15 05/03/2020   ALKPHOS 47 05/03/2020   BILITOT 0.4 05/03/2020   Lab Results  Component Value Date   WBC 10.7 (H) 05/03/2020   HGB 12.7 05/03/2020   HCT 39.5 05/03/2020   MCV 92.1 05/03/2020   PLT 259 05/03/2020   Lab Results  Component Value Date   LIPASE 35 05/03/2020   Lab Results  Component Value Date   VITAMINB12 1,900 (H) 04/20/2020   Lab Results  Component Value Date   FOLATE 10.4 01/20/2020     Imaging Studies: CT ABDOMEN PELVIS W CONTRAST  Result Date: 05/04/2020 CLINICAL DATA:  Epigastric pain EXAM: CT ABDOMEN AND PELVIS WITH CONTRAST TECHNIQUE: Multidetector CT imaging of the abdomen and pelvis was performed using the standard protocol following bolus administration of intravenous contrast. CONTRAST:  169mL OMNIPAQUE IOHEXOL 300 MG/ML  SOLN COMPARISON:  Radiograph 04/22/2020, CT 04/07/2019, 05/04/2017, 02/07/2018 FINDINGS: Lower chest: Lung bases demonstrate no acute consolidation or effusion. Heart size within normal limits. Hepatobiliary: Hepatic steatosis. Focal nodular contour versus small exophytic lesion off the posterior right hepatic lobe shows no change and is likely benign. Status post cholecystectomy. No biliary dilatation Pancreas: Unremarkable. No pancreatic ductal dilatation or surrounding  inflammatory changes. Spleen: Normal in size without focal abnormality. Adrenals/Urinary Tract: Adrenal glands are normal. Cyst in the mid right kidney. No hydronephrosis. The bladder is normal Stomach/Bowel: Stomach is within normal limits. Appendix appears normal. No evidence of bowel wall thickening, distention, or inflammatory changes. Vascular/Lymphatic: No significant vascular findings are present. No enlarged abdominal or pelvic lymph nodes. Reproductive: Status post hysterectomy. Stable appearance of left adnexa. Simple appearing cysts in the right ovary measuring up to 4.2 cm, no further imaging is specifically recommended. Other: Negative for free air or free fluid. Anterior abdominal wall laxity without frank hernia. Musculoskeletal: No acute or significant osseous findings. IMPRESSION: 1. No CT evidence for acute intra-abdominal or pelvic abnormality. 2. Hepatic steatosis. Electronically Signed   By: Donavan Foil M.D.   On: 05/04/2020 01:17   DG ABD ACUTE 2+V W 1V CHEST  Result Date: 04/22/2020 CLINICAL DATA:  44 year old female with right-sided abdominal pain and diarrhea. EXAM: DG ABDOMEN ACUTE W/ 1V CHEST COMPARISON:  Chest radiograph dated 04/08/2020. FINDINGS: The lungs are clear. There is no pleural effusion pneumothorax. The cardiac silhouette is within limits. There is no bowel dilatation or evidence of obstruction. No free air or radiopaque calculi. Right upper quadrant cholecystectomy clips. The osseous structures and soft tissues are grossly unremarkable. IMPRESSION: Negative abdominal radiographs. No acute cardiopulmonary disease. Electronically Signed   By: Anner Crete M.D.   On: 04/22/2020 15:52    Impression/plan:  Pleasant 44 year old female with numerous medical problems as outlined above presenting with 4-week history of acute persisting diarrhea, abdominal pain, poor oral intake.  Recent antibiotic exposure  prior to onset of symptoms.  Would be concern for C. difficile  colitis.  Patient has had multiple ED visits with documented electrolyte abnormalities, dehydration in the setting of diarrhea.  We will update labs today.  Check C. difficile GDH, GI pathogen panel, celiac serologies (per [facility] GI plan).  Update lipase.  Previously lipase was slightly elevated with no abnormality of the pancreas on CT, may not be significant.  Encourage patient to increase fluid intake to stay hydrated.  Goal of light-colored urine.  Avoid dairy and high fiber foods at this time.  Further recommendations to follow pending labs.  Trial of dicyclomine 10 mg 30 minutes before meals and at bedtime.  For chronic right upper quadrant abdominal pain, she will continue to follow with [facility] for pending test.  They wanted to do H. pylori stool antigen, at this time I cannot obtain that lab for them as she has had recent antibiotics and currently on PPI.  She will continue with hydrogen breath test next month as scheduled.  She will keep her scheduled for EGD in November, based on current work-up may need to have that moved up.

## 2020-05-08 NOTE — Patient Instructions (Addendum)
1. Please go to the lab today for stool tests and lab work.  2. Try bentyl 10mg  30 minutes before meals and at bedtime to treat abdominal pain and lessen diarrhea.  3. I could not order H.pylori stool antigen at this time (given current diarrhea issues, recent antibiotics and current use of omeprazole). We will work on that after we address your diarrhea.  4. Keep hydrogen breath test scheduled in [facility] for next month.  5. Continue to drink enough fluid to keep your urine light in color. Avoid dairy, raw veggies right now due to diarrhea.

## 2020-05-09 LAB — COMPREHENSIVE METABOLIC PANEL
AG Ratio: 1.3 (calc) (ref 1.0–2.5)
ALT: 20 U/L (ref 6–29)
AST: 14 U/L (ref 10–30)
Albumin: 4.2 g/dL (ref 3.6–5.1)
Alkaline phosphatase (APISO): 50 U/L (ref 31–125)
BUN: 7 mg/dL (ref 7–25)
CO2: 28 mmol/L (ref 20–32)
Calcium: 9.3 mg/dL (ref 8.6–10.2)
Chloride: 102 mmol/L (ref 98–110)
Creat: 0.68 mg/dL (ref 0.50–1.10)
Globulin: 3.2 g/dL (calc) (ref 1.9–3.7)
Glucose, Bld: 144 mg/dL — ABNORMAL HIGH (ref 65–99)
Potassium: 3.9 mmol/L (ref 3.5–5.3)
Sodium: 139 mmol/L (ref 135–146)
Total Bilirubin: 0.2 mg/dL (ref 0.2–1.2)
Total Protein: 7.4 g/dL (ref 6.1–8.1)

## 2020-05-09 LAB — TISSUE TRANSGLUTAMINASE, IGA: (tTG) Ab, IgA: 1 U/mL

## 2020-05-09 LAB — IGA: Immunoglobulin A: 213 mg/dL (ref 47–310)

## 2020-05-09 LAB — LIPASE: Lipase: 39 U/L (ref 7–60)

## 2020-05-09 NOTE — Telephone Encounter (Signed)
Noted. Thanks.

## 2020-05-11 LAB — GASTROINTESTINAL PATHOGEN PANEL PCR
C. difficile Tox A/B, PCR: NOT DETECTED
Campylobacter, PCR: NOT DETECTED
Cryptosporidium, PCR: NOT DETECTED
E coli (ETEC) LT/ST PCR: NOT DETECTED
E coli (STEC) stx1/stx2, PCR: NOT DETECTED
E coli 0157, PCR: NOT DETECTED
Giardia lamblia, PCR: NOT DETECTED
Norovirus, PCR: NOT DETECTED
Rotavirus A, PCR: NOT DETECTED
Salmonella, PCR: DETECTED — AB
Shigella, PCR: NOT DETECTED

## 2020-05-11 LAB — C. DIFFICILE GDH AND TOXIN A/B
GDH ANTIGEN: NOT DETECTED
MICRO NUMBER:: 10810933
SPECIMEN QUALITY:: ADEQUATE
TOXIN A AND B: NOT DETECTED

## 2020-05-12 ENCOUNTER — Telehealth: Payer: Self-pay | Admitting: Internal Medicine

## 2020-05-12 NOTE — Telephone Encounter (Signed)
Pt called last night.  States she was checking on pending labs through my chart.  Salmonella found on GIP.  Stool studies done for NEW symptoms of diarrhea x 4 weeks (already given Doxy and zpack.) Pt states diarrhea has RESOLVED.  Stomach still with some rumbling.  Bentyl prn. I recommended no more abx at this time.  Will send this communication to Neil Crouch. Any further recommendations needed can be made next week.

## 2020-05-14 ENCOUNTER — Ambulatory Visit: Payer: BC Managed Care – PPO | Admitting: Endocrinology

## 2020-05-14 NOTE — Telephone Encounter (Signed)
Noted. Will await for further recommendations if needed.

## 2020-05-16 ENCOUNTER — Telehealth (INDEPENDENT_AMBULATORY_CARE_PROVIDER_SITE_OTHER): Payer: BC Managed Care – PPO | Admitting: Endocrinology

## 2020-05-16 ENCOUNTER — Other Ambulatory Visit: Payer: Self-pay

## 2020-05-16 DIAGNOSIS — E538 Deficiency of other specified B group vitamins: Secondary | ICD-10-CM | POA: Diagnosis not present

## 2020-05-16 DIAGNOSIS — E559 Vitamin D deficiency, unspecified: Secondary | ICD-10-CM

## 2020-05-16 NOTE — Patient Instructions (Signed)
Blood tests are requested for you today.  We'll let you know about the results.  Please come back for a follow-up appointment in 2 months.   

## 2020-05-16 NOTE — Progress Notes (Signed)
Subjective:    Patient ID: Heather Fry, female    DOB: November 10, 1975, 43 y.o.   MRN: [id number]  HPI telehealth visit today via video visit.  Alternatives to telehealth are presented to this patient, and the patient agrees to the telehealth visit. Pt is advised of the cost of the visit, and agrees to this, also.   Patient is at home, and I am at the office.   Persons attending the telehealth visit: the patient and I.  Pt returns for f/u of postsurgical hypothyroidism (she had thyroidectomy 1/20, for hyperthyroidism; she was rx'ed synthroid soon thereafter).   She also has mild postsurgical hypoparathyroidism (she takes vit-D, 5000 units/day).  No recent Ca++ supplement.  She also has hypomagnesemia (she takes 250 mg qd).   She also has hyperglycemia.   She reports ongoing sxs (these have been normal: serum catechols, cortisol, and FSH/LH; she has had TAH but not BSO).  She was seen in ER 10 days ago, for generalized weakness and diarrhea.  Salmonella was dx'ed.  Rheumatologist rxs medrol 4 mg qd.   She stopped B-12, due to high level.   Past Medical History:  Diagnosis Date  . Anxiety   . Arthritis   . Depression   . Diverticulosis   . GERD (gastroesophageal reflux disease)   . Graves disease   . Internal hemorrhoids   . Thyroid disease   . Uterine fibroid     Past Surgical History:  Procedure Laterality Date  . ABDOMINAL HYSTERECTOMY     Per patient, uterine fibroids.  . ABDOMINAL HYSTERECTOMY    . CHOLECYSTECTOMY N/A 11/06/2014   Procedure: LAPAROSCOPIC CHOLECYSTECTOMY;  Surgeon: Jamesetta So, MD;  Location: [facility];  Service: General;  Laterality: N/A;  . COLONOSCOPY N/A 03/04/2017   Dr. Gala Romney: Hemorrhoids, diverticulosis, benign polyp without adenomatous changes.  Next colonoscopy 10 years  . DILATION AND CURETTAGE OF UTERUS    . ESOPHAGOGASTRODUODENOSCOPY N/A 10/02/2014   Dr. Gala Romney: fundic gland polyps, hiatal hernia  . MOUTH SURGERY     extraction of teeth  .  POLYPECTOMY  03/04/2017   Procedure: POLYPECTOMY;  Surgeon: Daneil Dolin, MD;  Location: [facility];  Service: Endoscopy;;  colon  . THYROIDECTOMY N/A 09/30/2018   Procedure: TOTAL THYROIDECTOMY;  Surgeon: Armandina Gemma, MD;  Location: [facility];  Service: General;  Laterality: N/A;  . TUBAL LIGATION      Social History   Socioeconomic History  . Marital status: Married    Spouse name: Not on file  . Number of children: Not on file  . Years of education: Not on file  . Highest education level: Not on file  Occupational History  . Not on file  Tobacco Use  . Smoking status: Former Smoker    Packs/day: 1.00    Years: 5.00    Pack years: 5.00    Types: Cigarettes    Quit date: 11/03/1997    Years since quitting: 22.5  . Smokeless tobacco: Never Used  . Tobacco comment: 1 pack 1 week  Vaping Use  . Vaping Use: Never used  Substance and Sexual Activity  . Alcohol use: No    Alcohol/week: 0.0 standard drinks  . Drug use: No  . Sexual activity: Yes    Birth control/protection: Surgical  Other Topics Concern  . Not on file  Social History Narrative  . Not on file   Social Determinants of Health   Financial Resource Strain:   . Difficulty of Paying Living  Expenses: Not on file  Food Insecurity:   . Worried About Charity fundraiser in the Last Year: Not on file  . Ran Out of Food in the Last Year: Not on file  Transportation Needs:   . Lack of Transportation (Medical): Not on file  . Lack of Transportation (Non-Medical): Not on file  Physical Activity:   . Days of Exercise per Week: Not on file  . Minutes of Exercise per Session: Not on file  Stress:   . Feeling of Stress : Not on file  Social Connections:   . Frequency of Communication with Friends and Family: Not on file  . Frequency of Social Gatherings with Friends and Family: Not on file  . Attends Religious Services: Not on file  . Active Member of Clubs or Organizations: Not on file  . Attends Theatre manager Meetings: Not on file  . Marital Status: Not on file  Intimate Partner Violence:   . Fear of Current or Ex-Partner: Not on file  . Emotionally Abused: Not on file  . Physically Abused: Not on file  . Sexually Abused: Not on file    Current Outpatient Medications on File Prior to Visit  Medication Sig Dispense Refill  . acetaminophen (TYLENOL) 500 MG tablet Take 1,000 mg by mouth daily as needed for moderate pain.    Marland Kitchen albuterol (VENTOLIN HFA) 108 (90 Base) MCG/ACT inhaler Inhale 2 puffs into the lungs every 6 (six) hours as needed. 18 g 11  . budesonide-formoterol (SYMBICORT) 160-4.5 MCG/ACT inhaler Inhale 2 puffs into the lungs in the morning and at bedtime. 1 Inhaler 11  . Cholecalciferol (VITAMIN D3) 125 MCG (5000 UT) CAPS Take 1 capsule (5,000 Units total) by mouth daily. (Patient taking differently: Take 10,000 Units by mouth at bedtime. ) 30 capsule 11  . dicyclomine (BENTYL) 10 MG capsule Take 1 capsule (10 mg total) by mouth 4 (four) times daily -  before meals and at bedtime. As needed to prevent diarrhea and help with abdominal pain. 120 capsule 0  . escitalopram (LEXAPRO) 20 MG tablet Take 1 tablet (20 mg total) by mouth at bedtime. 30 tablet 5  . fluticasone (FLONASE) 50 MCG/ACT nasal spray Place 2 sprays into both nostrils daily. (Patient taking differently: Place 2 sprays into both nostrils at bedtime. ) 16 g 2  . gabapentin (NEURONTIN) 300 MG capsule Take 1 capsule (300 mg total) by mouth 2 (two) times daily. (Patient not taking: Reported on 01/30/2020) 60 capsule 1  . Magnesium 250 MG TABS Take 250 mg by mouth at bedtime.     . methylPREDNISolone (MEDROL) 4 MG tablet Take 4 mg by mouth in the morning and at bedtime.    . montelukast (SINGULAIR) 10 MG tablet Take 1 tablet (10 mg total) by mouth at bedtime. 30 tablet 11  . NON FORMULARY at bedtime. CPAP at [facility]     . omeprazole (PRILOSEC) 20 MG capsule Take 20 mg by mouth 2 (two) times daily before a meal.    .  prednisoLONE acetate (PRED FORTE) 1 % ophthalmic suspension Place 1 drop into both eyes daily.    . RESTASIS 0.05 % ophthalmic emulsion Place 1 drop into both eyes 2 (two) times daily.    . simethicone (PHAZYME) 125 MG chewable tablet Chew 125-250 mg by mouth every 6 (six) hours as needed for flatulence.     . sucralfate (CARAFATE) 1 g tablet Take 1 g by mouth daily.     Marland Kitchen SYNTHROID  150 MCG tablet Take 1 tablet (150 mcg total) by mouth daily before breakfast. 90 tablet 3  . Tiotropium Bromide Monohydrate (SPIRIVA RESPIMAT) 1.25 MCG/ACT AERS Inhale 2 puffs into the lungs daily. 4 g 11  . Wheat Dextrin (BENEFIBER DRINK MIX PO) Take 1 Dose by mouth 3 (three) times daily.      No current facility-administered medications on file prior to visit.    Allergies  Allergen Reactions  . Levofloxacin Other (See Comments)    Pt can not have due to taking Lexapro   . Toradol [Ketorolac Tromethamine] Anaphylaxis and Hives  . Penicillins Other (See Comments)    Loopy, childhood allergy      Family History  Problem Relation Age of Onset  . Hypertension Father   . Diabetes Father   . Depression Other   . Uterine cancer Mother        ?cervical cancer  . Uterine cancer Sister        suicide  . Colon cancer Paternal Aunt 71  . Thyroid disease Neg Hx     LMP 10/09/2014     Review of Systems     Objective:   Physical Exam  Lab Results  Component Value Date   PTH 31 05/18/2020   PTH Comment 05/18/2020   CALCIUM 8.6 (L) 05/18/2020   CAION 1.25 01/19/2020   PHOS 3.3 05/18/2020   Mg++=normal.      Assessment & Plan:  Hypoparathyroidism, improved.  Optimal Ca++ is borderline low for now, in order to encourage PTH regrowth.  I told pt that her Ca++ levels need to be significantly abnormal in order to cause symptoms--and they have not been recently.   Hypomagnesemia: well-replaced.  Please continue the same medication.    Patient Instructions  Blood tests are requested for you today.   We'll let you know about the results.   Please come back for a follow-up appointment in 2 months.

## 2020-05-16 NOTE — Telephone Encounter (Signed)
Please check on patient. Let her know that her labs showed normal potassium, no signs of dehydration, celiac serologies were normal. Stool negative for Cdiff and as she is aware positive for salmonella.   If diarrhea resolved, then no further work up needed for that. How is she doing, did upper abdominal pain improve and nausea improve?  She has had other issues being worked up at [facility] with pending hydrogen breath test scheduled and EGD for 07/2020 scheduled. I suggest she keep those appointments.

## 2020-05-16 NOTE — Telephone Encounter (Signed)
Lmom, waiting on a return call.  

## 2020-05-17 DIAGNOSIS — I10 Essential (primary) hypertension: Secondary | ICD-10-CM | POA: Diagnosis not present

## 2020-05-17 DIAGNOSIS — E063 Autoimmune thyroiditis: Secondary | ICD-10-CM | POA: Diagnosis not present

## 2020-05-17 NOTE — Telephone Encounter (Signed)
Spoke with pt. Pt was notified of results. Rt upper and lower abdominal pain that moves across her stomach that comes and goes. Diarrhea has settled. Bowels are still a light color. Pt restarted benefiber 1 table spoon three times daily. Pt states she feels it will just take a while to recover from the Salmonella. Pt would like to know who long she should except to recover and is there anything else she needs to do.

## 2020-05-18 DIAGNOSIS — E538 Deficiency of other specified B group vitamins: Secondary | ICD-10-CM | POA: Diagnosis not present

## 2020-05-18 DIAGNOSIS — E559 Vitamin D deficiency, unspecified: Secondary | ICD-10-CM | POA: Diagnosis not present

## 2020-05-18 NOTE — Telephone Encounter (Signed)
It really can take a couple of weeks to start feeling more normal. Each day she should feel better than the day before. If not, she should keep [place] posted.   Suggest adding a probiotic such has Align or philips colon health for the next 1 month.

## 2020-05-18 NOTE — Telephone Encounter (Signed)
Left a detailed message. Pt is aware of Neil Crouch, PA recommendations.

## 2020-05-19 LAB — VITAMIN D 25 HYDROXY (VIT D DEFICIENCY, FRACTURES): Vit D, 25-Hydroxy: 52.2 ng/mL (ref 30.0–100.0)

## 2020-05-19 LAB — PTH, INTACT AND CALCIUM
Calcium: 8.6 mg/dL — ABNORMAL LOW (ref 8.7–10.2)
PTH: 31 pg/mL (ref 15–65)

## 2020-05-19 LAB — PHOSPHORUS: Phosphorus: 3.3 mg/dL (ref 3.0–4.3)

## 2020-05-19 LAB — VITAMIN B12: Vitamin B-12: 1029 pg/mL (ref 232–1245)

## 2020-05-19 LAB — MAGNESIUM: Magnesium: 2.1 mg/dL (ref 1.6–2.3)

## 2020-05-24 DIAGNOSIS — R140 Abdominal distension (gaseous): Secondary | ICD-10-CM | POA: Diagnosis not present

## 2020-05-24 DIAGNOSIS — R1011 Right upper quadrant pain: Secondary | ICD-10-CM | POA: Diagnosis not present

## 2020-05-24 DIAGNOSIS — K219 Gastro-esophageal reflux disease without esophagitis: Secondary | ICD-10-CM | POA: Diagnosis not present

## 2020-05-30 DIAGNOSIS — S338XXA Sprain of other parts of lumbar spine and pelvis, initial encounter: Secondary | ICD-10-CM | POA: Diagnosis not present

## 2020-05-30 DIAGNOSIS — M546 Pain in thoracic spine: Secondary | ICD-10-CM | POA: Diagnosis not present

## 2020-05-30 DIAGNOSIS — S134XXA Sprain of ligaments of cervical spine, initial encounter: Secondary | ICD-10-CM | POA: Diagnosis not present

## 2020-05-30 NOTE — Progress Notes (Signed)
Cardiology Office Note:    Date:  05/31/2020   ID:  Heather Fry, DOB 1976-07-23, MRN [id number]  PCP:  Redmond School, MD  Cardiologist:  No primary care provider on file.  Electrophysiologist:  None   Referring MD: Redmond School, MD   Chief Complaint  Patient presents with  . Hypertension    History of Present Illness:    Heather Fry is a 44 y.o. female with a hx of Graves' disease status post thyroidectomy, hypertension, hyperlipidemia, OSA on CPAP who presents for follow-up. She was initially seen on the 08/18/19 forfatigue/dyspnea/chest pain. She reports that she had beenstarted on pravastatin 3 weeks prior. States that since that time she hadbeen having muscle aches, fatigue, weakness, chest pain and shortness of breath. She went to the ED in [place], work-up was unremarkable. States she has felt like she did when her thyroid levels were abnormal, but had thyroid levels checked and was unremarkable. States that she used to walk 3 miles per day, but now is short of breath with minimal exertion. Also reports chest pain on the right side of chest, under the breast. Pain occurs at rest, has not noted any relationship with exertion. This also has started since she began pravastatin. She has decreased her pravastatin dose from daily to once per week.  Quit smoking in September 2019. No known family history heart disease. Not on any blood pressure meds since started CPAP for her OSA.   Given her 10-year ASCVD risk score 1.7% in symptoms with starting statin, recommended that statin be discontinued. Plan was to reevaluate in 1 month, and if symptoms continue rosuvastatin, then plan further evaluation with echo and stress test. She reported that she felt improved with stopping statin initially but then had recurrence of symptoms.  She presented to the ED on 09/28/2019 with chest pain. Reported pressure in lower chest that lasted about 30 minutes and resolved.  High-sensitivity troponins negative x2.    Given her symptoms, Lexiscan Myoview was done on 10/13/2019, which showed normal perfusion, LVEF 63%.  TTE on 10/19/2019 showed normal LVEF, normal RV function, no significant valvular disease.  Since last clinic visit, she reports that she has been having elevated BP.  Brought her BP log with her has been up to 130s to 160s over 80s to 90s when she checks at home.  Also having issues with peripheral neuropathy.  She remains on methylprednisolone.  Reports her dyspnea has improved.  Continues to have intermittent chest pain.  She reports compliance with her CPAP every night.    Past Medical History:  Diagnosis Date  . Anxiety   . Arthritis   . Depression   . Diverticulosis   . GERD (gastroesophageal reflux disease)   . Graves disease   . Internal hemorrhoids   . Thyroid disease   . Uterine fibroid     Past Surgical History:  Procedure Laterality Date  . ABDOMINAL HYSTERECTOMY     Per patient, uterine fibroids.  . ABDOMINAL HYSTERECTOMY    . CHOLECYSTECTOMY N/A 11/06/2014   Procedure: LAPAROSCOPIC CHOLECYSTECTOMY;  Surgeon: Jamesetta So, MD;  Location: [facility]S;  Service: General;  Laterality: N/A;  . COLONOSCOPY N/A 03/04/2017   Dr. Gala Romney: Hemorrhoids, diverticulosis, benign polyp without adenomatous changes.  Next colonoscopy 10 years  . DILATION AND CURETTAGE OF UTERUS    . ESOPHAGOGASTRODUODENOSCOPY N/A 10/02/2014   Dr. Gala Romney: fundic gland polyps, hiatal hernia  . MOUTH SURGERY     extraction of teeth  . POLYPECTOMY  03/04/2017   Procedure: POLYPECTOMY;  Surgeon: Daneil Dolin, MD;  Location: [facility];  Service: Endoscopy;;  colon  . THYROIDECTOMY N/A 09/30/2018   Procedure: TOTAL THYROIDECTOMY;  Surgeon: Armandina Gemma, MD;  Location: [facility];  Service: General;  Laterality: N/A;  . TUBAL LIGATION      Current Medications: Current Meds  Medication Sig  . acetaminophen (TYLENOL) 500 MG tablet Take 1,000 mg by mouth daily as needed  for moderate pain.  Marland Kitchen albuterol (VENTOLIN HFA) 108 (90 Base) MCG/ACT inhaler Inhale 2 puffs into the lungs every 6 (six) hours as needed.  . budesonide-formoterol (SYMBICORT) 160-4.5 MCG/ACT inhaler Inhale 2 puffs into the lungs in the morning and at bedtime.  . Cholecalciferol (VITAMIN D3) 125 MCG (5000 UT) CAPS Take 1 capsule (5,000 Units total) by mouth daily.  Marland Kitchen dicyclomine (BENTYL) 10 MG capsule Take 1 capsule (10 mg total) by mouth 4 (four) times daily -  before meals and at bedtime. As needed to prevent diarrhea and help with abdominal pain.  Marland Kitchen escitalopram (LEXAPRO) 20 MG tablet Take 1 tablet (20 mg total) by mouth at bedtime.  . Magnesium 250 MG TABS Take 250 mg by mouth at bedtime.   . methylPREDNISolone (MEDROL) 4 MG tablet Take 4 mg by mouth in the morning and at bedtime.  . montelukast (SINGULAIR) 10 MG tablet Take 1 tablet (10 mg total) by mouth at bedtime.  . NON FORMULARY at bedtime. CPAP at night   . omeprazole (PRILOSEC) 20 MG capsule Take 20 mg by mouth 2 (two) times daily before a meal.  . RESTASIS 0.05 % ophthalmic emulsion Place 1 drop into both eyes 2 (two) times daily.  . simethicone (PHAZYME) 125 MG chewable tablet Chew 125-250 mg by mouth every 6 (six) hours as needed for flatulence.   . sucralfate (CARAFATE) 1 g tablet Take 1 g by mouth daily.   Marland Kitchen SYNTHROID 150 MCG tablet Take 1 tablet (150 mcg total) by mouth daily before breakfast.  . Tiotropium Bromide Monohydrate (SPIRIVA RESPIMAT) 1.25 MCG/ACT AERS Inhale 2 puffs into the lungs daily.  . Wheat Dextrin (BENEFIBER DRINK MIX PO) Take 1 Dose by mouth 3 (three) times daily.      Allergies:   Levofloxacin, Toradol [ketorolac tromethamine], and Penicillins   Social History   Socioeconomic History  . Marital status: Married    Spouse name: Not on file  . Number of children: Not on file  . Years of education: Not on file  . Highest education level: Not on file  Occupational History  . Not on file  Tobacco Use  .  Smoking status: Former Smoker    Packs/day: 1.00    Years: 5.00    Pack years: 5.00    Types: Cigarettes    Quit date: 11/03/1997    Years since quitting: 22.5  . Smokeless tobacco: Never Used  . Tobacco comment: 1 pack 1 week  Vaping Use  . Vaping Use: Never used  Substance and Sexual Activity  . Alcohol use: No    Alcohol/week: 0.0 standard drinks  . Drug use: No  . Sexual activity: Yes    Birth control/protection: Surgical  Other Topics Concern  . Not on file  Social History Narrative  . Not on file   Social Determinants of Health   Financial Resource Strain:   . Difficulty of Paying Living Expenses: Not on file  Food Insecurity:   . Worried About Charity fundraiser in the Last Year: Not  on file  . Ran Out of Food in the Last Year: Not on file  Transportation Needs:   . Lack of Transportation (Medical): Not on file  . Lack of Transportation (Non-Medical): Not on file  Physical Activity:   . Days of Exercise per Week: Not on file  . Minutes of Exercise per Session: Not on file  Stress:   . Feeling of Stress : Not on file  Social Connections:   . Frequency of Communication with Friends and Family: Not on file  . Frequency of Social Gatherings with Friends and Family: Not on file  . Attends Religious Services: Not on file  . Active Member of Clubs or Organizations: Not on file  . Attends Archivist Meetings: Not on file  . Marital Status: Not on file     Family History: The patient's family history includes Colon cancer (age of onset: 73) in her paternal aunt; Depression in an other family member; Diabetes in her father; Hypertension in her father; Uterine cancer in her mother and sister. There is no history of Thyroid disease.  ROS:   Please see the history of present illness.     All other systems reviewed and are negative.  EKGs/Labs/Other Studies Reviewed:    The following studies were reviewed today:   EKG:  EKG is not ordered today.  The ekg  ordered most recently demonstrates normal sinus rhythm, rate 88, no ST/T abnormalities   Lexiscan Myoview 10/13/2019:  The left ventricular ejection fraction is normal (55-65%).  Nuclear stress EF: 63%.  There was no ST segment deviation noted during stress.  No T wave inversion was noted during stress.  The study is normal.  This is a low risk study.   1. Normal myocardial perfusion imaging study without evidence of ischemia or infarction.  2. Normal LVEF, 63%. Normal wall motion.  3. Low-risk study.   TTE 10/19/19: 1. Left ventricular ejection fraction, by visual estimation, is 60 to  65%. The left ventricle has normal function. There is no left ventricular  hypertrophy.  2. The left ventricle has no regional wall motion abnormalities.  3. Global right ventricle has normal systolic function.The right  ventricular size is normal. No increase in right ventricular wall  thickness.  4. Left atrial size was mildly dilated.  5. Right atrial size was normal.  6. The mitral valve is normal in structure. Trivial mitral valve  regurgitation. No evidence of mitral stenosis.  7. The tricuspid valve is normal in structure.  8. The tricuspid valve is normal in structure. Tricuspid valve  regurgitation is not demonstrated.  9. The aortic valve was not well visualized. Aortic valve regurgitation  is not visualized. No evidence of aortic valve sclerosis or stenosis.  10. The pulmonic valve was normal in structure. Pulmonic valve  regurgitation is not visualized.  11. The inferior vena cava is normal in size with greater than 50%  respiratory variability, suggesting right atrial pressure of 3 mmHg.  12. The interatrial septum was not well visualized.   Recent Labs: 12/04/2019: B Natriuretic Peptide 24.0 04/18/2020: TSH 2.133 05/03/2020: Hemoglobin 12.7; Platelets 259 05/08/2020: ALT 20; BUN 7; Creat 0.68; Potassium 3.9; Sodium 139 05/18/2020: Magnesium 2.1  Recent Lipid Panel     Component Value Date/Time   CHOL 222 (H) 01/20/2020 0417   TRIG 306 (H) 01/20/2020 0417   HDL 55 01/20/2020 0417   CHOLHDL 4.0 01/20/2020 0417   VLDL 61 (H) 01/20/2020 0417   LDLCALC 106 (H) 01/20/2020  0417   LDLDIRECT 135.0 06/29/2019 1458    Physical Exam:    VS:  BP (!) 152/98   Pulse 92   Ht 5\' 4"  (1.626 m)   Wt 263 lb 9.6 oz (119.6 kg)   LMP 10/09/2014   BMI 45.25 kg/m     Wt Readings from Last 3 Encounters:  05/31/20 263 lb 9.6 oz (119.6 kg)  05/08/20 261 lb 9.6 oz (118.7 kg)  05/03/20 266 lb (120.7 kg)     GEN:  in no acute distress HEENT: Normal NECK: No JVD LYMPHATICS: No lymphadenopathy CARDIAC: RRR, no murmurs, rubs, gallops RESPIRATORY:  Clear to auscultation without rales, wheezing or rhonchi  ABDOMEN: Soft, non-tender, non-distended MUSCULOSKELETAL:  trace edema; No deformity  SKIN: Warm and dry NEUROLOGIC:  Alert and oriented x 3 PSYCHIATRIC:  Normal affect   ASSESSMENT:    1. Essential hypertension   2. Chest pain of uncertain etiology   3. DOE (dyspnea on exertion)   4. Hyperlipidemia, unspecified hyperlipidemia type    PLAN:    Dyspnea/chest pain/fatigue/myalgias: initially thought to be secondary to statin use, but continued to have symptoms with discontinuation of statin.  Suspect hypothyroidism contributing, has improved since thyroid levels have normalized.  Asthma also likely contributing, symptoms significantly improved with treatment of asthma. No evidence of cardiac etiology.  TTE shows no structural heart disease.  Myoview shows no perfusion defect  Hypertension: Reports has not required blood pressure medicine since she started CPAP for her OSA.  Has been elevated recently.  Recommend starting losartan 25 mg daily.  Check BMP in 1 week.  Asked patient to monitor BP daily for next 2 weeks call with results.  Hyperlipidemia: LDL 135 on 06/29/2019. Her 10-year ASCVD risk score is 1.7%, so she would be at low risk for an ASCVD  event.Stopped statin on 08/18/2019.  OSA: On CPAP  RTC in 6 months  Medication Adjustments/Labs and Tests Ordered: Current medicines are reviewed at length with the patient today.  Concerns regarding medicines are outlined above.  Orders Placed This Encounter  Procedures  . Basic metabolic panel   Meds ordered this encounter  Medications  . losartan (COZAAR) 25 MG tablet    Sig: Take 1 tablet (25 mg total) by mouth daily.    Dispense:  90 tablet    Refill:  3    Patient Instructions  Medication Instructions:  START Losartan 25 mg daily  *If you need a refill on your cardiac medications before your next appointment, please call your pharmacy*   Lab Work: [facility] in [place]  If you have labs (blood work) drawn today and your tests are completely normal, you will receive your results only by: Marland Kitchen MyChart Message (if you have MyChart) OR . A paper copy in the mail If you have any lab test that is abnormal or we need to change your treatment, we will call you to review the results.  Follow-Up: At [facility], you and your health needs are our priority.  As part of our continuing mission to provide you with exceptional heart care, we have created designated Provider Care Teams.  These Care Teams include your primary Cardiologist (physician) and Advanced Practice Providers (APPs -  Physician Assistants and Nurse Practitioners) who all work together to provide you with the care you need, when you need it.  We recommend signing up for the patient portal called "MyChart".  Sign up information is provided on this After Visit Summary.  MyChart is used to  connect with patients for Virtual Visits (Telemedicine).  Patients are able to view lab/test results, encounter notes, upcoming appointments, etc.  Non-urgent messages can be sent to your provider as well.   To learn more about what you can do with MyChart, go to [email or web address].    Your next appointment:   6 month(s)  The  format for your next appointment:   In Person  Provider:   Oswaldo Milian, MD   Other Instructions Please check your blood pressure at home daily, write it down.  Call the office or send message via Mychart with the readings in 2 weeks for Dr. Gardiner Rhyme to review.       Signed, Donato Heinz, MD  05/31/2020 5:21 PM    [place]

## 2020-05-31 ENCOUNTER — Encounter: Payer: Self-pay | Admitting: Cardiology

## 2020-05-31 ENCOUNTER — Other Ambulatory Visit: Payer: Self-pay

## 2020-05-31 ENCOUNTER — Ambulatory Visit (INDEPENDENT_AMBULATORY_CARE_PROVIDER_SITE_OTHER): Payer: BC Managed Care – PPO | Admitting: Cardiology

## 2020-05-31 VITALS — BP 152/98 | HR 92 | Ht 64.0 in | Wt 263.6 lb

## 2020-05-31 DIAGNOSIS — R0600 Dyspnea, unspecified: Secondary | ICD-10-CM | POA: Diagnosis not present

## 2020-05-31 DIAGNOSIS — E785 Hyperlipidemia, unspecified: Secondary | ICD-10-CM | POA: Diagnosis not present

## 2020-05-31 DIAGNOSIS — R0609 Other forms of dyspnea: Secondary | ICD-10-CM

## 2020-05-31 DIAGNOSIS — I10 Essential (primary) hypertension: Secondary | ICD-10-CM | POA: Diagnosis not present

## 2020-05-31 DIAGNOSIS — R079 Chest pain, unspecified: Secondary | ICD-10-CM | POA: Diagnosis not present

## 2020-05-31 MED ORDER — LOSARTAN POTASSIUM 25 MG PO TABS
25.0000 mg | ORAL_TABLET | Freq: Every day | ORAL | 3 refills | Status: DC
Start: 2020-05-31 — End: 2021-05-30

## 2020-05-31 NOTE — Patient Instructions (Signed)
Medication Instructions:  START Losartan 25 mg daily  *If you need a refill on your cardiac medications before your next appointment, please call your pharmacy*   Lab Work: BMET in [place]  If you have labs (blood work) drawn today and your tests are completely normal, you will receive your results only by:  [place] (if you have MyChart) OR  A paper copy in the mail If you have any lab test that is abnormal or we need to change your treatment, we will call you to review the results.  Follow-Up: At [facility], you and your health needs are our priority.  As part of our continuing mission to provide you with exceptional heart care, we have created designated Provider Care Teams.  These Care Teams include your primary Cardiologist (physician) and Advanced Practice Providers (APPs -  Physician Assistants and Nurse Practitioners) who all work together to provide you with the care you need, when you need it.  We recommend signing up for the patient portal called "MyChart".  Sign up information is provided on this After Visit Summary.  MyChart is used to connect with patients for Virtual Visits (Telemedicine).  Patients are able to view lab/test results, encounter notes, upcoming appointments, etc.  Non-urgent messages can be sent to your provider as well.   To learn more about what you can do with MyChart, go to [email or web address].    Your next appointment:   6 month(s)  The format for your next appointment:   In Person  Provider:   Oswaldo Milian, MD   Other Instructions Please check your blood pressure at home daily, write it down.  Call the office or send message via Mychart with the readings in 2 weeks for Dr. Gardiner Rhyme to review.

## 2020-06-01 ENCOUNTER — Telehealth: Payer: Self-pay | Admitting: Cardiology

## 2020-06-01 NOTE — Telephone Encounter (Signed)
New Message:   Pt said Dr Gilman Schmidt started her on Losartan yesterday. She wants to know if she needs to take it in the morning? Also she said does she need to take it if her blood pressure is good?

## 2020-06-01 NOTE — Telephone Encounter (Signed)
Pt advised to take daily every morning and keep track of her BP over next several weeks.   Advised to call us if she has any issues after starting. Advised to call office in several weeks to let us know how her BPs are doing. Pt agreeable to plan.

## 2020-06-06 DIAGNOSIS — R0981 Nasal congestion: Secondary | ICD-10-CM | POA: Diagnosis not present

## 2020-06-06 DIAGNOSIS — Z6841 Body Mass Index (BMI) 40.0 and over, adult: Secondary | ICD-10-CM | POA: Diagnosis not present

## 2020-06-06 DIAGNOSIS — J0100 Acute maxillary sinusitis, unspecified: Secondary | ICD-10-CM | POA: Diagnosis not present

## 2020-06-06 DIAGNOSIS — H6980 Other specified disorders of Eustachian tube, unspecified ear: Secondary | ICD-10-CM | POA: Diagnosis not present

## 2020-06-08 DIAGNOSIS — E063 Autoimmune thyroiditis: Secondary | ICD-10-CM | POA: Diagnosis not present

## 2020-06-08 DIAGNOSIS — H6993 Unspecified Eustachian tube disorder, bilateral: Secondary | ICD-10-CM | POA: Diagnosis not present

## 2020-06-08 DIAGNOSIS — M26603 Bilateral temporomandibular joint disorder, unspecified: Secondary | ICD-10-CM | POA: Diagnosis not present

## 2020-06-08 DIAGNOSIS — H6523 Chronic serous otitis media, bilateral: Secondary | ICD-10-CM | POA: Diagnosis not present

## 2020-06-08 DIAGNOSIS — J329 Chronic sinusitis, unspecified: Secondary | ICD-10-CM | POA: Diagnosis not present

## 2020-06-08 DIAGNOSIS — I10 Essential (primary) hypertension: Secondary | ICD-10-CM | POA: Diagnosis not present

## 2020-06-08 DIAGNOSIS — Z6841 Body Mass Index (BMI) 40.0 and over, adult: Secondary | ICD-10-CM | POA: Diagnosis not present

## 2020-06-13 DIAGNOSIS — M546 Pain in thoracic spine: Secondary | ICD-10-CM | POA: Diagnosis not present

## 2020-06-13 DIAGNOSIS — Z6841 Body Mass Index (BMI) 40.0 and over, adult: Secondary | ICD-10-CM | POA: Diagnosis not present

## 2020-06-14 DIAGNOSIS — M546 Pain in thoracic spine: Secondary | ICD-10-CM | POA: Diagnosis not present

## 2020-06-14 DIAGNOSIS — I10 Essential (primary) hypertension: Secondary | ICD-10-CM | POA: Diagnosis not present

## 2020-06-14 DIAGNOSIS — M542 Cervicalgia: Secondary | ICD-10-CM | POA: Diagnosis not present

## 2020-06-14 DIAGNOSIS — Z6841 Body Mass Index (BMI) 40.0 and over, adult: Secondary | ICD-10-CM | POA: Diagnosis not present

## 2020-06-20 ENCOUNTER — Encounter (HOSPITAL_COMMUNITY): Payer: Self-pay | Admitting: *Deleted

## 2020-06-20 ENCOUNTER — Other Ambulatory Visit: Payer: Self-pay

## 2020-06-20 DIAGNOSIS — R197 Diarrhea, unspecified: Secondary | ICD-10-CM | POA: Insufficient documentation

## 2020-06-20 DIAGNOSIS — Z5321 Procedure and treatment not carried out due to patient leaving prior to being seen by health care provider: Secondary | ICD-10-CM | POA: Insufficient documentation

## 2020-06-20 DIAGNOSIS — R531 Weakness: Secondary | ICD-10-CM | POA: Diagnosis not present

## 2020-06-20 DIAGNOSIS — R5383 Other fatigue: Secondary | ICD-10-CM | POA: Diagnosis not present

## 2020-06-20 DIAGNOSIS — I10 Essential (primary) hypertension: Secondary | ICD-10-CM | POA: Diagnosis not present

## 2020-06-20 LAB — COMPREHENSIVE METABOLIC PANEL
ALT: 25 U/L (ref 0–44)
AST: 18 U/L (ref 15–41)
Albumin: 3.9 g/dL (ref 3.5–5.0)
Alkaline Phosphatase: 50 U/L (ref 38–126)
Anion gap: 12 (ref 5–15)
BUN: 9 mg/dL (ref 6–20)
CO2: 24 mmol/L (ref 22–32)
Calcium: 9.3 mg/dL (ref 8.9–10.3)
Chloride: 99 mmol/L (ref 98–111)
Creatinine, Ser: 0.63 mg/dL (ref 0.44–1.00)
GFR calc Af Amer: >60 mL/min (ref >60)
GFR calc non Af Amer: >60 mL/min (ref >60)
Glucose, Bld: 125 mg/dL — ABNORMAL HIGH (ref 70–99)
Potassium: 3.7 mmol/L (ref 3.5–5.1)
Sodium: 135 mmol/L (ref 135–145)
Total Bilirubin: 0.4 mg/dL (ref 0.3–1.2)
Total Protein: 7.8 g/dL (ref 6.5–8.1)

## 2020-06-20 LAB — CBC
HCT: 39.9 % (ref 36.0–46.0)
Hemoglobin: 12.9 g/dL (ref 12.0–15.0)
MCH: 29.5 pg (ref 26.0–34.0)
MCHC: 32.3 g/dL (ref 30.0–36.0)
MCV: 91.1 fL (ref 80.0–100.0)
Platelets: 289 10*3/uL (ref 150–400)
RBC: 4.38 MIL/uL (ref 3.87–5.11)
RDW: 14.4 % (ref 11.5–15.5)
WBC: 13.3 10*3/uL — ABNORMAL HIGH (ref 4.0–10.5)
nRBC: 0 % (ref 0.0–0.2)

## 2020-06-20 LAB — LIPASE, BLOOD: Lipase: 40 U/L (ref 11–51)

## 2020-06-20 NOTE — ED Triage Notes (Signed)
Pt reports she feels weak, flushed, having diarrhea, and lethargic. Symptoms since this morning.  Reports recent addition of HCTZ by her doctor last week.

## 2020-06-21 ENCOUNTER — Emergency Department (HOSPITAL_COMMUNITY)
Admission: EM | Admit: 2020-06-21 | Discharge: 2020-06-21 | Disposition: A | Discharge status: Home / Self Care | Payer: BC Managed Care – PPO | Attending: Emergency Medicine | Admitting: Emergency Medicine

## 2020-06-22 ENCOUNTER — Ambulatory Visit (INDEPENDENT_AMBULATORY_CARE_PROVIDER_SITE_OTHER): Payer: BC Managed Care – PPO | Admitting: Endocrinology

## 2020-06-22 ENCOUNTER — Other Ambulatory Visit: Payer: Self-pay

## 2020-06-22 ENCOUNTER — Telehealth: Payer: Self-pay | Admitting: Endocrinology

## 2020-06-22 VITALS — BP 154/100 | HR 100 | Ht 64.0 in | Wt 260.0 lb

## 2020-06-22 DIAGNOSIS — E538 Deficiency of other specified B group vitamins: Secondary | ICD-10-CM | POA: Diagnosis not present

## 2020-06-22 DIAGNOSIS — R232 Flushing: Secondary | ICD-10-CM

## 2020-06-22 DIAGNOSIS — E038 Other specified hypothyroidism: Secondary | ICD-10-CM

## 2020-06-22 DIAGNOSIS — E559 Vitamin D deficiency, unspecified: Secondary | ICD-10-CM

## 2020-06-22 LAB — MAGNESIUM: Magnesium: 1.7 mg/dL (ref 1.5–2.5)

## 2020-06-22 LAB — BASIC METABOLIC PANEL
BUN: 9 mg/dL (ref 6–23)
CO2: 28 mEq/L (ref 19–32)
Calcium: 9.5 mg/dL (ref 8.4–10.5)
Chloride: 99 mEq/L (ref 96–112)
Creatinine, Ser: 0.73 mg/dL (ref 0.40–1.20)
GFR: 86.48 mL/min (ref >60.00)
Glucose, Bld: 125 mg/dL — ABNORMAL HIGH (ref 70–99)
Potassium: 3.3 mEq/L — ABNORMAL LOW (ref 3.5–5.1)
Sodium: 138 mEq/L (ref 135–145)

## 2020-06-22 LAB — TSH: TSH: 1.3 u[IU]/mL (ref 0.35–4.50)

## 2020-06-22 LAB — VITAMIN D 25 HYDROXY (VIT D DEFICIENCY, FRACTURES): VITD: 49.86 ng/mL (ref 30.00–100.00)

## 2020-06-22 LAB — FOLLICLE STIMULATING HORMONE: FSH: 8.3 m[IU]/mL

## 2020-06-22 LAB — VITAMIN B12: Vitamin B-12: 644 pg/mL (ref 211–911)

## 2020-06-22 LAB — T4, FREE: Free T4: 0.92 ng/dL (ref 0.60–1.60)

## 2020-06-22 LAB — LUTEINIZING HORMONE: LH: 5.33 m[IU]/mL

## 2020-06-22 NOTE — Telephone Encounter (Signed)
Patient called to advise that she received message from Dr Loanne Drilling about her labs.  She states that she is having symptoms of low potassium. Symptoms are heart palpitations, weakness, and leg cramps.  Patient advised message will be sent, but that if she feels like she is experiencing anything acute to please seek additional care from urgent care or ED

## 2020-06-22 NOTE — Patient Instructions (Signed)
Your blood pressure is high today.  Please see your primary care provider soon, to have it rechecked. Based on the results, we'll stop the magnesium if we can, as this can contribute to diarrhea.   Blood tests are requested for you today.  We'll let you know about the results.

## 2020-06-22 NOTE — Progress Notes (Signed)
Subjective:    Patient ID: Heather Fry, female    DOB: 11-Oct-1975, 44 y.o.   MRN: [id number]  HPI Pt returns for f/u of postsurgical hypothyroidism (she had thyroidectomy 1/20, for hyperthyroidism; she was rx'ed synthroid soon thereafter).   She also has mild postsurgical hypoparathyroidism (she takes vit-D, 5000 units/day).  No recent Ca++ supplement.  She also has hypomagnesemia (she takes 250 mg qd).   She also has hyperglycemia.   She reports these ongoing sxs: lightheadedness, "hot flashes," generalized weakness, fatigue, diarrhea, and anxiety (these have been normal: serum catechols, cortisol, and FSH/LH; she has had TAH but not BSO).  Rheumatologist rxs medrol 4 mg qd.   B-12 def: she stopped, due to high level.   Past Medical History:  Diagnosis Date  . Anxiety   . Arthritis   . Depression   . Diverticulosis   . GERD (gastroesophageal reflux disease)   . Graves disease   . Internal hemorrhoids   . Thyroid disease   . Uterine fibroid     Past Surgical History:  Procedure Laterality Date  . ABDOMINAL HYSTERECTOMY     Per patient, uterine fibroids.  . ABDOMINAL HYSTERECTOMY    . CHOLECYSTECTOMY N/A 11/06/2014   Procedure: LAPAROSCOPIC CHOLECYSTECTOMY;  Surgeon: Jamesetta So, MD;  Location: [facility];  Service: General;  Laterality: N/A;  . COLONOSCOPY N/A 03/04/2017   Dr. Gala Romney: Hemorrhoids, diverticulosis, benign polyp without adenomatous changes.  Next colonoscopy 10 years  . DILATION AND CURETTAGE OF UTERUS    . ESOPHAGOGASTRODUODENOSCOPY N/A 10/02/2014   Dr. Gala Romney: fundic gland polyps, hiatal hernia  . MOUTH SURGERY     extraction of teeth  . POLYPECTOMY  03/04/2017   Procedure: POLYPECTOMY;  Surgeon: Daneil Dolin, MD;  Location: [facility];  Service: Endoscopy;;  colon  . THYROIDECTOMY N/A 09/30/2018   Procedure: TOTAL THYROIDECTOMY;  Surgeon: Armandina Gemma, MD;  Location: [facility];  Service: General;  Laterality: N/A;  . TUBAL LIGATION      Social History    Socioeconomic History  . Marital status: Married    Spouse name: Not on file  . Number of children: Not on file  . Years of education: Not on file  . Highest education level: Not on file  Occupational History  . Not on file  Tobacco Use  . Smoking status: Former Smoker    Packs/day: 1.00    Years: 5.00    Pack years: 5.00    Types: Cigarettes    Quit date: 11/03/1997    Years since quitting: 22.6  . Smokeless tobacco: Never Used  . Tobacco comment: 1 pack 1 week  Vaping Use  . Vaping Use: Never used  Substance and Sexual Activity  . Alcohol use: No    Alcohol/week: 0.0 standard drinks  . Drug use: No  . Sexual activity: Yes    Birth control/protection: Surgical  Other Topics Concern  . Not on file  Social History Narrative  . Not on file   Social Determinants of Health   Financial Resource Strain:   . Difficulty of Paying Living Expenses: Not on file  Food Insecurity:   . Worried About Charity fundraiser in the Last Year: Not on file  . Ran Out of Food in the Last Year: Not on file  Transportation Needs:   . Lack of Transportation (Medical): Not on file  . Lack of Transportation (Non-Medical): Not on file  Physical Activity:   . Days of Exercise per  Week: Not on file  . Minutes of Exercise per Session: Not on file  Stress:   . Feeling of Stress : Not on file  Social Connections:   . Frequency of Communication with Friends and Family: Not on file  . Frequency of Social Gatherings with Friends and Family: Not on file  . Attends Religious Services: Not on file  . Active Member of Clubs or Organizations: Not on file  . Attends Archivist Meetings: Not on file  . Marital Status: Not on file  Intimate Partner Violence:   . Fear of Current or Ex-Partner: Not on file  . Emotionally Abused: Not on file  . Physically Abused: Not on file  . Sexually Abused: Not on file    Current Outpatient Medications on File Prior to Visit  Medication Sig Dispense  Refill  . acetaminophen (TYLENOL) 500 MG tablet Take 1,000 mg by mouth daily as needed for moderate pain.    Marland Kitchen albuterol (VENTOLIN HFA) 108 (90 Base) MCG/ACT inhaler Inhale 2 puffs into the lungs every 6 (six) hours as needed. 18 g 11  . budesonide-formoterol (SYMBICORT) 160-4.5 MCG/ACT inhaler Inhale 2 puffs into the lungs in the morning and at bedtime. 1 Inhaler 11  . Cholecalciferol (VITAMIN D3) 125 MCG (5000 UT) CAPS Take 1 capsule (5,000 Units total) by mouth daily. 30 capsule 11  . escitalopram (LEXAPRO) 20 MG tablet Take 1 tablet (20 mg total) by mouth at bedtime. 30 tablet 5  . Magnesium 250 MG TABS Take 250 mg by mouth at bedtime.     . methylPREDNISolone (MEDROL) 4 MG tablet Take 4 mg by mouth in the morning and at bedtime.    . montelukast (SINGULAIR) 10 MG tablet Take 1 tablet (10 mg total) by mouth at bedtime. 30 tablet 11  . NON FORMULARY at bedtime. CPAP at [facility]     . omeprazole (PRILOSEC) 20 MG capsule Take 20 mg by mouth 2 (two) times daily before a meal.    . RESTASIS 0.05 % ophthalmic emulsion Place 1 drop into both eyes 2 (two) times daily.    . simethicone (PHAZYME) 125 MG chewable tablet Chew 125-250 mg by mouth every 6 (six) hours as needed for flatulence.     Marland Kitchen SYNTHROID 150 MCG tablet Take 1 tablet (150 mcg total) by mouth daily before breakfast. 90 tablet 3  . Tiotropium Bromide Monohydrate (SPIRIVA RESPIMAT) 1.25 MCG/ACT AERS Inhale 2 puffs into the lungs daily. 4 g 11  . Wheat Dextrin (BENEFIBER DRINK MIX PO) Take 1 Dose by mouth 3 (three) times daily.     Marland Kitchen dicyclomine (BENTYL) 10 MG capsule Take 1 capsule (10 mg total) by mouth 4 (four) times daily -  before meals and at bedtime. As needed to prevent diarrhea and help with abdominal pain. (Patient not taking: Reported on 06/24/2020) 120 capsule 0  . losartan (COZAAR) 25 MG tablet Take 1 tablet (25 mg total) by mouth daily. (Patient not taking: Reported on 06/24/2020) 90 tablet 3   No current facility-administered  medications on file prior to visit.    Allergies  Allergen Reactions  . Levofloxacin Other (See Comments)    Pt can not have due to taking Lexapro   . Toradol [Ketorolac Tromethamine] Anaphylaxis and Hives  . Other Other (See Comments)  . Chamomile   . Hctz [Hydrochlorothiazide] Other (See Comments)    Dehydration  . Losartan Potassium   . Penicillins Other (See Comments)    Loopy, childhood allergy  Family History  Problem Relation Age of Onset  . Hypertension Father   . Diabetes Father   . Depression Other   . Uterine cancer Mother        ?cervical cancer  . Uterine cancer Sister        suicide  . Colon cancer Paternal Aunt 19  . Thyroid disease Neg Hx     BP (!) 154/100   Pulse 100   Ht 5\' 4"  (1.626 m)   Wt 260 lb (117.9 kg)   LMP 10/09/2014   SpO2 98%   BMI 44.63 kg/m    Review of Systems Denies fever    Objective:   Physical Exam VITAL SIGNS:  See vs page GENERAL: no distress Neck: a healed scar is present.  I do not appreciate a nodule in the thyroid or elsewhere in the neck.   LUNGS:  Clear to auscultation HEART:  Regular rate and rhythm without murmurs noted. Normal S1,S2.   Ext: trace bilat leg edema  Lab Results  Component Value Date   TSH 1.30 06/22/2020   Mg++=normal    Assessment & Plan:  B-12 def: recheck today Hypothyroidism: well-replaced.  Please continue the same synthroid. Hypomagnesemia: pt is advised to reduce Mg++ tabs, due to diarrhea. HTN: f/u with primary care provider.

## 2020-06-24 ENCOUNTER — Encounter (HOSPITAL_COMMUNITY): Payer: Self-pay | Admitting: Emergency Medicine

## 2020-06-24 ENCOUNTER — Emergency Department (HOSPITAL_COMMUNITY)
Admission: EM | Admit: 2020-06-24 | Discharge: 2020-06-24 | Disposition: A | Discharge status: Home / Self Care | Payer: BC Managed Care – PPO | Attending: Emergency Medicine | Admitting: Emergency Medicine

## 2020-06-24 ENCOUNTER — Emergency Department (HOSPITAL_COMMUNITY): Payer: BC Managed Care – PPO

## 2020-06-24 ENCOUNTER — Other Ambulatory Visit: Payer: Self-pay

## 2020-06-24 DIAGNOSIS — Z87891 Personal history of nicotine dependence: Secondary | ICD-10-CM | POA: Diagnosis not present

## 2020-06-24 DIAGNOSIS — R531 Weakness: Secondary | ICD-10-CM | POA: Diagnosis not present

## 2020-06-24 DIAGNOSIS — E039 Hypothyroidism, unspecified: Secondary | ICD-10-CM | POA: Diagnosis not present

## 2020-06-24 DIAGNOSIS — E876 Hypokalemia: Secondary | ICD-10-CM | POA: Insufficient documentation

## 2020-06-24 DIAGNOSIS — Z79890 Hormone replacement therapy (postmenopausal): Secondary | ICD-10-CM | POA: Insufficient documentation

## 2020-06-24 DIAGNOSIS — R079 Chest pain, unspecified: Secondary | ICD-10-CM | POA: Diagnosis not present

## 2020-06-24 DIAGNOSIS — R9431 Abnormal electrocardiogram [ECG] [EKG]: Secondary | ICD-10-CM | POA: Diagnosis not present

## 2020-06-24 LAB — URINALYSIS, ROUTINE W REFLEX MICROSCOPIC
Bilirubin Urine: NEGATIVE
Glucose, UA: NEGATIVE mg/dL
Ketones, ur: NEGATIVE mg/dL
Leukocytes,Ua: NEGATIVE
Nitrite: NEGATIVE
Protein, ur: NEGATIVE mg/dL
Specific Gravity, Urine: 1.004 — ABNORMAL LOW (ref 1.005–1.030)
pH: 7 (ref 5.0–8.0)

## 2020-06-24 LAB — CBC
HCT: 39.4 % (ref 36.0–46.0)
Hemoglobin: 12.6 g/dL (ref 12.0–15.0)
MCH: 29.7 pg (ref 26.0–34.0)
MCHC: 32 g/dL (ref 30.0–36.0)
MCV: 92.9 fL (ref 80.0–100.0)
Platelets: 275 10*3/uL (ref 150–400)
RBC: 4.24 MIL/uL (ref 3.87–5.11)
RDW: 14.4 % (ref 11.5–15.5)
WBC: 14.2 10*3/uL — ABNORMAL HIGH (ref 4.0–10.5)
nRBC: 0 % (ref 0.0–0.2)

## 2020-06-24 LAB — BASIC METABOLIC PANEL
Anion gap: 10 (ref 5–15)
BUN: 9 mg/dL (ref 6–20)
CO2: 26 mmol/L (ref 22–32)
Calcium: 8.6 mg/dL — ABNORMAL LOW (ref 8.9–10.3)
Chloride: 99 mmol/L (ref 98–111)
Creatinine, Ser: 0.63 mg/dL (ref 0.44–1.00)
GFR calc Af Amer: >60 mL/min (ref >60)
GFR calc non Af Amer: >60 mL/min (ref >60)
Glucose, Bld: 104 mg/dL — ABNORMAL HIGH (ref 70–99)
Potassium: 3.6 mmol/L (ref 3.5–5.1)
Sodium: 135 mmol/L (ref 135–145)

## 2020-06-24 LAB — TROPONIN I (HIGH SENSITIVITY)
Troponin I (High Sensitivity): 4 ng/L (ref <18)
Troponin I (High Sensitivity): 4 ng/L (ref <18)

## 2020-06-24 IMAGING — DX DG CHEST 2V
2 series · 2 of 2 positions shown · non-contrast
Comparison: Chest radiograph [DATE]

CLINICAL DATA: Patient with chest pain.

EXAM:
CHEST - 2 VIEW

[chest pa]
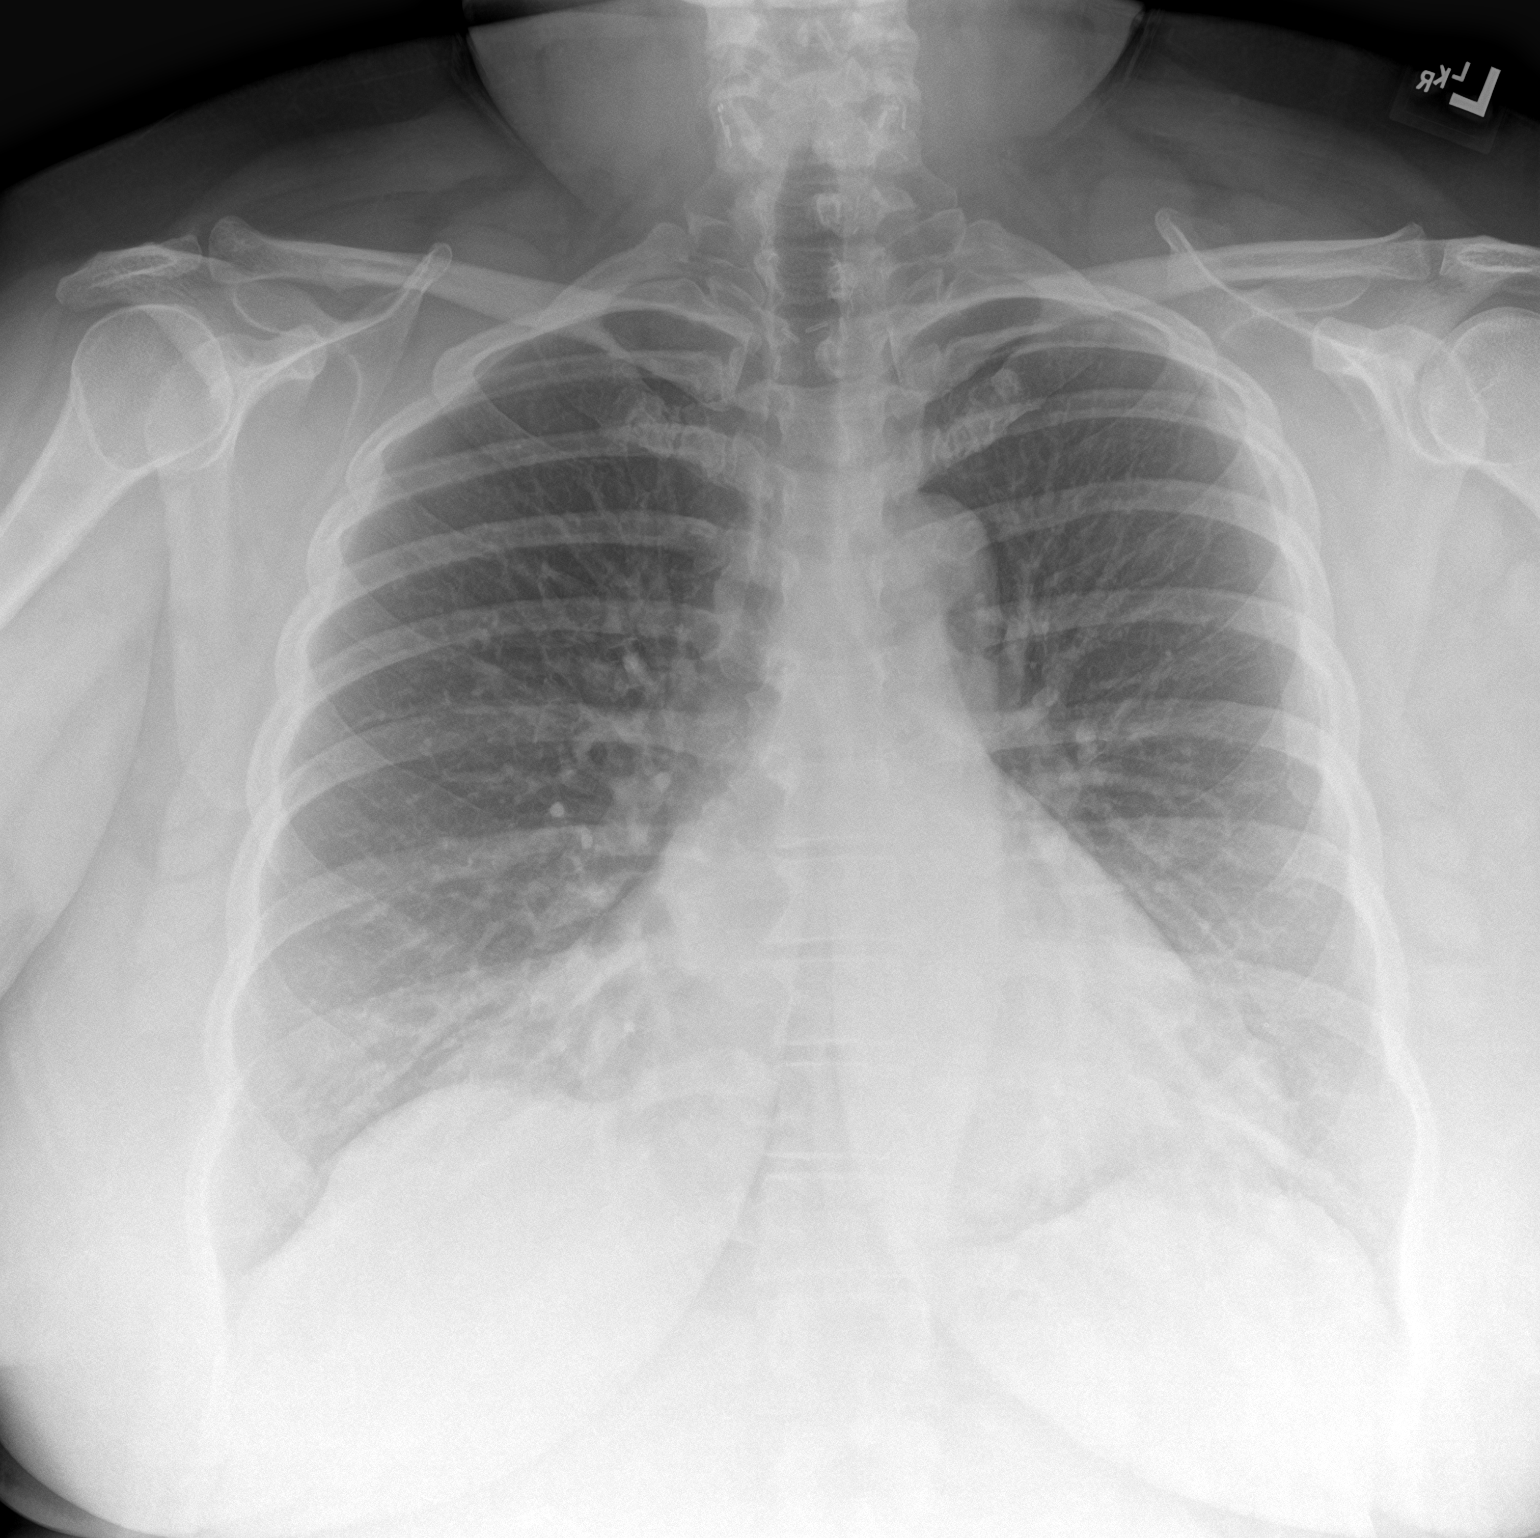

[chest lat]
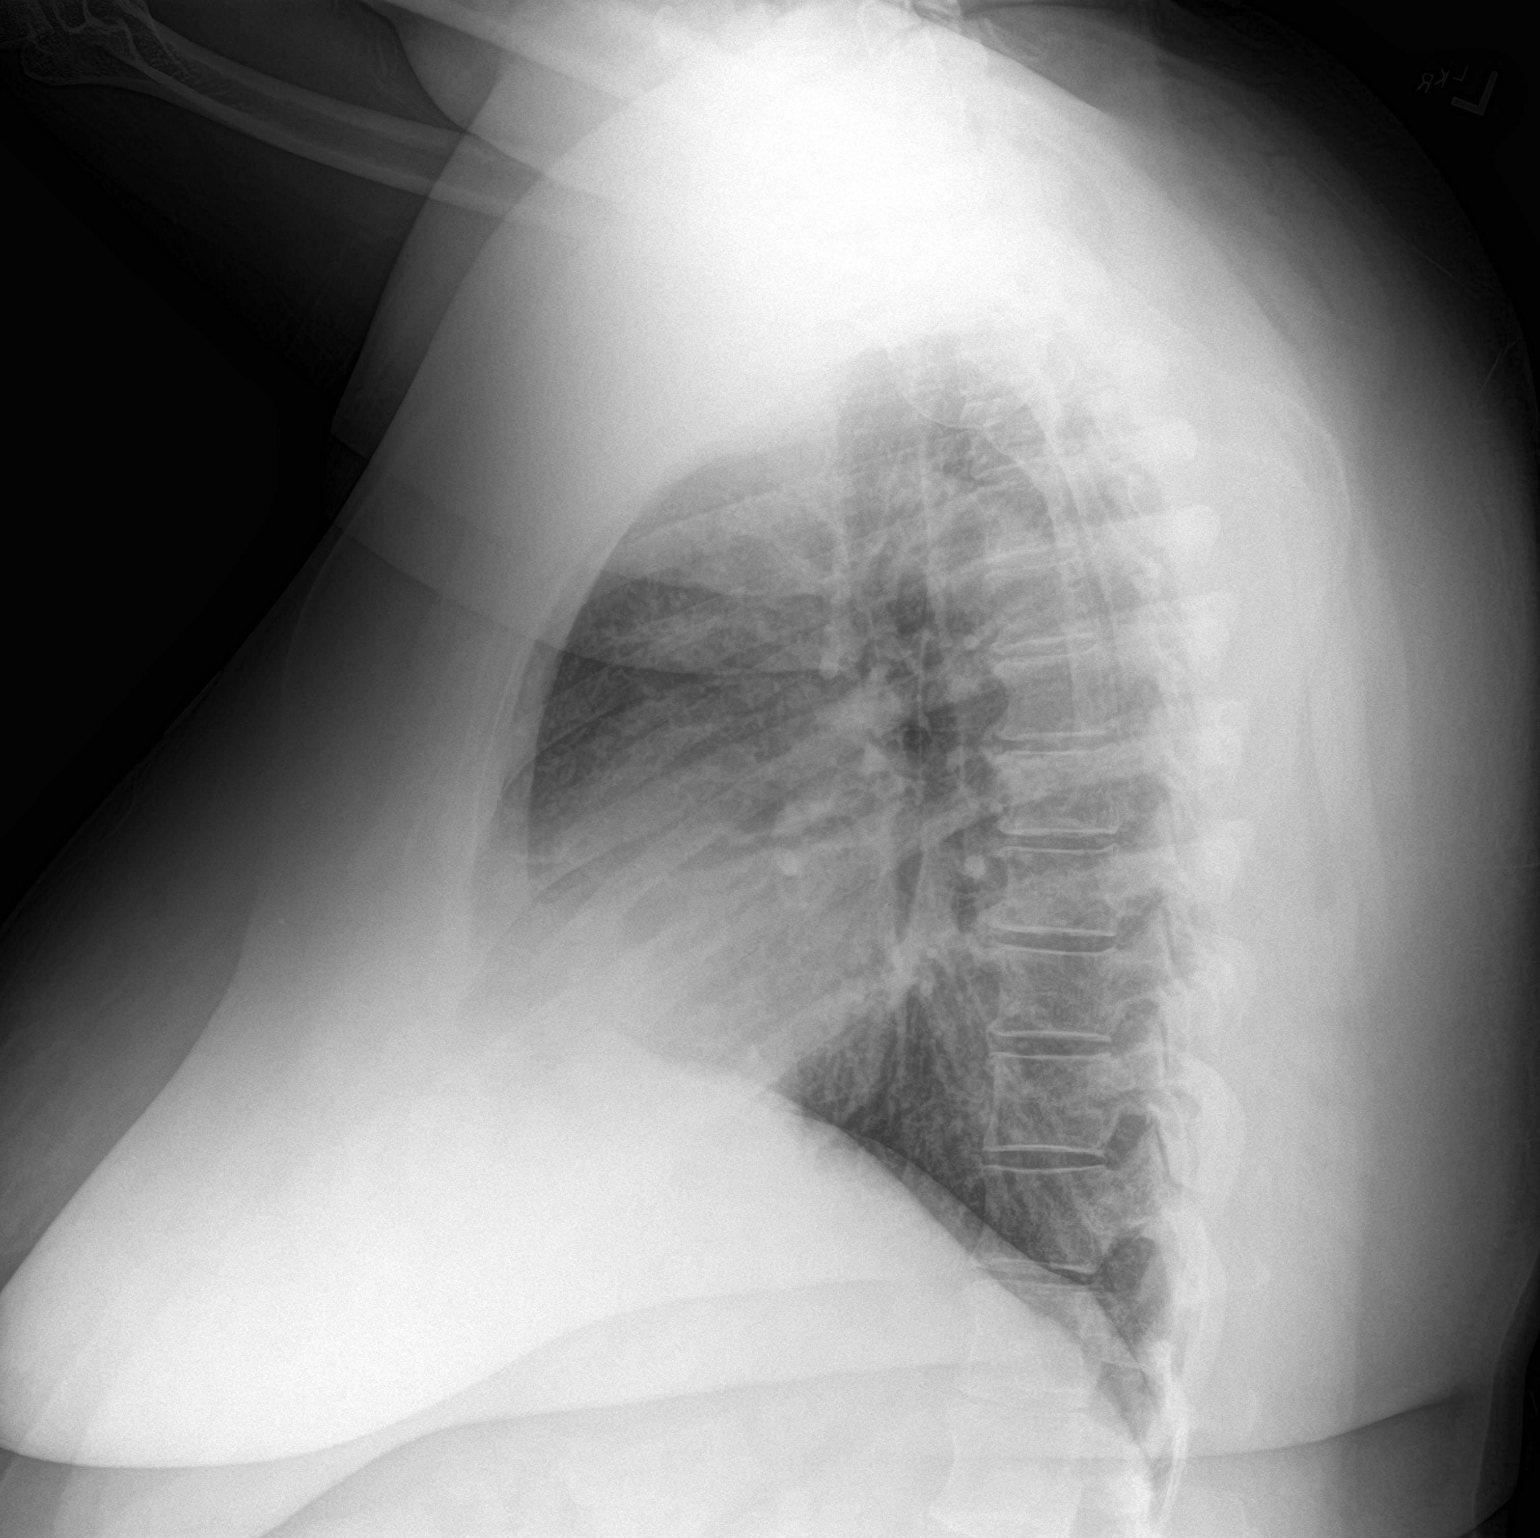

[2 of 2 positions shown; findings below may reference images not displayed]

FINDINGS: Normal cardiac and mediastinal contours. No large area pulmonary
consolidation. No pleural effusion or pneumothorax. Thoracic spine
degenerative changes.
IMPRESSION: No acute cardiopulmonary process.

## 2020-06-24 NOTE — ED Provider Notes (Signed)
[facility] EMERGENCY DEPARTMENT Provider Note   CSN: [id number] Arrival date & time: 06/24/20  1258     History Chief Complaint  Patient presents with  . Abnormal Lab    Heather Fry is a 44 y.o. female.  Patient complains of weakness. Patient states that she has had low potassium and was treated with potassium recently. She was on HCTZ. But she still feels bad  The history is provided by the patient and medical records. No language interpreter was used.  Weakness Severity:  Mild Onset quality:  Sudden Timing:  Constant Progression:  Waxing and waning Chronicity:  New Context: not alcohol use   Relieved by:  Nothing Associated symptoms: no abdominal pain, no chest pain, no cough, no diarrhea, no frequency, no headaches and no seizures        Past Medical History:  Diagnosis Date  . Anxiety   . Arthritis   . Depression   . Diverticulosis   . GERD (gastroesophageal reflux disease)   . Graves disease   . Internal hemorrhoids   . Thyroid disease   . Uterine fibroid     Patient Active Problem List   Diagnosis Date Noted  . Diarrhea 05/08/2020  . Idiopathic peripheral neuropathy 04/18/2020  . Myelopathy ([place]) 04/18/2020  . OSA on CPAP 04/18/2020  . RLS (restless legs syndrome) 04/18/2020  . Sensory disturbance 01/20/2020  . Atypical chest pain 01/20/2020  . Obesity, Class III, BMI 40-49.9 (morbid obesity) ([place]) 01/20/2020  . Weakness 01/19/2020  . Hyperglycemia 10/04/2019  . Myalgia 09/05/2019  . Vitamin B 12 deficiency 07/28/2019  . Right sided numbness 06/29/2019  . Constipation 06/15/2019  . Elevated lipase 06/15/2019  . Hypomagnesemia 03/29/2019  . Leukocytosis 03/29/2019  . Abdominal pain 02/10/2019  . Nausea without vomiting 02/10/2019  . Tingling of face 11/30/2018  . Hot flashes 11/30/2018  . Vitamin D deficiency 10/06/2018  . GERD (gastroesophageal reflux disease) 01/28/2017  . Rectal bleeding 01/28/2017  . 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  Hypothyroidism 04/14/2016  . Gastric polyp   . RUQ abdominal pain 09/06/2014  . Excessive or frequent menstruation 01/13/2013  . Depression     Past Surgical History:  Procedure Laterality Date  . ABDOMINAL HYSTERECTOMY     Per patient, uterine fibroids.  . ABDOMINAL HYSTERECTOMY    . CHOLECYSTECTOMY N/A 11/06/2014   Procedure: LAPAROSCOPIC CHOLECYSTECTOMY;  Surgeon: Jamesetta So, MD;  Location: [facility];  Service: General;  Laterality: N/A;  . COLONOSCOPY N/A 03/04/2017   Dr. Gala Romney: Hemorrhoids, diverticulosis, benign polyp without adenomatous changes.  Next colonoscopy 10 years  . DILATION AND CURETTAGE OF UTERUS    . ESOPHAGOGASTRODUODENOSCOPY N/A 10/02/2014   Dr. Gala Romney: fundic gland polyps, hiatal hernia  . MOUTH SURGERY     extraction of teeth  . POLYPECTOMY  03/04/2017   Procedure: POLYPECTOMY;  Surgeon: Daneil Dolin, MD;  Location: [facility];  Service: Endoscopy;;  colon  . THYROIDECTOMY N/A 09/30/2018   Procedure: TOTAL THYROIDECTOMY;  Surgeon: Armandina Gemma, MD;  Location: [facility];  Service: General;  Laterality: N/A;  . TUBAL LIGATION       OB History    Gravida  2   Para  2   Term  2   Preterm      AB      Living  2     SAB      TAB      Ectopic      Multiple  Live Births              Family History  Problem Relation Age of Onset  . Hypertension Father   . Diabetes Father   . Depression Other   . Uterine cancer Mother        ?cervical cancer  . Uterine cancer Sister        suicide  . Colon cancer Paternal Aunt 54  . Thyroid disease Neg Hx     Social History   Tobacco Use  . Smoking status: Former Smoker    Packs/day: 1.00    Years: 5.00    Pack years: 5.00    Types: Cigarettes    Quit date: 11/03/1997    Years since quitting: 22.6  . Smokeless tobacco: Never Used  . Tobacco comment: 1 pack 1 week  Vaping Use  . Vaping Use: Never used  Substance Use Topics  . Alcohol use: No    Alcohol/week: 0.0 standard drinks  . Drug use:  No    Home Medications Prior to Admission medications   Medication Sig Start Date End Date Taking? Authorizing Provider  acetaminophen (TYLENOL) 500 MG tablet Take 1,000 mg by mouth daily as needed for moderate pain.   Yes [provider]  albuterol (VENTOLIN HFA) 108 (90 Base) MCG/ACT inhaler Inhale 2 puffs into the lungs every 6 (six) hours as needed. 12/13/19  Yes Julian Hy, DO  budesonide-formoterol (SYMBICORT) 160-4.5 MCG/ACT inhaler Inhale 2 puffs into the lungs in the morning and at bedtime. 12/13/19  Yes Julian Hy, DO  Cholecalciferol (VITAMIN D3) 125 MCG (5000 UT) CAPS Take 1 capsule (5,000 Units total) by mouth daily. 10/13/19  Yes Renato Shin, MD  escitalopram (LEXAPRO) 20 MG tablet Take 1 tablet (20 mg total) by mouth at bedtime. 01/26/19  Yes Renato Shin, MD  Magnesium 250 MG TABS Take 250 mg by mouth at bedtime.    Yes [provider]  methylPREDNISolone (MEDROL) 4 MG tablet Take 4 mg by mouth in the morning and at bedtime.   Yes [provider]  montelukast (SINGULAIR) 10 MG tablet Take 1 tablet (10 mg total) by mouth at bedtime. 12/13/19  Yes Noemi Chapel P, DO  NON FORMULARY at bedtime. CPAP at night    Yes [provider]  omeprazole (PRILOSEC) 20 MG capsule Take 20 mg by mouth 2 (two) times daily before a meal.   Yes [provider]  RESTASIS 0.05 % ophthalmic emulsion Place 1 drop into both eyes 2 (two) times daily. 12/26/19  Yes [provider]  simethicone (PHAZYME) 125 MG chewable tablet Chew 125-250 mg by mouth every 6 (six) hours as needed for flatulence.    Yes [provider]  SYNTHROID 150 MCG tablet Take 1 tablet (150 mcg total) by mouth daily before breakfast. 10/04/19  Yes Renato Shin, MD  Tiotropium Bromide Monohydrate (SPIRIVA RESPIMAT) 1.25 MCG/ACT AERS Inhale 2 puffs into the lungs daily. 01/05/20  Yes Noemi Chapel P, DO  Wheat Dextrin (BENEFIBER DRINK MIX PO) Take 1 Dose by mouth 3 (three)  times daily.    Yes [provider]  dicyclomine (BENTYL) 10 MG capsule Take 1 capsule (10 mg total) by mouth 4 (four) times daily -  before meals and at bedtime. As needed to prevent diarrhea and help with abdominal pain. Patient not taking: Reported on 06/24/2020 05/08/20   Mahala Menghini, PA-C  losartan (COZAAR) 25 MG tablet Take 1 tablet (25 mg total) by mouth daily.  Patient not taking: Reported on 06/24/2020 05/31/20 08/29/20  Donato Heinz, MD    Allergies    Levofloxacin, Toradol [ketorolac tromethamine], Other, Chamomile, Hctz [hydrochlorothiazide], Losartan potassium, and Penicillins  Review of Systems   Review of Systems  Constitutional: Negative for appetite change and fatigue.  HENT: Negative for congestion, ear discharge and sinus pressure.   Eyes: Negative for discharge.  Respiratory: Negative for cough.   Cardiovascular: Negative for chest pain.  Gastrointestinal: Negative for abdominal pain and diarrhea.  Genitourinary: Negative for frequency and hematuria.  Musculoskeletal: Negative for back pain.  Skin: Negative for rash.  Neurological: Positive for weakness. Negative for seizures and headaches.  Psychiatric/Behavioral: Negative for hallucinations.    Physical Exam Updated Vital Signs BP 138/81   Pulse 80   Temp 98 F (36.7 C) (Oral)   Resp 18   Ht 5\' 4"  (1.626 m)   Wt 119.7 kg   LMP 10/09/2014   SpO2 100%   BMI 45.32 kg/m   Physical Exam Vitals and nursing note reviewed.  Constitutional:      Appearance: She is well-developed.  HENT:     Head: Normocephalic.     Nose: Nose normal.  Eyes:     General: No scleral icterus.    Conjunctiva/sclera: Conjunctivae normal.  Neck:     Thyroid: No thyromegaly.  Cardiovascular:     Rate and Rhythm: Normal rate and regular rhythm.     Heart sounds: No murmur heard.  No friction rub. No gallop.   Pulmonary:     Breath sounds: No stridor. No wheezing or rales.  Chest:     Chest wall: No  tenderness.  Abdominal:     General: There is no distension.     Tenderness: There is no abdominal tenderness. There is no rebound.  Musculoskeletal:        General: Normal range of motion.     Cervical back: Neck supple.  Lymphadenopathy:     Cervical: No cervical adenopathy.  Skin:    Findings: No erythema or rash.  Neurological:     Mental Status: She is oriented to person, place, and time.     Motor: No abnormal muscle tone.     Coordination: Coordination normal.  Psychiatric:        Behavior: Behavior normal.     ED Results / Procedures / Treatments   Labs (all labs ordered are listed, but only abnormal results are displayed) Labs Reviewed  BASIC METABOLIC PANEL - Abnormal; Notable for the following components:      Result Value   Glucose, Bld 104 (*)    Calcium 8.6 (*)    All other components within normal limits  CBC - Abnormal; Notable for the following components:   WBC 14.2 (*)    All other components within normal limits  URINALYSIS, ROUTINE W REFLEX MICROSCOPIC - Abnormal; Notable for the following components:   Color, Urine STRAW (*)    Specific Gravity, Urine 1.004 (*)    Hgb urine dipstick SMALL (*)    Bacteria, UA RARE (*)    All other components within normal limits  TROPONIN I (HIGH SENSITIVITY)  TROPONIN I (HIGH SENSITIVITY)    EKG EKG Interpretation  Date/Time:  Sunday June 24 2020 13:06:41 EDT Ventricular Rate:  85 PR Interval:  144 QRS Duration: 68 QT Interval:  398 QTC Calculation: 473 R Axis:   59 Text Interpretation: Normal sinus rhythm Cannot rule out Anterior infarct , age undetermined Abnormal ECG Confirmed by Roderic Palau,  Broadus John [phone]) on 06/24/2020 7:01:02 PM   Radiology DG Chest 2 View  Result Date: 06/24/2020 CLINICAL DATA:  Patient with chest pain. EXAM: CHEST - 2 VIEW COMPARISON:  Chest radiograph April 07, 2020 FINDINGS: Normal cardiac and mediastinal contours. No large area pulmonary consolidation. No pleural effusion or  pneumothorax. Thoracic spine degenerative changes. IMPRESSION: No acute cardiopulmonary process. Electronically Signed   By: Lovey Newcomer M.D.   On: 06/24/2020 13:52    Procedures Procedures (including critical care time)  Medications Ordered in ED Medications - No data to display  ED Course  I have reviewed the triage vital signs and the nursing notes.  Pertinent labs & imaging results that were available during my care of the patient were reviewed by me and considered in my medical decision making (see chart for details).    MDM Rules/Calculators/A&P                          Labs unremarkable. Patient's hypokalemia has resolved. She is to follow-up with primary care doctor tomorrow. General weakness unknown cause       This patient presents to the ED for concern of weakness this involves an extensive number of treatment options, and is a complaint that carries with it a high risk of complications and morbidity.  The differential diagnosis includes hypokalemia   Lab Tests:   I Ordered, reviewed, and interpreted labs, which included CBC and chemistries unremarkable except mild elevated white count  Medicines ordered:     Imaging Studies ordered:   I ordered imaging studies which included chest x-ray  I independently visualized and interpreted imaging which showed negative  Additional history obtained:   Additional history obtained from records  Previous records obtained and reviewed.  Consultations Obtained:     Reevaluation:  After the interventions stated above, I reevaluated the patient and found mild improvement  Critical Interventions:  .   Final Clinical Impression(s) / ED Diagnoses Final diagnoses:  Hypokalemia    Rx / DC Orders ED Discharge Orders    None       Milton Ferguson, MD 06/24/20 1911

## 2020-06-24 NOTE — ED Notes (Signed)
Pt provided with food

## 2020-06-24 NOTE — ED Triage Notes (Addendum)
Pt had labs drawn at endocrinologist. Pt states her potassium level was 3.3. Pt states she wanted to be evaluated because she has had chest discomfort, leg cramps, & fatigue.

## 2020-06-24 NOTE — ED Notes (Signed)
After speaking with the MD the patient was adamant that she was going to leave.  She does not wish to wait for paperwork and unhooked herself from the monitor.  She is assisted to the ED lobby where her mother is waiting for her.  She is in no apparent distress and leaves under her own power.

## 2020-06-24 NOTE — Discharge Instructions (Addendum)
Follow up tomorrow with your md as planned

## 2020-06-25 DIAGNOSIS — E876 Hypokalemia: Secondary | ICD-10-CM | POA: Diagnosis not present

## 2020-06-25 DIAGNOSIS — Z6841 Body Mass Index (BMI) 40.0 and over, adult: Secondary | ICD-10-CM | POA: Diagnosis not present

## 2020-06-25 DIAGNOSIS — E063 Autoimmune thyroiditis: Secondary | ICD-10-CM | POA: Diagnosis not present

## 2020-06-25 DIAGNOSIS — I10 Essential (primary) hypertension: Secondary | ICD-10-CM | POA: Diagnosis not present

## 2020-06-25 NOTE — Telephone Encounter (Signed)
Left message returning patient call and concern.  Patient recently hospitalized and advised her to call and follow up as necessary.

## 2020-07-05 DIAGNOSIS — E876 Hypokalemia: Secondary | ICD-10-CM | POA: Diagnosis not present

## 2020-07-05 DIAGNOSIS — Z681 Body mass index (BMI) 19 or less, adult: Secondary | ICD-10-CM | POA: Diagnosis not present

## 2020-07-05 DIAGNOSIS — J329 Chronic sinusitis, unspecified: Secondary | ICD-10-CM | POA: Diagnosis not present

## 2020-07-25 ENCOUNTER — Other Ambulatory Visit: Payer: Self-pay

## 2020-07-25 ENCOUNTER — Ambulatory Visit: Payer: BC Managed Care – PPO | Admitting: Endocrinology

## 2020-07-25 ENCOUNTER — Other Ambulatory Visit (HOSPITAL_COMMUNITY)
Admission: RE | Admit: 2020-07-25 | Discharge: 2020-07-25 | Disposition: A | Discharge status: Home / Self Care | Payer: 59 | Source: Ambulatory Visit | Attending: Gastroenterology | Admitting: Gastroenterology

## 2020-07-25 DIAGNOSIS — R1010 Upper abdominal pain, unspecified: Secondary | ICD-10-CM | POA: Insufficient documentation

## 2020-07-29 DIAGNOSIS — E890 Postprocedural hypothyroidism: Secondary | ICD-10-CM | POA: Insufficient documentation

## 2020-07-29 DIAGNOSIS — Z8639 Personal history of other endocrine, nutritional and metabolic disease: Secondary | ICD-10-CM | POA: Insufficient documentation

## 2020-07-29 DIAGNOSIS — J45909 Unspecified asthma, uncomplicated: Secondary | ICD-10-CM | POA: Insufficient documentation

## 2020-07-30 HISTORY — PX: ESOPHAGOGASTRODUODENOSCOPY: SHX1529

## 2020-09-04 ENCOUNTER — Encounter (HOSPITAL_COMMUNITY): Payer: Self-pay | Admitting: *Deleted

## 2020-09-04 ENCOUNTER — Emergency Department (HOSPITAL_COMMUNITY)
Admission: EM | Admit: 2020-09-04 | Discharge: 2020-09-05 | Disposition: A | Discharge status: Home / Self Care | Payer: PRIVATE HEALTH INSURANCE | Attending: Emergency Medicine | Admitting: Emergency Medicine

## 2020-09-04 ENCOUNTER — Other Ambulatory Visit: Payer: Self-pay

## 2020-09-04 DIAGNOSIS — Z87891 Personal history of nicotine dependence: Secondary | ICD-10-CM | POA: Insufficient documentation

## 2020-09-04 DIAGNOSIS — Z79899 Other long term (current) drug therapy: Secondary | ICD-10-CM | POA: Insufficient documentation

## 2020-09-04 DIAGNOSIS — R519 Headache, unspecified: Secondary | ICD-10-CM

## 2020-09-04 DIAGNOSIS — E039 Hypothyroidism, unspecified: Secondary | ICD-10-CM | POA: Insufficient documentation

## 2020-09-04 DIAGNOSIS — R42 Dizziness and giddiness: Secondary | ICD-10-CM

## 2020-09-04 LAB — CBC WITH DIFFERENTIAL/PLATELET
Abs Immature Granulocytes: 0.04 10*3/uL (ref 0.00–0.07)
Basophils Absolute: 0.1 10*3/uL (ref 0.0–0.1)
Basophils Relative: 0 %
Eosinophils Absolute: 0.1 10*3/uL (ref 0.0–0.5)
Eosinophils Relative: 1 %
HCT: 41.6 % (ref 36.0–46.0)
Hemoglobin: 13.1 g/dL (ref 12.0–15.0)
Immature Granulocytes: 0 %
Lymphocytes Relative: 22 %
Lymphs Abs: 2.5 10*3/uL (ref 0.7–4.0)
MCH: 28.2 pg (ref 26.0–34.0)
MCHC: 31.5 g/dL (ref 30.0–36.0)
MCV: 89.5 fL (ref 80.0–100.0)
Monocytes Absolute: 0.5 10*3/uL (ref 0.1–1.0)
Monocytes Relative: 4 %
Neutro Abs: 8.1 10*3/uL — ABNORMAL HIGH (ref 1.7–7.7)
Neutrophils Relative %: 73 %
Platelets: 330 10*3/uL (ref 150–400)
RBC: 4.65 MIL/uL (ref 3.87–5.11)
RDW: 14.6 % (ref 11.5–15.5)
WBC: 11.2 10*3/uL — ABNORMAL HIGH (ref 4.0–10.5)
nRBC: 0 % (ref 0.0–0.2)

## 2020-09-04 LAB — BASIC METABOLIC PANEL
Anion gap: 10 (ref 5–15)
BUN: 8 mg/dL (ref 6–20)
CO2: 24 mmol/L (ref 22–32)
Calcium: 9.2 mg/dL (ref 8.9–10.3)
Chloride: 102 mmol/L (ref 98–111)
Creatinine, Ser: 0.65 mg/dL (ref 0.44–1.00)
GFR, Estimated: >60 mL/min (ref >60)
Glucose, Bld: 101 mg/dL — ABNORMAL HIGH (ref 70–99)
Potassium: 3.8 mmol/L (ref 3.5–5.1)
Sodium: 136 mmol/L (ref 135–145)

## 2020-09-04 MED ORDER — SODIUM CHLORIDE 0.9 % IV BOLUS
1000.0000 mL | Freq: Once | INTRAVENOUS | Status: AC
  Administered 2020-09-05: 1000 mL via INTRAVENOUS

## 2020-09-04 MED ORDER — ACETAMINOPHEN 325 MG PO TABS
650.0000 mg | ORAL_TABLET | Freq: Once | ORAL | Status: AC
  Administered 2020-09-05: 650 mg via ORAL
  Filled 2020-09-04: qty 2

## 2020-09-04 NOTE — ED Triage Notes (Signed)
Pt with dizziness after waking up from a nap mid day today.  Pt with hx of dizziness.  Pt c/o HA and stools are loose (hx of chronic gastritis)

## 2020-09-05 LAB — TSH: TSH: 2.975 u[IU]/mL (ref 0.350–4.500)

## 2020-09-05 NOTE — ED Provider Notes (Signed)
[facility] EMERGENCY DEPARTMENT Provider Note   CSN: [id number] Arrival date & time: 09/04/20  2100     History Chief Complaint  Patient presents with  . Dizziness    Heather Fry is a 44 y.o. female.  HPI     This a 44 year old female with a history of anxiety, Graves' disease who presents with lightheadedness and headache.  Patient reports that she felt well this morning.  She took her mother-in-law at work and came home and took a nap.  Upon awakening she noted a headache both in the front and occipital region.  Denies worst headache of her life.  Did not take anything for the pain.  Rates her pain at 5 out of 10.  She has had some lightheadedness throughout the day.  She describes it as feeling off balance.  No room spinning dizziness.  She feels she may be dehydrated.  She reports that she is prediabetic and checked her blood pressure sugar and it was in the 160s.  She reports increased thirst during this time.  No recent fevers, cough, chest pain, shortness of breath, abdominal pain.  She does report loose stools.  Past Medical History:  Diagnosis Date  . Anxiety   . Arthritis   . Depression   . Diverticulosis   . GERD (gastroesophageal reflux disease)   . Graves disease   . Internal hemorrhoids   . Thyroid disease   . Uterine fibroid     Patient Active Problem List   Diagnosis Date Noted  . Diarrhea 05/08/2020  . Idiopathic peripheral neuropathy 04/18/2020  . Myelopathy ([place]) 04/18/2020  . OSA on CPAP 04/18/2020  . RLS (restless legs syndrome) 04/18/2020  . Sensory disturbance 01/20/2020  . Atypical chest pain 01/20/2020  . Obesity, Class III, BMI 40-49.9 (morbid obesity) ([place]) 01/20/2020  . Weakness 01/19/2020  . Hyperglycemia 10/04/2019  . Myalgia 09/05/2019  . Vitamin B 12 deficiency 07/28/2019  . Right sided numbness 06/29/2019  . Constipation 06/15/2019  . Elevated lipase 06/15/2019  . Hypomagnesemia 03/29/2019  . Leukocytosis 03/29/2019  .  Abdominal pain 02/10/2019  . Nausea without vomiting 02/10/2019  . Tingling of face 11/30/2018  . Hot flashes 11/30/2018  . Vitamin D deficiency 10/06/2018  . GERD (gastroesophageal reflux disease) 01/28/2017  . Rectal bleeding 01/28/2017  . 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 Hypothyroidism 04/14/2016  . Gastric polyp   . RUQ abdominal pain 09/06/2014  . Excessive or frequent menstruation 01/13/2013  . Depression     Past Surgical History:  Procedure Laterality Date  . ABDOMINAL HYSTERECTOMY     Per patient, uterine fibroids.  . ABDOMINAL HYSTERECTOMY    . CHOLECYSTECTOMY N/A 11/06/2014   Procedure: LAPAROSCOPIC CHOLECYSTECTOMY;  Surgeon: Jamesetta So, MD;  Location: [facility];  Service: General;  Laterality: N/A;  . COLONOSCOPY N/A 03/04/2017   Dr. Gala Romney: Hemorrhoids, diverticulosis, benign polyp without adenomatous changes.  Next colonoscopy 10 years  . DILATION AND CURETTAGE OF UTERUS    . ESOPHAGOGASTRODUODENOSCOPY N/A 10/02/2014   Dr. Gala Romney: fundic gland polyps, hiatal hernia  . MOUTH SURGERY     extraction of teeth  . POLYPECTOMY  03/04/2017   Procedure: POLYPECTOMY;  Surgeon: Daneil Dolin, MD;  Location: [facility];  Service: Endoscopy;;  colon  . THYROIDECTOMY N/A 09/30/2018   Procedure: TOTAL THYROIDECTOMY;  Surgeon: Armandina Gemma, MD;  Location: [facility];  Service: General;  Laterality: N/A;  . TUBAL LIGATION       OB History  Gravida  2   Para  2   Term  2   Preterm      AB      Living  2     SAB      TAB      Ectopic      Multiple      Live Births              Family History  Problem Relation Age of Onset  . Hypertension Father   . Diabetes Father   . Depression Other   . Uterine cancer Mother        ?cervical cancer  . Uterine cancer Sister        suicide  . Colon cancer Paternal Aunt 38  . Thyroid disease Neg Hx     Social History   Tobacco Use  . Smoking status: Former Smoker    Packs/day: 1.00    Years: 5.00    Pack years: 5.00     Types: Cigarettes    Quit date: 11/03/1997    Years since quitting: 22.8  . Smokeless tobacco: Never Used  . Tobacco comment: 1 pack 1 week  Vaping Use  . Vaping Use: Never used  Substance Use Topics  . Alcohol use: No    Alcohol/week: 0.0 standard drinks  . Drug use: No    Home Medications Prior to Admission medications   Medication Sig Start Date End Date Taking? Authorizing Provider  acetaminophen (TYLENOL) 500 MG tablet Take 1,000 mg by mouth daily as needed for moderate pain.    [provider]  albuterol (VENTOLIN HFA) 108 (90 Base) MCG/ACT inhaler Inhale 2 puffs into the lungs every 6 (six) hours as needed. 12/13/19   Julian Hy, DO  budesonide-formoterol (SYMBICORT) 160-4.5 MCG/ACT inhaler Inhale 2 puffs into the lungs in the morning and at bedtime. 12/13/19   Julian Hy, DO  Cholecalciferol (VITAMIN D3) 125 MCG (5000 UT) CAPS Take 1 capsule (5,000 Units total) by mouth daily. 10/13/19   Renato Shin, MD  dicyclomine (BENTYL) 10 MG capsule Take 1 capsule (10 mg total) by mouth 4 (four) times daily -  before meals and at bedtime. As needed to prevent diarrhea and help with abdominal pain. Patient not taking: Reported on 06/24/2020 05/08/20   Mahala Menghini, PA-C  escitalopram (LEXAPRO) 20 MG tablet Take 1 tablet (20 mg total) by mouth at bedtime. 01/26/19   Renato Shin, MD  losartan (COZAAR) 25 MG tablet Take 1 tablet (25 mg total) by mouth daily. Patient not taking: Reported on 06/24/2020 05/31/20 08/29/20  Donato Heinz, MD  Magnesium 250 MG TABS Take 250 mg by mouth at bedtime.     [provider]  methylPREDNISolone (MEDROL) 4 MG tablet Take 4 mg by mouth in the morning and at bedtime.    [provider]  montelukast (SINGULAIR) 10 MG tablet Take 1 tablet (10 mg total) by mouth at bedtime. 12/13/19   Julian Hy, DO  NON FORMULARY at bedtime. CPAP at night     [provider]  omeprazole (PRILOSEC) 20 MG capsule Take 20 mg by  mouth 2 (two) times daily before a meal.    [provider]  RESTASIS 0.05 % ophthalmic emulsion Place 1 drop into both eyes 2 (two) times daily. 12/26/19   [provider]  simethicone (PHAZYME) 125 MG chewable tablet Chew 125-250 mg by mouth every 6 (six) hours as needed for flatulence.  [provider]  SYNTHROID 150 MCG tablet Take 1 tablet (150 mcg total) by mouth daily before breakfast. 10/04/19   Renato Shin, MD  Tiotropium Bromide Monohydrate (SPIRIVA RESPIMAT) 1.25 MCG/ACT AERS Inhale 2 puffs into the lungs daily. 01/05/20   Julian Hy, DO  Wheat Dextrin (BENEFIBER DRINK MIX PO) Take 1 Dose by mouth 3 (three) times daily.     [provider]    Allergies    Levofloxacin, Toradol [ketorolac tromethamine], Other, Chamomile, Hctz [hydrochlorothiazide], Losartan potassium, and Penicillins  Review of Systems   Review of Systems  Constitutional: Negative for fever.  Respiratory: Negative for shortness of breath.   Cardiovascular: Negative for chest pain.  Gastrointestinal: Positive for diarrhea. Negative for abdominal pain, nausea and vomiting.  Genitourinary: Negative for dysuria.  Neurological: Positive for light-headedness and headaches. Negative for dizziness.  All other systems reviewed and are negative.   Physical Exam Updated Vital Signs BP (!) 175/106   Pulse 81   Temp 98.2 F (36.8 C) (Oral)   Resp 13   Ht 1.626 m (5\' 4" )   Wt 121.6 kg   LMP 10/09/2014   SpO2 98%   BMI 46.00 kg/m   Physical Exam Vitals and nursing note reviewed.  Constitutional:      Appearance: She is well-developed. She is obese. She is not ill-appearing.  HENT:     Head: Normocephalic and atraumatic.     Nose: Nose normal.     Mouth/Throat:     Mouth: Mucous membranes are moist.  Eyes:     Pupils: Pupils are equal, round, and reactive to light.  Cardiovascular:     Rate and Rhythm: Normal rate and regular rhythm.     Heart sounds: Normal heart  sounds.  Pulmonary:     Effort: Pulmonary effort is normal. No respiratory distress.     Breath sounds: No wheezing.  Abdominal:     General: Bowel sounds are normal.     Palpations: Abdomen is soft.     Tenderness: There is no abdominal tenderness.  Musculoskeletal:     Cervical back: Neck supple.     Right lower leg: No edema.     Left lower leg: No edema.  Skin:    General: Skin is warm and dry.  Neurological:     Mental Status: She is alert and oriented to person, place, and time.     Comments: Cranial nerves II through XII intact, 5 out of 5 strength in all 4 extremities, no dysmetria to finger-nose-finger  Psychiatric:        Mood and Affect: Mood normal.     ED Results / Procedures / Treatments   Labs (all labs ordered are listed, but only abnormal results are displayed) Labs Reviewed  BASIC METABOLIC PANEL - Abnormal; Notable for the following components:      Result Value   Glucose, Bld 101 (*)    All other components within normal limits  CBC WITH DIFFERENTIAL/PLATELET - Abnormal; Notable for the following components:   WBC 11.2 (*)    Neutro Abs 8.1 (*)    All other components within normal limits  TSH    EKG EKG Interpretation  Date/Time:  Tuesday September 04 2020 23:58:35 EST Ventricular Rate:  86 PR Interval:    QRS Duration: 74 QT Interval:  379 QTC Calculation: 222 R Axis:   54 Text Interpretation: Sinus rhythm Low voltage, precordial leads Baseline wander in lead(s) III V2 Confirmed by Thayer Jew [phone]) on 09/05/2020  12:43:35 AM   Radiology No results found.  Procedures Procedures (including critical care time)  Medications Ordered in ED Medications  sodium chloride 0.9 % bolus 1,000 mL (1,000 mLs Intravenous New Bag/Given (Non-Interop) 09/05/20 0024)  acetaminophen (TYLENOL) tablet 650 mg (650 mg Oral Given 09/05/20 0025)    ED Course  I have reviewed the triage vital signs and the nursing notes.  Pertinent labs & imaging results  that were available during my care of the patient were reviewed by me and considered in my medical decision making (see chart for details).    MDM Rules/Calculators/A&P                          Patient presents with lightheadedness headache.  She is overall nontoxic.  Vital signs notable for blood pressure 135/106.  History of hypertension.  She is nontoxic on exam and neurologic exam is intact.  Considerations include but not limited to, migraine, early systemic illness such as a viral illness, dehydration.  Symptoms not consistent with vertigo.  Labs obtained.  No significant metabolic derangements.  CBC is reassuring and TSH is within normal limits.  Patient was given fluids and Tylenol for her headache.  She reports on repeat exam that her headache is mostly improved but she still "feels bad."  She would not stand for orthostatics.  I discussed with her that this could be an early viral illness although her vital signs are reassuring and she is afebrile.  She declines Covid and flu testing at this time.  Recommend aggressive hydration at home and follow-up closely with her primary doctor.  After history, exam, and medical workup I feel the patient has been appropriately medically screened and is safe for discharge home. Pertinent diagnoses were discussed with the patient. Patient was given return precautions.  Final Clinical Impression(s) / ED Diagnoses Final diagnoses:  Lightheadedness  Nonintractable headache, unspecified chronicity pattern, unspecified headache type    Rx / DC Orders ED Discharge Orders    None       Tayona Sarnowski, Barbette Hair, MD 09/05/20 0200

## 2020-09-05 NOTE — Discharge Instructions (Addendum)
You were seen today for lightheadedness and headache.  Your work-up is reassuring.  You could have an early viral illness although you are not having any fevers or other symptoms.  Make sure that you are staying hydrated at home.

## 2020-09-13 ENCOUNTER — Ambulatory Visit (INDEPENDENT_AMBULATORY_CARE_PROVIDER_SITE_OTHER): Payer: 59 | Admitting: Endocrinology

## 2020-09-13 ENCOUNTER — Other Ambulatory Visit: Payer: Self-pay

## 2020-09-13 ENCOUNTER — Encounter: Payer: Self-pay | Admitting: Endocrinology

## 2020-09-13 VITALS — BP 140/76 | HR 80 | Wt 269.0 lb

## 2020-09-13 DIAGNOSIS — E1369 Other specified diabetes mellitus with other specified complication: Secondary | ICD-10-CM | POA: Diagnosis not present

## 2020-09-13 LAB — POCT GLYCOSYLATED HEMOGLOBIN (HGB A1C): Hemoglobin A1C: 6.3 % — AB (ref 4.0–5.6)

## 2020-09-13 NOTE — Patient Instructions (Addendum)
There are medications that can slow the progression from high blood sugar to diabetes.  However, I hesitate to add any other medication now.    Please continue the same levothyroxine. check your blood sugar once a day.  vary the time of day when you check, between before the 3 meals, and at bedtime.  also check if you have symptoms of your blood sugar being too high or too low.  please keep a record of the readings and bring it to your next appointment here (or you can bring the meter itself).  You can write it on any piece of paper.  please call us sooner if your blood sugar goes below 70, or if it goes over 200. Please come back for a follow-up appointment in 3 months.

## 2020-09-13 NOTE — Progress Notes (Signed)
Subjective:    Patient ID: Heather Fry, female    DOB: 1976-04-25, 44 y.o.   MRN: [id number]  HPI Pt returns for f/u of postsurgical hypothyroidism (she had thyroidectomy 1/20, for hyperthyroidism; she was rx'ed synthroid soon thereafter).   She also has mild postsurgical hypoparathyroidism (she takes vit-D, 5000 units/day).   She also has hypomagnesemia (she takes 250 mg qd).   She also has hyperglycemia.   She has chronic sxs: these have been normal: serum catechols, cortisol, and FSH/LH; she has had TAH but not BSO; Rheumatologist rxs medrol 4 mg qd.   B-12 def: she stopped, due to high level.   Past Medical History:  Diagnosis Date  . Anxiety   . Arthritis   . Depression   . Diverticulosis   . GERD (gastroesophageal reflux disease)   . Graves disease   . Internal hemorrhoids   . Thyroid disease   . Uterine fibroid     Past Surgical History:  Procedure Laterality Date  . ABDOMINAL HYSTERECTOMY     Per patient, uterine fibroids.  . ABDOMINAL HYSTERECTOMY    . CHOLECYSTECTOMY N/A 11/06/2014   Procedure: LAPAROSCOPIC CHOLECYSTECTOMY;  Surgeon: Jamesetta So, MD;  Location: [facility];  Service: General;  Laterality: N/A;  . COLONOSCOPY N/A 03/04/2017   Dr. Gala Romney: Hemorrhoids, diverticulosis, benign polyp without adenomatous changes.  Next colonoscopy 10 years  . DILATION AND CURETTAGE OF UTERUS    . ESOPHAGOGASTRODUODENOSCOPY N/A 10/02/2014   Dr. Gala Romney: fundic gland polyps, hiatal hernia  . MOUTH SURGERY     extraction of teeth  . POLYPECTOMY  03/04/2017   Procedure: POLYPECTOMY;  Surgeon: Daneil Dolin, MD;  Location: [facility];  Service: Endoscopy;;  colon  . THYROIDECTOMY N/A 09/30/2018   Procedure: TOTAL THYROIDECTOMY;  Surgeon: Armandina Gemma, MD;  Location: [facility];  Service: General;  Laterality: N/A;  . TUBAL LIGATION      Social History   Socioeconomic History  . Marital status: Married    Spouse name: Not on file  . Number of children: Not on file  . Years of  education: Not on file  . Highest education level: Not on file  Occupational History  . Not on file  Tobacco Use  . Smoking status: Former Smoker    Packs/day: 1.00    Years: 5.00    Pack years: 5.00    Types: Cigarettes    Quit date: 11/03/1997    Years since quitting: 22.8  . Smokeless tobacco: Never Used  . Tobacco comment: 1 pack 1 week  Vaping Use  . Vaping Use: Never used  Substance and Sexual Activity  . Alcohol use: No    Alcohol/week: 0.0 standard drinks  . Drug use: No  . Sexual activity: Yes    Birth control/protection: Surgical  Other Topics Concern  . Not on file  Social History Narrative  . Not on file   Social Determinants of Health   Financial Resource Strain: Not on file  Food Insecurity: Not on file  Transportation Needs: Not on file  Physical Activity: Not on file  Stress: Not on file  Social Connections: Not on file  Intimate Partner Violence: Not on file    Current Outpatient Medications on File Prior to Visit  Medication Sig Dispense Refill  . acetaminophen (TYLENOL) 500 MG tablet Take 1,000 mg by mouth daily as needed for moderate pain.    Marland Kitchen albuterol (VENTOLIN HFA) 108 (90 Base) MCG/ACT inhaler Inhale 2 puffs into the  lungs every 6 (six) hours as needed. 18 g 11  . budesonide-formoterol (SYMBICORT) 160-4.5 MCG/ACT inhaler Inhale 2 puffs into the lungs in the morning and at bedtime. 1 Inhaler 11  . Cholecalciferol (VITAMIN D3) 125 MCG (5000 UT) CAPS Take 1 capsule (5,000 Units total) by mouth daily. 30 capsule 11  . escitalopram (LEXAPRO) 20 MG tablet Take 1 tablet (20 mg total) by mouth at bedtime. 30 tablet 5  . Magnesium 250 MG TABS Take 250 mg by mouth at bedtime.     . methylPREDNISolone (MEDROL) 4 MG tablet Take 4 mg by mouth in the morning and at bedtime.    . montelukast (SINGULAIR) 10 MG tablet Take 1 tablet (10 mg total) by mouth at bedtime. 30 tablet 11  . NON FORMULARY at bedtime. CPAP at [facility]    . omeprazole (PRILOSEC) 20 MG capsule  Take 20 mg by mouth 2 (two) times daily before a meal.    . RESTASIS 0.05 % ophthalmic emulsion Place 1 drop into both eyes 2 (two) times daily.    . simethicone (MYLICON) 161 MG chewable tablet Chew 125-250 mg by mouth every 6 (six) hours as needed for flatulence.     Marland Kitchen SYNTHROID 150 MCG tablet Take 1 tablet (150 mcg total) by mouth daily before breakfast. 90 tablet 3  . Tiotropium Bromide Monohydrate (SPIRIVA RESPIMAT) 1.25 MCG/ACT AERS Inhale 2 puffs into the lungs daily. 4 g 11  . Wheat Dextrin (BENEFIBER DRINK MIX PO) Take 1 Dose by mouth 3 (three) times daily.     Marland Kitchen losartan (COZAAR) 25 MG tablet Take 1 tablet (25 mg total) by mouth daily. (Patient not taking: No sig reported) 90 tablet 3   No current facility-administered medications on file prior to visit.    Allergies  Allergen Reactions  . Levofloxacin Other (See Comments)    Pt can not have due to taking Lexapro   . Toradol [Ketorolac Tromethamine] Anaphylaxis and Hives  . Other Other (See Comments)  . Chamomile   . Hctz [Hydrochlorothiazide] Other (See Comments)    Dehydration  . Losartan Potassium   . Penicillins Other (See Comments)    Loopy, childhood allergy      Family History  Problem Relation Age of Onset  . Hypertension Father   . Diabetes Father   . Depression Other   . Uterine cancer Mother        ?cervical cancer  . Uterine cancer Sister        suicide  . Colon cancer Paternal Aunt 39  . Thyroid disease Neg Hx     BP 140/76   Pulse 80   Wt 269 lb (122 kg)   LMP 10/09/2014   SpO2 99%   BMI 46.17 kg/m    Review of Systems     Objective:   Physical Exam VITAL SIGNS:  See vs page GENERAL: no distress.  In wheelchair.   Neck: a healed scar is present.  I do not appreciate a nodule in the thyroid or elsewhere in the neck.    A1c=6.3%  Lab Results  Component Value Date   CREATININE 0.65 09/04/2020   BUN 8 09/04/2020   NA 136 09/04/2020   K 3.8 09/04/2020   CL 102 09/04/2020   CO2 24  09/04/2020     Lab Results  Component Value Date   CREATININE 0.65 09/04/2020   BUN 8 09/04/2020   NA 136 09/04/2020   K 3.8 09/04/2020   CL 102 09/04/2020  CO2 24 09/04/2020   Lab Results  Component Value Date   TSH 2.975 09/04/2020       Assessment & Plan:  Hyperglycemia: worse.  We discussed natural hx of this disease.  Hypothyroidism: well-replaced.   Patient Instructions  There are medications that can slow the progression from high blood sugar to diabetes.  However, I hesitate to add any other medication now.    Please continue the same levothyroxine. check your blood sugar once a day.  vary the time of day when you check, between before the 3 meals, and at bedtime.  also check if you have symptoms of your blood sugar being too high or too low.  please keep a record of the readings and bring it to your next appointment here (or you can bring the meter itself).  You can write it on any piece of paper.  please call us sooner if your blood sugar goes below 70, or if it goes over 200. Please come back for a follow-up appointment in 3 months.

## 2020-09-24 ENCOUNTER — Other Ambulatory Visit: Payer: Self-pay | Admitting: Endocrinology

## 2020-10-01 ENCOUNTER — Ambulatory Visit: Payer: BC Managed Care – PPO | Admitting: Endocrinology

## 2020-10-19 ENCOUNTER — Other Ambulatory Visit: Payer: Self-pay

## 2020-10-19 ENCOUNTER — Emergency Department (HOSPITAL_COMMUNITY)
Admission: EM | Admit: 2020-10-19 | Discharge: 2020-10-19 | Disposition: A | Discharge status: Home / Self Care | Payer: 59

## 2020-12-03 ENCOUNTER — Ambulatory Visit: Payer: BC Managed Care – PPO | Admitting: Cardiology

## 2020-12-12 ENCOUNTER — Encounter (HOSPITAL_COMMUNITY): Payer: Self-pay

## 2020-12-12 ENCOUNTER — Emergency Department (HOSPITAL_COMMUNITY)
Admission: EM | Admit: 2020-12-12 | Discharge: 2020-12-13 | Disposition: A | Discharge status: Home / Self Care | Payer: 59 | Attending: Emergency Medicine | Admitting: Emergency Medicine

## 2020-12-12 ENCOUNTER — Other Ambulatory Visit: Payer: Self-pay

## 2020-12-12 DIAGNOSIS — Z79899 Other long term (current) drug therapy: Secondary | ICD-10-CM | POA: Diagnosis not present

## 2020-12-12 DIAGNOSIS — R739 Hyperglycemia, unspecified: Secondary | ICD-10-CM | POA: Diagnosis not present

## 2020-12-12 DIAGNOSIS — R42 Dizziness and giddiness: Secondary | ICD-10-CM | POA: Diagnosis present

## 2020-12-12 DIAGNOSIS — E039 Hypothyroidism, unspecified: Secondary | ICD-10-CM

## 2020-12-12 DIAGNOSIS — Z87891 Personal history of nicotine dependence: Secondary | ICD-10-CM | POA: Insufficient documentation

## 2020-12-12 NOTE — ED Provider Notes (Signed)
[facility] EMERGENCY DEPARTMENT Provider Note   CSN: [id number] Arrival date & time: 12/12/20  2030     History Chief Complaint  Patient presents with  . Dizziness    Heather Fry is a 45 y.o. female.  Patient presents to the emergency department for evaluation of fatigue, intermittent diaphoresis, increased thirst with intermittent episodes of dizziness.  Patient reports that symptoms have been present since she got Kenalog injections in her knees.  No associated headache or blurred vision.  No chest pain or shortness of breath.  She has had some nasal congestion and eye itching but attributes that to her normal seasonal allergies.  No fever.        Past Medical History:  Diagnosis Date  . Anxiety   . Arthritis   . Depression   . Diverticulosis   . GERD (gastroesophageal reflux disease)   . Graves disease   . Internal hemorrhoids   . Thyroid disease   . Uterine fibroid     Patient Active Problem List   Diagnosis Date Noted  . Diarrhea 05/08/2020  . Idiopathic peripheral neuropathy 04/18/2020  . 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 OSA on CPAP 04/18/2020  . RLS (restless legs syndrome) 04/18/2020  . Sensory disturbance 01/20/2020  . Atypical chest pain 01/20/2020  . Obesity, Class III, BMI 40-49.9 (morbid obesity) ([place]) 01/20/2020  . Weakness 01/19/2020  . Hyperglycemia 10/04/2019  . Myalgia 09/05/2019  . Vitamin B 12 deficiency 07/28/2019  . Right sided numbness 06/29/2019  . Constipation 06/15/2019  . Elevated lipase 06/15/2019  . Hypomagnesemia 03/29/2019  . Leukocytosis 03/29/2019  . Abdominal pain 02/10/2019  . Nausea without vomiting 02/10/2019  . Tingling of face 11/30/2018  . Hot flashes 11/30/2018  . Vitamin D deficiency 10/06/2018  . GERD (gastroesophageal reflux disease) 01/28/2017  . Rectal bleeding 01/28/2017  . 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 Hypothyroidism 04/14/2016  . Gastric polyp   . RUQ abdominal pain 09/06/2014  . Excessive or  frequent menstruation 01/13/2013  . Depression     Past Surgical History:  Procedure Laterality Date  . ABDOMINAL HYSTERECTOMY     Per patient, uterine fibroids.  . ABDOMINAL HYSTERECTOMY    . CHOLECYSTECTOMY N/A 11/06/2014   Procedure: LAPAROSCOPIC CHOLECYSTECTOMY;  Surgeon: Jamesetta So, MD;  Location: [facility];  Service: General;  Laterality: N/A;  . COLONOSCOPY N/A 03/04/2017   Dr. Gala Romney: Hemorrhoids, diverticulosis, benign polyp without adenomatous changes.  Next colonoscopy 10 years  . DILATION AND CURETTAGE OF UTERUS    . ESOPHAGOGASTRODUODENOSCOPY N/A 10/02/2014   Dr. Gala Romney: fundic gland polyps, hiatal hernia  . MOUTH SURGERY     extraction of teeth  . POLYPECTOMY  03/04/2017   Procedure: POLYPECTOMY;  Surgeon: Daneil Dolin, MD;  Location: [facility];  Service: Endoscopy;;  colon  . THYROIDECTOMY N/A 09/30/2018   Procedure: TOTAL THYROIDECTOMY;  Surgeon: Armandina Gemma, MD;  Location: [facility];  Service: General;  Laterality: N/A;  . TUBAL LIGATION       OB History    Gravida  2   Para  2   Term  2   Preterm      AB      Living  2     SAB      IAB      Ectopic      Multiple      Live Births              Family History  Problem Relation Age of  Onset  . Hypertension Father   . Diabetes Father   . Depression Other   . Uterine cancer Mother        ?cervical cancer  . Uterine cancer Sister        suicide  . Colon cancer Paternal Aunt 61  . Thyroid disease Neg Hx     Social History   Tobacco Use  . Smoking status: Former Smoker    Packs/day: 1.00    Years: 5.00    Pack years: 5.00    Types: Cigarettes    Quit date: 11/03/1997    Years since quitting: 23.1  . Smokeless tobacco: Never Used  . Tobacco comment: 1 pack 1 week  Vaping Use  . Vaping Use: Never used  Substance Use Topics  . Alcohol use: No    Alcohol/week: 0.0 standard drinks  . Drug use: No    Home Medications Prior to Admission medications   Medication Sig Start Date End Date  Taking? Authorizing Provider  acetaminophen (TYLENOL) 500 MG tablet Take 1,000 mg by mouth daily as needed for moderate pain.    [provider]  albuterol (VENTOLIN HFA) 108 (90 Base) MCG/ACT inhaler Inhale 2 puffs into the lungs every 6 (six) hours as needed. 12/13/19   Julian Hy, DO  budesonide-formoterol (SYMBICORT) 160-4.5 MCG/ACT inhaler Inhale 2 puffs into the lungs in the morning and at bedtime. 12/13/19   Julian Hy, DO  Cholecalciferol (VITAMIN D3) 125 MCG (5000 UT) CAPS Take 1 capsule (5,000 Units total) by mouth daily. 10/13/19   Renato Shin, MD  escitalopram (LEXAPRO) 20 MG tablet Take 1 tablet (20 mg total) by mouth at bedtime. 01/26/19   Renato Shin, MD  levothyroxine (SYNTHROID) 150 MCG tablet Take 150 mcg by mouth daily before breakfast.    [provider]  losartan (COZAAR) 25 MG tablet Take 1 tablet (25 mg total) by mouth daily. Patient not taking: No sig reported 05/31/20 08/29/20  Donato Heinz, MD  Magnesium 250 MG TABS Take 250 mg by mouth at bedtime.     [provider]  methylPREDNISolone (MEDROL) 4 MG tablet Take 4 mg by mouth in the morning and at bedtime.    [provider]  montelukast (SINGULAIR) 10 MG tablet Take 1 tablet (10 mg total) by mouth at bedtime. 12/13/19   Julian Hy, DO  NON FORMULARY at bedtime. CPAP at night    [provider]  omeprazole (PRILOSEC) 20 MG capsule Take 20 mg by mouth 2 (two) times daily before a meal.    [provider]  RESTASIS 0.05 % ophthalmic emulsion Place 1 drop into both eyes 2 (two) times daily. 12/26/19   [provider]  simethicone (MYLICON) 811 MG chewable tablet Chew 125-250 mg by mouth every 6 (six) hours as needed for flatulence.     [provider]  SYNTHROID 150 MCG tablet TAKE 1 TABLET BY MOUTH ONCE DAILY BEFORE BREAKFAST 09/24/20   Renato Shin, MD  Tiotropium Bromide Monohydrate (SPIRIVA RESPIMAT) 1.25 MCG/ACT AERS Inhale 2 puffs  into the lungs daily. 01/05/20   Julian Hy, DO  Wheat Dextrin (BENEFIBER DRINK MIX PO) Take 1 Dose by mouth 3 (three) times daily.     [provider]    Allergies    Levofloxacin, Toradol [ketorolac tromethamine], Other, Chamomile, Hctz [hydrochlorothiazide], Losartan potassium, and Penicillins  Review of Systems   Review of Systems  Constitutional: Positive for diaphoresis and fatigue.  Neurological: Positive for dizziness.  All other systems reviewed and are negative.   Physical Exam Updated Vital Signs BP (!) 164/98 (BP Location: Right Arm)   Pulse 97   Temp 98.1 F (36.7 C) (Oral)   Resp 20   Ht 5\' 4"  (1.626 m)   Wt 121.6 kg   LMP 10/09/2014   SpO2 100%   BMI 46.00 kg/m   Physical Exam Vitals and nursing note reviewed.  Constitutional:      General: She is not in acute distress.    Appearance: Normal appearance. She is well-developed.  HENT:     Head: Normocephalic and atraumatic.     Right Ear: Hearing normal.     Left Ear: Hearing normal.     Nose: Nose normal.  Eyes:     Conjunctiva/sclera: Conjunctivae normal.     Pupils: Pupils are equal, round, and reactive to light.  Cardiovascular:     Rate and Rhythm: Regular rhythm.     Heart sounds: S1 normal and S2 normal. No murmur heard. No friction rub. No gallop.   Pulmonary:     Effort: Pulmonary effort is normal. No respiratory distress.     Breath sounds: Normal breath sounds.  Chest:     Chest wall: No tenderness.  Abdominal:     General: Bowel sounds are normal.     Palpations: Abdomen is soft.     Tenderness: There is no abdominal tenderness. There is no guarding or rebound. Negative signs include Murphy's sign and McBurney's sign.     Hernia: No hernia is present.  Musculoskeletal:        General: Normal range of motion.     Cervical back: Normal range of motion and neck supple.  Skin:    General: Skin is warm and dry.     Findings: No rash.  Neurological:     Mental Status: She is  alert and oriented to person, place, and time.     GCS: GCS eye subscore is 4. GCS verbal subscore is 5. GCS motor subscore is 6.     Cranial Nerves: No cranial nerve deficit.     Sensory: No sensory deficit.     Coordination: Coordination normal.  Psychiatric:        Speech: Speech normal.        Behavior: Behavior normal.        Thought Content: Thought content normal.     ED Results / Procedures / Treatments   Labs (all labs ordered are listed, but only abnormal results are displayed) Labs Reviewed  TSH - Abnormal; Notable for the following components:      Result Value   TSH 4.529 (*)    All other components within normal limits  COMPREHENSIVE METABOLIC PANEL - Abnormal; Notable for the following components:   Glucose, Bld 105 (*)    All other components within normal limits  CBC - Abnormal; Notable for the following components:   WBC 14.3 (*)    RDW 17.0 (*)    All other components within normal limits  HEMOGLOBIN A1C    EKG None  Radiology No results found.  Procedures Procedures   Medications Ordered in ED Medications - No data to display  ED Course  I have reviewed the triage vital signs and the nursing notes.  Pertinent labs & imaging results that were available during my care of the patient were reviewed by me and considered in my medical decision making (see chart for details).    MDM Rules/Calculators/A&P  Patient presents to the emergency department with generalized malaise, hot flashes, increased sweating, dizziness and weakness.  Her examination currently is normal.  Vital signs are unremarkable other than hypertension which is chronic.  Patient presents lab work from previous ER visit where she was mildly hypokalemic.  This was rechecked and she has no normal, has been taking supplementation.  Patient with a history of thyroid disease, TSH is very slightly elevated, not likely the cause of her symptoms.  Patient's blood sugar  is mildly elevated.  She reports that she is "prediabetic".  She has follow-up scheduled with her endocrinologist in a week.  Can review the A1c and TSH at that time.  Patient appears well, does not require any further work-up at this time.  Final Clinical Impression(s) / ED Diagnoses Final diagnoses:  Dizziness  Hypothyroidism, unspecified type  Hyperglycemia    Rx / DC Orders ED Discharge Orders    None       Pollina, Gwenyth Allegra, MD 12/13/20 0124

## 2020-12-12 NOTE — ED Triage Notes (Signed)
Pt to er, pt states that she is here for some dizziness and weakness, states that she got as steroid injection last  Wednesday, states that since then she wasn't feeling well, states that she went to the hospital in [place] and they did some blood work and she was dc, pt states that talked with the pmd and was told to take some potassium.

## 2020-12-13 DIAGNOSIS — R42 Dizziness and giddiness: Secondary | ICD-10-CM | POA: Diagnosis not present

## 2020-12-13 LAB — COMPREHENSIVE METABOLIC PANEL
ALT: 28 U/L (ref 0–44)
AST: 24 U/L (ref 15–41)
Albumin: 3.8 g/dL (ref 3.5–5.0)
Alkaline Phosphatase: 42 U/L (ref 38–126)
Anion gap: 11 (ref 5–15)
BUN: 10 mg/dL (ref 6–20)
CO2: 25 mmol/L (ref 22–32)
Calcium: 9.4 mg/dL (ref 8.9–10.3)
Chloride: 102 mmol/L (ref 98–111)
Creatinine, Ser: 0.63 mg/dL (ref 0.44–1.00)
GFR, Estimated: >60 mL/min (ref >60)
Glucose, Bld: 105 mg/dL — ABNORMAL HIGH (ref 70–99)
Potassium: 4 mmol/L (ref 3.5–5.1)
Sodium: 138 mmol/L (ref 135–145)
Total Bilirubin: 0.5 mg/dL (ref 0.3–1.2)
Total Protein: 7.9 g/dL (ref 6.5–8.1)

## 2020-12-13 LAB — TSH: TSH: 4.529 u[IU]/mL — ABNORMAL HIGH (ref 0.350–4.500)

## 2020-12-13 LAB — CBC
HCT: 41.9 % (ref 36.0–46.0)
Hemoglobin: 13.4 g/dL (ref 12.0–15.0)
MCH: 28.2 pg (ref 26.0–34.0)
MCHC: 32 g/dL (ref 30.0–36.0)
MCV: 88 fL (ref 80.0–100.0)
Platelets: 327 10*3/uL (ref 150–400)
RBC: 4.76 MIL/uL (ref 3.87–5.11)
RDW: 17 % — ABNORMAL HIGH (ref 11.5–15.5)
WBC: 14.3 10*3/uL — ABNORMAL HIGH (ref 4.0–10.5)
nRBC: 0 % (ref 0.0–0.2)

## 2020-12-13 LAB — HEMOGLOBIN A1C
Hgb A1c MFr Bld: 6.6 % — ABNORMAL HIGH (ref 4.8–5.6)
Mean Plasma Glucose: 142.72 mg/dL

## 2020-12-20 ENCOUNTER — Ambulatory Visit (INDEPENDENT_AMBULATORY_CARE_PROVIDER_SITE_OTHER): Payer: 59 | Admitting: Endocrinology

## 2020-12-20 ENCOUNTER — Other Ambulatory Visit: Payer: Self-pay

## 2020-12-20 ENCOUNTER — Encounter: Payer: Self-pay | Admitting: Endocrinology

## 2020-12-20 VITALS — BP 134/80 | HR 88 | Resp 20 | Ht 64.0 in | Wt 267.2 lb

## 2020-12-20 DIAGNOSIS — E559 Vitamin D deficiency, unspecified: Secondary | ICD-10-CM | POA: Diagnosis not present

## 2020-12-20 DIAGNOSIS — R635 Abnormal weight gain: Secondary | ICD-10-CM | POA: Insufficient documentation

## 2020-12-20 DIAGNOSIS — E538 Deficiency of other specified B group vitamins: Secondary | ICD-10-CM

## 2020-12-20 MED ORDER — LEVOTHYROXINE SODIUM 175 MCG PO TABS: 175.0000 ug | ORAL_TABLET | Freq: Every day | ORAL | 3 refills | Status: DC

## 2020-12-20 NOTE — Progress Notes (Signed)
Subjective:    Patient ID: Heather Fry, female    DOB: October 04, 1975, 45 y.o.   MRN: [id number]  HPI Pt returns for f/u of postsurgical hypothyroidism (she had thyroidectomy 1/20, for hyperthyroidism; she was rx'ed synthroid soon thereafter).  Synthroid was increased last week She also has mild postsurgical hypoparathyroidism (she takes vit-D, 5000 units/day).   She also has hypomagnesemia (she takes 250 mg qd).   She has chronic sxs: these have been normal: serum catechols, cortisol, and FSH/LH; she has had TAH but not BSO.    B-12 def: she stopped, due to high level.  Type 2 DM: she has never been on medication for this.  she says cbg's are in the mid-100's.  She was seen for knee pain.  She got steroid injections 2 weeks ago.   Past Medical History:  Diagnosis Date  . Anxiety   . Arthritis   . Depression   . Diverticulosis   . GERD (gastroesophageal reflux disease)   . Graves disease   . Internal hemorrhoids   . Thyroid disease   . Uterine fibroid     Past Surgical History:  Procedure Laterality Date  . ABDOMINAL HYSTERECTOMY     Per patient, uterine fibroids.  . ABDOMINAL HYSTERECTOMY    . CHOLECYSTECTOMY N/A 11/06/2014   Procedure: LAPAROSCOPIC CHOLECYSTECTOMY;  Surgeon: Jamesetta So, MD;  Location: [facility];  Service: General;  Laterality: N/A;  . COLONOSCOPY N/A 03/04/2017   Dr. Gala Romney: Hemorrhoids, diverticulosis, benign polyp without adenomatous changes.  Next colonoscopy 10 years  . DILATION AND CURETTAGE OF UTERUS    . ESOPHAGOGASTRODUODENOSCOPY N/A 10/02/2014   Dr. Gala Romney: fundic gland polyps, hiatal hernia  . MOUTH SURGERY     extraction of teeth  . POLYPECTOMY  03/04/2017   Procedure: POLYPECTOMY;  Surgeon: Daneil Dolin, MD;  Location: [facility];  Service: Endoscopy;;  colon  . THYROIDECTOMY N/A 09/30/2018   Procedure: TOTAL THYROIDECTOMY;  Surgeon: Armandina Gemma, MD;  Location: [facility];  Service: General;  Laterality: N/A;  . TUBAL LIGATION      Social History    Socioeconomic History  . Marital status: Married    Spouse name: Not on file  . Number of children: Not on file  . Years of education: Not on file  . Highest education level: Not on file  Occupational History  . Not on file  Tobacco Use  . Smoking status: Former Smoker    Packs/day: 1.00    Years: 5.00    Pack years: 5.00    Types: Cigarettes    Quit date: 11/03/1997    Years since quitting: 23.1  . Smokeless tobacco: Never Used  . Tobacco comment: 1 pack 1 week  Vaping Use  . Vaping Use: Never used  Substance and Sexual Activity  . Alcohol use: No    Alcohol/week: 0.0 standard drinks  . Drug use: No  . Sexual activity: Yes    Birth control/protection: Surgical  Other Topics Concern  . Not on file  Social History Narrative  . Not on file   Social Determinants of Health   Financial Resource Strain: Not on file  Food Insecurity: Not on file  Transportation Needs: Not on file  Physical Activity: Not on file  Stress: Not on file  Social Connections: Not on file  Intimate Partner Violence: Not on file    Current Outpatient Medications on File Prior to Visit  Medication Sig Dispense Refill  . acetaminophen (TYLENOL) 500 MG tablet  Take 1,000 mg by mouth daily as needed for moderate pain.    Marland Kitchen albuterol (VENTOLIN HFA) 108 (90 Base) MCG/ACT inhaler Inhale 2 puffs into the lungs every 6 (six) hours as needed. 18 g 11  . budesonide-formoterol (SYMBICORT) 160-4.5 MCG/ACT inhaler Inhale 2 puffs into the lungs in the morning and at bedtime. 1 Inhaler 11  . Cholecalciferol (VITAMIN D3) 125 MCG (5000 UT) CAPS Take 1 capsule (5,000 Units total) by mouth daily. 30 capsule 11  . escitalopram (LEXAPRO) 20 MG tablet Take 1 tablet (20 mg total) by mouth at bedtime. 30 tablet 5  . Magnesium 250 MG TABS Take 250 mg by mouth at bedtime.     . montelukast (SINGULAIR) 10 MG tablet Take 1 tablet (10 mg total) by mouth at bedtime. 30 tablet 11  . omeprazole (PRILOSEC) 20 MG capsule Take 20 mg  by mouth 2 (two) times daily before a meal.    . RESTASIS 0.05 % ophthalmic emulsion Place 1 drop into both eyes 2 (two) times daily.    . simethicone (MYLICON) 287 MG chewable tablet Chew 125-250 mg by mouth every 6 (six) hours as needed for flatulence.     . Tiotropium Bromide Monohydrate (SPIRIVA RESPIMAT) 1.25 MCG/ACT AERS Inhale 2 puffs into the lungs daily. 4 g 11  . Wheat Dextrin (BENEFIBER DRINK MIX PO) Take 1 Dose by mouth 3 (three) times daily.     Marland Kitchen losartan (COZAAR) 25 MG tablet Take 1 tablet (25 mg total) by mouth daily. (Patient not taking: No sig reported) 90 tablet 3  . NON FORMULARY at bedtime. CPAP at [facility]     No current facility-administered medications on file prior to visit.    Allergies  Allergen Reactions  . Levofloxacin Other (See Comments)    Pt can not have due to taking Lexapro   . Toradol [Ketorolac Tromethamine] Anaphylaxis and Hives  . Other Other (See Comments)  . Chamomile   . Hctz [Hydrochlorothiazide] Other (See Comments)    Dehydration  . Losartan Potassium   . Penicillins Other (See Comments)    Loopy, childhood allergy      Family History  Problem Relation Age of Onset  . Hypertension Father   . Diabetes Father   . Depression Other   . Uterine cancer Mother        ?cervical cancer  . Uterine cancer Sister        suicide  . Colon cancer Paternal Aunt 85  . Thyroid disease Neg Hx     BP 134/80 (BP Location: Left Arm, Patient Position: Sitting, Cuff Size: Large)   Pulse 88   Resp 20   Ht 5\' 4"  (1.626 m)   Wt 267 lb 3.2 oz (121.2 kg)   LMP 10/09/2014   SpO2 98%   BMI 45.86 kg/m    Review of Systems She reports weight gain.    Objective:   Physical Exam VITAL SIGNS:  See vs page GENERAL: no distress.  In WC Pulses: dorsalis pedis intact bilat.   MSK: no deformity of the feet CV: no leg edema Skin:  no ulcer on the feet.  normal color and temp on the feet. Neuro: sensation is intact to touch on the feet Ext: there is  bilateral onychomycosis of the toenails   Lab Results  Component Value Date   HGBA1C 6.6 (H) 12/13/2020   Lab Results  Component Value Date   CREATININE 0.63 12/13/2020   BUN 10 12/13/2020   NA 138 12/13/2020  K 4.0 12/13/2020   CL 102 12/13/2020   CO2 25 12/13/2020   Lab Results  Component Value Date   PTH 31 05/18/2020   PTH Comment 05/18/2020   CALCIUM 9.4 12/13/2020   CAION 1.25 01/19/2020   PHOS 3.3 05/18/2020   25-OH Vit-D=66    Assessment & Plan:  Type 2 DM, exac by steroid injections.  We'll follow off medication for now Vit-D def: well-controlled.  Please continue the same ergocalciferol Obesity: worse.  Patient Instructions  Blood tests are requested for you today.  We'll let you know about the results.  I have sent a prescription to your pharmacy, to refill the thyroid pill.   Please see a weight loss specialist.  you will receive a phone call, about a day and time for an appointment. Please come back for a follow-up appointment in 2 months.

## 2020-12-20 NOTE — Patient Instructions (Addendum)
Blood tests are requested for you today.  We'll let you know about the results.  I have sent a prescription to your pharmacy, to refill the thyroid pill.   Please see a weight loss specialist.  you will receive a phone call, about a day and time for an appointment. Please come back for a follow-up appointment in 2 months.

## 2020-12-22 ENCOUNTER — Other Ambulatory Visit: Payer: Self-pay | Admitting: Critical Care Medicine

## 2020-12-22 LAB — MAGNESIUM: Magnesium: 2.1 mg/dL (ref 1.6–2.3)

## 2020-12-22 LAB — VITAMIN B12: Vitamin B-12: 663 pg/mL (ref 232–1245)

## 2020-12-22 LAB — VITAMIN D 25 HYDROXY (VIT D DEFICIENCY, FRACTURES): Vit D, 25-Hydroxy: 66.1 ng/mL (ref 30.0–100.0)

## 2020-12-25 ENCOUNTER — Emergency Department (HOSPITAL_COMMUNITY)
Admission: EM | Admit: 2020-12-25 | Discharge: 2020-12-25 | Disposition: A | Discharge status: Home / Self Care | Payer: 59 | Attending: Emergency Medicine | Admitting: Emergency Medicine

## 2020-12-25 ENCOUNTER — Encounter (HOSPITAL_COMMUNITY): Payer: Self-pay | Admitting: *Deleted

## 2020-12-25 ENCOUNTER — Other Ambulatory Visit: Payer: Self-pay

## 2020-12-25 DIAGNOSIS — R1011 Right upper quadrant pain: Secondary | ICD-10-CM | POA: Insufficient documentation

## 2020-12-25 DIAGNOSIS — R1013 Epigastric pain: Secondary | ICD-10-CM | POA: Diagnosis not present

## 2020-12-25 DIAGNOSIS — E039 Hypothyroidism, unspecified: Secondary | ICD-10-CM | POA: Insufficient documentation

## 2020-12-25 DIAGNOSIS — R110 Nausea: Secondary | ICD-10-CM | POA: Insufficient documentation

## 2020-12-25 DIAGNOSIS — Z79899 Other long term (current) drug therapy: Secondary | ICD-10-CM | POA: Diagnosis not present

## 2020-12-25 DIAGNOSIS — Z87891 Personal history of nicotine dependence: Secondary | ICD-10-CM | POA: Insufficient documentation

## 2020-12-25 DIAGNOSIS — K219 Gastro-esophageal reflux disease without esophagitis: Secondary | ICD-10-CM | POA: Insufficient documentation

## 2020-12-25 LAB — CBC WITH DIFFERENTIAL/PLATELET
Abs Immature Granulocytes: 0.05 10*3/uL (ref 0.00–0.07)
Basophils Absolute: 0 10*3/uL (ref 0.0–0.1)
Basophils Relative: 0 %
Eosinophils Absolute: 0.1 10*3/uL (ref 0.0–0.5)
Eosinophils Relative: 1 %
HCT: 40.4 % (ref 36.0–46.0)
Hemoglobin: 13 g/dL (ref 12.0–15.0)
Immature Granulocytes: 0 %
Lymphocytes Relative: 17 %
Lymphs Abs: 2 10*3/uL (ref 0.7–4.0)
MCH: 28.6 pg (ref 26.0–34.0)
MCHC: 32.2 g/dL (ref 30.0–36.0)
MCV: 89 fL (ref 80.0–100.0)
Monocytes Absolute: 0.5 10*3/uL (ref 0.1–1.0)
Monocytes Relative: 4 %
Neutro Abs: 8.9 10*3/uL — ABNORMAL HIGH (ref 1.7–7.7)
Neutrophils Relative %: 78 %
Platelets: 253 10*3/uL (ref 150–400)
RBC: 4.54 MIL/uL (ref 3.87–5.11)
RDW: 16.6 % — ABNORMAL HIGH (ref 11.5–15.5)
WBC: 11.5 10*3/uL — ABNORMAL HIGH (ref 4.0–10.5)
nRBC: 0 % (ref 0.0–0.2)

## 2020-12-25 LAB — COMPREHENSIVE METABOLIC PANEL
ALT: 31 U/L (ref 0–44)
AST: 28 U/L (ref 15–41)
Albumin: 3.8 g/dL (ref 3.5–5.0)
Alkaline Phosphatase: 43 U/L (ref 38–126)
Anion gap: 12 (ref 5–15)
BUN: 10 mg/dL (ref 6–20)
CO2: 20 mmol/L — ABNORMAL LOW (ref 22–32)
Calcium: 9.2 mg/dL (ref 8.9–10.3)
Chloride: 104 mmol/L (ref 98–111)
Creatinine, Ser: 0.65 mg/dL (ref 0.44–1.00)
GFR, Estimated: >60 mL/min (ref >60)
Glucose, Bld: 133 mg/dL — ABNORMAL HIGH (ref 70–99)
Potassium: 3.7 mmol/L (ref 3.5–5.1)
Sodium: 136 mmol/L (ref 135–145)
Total Bilirubin: 0.3 mg/dL (ref 0.3–1.2)
Total Protein: 7.5 g/dL (ref 6.5–8.1)

## 2020-12-25 LAB — LIPASE, BLOOD: Lipase: 43 U/L (ref 11–51)

## 2020-12-25 LAB — CBG MONITORING, ED: Glucose-Capillary: 145 mg/dL — ABNORMAL HIGH (ref 70–99)

## 2020-12-25 NOTE — Discharge Instructions (Addendum)

## 2020-12-25 NOTE — ED Triage Notes (Signed)
Pt with abd pain to RUQ after eating.  Pt states her blood sugars have been dropping. Pt requesting her thyroid to be checked and her electrolytes.

## 2020-12-26 LAB — TSH: TSH: 1.079 u[IU]/mL (ref 0.350–4.500)

## 2020-12-26 LAB — T4, FREE: Free T4: 0.94 ng/dL (ref 0.61–1.12)

## 2020-12-26 NOTE — ED Provider Notes (Signed)
[facility] EMERGENCY DEPARTMENT Provider Note   CSN: [id number] Arrival date & time: 12/25/20  2048     History Chief Complaint  Patient presents with  . Abdominal Pain    Heather Fry is a 45 y.o. female.  The history is provided by the patient.  Abdominal Pain Pain location:  Epigastric and RUQ Pain quality: cramping   Pain radiates to:  Does not radiate Pain severity:  Moderate Onset quality:  Gradual Timing:  Intermittent Progression:  Unchanged Chronicity:  New Relieved by:  Nothing Worsened by:  Movement, palpation and eating Associated symptoms: nausea   Associated symptoms: no chest pain, no diarrhea, no fever, no hematochezia, no melena and no vomiting    Patient presents for multiple complaints.  She reports it all started 3 weeks ago when she had injections in her knees.  Since that time she has had difficulty controlling her glucose is now having upper abdominal pain.  The pain seems worsened with eating.  No fevers or vomiting.  She is also requesting that we check her thyroid studies.  Patient has had previous cholecystectomy and hysterectomy   Denies any recent NSAID use Past Medical History:  Diagnosis Date  . Anxiety   . Arthritis   . Depression   . Diverticulosis   . GERD (gastroesophageal reflux disease)   . Graves disease   . Internal hemorrhoids   . Thyroid disease   . Uterine fibroid     Patient Active Problem List   Diagnosis Date Noted  . Weight gain 12/20/2020  . Diarrhea 05/08/2020  . Idiopathic peripheral neuropathy 04/18/2020  . Myelopathy ([place]) 04/18/2020  . OSA on CPAP 04/18/2020  . RLS (restless legs syndrome) 04/18/2020  . Sensory disturbance 01/20/2020  . Atypical chest pain 01/20/2020  . Obesity, Class III, BMI 40-49.9 (morbid obesity) ([place]) 01/20/2020  . Weakness 01/19/2020  . Hyperglycemia 10/04/2019  . Myalgia 09/05/2019  . Vitamin B 12 deficiency 07/28/2019  . Right sided numbness 06/29/2019  . Constipation  06/15/2019  . Elevated lipase 06/15/2019  . Hypomagnesemia 03/29/2019  . Leukocytosis 03/29/2019  . Abdominal pain 02/10/2019  . Nausea without vomiting 02/10/2019  . Tingling of face 11/30/2018  . Hot flashes 11/30/2018  . Vitamin D deficiency 10/06/2018  . GERD (gastroesophageal reflux disease) 01/28/2017  . Rectal bleeding 01/28/2017  . 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 Hypothyroidism 04/14/2016  . Gastric polyp   . RUQ abdominal pain 09/06/2014  . Excessive or frequent menstruation 01/13/2013  . Depression     Past Surgical History:  Procedure Laterality Date  . ABDOMINAL HYSTERECTOMY     Per patient, uterine fibroids.  . ABDOMINAL HYSTERECTOMY    . CHOLECYSTECTOMY N/A 11/06/2014   Procedure: LAPAROSCOPIC CHOLECYSTECTOMY;  Surgeon: Jamesetta So, MD;  Location: [facility];  Service: General;  Laterality: N/A;  . COLONOSCOPY N/A 03/04/2017   Dr. Gala Romney: Hemorrhoids, diverticulosis, benign polyp without adenomatous changes.  Next colonoscopy 10 years  . DILATION AND CURETTAGE OF UTERUS    . ESOPHAGOGASTRODUODENOSCOPY N/A 10/02/2014   Dr. Gala Romney: fundic gland polyps, hiatal hernia  . MOUTH SURGERY     extraction of teeth  . POLYPECTOMY  03/04/2017   Procedure: POLYPECTOMY;  Surgeon: Daneil Dolin, MD;  Location: [facility];  Service: Endoscopy;;  colon  . THYROIDECTOMY N/A 09/30/2018   Procedure: TOTAL THYROIDECTOMY;  Surgeon: Armandina Gemma, MD;  Location: [facility];  Service: General;  Laterality: N/A;  . TUBAL LIGATION       OB  History    Gravida  2   Para  2   Term  2   Preterm      AB      Living  2     SAB      IAB      Ectopic      Multiple      Live Births              Family History  Problem Relation Age of Onset  . Hypertension Father   . Diabetes Father   . Depression Other   . Uterine cancer Mother        ?cervical cancer  . Uterine cancer Sister        suicide  . Colon cancer Paternal Aunt 41  . Thyroid disease Neg Hx     Social History    Tobacco Use  . Smoking status: Former Smoker    Packs/day: 1.00    Years: 5.00    Pack years: 5.00    Types: Cigarettes    Quit date: 11/03/1997    Years since quitting: 23.1  . Smokeless tobacco: Never Used  . Tobacco comment: 1 pack 1 week  Vaping Use  . Vaping Use: Never used  Substance Use Topics  . Alcohol use: No    Alcohol/week: 0.0 standard drinks  . Drug use: No    Home Medications Prior to Admission medications   Medication Sig Start Date End Date Taking? Authorizing Provider  acetaminophen (TYLENOL) 500 MG tablet Take 1,000 mg by mouth daily as needed for moderate pain.    [provider]  albuterol (VENTOLIN HFA) 108 (90 Base) MCG/ACT inhaler Inhale 2 puffs into the lungs every 6 (six) hours as needed. 12/13/19   Julian Hy, DO  budesonide-formoterol (SYMBICORT) 160-4.5 MCG/ACT inhaler Inhale 2 puffs into the lungs in the morning and at bedtime. 12/13/19   Julian Hy, DO  Cholecalciferol (VITAMIN D3) 125 MCG (5000 UT) CAPS Take 1 capsule (5,000 Units total) by mouth daily. 10/13/19   Renato Shin, MD  escitalopram (LEXAPRO) 20 MG tablet Take 1 tablet (20 mg total) by mouth at bedtime. 01/26/19   Renato Shin, MD  levothyroxine (SYNTHROID) 175 MCG tablet Take 1 tablet (175 mcg total) by mouth daily before breakfast. 12/20/20   Renato Shin, MD  losartan (COZAAR) 25 MG tablet Take 1 tablet (25 mg total) by mouth daily. Patient not taking: No sig reported 05/31/20 08/29/20  Donato Heinz, MD  Magnesium 250 MG TABS Take 250 mg by mouth at bedtime.     [provider]  montelukast (SINGULAIR) 10 MG tablet TAKE 1 TABLET BY MOUTH AT BEDTIME 12/24/20   Noemi Chapel P, DO  NON FORMULARY at bedtime. CPAP at night    [provider]  omeprazole (PRILOSEC) 20 MG capsule Take 20 mg by mouth 2 (two) times daily before a meal.    [provider]  RESTASIS 0.05 % ophthalmic emulsion Place 1 drop into both eyes 2 (two) times daily. 12/26/19    [provider]  simethicone (MYLICON) 161 MG chewable tablet Chew 125-250 mg by mouth every 6 (six) hours as needed for flatulence.     [provider]  Tiotropium Bromide Monohydrate (SPIRIVA RESPIMAT) 1.25 MCG/ACT AERS Inhale 2 puffs into the lungs daily. 01/05/20   Julian Hy, DO  Wheat Dextrin (BENEFIBER DRINK MIX PO) Take 1 Dose by mouth 3 (three) times daily.     [provider]    Allergies    Levofloxacin, Toradol [ketorolac tromethamine], Other, Chamomile, Hctz [hydrochlorothiazide], Losartan potassium, and Penicillins  Review of Systems   Review of Systems  Constitutional: Negative for fever.  Cardiovascular: Negative for chest pain.  Gastrointestinal: Positive for abdominal pain and nausea. Negative for blood in stool, diarrhea, hematochezia, melena and vomiting.  All other systems reviewed and are negative.   Physical Exam Updated Vital Signs BP (!) 154/95 (BP Location: Left Arm)   Pulse 96   Temp 98.2 F (36.8 C) (Oral)   Resp (!) 21   Ht 1.626 m (5\' 4" )   Wt 121.1 kg   LMP 10/09/2014   SpO2 99%   BMI 45.83 kg/m   Physical Exam  CONSTITUTIONAL: Well developed/well nourished, anxious HEAD: Normocephalic/atraumatic EYES: EOMI/PERRL ENMT: Mucous membranes moist NECK: supple no meningeal signs SPINE/BACK:entire spine nontender CV: S1/S2 noted, no murmurs/rubs/gallops noted LUNGS: Lungs are clear to auscultation bilaterally, no apparent distress ABDOMEN: soft, mild epigastric and right upper quadrant tenderness, no rebound or guarding, bowel sounds noted throughout abdomen GU:no cva tenderness NEURO: Pt is awake/alert/appropriate, moves all extremitiesx4.  No facial droop.   EXTREMITIES: pulses normal/equal, full ROM SKIN: warm, color normal PSYCH: Anxious  ED Results / Procedures / Treatments   Labs (all labs ordered are listed, but only abnormal results are displayed) Labs Reviewed  CBC WITH DIFFERENTIAL/PLATELET - Abnormal;  Notable for the following components:      Result Value   WBC 11.5 (*)    RDW 16.6 (*)    Neutro Abs 8.9 (*)    All other components within normal limits  COMPREHENSIVE METABOLIC PANEL - Abnormal; Notable for the following components:   CO2 20 (*)    Glucose, Bld 133 (*)    All other components within normal limits  CBG MONITORING, ED - Abnormal; Notable for the following components:   Glucose-Capillary 145 (*)    All other components within normal limits  LIPASE, BLOOD  TSH  T4, FREE  T3, FREE    EKG EKG Interpretation  Date/Time:  Tuesday December 25 2020 23:23:08 EDT Ventricular Rate:  92 PR Interval:  147 QRS Duration: 74 QT Interval:  367 QTC Calculation: 454 R Axis:   46 Text Interpretation: Sinus rhythm Low voltage, precordial leads Baseline wander in lead(s) II III aVF Confirmed by Ripley Fraise [phone]) on 12/25/2020 11:36:15 PM   Radiology No results found.  Procedures Procedures   Medications Ordered in ED Medications - No data to display  ED Course  I have reviewed the triage vital signs and the nursing notes.  Pertinent labs  results that were available during my care of the patient were reviewed by me and considered in my medical decision making (see chart for details).    MDM Rules/Calculators/A&P                          Discussed lab findings with patient.  I agreed to add on thyroid studies so she can follow-up with her endocrinologist. Patient's abdominal exam is overall unremarkable besides mild upper abdominal tenderness.  She has already had a cholecystectomy.  She is in no acute distress.  Patient will be discharged home She reports she is already on a PPI twice daily and Carafate, and will follow up with gastroenterology Final Clinical Impression(s) / ED Diagnoses Final diagnoses:  Right upper quadrant abdominal pain    Rx / DC Orders ED Discharge Orders    None  Ripley Fraise, MD 12/26/20 [phone]

## 2020-12-27 ENCOUNTER — Telehealth (INDEPENDENT_AMBULATORY_CARE_PROVIDER_SITE_OTHER): Payer: 59 | Admitting: Endocrinology

## 2020-12-27 ENCOUNTER — Other Ambulatory Visit: Payer: Self-pay

## 2020-12-27 DIAGNOSIS — E038 Other specified hypothyroidism: Secondary | ICD-10-CM

## 2020-12-27 LAB — T3, FREE: T3, Free: 2.6 pg/mL (ref 2.0–4.4)

## 2020-12-27 MED ORDER — FREESTYLE LIBRE 2 READER DEVI: 1.0000 | Freq: Once | 1 refills | Status: AC

## 2020-12-27 MED ORDER — FREESTYLE LIBRE 2 SENSOR MISC: 1.0000 | 3 refills | Status: DC

## 2020-12-27 NOTE — Patient Instructions (Addendum)
Please continue the same levothyroxine.     I have sent a prescription to your pharmacy, for the continuous glucose monitor. Please redo the blood tests next week.

## 2020-12-27 NOTE — Progress Notes (Signed)
   Subjective:    Patient ID: Heather Fry, female    DOB: [birth year], 45 y.o.   MRN: [id number]  HPI telehealth visit today via video visit.  Alternatives to telehealth are presented to this patient, and the patient agrees to the telehealth visit. Pt is advised of the cost of the visit, and agrees to this, also.   Patient is at home, and I am at the office.   Persons attending the telehealth visit: the patient and I.   Pt was seen in ER for fatigue, Fry sweats, and abd pain.  She feels this was due to changing to generic synthroid.  She wants to recheck the thyroid blood tests today She requests continuous glucose monitor.    Review of Systems     Objective:   Physical Exam    Lab Results  Component Value Date   TSH 1.079 12/25/2020       Assessment & Plan:  Hypothyroidism, on rx.  Pt requests to recheck TFT now, but that would be too soon, so we'll recheck next week Hyperglycemia: pt is very concerned that fluctuating glucose is causing sxs.  we discussed.  She requests continuous glucose monitor.

## 2020-12-28 ENCOUNTER — Encounter: Payer: Self-pay | Admitting: Endocrinology

## 2021-01-01 ENCOUNTER — Other Ambulatory Visit: Payer: Self-pay | Admitting: Endocrinology

## 2021-01-01 MED ORDER — SYNTHROID 175 MCG PO TABS: 175.0000 ug | ORAL_TABLET | Freq: Every day | ORAL | 3 refills | Status: DC

## 2021-01-08 ENCOUNTER — Telehealth: Payer: Self-pay

## 2021-01-08 NOTE — Telephone Encounter (Signed)
Opened in error

## 2021-01-11 ENCOUNTER — Encounter (HOSPITAL_COMMUNITY): Payer: Self-pay | Admitting: Emergency Medicine

## 2021-01-11 ENCOUNTER — Other Ambulatory Visit: Payer: Self-pay

## 2021-01-11 ENCOUNTER — Emergency Department (HOSPITAL_COMMUNITY)
Admission: EM | Admit: 2021-01-11 | Discharge: 2021-01-11 | Disposition: A | Discharge status: Home / Self Care | Payer: 59 | Attending: Emergency Medicine | Admitting: Emergency Medicine

## 2021-01-11 DIAGNOSIS — K219 Gastro-esophageal reflux disease without esophagitis: Secondary | ICD-10-CM | POA: Insufficient documentation

## 2021-01-11 DIAGNOSIS — E039 Hypothyroidism, unspecified: Secondary | ICD-10-CM | POA: Insufficient documentation

## 2021-01-11 DIAGNOSIS — R1010 Upper abdominal pain, unspecified: Secondary | ICD-10-CM | POA: Insufficient documentation

## 2021-01-11 DIAGNOSIS — Z79899 Other long term (current) drug therapy: Secondary | ICD-10-CM | POA: Diagnosis not present

## 2021-01-11 DIAGNOSIS — R109 Unspecified abdominal pain: Secondary | ICD-10-CM

## 2021-01-11 DIAGNOSIS — Z87891 Personal history of nicotine dependence: Secondary | ICD-10-CM | POA: Insufficient documentation

## 2021-01-11 LAB — URINALYSIS, ROUTINE W REFLEX MICROSCOPIC
Bilirubin Urine: NEGATIVE
Glucose, UA: NEGATIVE mg/dL
Hgb urine dipstick: NEGATIVE
Ketones, ur: NEGATIVE mg/dL
Leukocytes,Ua: NEGATIVE
Nitrite: NEGATIVE
Protein, ur: NEGATIVE mg/dL
Specific Gravity, Urine: 1.003 — ABNORMAL LOW (ref 1.005–1.030)
pH: 7 (ref 5.0–8.0)

## 2021-01-11 LAB — CBC WITH DIFFERENTIAL/PLATELET
Abs Immature Granulocytes: 0.04 10*3/uL (ref 0.00–0.07)
Basophils Absolute: 0 10*3/uL (ref 0.0–0.1)
Basophils Relative: 0 %
Eosinophils Absolute: 0.1 10*3/uL (ref 0.0–0.5)
Eosinophils Relative: 1 %
HCT: 40.1 % (ref 36.0–46.0)
Hemoglobin: 13.1 g/dL (ref 12.0–15.0)
Immature Granulocytes: 0 %
Lymphocytes Relative: 19 %
Lymphs Abs: 1.7 10*3/uL (ref 0.7–4.0)
MCH: 29.4 pg (ref 26.0–34.0)
MCHC: 32.7 g/dL (ref 30.0–36.0)
MCV: 89.9 fL (ref 80.0–100.0)
Monocytes Absolute: 0.5 10*3/uL (ref 0.1–1.0)
Monocytes Relative: 6 %
Neutro Abs: 6.6 10*3/uL (ref 1.7–7.7)
Neutrophils Relative %: 74 %
Platelets: 297 10*3/uL (ref 150–400)
RBC: 4.46 MIL/uL (ref 3.87–5.11)
RDW: 15.9 % — ABNORMAL HIGH (ref 11.5–15.5)
WBC: 9 10*3/uL (ref 4.0–10.5)
nRBC: 0 % (ref 0.0–0.2)

## 2021-01-11 LAB — COMPREHENSIVE METABOLIC PANEL
ALT: 39 U/L (ref 0–44)
AST: 43 U/L — ABNORMAL HIGH (ref 15–41)
Albumin: 4 g/dL (ref 3.5–5.0)
Alkaline Phosphatase: 46 U/L (ref 38–126)
Anion gap: 11 (ref 5–15)
BUN: 7 mg/dL (ref 6–20)
CO2: 23 mmol/L (ref 22–32)
Calcium: 8.8 mg/dL — ABNORMAL LOW (ref 8.9–10.3)
Chloride: 101 mmol/L (ref 98–111)
Creatinine, Ser: 0.61 mg/dL (ref 0.44–1.00)
GFR, Estimated: >60 mL/min (ref >60)
Glucose, Bld: 100 mg/dL — ABNORMAL HIGH (ref 70–99)
Potassium: 3.6 mmol/L (ref 3.5–5.1)
Sodium: 135 mmol/L (ref 135–145)
Total Bilirubin: 0.4 mg/dL (ref 0.3–1.2)
Total Protein: 7.8 g/dL (ref 6.5–8.1)

## 2021-01-11 LAB — LIPASE, BLOOD: Lipase: 42 U/L (ref 11–51)

## 2021-01-11 MED ORDER — SODIUM CHLORIDE 0.9 % IV BOLUS
1000.0000 mL | Freq: Once | INTRAVENOUS | Status: AC
  Administered 2021-01-11: 1000 mL via INTRAVENOUS

## 2021-01-11 MED ORDER — FAMOTIDINE IN NACL 20-0.9 MG/50ML-% IV SOLN
20.0000 mg | Freq: Once | INTRAVENOUS | Status: AC
  Administered 2021-01-11: 20 mg via INTRAVENOUS

## 2021-01-11 MED ORDER — LIDOCAINE VISCOUS HCL 2 % MT SOLN
15.0000 mL | Freq: Once | OROMUCOSAL | Status: AC
  Administered 2021-01-11: 15 mL via ORAL

## 2021-01-11 MED ORDER — ALUM & MAG HYDROXIDE-SIMETH 200-200-20 MG/5ML PO SUSP
30.0000 mL | Freq: Once | ORAL | Status: AC
  Administered 2021-01-11: 30 mL via ORAL
  Filled 2021-01-11: qty 30

## 2021-01-11 MED ORDER — SUCRALFATE 1 GM/10ML PO SUSP
1.0000 g | Freq: Once | ORAL | Status: AC
  Administered 2021-01-11: 1 g via ORAL
  Filled 2021-01-11: qty 10

## 2021-01-11 NOTE — ED Provider Notes (Signed)
[facility] EMERGENCY DEPARTMENT Provider Note   CSN: [id number] Arrival date & time: 01/11/21  1417     History Chief Complaint  Patient presents with  . Abdominal Pain    Heather Fry is a 45 y.o. female.  HPI   Pt is a 45 y/o female with a h/o anxiety, arthritis, depression, diverticulosis, GERD, Graves disease, hemorrhoids, thyroid disease, fibroids, asthma, peripheral neuropathy, who presents to the ED today for eval of abd pain.  Patient states that his symptoms started a few days ago and is located to the upper abdomen.  She was seen at an outside ED in [place] who did a CT scan that was reassuring however her lipase was significantly elevated and they felt her symptoms were consistent with pancreatitis.  They advised her to be on a clear liquid diet and advised her to follow-up with her PCP.  She tried to call her gastroenterologist and does have an appointment with them coming up and was also seen by her PCP who advised her if her symptoms were not improving she need to come to the ED again.  She states that she was feeling better for a short time however her symptoms worsen and she just feels poorly overall.  She reports nausea and persistent abdominal pain.  She denies any vomiting.  She did have some diarrhea initially but that is since resolved.  She denies any history of EtOH use.  She denies using anti-inflammatories she does take Carafate and Protonix at home.  Past Medical History:  Diagnosis Date  . Anxiety   . Arthritis   . Depression   . Diverticulosis   . GERD (gastroesophageal reflux disease)   . Graves disease   . Internal hemorrhoids   . Thyroid disease   . Uterine fibroid     Patient Active Problem List   Diagnosis Date Noted  . Weight gain 12/20/2020  . Diarrhea 05/08/2020  . Idiopathic peripheral neuropathy 04/18/2020  . Myelopathy ([place]) 04/18/2020  . OSA on CPAP 04/18/2020  . RLS (restless legs syndrome) 04/18/2020  . Sensory disturbance  01/20/2020  . Atypical chest pain 01/20/2020  . Obesity, Class III, BMI 40-49.9 (morbid obesity) ([place]) 01/20/2020  . Weakness 01/19/2020  . Hyperglycemia 10/04/2019  . Myalgia 09/05/2019  . Vitamin B 12 deficiency 07/28/2019  . Right sided numbness 06/29/2019  . Constipation 06/15/2019  . Elevated lipase 06/15/2019  . Hypomagnesemia 03/29/2019  . Leukocytosis 03/29/2019  . Abdominal pain 02/10/2019  . Nausea without vomiting 02/10/2019  . Tingling of face 11/30/2018  . Hot flashes 11/30/2018  . Vitamin D deficiency 10/06/2018  . GERD (gastroesophageal reflux disease) 01/28/2017  . Rectal bleeding 01/28/2017  . 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 Hypothyroidism 04/14/2016  . Gastric polyp   . RUQ abdominal pain 09/06/2014  . Excessive or frequent menstruation 01/13/2013  . Depression     Past Surgical History:  Procedure Laterality Date  . ABDOMINAL HYSTERECTOMY     Per patient, uterine fibroids.  . ABDOMINAL HYSTERECTOMY    . CHOLECYSTECTOMY N/A 11/06/2014   Procedure: LAPAROSCOPIC CHOLECYSTECTOMY;  Surgeon: Jamesetta So, MD;  Location: [facility];  Service: General;  Laterality: N/A;  . COLONOSCOPY N/A 03/04/2017   Dr. Gala Romney: Hemorrhoids, diverticulosis, benign polyp without adenomatous changes.  Next colonoscopy 10 years  . DILATION AND CURETTAGE OF UTERUS    . ESOPHAGOGASTRODUODENOSCOPY N/A 10/02/2014   Dr. Gala Romney: fundic gland polyps, hiatal hernia  . MOUTH SURGERY     extraction of teeth  .  POLYPECTOMY  03/04/2017   Procedure: POLYPECTOMY;  Surgeon: Daneil Dolin, MD;  [facility];  Service: Endoscopy;;  colon  . THYROIDECTOMY N/A 09/30/2018   Procedure: TOTAL THYROIDECTOMY;  Surgeon: Armandina Gemma, MD;  Location: [facility];  Service: General;  Laterality: N/A;  . TUBAL LIGATION       OB History    Gravida  2   Para  2   Term  2   Preterm      AB      Living  2     SAB      IAB      Ectopic      Multiple      Live Births              Family  History  Problem Relation Age of Onset  . Hypertension Father   . Diabetes Father   . Depression Other   . Uterine cancer Mother        ?cervical cancer  . Uterine cancer Sister        suicide  . Colon cancer Paternal Aunt 61  . Thyroid disease Neg Hx     Social History   Tobacco Use  . Smoking status: Former Smoker    Packs/day: 1.00    Years: 5.00    Pack years: 5.00    Types: Cigarettes    Quit date: 11/03/1997    Years since quitting: 23.2  . Smokeless tobacco: Never Used  . Tobacco comment: 1 pack 1 week  Vaping Use  . Vaping Use: Never used  Substance Use Topics  . Alcohol use: No    Alcohol/week: 0.0 standard drinks  . Drug use: No    Home Medications Prior to Admission medications   Medication Sig Start Date End Date Taking? Authorizing Provider  acetaminophen (TYLENOL) 500 MG tablet Take 650 mg by mouth daily as needed for moderate pain.   Yes [provider]  acetaminophen (TYLENOL) 650 MG CR tablet Take 650 mg by mouth every 8 (eight) hours as needed for pain.   Yes [provider]  albuterol (VENTOLIN HFA) 108 (90 Base) MCG/ACT inhaler Inhale 2 puffs into the lungs every 6 (six) hours as needed. 12/13/19  Yes Julian Hy, DO  cetirizine (ZYRTEC) 10 MG tablet Take 10 mg by mouth daily.   Yes [provider]  Cholecalciferol (VITAMIN D3) 125 MCG (5000 UT) CAPS Take 1 capsule (5,000 Units total) by mouth daily. 10/13/19  Yes Renato Shin, MD  EPINEPHrine 0.3 mg/0.3 mL IJ SOAJ injection Inject 0.3 mg into the muscle as needed. 08/10/20  Yes [provider]  escitalopram (LEXAPRO) 20 MG tablet Take 1 tablet (20 mg total) by mouth at bedtime. 01/26/19  Yes Renato Shin, MD  fluticasone [facility]) 50 MCG/ACT nasal spray Place 2 sprays into both nostrils daily.   Yes [provider]  LIDOCAINE-MENTHOL ROLL-ON EX Apply 1 application topically daily as needed (pain).   Yes [provider]  Magnesium 250 MG TABS Take  250 mg by mouth at bedtime.    Yes [provider]  MELATONIN PO Take 2.5 mg by mouth at bedtime.   Yes [provider]  NON FORMULARY at bedtime. CPAP at night   Yes [provider]  pantoprazole (PROTONIX) 40 MG tablet Take 40 mg by mouth 2 (two) times daily. 08/20/20  Yes [provider]  potassium chloride (KLOR-CON) 10 MEQ tablet Take 10 mEq by mouth daily. 06/22/20  Yes [provider]  PULMICORT FLEXHALER 180 MCG/ACT inhaler 1 puff 2 (two) times daily. 01/01/21  Yes [provider]  RESTASIS 0.05 % ophthalmic emulsion Place 1 drop into both eyes 2 (two) times daily. 12/26/19  Yes [provider]  simethicone (MYLICON) 244 MG chewable tablet Chew 125-250 mg by mouth every 6 (six) hours as needed for flatulence.    Yes [provider]  sucralfate (CARAFATE) 1 g tablet Take 1 g by mouth daily. 08/20/20  Yes [provider]  SYNTHROID 175 MCG tablet Take 1 tablet (175 mcg total) by mouth daily before breakfast. 01/01/21  Yes Renato Shin, MD  Tiotropium Bromide Monohydrate (SPIRIVA RESPIMAT) 1.25 MCG/ACT AERS Inhale 2 puffs into the lungs daily. 01/05/20  Yes Noemi Chapel P, DO  Wheat Dextrin (BENEFIBER DRINK MIX PO) Take 1 Dose by mouth 3 (three) times daily.    Yes [provider]  budesonide-formoterol (SYMBICORT) 160-4.5 MCG/ACT inhaler Inhale 2 puffs into the lungs in the morning and at bedtime. Patient not taking: Reported on 01/11/2021 12/13/19   Noemi Chapel P, DO  Continuous Blood Gluc Sensor (FREESTYLE LIBRE 2 SENSOR) MISC 1 Device by Does not apply route every 14 (fourteen) days. 12/27/20   Renato Shin, MD  losartan (COZAAR) 25 MG tablet Take 1 tablet (25 mg total) by mouth daily. Patient not taking: No sig reported 05/31/20 08/29/20  Donato Heinz, MD  montelukast (SINGULAIR) 10 MG tablet TAKE 1 TABLET BY MOUTH AT BEDTIME Patient not taking: No sig reported 12/24/20   Julian Hy, DO   omeprazole (PRILOSEC) 20 MG capsule Take 20 mg by mouth 2 (two) times daily before a meal. Patient not taking: Reported on 01/11/2021    [provider]    Allergies    Levofloxacin, Toradol [ketorolac tromethamine], Tramadol, Other, Chamomile, Hctz [hydrochlorothiazide], Lisinopril, Losartan potassium, and Penicillins  Review of Systems   Review of Systems  Constitutional: Negative for chills and fever.  HENT: Negative for ear pain and sore throat.   Eyes: Negative for visual disturbance.  Respiratory: Negative for cough and shortness of breath.   Cardiovascular: Negative for chest pain.  Gastrointestinal: Positive for abdominal pain, diarrhea and nausea. Negative for constipation and vomiting.  Genitourinary: Negative for dysuria and hematuria.  Musculoskeletal: Negative for back pain.  Skin: Negative for rash.  Neurological: Negative for headaches.  All other systems reviewed and are negative.   Physical Exam Updated Vital Signs BP (!) 165/101 (BP Location: Left Arm)   Pulse 86   Temp 98.9 F (37.2 C) (Oral)   Resp 20   Ht 5\' 4"  (1.626 m)   Wt 121.6 kg   LMP 10/09/2014   SpO2 97%   BMI 46.00 kg/m   Physical Exam Vitals and nursing note reviewed.  Constitutional:      General: She is not in acute distress.    Appearance: She is well-developed.  HENT:     Head: Normocephalic and atraumatic.  Eyes:     Conjunctiva/sclera: Conjunctivae normal.  Cardiovascular:     Rate and Rhythm: Normal rate and regular rhythm.     Heart sounds: Normal heart sounds. No murmur heard.   Pulmonary:     Effort: Pulmonary effort is normal. No respiratory distress.     Breath sounds: Normal breath sounds. No wheezing, rhonchi or rales.  Abdominal:     General: Bowel sounds are normal.     Palpations: Abdomen is soft.     Tenderness: There is abdominal  tenderness in the right upper quadrant and epigastric area. There is no guarding or rebound.  Musculoskeletal:      Cervical back: Neck supple.  Skin:    General: Skin is warm and dry.  Neurological:     Mental Status: She is alert.     ED Results / Procedures / Treatments   Labs (all labs ordered are listed, but only abnormal results are displayed) Labs Reviewed  COMPREHENSIVE METABOLIC PANEL - Abnormal; Notable for the following components:      Result Value   Glucose, Bld 100 (*)    Calcium 8.8 (*)    AST 43 (*)    All other components within normal limits  CBC WITH DIFFERENTIAL/PLATELET - Abnormal; Notable for the following components:   RDW 15.9 (*)    All other components within normal limits  URINALYSIS, ROUTINE W REFLEX MICROSCOPIC - Abnormal; Notable for the following components:   Color, Urine STRAW (*)    Specific Gravity, Urine 1.003 (*)    All other components within normal limits  LIPASE, BLOOD    EKG None  Radiology No results found.  Procedures Procedures   Medications Ordered in ED Medications  sodium chloride 0.9 % bolus 1,000 mL (0 mLs Intravenous Stopped 01/11/21 1755)  famotidine (PEPCID) IVPB 20 mg premix (0 mg Intravenous Stopped 01/11/21 1755)  alum & mag hydroxide-simeth (MAALOX/MYLANTA) 200-200-20 MG/5ML suspension 30 mL (30 mLs Oral Given 01/11/21 1631)    And  lidocaine (XYLOCAINE) 2 % viscous mouth solution 15 mL (15 mLs Oral Given 01/11/21 1631)  sucralfate (CARAFATE) 1 GM/10ML suspension 1 g (1 g Oral Given 01/11/21 1631)    ED Course  I have reviewed the triage vital signs and the nursing notes.  Pertinent labs & imaging results that were available during my care of the patient were reviewed by me and considered in my medical decision making (see chart for details).    MDM Rules/Calculators/A&P                          45 y/o F presenting for eval of abd pain and nausea,  Reviewed/interpreted labs CBC is unremarkable CMP is grossly unremarkable Lipase wnl UA neg for UTI   CT abd/pelvis from prior visit reviewed from prior visit. Pt w/o  emergent findings. She does have large renal cyst but this seems less likely related to her pain. No evidence of any other emergent abnormality. Her abd exam today is benign and she had some improvement of sxs after zofran, pepcid, carafate, gi cocktail and ivf in the ED. She was able to tolerate po. We discussed plan to initiate pepcid, continue protonix and carafae and to f/u with her GI specialist. She voices understanding and is comfortable with the plan for discharge. I advised on specific return precautions and she voices understanding. All questions answered, pt stable for discharge.    Final Clinical Impression(s) / ED Diagnoses Final diagnoses:  Abdominal pain, unspecified abdominal location    Rx / DC Orders ED Discharge Orders    None       Bishop Dublin 01/11/21 1841    Davonna Belling, MD 01/11/21 2300

## 2021-01-11 NOTE — Discharge Instructions (Signed)
Buy pepcid over the counter and take as directed. Continue your protonix and carafate at home.   Please follow up with your primary doctor within the next 5-7 days.  If you do not have a primary care provider, information for a healthcare clinic has been provided for you to make arrangements for follow up care. Please return to the ER sooner if you have any new or worsening symptoms, or if you have any of the following symptoms:  Abdominal pain that does not go away.  You have a fever.  You keep throwing up (vomiting).  The pain is felt only in portions of the abdomen. Pain in the right side could possibly be appendicitis. In an adult, pain in the left lower portion of the abdomen could be colitis or diverticulitis.  You pass bloody or black tarry stools.  There is bright red blood in the stool.  The constipation stays for more than 4 days.  There is belly (abdominal) or rectal pain.  You do not seem to be getting better.  You have any questions or concerns.

## 2021-01-11 NOTE — ED Triage Notes (Signed)
Pt c/o abdominal pain and has been diagnosed by her PCP with pancreatitis. Pt was seen at [facility] on Monday and her Lipase was over 200.  Pt has not been prescribed anything for pain and was told to stay on a clear liquids and low fat diet.

## 2021-01-15 ENCOUNTER — Ambulatory Visit (INDEPENDENT_AMBULATORY_CARE_PROVIDER_SITE_OTHER): Payer: 59 | Admitting: Family Medicine

## 2021-01-29 ENCOUNTER — Ambulatory Visit (INDEPENDENT_AMBULATORY_CARE_PROVIDER_SITE_OTHER): Payer: Self-pay | Admitting: Family Medicine

## 2021-02-11 ENCOUNTER — Telehealth: Payer: Self-pay

## 2021-02-11 NOTE — Telephone Encounter (Signed)
R73.9

## 2021-02-11 NOTE — Telephone Encounter (Signed)
PA came thru for freestyle [place] and they asking for a diabetes Dx code and I do not see one. I need this to finalize the PA.  Thank you

## 2021-02-21 ENCOUNTER — Other Ambulatory Visit: Payer: Self-pay

## 2021-02-21 ENCOUNTER — Ambulatory Visit: Payer: 59 | Admitting: Endocrinology

## 2021-02-21 VITALS — BP 144/92 | HR 92 | Ht 64.0 in | Wt 268.4 lb

## 2021-02-21 DIAGNOSIS — E538 Deficiency of other specified B group vitamins: Secondary | ICD-10-CM | POA: Diagnosis not present

## 2021-02-21 DIAGNOSIS — E559 Vitamin D deficiency, unspecified: Secondary | ICD-10-CM | POA: Diagnosis not present

## 2021-02-21 DIAGNOSIS — R739 Hyperglycemia, unspecified: Secondary | ICD-10-CM

## 2021-02-21 LAB — BASIC METABOLIC PANEL
BUN: 8 mg/dL (ref 6–23)
CO2: 24 mEq/L (ref 19–32)
Calcium: 9.2 mg/dL (ref 8.4–10.5)
Chloride: 100 mEq/L (ref 96–112)
Creatinine, Ser: 0.68 mg/dL (ref 0.40–1.20)
GFR: 105.5 mL/min (ref >60.00)
Glucose, Bld: 210 mg/dL — ABNORMAL HIGH (ref 70–99)
Potassium: 3.9 mEq/L (ref 3.5–5.1)
Sodium: 135 mEq/L (ref 135–145)

## 2021-02-21 LAB — VITAMIN D 25 HYDROXY (VIT D DEFICIENCY, FRACTURES): VITD: 53.74 ng/mL (ref 30.00–100.00)

## 2021-02-21 NOTE — Progress Notes (Signed)
Subjective:    Patient ID: Heather Fry, female    DOB: December 14, 1975, 45 y.o.   MRN: [id number]  HPI Pt returns for f/u of postsurgical hypothyroidism (she had thyroidectomy 1/20, for hyperthyroidism; she was rx'ed synthroid soon thereafter).  She takes synthroid as rx'ed.   She also has mild postsurgical hypoparathyroidism (she takes vit-D, 10000 units/day).   She also has hypomagnesemia (she takes 250 mg qd).   She has chronic sxs: these have been normal: serum catechols, cortisol, and FSH/LH; she has had TAH but not BSO.    B-12 def: she stopped, due to high level.  Type 2 DM: she has never been on medication for this.  I reviewed continuous glucose monitor data.  Glucose varies from 120-240.  It is in general higher as the day goes on, but not necessarily so.  Past Medical History:  Diagnosis Date  . Anxiety   . Arthritis   . Depression   . Diverticulosis   . GERD (gastroesophageal reflux disease)   . Graves disease   . Internal hemorrhoids   . Thyroid disease   . Uterine fibroid     Past Surgical History:  Procedure Laterality Date  . ABDOMINAL HYSTERECTOMY     Per patient, uterine fibroids.  . ABDOMINAL HYSTERECTOMY    . CHOLECYSTECTOMY N/A 11/06/2014   Procedure: LAPAROSCOPIC CHOLECYSTECTOMY;  Surgeon: Jamesetta So, MD;  Location: [facility];  Service: General;  Laterality: N/A;  . COLONOSCOPY N/A 03/04/2017   Dr. Gala Romney: Hemorrhoids, diverticulosis, benign polyp without adenomatous changes.  Next colonoscopy 10 years  . DILATION AND CURETTAGE OF UTERUS    . ESOPHAGOGASTRODUODENOSCOPY N/A 10/02/2014   Dr. Gala Romney: fundic gland polyps, hiatal hernia  . MOUTH SURGERY     extraction of teeth  . POLYPECTOMY  03/04/2017   Procedure: POLYPECTOMY;  Surgeon: Daneil Dolin, MD;  Location: [facility];  Service: Endoscopy;;  colon  . THYROIDECTOMY N/A 09/30/2018   Procedure: TOTAL THYROIDECTOMY;  Surgeon: Armandina Gemma, MD;  Location: [facility];  Service: General;  Laterality: N/A;  .  TUBAL LIGATION      Social History   Socioeconomic History  . Marital status: Married    Spouse name: Not on file  . Number of children: Not on file  . Years of education: Not on file  . Highest education level: Not on file  Occupational History  . Not on file  Tobacco Use  . Smoking status: Former Smoker    Packs/day: 1.00    Years: 5.00    Pack years: 5.00    Types: Cigarettes    Quit date: 11/03/1997    Years since quitting: 23.3  . Smokeless tobacco: Never Used  . Tobacco comment: 1 pack 1 week  Vaping Use  . Vaping Use: Never used  Substance and Sexual Activity  . Alcohol use: No    Alcohol/week: 0.0 standard drinks  . Drug use: No  . Sexual activity: Yes    Birth control/protection: Surgical  Other Topics Concern  . Not on file  Social History Narrative  . Not on file   Social Determinants of Health   Financial Resource Strain: Not on file  Food Insecurity: Not on file  Transportation Needs: Not on file  Physical Activity: Not on file  Stress: Not on file  Social Connections: Not on file  Intimate Partner Violence: Not on file    Current Outpatient Medications on File Prior to Visit  Medication Sig Dispense Refill  .  acetaminophen (TYLENOL) 500 MG tablet Take 650 mg by mouth daily as needed for moderate pain.    Marland Kitchen acetaminophen (TYLENOL) 650 MG CR tablet Take 650 mg by mouth every 8 (eight) hours as needed for pain.    Marland Kitchen albuterol (VENTOLIN HFA) 108 (90 Base) MCG/ACT inhaler Inhale 2 puffs into the lungs every 6 (six) hours as needed. 18 g 11  . budesonide-formoterol (SYMBICORT) 160-4.5 MCG/ACT inhaler Inhale 2 puffs into the lungs in the morning and at bedtime. 1 Inhaler 11  . cetirizine (ZYRTEC) 10 MG tablet Take 10 mg by mouth daily.    . Cholecalciferol (VITAMIN D3) 125 MCG (5000 UT) CAPS Take 1 capsule (5,000 Units total) by mouth daily. 30 capsule 11  . Continuous Blood Gluc Sensor (FREESTYLE LIBRE 2 SENSOR) MISC 1 Device by Does not apply route every  14 (fourteen) days. 6 each 3  . EPINEPHrine 0.3 mg/0.3 mL IJ SOAJ injection Inject 0.3 mg into the muscle as needed.    Marland Kitchen escitalopram (LEXAPRO) 20 MG tablet Take 1 tablet (20 mg total) by mouth at bedtime. 30 tablet 5  . fluticasone (FLONASE) 50 MCG/ACT nasal spray Place 2 sprays into both nostrils daily.    Marland Kitchen LIDOCAINE-MENTHOL ROLL-ON EX Apply 1 application topically daily as needed (pain).    . Magnesium 250 MG TABS Take 250 mg by mouth at bedtime.     Marland Kitchen MELATONIN PO Take 2.5 mg by mouth at bedtime.    . montelukast (SINGULAIR) 10 MG tablet TAKE 1 TABLET BY MOUTH AT BEDTIME 30 tablet 0  . NON FORMULARY at bedtime. CPAP at [facility]    . omeprazole (PRILOSEC) 20 MG capsule Take 20 mg by mouth 2 (two) times daily before a meal.    . pantoprazole (PROTONIX) 40 MG tablet Take 40 mg by mouth 2 (two) times daily.    . potassium chloride (KLOR-CON) 10 MEQ tablet Take 10 mEq by mouth daily.    Marland Kitchen PULMICORT FLEXHALER 180 MCG/ACT inhaler 1 puff 2 (two) times daily.    . RESTASIS 0.05 % ophthalmic emulsion Place 1 drop into both eyes 2 (two) times daily.    . simethicone (MYLICON) 160 MG chewable tablet Chew 125-250 mg by mouth every 6 (six) hours as needed for flatulence.     . sucralfate (CARAFATE) 1 g tablet Take 1 g by mouth daily.    Marland Kitchen SYNTHROID 175 MCG tablet Take 1 tablet (175 mcg total) by mouth daily before breakfast. 90 tablet 3  . Tiotropium Bromide Monohydrate (SPIRIVA RESPIMAT) 1.25 MCG/ACT AERS Inhale 2 puffs into the lungs daily. 4 g 11  . Wheat Dextrin (BENEFIBER DRINK MIX PO) Take 1 Dose by mouth 3 (three) times daily.     Marland Kitchen losartan (COZAAR) 25 MG tablet Take 1 tablet (25 mg total) by mouth daily. (Patient not taking: No sig reported) 90 tablet 3   No current facility-administered medications on file prior to visit.    Allergies  Allergen Reactions  . Levofloxacin Other (See Comments)    Pt can not have due to taking Lexapro   . Toradol [Ketorolac Tromethamine] Anaphylaxis and Hives   . Tramadol Hives  . Other Other (See Comments)  . Chamomile   . Hctz [Hydrochlorothiazide] Other (See Comments)    Dehydration  . Lisinopril Nausea Only  . Losartan Potassium   . Penicillins Other (See Comments)    Loopy, childhood allergy      Family History  Problem Relation Age of Onset  . Hypertension  Father   . Diabetes Father   . Depression Other   . Uterine cancer Mother        ?cervical cancer  . Uterine cancer Sister        suicide  . Colon cancer Paternal Aunt 31  . Thyroid disease Neg Hx     BP (!) 144/92 (BP Location: Right Arm, Patient Position: Sitting, Cuff Size: Large)   Pulse 92   Ht 5\' 4"  (1.626 m)   Wt 268 lb 6.4 oz (121.7 kg)   LMP 10/09/2014   SpO2 97%   BMI 46.07 kg/m      Review of Systems     Objective:   Physical Exam VITAL SIGNS:  See vs page GENERAL: no distress.  In WC Pulses: dorsalis pedis intact bilat.   MSK: no deformity of the feet CV: no leg edema Skin:  no ulcer on the feet.  normal color and temp on the feet. Neuro: sensation is intact to touch on the feet   outside test results are reviewed: TSH=1.7 Ca++=9.0 Mg++=2.0   Lab Results  Component Value Date   CREATININE 0.61 01/11/2021   BUN 7 01/11/2021   NA 135 01/11/2021   K 3.6 01/11/2021   CL 101 01/11/2021   CO2 23 01/11/2021    A1c=6.6%    Assessment & Plan:  Type 2 DM: We discussed possibility of taking metformin.  She declines, at least for now. Hypothyroidism: well-controlled.  Please continue the same synthroid. Low magnesium: well-controlled.  Please continue the same supplement. Vit-D def: recheck today.

## 2021-02-21 NOTE — Patient Instructions (Addendum)
Please continue the same medications.  Please come back for a follow-up appointment in 3 months.  

## 2021-03-18 ENCOUNTER — Telehealth: Payer: Self-pay | Admitting: Endocrinology

## 2021-03-18 NOTE — Telephone Encounter (Signed)
Pt called to let us know her Freestyle [place] 2 sensors need Prior Authorization:  [place], [place] [address] Phone:  [phone]  Fax:  [phone]

## 2021-03-19 ENCOUNTER — Telehealth: Payer: Self-pay | Admitting: Pharmacy Technician

## 2021-03-19 NOTE — Telephone Encounter (Addendum)
Patient Advocate Encounter   Received notification from [facility] that prior authorization for FREESTYLE LIBRE 2 SENSOR is required.   PA submitted on 03/20/2021 Key [id number] Status is APPROVED through 09/19/2021    [facility] will continue to follow.   Venida Jarvis. Nadara Mustard, CPhT Patient Advocate [facility] Phone: [phone] Fax:  [phone]

## 2021-03-27 ENCOUNTER — Emergency Department (HOSPITAL_COMMUNITY): Payer: Medicaid Other

## 2021-03-27 ENCOUNTER — Emergency Department (HOSPITAL_COMMUNITY)
Admission: EM | Admit: 2021-03-27 | Discharge: 2021-03-28 | Disposition: A | Discharge status: Home / Self Care | Payer: Medicaid Other | Attending: Emergency Medicine | Admitting: Emergency Medicine

## 2021-03-27 ENCOUNTER — Other Ambulatory Visit: Payer: Self-pay

## 2021-03-27 ENCOUNTER — Encounter (HOSPITAL_COMMUNITY): Payer: Self-pay | Admitting: Emergency Medicine

## 2021-03-27 DIAGNOSIS — Z79899 Other long term (current) drug therapy: Secondary | ICD-10-CM | POA: Insufficient documentation

## 2021-03-27 DIAGNOSIS — G43109 Migraine with aura, not intractable, without status migrainosus: Secondary | ICD-10-CM

## 2021-03-27 DIAGNOSIS — Z87891 Personal history of nicotine dependence: Secondary | ICD-10-CM | POA: Diagnosis not present

## 2021-03-27 DIAGNOSIS — E039 Hypothyroidism, unspecified: Secondary | ICD-10-CM | POA: Insufficient documentation

## 2021-03-27 DIAGNOSIS — G43B0 Ophthalmoplegic migraine, not intractable: Secondary | ICD-10-CM | POA: Insufficient documentation

## 2021-03-27 DIAGNOSIS — M542 Cervicalgia: Secondary | ICD-10-CM | POA: Insufficient documentation

## 2021-03-27 DIAGNOSIS — H5711 Ocular pain, right eye: Secondary | ICD-10-CM | POA: Diagnosis present

## 2021-03-27 LAB — CBG MONITORING, ED: Glucose-Capillary: 167 mg/dL — ABNORMAL HIGH (ref 70–99)

## 2021-03-27 MED ORDER — TETRACAINE HCL 0.5 % OP SOLN
2.0000 [drp] | Freq: Once | OPHTHALMIC | Status: AC
  Administered 2021-03-27: 2 [drp] via OPHTHALMIC
  Filled 2021-03-27: qty 4

## 2021-03-27 MED ORDER — FLUORESCEIN SODIUM 1 MG OP STRP
1.0000 | ORAL_STRIP | Freq: Once | OPHTHALMIC | Status: AC
  Administered 2021-03-27: 1 via OPHTHALMIC
  Filled 2021-03-27: qty 1

## 2021-03-27 NOTE — ED Triage Notes (Addendum)
Pt c/o right eye problem and neck pain. Pt states that she noticed that she had a floater in right eye then lost partial vision in right eye for just a few seconds. States hx of slipped disc. States she took tylenol at home. Pt also states that she has a headache today.

## 2021-03-27 NOTE — ED Provider Notes (Addendum)
[facility] EMERGENCY DEPARTMENT Provider Note   CSN: [id number] Arrival date & time: 03/27/21  2211     History Chief Complaint  Patient presents with   Eye Pain   Neck Pain    Heather Fry is a 45 y.o. female.  Patient with a history of Graves' disease, diet-controlled diabetes, GERD, diverticulosis, anxiety here with loss of peripheral vision and flashes and floaters in her right eye.  States she was resting around 7 PM when she began to see a flash of light in her right eye surrounded by blurry halo and zigzags.  This spot expanded and she says she lost peripheral vision to her right eye and could not see anything out of her periphery for about 30 minutes.  Had progressively worsening headache during this time.  Denies thunderclap onset of headache.  Patient is back to baseline now.  Denies any spots or floaters.  Denies any blurry vision or double vision.  No focal weakness, numbness or tingling.  No difficulty speaking or difficulty swallowing.  No chest pain or shortness of breath.  Before this started she had a gradual onset headache does not suddenly worsen.  No history of ocular migraines though her daughter does have them. Had some pain in her right neck when the eye issue was going on but has a history of slipped disc. She feels like she is back to baseline now with no headache and no visual changes  The history is provided by the patient.  Eye Pain Associated symptoms include headaches. Pertinent negatives include no chest pain, no abdominal pain and no shortness of breath.  Neck Pain Associated symptoms: headaches   Associated symptoms: no chest pain, no fever and no weakness       Past Medical History:  Diagnosis Date   Anxiety    Arthritis    Depression    Diverticulosis    GERD (gastroesophageal reflux disease)    Graves disease    Internal hemorrhoids    Thyroid disease    Uterine fibroid     Patient Active Problem List   Diagnosis Date Noted    Weight gain 12/20/2020   Diarrhea 05/08/2020   Idiopathic peripheral neuropathy 04/18/2020   Myelopathy ([place]) 04/18/2020   OSA on CPAP 04/18/2020   RLS (restless legs syndrome) 04/18/2020   Sensory disturbance 01/20/2020   Atypical chest pain 01/20/2020   Obesity, Class III, BMI 40-49.9 (morbid obesity) ([place]) 01/20/2020   Weakness 01/19/2020   Hyperglycemia 10/04/2019   Myalgia 09/05/2019   Vitamin B 12 deficiency 07/28/2019   Right sided numbness 06/29/2019   Constipation 06/15/2019   Elevated lipase 06/15/2019   Hypomagnesemia 03/29/2019   Leukocytosis 03/29/2019   Abdominal pain 02/10/2019   Nausea without vomiting 02/10/2019   Tingling of face 11/30/2018   Hot flashes 11/30/2018   Vitamin D deficiency 10/06/2018   GERD (gastroesophageal reflux disease) 01/28/2017   Rectal bleeding 01/28/2017   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   ABDOMINAL HYSTERECTOMY     CHOLECYSTECTOMY N/A 11/06/2014   Procedure: LAPAROSCOPIC CHOLECYSTECTOMY;  Surgeon: Jamesetta So, MD;  Location: [facility] ORS;  Service: General;  Laterality: N/A;   COLONOSCOPY N/A 03/04/2017   Dr. Gala Romney: Hemorrhoids, diverticulosis, benign polyp without  adenomatous changes.  Next colonoscopy 10 years   DILATION AND CURETTAGE OF UTERUS     ESOPHAGOGASTRODUODENOSCOPY N/A 10/02/2014   Dr. Gala Romney: fundic gland polyps, hiatal hernia   MOUTH SURGERY     extraction of teeth   POLYPECTOMY  03/04/2017   Procedure: POLYPECTOMY;  Surgeon: Daneil Dolin, MD;  Location: [facility] SUITE;  Service: Endoscopy;;  colon   THYROIDECTOMY N/A 09/30/2018   Procedure: TOTAL THYROIDECTOMY;  Surgeon: Armandina Gemma, MD;  Location: [facility];  Service: General;  Laterality: N/A;   TUBAL LIGATION       OB  History     Gravida  2   Para  2   Term  2   Preterm      AB      Living  2      SAB      IAB      Ectopic      Multiple      Live Births              Family History  Problem Relation Age of Onset   Hypertension Father    Diabetes Father    Depression Other    Uterine cancer Mother        ?cervical cancer   Uterine cancer Sister        suicide   Colon cancer Paternal Aunt 60   Thyroid disease Neg Hx     Social History   Tobacco Use   Smoking status: Former    Packs/day: 1.00    Years: 5.00    Pack years: 5.00    Types: Cigarettes    Quit date: 11/03/1997    Years since quitting: 23.4   Smokeless tobacco: Never   Tobacco comments:    1 pack 1 week  Vaping Use   Vaping Use: Never used  Substance Use Topics   Alcohol use: No    Alcohol/week: 0.0 standard drinks   Drug use: No    Home Medications Prior to Admission medications   Medication Sig Start Date End Date Taking? Authorizing Provider  acetaminophen (TYLENOL) 500 MG tablet Take 650 mg by mouth daily as needed for moderate pain.    [provider]  acetaminophen (TYLENOL) 650 MG CR tablet Take 650 mg by mouth every 8 (eight) hours as needed for pain.    [provider]  albuterol (VENTOLIN HFA) 108 (90 Base) MCG/ACT inhaler Inhale 2 puffs into the lungs every 6 (six) hours as needed. 12/13/19   Julian Hy, DO  budesonide-formoterol (SYMBICORT) 160-4.5 MCG/ACT inhaler Inhale 2 puffs into the lungs in the morning and at bedtime. 12/13/19   Julian Hy, DO  cetirizine (ZYRTEC) 10 MG tablet Take 10 mg by mouth daily.    [provider]  Cholecalciferol (VITAMIN D3) 125 MCG (5000 UT) CAPS Take 1 capsule (5,000 Units total) by mouth daily. 10/13/19   Renato Shin, MD  Continuous Blood Gluc Sensor (FREESTYLE LIBRE 2 SENSOR) MISC 1 Device by Does not apply route every 14 (fourteen) days. 12/27/20   Renato Shin, MD  EPINEPHrine 0.3 mg/0.3 mL IJ SOAJ injection Inject  0.3 mg into the muscle as needed. 08/10/20   [provider]  escitalopram (LEXAPRO) 20 MG tablet Take 1 tablet (20 mg total) by mouth at bedtime. 01/26/19   Renato Shin, MD  fluticasone (FLONASE) 50 MCG/ACT nasal spray Place 2 sprays into both nostrils daily.    [provider]  LIDOCAINE-MENTHOL ROLL-ON EX  Apply 1 application topically daily as needed (pain).    [provider]  losartan (COZAAR) 25 MG tablet Take 1 tablet (25 mg total) by mouth daily. Patient not taking: No sig reported 05/31/20 08/29/20  Donato Heinz, MD  Magnesium 250 MG TABS Take 250 mg by mouth at bedtime.     [provider]  MELATONIN PO Take 2.5 mg by mouth at bedtime.    [provider]  montelukast (SINGULAIR) 10 MG tablet TAKE 1 TABLET BY MOUTH AT BEDTIME 12/24/20   Noemi Chapel P, DO  NON FORMULARY at bedtime. CPAP at night    [provider]  omeprazole (PRILOSEC) 20 MG capsule Take 20 mg by mouth 2 (two) times daily before a meal.    [provider]  pantoprazole (PROTONIX) 40 MG tablet Take 40 mg by mouth 2 (two) times daily. 08/20/20   [provider]  potassium chloride (KLOR-CON) 10 MEQ tablet Take 10 mEq by mouth daily. 06/22/20   [provider]  PULMICORT FLEXHALER 180 MCG/ACT inhaler 1 puff 2 (two) times daily. 01/01/21   [provider]  RESTASIS 0.05 % ophthalmic emulsion Place 1 drop into both eyes 2 (two) times daily. 12/26/19   [provider]  simethicone (MYLICON) 952 MG chewable tablet Chew 125-250 mg by mouth every 6 (six) hours as needed for flatulence.     [provider]  sucralfate (CARAFATE) 1 g tablet Take 1 g by mouth daily. 08/20/20   [provider]  SYNTHROID 175 MCG tablet Take 1 tablet (175 mcg total) by mouth daily before breakfast. 01/01/21   Renato Shin, MD  Tiotropium Bromide Monohydrate (SPIRIVA RESPIMAT) 1.25 MCG/ACT AERS Inhale 2 puffs into the lungs daily.  01/05/20   Julian Hy, DO  Wheat Dextrin (BENEFIBER DRINK MIX PO) Take 1 Dose by mouth 3 (three) times daily.     [provider]    Allergies    Levofloxacin, Toradol [ketorolac tromethamine], Tramadol, Other, Chamomile, Hctz [hydrochlorothiazide], Lisinopril, Losartan potassium, and Penicillins  Review of Systems   Review of Systems  Constitutional:  Negative for activity change, appetite change and fever.  HENT:  Negative for congestion and rhinorrhea.   Eyes:  Positive for pain and visual disturbance.  Respiratory:  Negative for cough, chest tightness and shortness of breath.   Cardiovascular:  Negative for chest pain.  Gastrointestinal:  Negative for abdominal pain, nausea and vomiting.  Genitourinary:  Negative for dysuria and hematuria.  Musculoskeletal:  Positive for neck pain.  Skin:  Negative for rash.  Neurological:  Positive for headaches. Negative for dizziness, speech difficulty, weakness and light-headedness.   all other systems are negative except as noted in the HPI and PMH.   Physical Exam Updated Vital Signs BP (!) 154/98 (BP Location: Left Arm)   Pulse 80   Temp 98.5 F (36.9 C) (Oral)   Resp 20   Ht $R'5\' 4"'XO$  (1.626 m)   Wt 121.7 kg   LMP 10/09/2014   SpO2 100%   BMI 46.05 kg/m   Physical Exam Vitals and nursing note reviewed.  Constitutional:      General: She is not in acute distress.    Appearance: She is well-developed.  HENT:     Head: Normocephalic and atraumatic.     Mouth/Throat:     Pharynx: No oropharyngeal exudate.  Eyes:     General: Lids are normal.     Intraocular pressure: Right eye pressure is 20 mmHg. Measurements were  taken using a handheld tonometer.    Conjunctiva/sclera: Conjunctivae normal.     Pupils: Pupils are equal, round, and reactive to light.     Comments: No areas of fluorescein uptake.  No hyphema or hypopyon.  Intraocular pressure is 20.  No corneal abrasion. Visual fields full to confrontation  Neck:      Comments: No meningismus. Cardiovascular:     Rate and Rhythm: Normal rate and regular rhythm.     Heart sounds: Normal heart sounds. No murmur heard. Pulmonary:     Effort: Pulmonary effort is normal. No respiratory distress.     Breath sounds: Normal breath sounds.  Abdominal:     Palpations: Abdomen is soft.     Tenderness: There is no abdominal tenderness. There is no guarding or rebound.  Musculoskeletal:        General: No tenderness. Normal range of motion.     Cervical back: Normal range of motion and neck supple.  Skin:    General: Skin is warm.  Neurological:     Mental Status: She is alert and oriented to person, place, and time.     Cranial Nerves: No cranial nerve deficit.     Motor: No abnormal muscle tone.     Coordination: Coordination normal.     Comments: CN 2-12 intact, no ataxia on finger to nose, no nystagmus, 5/5 strength throughout, no pronator drift, Romberg negative, normal gait.  Visual fields full to confrontation  Psychiatric:        Behavior: Behavior normal.    ED Results / Procedures / Treatments   Labs (all labs ordered are listed, but only abnormal results are displayed) Labs Reviewed  BASIC METABOLIC PANEL - Abnormal; Notable for the following components:      Result Value   Glucose, Bld 158 (*)    Anion gap 3 (*)    All other components within normal limits  SEDIMENTATION RATE - Abnormal; Notable for the following components:   Sed Rate 30 (*)    All other components within normal limits  CBG MONITORING, ED - Abnormal; Notable for the following components:   Glucose-Capillary 167 (*)    All other components within normal limits  CBC WITH DIFFERENTIAL/PLATELET  HCG, SERUM, QUALITATIVE    EKG None  Radiology CT Angio Head W or Wo Contrast  Result Date: 03/28/2021 CLINICAL DATA:  Neck pain with partial vision loss in the right eye. EXAM: CT ANGIOGRAPHY HEAD AND NECK TECHNIQUE: Multidetector CT imaging of the head and neck was  performed using the standard protocol during bolus administration of intravenous contrast. Multiplanar CT image reconstructions and MIPs were obtained to evaluate the vascular anatomy. Carotid stenosis measurements (when applicable) are obtained utilizing NASCET criteria, using the distal internal carotid diameter as the denominator. CONTRAST:  118mL OMNIPAQUE IOHEXOL 350 MG/ML SOLN COMPARISON:  None. FINDINGS: CT HEAD FINDINGS Brain: There is no mass, hemorrhage or extra-axial collection. The size and configuration of the ventricles and extra-axial CSF spaces are normal. There is no acute or chronic infarction. The brain parenchyma is normal. Skull: The visualized skull base, calvarium and extracranial soft tissues are normal. Sinuses/Orbits: No fluid levels or advanced mucosal thickening of the visualized paranasal sinuses. No mastoid or middle ear effusion. The orbits are normal. CTA NECK FINDINGS SKELETON: There is no bony spinal canal stenosis. No lytic or blastic lesion. OTHER NECK: Normal pharynx, larynx and major salivary glands. No cervical lymphadenopathy. Unremarkable thyroid gland. UPPER CHEST: No pneumothorax or pleural effusion. No nodules  or masses. AORTIC ARCH: There is no calcific atherosclerosis of the aortic arch. There is no aneurysm, dissection or hemodynamically significant stenosis of the visualized portion of the aorta. Conventional 3 vessel aortic branching pattern. The visualized proximal subclavian arteries are widely patent. RIGHT CAROTID SYSTEM: Normal without aneurysm, dissection or stenosis. LEFT CAROTID SYSTEM: Normal without aneurysm, dissection or stenosis. VERTEBRAL ARTERIES: Left dominant configuration. Both origins are clearly patent. There is no dissection, occlusion or flow-limiting stenosis to the skull base (V1-V3 segments). CTA HEAD FINDINGS POSTERIOR CIRCULATION: --Vertebral arteries: Normal V4 segments. --Inferior cerebellar arteries: Normal. --Basilar artery: Normal.  --Superior cerebellar arteries: Normal. --Posterior cerebral arteries (PCA): Normal. ANTERIOR CIRCULATION: --Intracranial internal carotid arteries: Normal. --Anterior cerebral arteries (ACA): Normal. Both A1 segments are present. Patent anterior communicating artery (a-comm). --Middle cerebral arteries (MCA): Normal. VENOUS SINUSES: As permitted by contrast timing, patent. ANATOMIC VARIANTS: None Review of the MIP images confirms the above findings. IMPRESSION: Normal CTA of the head and neck. Electronically Signed   By: Ulyses Jarred M.D.   On: 03/28/2021 02:28   CT Angio Neck W and/or Wo Contrast  Result Date: 03/28/2021 CLINICAL DATA:  Neck pain with partial vision loss in the right eye. EXAM: CT ANGIOGRAPHY HEAD AND NECK TECHNIQUE: Multidetector CT imaging of the head and neck was performed using the standard protocol during bolus administration of intravenous contrast. Multiplanar CT image reconstructions and MIPs were obtained to evaluate the vascular anatomy. Carotid stenosis measurements (when applicable) are obtained utilizing NASCET criteria, using the distal internal carotid diameter as the denominator. CONTRAST:  1105mL OMNIPAQUE IOHEXOL 350 MG/ML SOLN COMPARISON:  None. FINDINGS: CT HEAD FINDINGS Brain: There is no mass, hemorrhage or extra-axial collection. The size and configuration of the ventricles and extra-axial CSF spaces are normal. There is no acute or chronic infarction. The brain parenchyma is normal. Skull: The visualized skull base, calvarium and extracranial soft tissues are normal. Sinuses/Orbits: No fluid levels or advanced mucosal thickening of the visualized paranasal sinuses. No mastoid or middle ear effusion. The orbits are normal. CTA NECK FINDINGS SKELETON: There is no bony spinal canal stenosis. No lytic or blastic lesion. OTHER NECK: Normal pharynx, larynx and major salivary glands. No cervical lymphadenopathy. Unremarkable thyroid gland. UPPER CHEST: No pneumothorax or  pleural effusion. No nodules or masses. AORTIC ARCH: There is no calcific atherosclerosis of the aortic arch. There is no aneurysm, dissection or hemodynamically significant stenosis of the visualized portion of the aorta. Conventional 3 vessel aortic branching pattern. The visualized proximal subclavian arteries are widely patent. RIGHT CAROTID SYSTEM: Normal without aneurysm, dissection or stenosis. LEFT CAROTID SYSTEM: Normal without aneurysm, dissection or stenosis. VERTEBRAL ARTERIES: Left dominant configuration. Both origins are clearly patent. There is no dissection, occlusion or flow-limiting stenosis to the skull base (V1-V3 segments). CTA HEAD FINDINGS POSTERIOR CIRCULATION: --Vertebral arteries: Normal V4 segments. --Inferior cerebellar arteries: Normal. --Basilar artery: Normal. --Superior cerebellar arteries: Normal. --Posterior cerebral arteries (PCA): Normal. ANTERIOR CIRCULATION: --Intracranial internal carotid arteries: Normal. --Anterior cerebral arteries (ACA): Normal. Both A1 segments are present. Patent anterior communicating artery (a-comm). --Middle cerebral arteries (MCA): Normal. VENOUS SINUSES: As permitted by contrast timing, patent. ANATOMIC VARIANTS: None Review of the MIP images confirms the above findings. IMPRESSION: Normal CTA of the head and neck. Electronically Signed   By: Ulyses Jarred M.D.   On: 03/28/2021 02:28    Procedures Ultrasound ED Ocular  Date/Time: 03/27/2021 11:34 PM Performed by: Ezequiel Essex, MD Authorized by: Ezequiel Essex, MD   PROCEDURE DETAILS:  Indications: visual change     Assessed:  Right eye   Right eye axial view: obtained     Right eye sagittal view: obtained     Images: archived     Limitations:  None RIGHT EYE FINDINGS:     no foreign body noted in right eye    right eye lens not dislodged    no evidence of retinal detachment of the right eye    no vitreous hemorrhage in right eye   Medications Ordered in ED Medications   fluorescein ophthalmic strip 1 strip (1 strip Both Eyes Given 03/27/21 2330)  tetracaine (PONTOCAINE) 0.5 % ophthalmic solution 2 drop (2 drops Both Eyes Given 03/27/21 2330)    ED Course  I have reviewed the triage vital signs and the nursing notes.  Pertinent labs & imaging results that were available during my care of the patient were reviewed by me and considered in my medical decision making (see chart for details).    MDM Rules/Calculators/A&P                         Flashing in her right eye with loss of peripheral vision, now resolved.  Did have headache at the time which is since resolved.  No other neurological deficits.  Ultrasound shows no obvious  vitreous hemorrhage or retinal detachment.  No significant hyperglycemia.  ESR is 30.  Low suspicion for temporal arteritis.  CTA head and neck is negative for infarct or any kind of aneurysm or stenosis.  Patient back to baseline.  Has no visual field deficits currently. Will discuss with neurology whether she needs further evaluation for TIA versus CVA.  D/w Dr. Geanie Kenning of neurology.  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  Does not feel patient needs further work-up for stroke including MRI.  Follow-up with ophthalmology and neurology as an outpatient.  Patient states she will see her eye doctor tomorrow.  No evidence of retinal detachment on bedside exam today.  Her visual changes have resolved and not recurred.  Patient comfortable with plan.  States she has an appointment with her optometrist at [facility] already tomorrow., She is tolerating p.o. and ambulatory.  Return precautions discussed  Final Clinical Impression(s) / ED Diagnoses Final diagnoses:  Ocular migraine    Rx / DC Orders ED Discharge Orders     None        Thecla Forgione, Annie Main, MD 03/28/21 Alroy Bailiff    Ezequiel Essex, MD 03/28/21 [phone]

## 2021-03-28 LAB — BASIC METABOLIC PANEL
Anion gap: 3 — ABNORMAL LOW (ref 5–15)
BUN: 9 mg/dL (ref 6–20)
CO2: 30 mmol/L (ref 22–32)
Calcium: 8.9 mg/dL (ref 8.9–10.3)
Chloride: 102 mmol/L (ref 98–111)
Creatinine, Ser: 0.54 mg/dL (ref 0.44–1.00)
GFR, Estimated: >60 mL/min (ref >60)
Glucose, Bld: 158 mg/dL — ABNORMAL HIGH (ref 70–99)
Potassium: 4.1 mmol/L (ref 3.5–5.1)
Sodium: 135 mmol/L (ref 135–145)

## 2021-03-28 LAB — CBC WITH DIFFERENTIAL/PLATELET
Abs Immature Granulocytes: 0.03 10*3/uL (ref 0.00–0.07)
Basophils Absolute: 0 10*3/uL (ref 0.0–0.1)
Basophils Relative: 0 %
Eosinophils Absolute: 0.1 10*3/uL (ref 0.0–0.5)
Eosinophils Relative: 1 %
HCT: 39.7 % (ref 36.0–46.0)
Hemoglobin: 12.7 g/dL (ref 12.0–15.0)
Immature Granulocytes: 0 %
Lymphocytes Relative: 22 %
Lymphs Abs: 1.8 10*3/uL (ref 0.7–4.0)
MCH: 29.2 pg (ref 26.0–34.0)
MCHC: 32 g/dL (ref 30.0–36.0)
MCV: 91.3 fL (ref 80.0–100.0)
Monocytes Absolute: 0.4 10*3/uL (ref 0.1–1.0)
Monocytes Relative: 5 %
Neutro Abs: 6.1 10*3/uL (ref 1.7–7.7)
Neutrophils Relative %: 72 %
Platelets: 266 10*3/uL (ref 150–400)
RBC: 4.35 MIL/uL (ref 3.87–5.11)
RDW: 14.3 % (ref 11.5–15.5)
WBC: 8.4 10*3/uL (ref 4.0–10.5)
nRBC: 0 % (ref 0.0–0.2)

## 2021-03-28 LAB — SEDIMENTATION RATE: Sed Rate: 30 mm/hr — ABNORMAL HIGH (ref 0–22)

## 2021-03-28 LAB — HCG, SERUM, QUALITATIVE: Preg, Serum: NEGATIVE

## 2021-03-28 IMAGING — CT CT ANGIO HEAD
2 of 11 series · 7 of 34 positions shown · IV contrast (Omnipaque or Isovue)
Comparison: None.

CLINICAL DATA: Neck pain with partial vision loss in the right eye.

EXAM:
CT ANGIOGRAPHY HEAD AND NECK
TECHNIQUE: Multidetector CT imaging of the head and neck was performed using
the standard protocol during bolus administration of intravenous
contrast. Multiplanar CT image reconstructions and MIPs were
obtained to evaluate the vascular anatomy. Carotid stenosis
measurements (when applicable) are obtained utilizing NASCET
criteria, using the distal internal carotid diameter as the
denominator.
CONTRAST:  100mL OMNIPAQUE IOHEXOL 350 MG/ML SOLN

[Series 10: cta head & neck · axial · 0.49mm/px · z∈[+1490,+1600]mm · 2 of 166 slices shown]
[im 56/166  soft-tissue]
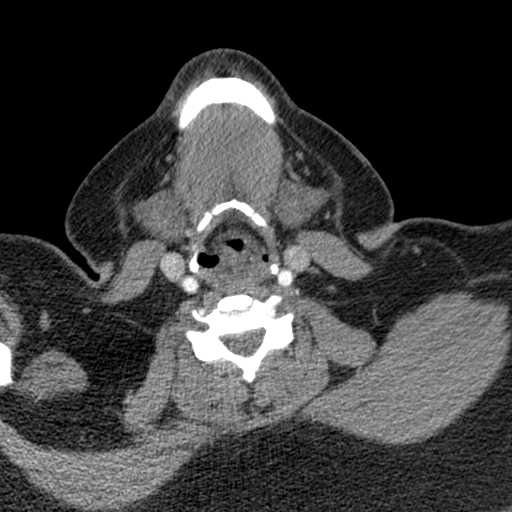
[im 111/166  soft-tissue]
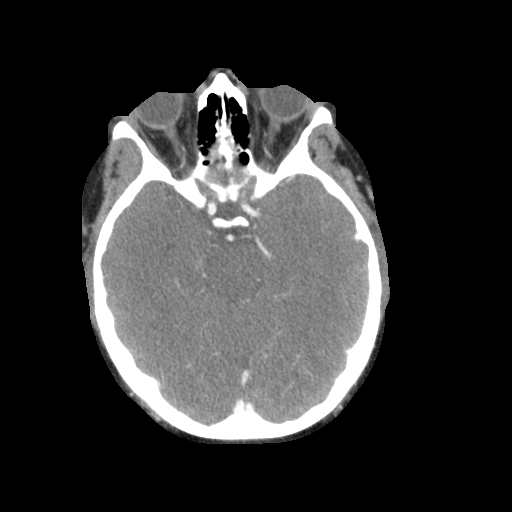

[Series 12: ax thins · axial · 0.47mm/px · z∈[+1437,+1655]mm · 5 of 328 slices shown]
[im 55/328  soft-tissue]
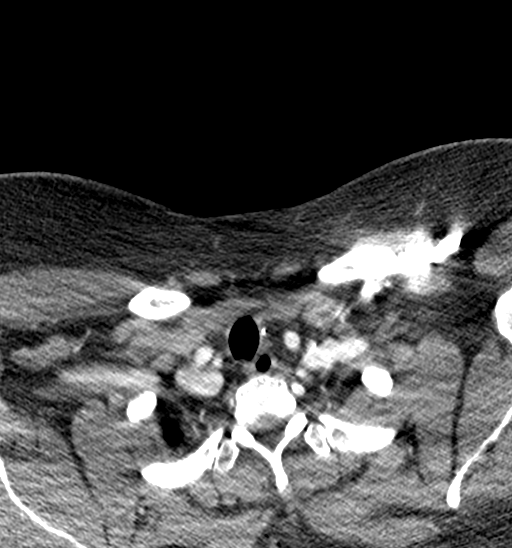
[im 110/328  bone]
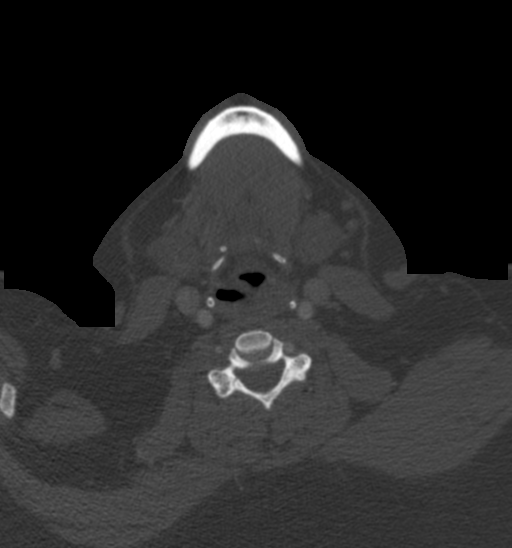
[im 164/328  soft-tissue]
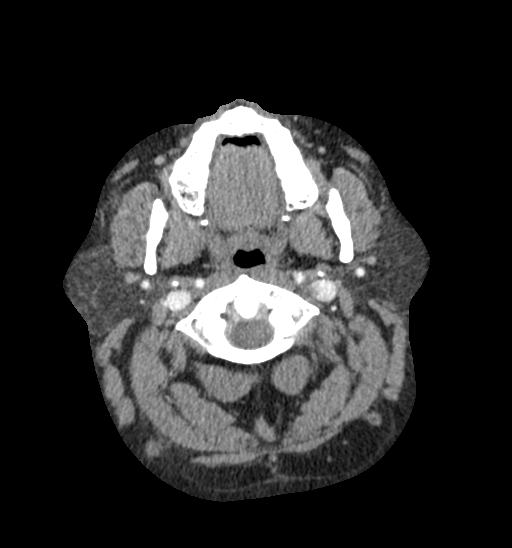
[im 219/328  bone]
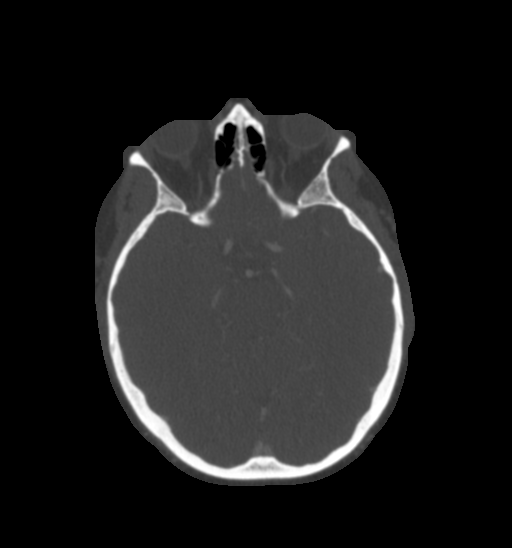
[im 273/328  soft-tissue]
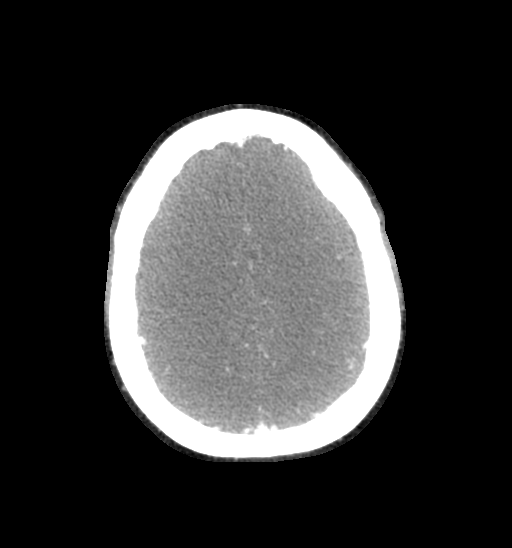

[7 of 34 positions shown; findings below may reference images not displayed]

FINDINGS: CT HEAD FINDINGS

Brain: There is no mass, hemorrhage or extra-axial collection. The
size and configuration of the ventricles and extra-axial CSF spaces
are normal. There is no acute or chronic infarction. The brain
parenchyma is normal.

Skull: The visualized skull base, calvarium and extracranial soft
tissues are normal.

Sinuses/Orbits: No fluid levels or advanced mucosal thickening of
the visualized paranasal sinuses. No mastoid or middle ear effusion.
The orbits are normal.

CTA NECK FINDINGS

SKELETON: There is no bony spinal canal stenosis. No lytic or
blastic lesion.

OTHER NECK: Normal pharynx, larynx and major salivary glands. No
cervical lymphadenopathy. Unremarkable thyroid gland.

UPPER CHEST: No pneumothorax or pleural effusion. No nodules or
masses.

AORTIC ARCH:

There is no calcific atherosclerosis of the aortic arch. There is no
aneurysm, dissection or hemodynamically significant stenosis of the
visualized portion of the aorta. Conventional 3 vessel aortic
branching pattern. The visualized proximal subclavian arteries are
widely patent.

RIGHT CAROTID SYSTEM: Normal without aneurysm, dissection or
stenosis.

LEFT CAROTID SYSTEM: Normal without aneurysm, dissection or
stenosis.

VERTEBRAL ARTERIES: Left dominant configuration. Both origins are
clearly patent. There is no dissection, occlusion or flow-limiting
stenosis to the skull base (V1-V3 segments).

CTA HEAD FINDINGS

POSTERIOR CIRCULATION:

--Vertebral arteries: Normal V4 segments.

--Inferior cerebellar arteries: Normal.

--Basilar artery: Normal.

--Superior cerebellar arteries: Normal.

--Posterior cerebral arteries (PCA): Normal.

ANTERIOR CIRCULATION:

--Intracranial internal carotid arteries: Normal.

--Anterior cerebral arteries (ACA): Normal. Both A1 segments are
present. Patent anterior communicating artery (a-comm).

--Middle cerebral arteries (MCA): Normal.

VENOUS SINUSES: As permitted by contrast timing, patent.

ANATOMIC VARIANTS: None

Review of the MIP images confirms the above findings.
IMPRESSION: Normal CTA of the head and neck.

## 2021-03-28 IMAGING — CT CT ANGIO NECK
2 of 12 series · 6 of 34 positions shown · IV contrast (Omnipaque or Isovue)
Comparison: None.

CLINICAL DATA: Neck pain with partial vision loss in the right eye.

EXAM:
CT ANGIOGRAPHY HEAD AND NECK
TECHNIQUE: Multidetector CT imaging of the head and neck was performed using
the standard protocol during bolus administration of intravenous
contrast. Multiplanar CT image reconstructions and MIPs were
obtained to evaluate the vascular anatomy. Carotid stenosis
measurements (when applicable) are obtained utilizing NASCET
criteria, using the distal internal carotid diameter as the
denominator.
CONTRAST:  100mL OMNIPAQUE IOHEXOL 350 MG/ML SOLN

[Series 11: cta head & neck · axial · 0.50mm/px · z∈[+1442,+1636]mm · 4 of 648 slices shown]
[im 130/648  soft-tissue]
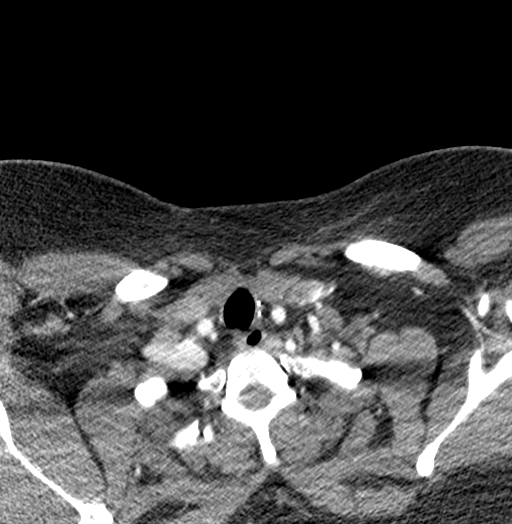
[im 259/648  bone]
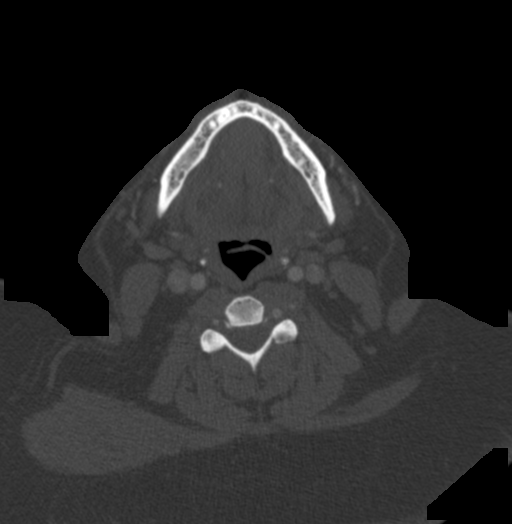
[im 389/648  soft-tissue]
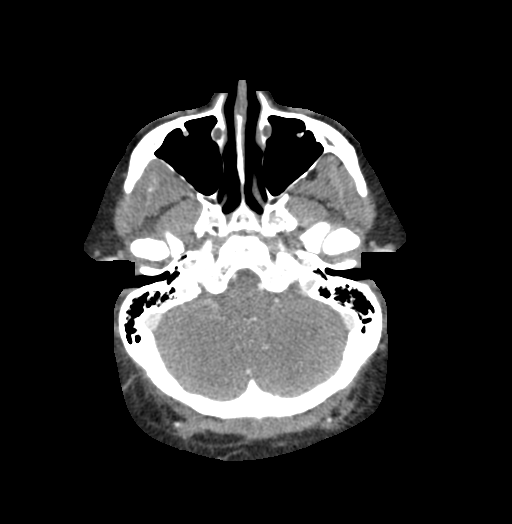
[im 518/648  bone]
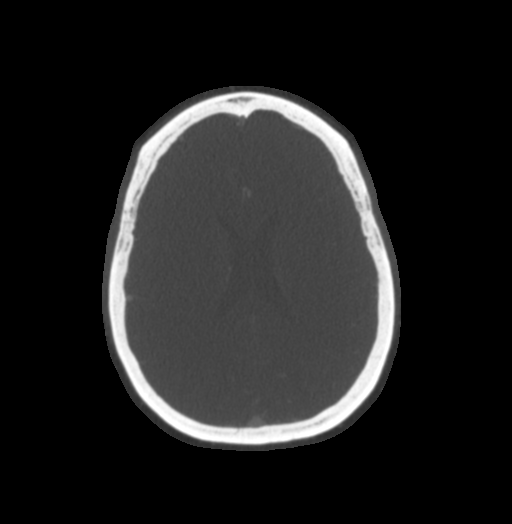

[Series 12: ax thins · axial · 0.47mm/px · z∈[+1492,+1601]mm · 2 of 328 slices shown]
[im 110/328  soft-tissue]
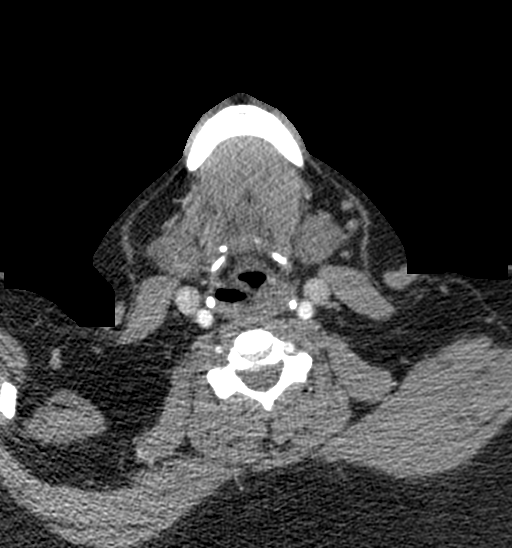
[im 219/328  soft-tissue]
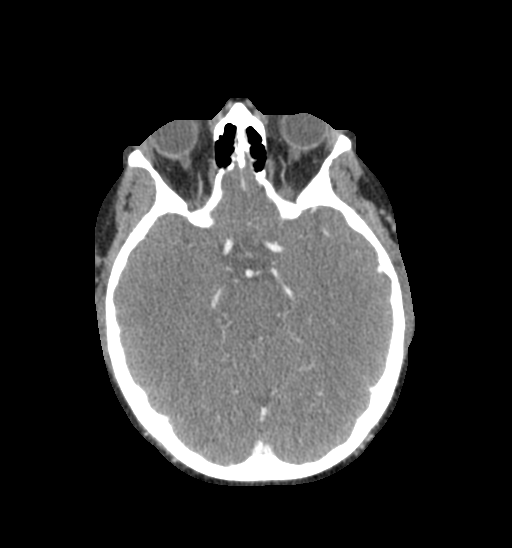

[6 of 34 positions shown; findings below may reference images not displayed]

FINDINGS: CT HEAD FINDINGS

Brain: There is no mass, hemorrhage or extra-axial collection. The
size and configuration of the ventricles and extra-axial CSF spaces
are normal. There is no acute or chronic infarction. The brain
parenchyma is normal.

Skull: The visualized skull base, calvarium and extracranial soft
tissues are normal.

Sinuses/Orbits: No fluid levels or advanced mucosal thickening of
the visualized paranasal sinuses. No mastoid or middle ear effusion.
The orbits are normal.

CTA NECK FINDINGS

SKELETON: There is no bony spinal canal stenosis. No lytic or
blastic lesion.

OTHER NECK: Normal pharynx, larynx and major salivary glands. No
cervical lymphadenopathy. Unremarkable thyroid gland.

UPPER CHEST: No pneumothorax or pleural effusion. No nodules or
masses.

AORTIC ARCH:

There is no calcific atherosclerosis of the aortic arch. There is no
aneurysm, dissection or hemodynamically significant stenosis of the
visualized portion of the aorta. Conventional 3 vessel aortic
branching pattern. The visualized proximal subclavian arteries are
widely patent.

RIGHT CAROTID SYSTEM: Normal without aneurysm, dissection or
stenosis.

LEFT CAROTID SYSTEM: Normal without aneurysm, dissection or
stenosis.

VERTEBRAL ARTERIES: Left dominant configuration. Both origins are
clearly patent. There is no dissection, occlusion or flow-limiting
stenosis to the skull base (V1-V3 segments).

CTA HEAD FINDINGS

POSTERIOR CIRCULATION:

--Vertebral arteries: Normal V4 segments.

--Inferior cerebellar arteries: Normal.

--Basilar artery: Normal.

--Superior cerebellar arteries: Normal.

--Posterior cerebral arteries (PCA): Normal.

ANTERIOR CIRCULATION:

--Intracranial internal carotid arteries: Normal.

--Anterior cerebral arteries (ACA): Normal. Both A1 segments are
present. Patent anterior communicating artery (a-comm).

--Middle cerebral arteries (MCA): Normal.

VENOUS SINUSES: As permitted by contrast timing, patent.

ANATOMIC VARIANTS: None

Review of the MIP images confirms the above findings.
IMPRESSION: Normal CTA of the head and neck.

## 2021-03-28 MED ORDER — IOHEXOL 350 MG/ML SOLN
100.0000 mL | Freq: Once | INTRAVENOUS | Status: AC | PRN
  Administered 2021-03-28: 100 mL via INTRAVENOUS

## 2021-03-28 NOTE — Discharge Instructions (Addendum)
Your testing is reassuring.  Follow-up with the ophthalmologist and neurologist.  No evidence of stroke though cannot be ruled out completely without an MRI which is not available tonight.  The neurologist favors migraine causing your visual changes.  Return to the ED with new or worsening symptoms.

## 2021-03-28 NOTE — Consult Note (Signed)
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 Given very typical description of the aura, which is very distinct from visual symptoms associated with strokes, and no other neurologic deficits, I feel very comfortable diagnosing this as migraine with aura and I don't think she needs to stay for further work-up. I recommended that she follow up outpatient with an ophthalmologist to ensure no ocular cause of the visual symptoms, and can follow up with her neurologist as well for migraine treatment if her headache recurs.   Disposition: Neurology will sign off. Reconsult if Needed   Metrics: [facility] Notification Time: 03/28/2021 01:13:46 Stamp Time: 03/28/2021 01:15:39 Callback Response Time: 03/28/2021 01:20:52   ----------------------------------------------------------------------------------------------------  Chief Complaint: visual changes, headache  History of Present Illness: Patient is a 45 year old Female.  Patient presents for episode of visual scotoma in the periphery of the right eye, starting with zigzags and expanding into a scotoma, lasted about 30 minutes, followed by headache. The headache lasted about 60 minutes at a more severe level, has a mild headache now, the visual changes are completely resolved. Denies any other new neurologic symptoms (has some paresthesias of the face but this is a longstanding chronic issue). She has no history of migraine, however her daughter has migraine with visual aura also. She reports she sees a neurologist for carpal tunnel syndrome and C-spine issues.    Past Medical History:      Hypertension        Examination: BP(148/86), Pulse(74), Blood Glucose(167)  Neuro  Exam: General: Alert,Awake, Oriented to Time, Place, Person  Speech: Fluent:  Language: Intact:  Face: Symmetric:  Facial Sensation: Intact:  Extraocular Movements: Intact:  Motor Exam: No Drift:  Coordination: Intact:  Unable to check sensation or visual fields as RN was unavailable to assist with exam     Patient / Family was informed the Neurology Consult would occur via TeleHealth consult by way of interactive audio and video telecommunications and consented to receiving care in this manner.  Patient is being evaluated for possible acute neurologic impairment and high probability of imminent or life - threatening deterioration.I spent total of 22 minutes providing care to this patient, including time for face to face visit via telemedicine, review of medical records, imaging studies and discussion of findings with providers, the patient and / or family.   Dr Eleonore Chiquito   [facility] [phone]  Case [id number]

## 2021-04-08 ENCOUNTER — Other Ambulatory Visit: Payer: Self-pay

## 2021-04-08 ENCOUNTER — Encounter (HOSPITAL_COMMUNITY): Payer: Self-pay | Admitting: *Deleted

## 2021-04-08 ENCOUNTER — Emergency Department (HOSPITAL_COMMUNITY)
Admission: EM | Admit: 2021-04-08 | Discharge: 2021-04-08 | Disposition: A | Discharge status: Home / Self Care | Payer: Medicaid Other | Attending: Emergency Medicine | Admitting: Emergency Medicine

## 2021-04-08 DIAGNOSIS — R030 Elevated blood-pressure reading, without diagnosis of hypertension: Secondary | ICD-10-CM | POA: Diagnosis not present

## 2021-04-08 DIAGNOSIS — R519 Headache, unspecified: Secondary | ICD-10-CM | POA: Diagnosis not present

## 2021-04-08 DIAGNOSIS — Z139 Encounter for screening, unspecified: Secondary | ICD-10-CM

## 2021-04-08 DIAGNOSIS — E039 Hypothyroidism, unspecified: Secondary | ICD-10-CM | POA: Diagnosis not present

## 2021-04-08 DIAGNOSIS — Z87891 Personal history of nicotine dependence: Secondary | ICD-10-CM | POA: Insufficient documentation

## 2021-04-08 DIAGNOSIS — Z79899 Other long term (current) drug therapy: Secondary | ICD-10-CM | POA: Insufficient documentation

## 2021-04-08 LAB — BASIC METABOLIC PANEL
Anion gap: 9 (ref 5–15)
BUN: 9 mg/dL (ref 6–20)
CO2: 23 mmol/L (ref 22–32)
Calcium: 9 mg/dL (ref 8.9–10.3)
Chloride: 102 mmol/L (ref 98–111)
Creatinine, Ser: 0.63 mg/dL (ref 0.44–1.00)
GFR, Estimated: >60 mL/min (ref >60)
Glucose, Bld: 204 mg/dL — ABNORMAL HIGH (ref 70–99)
Potassium: 3.7 mmol/L (ref 3.5–5.1)
Sodium: 134 mmol/L — ABNORMAL LOW (ref 135–145)

## 2021-04-08 LAB — CBC
HCT: 39.1 % (ref 36.0–46.0)
Hemoglobin: 12.7 g/dL (ref 12.0–15.0)
MCH: 28.9 pg (ref 26.0–34.0)
MCHC: 32.5 g/dL (ref 30.0–36.0)
MCV: 89.1 fL (ref 80.0–100.0)
Platelets: 288 10*3/uL (ref 150–400)
RBC: 4.39 MIL/uL (ref 3.87–5.11)
RDW: 14.1 % (ref 11.5–15.5)
WBC: 10.8 10*3/uL — ABNORMAL HIGH (ref 4.0–10.5)
nRBC: 0 % (ref 0.0–0.2)

## 2021-04-08 NOTE — ED Triage Notes (Signed)
States she is here to get her electrolytes checked

## 2021-04-08 NOTE — ED Provider Notes (Signed)
[facility] EMERGENCY DEPARTMENT Provider Note   CSN: [id number] Arrival date & time: 04/08/21  1607     History Chief Complaint  Patient presents with   Hypertension    He[facility] is a 45 y.o. female.   Hypertension Associated symptoms include headaches. Pertinent negatives include no chest pain and no shortness of breath.       [facility] is a 45 y.o. female with past history of peripheral neuropathy, Graves' disease, GERD, and anxiety who presents to the Emergency Department requesting to have her electrolytes checked.  She was seen here at the end of June for an ocular migraine.  She followed up with her neurologist and was given a steroid injection.  Since that time, she has noticed an elevated heart rate and blood pressure.  She states her symptoms have been gradually improving, but today she reports having frontal headache and checked her blood pressure earlier and states it was elevated.  She was concerned that her blood sugar may be too high.  She is currently managing her blood sugars with diet.  She denies any other new medications, recent illness, shortness of breath fever or chills.   Past Medical History:  Diagnosis Date   Anxiety    Arthritis    Depression    Diverticulosis    GERD (gastroesophageal reflux disease)    Graves disease    Internal hemorrhoids    Thyroid disease    Uterine fibroid     Patient Active Problem List   Diagnosis Date Noted   Weight gain 12/20/2020   Diarrhea 05/08/2020   Idiopathic peripheral neuropathy 04/18/2020   Myelopathy ([place]) 04/18/2020   OSA on CPAP 04/18/2020   RLS (restless legs syndrome) 04/18/2020   Sensory disturbance 01/20/2020   Atypical chest pain 01/20/2020   Obesity, Class III, BMI 40-49.9 (morbid obesity) ([place]) 01/20/2020   Weakness 01/19/2020   Hyperglycemia 10/04/2019   Myalgia 09/05/2019   Vitamin B 12 deficiency 07/28/2019   Right sided numbness 06/29/2019   Constipation 06/15/2019    Elevated lipase 06/15/2019   Hypomagnesemia 03/29/2019   Leukocytosis 03/29/2019   Abdominal pain 02/10/2019   Nausea without vomiting 02/10/2019   Tingling of face 11/30/2018   Hot flashes 11/30/2018   Vitamin D deficiency 10/06/2018   GERD (gastroesophageal reflux disease) 01/28/2017   Rectal bleeding 01/28/2017   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   ABDOMINAL HYSTERECTOMY     CHOLECYSTECTOMY N/A 11/06/2014   Procedure: LAPAROSCOPIC CHOLECYSTECTOMY;  Surgeon: Jamesetta So, MD;  Location: [facility];  Service: General;  Laterality: N/A;   COLONOSCOPY N/A 03/04/2017   Dr. Gala Romney: Hemorrhoids, diverticulosis, benign polyp without adenomatous changes.  Next colonoscopy 10 years   DILATION AND CURETTAGE OF UTERUS     ESOPHAGOGASTRODUODENOSCOPY N/A 10/02/2014   Dr. Gala Romney: fundic gland polyps, hiatal hernia   MOUTH SURGERY     extraction of teeth   POLYPECTOMY  03/04/2017   Procedure: POLYPECTOMY;  Surgeon: Daneil Dolin, MD;  Location: [facility];  Service: Endoscopy;;  colon   THYROIDECTOMY N/A 09/30/2018   Procedure: TOTAL THYROIDECTOMY;  Surgeon: Armandina Gemma, MD;  Location: [facility];  Service: General;  Laterality: N/A;   TUBAL LIGATION  OB History     Gravida  2   Para  2   Term  2   Preterm      AB      Living  2      SAB      IAB      Ectopic      Multiple      Live Births              Family History  Problem Relation Age of Onset   Hypertension Father    Diabetes Father    Depression Other    Uterine cancer Mother        ?cervical cancer   Uterine cancer Sister        suicide   Colon cancer Paternal Aunt 68   Thyroid disease Neg Hx     Social History   Tobacco Use   Smoking status: Former     Packs/day: 1.00    Years: 5.00    Pack years: 5.00    Types: Cigarettes    Quit date: 11/03/1997    Years since quitting: 23.4   Smokeless tobacco: Never   Tobacco comments:    1 pack 1 week  Vaping Use   Vaping Use: Never used  Substance Use Topics   Alcohol use: No    Alcohol/week: 0.0 standard drinks   Drug use: No    Home Medications Prior to Admission medications   Medication Sig Start Date End Date Taking? Authorizing Provider  acetaminophen (TYLENOL) 500 MG tablet Take 650 mg by mouth daily as needed for moderate pain.    [provider]  acetaminophen (TYLENOL) 650 MG CR tablet Take 650 mg by mouth every 8 (eight) hours as needed for pain.    [provider]  albuterol (VENTOLIN HFA) 108 (90 Base) MCG/ACT inhaler Inhale 2 puffs into the lungs every 6 (six) hours as needed. 12/13/19   Julian Hy, DO  budesonide-formoterol (SYMBICORT) 160-4.5 MCG/ACT inhaler Inhale 2 puffs into the lungs in the morning and at bedtime. 12/13/19   Julian Hy, DO  cetirizine (ZYRTEC) 10 MG tablet Take 10 mg by mouth daily.    [provider]  Cholecalciferol (VITAMIN D3) 125 MCG (5000 UT) CAPS Take 1 capsule (5,000 Units total) by mouth daily. 10/13/19   Renato Shin, MD  Continuous Blood Gluc Sensor (FREESTYLE LIBRE 2 SENSOR) MISC 1 Device by Does not apply route every 14 (fourteen) days. 12/27/20   Renato Shin, MD  EPINEPHrine 0.3 mg/0.3 mL IJ SOAJ injection Inject 0.3 mg into the muscle as needed. 08/10/20   [provider]  escitalopram (LEXAPRO) 20 MG tablet Take 1 tablet (20 mg total) by mouth at bedtime. 01/26/19   Renato Shin, MD  fluticasone (FLONASE) 50 MCG/ACT nasal spray Place 2 sprays into both nostrils daily.    [provider]  LIDOCAINE-MENTHOL ROLL-ON EX Apply 1 application topically daily as needed (pain).    [provider]  losartan (COZAAR) 25 MG tablet Take 1 tablet (25 mg total) by mouth daily. Patient not taking: No  sig reported 05/31/20 08/29/20  Donato Heinz, MD  Magnesium 250 MG TABS Take 250 mg by mouth at bedtime.     [provider]  MELATONIN PO Take 2.5 mg by mouth at bedtime.    [provider]  montelukast (SINGULAIR) 10 MG tablet TAKE 1 TABLET BY MOUTH AT BEDTIME 12/24/20   Julian Hy, DO  NON FORMULARY at  bedtime. CPAP at night    [provider]  omeprazole (PRILOSEC) 20 MG capsule Take 20 mg by mouth 2 (two) times daily before a meal.    [provider]  pantoprazole (PROTONIX) 40 MG tablet Take 40 mg by mouth 2 (two) times daily. 08/20/20   [provider]  potassium chloride (KLOR-CON) 10 MEQ tablet Take 10 mEq by mouth daily. 06/22/20   [provider]  PULMICORT FLEXHALER 180 MCG/ACT inhaler 1 puff 2 (two) times daily. 01/01/21   [provider]  RESTASIS 0.05 % ophthalmic emulsion Place 1 drop into both eyes 2 (two) times daily. 12/26/19   [provider]  simethicone (MYLICON) 701 MG chewable tablet Chew 125-250 mg by mouth every 6 (six) hours as needed for flatulence.     [provider]  sucralfate (CARAFATE) 1 g tablet Take 1 g by mouth daily. 08/20/20   [provider]  SYNTHROID 175 MCG tablet Take 1 tablet (175 mcg total) by mouth daily before breakfast. 01/01/21   Renato Shin, MD  Tiotropium Bromide Monohydrate (SPIRIVA RESPIMAT) 1.25 MCG/ACT AERS Inhale 2 puffs into the lungs daily. 01/05/20   Julian Hy, DO  Wheat Dextrin (BENEFIBER DRINK MIX PO) Take 1 Dose by mouth 3 (three) times daily.     [provider]    Allergies    Levofloxacin, Toradol [ketorolac tromethamine], Tramadol, Other, Chamomile, Hctz [hydrochlorothiazide], Lisinopril, Losartan potassium, and Penicillins  Review of Systems   Review of Systems  Constitutional:  Negative for chills and fever.  Respiratory:  Negative for shortness of breath.   Cardiovascular:  Negative for chest pain, palpitations and  leg swelling.  Gastrointestinal:  Negative for nausea and vomiting.  Genitourinary:  Negative for dysuria.  Musculoskeletal:  Negative for arthralgias, back pain, myalgias, neck pain and neck stiffness.  Skin:  Negative for rash.  Neurological:  Positive for headaches. Negative for dizziness, syncope, speech difficulty, weakness and numbness.  Hematological:  Does not bruise/bleed easily.   Physical Exam Updated Vital Signs BP (!) 143/87   Pulse 87   Temp 98.4 F (36.9 C) (Oral)   Resp 18   Ht 5\' 4"  (1.626 m)   Wt 121.6 kg   LMP 10/09/2014   SpO2 99%   BMI 46.00 kg/m   Physical Exam Vitals and nursing note reviewed.  Constitutional:      Appearance: Normal appearance. She is not ill-appearing.  Cardiovascular:     Rate and Rhythm: Normal rate and regular rhythm.     Pulses: Normal pulses.  Pulmonary:     Effort: Pulmonary effort is normal.  Abdominal:     Palpations: Abdomen is soft.     Tenderness: There is no abdominal tenderness.  Musculoskeletal:        General: Normal range of motion.  Skin:    General: Skin is warm.     Capillary Refill: Capillary refill takes less than 2 seconds.  Neurological:     General: No focal deficit present.     Mental Status: She is alert.     Sensory: Sensation is intact. No sensory deficit.     Motor: Motor function is intact. No weakness.     Coordination: Coordination is intact.     Comments: CN III-XII grossly intact.  Speech clear.     ED Results / Procedures / Treatments   Labs (all labs ordered are listed, but only abnormal results are displayed) Labs Reviewed  BASIC METABOLIC PANEL - Abnormal; Notable for  the following components:      Result Value   Sodium 134 (*)    Glucose, Bld 204 (*)    All other components within normal limits  CBC - Abnormal; Notable for the following components:   WBC 10.8 (*)    All other components within normal limits    EKG None  Radiology No results  found.  Procedures Procedures   Medications Ordered in ED Medications - No data to display  ED Course  I have reviewed the triage vital signs and the nursing notes.  Pertinent labs & imaging results that were available during my care of the patient were reviewed by me and considered in my medical decision making (see chart for details).    MDM Rules/Calculators/A&P                          Patient here requesting evaluation of her electrolytes.  She was recently given a steroid injection for an ocular migraine and notes that since receiving the steroid injection that she has been having elevated heart rate and blood pressure at home.  Steroid injection was 6 days ago.  She was concerned that her blood sugar and blood pressure may be elevated.  On exam, she is well-appearing.  No focal neurodeficits.  Blood pressure here is reassuring.  No tachycardia or tachypnea.  No hypoxia.  Blood glucose 204.  States that she is managing her blood sugars with diet.  Electrolytes without significant abnormalities.  BP mildly elevated here.  No evidence of end organ damage.  Patient reassured.  She appears appropriate for discharge home and agreeable to close outpatient follow-up with PCP, no indication for emergent BP control here.   Return precautions were discussed.   Final Clinical Impression(s) / ED Diagnoses Final diagnoses:  Encounter for medical screening examination  Elevated blood pressure reading    Rx / DC Orders ED Discharge Orders     None        Kem Parkinson, PA-C 04/08/21 1829    Daleen Bo, MD 04/10/21 1253

## 2021-04-08 NOTE — Discharge Instructions (Addendum)
Your blood pressure today was mildly elevated.  You may want to continue to monitor your blood pressure at home.  Call your primary care provider to arrange a follow-up appointment for recheck.  Return emergency department for any new or worsening symptoms.

## 2021-04-09 ENCOUNTER — Ambulatory Visit (INDEPENDENT_AMBULATORY_CARE_PROVIDER_SITE_OTHER): Payer: 59 | Admitting: Bariatrics

## 2021-04-12 ENCOUNTER — Encounter (HOSPITAL_COMMUNITY): Payer: Self-pay | Admitting: *Deleted

## 2021-04-12 ENCOUNTER — Emergency Department (HOSPITAL_COMMUNITY)
Admission: EM | Admit: 2021-04-12 | Discharge: 2021-04-12 | Disposition: A | Discharge status: Home / Self Care | Payer: Medicaid Other | Attending: Emergency Medicine | Admitting: Emergency Medicine

## 2021-04-12 ENCOUNTER — Other Ambulatory Visit: Payer: Self-pay

## 2021-04-12 DIAGNOSIS — R197 Diarrhea, unspecified: Secondary | ICD-10-CM | POA: Insufficient documentation

## 2021-04-12 DIAGNOSIS — Z87891 Personal history of nicotine dependence: Secondary | ICD-10-CM | POA: Insufficient documentation

## 2021-04-12 DIAGNOSIS — K219 Gastro-esophageal reflux disease without esophagitis: Secondary | ICD-10-CM | POA: Insufficient documentation

## 2021-04-12 DIAGNOSIS — R1084 Generalized abdominal pain: Secondary | ICD-10-CM | POA: Diagnosis not present

## 2021-04-12 DIAGNOSIS — R109 Unspecified abdominal pain: Secondary | ICD-10-CM | POA: Diagnosis present

## 2021-04-12 LAB — URINALYSIS, ROUTINE W REFLEX MICROSCOPIC
Bilirubin Urine: NEGATIVE
Glucose, UA: NEGATIVE mg/dL
Ketones, ur: NEGATIVE mg/dL
Leukocytes,Ua: NEGATIVE
Nitrite: NEGATIVE
Protein, ur: NEGATIVE mg/dL
Specific Gravity, Urine: 1.013 (ref 1.005–1.030)
pH: 6 (ref 5.0–8.0)

## 2021-04-12 LAB — CBC WITH DIFFERENTIAL/PLATELET
Abs Immature Granulocytes: 0.04 10*3/uL (ref 0.00–0.07)
Basophils Absolute: 0 10*3/uL (ref 0.0–0.1)
Basophils Relative: 0 %
Eosinophils Absolute: 0.1 10*3/uL (ref 0.0–0.5)
Eosinophils Relative: 1 %
HCT: 38 % (ref 36.0–46.0)
Hemoglobin: 12.4 g/dL (ref 12.0–15.0)
Immature Granulocytes: 0 %
Lymphocytes Relative: 19 %
Lymphs Abs: 2.2 10*3/uL (ref 0.7–4.0)
MCH: 29.3 pg (ref 26.0–34.0)
MCHC: 32.6 g/dL (ref 30.0–36.0)
MCV: 89.8 fL (ref 80.0–100.0)
Monocytes Absolute: 0.5 10*3/uL (ref 0.1–1.0)
Monocytes Relative: 5 %
Neutro Abs: 8.6 10*3/uL — ABNORMAL HIGH (ref 1.7–7.7)
Neutrophils Relative %: 75 %
Platelets: 279 10*3/uL (ref 150–400)
RBC: 4.23 MIL/uL (ref 3.87–5.11)
RDW: 14.4 % (ref 11.5–15.5)
WBC: 11.5 10*3/uL — ABNORMAL HIGH (ref 4.0–10.5)
nRBC: 0 % (ref 0.0–0.2)

## 2021-04-12 LAB — COMPREHENSIVE METABOLIC PANEL
ALT: 32 U/L (ref 0–44)
AST: 32 U/L (ref 15–41)
Albumin: 3.8 g/dL (ref 3.5–5.0)
Alkaline Phosphatase: 51 U/L (ref 38–126)
Anion gap: 8 (ref 5–15)
BUN: 6 mg/dL (ref 6–20)
CO2: 25 mmol/L (ref 22–32)
Calcium: 8.7 mg/dL — ABNORMAL LOW (ref 8.9–10.3)
Chloride: 103 mmol/L (ref 98–111)
Creatinine, Ser: 0.56 mg/dL (ref 0.44–1.00)
GFR, Estimated: >60 mL/min (ref >60)
Glucose, Bld: 106 mg/dL — ABNORMAL HIGH (ref 70–99)
Potassium: 3.6 mmol/L (ref 3.5–5.1)
Sodium: 136 mmol/L (ref 135–145)
Total Bilirubin: 0.4 mg/dL (ref 0.3–1.2)
Total Protein: 7.4 g/dL (ref 6.5–8.1)

## 2021-04-12 LAB — LIPASE, BLOOD: Lipase: 46 U/L (ref 11–51)

## 2021-04-12 MED ORDER — DICYCLOMINE HCL 10 MG PO CAPS
10.0000 mg | ORAL_CAPSULE | Freq: Once | ORAL | Status: AC
  Administered 2021-04-12: 10 mg via ORAL
  Filled 2021-04-12: qty 1

## 2021-04-12 MED ORDER — DICYCLOMINE HCL 20 MG PO TABS: 20.0000 mg | ORAL_TABLET | Freq: Two times a day (BID) | ORAL | 0 refills | Status: DC

## 2021-04-12 NOTE — ED Triage Notes (Signed)
Abdominal pain

## 2021-04-12 NOTE — ED Provider Notes (Signed)
[facility] EMERGENCY DEPARTMENT Provider Note  CSN: [id number] Arrival date & time: 04/12/21 1722    History Chief Complaint  Patient presents with   Abdominal Pain    Heather Fry is a 45 y.o. female reports she has had several days of cramping abdominal pain and watery diarrhea. She also was recently diagnosed with ocular migraines and got a steroid injection for PCP office. She was at the hospital in [place] 2 days ago for same. Had labs showing a mildly elevated lipase (similar to previous) was given Zofran and advised to take a probiotic (which she states she cannot take). Symptoms persisted today so she came to this ED for evaluation.    Past Medical History:  Diagnosis Date   Anxiety    Arthritis    Depression    Diverticulosis    GERD (gastroesophageal reflux disease)    Graves disease    Internal hemorrhoids    Thyroid disease    Uterine fibroid     Past Surgical History:  Procedure Laterality Date   ABDOMINAL HYSTERECTOMY     Per patient, uterine fibroids.   ABDOMINAL HYSTERECTOMY     CHOLECYSTECTOMY N/A 11/06/2014   Procedure: LAPAROSCOPIC CHOLECYSTECTOMY;  Surgeon: Jamesetta So, MD;  Location: [facility];  Service: General;  Laterality: N/A;   COLONOSCOPY N/A 03/04/2017   Dr. Gala Romney: Hemorrhoids, diverticulosis, benign polyp without adenomatous changes.  Next colonoscopy 10 years   DILATION AND CURETTAGE OF UTERUS     ESOPHAGOGASTRODUODENOSCOPY N/A 10/02/2014   Dr. Gala Romney: fundic gland polyps, hiatal hernia   MOUTH SURGERY     extraction of teeth   POLYPECTOMY  03/04/2017   Procedure: POLYPECTOMY;  Surgeon: Daneil Dolin, MD;  Location: [facility];  Service: Endoscopy;;  colon   THYROIDECTOMY N/A 09/30/2018   Procedure: TOTAL THYROIDECTOMY;  Surgeon: Armandina Gemma, MD;  Location: [facility];  Service: General;  Laterality: N/A;   TUBAL LIGATION      Family History  Problem Relation Age of Onset   Hypertension Father    Diabetes Father    Depression Other     Uterine cancer Mother        ?cervical cancer   Uterine cancer Sister        suicide   Colon cancer Paternal Aunt 82   Thyroid disease Neg Hx     Social History   Tobacco Use   Smoking status: Former    Packs/day: 1.00    Years: 5.00    Pack years: 5.00    Types: Cigarettes    Quit date: 11/03/1997    Years since quitting: 23.4   Smokeless tobacco: Never   Tobacco comments:    1 pack 1 week  Vaping Use   Vaping Use: Never used  Substance Use Topics   Alcohol use: No    Alcohol/week: 0.0 standard drinks   Drug use: No     Home Medications Prior to Admission medications   Medication Sig Start Date End Date Taking? Authorizing Provider  acetaminophen (TYLENOL) 500 MG tablet Take 1,000 mg by mouth daily as needed for moderate pain.   Yes [provider]  albuterol (VENTOLIN HFA) 108 (90 Base) MCG/ACT inhaler Inhale 2 puffs into the lungs every 6 (six) hours as needed. 12/13/19  Yes Julian Hy, DO  cetirizine (ZYRTEC) 10 MG tablet Take 10 mg by mouth daily.   Yes [provider]  Cholecalciferol (VITAMIN D3) 125 MCG (5000 UT) CAPS Take 1 capsule (5,000  Units total) by mouth daily. Patient taking differently: Take 10,000 Units by mouth daily. 10/13/19  Yes Renato Shin, MD  dicyclomine (BENTYL) 20 MG tablet Take 1 tablet (20 mg total) by mouth 2 (two) times daily. 04/12/21  Yes Truddie Hidden, MD  EPINEPHrine 0.3 mg/0.3 mL IJ SOAJ injection Inject 0.3 mg into the muscle as needed. 08/10/20  Yes [provider]  escitalopram (LEXAPRO) 20 MG tablet Take 1 tablet (20 mg total) by mouth at bedtime. 01/26/19  Yes Renato Shin, MD  fluticasone [facility]) 50 MCG/ACT nasal spray Place 2 sprays into both nostrils daily.   Yes [provider]  HYDROcodone-acetaminophen (NORCO/VICODIN) 5-325 MG tablet Take 1 tablet by mouth every 6 (six) hours as needed. 03/15/21  Yes [provider]  ipratropium (ATROVENT) 0.02 % nebulizer solution Take 0.5  mg by nebulization every 6 (six) hours as needed for wheezing or shortness of breath.   Yes [provider]  LIDOCAINE-MENTHOL ROLL-ON EX Apply 1 application topically daily as needed (pain).   Yes [provider]  Magnesium 250 MG TABS Take 250 mg by mouth at bedtime.    Yes [provider]  NON FORMULARY at bedtime. CPAP at night   Yes [provider]  nystatin cream (MYCOSTATIN) Apply topically 3 (three) times daily. 03/15/21  Yes [provider]  omeprazole (PRILOSEC) 20 MG capsule Take 20 mg by mouth 2 (two) times daily before a meal.   Yes [provider]  ondansetron (ZOFRAN-ODT) 4 MG disintegrating tablet Take by mouth. 04/11/21  Yes [provider]  pantoprazole (PROTONIX) 40 MG tablet Take 40 mg by mouth 2 (two) times daily. 08/20/20  Yes [provider]  RESTASIS 0.05 % ophthalmic emulsion Place 1 drop into both eyes 2 (two) times daily. 12/26/19  Yes [provider]  simethicone (MYLICON) 629 MG chewable tablet Chew 125-250 mg by mouth every 6 (six) hours as needed for flatulence.    Yes [provider]  sucralfate (CARAFATE) 1 g tablet Take 1 g by mouth daily. 08/20/20  Yes [provider]  SYNTHROID 150 MCG tablet Take 150 mcg by mouth daily. 04/06/21  Yes [provider]  acetaminophen (TYLENOL) 650 MG CR tablet Take 650 mg by mouth every 8 (eight) hours as needed for pain. Patient not taking: No sig reported    [provider]  budesonide-formoterol (SYMBICORT) 160-4.5 MCG/ACT inhaler Inhale 2 puffs into the lungs in the morning and at bedtime. Patient not taking: No sig reported 12/13/19   Noemi Chapel P, DO  Continuous Blood Gluc Sensor (FREESTYLE LIBRE 2 SENSOR) MISC 1 Device by Does not apply route every 14 (fourteen) days. 12/27/20   Renato Shin, MD  losartan (COZAAR) 25 MG tablet Take 1 tablet (25 mg total) by mouth daily. Patient not taking: No sig reported 05/31/20  08/29/20  Donato Heinz, MD  MELATONIN PO Take 2.5 mg by mouth at bedtime. Patient not taking: No sig reported    [provider]  montelukast (SINGULAIR) 10 MG tablet TAKE 1 TABLET BY MOUTH AT BEDTIME Patient not taking: Reported on 04/12/2021 12/24/20   Noemi Chapel P, DO  potassium chloride (KLOR-CON) 10 MEQ tablet Take 10 mEq by mouth daily. Patient not taking: Reported on 04/12/2021 06/22/20   [provider]  PULMICORT FLEXHALER 180 MCG/ACT inhaler 1 puff 2 (two) times daily. Patient not taking: Reported on 04/12/2021 01/01/21   [provider]  SYNTHROID 175 MCG tablet Take 1 tablet (175  mcg total) by mouth daily before breakfast. Patient not taking: Reported on 04/12/2021 01/01/21   Renato Shin, MD  terbinafine (LAMISIL) 250 MG tablet Take 250 mg by mouth daily. 03/15/21   [provider]  Tiotropium Bromide Monohydrate (SPIRIVA RESPIMAT) 1.25 MCG/ACT AERS Inhale 2 puffs into the lungs daily. Patient not taking: Reported on 04/12/2021 01/05/20   Noemi Chapel P, DO  Wheat Dextrin (BENEFIBER DRINK MIX PO) Take 1 Dose by mouth 3 (three) times daily.  Patient not taking: Reported on 04/12/2021    [provider]     Allergies    Levofloxacin, Toradol [ketorolac tromethamine], Tramadol, Other, Chamomile, Hctz [hydrochlorothiazide], Lisinopril, Losartan potassium, and Penicillins   Review of Systems   Review of Systems A comprehensive review of systems was completed and negative except as noted in HPI.    Physical Exam BP (!) 152/90   Pulse 87   Temp 98.9 F (37.2 C) (Oral)   Resp (!) 23   LMP 10/09/2014   SpO2 98%   Physical Exam Vitals and nursing note reviewed.  Constitutional:      Appearance: Normal appearance.  HENT:     Head: Normocephalic and atraumatic.     Nose: Nose normal.     Mouth/Throat:     Mouth: Mucous membranes are moist.  Eyes:     Extraocular Movements: Extraocular movements intact.     Conjunctiva/sclera:  Conjunctivae normal.  Cardiovascular:     Rate and Rhythm: Normal rate.  Pulmonary:     Effort: Pulmonary effort is normal.     Breath sounds: Normal breath sounds.  Abdominal:     General: Abdomen is flat.     Palpations: Abdomen is soft.     Tenderness: There is generalized abdominal tenderness. There is no guarding. Negative signs include Murphy's sign and McBurney's sign.  Musculoskeletal:        General: No swelling. Normal range of motion.     Cervical back: Neck supple.  Skin:    General: Skin is warm and dry.  Neurological:     General: No focal deficit present.     Mental Status: She is alert.  Psychiatric:        Mood and Affect: Mood normal.     ED Results / Procedures / Treatments   Labs (all labs ordered are listed, but only abnormal results are displayed) Labs Reviewed  COMPREHENSIVE METABOLIC PANEL - Abnormal; Notable for the following components:      Result Value   Glucose, Bld 106 (*)    Calcium 8.7 (*)    All other components within normal limits  CBC WITH DIFFERENTIAL/PLATELET - Abnormal; Notable for the following components:   WBC 11.5 (*)    Neutro Abs 8.6 (*)    All other components within normal limits  URINALYSIS, ROUTINE W REFLEX MICROSCOPIC - Abnormal; Notable for the following components:   Hgb urine dipstick SMALL (*)    Bacteria, UA RARE (*)    All other components within normal limits  LIPASE, BLOOD    EKG None  Radiology No results found.  Procedures Procedures  Medications Ordered in the ED Medications  dicyclomine (BENTYL) capsule 10 mg (10 mg Oral Given 04/12/21 2104)     MDM Rules/Calculators/A&P MDM Mild diffuse tenderness without guarding or other peritoneal signs. Patient's lipase has been ~200 the last several visits for similar symptoms, doubt this is acute pancreatitis. Will check labs to ensure no change in elytes. Bentyl for cramping pain.   ED  Course  I have reviewed the triage vital signs and the nursing  notes.  Pertinent labs & imaging results that were available during my care of the patient were reviewed by me and considered in my medical decision making (see chart for details).  Clinical Course as of 04/12/21 2233  Fri Apr 12, 2021  2037 CBC with improved WBC from recent ED visit, CMP and Lipase are normal.  [CS]  2232 Patient reports good improvement in symptoms with bentyl. Requesting Rx for same. Otherwise abdomen remains benign and ready for discharge.  [CS]    Clinical Course User Index [CS] Truddie Hidden, MD    Final Clinical Impression(s) / ED Diagnoses Final diagnoses:  Generalized abdominal pain  Diarrhea, unspecified type    Rx / DC Orders ED Discharge Orders          Ordered    dicyclomine (BENTYL) 20 MG tablet  2 times daily        04/12/21 2233             Truddie Hidden, MD 04/12/21 2233

## 2021-04-16 ENCOUNTER — Encounter: Payer: Self-pay | Admitting: Gastroenterology

## 2021-04-16 NOTE — Progress Notes (Signed)
Referring Provider: Redmond School, MD Primary Care Physician:  Redmond School, MD Primary GI Physician: Dr. Gala Romney  Chief Complaint  Patient presents with   Irritable Bowel Syndrome    Had diarrhea episodes last week but calmed down now     HPI:   Heather Fry is a 45 y.o. female with history of Graves' disease (s/p thyroidectomy 2020), OSA (on CPAP), HLD, GERD, IBS, abdominal pain who presents today for further evaluation of flares of abdominal pain and diarrhea.   Patient was last seen in our office in August 2021. She also started following with [facility] in 2021.   Patient [facility] on 01/16/2021 for follow-up of abdominal pain.  Noted patient had been seen in the ED on 3 occasions over the last month.  Evaluation in April 2022 with associated elevated lipase to 213 though CT with no concerning pancreatic findings, hepatic steatosis.  Represented to ED on 4/15 and lipase normal at that time.  At the time of her office visit, she reported her abdominal pain had resolved.  She attributed these refractory symptoms to thyroid issues after getting injections for osteoarthritis.  Stated she has significant GI symptoms anytime she becomes hyperthyroid with worsening abdominal pain being the primary concern.  She has not seen endocrinology who adjusted Synthroid, now 150 mg daily, and had not had any pain in the last few days.  Continued on Protonix and Carafate which was controlling GERD.  Intermittent right-sided abdominal pain that was somewhat alleviated with defecation.  No significant constipation or diarrhea.  Patient has had multiple ED visits over the last 3 months for various complaints.  Evaluated for diarrhea in May and again x2 in July for diarrhea and abdominal pain.  Last ED visit on 04/12/2021.  She reported several days of cramping abdominal pain with watery diarrhea s/p recent steroid injection for ocular migraines.  Reported being seen at [facility] 2 days prior for  similar symptoms.  Reported mildly elevated lipase and was prescribed Zofran and a probiotic, yet symptoms persisted.  Repeat labs with normal lipase, normal LFTs, slight leukocytosis of 11.5, UA with rare bacteria, no leukocytes.  Exam with mild diffuse tenderness.  She is prescribed dicyclomine for cramping.  Today: Not seeing [facility] any more review of the office.  She prefers to follow-up with [place].  Reports having intermittent gastritis and IBS flares.  2 weeks ago, she had a steroid injection due to ocular migraines.  After that, her gastritis and IBS began to flare.  States steroid injections always seem to do this to her as her body is very sensitive since having thyroidectomy.  She is having burning sensation in the epigastric area radiating up to her chest, nausea without vomiting, and diarrhea.  Reports epigastric burning is triggered by spaghetti sauce.  States she has been eating a lot of "crappy foods" and it triggers her symptoms every time.  She is on Protonix 40 mg twice daily and reports this works very well for her as long as she eats well and avoids her known triggers.  She also takes Carafate once daily.    Last week, diarrhea was most severe.  Lasted 1 day or so.  Stools look oily when having diarrhea.  ED provider prescribed Bentyl which she used once and has not needed it since then.  Diarrhea has resolved.  States she used to take Bentyl, but has not needed it in quite some time.  Prior to a flare of  symptoms, she was having 1-2 soft, formed BMs daily.  Denies BRBPR or melena.  Denies abdominal pain or dysphagia.  Occasionally, when she has a "bad gastritis flare", she will note a spasm in her throat, but has no trouble actually swallowing foods or liquids.   No alcohol.  No NSAIDs.   Yesterday started whole 30 diet and is already feeling much better.  Denies recent antibiotics, sick contacts, hospitalizations, travel.   Elevated Lipase: Correlates with  presentation to the ED for abdominal pain.  Lipase elevated in the 200 range.  Last CT A/P with contrast on file at time of elevated lipase on 01/08/2021 with hepatic steatosis, prior cholecystectomy, no pancreatic abnormalities.  Severe osteoarthritis. Uses a wheel chair when having to walk any distance.   Celiac screen negative August 2021.    Consider CT with pancreatic protocol vs EUS. Will need to discuss with Dr. Abbey Chatters.  Stool studies- history of Salmonella  Past Medical History:  Diagnosis Date   Anxiety    Arthritis    Depression    Diverticulosis    GERD (gastroesophageal reflux disease)    Graves disease    Internal hemorrhoids    Thyroid disease    Uterine fibroid     Past Surgical History:  Procedure Laterality Date   ABDOMINAL HYSTERECTOMY     Per patient, uterine fibroids.   ABDOMINAL HYSTERECTOMY     CHOLECYSTECTOMY N/A 11/06/2014   Procedure: LAPAROSCOPIC CHOLECYSTECTOMY;  Surgeon: Jamesetta So, MD;  Location: [facility];  Service: General;  Laterality: N/A;   COLONOSCOPY N/A 03/04/2017   Dr. Gala Romney: Hemorrhoids, diverticulosis, benign polyp without adenomatous changes.  Next colonoscopy 10 years   DILATION AND CURETTAGE OF UTERUS     ESOPHAGOGASTRODUODENOSCOPY N/A 10/02/2014   Dr. Gala Romney: fundic gland polyps, hiatal hernia   ESOPHAGOGASTRODUODENOSCOPY  07/2020   [facility] ; normal esophagus biopsied, benign-appearing gastric polyps biopsied, gastric biopsies obtained to evaluate for H. pylori, normal duodenum.  Pathology with mild chronic gastritis, negative for H. pylori, fundic gland polyps, esophageal biopsy benign.   MOUTH SURGERY     extraction of teeth   POLYPECTOMY  03/04/2017   Procedure: POLYPECTOMY;  Surgeon: Daneil Dolin, MD;  Location: [facility];  Service: Endoscopy;;  colon   THYROIDECTOMY N/A 09/30/2018   Procedure: TOTAL THYROIDECTOMY;  Surgeon: Armandina Gemma, MD;  Location: [facility];  Service: General;  Laterality: N/A;   TUBAL  LIGATION      Current Outpatient Medications  Medication Sig Dispense Refill   acetaminophen (TYLENOL) 500 MG tablet Take 1,000 mg by mouth daily as needed for moderate pain.     albuterol (VENTOLIN HFA) 108 (90 Base) MCG/ACT inhaler Inhale 2 puffs into the lungs every 6 (six) hours as needed. 18 g 11   cetirizine (ZYRTEC) 10 MG tablet Take 10 mg by mouth daily.     Cholecalciferol (VITAMIN D3) 125 MCG (5000 UT) CAPS Take 1 capsule (5,000 Units total) by mouth daily. (Patient taking differently: Take 10,000 Units by mouth daily.) 30 capsule 11   EPINEPHrine 0.3 mg/0.3 mL IJ SOAJ injection Inject 0.3 mg into the muscle as needed.     escitalopram (LEXAPRO) 20 MG tablet Take 1 tablet (20 mg total) by mouth at bedtime. 30 tablet 5   fluticasone (FLONASE) 50 MCG/ACT nasal spray Place 2 sprays into both nostrils daily.     ipratropium (ATROVENT) 0.02 % nebulizer solution Take 0.5 mg by nebulization every 6 (six) hours as needed for wheezing  or shortness of breath.     LIDOCAINE-MENTHOL ROLL-ON EX Apply 1 application topically daily as needed (pain).     Magnesium 250 MG TABS Take 250 mg by mouth at bedtime.      NON FORMULARY at bedtime. CPAP at night     nystatin cream (MYCOSTATIN) Apply topically 3 (three) times daily.     ondansetron (ZOFRAN-ODT) 4 MG disintegrating tablet Take 4 mg by mouth every 8 (eight) hours as needed.     RESTASIS 0.05 % ophthalmic emulsion Place 1 drop into both eyes 2 (two) times daily.     simethicone (MYLICON) 706 MG chewable tablet Chew 125-250 mg by mouth every 6 (six) hours as needed for flatulence.      sucralfate (CARAFATE) 1 g tablet Take 1 g by mouth daily.     SYNTHROID 150 MCG tablet Take 150 mcg by mouth daily.     terbinafine (LAMISIL) 250 MG tablet Take 250 mg by mouth daily.     Continuous Blood Gluc Sensor (FREESTYLE LIBRE 2 SENSOR) MISC 1 Device by Does not apply route every 14 (fourteen) days. 6 each 3   dicyclomine (BENTYL) 10 MG capsule Take 1  capsule (10 mg total) by mouth up to 3 times daily before meals and at bedtime for abdominal cramping or diarrhea.  Hold in the setting of constipation. 90 capsule 0   losartan (COZAAR) 25 MG tablet Take 1 tablet (25 mg total) by mouth daily. (Patient not taking: No sig reported) 90 tablet 3   pantoprazole (PROTONIX) 40 MG tablet Take 1 tablet (40 mg total) by mouth 2 (two) times daily. 180 tablet 3   No current facility-administered medications for this visit.    Allergies as of 04/17/2021 - Review Complete 04/17/2021  Allergen Reaction Noted   Levofloxacin Other (See Comments) 03/03/2017   Toradol [ketorolac tromethamine] Anaphylaxis and Hives 06/08/2019   Tramadol Hives 01/11/2021   Other Other (See Comments) 12/17/2015   Chamomile  03/06/2020   Hctz [hydrochlorothiazide] Other (See Comments) 06/22/2020   Lisinopril Nausea Only 01/18/2018   Losartan potassium  06/22/2020   Penicillins Other (See Comments) 04/06/2012    Family History  Problem Relation Age of Onset   Uterine cancer Mother        ?cervical cancer   Hypertension Father    Diabetes Father    Uterine cancer Sister        suicide   Colon cancer Paternal Aunt 67   Depression Other    Thyroid disease Neg Hx    Pancreatic cancer Neg Hx     Social History   Socioeconomic History   Marital status: Married    Spouse name: Not on file   Number of children: Not on file   Years of education: Not on file   Highest education level: Not on file  Occupational History   Not on file  Tobacco Use   Smoking status: Former    Packs/day: 1.00    Years: 5.00    Pack years: 5.00    Types: Cigarettes    Quit date: 11/03/1997    Years since quitting: 23.4   Smokeless tobacco: Never   Tobacco comments:    1 pack 1 week  Vaping Use   Vaping Use: Never used  Substance and Sexual Activity   Alcohol use: No    Alcohol/week: 0.0 standard drinks   Drug use: No   Sexual activity: Yes    Birth control/protection: Surgical   Other Topics Concern  Not on file  Social History Narrative   Not on file   Social Determinants of Health   Financial Resource Strain: Not on file  Food Insecurity: Not on file  Transportation Needs: Not on file  Physical Activity: Not on file  Stress: Not on file  Social Connections: Not on file    Review of Systems: Gen: Denies fever, chills, cold or flu like symptoms, pre-syncope, or syncope.  CV: Denies chest pain, palpitations Resp: Denies dyspnea, cough GI: See HPI Heme: See HPI  Physical Exam: BP (!) 160/88   Pulse 98   Temp 97.8 F (36.6 C)   Ht 5\' 3"  (1.6 m)   Wt 266 lb (120.7 kg)   LMP 10/09/2014   BMI 47.12 kg/m  General:   Alert and oriented. No distress noted. Pleasant and cooperative.  Head:  Normocephalic and atraumatic. Eyes:  Conjuctiva clear without scleral icterus. Mouth:  Oral mucosa pink and moist. Good dentition. No lesions. Heart:  S1, S2 present without murmurs appreciated. Lungs:  Clear to auscultation bilaterally. No wheezes, rales, or rhonchi. No distress.  Abdomen:  +BS, soft, non-tender and non-distended. No rebound or guarding. No HSM or masses noted. Msk:  Symmetrical without gross deformities. Normal posture. Extremities:  Without edema. Neurologic:  Alert and  oriented x4 Psych:  Alert and cooperative. Normal mood and affect.    Assessment:  45 y.o. female with history of Graves' disease (s/p thyroidectomy 2020), OSA (on CPAP), HLD, GERD, IBS, abdominal pain presenting today for further evaluation of abdominal pain and diarrhea.   She reports intermittent flares of gastritis and IBS characterized by burning sensation in the epigastric area radiating up to her chest, nausea without vomiting, and diarrhea.  Denies brbpr, melena, or unintentional weight loss. Denies NSAIDs or alcohol. Most recent episode 2 weeks ago following a steroid injection which she reports is also a common trigger of her GI symptoms. Currently, she is doing very  well without any symptoms. Continues with Protonix 40 mg twice daily which has always worked well for her if she ate well in conjunction with this, but reports she has also had a very poor diet lately. Diarrhea has resolved. Used Bentyl x1 after recent ED evaluation. She has been evaluated in the ED several times. After OV, I was able to review these prior evaluations further. Labs unrevealing aside from intermittently elevated lipase up to 200 range. Mild lipase elevation dates back as far as 2019. CT in April 2022 did not reveal any pancreatic abnormalities. LFTs have been normal aside from slight elevation in AST at 41 in June 2022. History of cholecystectomy.  Colonoscopy up-to-date in 2018, due for repeat in 2028.  EGD on file from November 2021 at [facility].  Per review of their records, this revealed gastritis, biopsies negative for H. pylori, fundic gland polyps.  Overall, suspect symptoms are related to intermittent GERD/gastritis and IBS flares likely primarily triggered by poor dietary habits. Patient reports she just started the Whole 30 diet and is already feeling much better. Significance of elevated Lipase is unknown. It seems that this always correlates with a flare of her symptoms. Query whether this could be secondary to gastritis or underlying PUD. Can't rule out occult pancreatic lesion.   Plan: Recommend CT with pancreatic protocol to follow-up on elevated lipase. We will reach out to patient to get this arranged as this was decided after further review of her chart after OV.  Continue Protonix 40 mg twice daily 30  minutes before breakfast and dinner. Reinforced GERD diet/lifestyle.  Written instructions provided. Encouraged to continue Whole 30 diet.  May continue to use dicyclomine as needed for intermittent abdominal cramping or loose stools. Follow-up in 4-6 months or sooner if needed.    Aliene Altes, PA-C [facility] 04/17/2021

## 2021-04-17 ENCOUNTER — Telehealth: Payer: Self-pay | Admitting: Internal Medicine

## 2021-04-17 ENCOUNTER — Encounter: Payer: Self-pay | Admitting: Gastroenterology

## 2021-04-17 ENCOUNTER — Encounter: Payer: Self-pay | Admitting: Internal Medicine

## 2021-04-17 ENCOUNTER — Other Ambulatory Visit: Payer: Self-pay

## 2021-04-17 ENCOUNTER — Ambulatory Visit: Payer: PRIVATE HEALTH INSURANCE | Admitting: Gastroenterology

## 2021-04-17 ENCOUNTER — Other Ambulatory Visit: Payer: Self-pay | Admitting: Gastroenterology

## 2021-04-17 VITALS — BP 160/88 | HR 98 | Temp 97.8°F | Ht 63.0 in | Wt 266.0 lb

## 2021-04-17 DIAGNOSIS — K219 Gastro-esophageal reflux disease without esophagitis: Secondary | ICD-10-CM

## 2021-04-17 DIAGNOSIS — R748 Abnormal levels of other serum enzymes: Secondary | ICD-10-CM | POA: Diagnosis not present

## 2021-04-17 DIAGNOSIS — R1010 Upper abdominal pain, unspecified: Secondary | ICD-10-CM

## 2021-04-17 DIAGNOSIS — R197 Diarrhea, unspecified: Secondary | ICD-10-CM

## 2021-04-17 DIAGNOSIS — K589 Irritable bowel syndrome without diarrhea: Secondary | ICD-10-CM

## 2021-04-17 MED ORDER — DICYCLOMINE HCL 10 MG PO CAPS: ORAL_CAPSULE | ORAL | 0 refills | Status: DC

## 2021-04-17 MED ORDER — PANTOPRAZOLE SODIUM 40 MG PO TBEC: 40.0000 mg | DELAYED_RELEASE_TABLET | Freq: Two times a day (BID) | ORAL | 3 refills | Status: DC

## 2021-04-17 NOTE — Telephone Encounter (Signed)
Yes, I will send this in.

## 2021-04-17 NOTE — Telephone Encounter (Signed)
Patient called and said that Heather Fry needs to call in  Bentyl to [facility] in [place].

## 2021-04-17 NOTE — Patient Instructions (Signed)
Continue taking Pantoprazole 40 mg twice daily 30 minutes before breakfast and dinner.   Follow a GERD diet:  Avoid fried, fatty, greasy, spicy, citrus foods. Avoid caffeine and carbonated beverages. Avoid chocolate. Try eating 4-6 small meals a day rather than 3 large meals. Do not eat within 3 hours of laying down. Prop head of bed up on wood or bricks to create a 6 inch incline.  You may continue to use dicyclomine as needed for intermittent abdominal cramping or loose stools.  Continue with whole 30 diet.  I think this will be great for you!  I am going to look into your elevated lipase further.  We will call you with additional recommendations.  Plan to follow-up in 4-6 months.  Do not hesitate to call if you have questions or concerns prior to next visit.   It was great meeting you today!   Aliene Altes, PA-C [facility]

## 2021-04-17 NOTE — Telephone Encounter (Signed)
Heather Altes, PA-C:  Can we send in RX for Bentyl for pt?

## 2021-04-18 ENCOUNTER — Telehealth: Payer: Self-pay | Admitting: Gastroenterology

## 2021-04-18 ENCOUNTER — Encounter: Payer: Self-pay | Admitting: Gastroenterology

## 2021-04-18 DIAGNOSIS — R748 Abnormal levels of other serum enzymes: Secondary | ICD-10-CM

## 2021-04-18 DIAGNOSIS — R1010 Upper abdominal pain, unspecified: Secondary | ICD-10-CM

## 2021-04-18 NOTE — Telephone Encounter (Signed)
Please let patient know after further review, I recommend CT with pancreatic protocol to further evaluate intermittently elevated lipase. (We discussed this possibility at her office visit.  I had advised we would reach back out to her after further review.)  [facility] Clinical Pool: Please arrange CT with pancreatic protocol. Dx: Intermittently elevated lipase, upper abdominal pain.

## 2021-04-18 NOTE — Telephone Encounter (Signed)
Spoke to pt and made her aware that RX had been sent.  She informed me that she picked it up yesterday.

## 2021-04-19 DIAGNOSIS — G5601 Carpal tunnel syndrome, right upper limb: Secondary | ICD-10-CM | POA: Insufficient documentation

## 2021-04-19 DIAGNOSIS — M5030 Other cervical disc degeneration, unspecified cervical region: Secondary | ICD-10-CM | POA: Insufficient documentation

## 2021-04-19 NOTE — Telephone Encounter (Signed)
Tried to call pt, LMOAM for return call.

## 2021-04-22 ENCOUNTER — Other Ambulatory Visit: Payer: Self-pay | Admitting: Neurosurgery

## 2021-04-22 DIAGNOSIS — M5030 Other cervical disc degeneration, unspecified cervical region: Secondary | ICD-10-CM

## 2021-04-22 NOTE — Telephone Encounter (Signed)
Tried to call pt, LMOAM for return call.

## 2021-04-22 NOTE — Telephone Encounter (Signed)
Letter mailed

## 2021-04-22 NOTE — Telephone Encounter (Signed)
Pt called office, informed her of Kristen's recommendation. She is agreeable to CT with pancreatic protocol.

## 2021-04-22 NOTE — Addendum Note (Signed)
Addended by: Hassan Rowan on: 04/22/2021 04:43 PM   Modules accepted: Orders

## 2021-04-23 ENCOUNTER — Ambulatory Visit (INDEPENDENT_AMBULATORY_CARE_PROVIDER_SITE_OTHER): Payer: Self-pay | Admitting: Bariatrics

## 2021-04-23 ENCOUNTER — Ambulatory Visit (INDEPENDENT_AMBULATORY_CARE_PROVIDER_SITE_OTHER): Payer: 59 | Admitting: Bariatrics

## 2021-04-23 NOTE — Telephone Encounter (Signed)
CT scheduled for 05/14/21 at 3:00pm, arrive at 2:45pm. NPO 4 hours prior to test.   Tried to call pt, LMOAM to inform her CT has been scheduled and appt letter mailed.

## 2021-04-23 NOTE — Telephone Encounter (Signed)
PA for CT abd w/wo contrast submitted via [facility] website. PA# [id number], valid 04/23/21-06/07/21.

## 2021-04-29 ENCOUNTER — Other Ambulatory Visit: Payer: Self-pay | Admitting: Neurosurgery

## 2021-04-29 ENCOUNTER — Encounter (HOSPITAL_COMMUNITY): Payer: Self-pay | Admitting: *Deleted

## 2021-04-29 ENCOUNTER — Other Ambulatory Visit: Payer: Self-pay

## 2021-04-29 ENCOUNTER — Emergency Department (HOSPITAL_COMMUNITY)
Admission: EM | Admit: 2021-04-29 | Discharge: 2021-04-29 | Disposition: A | Discharge status: Home / Self Care | Payer: Medicaid Other | Attending: Emergency Medicine | Admitting: Emergency Medicine

## 2021-04-29 DIAGNOSIS — E039 Hypothyroidism, unspecified: Secondary | ICD-10-CM | POA: Diagnosis not present

## 2021-04-29 DIAGNOSIS — E876 Hypokalemia: Secondary | ICD-10-CM | POA: Insufficient documentation

## 2021-04-29 DIAGNOSIS — Z79899 Other long term (current) drug therapy: Secondary | ICD-10-CM | POA: Insufficient documentation

## 2021-04-29 DIAGNOSIS — Z87891 Personal history of nicotine dependence: Secondary | ICD-10-CM | POA: Insufficient documentation

## 2021-04-29 LAB — CBC WITH DIFFERENTIAL/PLATELET
Abs Immature Granulocytes: 0.03 10*3/uL (ref 0.00–0.07)
Basophils Absolute: 0 10*3/uL (ref 0.0–0.1)
Basophils Relative: 0 %
Eosinophils Absolute: 0.1 10*3/uL (ref 0.0–0.5)
Eosinophils Relative: 1 %
HCT: 39.1 % (ref 36.0–46.0)
Hemoglobin: 12.6 g/dL (ref 12.0–15.0)
Immature Granulocytes: 0 %
Lymphocytes Relative: 22 %
Lymphs Abs: 2 10*3/uL (ref 0.7–4.0)
MCH: 28.8 pg (ref 26.0–34.0)
MCHC: 32.2 g/dL (ref 30.0–36.0)
MCV: 89.5 fL (ref 80.0–100.0)
Monocytes Absolute: 0.4 10*3/uL (ref 0.1–1.0)
Monocytes Relative: 4 %
Neutro Abs: 6.8 10*3/uL (ref 1.7–7.7)
Neutrophils Relative %: 73 %
Platelets: 235 10*3/uL (ref 150–400)
RBC: 4.37 MIL/uL (ref 3.87–5.11)
RDW: 14.6 % (ref 11.5–15.5)
WBC: 9.4 10*3/uL (ref 4.0–10.5)
nRBC: 0 % (ref 0.0–0.2)

## 2021-04-29 LAB — URINALYSIS, ROUTINE W REFLEX MICROSCOPIC
Bilirubin Urine: NEGATIVE
Glucose, UA: NEGATIVE mg/dL
Ketones, ur: NEGATIVE mg/dL
Leukocytes,Ua: NEGATIVE
Nitrite: NEGATIVE
Protein, ur: NEGATIVE mg/dL
Specific Gravity, Urine: 1.013 (ref 1.005–1.030)
pH: 7 (ref 5.0–8.0)

## 2021-04-29 LAB — COMPREHENSIVE METABOLIC PANEL
ALT: 40 U/L (ref 0–44)
AST: 49 U/L — ABNORMAL HIGH (ref 15–41)
Albumin: 4 g/dL (ref 3.5–5.0)
Alkaline Phosphatase: 51 U/L (ref 38–126)
Anion gap: 10 (ref 5–15)
BUN: 7 mg/dL (ref 6–20)
CO2: 22 mmol/L (ref 22–32)
Calcium: 8.8 mg/dL — ABNORMAL LOW (ref 8.9–10.3)
Chloride: 103 mmol/L (ref 98–111)
Creatinine, Ser: 0.6 mg/dL (ref 0.44–1.00)
GFR, Estimated: >60 mL/min (ref >60)
Glucose, Bld: 184 mg/dL — ABNORMAL HIGH (ref 70–99)
Potassium: 3.4 mmol/L — ABNORMAL LOW (ref 3.5–5.1)
Sodium: 135 mmol/L (ref 135–145)
Total Bilirubin: 0.4 mg/dL (ref 0.3–1.2)
Total Protein: 7.6 g/dL (ref 6.5–8.1)

## 2021-04-29 LAB — MAGNESIUM: Magnesium: 1.9 mg/dL (ref 1.7–2.4)

## 2021-04-29 MED ORDER — SODIUM CHLORIDE 0.9 % IV BOLUS
1000.0000 mL | Freq: Once | INTRAVENOUS | Status: AC
  Administered 2021-04-29: 1000 mL via INTRAVENOUS

## 2021-04-29 NOTE — ED Triage Notes (Signed)
States she worked outside yesterday and may have become dehydrated

## 2021-04-29 NOTE — ED Provider Notes (Signed)
Emergency Medicine Provider Triage Evaluation Note  Heather Fry , a 45 y.o. female  was evaluated in triage.  Pt complains of dehydration.  History of thyroid disease, takes Synthroid every day.  Was working out on her deck yesterday and felt significantly dehydrated and off.  She also had some shortness of breath, took a breathing treatment and that improved.  (History of asthma, allergies).   Review of Systems  Positive: Dehydration Negative: Chest pain  Physical Exam  BP (!) 164/96   Pulse 90   Temp 98.2 F (36.8 C) (Oral)   Resp 18   LMP 10/09/2014   SpO2 98%  Gen:   Awake, no distress   Resp:  Normal effort  MSK:   Moves extremities without difficulty  Other:    Medical Decision Making  Medically screening exam initiated at 6:52 PM.  Appropriate orders placed.  Heather Fry was informed that the remainder of the evaluation will be completed by another provider, this initial triage assessment does not replace that evaluation, and the importance of remaining in the ED until their evaluation is complete.     Sherrill Raring, PA-C 04/29/21 1853    Margette Fast, MD 04/30/21 [phone]

## 2021-04-29 NOTE — Discharge Instructions (Addendum)
Your potassium level this evening was slightly low.  Recommend that you supplement your potassium intake for the next 3 to 4 days.  You will need to have your potassium level rechecked later this week or first of next week.  Follow-up with your primary care provider for recheck.  Return emergency department for any worsening symptoms.

## 2021-05-10 ENCOUNTER — Emergency Department (HOSPITAL_COMMUNITY)
Admission: EM | Admit: 2021-05-10 | Discharge: 2021-05-10 | Disposition: A | Discharge status: Home / Self Care | Payer: Medicaid Other | Attending: Emergency Medicine | Admitting: Emergency Medicine

## 2021-05-10 ENCOUNTER — Emergency Department (HOSPITAL_COMMUNITY): Payer: Medicaid Other

## 2021-05-10 ENCOUNTER — Other Ambulatory Visit: Payer: Self-pay

## 2021-05-10 ENCOUNTER — Encounter (HOSPITAL_COMMUNITY): Payer: Self-pay | Admitting: *Deleted

## 2021-05-10 DIAGNOSIS — K219 Gastro-esophageal reflux disease without esophagitis: Secondary | ICD-10-CM | POA: Diagnosis not present

## 2021-05-10 DIAGNOSIS — Z87891 Personal history of nicotine dependence: Secondary | ICD-10-CM | POA: Diagnosis not present

## 2021-05-10 DIAGNOSIS — R0789 Other chest pain: Secondary | ICD-10-CM | POA: Diagnosis present

## 2021-05-10 LAB — CBC
HCT: 39.1 % (ref 36.0–46.0)
Hemoglobin: 12.7 g/dL (ref 12.0–15.0)
MCH: 28.5 pg (ref 26.0–34.0)
MCHC: 32.5 g/dL (ref 30.0–36.0)
MCV: 87.9 fL (ref 80.0–100.0)
Platelets: 280 10*3/uL (ref 150–400)
RBC: 4.45 MIL/uL (ref 3.87–5.11)
RDW: 14.1 % (ref 11.5–15.5)
WBC: 9.7 10*3/uL (ref 4.0–10.5)
nRBC: 0 % (ref 0.0–0.2)

## 2021-05-10 LAB — BASIC METABOLIC PANEL
Anion gap: 9 (ref 5–15)
BUN: 11 mg/dL (ref 6–20)
CO2: 25 mmol/L (ref 22–32)
Calcium: 9.5 mg/dL (ref 8.9–10.3)
Chloride: 102 mmol/L (ref 98–111)
Creatinine, Ser: 0.63 mg/dL (ref 0.44–1.00)
GFR, Estimated: >60 mL/min (ref >60)
Glucose, Bld: 183 mg/dL — ABNORMAL HIGH (ref 70–99)
Potassium: 3.6 mmol/L (ref 3.5–5.1)
Sodium: 136 mmol/L (ref 135–145)

## 2021-05-10 LAB — TROPONIN I (HIGH SENSITIVITY): Troponin I (High Sensitivity): 4 ng/L (ref <18)

## 2021-05-10 IMAGING — DX DG CHEST 2V
2 series · 2 of 2 positions shown · non-contrast
Comparison: [DATE]

CLINICAL DATA: Chest pain

EXAM:
CHEST - 2 VIEW

[chest pa]
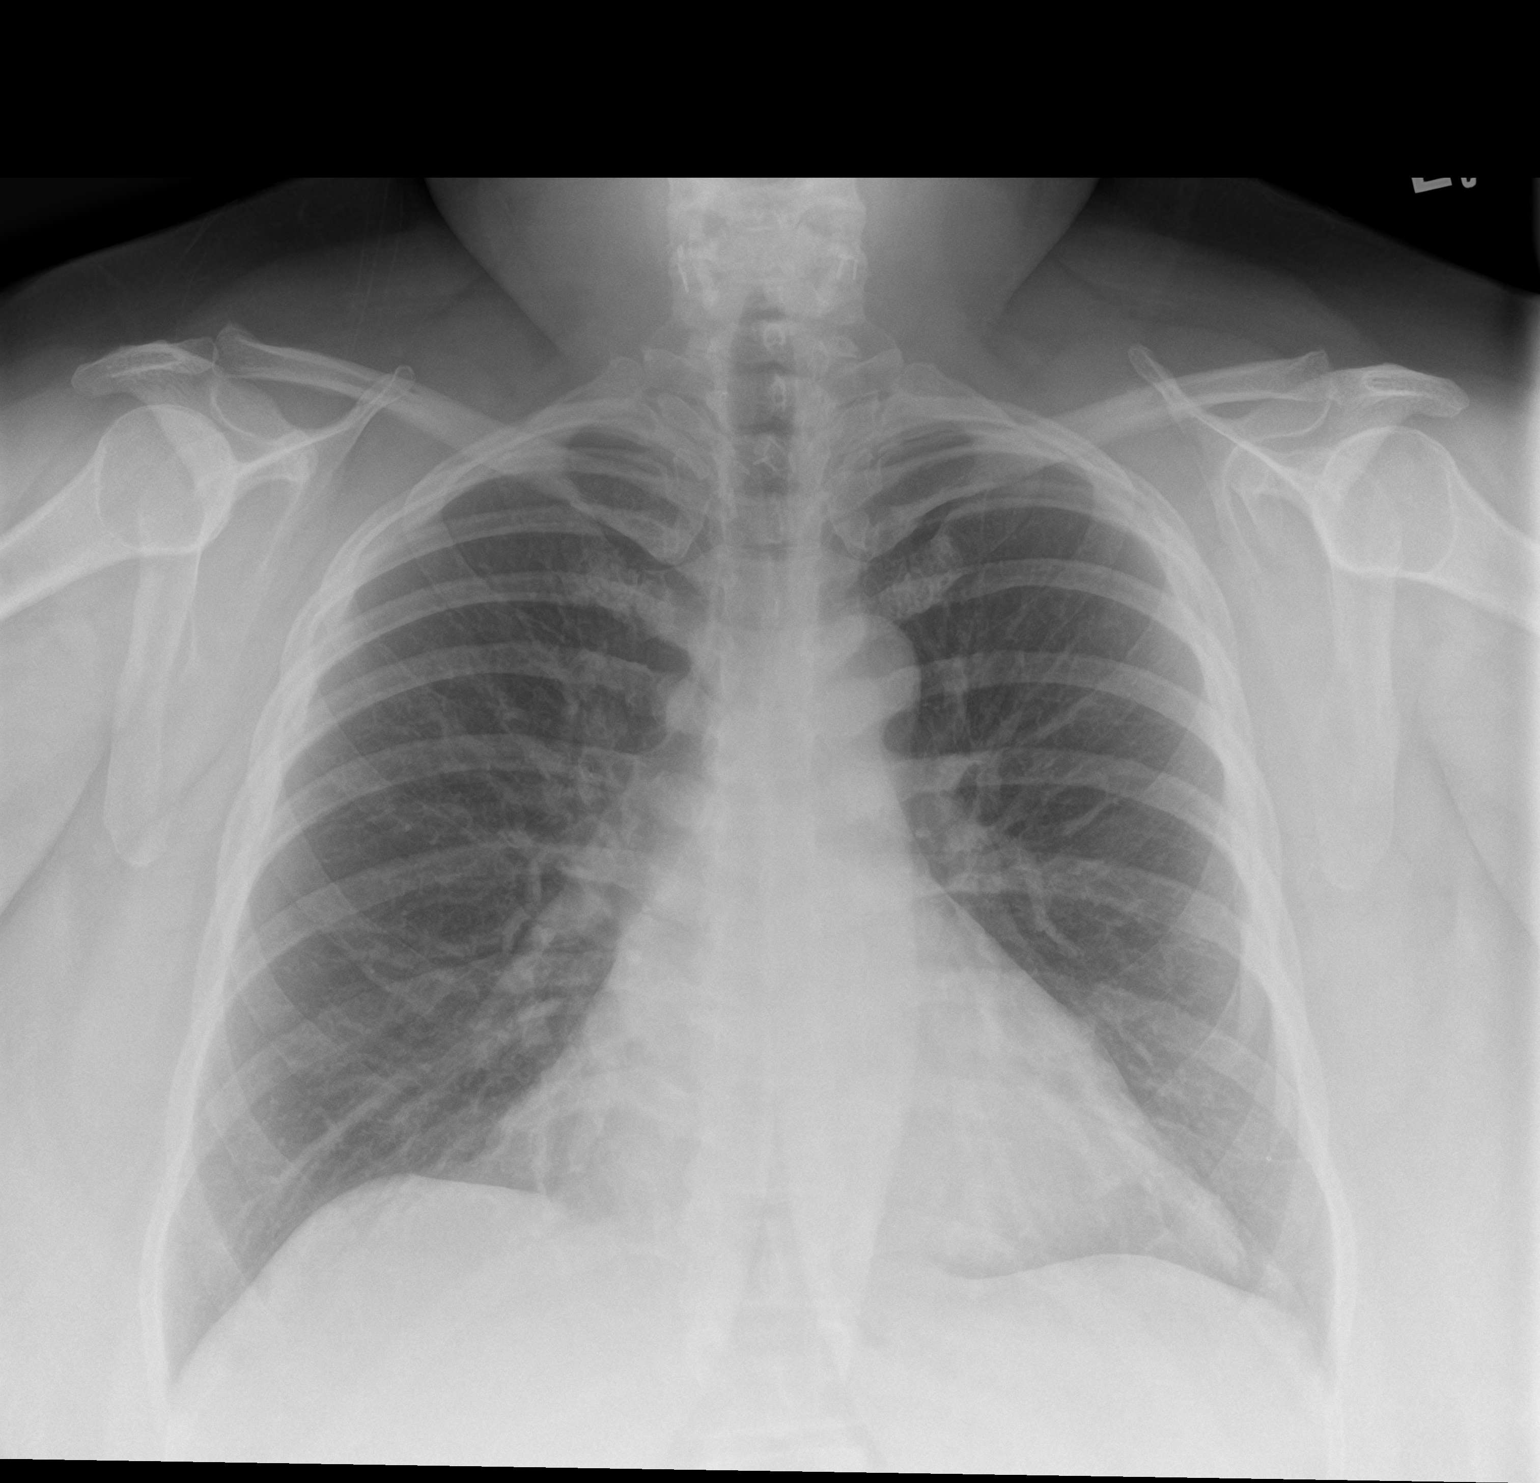

[chest lat]
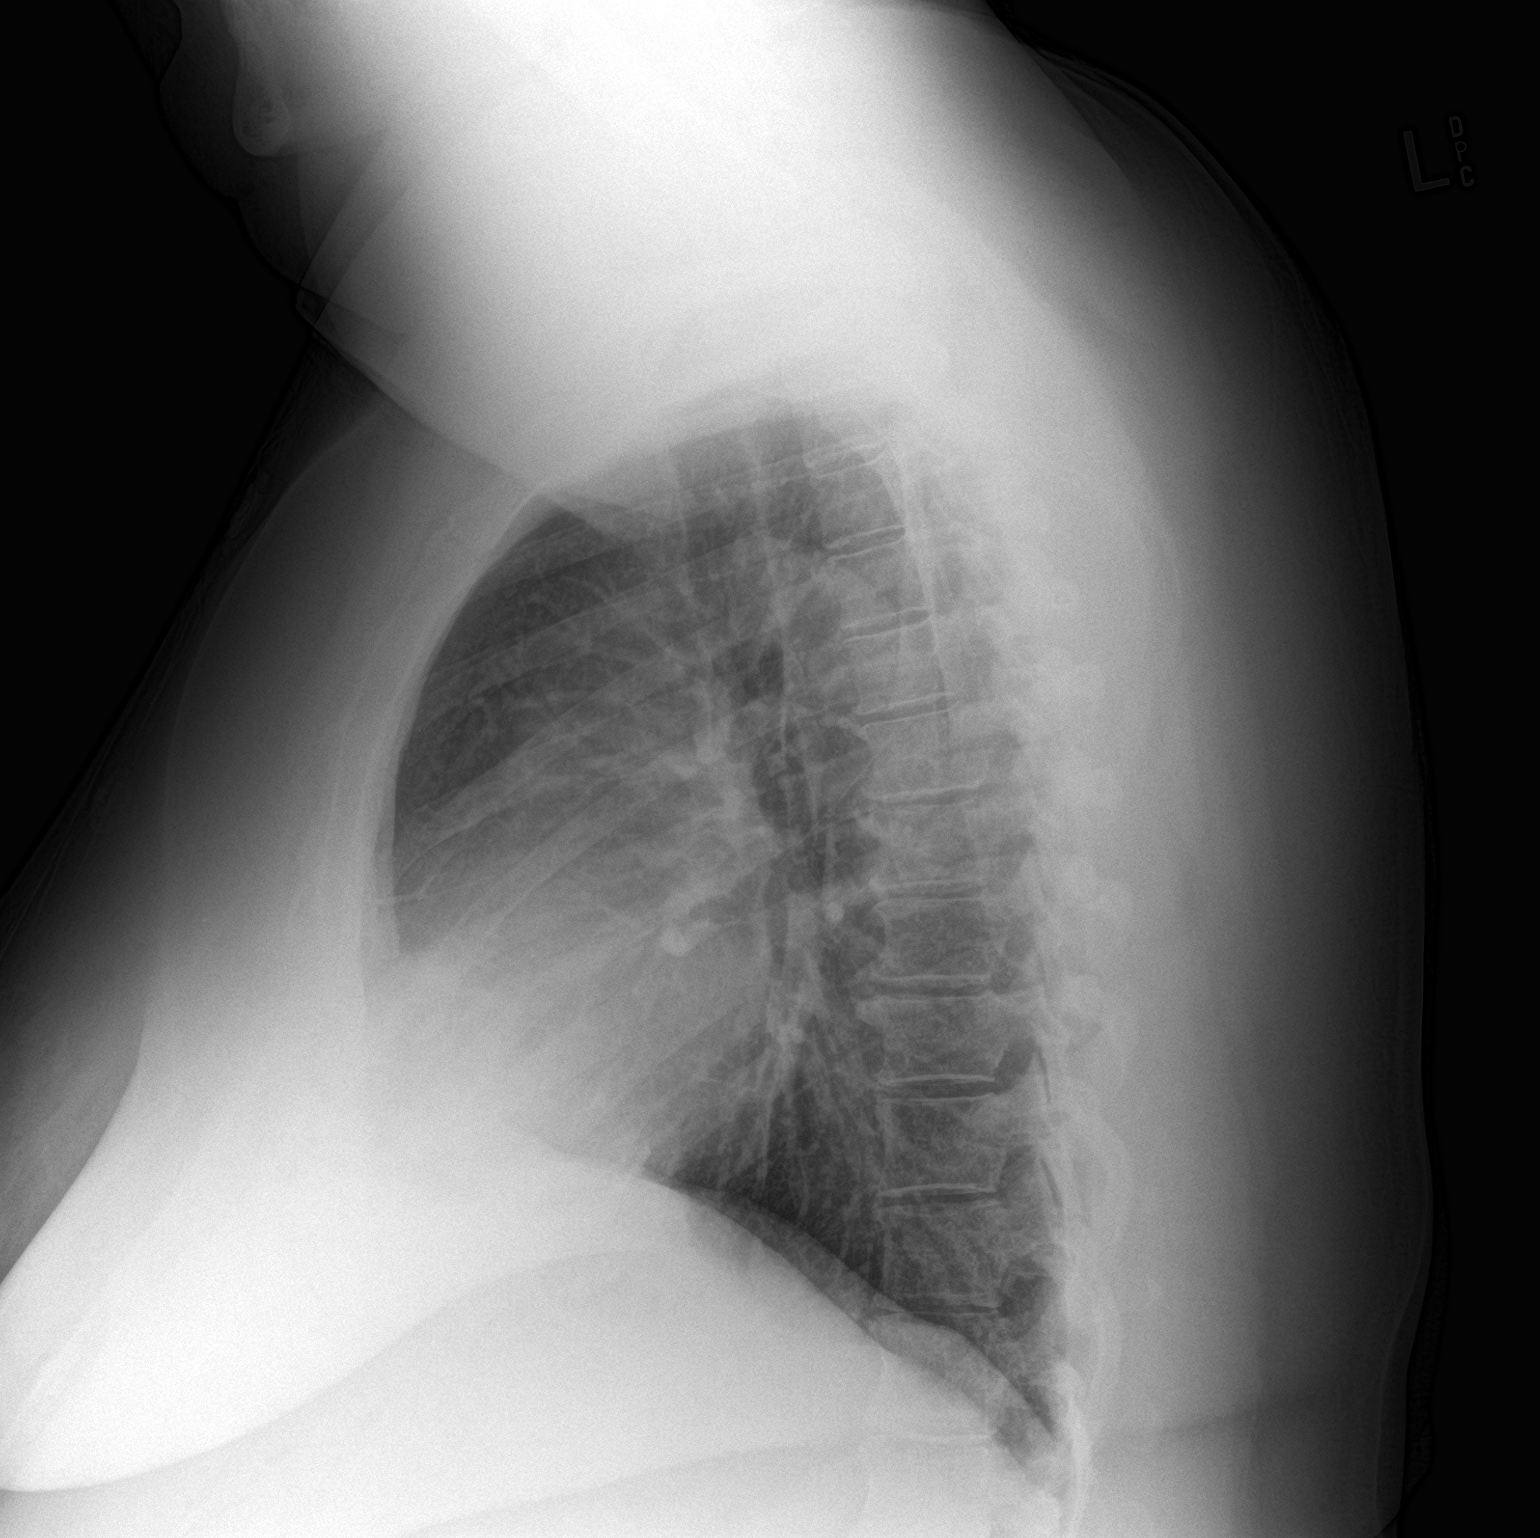

[2 of 2 positions shown; findings below may reference images not displayed]

FINDINGS: Stable cardiomediastinal contours. No focal airspace consolidation,
pleural effusion, or pneumothorax. No acute osseous findings.
IMPRESSION: No active cardiopulmonary disease.

## 2021-05-10 NOTE — ED Provider Notes (Signed)
[facility] EMERGENCY DEPARTMENT Provider Note   CSN: [id number] Arrival date & time: 05/10/21  1930     History Chief Complaint  Patient presents with   Chest Pain    Heather Fry is a 45 y.o. female.   Chest Pain  This patient is a 45 year old female, she presents to the hospital today with a complaint of chest discomfort which started yesterday, went away and then came back about 9 hours ago, she still feels a subtle discomfort in her chest.  She has also been having some epigastric discomfort and has known gastritis for which she takes Protonix and Carafate.  She has never had any heart disease, she was recently diagnosed with some difficulty with possible diabetes, she has had some fairly wide swings in her electrolytes secondary to having her thyroid out and has recently been on multiple doses of steroids.  These were for mostly orthopedic injuries.  She reports the chest discomfort does not radiate it is not associated with shortness of breath, nausea or diaphoresis, no swelling of the legs.  She has never had any heart disease and in fact within the last couple of years she had a negative stress test and echocardiogram.  Past Medical History:  Diagnosis Date   Anxiety    Arthritis    Depression    Diverticulosis    GERD (gastroesophageal reflux disease)    Graves disease    Internal hemorrhoids    Thyroid disease    Uterine fibroid     Patient Active Problem List   Diagnosis Date Noted   Irritable bowel syndrome 04/17/2021   Weight gain 12/20/2020   Diarrhea 05/08/2020   Idiopathic peripheral neuropathy 04/18/2020   Myelopathy ([place]) 04/18/2020   OSA on CPAP 04/18/2020   RLS (restless legs syndrome) 04/18/2020   Sensory disturbance 01/20/2020   Atypical chest pain 01/20/2020   Obesity, Class III, BMI 40-49.9 (morbid obesity) ([place]) 01/20/2020   Weakness 01/19/2020   Hyperglycemia 10/04/2019   Myalgia 09/05/2019   Vitamin B 12 deficiency 07/28/2019   Right  sided numbness 06/29/2019   Constipation 06/15/2019   Elevated lipase 06/15/2019   Hypomagnesemia 03/29/2019   Leukocytosis 03/29/2019   Pain of upper abdomen 02/10/2019   Nausea without vomiting 02/10/2019   Tingling of face 11/30/2018   Hot flashes 11/30/2018   Vitamin D deficiency 10/06/2018   GERD (gastroesophageal reflux disease) 01/28/2017   Rectal bleeding 01/28/2017   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   ABDOMINAL HYSTERECTOMY     CHOLECYSTECTOMY N/A 11/06/2014   Procedure: LAPAROSCOPIC CHOLECYSTECTOMY;  Surgeon: Jamesetta So, MD;  Location: [facility] ORS;  Service: General;  Laterality: N/A;   COLONOSCOPY N/A 03/04/2017   Dr. Gala Romney: Hemorrhoids, diverticulosis, benign polyp without adenomatous changes.  Next colonoscopy 10 years   DILATION AND CURETTAGE OF UTERUS     ESOPHAGOGASTRODUODENOSCOPY N/A 10/02/2014   Dr. Gala Romney: fundic gland polyps, hiatal hernia   ESOPHAGOGASTRODUODENOSCOPY  07/2020   [facility] ; normal esophagus biopsied, benign-appearing gastric polyps biopsied, gastric biopsies obtained to evaluate for H. pylori, normal duodenum.  Pathology with mild chronic gastritis, negative for H. pylori, fundic gland polyps, esophageal biopsy benign.   MOUTH SURGERY     extraction of teeth  POLYPECTOMY  03/04/2017   Procedure: POLYPECTOMY;  Surgeon: Daneil Dolin, MD;  Location: [facility] ENDO SUITE;  Service: Endoscopy;;  colon   THYROIDECTOMY N/A 09/30/2018   Procedure: TOTAL THYROIDECTOMY;  Surgeon: Armandina Gemma, MD;  Location: [facility] ORS;  Service: General;  Laterality: N/A;   TUBAL LIGATION       OB History     Gravida  2   Para  2   Term  2   Preterm      AB      Living  2      SAB      IAB       Ectopic      Multiple      Live Births              Family History  Problem Relation Age of Onset   Uterine cancer Mother        ?cervical cancer   Hypertension Father    Diabetes Father    Uterine cancer Sister        suicide   Colon cancer Paternal Aunt 70   Depression Other    Thyroid disease Neg Hx    Pancreatic cancer Neg Hx     Social History   Tobacco Use   Smoking status: Former    Packs/day: 1.00    Years: 5.00    Pack years: 5.00    Types: Cigarettes    Quit date: 11/03/1997    Years since quitting: 23.5   Smokeless tobacco: Never   Tobacco comments:    1 pack 1 week  Vaping Use   Vaping Use: Never used  Substance Use Topics   Alcohol use: No    Alcohol/week: 0.0 standard drinks   Drug use: No    Home Medications Prior to Admission medications   Medication Sig Start Date End Date Taking? Authorizing Provider  acarbose (PRECOSE) 25 MG tablet Take 25 mg by mouth 3 (three) times daily. 04/22/21   [provider]  acetaminophen (TYLENOL) 500 MG tablet Take 1,000 mg by mouth daily as needed for moderate pain.    [provider]  albuterol (VENTOLIN HFA) 108 (90 Base) MCG/ACT inhaler Inhale 2 puffs into the lungs every 6 (six) hours as needed. Patient taking differently: Inhale 2 puffs into the lungs every 6 (six) hours as needed for wheezing or shortness of breath. 12/13/19   Julian Hy, DO  cetirizine (ZYRTEC) 10 MG tablet Take 10 mg by mouth daily.    [provider]  Cholecalciferol (VITAMIN D3) 125 MCG (5000 UT) CAPS Take 1 capsule (5,000 Units total) by mouth daily. Patient taking differently: Take 10,000 Units by mouth daily. 10/13/19   Renato Shin, MD  Continuous Blood Gluc Sensor (FREESTYLE LIBRE 2 SENSOR) MISC 1 Device by Does not apply route every 14 (fourteen) days. 12/27/20   Renato Shin, MD  dicyclomine (BENTYL) 10 MG capsule Take 1 capsule (10 mg total) by mouth up to 3 times daily before meals and at bedtime for  abdominal cramping or diarrhea.  Hold in the setting of constipation. 04/17/21   Erenest Rasher, PA-C  EPINEPHrine 0.3 mg/0.3 mL IJ SOAJ injection Inject 0.3 mg into the muscle as needed for anaphylaxis. 08/10/20   [provider]  escitalopram (LEXAPRO) 20 MG tablet Take 1 tablet (20 mg total) by mouth at bedtime. 01/26/19   Renato Shin, MD  fluticasone (FLONASE) 50 MCG/ACT nasal spray Place 2 sprays into both nostrils at bedtime.  [provider]  ipratropium (ATROVENT) 0.02 % nebulizer solution Take 0.5 mg by nebulization daily.    [provider]  LIDOCAINE-MENTHOL ROLL-ON EX Apply 1 application topically daily as needed (pain).    [provider]  losartan (COZAAR) 25 MG tablet Take 1 tablet (25 mg total) by mouth daily. Patient not taking: No sig reported 05/31/20 08/29/20  Donato Heinz, MD  Magnesium 250 MG TABS Take 250 mg by mouth at bedtime.     [provider]  NON FORMULARY at bedtime. CPAP at night    [provider]  nystatin cream (MYCOSTATIN) Apply 1 application topically daily as needed (toe fungus). 03/15/21   [provider]  ondansetron (ZOFRAN-ODT) 4 MG disintegrating tablet Take 4 mg by mouth every 8 (eight) hours as needed. 04/11/21   [provider]  pantoprazole (PROTONIX) 40 MG tablet Take 1 tablet (40 mg total) by mouth 2 (two) times daily. 04/17/21   Erenest Rasher, PA-C  RESTASIS 0.05 % ophthalmic emulsion Place 1 drop into both eyes 2 (two) times daily. 12/26/19   [provider]  Simethicone 250 MG CAPS Take 500 mg by mouth at bedtime.    [provider]  sucralfate (CARAFATE) 1 g tablet Take 1 g by mouth daily. 08/20/20   [provider]  SYNTHROID 150 MCG tablet Take 150 mcg by mouth daily. 04/06/21   [provider]  terbinafine (LAMISIL) 250 MG tablet Take 250 mg by mouth daily. 03/15/21   [provider]    Allergies    Levofloxacin,  Toradol [ketorolac tromethamine], Tramadol, Levomenol, Other, Chamomile, Hctz [hydrochlorothiazide], Lisinopril, Losartan potassium, Penicillins, and Triamcinolone acetonide  Review of Systems   Review of Systems  Cardiovascular:  Positive for chest pain.  All other systems reviewed and are negative.  Physical Exam Updated Vital Signs BP 134/77   Pulse 87   Temp 98.3 F (36.8 C) (Oral)   Resp (!) 29   Ht 1.6 m ('5\' 3"'$ )   Wt 121.6 kg   LMP 10/09/2014   SpO2 99%   BMI 47.47 kg/m   Physical Exam Vitals and nursing note reviewed.  Constitutional:      General: She is not in acute distress.    Appearance: She is well-developed.  HENT:     Head: Normocephalic and atraumatic.     Mouth/Throat:     Pharynx: No oropharyngeal exudate.  Eyes:     General: No scleral icterus.       Right eye: No discharge.        Left eye: No discharge.     Conjunctiva/sclera: Conjunctivae normal.     Pupils: Pupils are equal, round, and reactive to light.  Neck:     Thyroid: No thyromegaly.     Vascular: No JVD.  Cardiovascular:     Rate and Rhythm: Normal rate and regular rhythm.     Heart sounds: Normal heart sounds. No murmur heard.   No friction rub. No gallop.  Pulmonary:     Effort: Pulmonary effort is normal. No respiratory distress.     Breath sounds: Normal breath sounds. No wheezing or rales.  Abdominal:     General: Bowel sounds are normal. There is no distension.     Palpations: Abdomen is soft. There is no mass.     Tenderness: There is no abdominal tenderness.  Musculoskeletal:        General: No tenderness. Normal range of motion.     Cervical back:  Normal range of motion and neck supple.     Right lower leg: No edema.     Left lower leg: No edema.  Lymphadenopathy:     Cervical: No cervical adenopathy.  Skin:    General: Skin is warm and dry.     Findings: No erythema or rash.  Neurological:     Mental Status: She is alert.     Coordination: Coordination normal.   Psychiatric:        Behavior: Behavior normal.    ED Results / Procedures / Treatments   Labs (all labs ordered are listed, but only abnormal results are displayed) Labs Reviewed  BASIC METABOLIC PANEL - Abnormal; Notable for the following components:      Result Value   Glucose, Bld 183 (*)    All other components within normal limits  CBC  TROPONIN I (HIGH SENSITIVITY)    EKG EKG Interpretation  Date/Time:  Friday May 10 2021 19:44:58 EDT Ventricular Rate:  84 PR Interval:  146 QRS Duration: 70 QT Interval:  398 QTC Calculation: 470 R Axis:   56 Text Interpretation: Normal sinus rhythm Normal ECG Confirmed by Noemi Chapel [phone]) on 05/10/2021 9:10:37 PM  Radiology DG Chest 2 View  Result Date: 05/10/2021 CLINICAL DATA:  Chest pain EXAM: CHEST - 2 VIEW COMPARISON:  12/09/2020 FINDINGS: Stable cardiomediastinal contours. No focal airspace consolidation, pleural effusion, or pneumothorax. No acute osseous findings. IMPRESSION: No active cardiopulmonary disease. Electronically Signed   By: Davina Poke D.O.   On: 05/10/2021 20:16    Procedures Procedures   Medications Ordered in ED Medications - No data to display  ED Course  I have reviewed the triage vital signs and the nursing notes.  Pertinent labs & imaging results that were available during my care of the patient were reviewed by me and considered in my medical decision making (see chart for details).    MDM Rules/Calculators/A&P                           This patient's exam is rather unremarkable, her vital signs are unremarkable, her EKG is also unremarkable and reassuring.  The chest x-ray shows no abnormalities and the electrolytes and work-up shows normal metabolic panel without any significant electrolyte or renal abnormalities.  Her glucose was 183, CBC is normal and the troponin was undetectable at 4.  She has had multiple cardiac exams with troponin measurements in the past going back to July of  last year on 4 separate occasions prior to today and never has there been an elevated troponin.  I think this is less likely to be related to coronary disease and more likely to be related to a gastrointestinal source.  She is otherwise stable for discharge, no signs of pulmonary embolism, coronary disease and I doubt that this is an aortic dissection or other pathological cause of her pain.  The patient was updated on the results and is agreeable to the plan of discharge and follow-up in the outpatient setting  Final Clinical Impression(s) / ED Diagnoses Final diagnoses:  Atypical chest pain     Noemi Chapel, MD 05/10/21 2139

## 2021-05-10 NOTE — ED Triage Notes (Signed)
Pt with mid chest the other night and pain has ran across chest as well. Pt has no thyroid and on medication. 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

## 2021-05-10 NOTE — ED Notes (Signed)
ED Provider at bedside. 

## 2021-05-10 NOTE — Discharge Instructions (Addendum)
Your testing showed no signs of cardiac disease, totally normal testing with regards to your heart and your lungs, your blood work was normal  Thank you for letting us take care of you today!  Please obtain all of your results from medical records or have your doctors office obtain the results - share them with your doctor - you should be seen at your doctors office in the next 2 days. Call today to arrange your follow up. Take the medications as prescribed. Please review all of the medicines and only take them if you do not have an allergy to them. Please be aware that if you are taking birth control pills, taking other prescriptions, ESPECIALLY ANTIBIOTICS may make the birth control ineffective - if this is the case, either do not engage in sexual activity or use alternative methods of birth control such as condoms until you have finished the medicine and your family doctor says it is OK to restart them. If you are on a blood thinner such as COUMADIN, be aware that any other medicine that you take may cause the coumadin to either work too much, or not enough - you should have your coumadin level rechecked in next 7 days if this is the case.  ?  It is also a possibility that you have an allergic reaction to any of the medicines that you have been prescribed - Everybody reacts differently to medications and while MOST people have no trouble with most medicines, you may have a reaction such as nausea, vomiting, rash, swelling, shortness of breath. If this is the case, please stop taking the medicine immediately and contact your physician.   If you were given a medication in the ED such as percocet, vicodin, or morphine, be aware that these medicines are sedating and may change your ability to take care of yourself adequately for several hours after being given this medicines - you should not drive or take care of small children if you were given this medicine in the Emergency Department or if you have been  prescribed these types of medicines. ?   You should return to the ER IMMEDIATELY if you develop severe or worsening symptoms.

## 2021-05-11 NOTE — ED Provider Notes (Signed)
[facility] EMERGENCY DEPARTMENT Provider Note   CSN: [id number] Arrival date & time: 04/29/21  1735     History No chief complaint on file.   Heather Fry is a 45 y.o. female.  HPI        Heather Fry is a 45 y.o. female with past medical history of anxiety, Graves' disease, and GERD who presents to the Emergency Department requesting evaluation for possible dehydration.  Patient states that she was working outside became overheated several hours prior to arrival.  She is attempted to rehydrate herself with oral fluids.  She is concerned that her potassium may be too low as well.  She denies any chest pain, shortness of breath, dizziness, headache, nausea or vomiting.  Past Medical History:  Diagnosis Date   Anxiety    Arthritis    Depression    Diverticulosis    GERD (gastroesophageal reflux disease)    Graves disease    Internal hemorrhoids    Thyroid disease    Uterine fibroid     Patient Active Problem List   Diagnosis Date Noted   Irritable bowel syndrome 04/17/2021   Weight gain 12/20/2020   Diarrhea 05/08/2020   Idiopathic peripheral neuropathy 04/18/2020   Myelopathy ([place]) 04/18/2020   OSA on CPAP 04/18/2020   RLS (restless legs syndrome) 04/18/2020   Sensory disturbance 01/20/2020   Atypical chest pain 01/20/2020   Obesity, Class III, BMI 40-49.9 (morbid obesity) ([place]) 01/20/2020   Weakness 01/19/2020   Hyperglycemia 10/04/2019   Myalgia 09/05/2019   Vitamin B 12 deficiency 07/28/2019   Right sided numbness 06/29/2019   Constipation 06/15/2019   Elevated lipase 06/15/2019   Hypomagnesemia 03/29/2019   Leukocytosis 03/29/2019   Pain of upper abdomen 02/10/2019   Nausea without vomiting 02/10/2019   Tingling of face 11/30/2018   Hot flashes 11/30/2018   Vitamin D deficiency 10/06/2018   GERD (gastroesophageal reflux disease) 01/28/2017   Rectal bleeding 01/28/2017   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   ABDOMINAL HYSTERECTOMY     CHOLECYSTECTOMY N/A 11/06/2014   Procedure: LAPAROSCOPIC CHOLECYSTECTOMY;  Surgeon: Jamesetta So, MD;  Location: [facility];  Service: General;  Laterality: N/A;   COLONOSCOPY N/A 03/04/2017   Dr. Gala Romney: Hemorrhoids, diverticulosis, benign polyp without adenomatous changes.  Next colonoscopy 10 years   DILATION AND CURETTAGE OF UTERUS     ESOPHAGOGASTRODUODENOSCOPY N/A 10/02/2014   Dr. Gala Romney: fundic gland polyps, hiatal hernia   ESOPHAGOGASTRODUODENOSCOPY  07/2020   [facility] ; normal esophagus biopsied, benign-appearing gastric polyps biopsied, gastric biopsies obtained to evaluate for H. pylori, normal duodenum.  Pathology with mild chronic gastritis, negative for H. pylori, fundic gland polyps, esophageal biopsy benign.   MOUTH SURGERY     extraction of teeth   POLYPECTOMY  03/04/2017   Procedure: POLYPECTOMY;  Surgeon: Daneil Dolin, MD;  Location: [facility];  Service: Endoscopy;;  colon   THYROIDECTOMY N/A 09/30/2018   Procedure: TOTAL THYROIDECTOMY;  Surgeon: Armandina Gemma, MD;  Location: [facility];  Service: General;  Laterality: N/A;   TUBAL LIGATION       OB History     Gravida  2   Para  2   Term  2  Preterm      AB      Living  2      SAB      IAB      Ectopic      Multiple      Live Births              Family History  Problem Relation Age of Onset   Uterine cancer Mother        ?cervical cancer   Hypertension Father    Diabetes Father    Uterine cancer Sister        suicide   Colon cancer Paternal Aunt 76   Depression Other    Thyroid disease Neg Hx    Pancreatic cancer Neg Hx     Social History   Tobacco Use   Smoking status: Former    Packs/day: 1.00    Years: 5.00    Pack years:  5.00    Types: Cigarettes    Quit date: 11/03/1997    Years since quitting: 23.5   Smokeless tobacco: Never   Tobacco comments:    1 pack 1 week  Vaping Use   Vaping Use: Never used  Substance Use Topics   Alcohol use: No    Alcohol/week: 0.0 standard drinks   Drug use: No    Home Medications Prior to Admission medications   Medication Sig Start Date End Date Taking? Authorizing Provider  acarbose (PRECOSE) 25 MG tablet Take 25 mg by mouth 3 (three) times daily. 04/22/21   [provider]  acetaminophen (TYLENOL) 500 MG tablet Take 1,000 mg by mouth daily as needed for moderate pain.    [provider]  albuterol (VENTOLIN HFA) 108 (90 Base) MCG/ACT inhaler Inhale 2 puffs into the lungs every 6 (six) hours as needed. Patient taking differently: Inhale 2 puffs into the lungs every 6 (six) hours as needed for wheezing or shortness of breath. 12/13/19   Julian Hy, DO  cetirizine (ZYRTEC) 10 MG tablet Take 10 mg by mouth daily.    [provider]  Cholecalciferol (VITAMIN D3) 125 MCG (5000 UT) CAPS Take 1 capsule (5,000 Units total) by mouth daily. Patient taking differently: Take 10,000 Units by mouth daily. 10/13/19   Renato Shin, MD  Continuous Blood Gluc Sensor (FREESTYLE LIBRE 2 SENSOR) MISC 1 Device by Does not apply route every 14 (fourteen) days. 12/27/20   Renato Shin, MD  dicyclomine (BENTYL) 10 MG capsule Take 1 capsule (10 mg total) by mouth up to 3 times daily before meals and at bedtime for abdominal cramping or diarrhea.  Hold in the setting of constipation. 04/17/21   Erenest Rasher, PA-C  EPINEPHrine 0.3 mg/0.3 mL IJ SOAJ injection Inject 0.3 mg into the muscle as needed for anaphylaxis. 08/10/20   [provider]  escitalopram (LEXAPRO) 20 MG tablet Take 1 tablet (20 mg total) by mouth at bedtime. 01/26/19   Renato Shin, MD  fluticasone (FLONASE) 50 MCG/ACT nasal spray Place 2 sprays into both nostrils at bedtime.    [provider]  ipratropium (ATROVENT) 0.02 % nebulizer solution Take 0.5 mg by nebulization daily.    [provider]  LIDOCAINE-MENTHOL ROLL-ON EX Apply 1 application topically daily as needed (pain).    [provider]  losartan (COZAAR) 25 MG tablet Take 1 tablet (25 mg total) by mouth daily. Patient not taking: No sig reported 05/31/20 08/29/20  Donato Heinz, MD  Magnesium 250 MG TABS Take 250 mg  by mouth at bedtime.     [provider]  NON FORMULARY at bedtime. CPAP at night    [provider]  nystatin cream (MYCOSTATIN) Apply 1 application topically daily as needed (toe fungus). 03/15/21   [provider]  ondansetron (ZOFRAN-ODT) 4 MG disintegrating tablet Take 4 mg by mouth every 8 (eight) hours as needed. 04/11/21   [provider]  pantoprazole (PROTONIX) 40 MG tablet Take 1 tablet (40 mg total) by mouth 2 (two) times daily. 04/17/21   Erenest Rasher, PA-C  RESTASIS 0.05 % ophthalmic emulsion Place 1 drop into both eyes 2 (two) times daily. 12/26/19   [provider]  Simethicone 250 MG CAPS Take 500 mg by mouth at bedtime.    [provider]  sucralfate (CARAFATE) 1 g tablet Take 1 g by mouth daily. 08/20/20   [provider]  SYNTHROID 150 MCG tablet Take 150 mcg by mouth daily. 04/06/21   [provider]  terbinafine (LAMISIL) 250 MG tablet Take 250 mg by mouth daily. 03/15/21   [provider]    Allergies    Levofloxacin, Toradol [ketorolac tromethamine], Tramadol, Levomenol, Other, Chamomile, Hctz [hydrochlorothiazide], Lisinopril, Losartan potassium, Penicillins, and Triamcinolone acetonide  Review of Systems   Review of Systems  Constitutional:  Negative for chills, fatigue and fever.  HENT:  Negative for congestion and trouble swallowing.   Eyes:  Negative for visual disturbance.  Respiratory:  Negative for cough and shortness of breath.   Cardiovascular:  Negative  for chest pain and palpitations.  Gastrointestinal:  Positive for nausea. Negative for abdominal pain and vomiting.  Genitourinary:  Negative for dysuria, flank pain and hematuria.  Musculoskeletal:  Negative for arthralgias, back pain, myalgias, neck pain and neck stiffness.  Skin:  Negative for rash.  Neurological:  Negative for dizziness, syncope, weakness, numbness and headaches.  Hematological:  Does not bruise/bleed easily.  Psychiatric/Behavioral:  Negative for confusion.    Physical Exam Updated Vital Signs BP (!) 145/86 (BP Location: Left Arm)   Pulse 81   Temp 98.2 F (36.8 C) (Oral)   Resp 18   LMP 10/09/2014   SpO2 100%   Physical Exam Vitals and nursing note reviewed.  Constitutional:      Appearance: Normal appearance. She is not ill-appearing or toxic-appearing.  HENT:     Head: Normocephalic.     Mouth/Throat:     Mouth: Mucous membranes are moist.  Eyes:     Conjunctiva/sclera: Conjunctivae normal.     Pupils: Pupils are equal, round, and reactive to light.  Neck:     Thyroid: No thyromegaly.     Meningeal: Kernig's sign absent.  Cardiovascular:     Rate and Rhythm: Normal rate and regular rhythm.     Pulses: Normal pulses.  Pulmonary:     Effort: Pulmonary effort is normal.     Breath sounds: Normal breath sounds. No wheezing.  Abdominal:     Palpations: Abdomen is soft.     Tenderness: There is no abdominal tenderness. There is no guarding or rebound.  Musculoskeletal:        General: Normal range of motion.     Cervical back: Normal range of motion.     Right lower leg: No edema.     Left lower leg: No edema.  Skin:    General: Skin is warm.     Findings: No rash.  Neurological:     Mental Status: She is alert and oriented to person, place,  and time.    ED Results / Procedures / Treatments   Labs (all labs ordered are listed, but only abnormal results are displayed) Labs Reviewed  COMPREHENSIVE METABOLIC PANEL - Abnormal; Notable for the  following components:      Result Value   Potassium 3.4 (*)    Glucose, Bld 184 (*)    Calcium 8.8 (*)    AST 49 (*)    All other components within normal limits  URINALYSIS, ROUTINE W REFLEX MICROSCOPIC - Abnormal; Notable for the following components:   Hgb urine dipstick SMALL (*)    Bacteria, UA RARE (*)    All other components within normal limits  CBC WITH DIFFERENTIAL/PLATELET  MAGNESIUM    EKG None  Radiology   Procedures Procedures   Medications Ordered in ED Medications  sodium chloride 0.9 % bolus 1,000 mL (0 mLs Intravenous Stopped 04/29/21 2240)    ED Course  I have reviewed the triage vital signs and the nursing notes.  Pertinent labs & imaging results that were available during my care of the patient were reviewed by me and considered in my medical decision making (see chart for details).    MDM Rules/Calculators/A&P                           Pt here requesting evaluation for possible dehydration.  She denies any vomiting or diarrhea.  No headache or dizziness.  No syncope.  On exam, patient well-appearing nontoxic.  Labs essentially reassuring.  Potassium is 3.4.  Clinical signs of dehydration or AKI.  Patient reports feeling better after IV fluids.  No concerning symptoms for acute process.  Patient reassured she is agreeable to continue oral hydration and has potassium at home and will follow-up with PCP.  She appears appropriate for discharge home, patient agreeable to plan.    Final Clinical Impression(s) / ED Diagnoses Final diagnoses:  Hypokalemia    Rx / DC Orders ED Discharge Orders     None        Kem Parkinson, PA-C 05/11/21 [id number]    LongWonda Olds, MD 05/16/21 [phone]

## 2021-05-14 ENCOUNTER — Other Ambulatory Visit: Payer: Self-pay

## 2021-05-14 ENCOUNTER — Ambulatory Visit (HOSPITAL_COMMUNITY)
Admission: RE | Admit: 2021-05-14 | Discharge: 2021-05-14 | Disposition: A | Discharge status: Home / Self Care | Payer: Medicaid Other | Source: Ambulatory Visit | Attending: Gastroenterology | Admitting: Gastroenterology

## 2021-05-14 DIAGNOSIS — R1010 Upper abdominal pain, unspecified: Secondary | ICD-10-CM | POA: Diagnosis present

## 2021-05-14 DIAGNOSIS — R748 Abnormal levels of other serum enzymes: Secondary | ICD-10-CM | POA: Insufficient documentation

## 2021-05-14 IMAGING — CT CT ABDOMEN WO/W CM
3 of 12 series · 10 of 46 positions shown, 16 images · IV contrast (Omnipaque or Isovue)
Comparison: CT [DATE]

CLINICAL DATA: Intermittently elevated lipase and upper abdominal
pain.

EXAM:
CT ABDOMEN WITHOUT AND WITH CONTRAST
TECHNIQUE: Multidetector CT imaging of the abdomen was performed following the
standard protocol before and following the bolus administration of
intravenous contrast.
CONTRAST:  80mL OMNIPAQUE IOHEXOL 350 MG/ML SOLN

[Series 3: axial arterial · axial · arterial · 0.96mm/px · z∈[+1040,+1109]mm · 2 of 93 slices shown]
[im 24/93  soft-tissue]
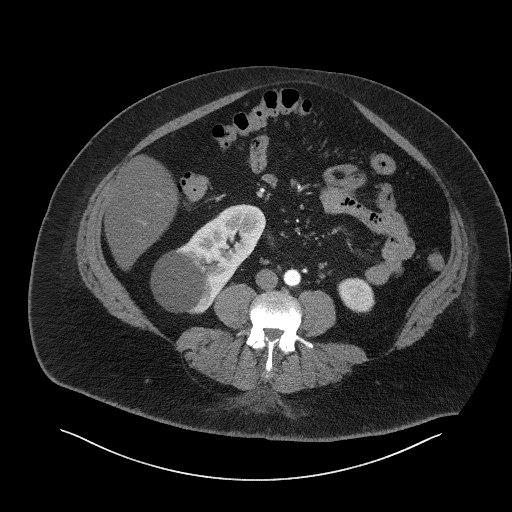
[im 47/93  soft-tissue]
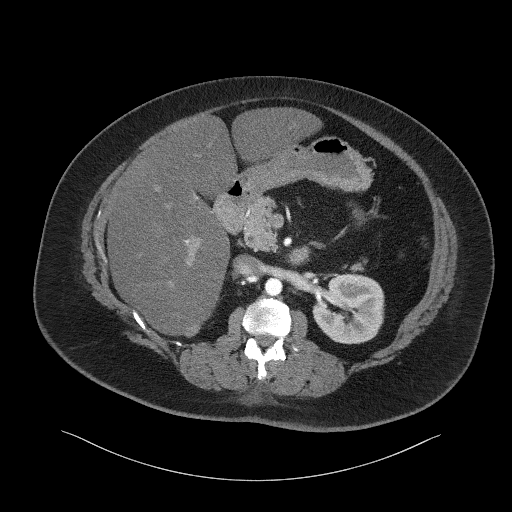

[Series 7: coronal arterial · coronal · arterial · 0.54mm/px · 3 of 123 slices shown, 4 images]
[im 31/123  soft-tissue]
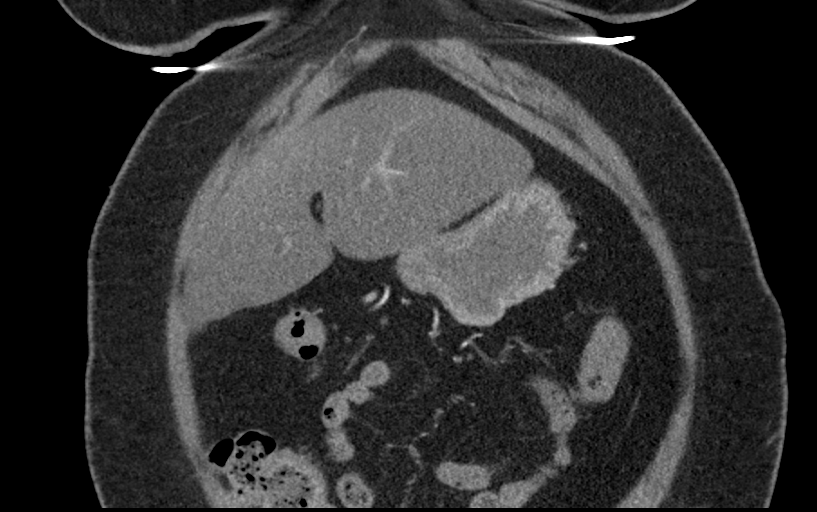
[im 62/123  soft-tissue]
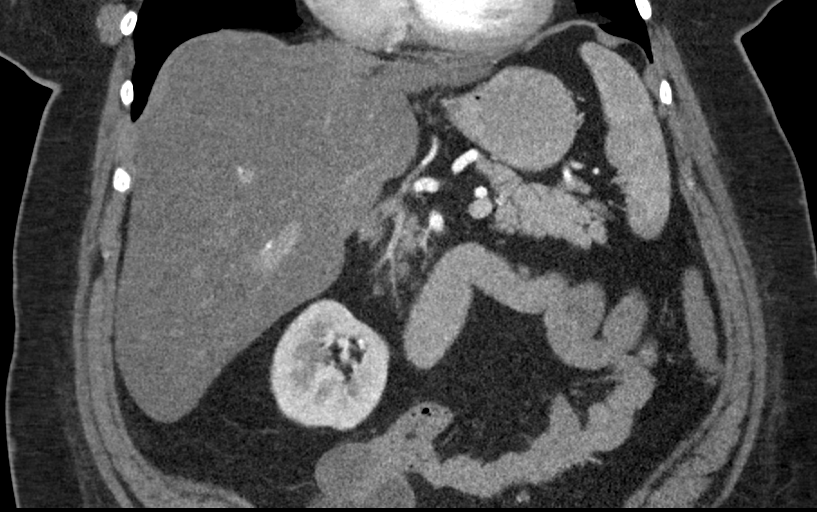
[im 62/123  bone]
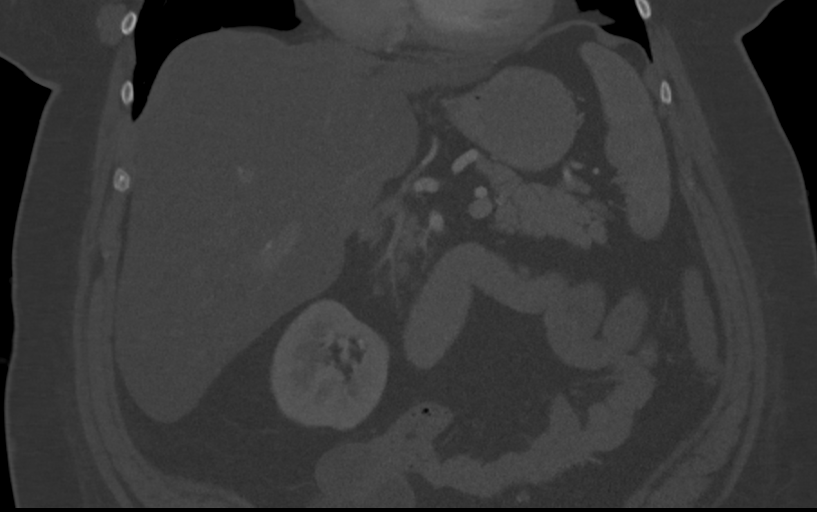
[im 92/123  soft-tissue]
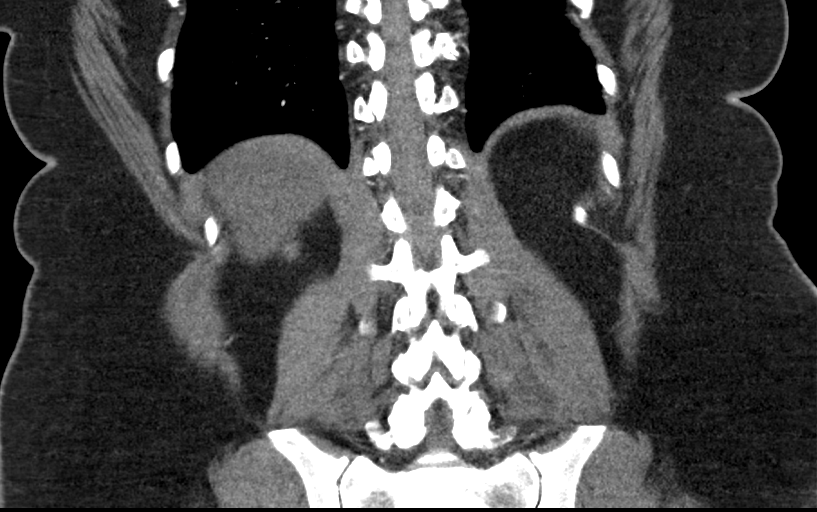

[Series 11: pancreas pv 2.0 · axial · portal-venous · 0.96mm/px · z∈[+1015,+1199]mm · 5 of 140 slices shown, 10 images]
[im 24/140  soft-tissue]
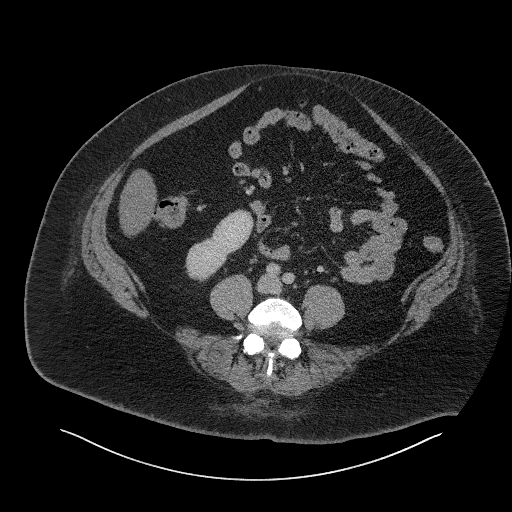
[im 24/140  bone]
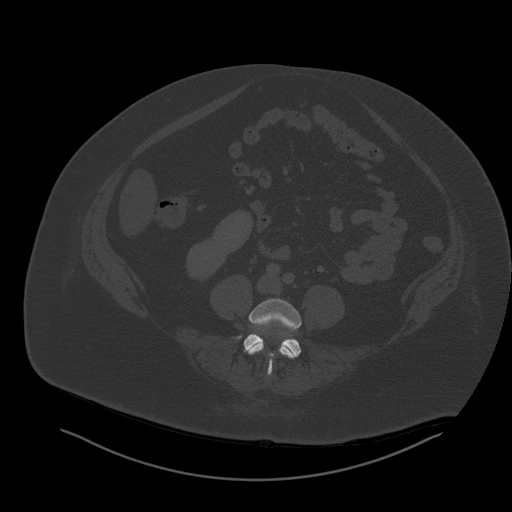
[im 47/140  soft-tissue]
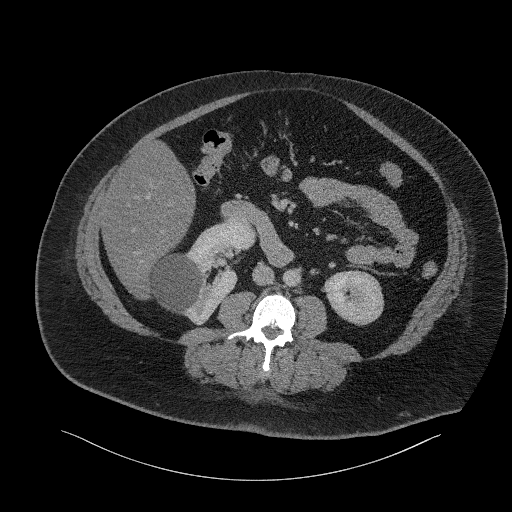
[im 47/140  lung]
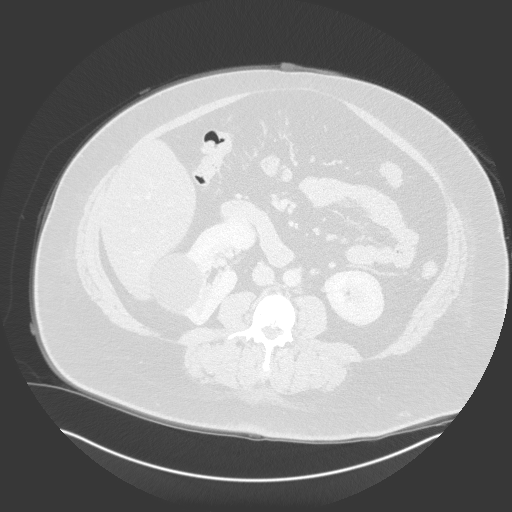
[im 70/140  soft-tissue]
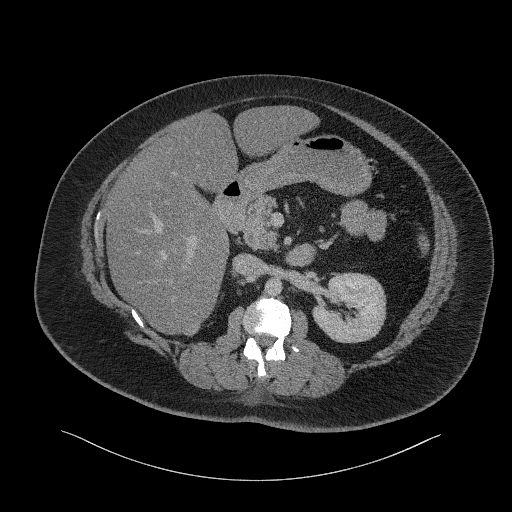
[im 70/140  lung]
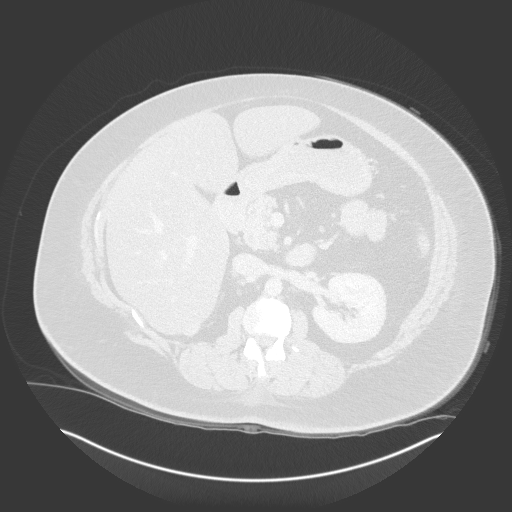
[im 93/140  soft-tissue]
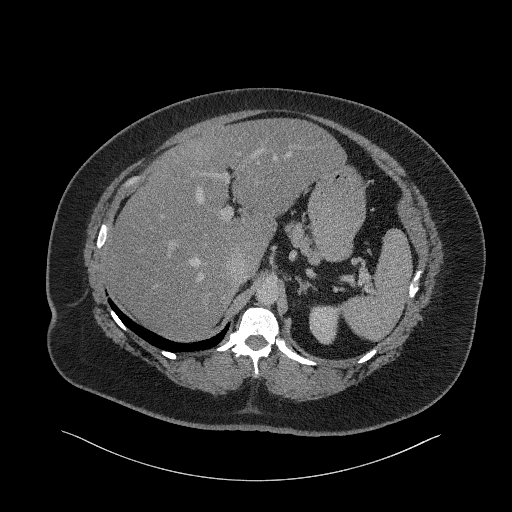
[im 93/140  lung]
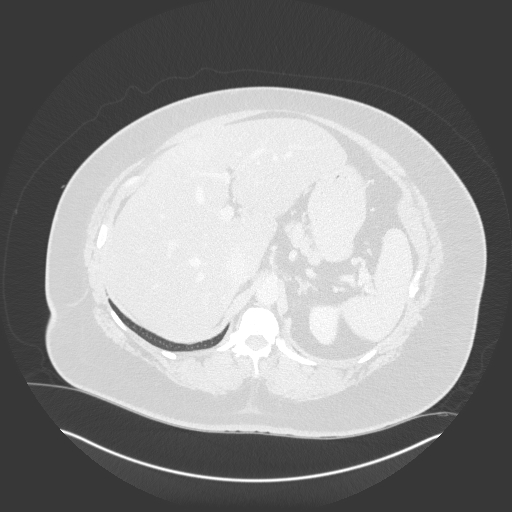
[im 116/140  soft-tissue]
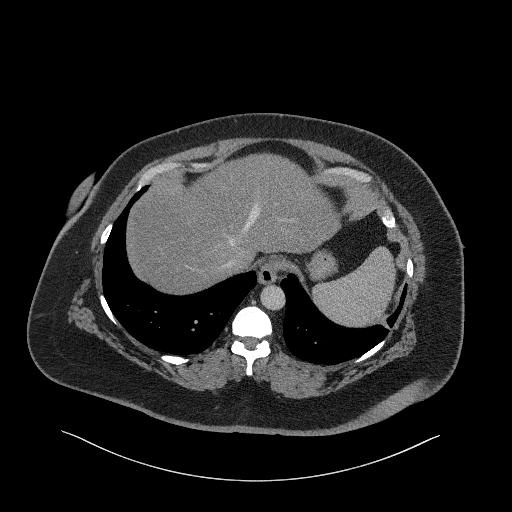
[im 116/140  lung]
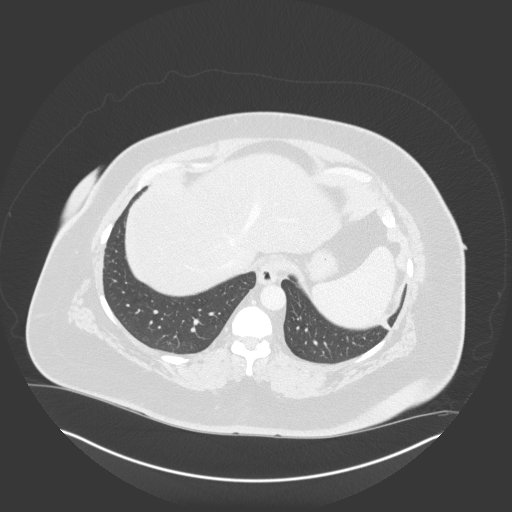

[10 of 46 positions shown; findings below may reference images not displayed]

FINDINGS: Lower chest: Scarring versus atelectasis in the left lung base.

Hepatobiliary: Diffuse hepatic steatosis. Focal hyperdense
nonenhancing nodularity along the posterior aspect of the right lobe
of the liver measuring 11 mm on image 48/3 is stable over multiple
priors dating back to at least [6Z] consistent with a
benign/indolent process. No suspicious hepatic lesions. Gallbladder
surgically absent. No biliary ductal dilation.

Pancreas: Unremarkable. No pancreatic ductal dilatation or
surrounding inflammatory changes.

Spleen: Within normal limits.

Adrenals/Urinary Tract: Adrenal glands are unremarkable. 6.1 cm
right upper pole renal cyst. Kidneys are without renal calculi,
solid enhancing lesion, or hydronephrosis.

Stomach/Bowel: Stomach is within normal limits. Appendix is not
visualized. No evidence of bowel wall thickening, distention, or
inflammatory changes.

Vascular/Lymphatic: No abdominal aortic aneurysm. No pathologically
enlarged abdominal lymph nodes.

Other: Partially visualized cystic lesions visualized in the right
upper pelvis which on prior examination appear to be represent
multiple small cysts in continuity with the right ovary and have
been grossly stable over multiple prior studies although
incompletely evaluated on today's study.

Musculoskeletal: No acute or significant osseous findings.
IMPRESSION: 1. No acute intra-abdominal findings to explain the patient's
abdominal pain. Specifically there is normal enhancement of the
pancreatic parenchyma without evidence of acute inflammation or
pancreatic ductal dilation.
2. Diffuse hepatic steatosis.
3. Partially visualized cystic lesions in the right upper pelvis
which on prior examination appear to be represent multiple smaller
adjacent cysts in continuity with the right ovary, and have been
stable over multiple prior studies although incompletely visualized
on today study.

## 2021-05-14 MED ORDER — IOHEXOL 350 MG/ML SOLN
100.0000 mL | Freq: Once | INTRAVENOUS | Status: AC | PRN
  Administered 2021-05-14: 80 mL via INTRAVENOUS

## 2021-05-17 ENCOUNTER — Other Ambulatory Visit: Payer: Self-pay | Admitting: *Deleted

## 2021-05-17 ENCOUNTER — Other Ambulatory Visit: Payer: Self-pay | Admitting: Gastroenterology

## 2021-05-17 ENCOUNTER — Other Ambulatory Visit: Payer: Self-pay | Admitting: Internal Medicine

## 2021-05-17 DIAGNOSIS — K769 Liver disease, unspecified: Secondary | ICD-10-CM

## 2021-05-23 ENCOUNTER — Ambulatory Visit
Admission: RE | Admit: 2021-05-23 | Discharge: 2021-05-23 | Disposition: A | Discharge status: Home / Self Care | Payer: Medicaid Other | Source: Ambulatory Visit | Attending: Neurosurgery | Admitting: Neurosurgery

## 2021-05-23 DIAGNOSIS — M5030 Other cervical disc degeneration, unspecified cervical region: Secondary | ICD-10-CM

## 2021-05-23 IMAGING — MR MR CERVICAL SPINE W/O CM
4 of 5 series · 27 of 48 positions shown · non-contrast
Comparison: Previous MRI from [DATE].

CLINICAL DATA: Initial evaluation for neck pain with bilateral
shoulder and arm pain.

EXAM:
MRI CERVICAL SPINE WITHOUT CONTRAST
TECHNIQUE: Multiplanar, multisequence MR imaging of the cervical spine was
performed. No intravenous contrast was administered.

[Series 5: T2 · sagittal · 3.0mm · 0.55mm/px · 6 of 15 slices shown (1 of 2)]
[im 1/15]
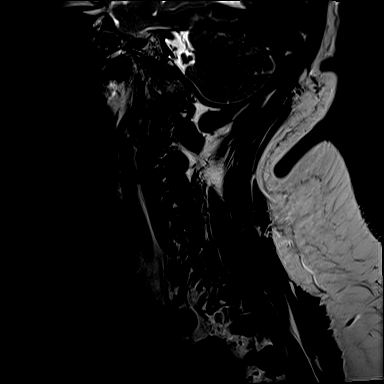
[im 3/15]
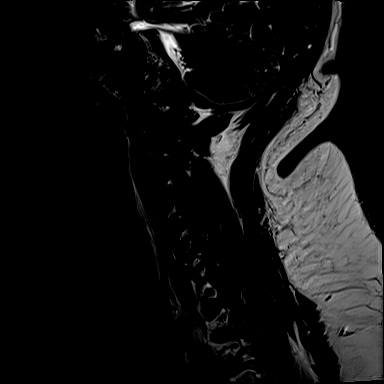
[im 6/15]
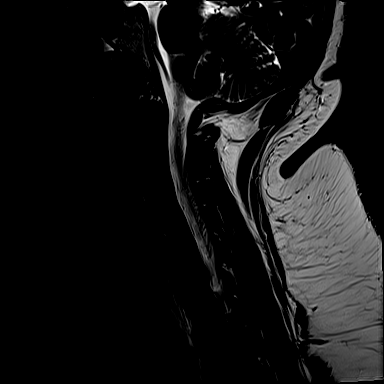
[im 9/15]
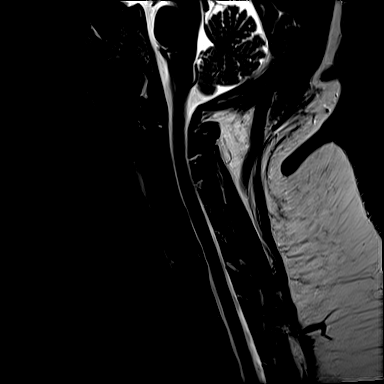
[im 12/15]
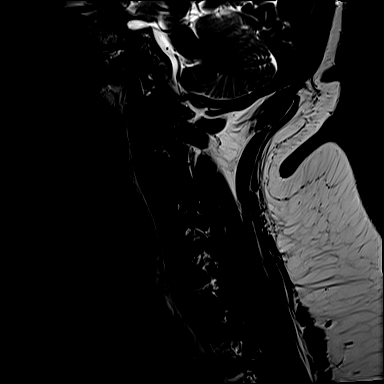
[im 15/15]
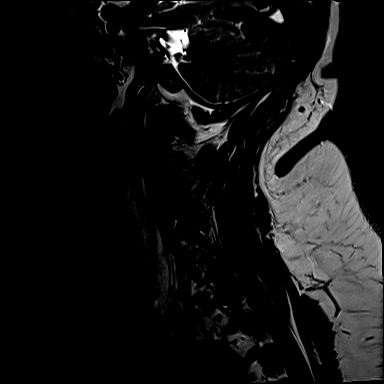

[Series 6: T1 · sagittal · 3.0mm · 0.66mm/px · 7 of 15 slices shown]
[im 1/15]
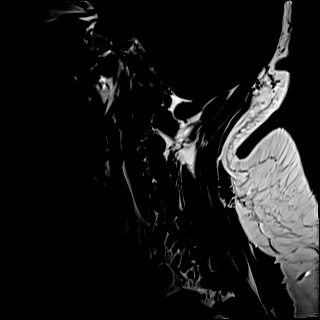
[im 3/15]
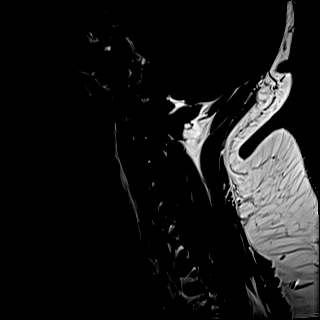
[im 5/15]
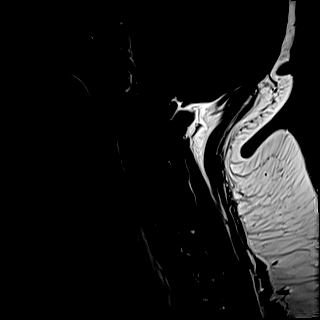
[im 8/15]
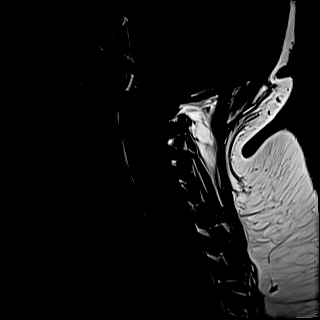
[im 10/15]
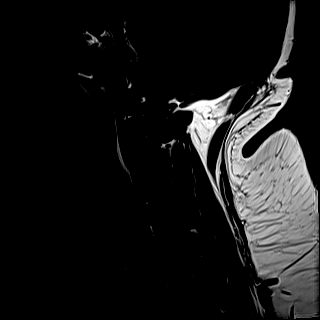
[im 12/15]
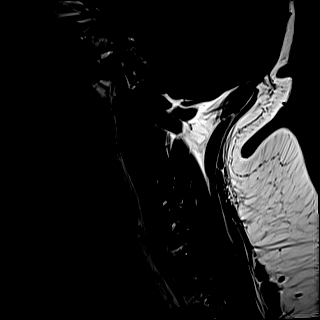
[im 15/15]
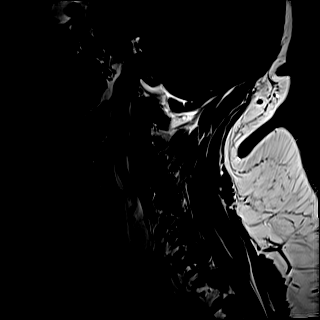

[Series 7: STIR · sagittal · 3.0mm · 0.33mm/px · 6 of 15 slices shown]
[im 1/15]
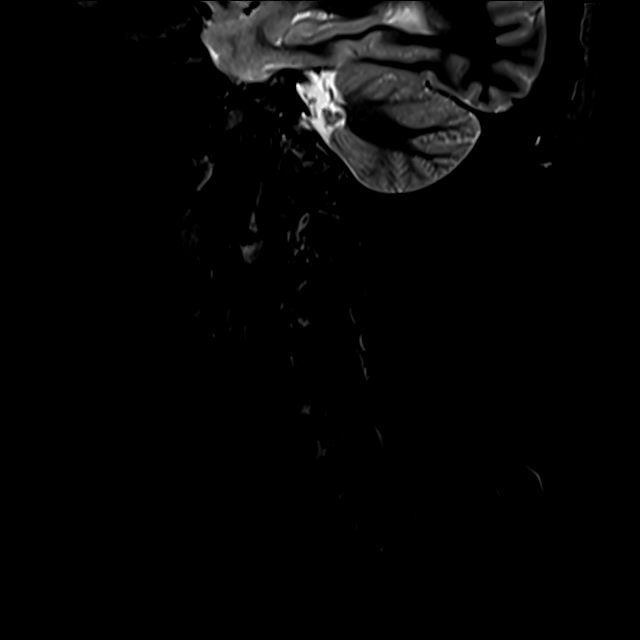
[im 3/15]
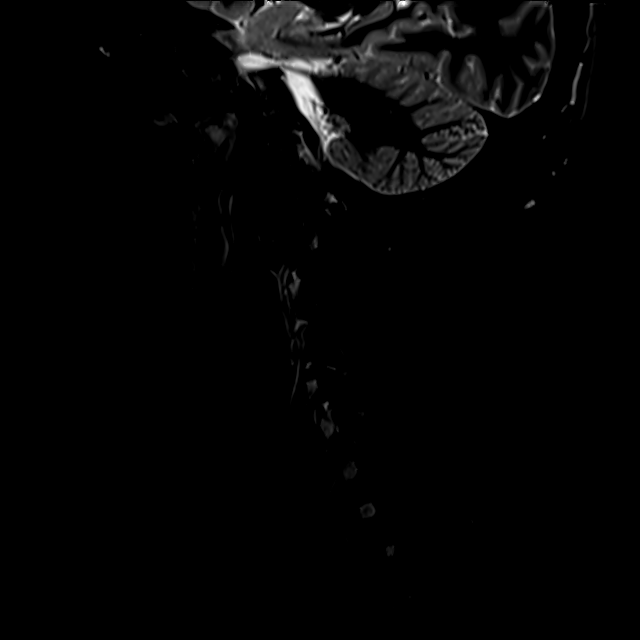
[im 5/15]
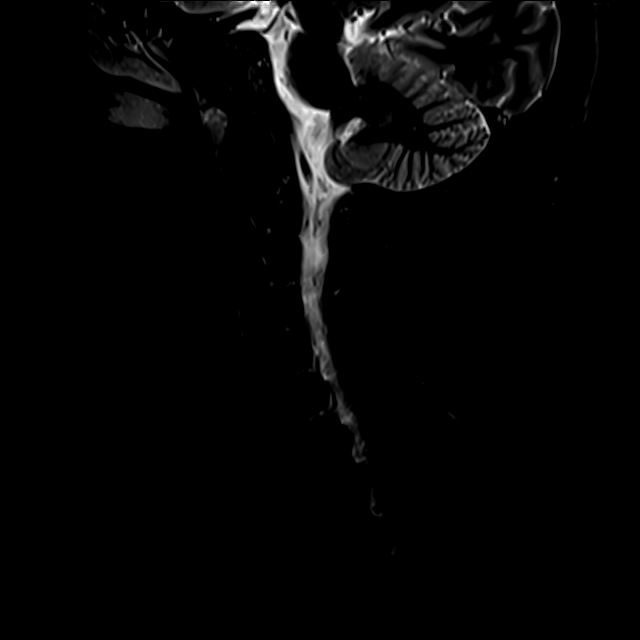
[im 8/15]
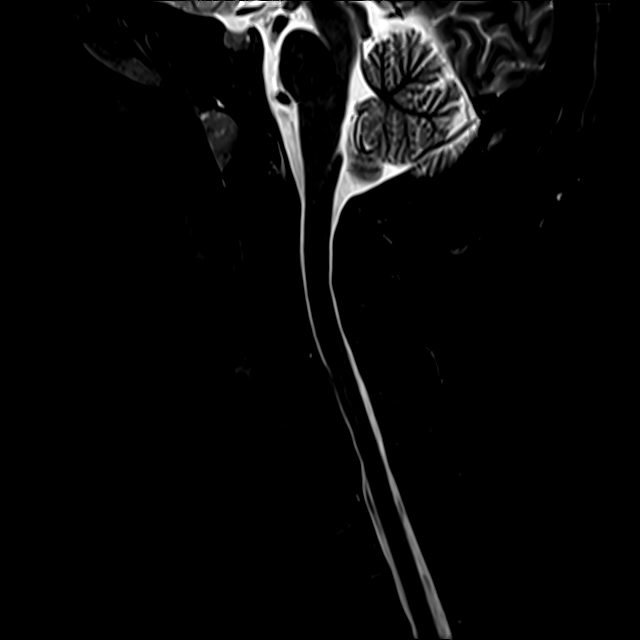
[im 10/15]
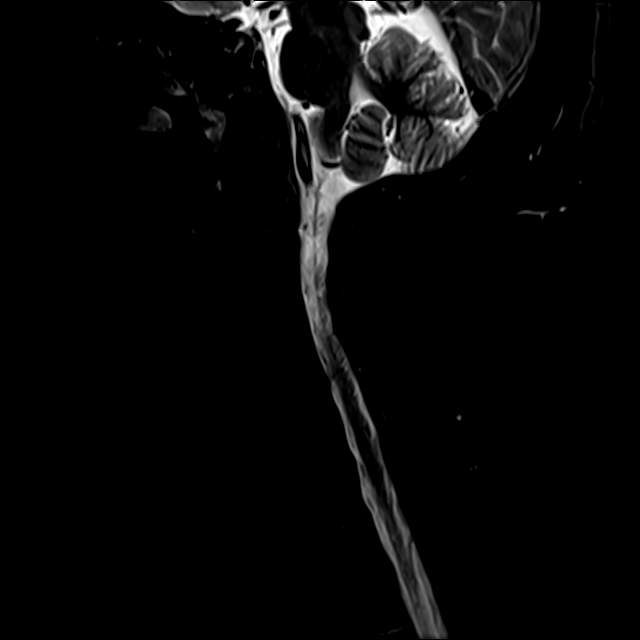
[im 12/15]
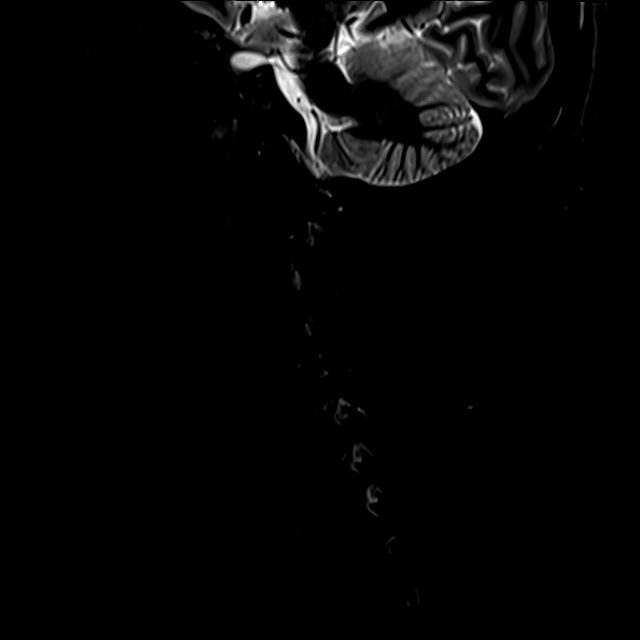

[Series 8: T2 · axial · 3.0mm · 0.50mm/px · z∈[-70,+23]mm · 8 of 30 slices shown (2 of 2)]
[im 1/30]
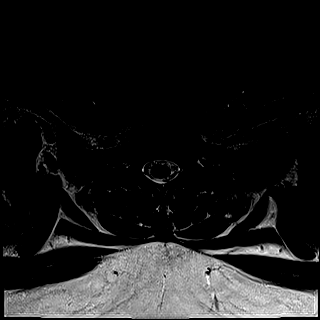
[im 5/30]
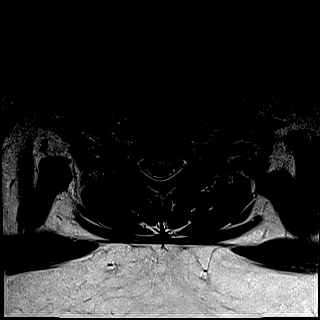
[im 9/30]
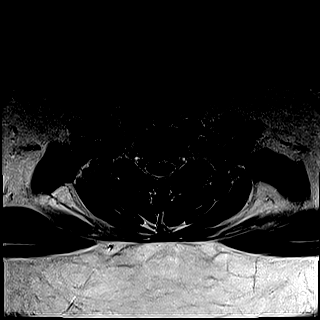
[im 14/30]
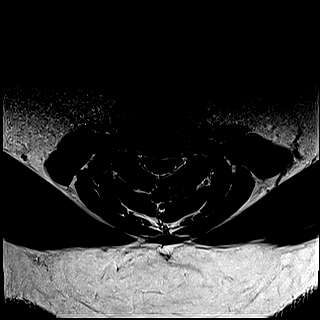
[im 16/30]
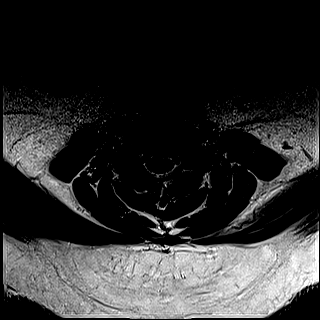
[im 21/30]
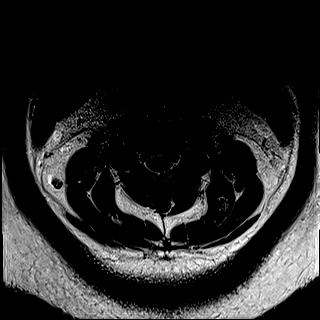
[im 25/30]
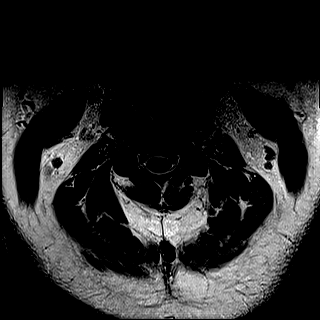
[im 30/30]
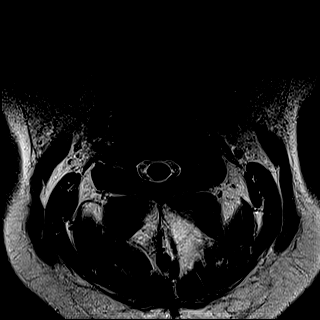

[27 of 48 positions shown; findings below may reference images not displayed]

FINDINGS: Alignment: Straightening with mild reversal of the normal cervical
lordosis. No listhesis.

Vertebrae: Vertebral body height maintained without acute or chronic
fracture. Diffusely decreased T1 signal intensity seen throughout
the visualized bone marrow, nonspecific, but most commonly related
to anemia, smoking, or obesity. No discrete or worrisome osseous
lesions or abnormal marrow edema.

Cord: Normal signal and morphology.

Posterior Fossa, vertebral arteries, paraspinal tissues: Visualized
brain and posterior fossa within normal limits. Craniocervical
junction normal. Paraspinous and prevertebral soft tissues within
normal limits. Normal intravascular flow voids seen within the
vertebral arteries bilaterally.

Disc levels:

C2-C3: Normal interspace. Mild left-sided facet hypertrophy. No
stenosis.

C3-C4:  Unremarkable.

C4-C5: Small right paracentral disc protrusion indents the right
ventral thecal sac, contacting and mildly flattening the ventral
cord (series 9, image 19). Resultant borderline mild spinal
stenosis. Neural foramina remain patent. Appearance is similar.

C5-C6: Small central disc extrusion with inferior migration.
Secondary mild flattening of the ventral cord with borderline mild
spinal stenosis. Foramina remain patent. Appearance is similar.

C6-C7: Broad based right paracentral disc protrusion mildly flattens
the right ventral thecal sac (series 8, image 23). Mild flattening
of the right ventral cord with mild right-sided spinal stenosis.
This has slightly regressed since prior. Foramina remain patent.

C7-T1:  Unremarkable.

Visualized upper thoracic spine demonstrates no significant finding.
IMPRESSION: 1. Right paracentral disc protrusion at C6-7 with secondary mild
flattening of the right ventral cord and mild right-sided spinal
stenosis. This has partially regressed from prior.
2. Small right paracentral disc protrusion at C4-5 with resultant
borderline mild spinal stenosis, stable.
3. Small central disc extrusion with inferior migration at C5-6 with
resultant mild flattening of the ventral cord and borderline mild
spinal stenosis, stable.

## 2021-05-29 ENCOUNTER — Ambulatory Visit (HOSPITAL_COMMUNITY)
Admission: RE | Admit: 2021-05-29 | Discharge: 2021-05-29 | Disposition: A | Discharge status: Home / Self Care | Payer: Medicaid Other | Source: Ambulatory Visit | Attending: Gastroenterology | Admitting: Gastroenterology

## 2021-05-29 ENCOUNTER — Other Ambulatory Visit: Payer: Self-pay

## 2021-05-29 DIAGNOSIS — K769 Liver disease, unspecified: Secondary | ICD-10-CM | POA: Diagnosis present

## 2021-05-29 IMAGING — MR MR ABDOMEN WO/W CM
21 series · 48 of 48 positions shown · IV contrast (gadavist)
Comparison: CT on [DATE]

CLINICAL DATA: Chronic abdominal pain. Abnormal liver on recent CT.

EXAM:
MRI ABDOMEN WITHOUT AND WITH CONTRAST
TECHNIQUE: Multiplanar multisequence MR imaging of the abdomen was performed
both before and after the administration of intravenous contrast.
CONTRAST:  10mL GADAVIST GADOBUTROL 1 MMOL/ML IV SOLN

[Series 4: cor haste · coronal · 6.0mm · 1.56mm/px · 3 of 48 slices shown]
[im 1/48]
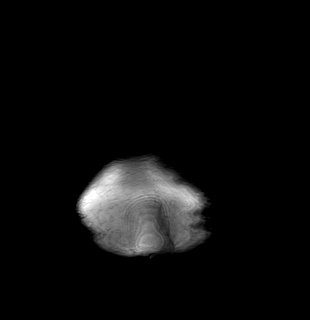
[im 24/48]
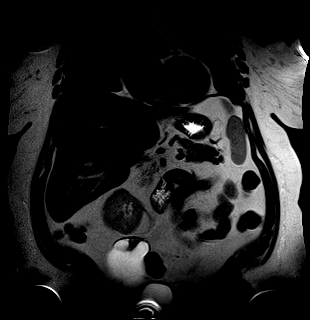
[im 48/48]
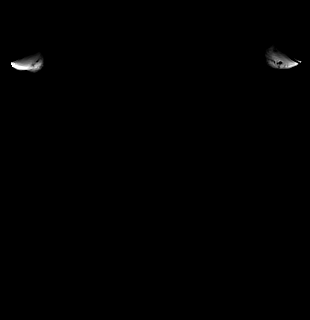

[Series 5: ax haste · axial · 6.0mm · 1.56mm/px · 1 of 42 slices shown]
[im 1/42]
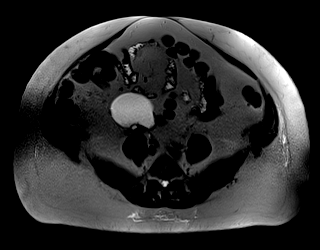

[Series 7: T2 fat-sat · axial · 6.0mm · 1.50mm/px · 1 of 42 slices shown]
[im 1/42]
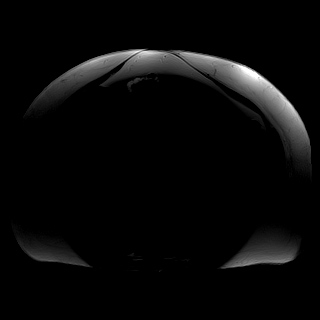

[Series 8: DWI · axial · 6.0mm · 1.87mm/px · 1 of 44 slices shown (1 of 4)]
[im 1/44]
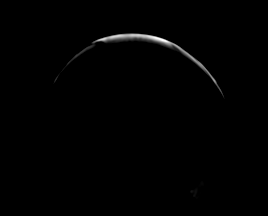

[Series 8: DWI · axial · 6.0mm · 1.87mm/px · 1 of 44 slices shown (2 of 4)]
[im 1/44]
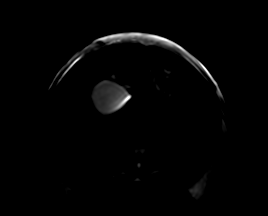

[Series 8: DWI · axial · 6.0mm · 1.87mm/px · 1 of 44 slices shown (3 of 4)]
[im 1/44]
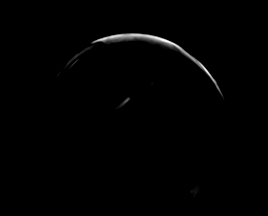

[Series 9: DWI · axial · 6.0mm · 1.87mm/px · 1 of 44 slices shown (4 of 4)]
[im 1/44]
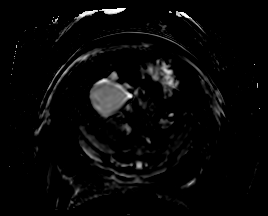

[Series 10: ax in and · axial · 3.0mm · 1.56mm/px · z∈[-164,+73]mm · 3 of 80 slices shown (1 of 2)]
[im 1/80]
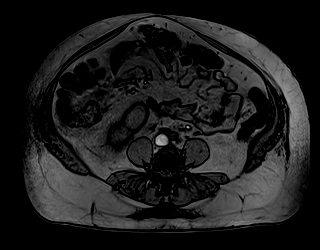
[im 40/80]
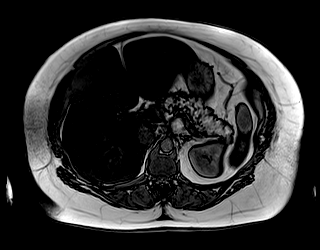
[im 80/80]
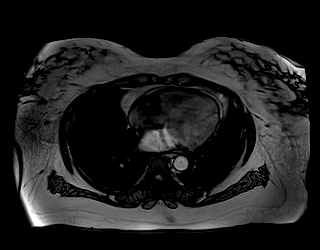

[Series 11: ax in and · axial · 3.0mm · 1.56mm/px · z∈[-164,+73]mm · 3 of 80 slices shown (2 of 2)]
[im 1/80]
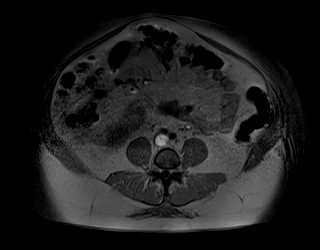
[im 40/80]
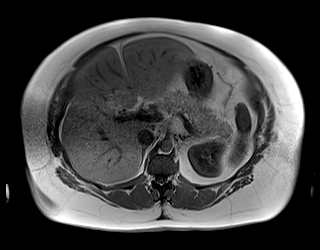
[im 80/80]
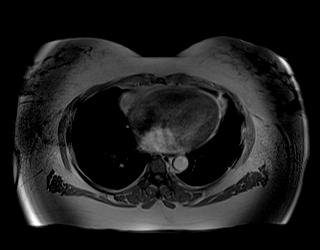

[Series 12: bSSFP · axial · 6.0mm · 0.98mm/px · 1 of 42 slices shown (1 of 2)]
[im 1/42]
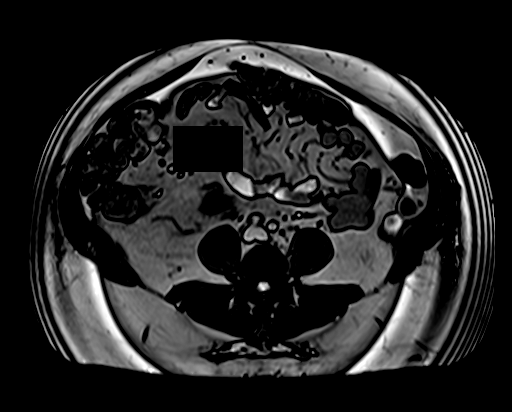

[Series 13: T1 dynamic · axial · non-contrast · 3.0mm · 1.56mm/px · z∈[-176,+85]mm · 3 of 88 slices shown (1 of 4)]
[im 1/88]
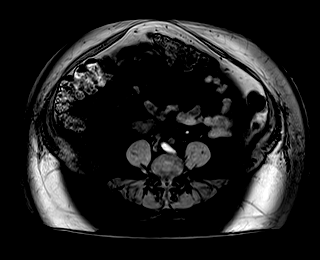
[im 44/88]
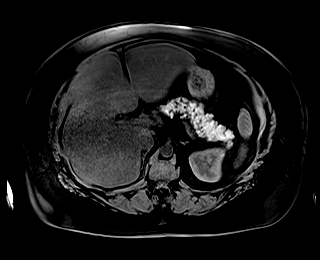
[im 88/88]
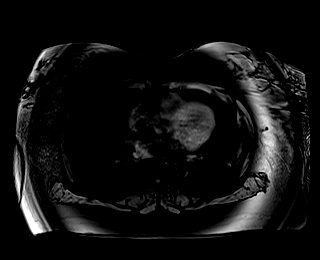

[Series 14: bSSFP · axial · 6.0mm · 0.98mm/px · z∈[-180,+90]mm · 2 of 46 slices shown (2 of 2)]
[im 1/46]
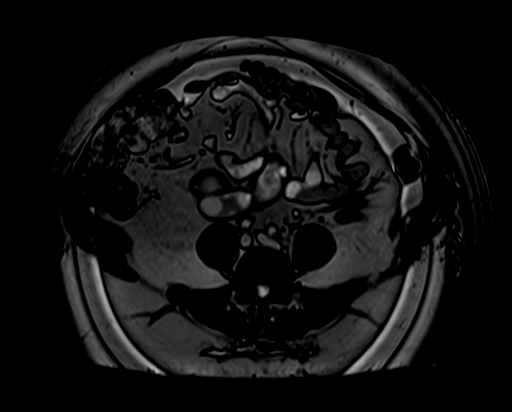
[im 46/46]
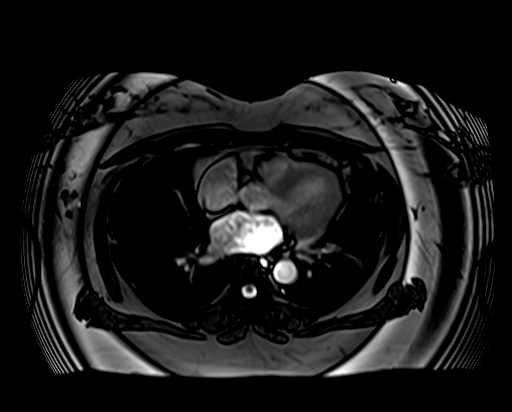

[Series 16: T1 dynamic post-contrast · axial · 3.0mm · 1.56mm/px · z∈[-176,+85]mm · 3 of 88 slices shown (1 of 6)]
[im 1/88]
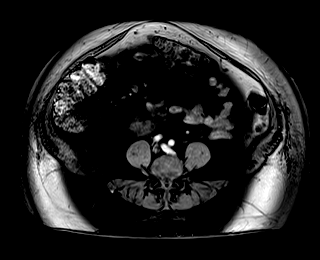
[im 44/88]
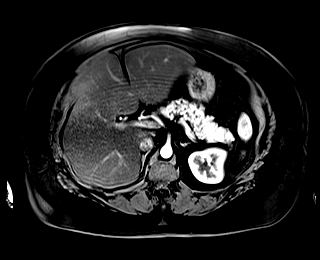
[im 88/88]
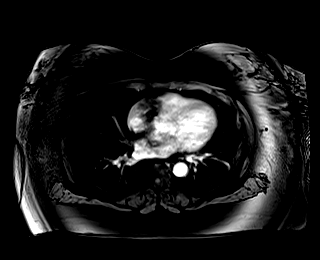

[Series 17: T1 dynamic · axial · 3.0mm · 1.56mm/px · z∈[-176,+85]mm · 3 of 88 slices shown (2 of 4)]
[im 1/88]
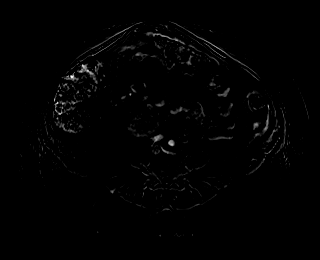
[im 44/88]
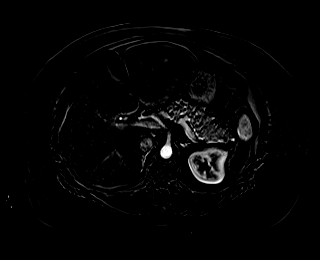
[im 88/88]
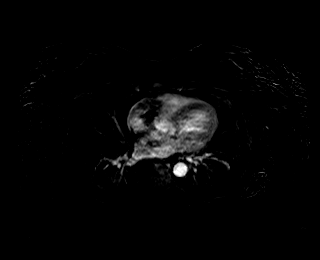

[Series 18: T1 dynamic post-contrast · axial · 3.0mm · 1.56mm/px · z∈[-176,+85]mm · 3 of 88 slices shown (2 of 6)]
[im 1/88]
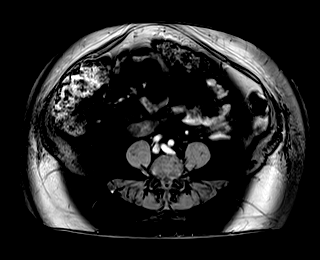
[im 44/88]
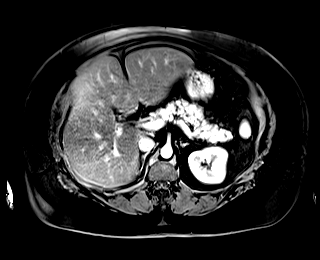
[im 88/88]
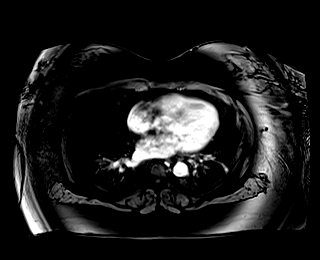

[Series 19: T1 dynamic · axial · 3.0mm · 1.56mm/px · z∈[-176,+85]mm · 3 of 88 slices shown (3 of 4)]
[im 1/88]
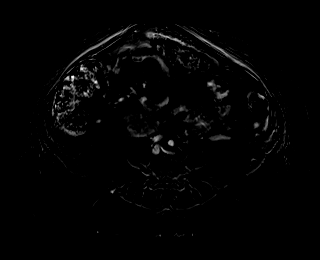
[im 44/88]
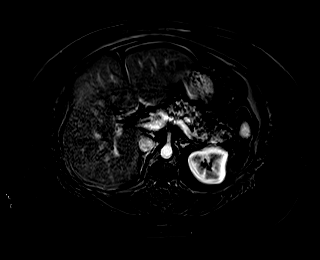
[im 88/88]
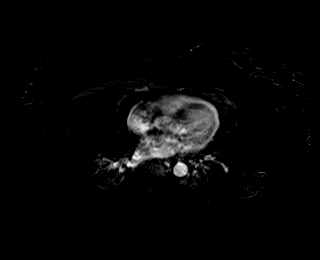

[Series 20: T1 dynamic post-contrast · axial · 3.0mm · 1.56mm/px · z∈[-176,+85]mm · 3 of 88 slices shown (3 of 6)]
[im 1/88]
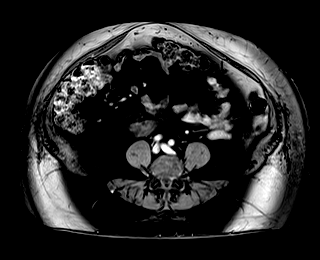
[im 44/88]
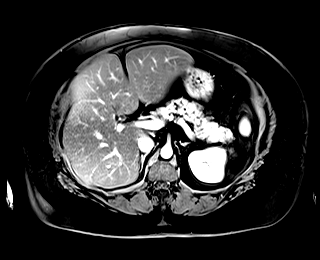
[im 88/88]
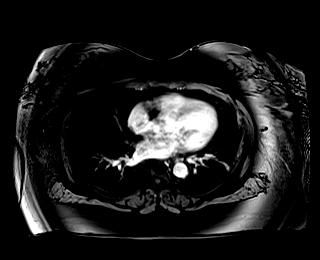

[Series 21: T1 dynamic · axial · 3.0mm · 1.56mm/px · z∈[-176,+85]mm · 3 of 88 slices shown (4 of 4)]
[im 1/88]
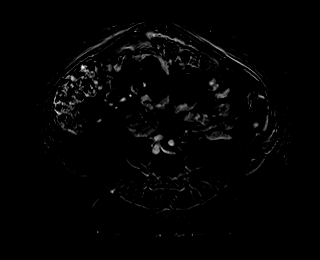
[im 44/88]
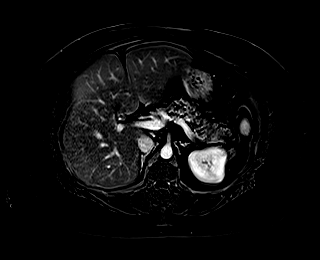
[im 88/88]
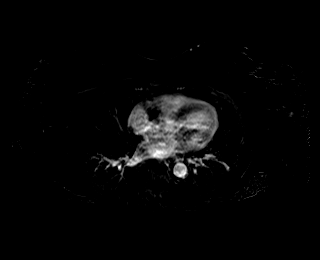

[Series 22: T1 dynamic post-contrast · coronal · 3.0mm · 1.56mm/px · 3 of 80 slices shown (4 of 6)]
[im 1/80]
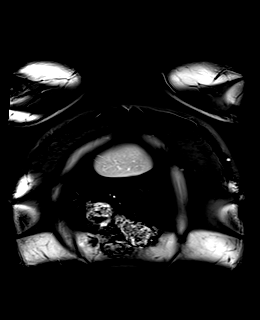
[im 40/80]
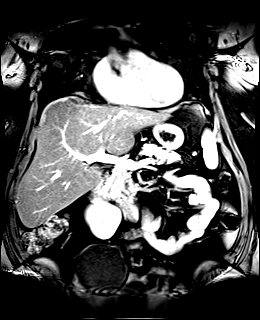
[im 80/80]
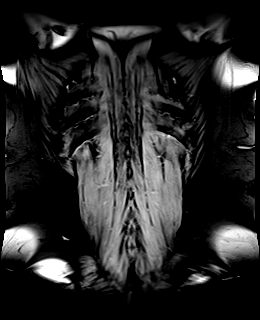

[Series 23: T1 dynamic post-contrast · axial · 3.0mm · 1.56mm/px · z∈[-176,+85]mm · 3 of 88 slices shown (5 of 6)]
[im 1/88]
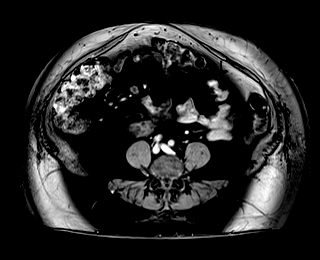
[im 44/88]
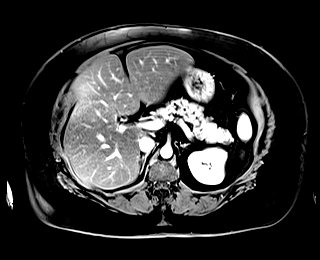
[im 88/88]
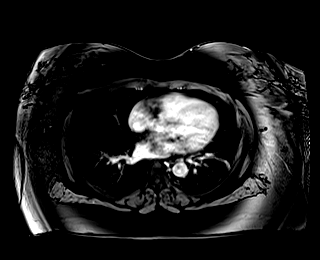

[Series 24: T1 dynamic post-contrast · axial · 3.0mm · 1.56mm/px · z∈[-176,+85]mm · 3 of 88 slices shown (6 of 6)]
[im 1/88]
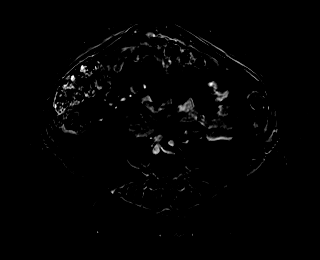
[im 44/88]
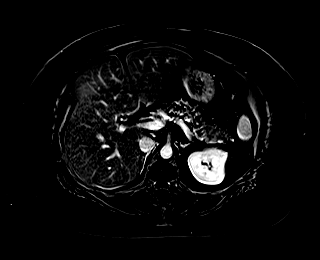
[im 88/88]
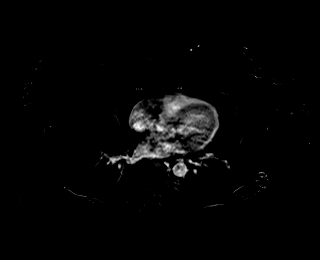

[48 of 48 positions shown; findings below may reference images not displayed]

FINDINGS: Lower chest: No acute findings.

Hepatobiliary: Moderate to severe diffuse hepatic steatosis is seen.
A 1 cm subcapsular hemorrhagic cyst is seen in the posterior right
hepatic lobe which corresponds with the lesion seen on recent CT. No
hepatic masses are identified. Prior cholecystectomy. No evidence of
biliary obstruction.

Pancreas:  No mass or inflammatory changes.

Spleen:  Within normal limits in size and appearance.

Adrenals/Urinary Tract: No masses identified. 6 cm simple right
renal cyst is seen. No evidence of hydronephrosis.

Stomach/Bowel: Visualized portion unremarkable.

Vascular/Lymphatic: No pathologically enlarged lymph nodes
identified. No acute vascular findings.

Other: A cystic lesion in the upper right pelvis is incompletely
visualized on this study, but measures at least 8 cm. Cystic ovarian
neoplasm cannot be excluded.

Musculoskeletal:  No suspicious bone lesions identified.
IMPRESSION: 1 cm subcapsular hemorrhagic cyst in the posterior right hepatic
lobe, which corresponds with the lesion seen on recent CT. No
evidence of hepatic neoplasm.

Moderate to severe hepatic steatosis.

Incompletely visualized cystic lesion in the upper right pelvis,
measuring at least 8 cm. Cystic ovarian neoplasm cannot be excluded.
Pelvis MRI without and with contrast is recommended for further
characterization.

## 2021-05-29 MED ORDER — GADOBUTROL 1 MMOL/ML IV SOLN
10.0000 mL | Freq: Once | INTRAVENOUS | Status: AC | PRN
  Administered 2021-05-29: 10 mL via INTRAVENOUS

## 2021-05-30 ENCOUNTER — Ambulatory Visit (INDEPENDENT_AMBULATORY_CARE_PROVIDER_SITE_OTHER): Payer: Medicaid Other | Admitting: Endocrinology

## 2021-05-30 ENCOUNTER — Other Ambulatory Visit: Payer: Self-pay

## 2021-05-30 DIAGNOSIS — E119 Type 2 diabetes mellitus without complications: Secondary | ICD-10-CM | POA: Insufficient documentation

## 2021-05-30 DIAGNOSIS — E038 Other specified hypothyroidism: Secondary | ICD-10-CM | POA: Diagnosis not present

## 2021-05-30 LAB — BASIC METABOLIC PANEL
BUN: 10 mg/dL (ref 6–23)
CO2: 28 mEq/L (ref 19–32)
Calcium: 9.5 mg/dL (ref 8.4–10.5)
Chloride: 100 mEq/L (ref 96–112)
Creatinine, Ser: 0.82 mg/dL (ref 0.40–1.20)
GFR: 86.49 mL/min (ref >60.00)
Glucose, Bld: 182 mg/dL — ABNORMAL HIGH (ref 70–99)
Potassium: 4.1 mEq/L (ref 3.5–5.1)
Sodium: 138 mEq/L (ref 135–145)

## 2021-05-30 LAB — TSH: TSH: 2.09 u[IU]/mL (ref 0.35–5.50)

## 2021-05-30 LAB — T4, FREE: Free T4: 0.77 ng/dL (ref 0.60–1.60)

## 2021-05-30 LAB — MAGNESIUM: Magnesium: 1.8 mg/dL (ref 1.5–2.5)

## 2021-05-30 LAB — PHOSPHORUS: Phosphorus: 4 mg/dL (ref 2.3–4.6)

## 2021-05-30 MED ORDER — ACARBOSE 50 MG PO TABS: 50.0000 mg | ORAL_TABLET | Freq: Three times a day (TID) | ORAL | 3 refills | Status: DC

## 2021-05-30 MED ORDER — ACCU-CHEK AVIVA PLUS W/DEVICE KIT: 1.0000 | PACK | Freq: Once | 0 refills | Status: AC

## 2021-05-30 MED ORDER — ACCU-CHEK AVIVA PLUS VI STRP: 1.0000 | ORAL_STRIP | Freq: Every day | 3 refills | Status: AC

## 2021-05-30 NOTE — Patient Instructions (Addendum)
I have sent a prescription to your pharmacy, to double the acarbose. Blood tests are requested for you today.  We'll let you know about the results.  Please come back for a follow-up appointment in January.

## 2021-05-30 NOTE — Progress Notes (Signed)
Subjective:    Patient ID: Heather Fry, female    DOB: 12-06-1975, 45 y.o.   MRN: [id number]  HPI Pt returns for f/u of postsurgical hypothyroidism (she had thyroidectomy 1/20, for hyperthyroidism; she was rx'ed synthroid soon thereafter).  She takes synthroid as rx'ed.   She also has mild postsurgical hypoparathyroidism (she takes vit-D, 10000 units/day).   She also has hypomagnesemia (she takes 250 mg qd).   She has chronic sxs: these have been normal: serum catechols, cortisol, and FSH/LH; she has had TAH but not BSO.    B-12 def: she stopped rx, due to high level.  Type 2 DM: she takes acarbose.  She has not recently used continuous glucose monitor.  Glucose varies from 121-277.  There is no trend throughout the day. She had a steroid shot last month, for migraine.   Past Medical History:  Diagnosis Date   Anxiety    Arthritis    Depression    Diverticulosis    GERD (gastroesophageal reflux disease)    Graves disease    Internal hemorrhoids    Thyroid disease    Uterine fibroid     Past Surgical History:  Procedure Laterality Date   ABDOMINAL HYSTERECTOMY     Per patient, uterine fibroids.   ABDOMINAL HYSTERECTOMY     CHOLECYSTECTOMY N/A 11/06/2014   Procedure: LAPAROSCOPIC CHOLECYSTECTOMY;  Surgeon: Jamesetta So, MD;  Location: [facility];  Service: General;  Laterality: N/A;   COLONOSCOPY N/A 03/04/2017   Dr. Gala Romney: Hemorrhoids, diverticulosis, benign polyp without adenomatous changes.  Next colonoscopy 10 years   DILATION AND CURETTAGE OF UTERUS     ESOPHAGOGASTRODUODENOSCOPY N/A 10/02/2014   Dr. Gala Romney: fundic gland polyps, hiatal hernia   ESOPHAGOGASTRODUODENOSCOPY  07/2020   [facility] ; normal esophagus biopsied, benign-appearing gastric polyps biopsied, gastric biopsies obtained to evaluate for H. pylori, normal duodenum.  Pathology with mild chronic gastritis, negative for H. pylori, fundic gland polyps, esophageal biopsy benign.   MOUTH SURGERY      extraction of teeth   POLYPECTOMY  03/04/2017   Procedure: POLYPECTOMY;  Surgeon: Daneil Dolin, MD;  Location: [facility];  Service: Endoscopy;;  colon   THYROIDECTOMY N/A 09/30/2018   Procedure: TOTAL THYROIDECTOMY;  Surgeon: Armandina Gemma, MD;  Location: [facility];  Service: General;  Laterality: N/A;   TUBAL LIGATION      Social History   Socioeconomic History   Marital status: Married    Spouse name: Not on file   Number of children: Not on file   Years of education: Not on file   Highest education level: Not on file  Occupational History   Not on file  Tobacco Use   Smoking status: Former    Packs/day: 1.00    Years: 5.00    Pack years: 5.00    Types: Cigarettes    Quit date: 11/03/1997    Years since quitting: 23.5   Smokeless tobacco: Never   Tobacco comments:    1 pack 1 week  Vaping Use   Vaping Use: Never used  Substance and Sexual Activity   Alcohol use: No    Alcohol/week: 0.0 standard drinks   Drug use: No   Sexual activity: Yes    Birth control/protection: Surgical  Other Topics Concern   Not on file  Social History Narrative   Not on file   Social Determinants of Health   Financial Resource Strain: Not on file  Food Insecurity: Not on file  Transportation  Needs: Not on file  Physical Activity: Not on file  Stress: Not on file  Social Connections: Not on file  Intimate Partner Violence: Not on file    Current Outpatient Medications on File Prior to Visit  Medication Sig Dispense Refill   acetaminophen (TYLENOL) 500 MG tablet Take 1,000 mg by mouth daily as needed for moderate pain.     albuterol (VENTOLIN HFA) 108 (90 Base) MCG/ACT inhaler Inhale 2 puffs into the lungs every 6 (six) hours as needed. (Patient taking differently: Inhale 2 puffs into the lungs every 6 (six) hours as needed for wheezing or shortness of breath.) 18 g 11   cetirizine (ZYRTEC) 10 MG tablet Take 10 mg by mouth daily.     Cholecalciferol (VITAMIN D3) 125 MCG (5000 UT)  CAPS Take 1 capsule (5,000 Units total) by mouth daily. (Patient taking differently: Take 10,000 Units by mouth daily.) 30 capsule 11   dicyclomine (BENTYL) 10 MG capsule Take 1 capsule (10 mg total) by mouth up to 3 times daily before meals and at bedtime for abdominal cramping or diarrhea.  Hold in the setting of constipation. 90 capsule 0   EPINEPHrine 0.3 mg/0.3 mL IJ SOAJ injection Inject 0.3 mg into the muscle as needed for anaphylaxis.     escitalopram (LEXAPRO) 20 MG tablet Take 1 tablet (20 mg total) by mouth at bedtime. 30 tablet 5   fluticasone (FLONASE) 50 MCG/ACT nasal spray Place 2 sprays into both nostrils at bedtime.     ipratropium (ATROVENT) 0.02 % nebulizer solution Take 0.5 mg by nebulization daily.     LIDOCAINE-MENTHOL ROLL-ON EX Apply 1 application topically daily as needed (pain).     Magnesium 250 MG TABS Take 250 mg by mouth at bedtime.      NON FORMULARY at bedtime. CPAP at [facility]     nystatin cream (MYCOSTATIN) Apply 1 application topically daily as needed (toe fungus).     ondansetron (ZOFRAN-ODT) 4 MG disintegrating tablet Take 4 mg by mouth every 8 (eight) hours as needed.     pantoprazole (PROTONIX) 40 MG tablet Take 1 tablet (40 mg total) by mouth 2 (two) times daily. 180 tablet 3   RESTASIS 0.05 % ophthalmic emulsion Place 1 drop into both eyes 2 (two) times daily.     Simethicone 250 MG CAPS Take 500 mg by mouth at bedtime.     sucralfate (CARAFATE) 1 g tablet Take 1 g by mouth daily.     SYNTHROID 150 MCG tablet Take 150 mcg by mouth daily.     terbinafine (LAMISIL) 250 MG tablet Take 250 mg by mouth daily.     No current facility-administered medications on file prior to visit.    Allergies  Allergen Reactions   Levofloxacin Other (See Comments)    Pt can not have due to taking Lexapro    Toradol [Ketorolac Tromethamine] Anaphylaxis and Hives   Tramadol Hives   Levomenol     Other reaction(s): Other   Other Other (See Comments)   Chamomile Nausea And  Vomiting    Lexapro   Hctz [Hydrochlorothiazide] Other (See Comments)    Dehydration   Lisinopril Nausea Only   Losartan Potassium Other (See Comments)    GI upset   Penicillins Other (See Comments)    Loopy, childhood allergy     Triamcinolone Acetonide     Elevated blood pressure, caused diabetes, changed tyroid electrolytes Patient has no tyroid      Family History  Problem Relation Age of  Onset   Uterine cancer Mother        ?cervical cancer   Hypertension Father    Diabetes Father    Uterine cancer Sister        suicide   Colon cancer Paternal Aunt 22   Depression Other    Thyroid disease Neg Hx    Pancreatic cancer Neg Hx     BP 140/84 (BP Location: Left Arm, Patient Position: Sitting, Cuff Size: Large)   Pulse 81   Ht '5\' 3"'$  (1.6 m)   Wt 265 lb (120.2 kg)   LMP 10/09/2014   SpO2 98%   BMI 46.94 kg/m    Review of Systems Denies diarrhea.      Objective:   Physical Exam Pulses: dorsalis pedis intact bilat.   MSK: no deformity of the feet CV: no leg edema Skin:  no ulcer on the feet.  normal color and temp on the feet. Neuro: sensation is intact to touch on the feet Ext: there is onychomycosis of the right foot toenails.    A1c=7.0%     Assessment & Plan:  Type 2 DM: uncontrolled Hypothyroidism: recheck today Low Mg++: recheck today  Patient Instructions  I have sent a prescription to your pharmacy, to double the acarbose. Blood tests are requested for you today.  We'll let you know about the results.  Please come back for a follow-up appointment in January.

## 2021-06-04 ENCOUNTER — Other Ambulatory Visit: Payer: Self-pay | Admitting: *Deleted

## 2021-06-04 DIAGNOSIS — R9389 Abnormal findings on diagnostic imaging of other specified body structures: Secondary | ICD-10-CM

## 2021-06-10 ENCOUNTER — Encounter (HOSPITAL_COMMUNITY): Payer: Self-pay | Admitting: *Deleted

## 2021-06-10 ENCOUNTER — Other Ambulatory Visit: Payer: Self-pay

## 2021-06-10 MED ORDER — VANCOMYCIN HCL 1500 MG/300ML IV SOLN
1500.0000 mg | INTRAVENOUS | Status: AC
  Administered 2021-06-11: 1500 mg via INTRAVENOUS
  Filled 2021-06-10: qty 300

## 2021-06-10 NOTE — Anesthesia Preprocedure Evaluation (Addendum)
Anesthesia Evaluation  Patient identified by MRN, date of birth, ID band Patient awake    Reviewed: Allergy & Precautions, NPO status , Patient's Chart, lab work & pertinent test results  Airway Mallampati: IV  TM Distance: >3 FB Neck ROM: Full    Dental  (+) Missing, Chipped,    Pulmonary asthma , sleep apnea and Continuous Positive Airway Pressure Ventilation , Patient abstained from smoking., former smoker,    Pulmonary exam normal breath sounds clear to auscultation       Cardiovascular negative cardio ROS Normal cardiovascular exam Rhythm:Regular Rate:Normal  ECG: NSR, rate 84   Neuro/Psych  Headaches, PSYCHIATRIC DISORDERS Anxiety Depression    GI/Hepatic Neg liver ROS, GERD  Medicated and Controlled,  Endo/Other  diabetes, Oral Hypoglycemic AgentsHypothyroidism   Renal/GU negative Renal ROS     Musculoskeletal  (+) Arthritis ,   Abdominal (+) + obese,   Peds  Hematology negative hematology ROS (+)   Anesthesia Other Findings Right carpal tunnel syndrome  Reproductive/Obstetrics hcg negative                            Anesthesia Physical Anesthesia Plan  ASA: 3  Anesthesia Plan: MAC   Post-op Pain Management:    Induction: Intravenous  PONV Risk Score and Plan: 2 and Ondansetron, Dexamethasone, Propofol infusion, Midazolam and Treatment may vary due to age or medical condition  Airway Management Planned: Simple Face Mask  Additional Equipment:   Intra-op Plan:   Post-operative Plan:   Informed Consent: I have reviewed the patients History and Physical, chart, labs and discussed the procedure including the risks, benefits and alternatives for the proposed anesthesia with the patient or authorized representative who has indicated his/her understanding and acceptance.     Dental advisory given  Plan Discussed with: CRNA  Anesthesia Plan Comments:         Anesthesia Quick Evaluation

## 2021-06-10 NOTE — Progress Notes (Signed)
Spoke with pt for pre-op call. Pt denies any cardiac history or HTN. Pt is a type 2 diabetic. Pt will hold her Precose morning of surgery. Last A1C about 2 weeks ago was 7.0. She states her fasting blood sugar I usually around 152. Pt does use a continuous blood sugar monitor.   Pt's surgery is scheduled as ambulatory so no Covid test is required prior to surgery.

## 2021-06-11 ENCOUNTER — Other Ambulatory Visit: Payer: Self-pay

## 2021-06-11 ENCOUNTER — Ambulatory Visit (HOSPITAL_COMMUNITY): Payer: Medicaid Other | Admitting: Certified Registered"

## 2021-06-11 ENCOUNTER — Ambulatory Visit (HOSPITAL_COMMUNITY)
Admission: RE | Admit: 2021-06-11 | Discharge: 2021-06-11 | Disposition: A | Discharge status: Home / Self Care | Payer: Medicaid Other | Attending: Neurosurgery | Admitting: Neurosurgery

## 2021-06-11 ENCOUNTER — Encounter (HOSPITAL_COMMUNITY): Payer: Self-pay

## 2021-06-11 ENCOUNTER — Encounter (HOSPITAL_COMMUNITY)
Admission: RE | Disposition: A | Discharge status: Home / Self Care | Payer: Self-pay | Source: Home / Self Care | Attending: Neurosurgery

## 2021-06-11 DIAGNOSIS — Z87892 Personal history of anaphylaxis: Secondary | ICD-10-CM | POA: Insufficient documentation

## 2021-06-11 DIAGNOSIS — Z79890 Hormone replacement therapy (postmenopausal): Secondary | ICD-10-CM | POA: Insufficient documentation

## 2021-06-11 DIAGNOSIS — G5601 Carpal tunnel syndrome, right upper limb: Secondary | ICD-10-CM | POA: Insufficient documentation

## 2021-06-11 DIAGNOSIS — E119 Type 2 diabetes mellitus without complications: Secondary | ICD-10-CM | POA: Insufficient documentation

## 2021-06-11 DIAGNOSIS — Z87891 Personal history of nicotine dependence: Secondary | ICD-10-CM | POA: Diagnosis not present

## 2021-06-11 DIAGNOSIS — Z885 Allergy status to narcotic agent status: Secondary | ICD-10-CM | POA: Insufficient documentation

## 2021-06-11 DIAGNOSIS — Z79899 Other long term (current) drug therapy: Secondary | ICD-10-CM | POA: Diagnosis not present

## 2021-06-11 DIAGNOSIS — Z880 Allergy status to penicillin: Secondary | ICD-10-CM | POA: Insufficient documentation

## 2021-06-11 DIAGNOSIS — Z888 Allergy status to other drugs, medicaments and biological substances status: Secondary | ICD-10-CM | POA: Diagnosis not present

## 2021-06-11 DIAGNOSIS — Z881 Allergy status to other antibiotic agents status: Secondary | ICD-10-CM | POA: Insufficient documentation

## 2021-06-11 DIAGNOSIS — E890 Postprocedural hypothyroidism: Secondary | ICD-10-CM | POA: Insufficient documentation

## 2021-06-11 HISTORY — DX: Sleep apnea, unspecified: G47.30

## 2021-06-11 HISTORY — DX: Type 2 diabetes mellitus without complications: E11.9

## 2021-06-11 HISTORY — DX: Headache, unspecified: R51.9

## 2021-06-11 HISTORY — DX: Pneumonia, unspecified organism: J18.9

## 2021-06-11 HISTORY — DX: Anemia, unspecified: D64.9

## 2021-06-11 HISTORY — DX: Unspecified asthma, uncomplicated: J45.909

## 2021-06-11 HISTORY — PX: CARPAL TUNNEL RELEASE: SHX101

## 2021-06-11 HISTORY — DX: Hypothyroidism, unspecified: E03.9

## 2021-06-11 HISTORY — DX: Fatty (change of) liver, not elsewhere classified: K76.0

## 2021-06-11 LAB — CBC
HCT: 38.5 % (ref 36.0–46.0)
Hemoglobin: 12.3 g/dL (ref 12.0–15.0)
MCH: 28 pg (ref 26.0–34.0)
MCHC: 31.9 g/dL (ref 30.0–36.0)
MCV: 87.5 fL (ref 80.0–100.0)
Platelets: 261 10*3/uL (ref 150–400)
RBC: 4.4 MIL/uL (ref 3.87–5.11)
RDW: 14 % (ref 11.5–15.5)
WBC: 10.4 10*3/uL (ref 4.0–10.5)
nRBC: 0 % (ref 0.0–0.2)

## 2021-06-11 LAB — GLUCOSE, CAPILLARY
Glucose-Capillary: 194 mg/dL — ABNORMAL HIGH (ref 70–99)
Glucose-Capillary: 125 mg/dL — ABNORMAL HIGH (ref 70–99)

## 2021-06-11 SURGERY — CARPAL TUNNEL RELEASE
Anesthesia: Monitor Anesthesia Care | Laterality: Right

## 2021-06-11 MED ORDER — CHLORHEXIDINE GLUCONATE CLOTH 2 % EX PADS: 6.0000 | MEDICATED_PAD | Freq: Once | CUTANEOUS | Status: DC

## 2021-06-11 MED ORDER — ONDANSETRON HCL 4 MG/2ML IJ SOLN
INTRAMUSCULAR | Status: AC
  Filled 2021-06-11: qty 2

## 2021-06-11 MED ORDER — CHLORHEXIDINE GLUCONATE 0.12 % MT SOLN
15.0000 mL | Freq: Once | OROMUCOSAL | Status: AC
  Administered 2021-06-11: 15 mL via OROMUCOSAL
  Filled 2021-06-11: qty 15

## 2021-06-11 MED ORDER — ACETAMINOPHEN 500 MG PO TABS
1000.0000 mg | ORAL_TABLET | Freq: Once | ORAL | Status: AC
  Administered 2021-06-11: 1000 mg via ORAL
  Filled 2021-06-11: qty 2

## 2021-06-11 MED ORDER — LIDOCAINE-EPINEPHRINE 2 %-1:100000 IJ SOLN
INTRAMUSCULAR | Status: AC
  Filled 2021-06-11: qty 1

## 2021-06-11 MED ORDER — DEXMEDETOMIDINE (PRECEDEX) IN NS 20 MCG/5ML (4 MCG/ML) IV SYRINGE
PREFILLED_SYRINGE | INTRAVENOUS | Status: AC
  Filled 2021-06-11: qty 5

## 2021-06-11 MED ORDER — FENTANYL CITRATE (PF) 250 MCG/5ML IJ SOLN
INTRAMUSCULAR | Status: AC
  Filled 2021-06-11: qty 5

## 2021-06-11 MED ORDER — PROPOFOL 10 MG/ML IV BOLUS
INTRAVENOUS | Status: DC | PRN
  Administered 2021-06-11 (×2): 20 mg via INTRAVENOUS
  Administered 2021-06-11: 10 mg via INTRAVENOUS
  Administered 2021-06-11 (×5): 20 mg via INTRAVENOUS
  Administered 2021-06-11: 30 mg via INTRAVENOUS
  Administered 2021-06-11 (×3): 20 mg via INTRAVENOUS

## 2021-06-11 MED ORDER — PROPOFOL 10 MG/ML IV BOLUS
INTRAVENOUS | Status: AC
  Filled 2021-06-11: qty 40

## 2021-06-11 MED ORDER — BUPIVACAINE HCL (PF) 0.5 % IJ SOLN
INTRAMUSCULAR | Status: AC
  Filled 2021-06-11: qty 30

## 2021-06-11 MED ORDER — MIDAZOLAM HCL 2 MG/2ML IJ SOLN
INTRAMUSCULAR | Status: AC
  Filled 2021-06-11: qty 2

## 2021-06-11 MED ORDER — LIDOCAINE-EPINEPHRINE 2 %-1:100000 IJ SOLN
INTRAMUSCULAR | Status: DC | PRN
  Administered 2021-06-11: 4 mL via INTRADERMAL

## 2021-06-11 MED ORDER — LACTATED RINGERS IV SOLN: INTRAVENOUS | Status: DC

## 2021-06-11 MED ORDER — DEXMEDETOMIDINE HCL IN NACL 200 MCG/50ML IV SOLN
INTRAVENOUS | Status: DC | PRN
  Administered 2021-06-11: 8 ug via INTRAVENOUS
  Administered 2021-06-11 (×3): 4 ug via INTRAVENOUS

## 2021-06-11 MED ORDER — ORAL CARE MOUTH RINSE: 15.0000 mL | Freq: Once | OROMUCOSAL | Status: AC

## 2021-06-11 MED ORDER — ONDANSETRON HCL 4 MG/2ML IJ SOLN
INTRAMUSCULAR | Status: DC | PRN
  Administered 2021-06-11: 4 mg via INTRAVENOUS

## 2021-06-11 MED ORDER — HYDROCODONE-ACETAMINOPHEN 5-325 MG PO TABS: 1.0000 | ORAL_TABLET | ORAL | 0 refills | Status: DC | PRN

## 2021-06-11 MED ORDER — BUPIVACAINE HCL (PF) 0.5 % IJ SOLN
INTRAMUSCULAR | Status: DC | PRN
  Administered 2021-06-11: 4 mL

## 2021-06-11 MED ORDER — 0.9 % SODIUM CHLORIDE (POUR BTL) OPTIME
TOPICAL | Status: DC | PRN
  Administered 2021-06-11: 1000 mL

## 2021-06-11 MED ORDER — MIDAZOLAM HCL 5 MG/5ML IJ SOLN
INTRAMUSCULAR | Status: DC | PRN
  Administered 2021-06-11: 2 mg via INTRAVENOUS

## 2021-06-11 MED ORDER — FENTANYL CITRATE (PF) 100 MCG/2ML IJ SOLN
INTRAMUSCULAR | Status: DC | PRN
  Administered 2021-06-11 (×2): 50 ug via INTRAVENOUS

## 2021-06-11 SURGICAL SUPPLY — 36 items
BAG COUNTER SPONGE SURGICOUNT (BAG) ×2 IMPLANT
BLADE SURG 15 STRL LF DISP TIS (BLADE) ×1 IMPLANT
BLADE SURG 15 STRL SS (BLADE) ×2
BNDG ELASTIC 3X5.8 VLCR STR LF (GAUZE/BANDAGES/DRESSINGS) ×2 IMPLANT
BNDG GAUZE ELAST 4 BULKY (GAUZE/BANDAGES/DRESSINGS) ×2 IMPLANT
BNDG STRETCH 4X75 STRL LF (GAUZE/BANDAGES/DRESSINGS) ×2 IMPLANT
CABLE BIPOLOR RESECTION CORD (MISCELLANEOUS) ×2 IMPLANT
DECANTER SPIKE VIAL GLASS SM (MISCELLANEOUS) ×2 IMPLANT
DRAPE EXTREMITY T 121X128X90 (DISPOSABLE) ×2 IMPLANT
DRAPE HALF SHEET 40X57 (DRAPES) ×2 IMPLANT
DRSG EMULSION OIL 3X3 NADH (GAUZE/BANDAGES/DRESSINGS) ×2 IMPLANT
GAUZE 4X4 16PLY ~~LOC~~+RFID DBL (SPONGE) ×2 IMPLANT
GAUZE SPONGE 4X4 12PLY STRL (GAUZE/BANDAGES/DRESSINGS) ×2 IMPLANT
GAUZE XEROFORM 1X8 LF (GAUZE/BANDAGES/DRESSINGS) ×2 IMPLANT
GLOVE EXAM NITRILE XL STR (GLOVE) IMPLANT
GLOVE SURG ENC MOIS LTX SZ8 (GLOVE) ×2 IMPLANT
GLOVE SURG UNDER POLY LF SZ8.5 (GLOVE) ×2 IMPLANT
GOWN STRL REUS W/ TWL LRG LVL3 (GOWN DISPOSABLE) ×1 IMPLANT
GOWN STRL REUS W/ TWL XL LVL3 (GOWN DISPOSABLE) IMPLANT
GOWN STRL REUS W/TWL 2XL LVL3 (GOWN DISPOSABLE) IMPLANT
GOWN STRL REUS W/TWL LRG LVL3 (GOWN DISPOSABLE) ×2
GOWN STRL REUS W/TWL XL LVL3 (GOWN DISPOSABLE)
KIT BASIN OR (CUSTOM PROCEDURE TRAY) ×2 IMPLANT
KIT TURNOVER KIT B (KITS) ×2 IMPLANT
NEEDLE HYPO 25X1 1.5 SAFETY (NEEDLE) ×2 IMPLANT
NS IRRIG 1000ML POUR BTL (IV SOLUTION) ×2 IMPLANT
PACK SURGICAL SETUP 50X90 (CUSTOM PROCEDURE TRAY) ×2 IMPLANT
PAD ARMBOARD 7.5X6 YLW CONV (MISCELLANEOUS) ×6 IMPLANT
STOCKINETTE 4X48 STRL (DRAPES) ×2 IMPLANT
SUT ETHILON 3 0 PS 1 (SUTURE) ×4 IMPLANT
SYR BULB EAR ULCER 3OZ GRN STR (SYRINGE) ×2 IMPLANT
SYR CONTROL 10ML LL (SYRINGE) ×2 IMPLANT
TOWEL GREEN STERILE (TOWEL DISPOSABLE) ×2 IMPLANT
TOWEL GREEN STERILE FF (TOWEL DISPOSABLE) ×2 IMPLANT
UNDERPAD 30X36 HEAVY ABSORB (UNDERPADS AND DIAPERS) ×2 IMPLANT
WATER STERILE IRR 1000ML POUR (IV SOLUTION) ×2 IMPLANT

## 2021-06-11 NOTE — Op Note (Signed)
Procedure(s): Right Carpel tunnel release Procedure Note  Heather Fry female 45 y.o. 06/11/2021  Procedure(s) and Anesthesia Type:    * Right Carpel tunnel release - Monitor Anesthesia Care  Surgeon(s) and Role:    Marcello Moores, Dorcas Carrow, MD - Primary   Indications: This is a 45 year old woman with history of Graves' disease status post thyroidectomy with persistent hypothyroid symptoms despite supplementation who developed progressive right carpal tunnel syndrome.  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  Nighttime wrist splints and Occupational Therapy failed to improve her symptoms.  As she was losing function in her hand, the option of carpal tunnel release was discussed with the patient.  Given her clinical hypothyroidism, I discussed with the patient that she may not derive as much benefit from this procedure as we would like but it would potentially help significantly.  Risk, benefits, alternatives, and expected convalescence were discussed with her.  Risk discussed included but were not limited to, bleeding, pain, infection, weakness, numbness, neuroma, and injury to nearby structures.  Informed consent was obtained and she wished to proceed.     Surgeon: Vallarie Mare   Assistants: None  Anesthesia: Monitored Local Anesthesia with Sedation   Procedure Detail  Right Carpel tunnel release  The patient is brought to the operating room.  She was placed supine on the table with her right arm outstretched on arm board.  Sedative medication was administered.  Her right hand and wrist were cleaned and anesthetized with lidocaine with epinephrine mixed with Marcaine.  The arm and hand were prepped and draped in sterile fashion.  A timeout was performed.  Incision was made with a 15 blade from Kaplan's cardinal line distally down to half a centimeter from the wrist crease in line with the ring finger.  Subcutaneous fat and palmar aponeurosis was  incised.  The transverse carpal ligament was identified.  Self-retaining retractor was placed.  15 blade was used to carefully open the transverse carpal ligament.  Upon opening it, the severely edematous median nerve was identified with significant release of pressure.  The nerve was protected with a Valora Corporal and the remainder of the transverse carpal ligament was incised distally and proximally with 15 blade.  Freer elevator confirmed no significant remaining distal ligament was present.  Attention was then turned more proximally.  The Valora Corporal was used to protect the carpal tunnel contents and using tenotomy scissors, the proximal portion of the transverse carpal ligament and flexor retinaculum were cut.  Valora Corporal was then able to be easily passed into the forearm without resistance.  With the carpal tunnel well decompressed, meticulous hemostasis was obtained.  The wound was irrigated thoroughly.  Skin was closed with 3-0 nylon with vertical mattress stitches.  Xeroform gauze followed by gauze, Kerlix and Ace bandage was placed over her hand.  The arm was then elevated.  All counts were correct at the end of surgery.  No complications were noted.    Findings: Severely edematous median nerve, successful carpal tunnel release  Estimated Blood Loss:  Minimal         Drains: None         Total IV Fluids: See anesthesia records  Blood Given: none          Specimens: None         Implants: None        Complications:  * No complications entered in OR log *  Disposition: PACU - hemodynamically stable.         Condition: stable

## 2021-06-11 NOTE — H&P (Signed)
CC: right carpal tunnel syndrome  HPI:     Patient is a 45 y.o. female with DM, hx of thyroidectomy presented with worsening right carpal tunnel syndrome, despite nonsurgical treatments including OT and wrist splints.   Patient Active Problem List   Diagnosis Date Noted   Diabetes ([place]) 05/30/2021   Irritable bowel syndrome 04/17/2021   Weight gain 12/20/2020   Diarrhea 05/08/2020   Idiopathic peripheral neuropathy 04/18/2020   Myelopathy ([place]) 04/18/2020   OSA on CPAP 04/18/2020   RLS (restless legs syndrome) 04/18/2020   Sensory disturbance 01/20/2020   Atypical chest pain 01/20/2020   Obesity, Class III, BMI 40-49.9 (morbid obesity) ([place]) 01/20/2020   Weakness 01/19/2020   Hyperglycemia 10/04/2019   Myalgia 09/05/2019   Vitamin B 12 deficiency 07/28/2019   Right sided numbness 06/29/2019   Constipation 06/15/2019   Elevated lipase 06/15/2019   Hypomagnesemia 03/29/2019   Leukocytosis 03/29/2019   Pain of upper abdomen 02/10/2019   Nausea without vomiting 02/10/2019   Tingling of face 11/30/2018   Hot flashes 11/30/2018   Vitamin D deficiency 10/06/2018   GERD (gastroesophageal reflux disease) 01/28/2017   Rectal bleeding 01/28/2017   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   Hypothyroidism 04/14/2016   Gastric polyp    RUQ abdominal pain 09/06/2014   Excessive or frequent menstruation 01/13/2013   Depression    Past Medical History:  Diagnosis Date   Allergic asthma    Anemia    Anxiety    Arthritis    Depression    Diabetes mellitus without complication ([place])    Diverticulosis    Fatty liver    moderate to severe   GERD (gastroesophageal reflux disease)    Graves disease    Headache    tension, ocular migraines   Hypothyroidism    Internal hemorrhoids    Pneumonia    as an infant   Sleep apnea    Thyroid disease    Uterine fibroid     Past Surgical History:  Procedure Laterality Date   ABDOMINAL HYSTERECTOMY     Per patient, uterine fibroids.    ABDOMINAL HYSTERECTOMY     CHOLECYSTECTOMY N/A 11/06/2014   Procedure: LAPAROSCOPIC CHOLECYSTECTOMY;  Surgeon: Jamesetta So, MD;  Location: [facility];  Service: General;  Laterality: N/A;   COLONOSCOPY N/A 03/04/2017   Dr. Gala Romney: Hemorrhoids, diverticulosis, benign polyp without adenomatous changes.  Next colonoscopy 10 years   DILATION AND CURETTAGE OF UTERUS     ESOPHAGOGASTRODUODENOSCOPY N/A 10/02/2014   Dr. Gala Romney: fundic gland polyps, hiatal hernia   ESOPHAGOGASTRODUODENOSCOPY  07/2020   [facility] ; normal esophagus biopsied, benign-appearing gastric polyps biopsied, gastric biopsies obtained to evaluate for H. pylori, normal duodenum.  Pathology with mild chronic gastritis, negative for H. pylori, fundic gland polyps, esophageal biopsy benign.   MOUTH SURGERY     extraction of teeth   POLYPECTOMY  03/04/2017   Procedure: POLYPECTOMY;  Surgeon: Daneil Dolin, MD;  Location: [facility];  Service: Endoscopy;;  colon   THYROIDECTOMY N/A 09/30/2018   Procedure: TOTAL THYROIDECTOMY;  Surgeon: Armandina Gemma, MD;  Location: [facility];  Service: General;  Laterality: N/A;   TUBAL LIGATION      Medications Prior to Admission  Medication Sig Dispense Refill Last Dose   acetaminophen (TYLENOL) 500 MG tablet Take 1,000 mg by mouth daily as needed for moderate pain.   06/10/2021   cetirizine (ZYRTEC) 10 MG tablet Take 10 mg by mouth daily.   06/10/2021   Cholecalciferol (VITAMIN D3)  125 MCG (5000 UT) CAPS Take 1 capsule (5,000 Units total) by mouth daily. (Patient taking differently: Take 10,000 Units by mouth daily.) 30 capsule 11 06/09/2021   dicyclomine (BENTYL) 10 MG capsule Take 1 capsule (10 mg total) by mouth up to 3 times daily before meals and at bedtime for abdominal cramping or diarrhea.  Hold in the setting of constipation. 90 capsule 0 06/09/2021   escitalopram (LEXAPRO) 20 MG tablet Take 1 tablet (20 mg total) by mouth at bedtime. 30 tablet 5 06/10/2021   fluticasone (FLONASE) 50  MCG/ACT nasal spray Place 2 sprays into both nostrils at bedtime.   06/10/2021   LIDOCAINE-MENTHOL ROLL-ON EX Apply 1 application topically daily as needed (pain).   Past Week   Magnesium 250 MG TABS Take 250 mg by mouth at bedtime.    06/09/2021   NON FORMULARY at bedtime. CPAP at night   06/10/2021   nystatin cream (MYCOSTATIN) Apply 1 application topically daily as needed (toe fungus).   06/09/2021   ondansetron (ZOFRAN-ODT) 4 MG disintegrating tablet Take 4 mg by mouth every 8 (eight) hours as needed.   06/09/2021   pantoprazole (PROTONIX) 40 MG tablet Take 1 tablet (40 mg total) by mouth 2 (two) times daily. 180 tablet 3 06/10/2021   RESTASIS 0.05 % ophthalmic emulsion Place 1 drop into both eyes 2 (two) times daily.   06/10/2021   Simethicone 250 MG CAPS Take 500 mg by mouth at bedtime.   06/10/2021   sucralfate (CARAFATE) 1 g tablet Take 1 g by mouth daily.   Past Week   SYNTHROID 150 MCG tablet Take 150 mcg by mouth daily.   06/11/2021 at 0430   acarbose (PRECOSE) 50 MG tablet Take 1 tablet (50 mg total) by mouth 3 (three) times daily with meals. 270 tablet 3 06/09/2021   albuterol (VENTOLIN HFA) 108 (90 Base) MCG/ACT inhaler Inhale 2 puffs into the lungs every 6 (six) hours as needed. (Patient taking differently: Inhale 2 puffs into the lungs every 6 (six) hours as needed for wheezing or shortness of breath.) 18 g 11 More than a month   EPINEPHrine 0.3 mg/0.3 mL IJ SOAJ injection Inject 0.3 mg into the muscle as needed for anaphylaxis.   Unknown   glucose blood (ACCU-CHEK AVIVA PLUS) test strip 1 each by Other route daily. And lancets 1/day 100 each 3    ipratropium (ATROVENT) 0.02 % nebulizer solution Take 0.5 mg by nebulization daily.   More than a month   terbinafine (LAMISIL) 250 MG tablet Take 250 mg by mouth daily.   Unknown   Allergies  Allergen Reactions   Levofloxacin Other (See Comments)    Pt can not have due to taking Lexapro    Toradol [Ketorolac Tromethamine] Anaphylaxis and Hives    Tramadol Hives   Levomenol     Other reaction(s): Other   Other Other (See Comments)   Chamomile Nausea And Vomiting    Lexapro   Hctz [Hydrochlorothiazide] Other (See Comments)    Dehydration   Lisinopril Nausea Only   Losartan Potassium Other (See Comments)    GI upset   Penicillins Other (See Comments)    Loopy, childhood allergy     Triamcinolone Acetonide     Elevated blood pressure, caused diabetes, changed tyroid electrolytes Patient has no tyroid      Social History   Tobacco Use   Smoking status: Former    Packs/day: 1.00    Years: 5.00    Pack years: 5.00  Types: Cigarettes    Quit date: 11/03/1997    Years since quitting: 23.6   Smokeless tobacco: Never   Tobacco comments:    1 pack 1 week  Substance Use Topics   Alcohol use: No    Alcohol/week: 0.0 standard drinks    Family History  Problem Relation Age of Onset   Uterine cancer Mother        ?cervical cancer   Hypertension Father    Diabetes Father    Uterine cancer Sister        suicide   Colon cancer Paternal Aunt 81   Depression Other    Thyroid disease Neg Hx    Pancreatic cancer Neg Hx      Review of Systems Pertinent items are noted in HPI.  Objective:   Patient Vitals for the past 8 hrs:  BP Temp Temp src Pulse Resp SpO2 Height Weight  06/11/21 0646 (!) 159/85 -- -- 80 18 100 % -- --  06/11/21 0623 (!) 164/90 97.8 F (36.6 C) Oral 85 18 98 % '5\' 4"'$  (1.626 m) 121.6 kg   No intake/output data recorded. No intake/output data recorded.    NAD + Tinel's, Phalen's, 4/5 APB on right.  Otw 5/5  Data ReviewCBC:  Lab Results  Component Value Date   WBC 9.7 05/10/2021   RBC 4.45 05/10/2021   BMP:  Lab Results  Component Value Date   GLUCOSE 182 (H) 05/30/2021   CO2 28 05/30/2021   BUN 10 05/30/2021   CREATININE 0.82 05/30/2021   CREATININE 0.68 05/08/2020   CALCIUM 9.5 05/30/2021    Assessment:   Active Problems:   * No active hospital problems. *   Plan:   R  carpal tunnel syndrome - release today

## 2021-06-11 NOTE — Discharge Instructions (Addendum)
Keep right arm elevated as much as possible.  Remove dressing on 06/14/21, and then redress with gauze.  Return to clinic in 10 days for stitch removal.

## 2021-06-11 NOTE — Transfer of Care (Signed)
Immediate Anesthesia Transfer of Care Note  Patient: Heather Fry  Procedure(s) Performed: Right Carpel tunnel release (Right)  Patient Location: PACU  Anesthesia Type:MAC  Level of Consciousness: awake, alert  and oriented  Airway & Oxygen Therapy: Patient Spontanous Breathing  Post-op Assessment: Report given to RN and Post -op Vital signs reviewed and stable  Post vital signs: Reviewed and stable  Last Vitals:  Vitals Value Taken Time  BP 121/80 06/11/21 0858  Temp 36.7 C 06/11/21 0857  Pulse 86 06/11/21 0901  Resp 14 06/11/21 0901  SpO2 99 % 06/11/21 0901  Vitals shown include unvalidated device data.  Last Pain:  Vitals:   06/11/21 0857  TempSrc:   PainSc: 0-No pain         Complications: No notable events documented.

## 2021-06-11 NOTE — Anesthesia Postprocedure Evaluation (Signed)
Anesthesia Post Note  Patient: Heather Fry  Procedure(s) Performed: Right Carpel tunnel release (Right)     Patient location during evaluation: PACU Anesthesia Type: MAC Level of consciousness: awake Pain management: pain level controlled Vital Signs Assessment: post-procedure vital signs reviewed and stable Respiratory status: spontaneous breathing, nonlabored ventilation, respiratory function stable and patient connected to nasal cannula oxygen Cardiovascular status: stable and blood pressure returned to baseline Postop Assessment: no apparent nausea or vomiting Anesthetic complications: no   No notable events documented.  Last Vitals:  Vitals:   06/11/21 0646 06/11/21 0857  BP: (!) 159/85   Pulse: 80 86  Resp: 18 20  Temp:  36.7 C  SpO2: 100% 97%    Last Pain:  Vitals:   06/11/21 0857  TempSrc:   PainSc: 0-No pain                 Lyrika Souders P Greenlee Ancheta

## 2021-06-12 ENCOUNTER — Ambulatory Visit (HOSPITAL_COMMUNITY): Payer: Medicaid Other

## 2021-06-12 ENCOUNTER — Encounter (HOSPITAL_COMMUNITY): Payer: Self-pay | Admitting: Neurosurgery

## 2021-06-21 ENCOUNTER — Ambulatory Visit (HOSPITAL_COMMUNITY): Admission: RE | Admit: 2021-06-21 | Payer: Medicaid Other | Source: Ambulatory Visit

## 2021-06-22 ENCOUNTER — Other Ambulatory Visit: Payer: Self-pay

## 2021-06-22 ENCOUNTER — Emergency Department (HOSPITAL_COMMUNITY): Payer: Medicaid Other

## 2021-06-22 ENCOUNTER — Encounter (HOSPITAL_COMMUNITY): Payer: Self-pay | Admitting: Emergency Medicine

## 2021-06-22 ENCOUNTER — Emergency Department (HOSPITAL_COMMUNITY)
Admission: EM | Admit: 2021-06-22 | Discharge: 2021-06-22 | Disposition: A | Discharge status: Home / Self Care | Payer: Medicaid Other | Attending: Emergency Medicine | Admitting: Emergency Medicine

## 2021-06-22 DIAGNOSIS — E039 Hypothyroidism, unspecified: Secondary | ICD-10-CM | POA: Diagnosis not present

## 2021-06-22 DIAGNOSIS — E119 Type 2 diabetes mellitus without complications: Secondary | ICD-10-CM | POA: Diagnosis not present

## 2021-06-22 DIAGNOSIS — J45901 Unspecified asthma with (acute) exacerbation: Secondary | ICD-10-CM

## 2021-06-22 DIAGNOSIS — Z87891 Personal history of nicotine dependence: Secondary | ICD-10-CM | POA: Insufficient documentation

## 2021-06-22 DIAGNOSIS — R0602 Shortness of breath: Secondary | ICD-10-CM | POA: Diagnosis present

## 2021-06-22 LAB — CBC
HCT: 38.5 % (ref 36.0–46.0)
Hemoglobin: 12.6 g/dL (ref 12.0–15.0)
MCH: 29 pg (ref 26.0–34.0)
MCHC: 32.7 g/dL (ref 30.0–36.0)
MCV: 88.5 fL (ref 80.0–100.0)
Platelets: 244 10*3/uL (ref 150–400)
RBC: 4.35 MIL/uL (ref 3.87–5.11)
RDW: 14.6 % (ref 11.5–15.5)
WBC: 8.3 10*3/uL (ref 4.0–10.5)
nRBC: 0 % (ref 0.0–0.2)

## 2021-06-22 LAB — COMPREHENSIVE METABOLIC PANEL
ALT: 31 U/L (ref 0–44)
AST: 29 U/L (ref 15–41)
Albumin: 3.8 g/dL (ref 3.5–5.0)
Alkaline Phosphatase: 55 U/L (ref 38–126)
Anion gap: 9 (ref 5–15)
BUN: 9 mg/dL (ref 6–20)
CO2: 22 mmol/L (ref 22–32)
Calcium: 8.5 mg/dL — ABNORMAL LOW (ref 8.9–10.3)
Chloride: 104 mmol/L (ref 98–111)
Creatinine, Ser: 0.62 mg/dL (ref 0.44–1.00)
GFR, Estimated: >60 mL/min (ref >60)
Glucose, Bld: 172 mg/dL — ABNORMAL HIGH (ref 70–99)
Potassium: 3.5 mmol/L (ref 3.5–5.1)
Sodium: 135 mmol/L (ref 135–145)
Total Bilirubin: 0.1 mg/dL — ABNORMAL LOW (ref 0.3–1.2)
Total Protein: 7.5 g/dL (ref 6.5–8.1)

## 2021-06-22 LAB — TROPONIN I (HIGH SENSITIVITY)
Troponin I (High Sensitivity): 4 ng/L (ref <18)
Troponin I (High Sensitivity): 4 ng/L (ref <18)

## 2021-06-22 IMAGING — CT CT ANGIO CHEST
2 of 6 series · 18 of 46 positions shown · IV contrast (Omnipaque or Isovue)
Comparison: Portable chest [8Z] hours today.  CTA chest [DATE].

CLINICAL DATA: 45-year-old female with shortness of breath since
yesterday. Home breathing treatments without improvement.

EXAM:
CT ANGIOGRAPHY CHEST WITH CONTRAST
TECHNIQUE: Multidetector CT imaging of the chest was performed using the
standard protocol during bolus administration of intravenous
contrast. Multiplanar CT image reconstructions and MIPs were
obtained to evaluate the vascular anatomy.
CONTRAST:  100mL OMNIPAQUE IOHEXOL 350 MG/ML SOLN

[Series 5: pe axial thins · axial · 0.66mm/px · z∈[+1274,+1514]mm · 15 of 327 slices shown]
[im 14/327  lung]
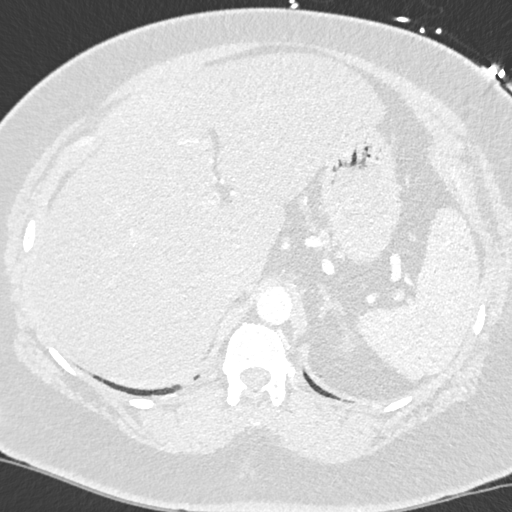
[im 41/327  soft-tissue]
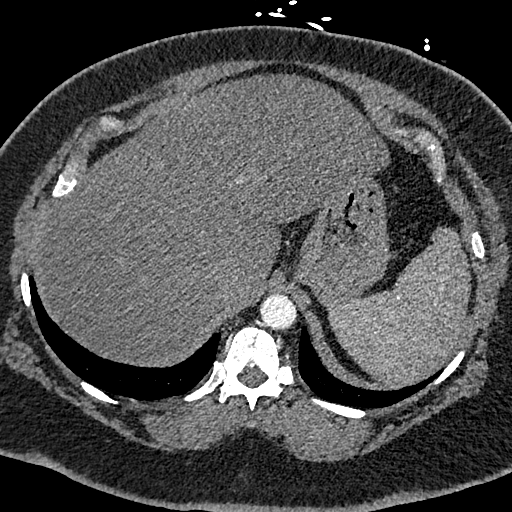
[im 55/327  lung]
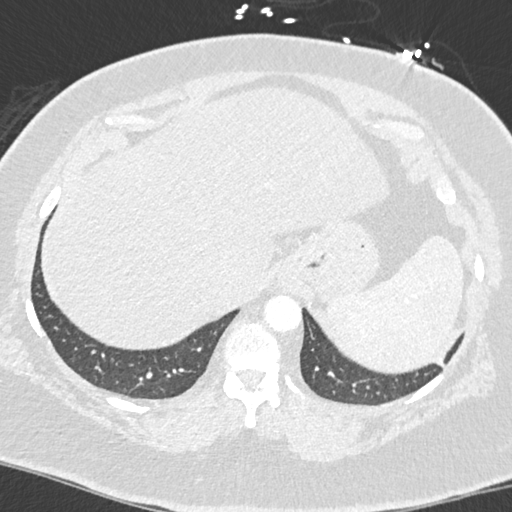
[im 82/327  soft-tissue]
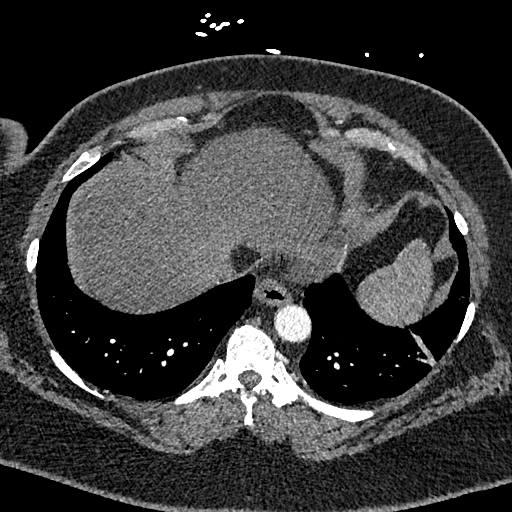
[im 96/327  lung]
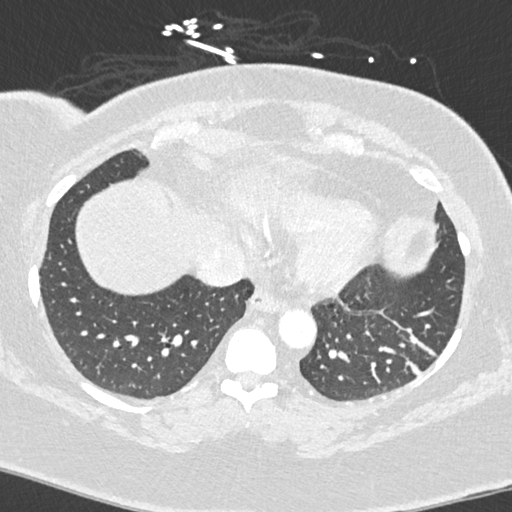
[im 123/327  soft-tissue]
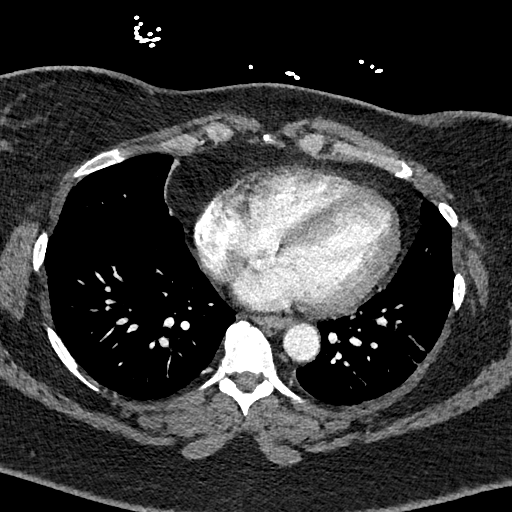
[im 136/327  lung]
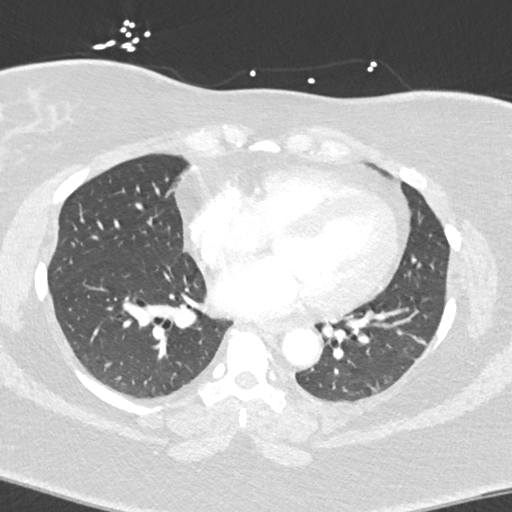
[im 164/327  soft-tissue]
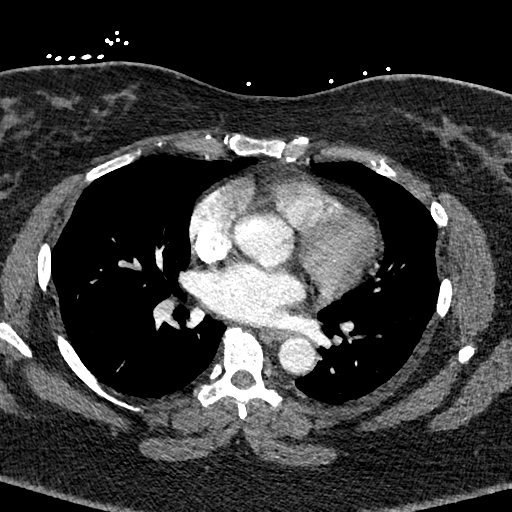
[im 191/327  lung]
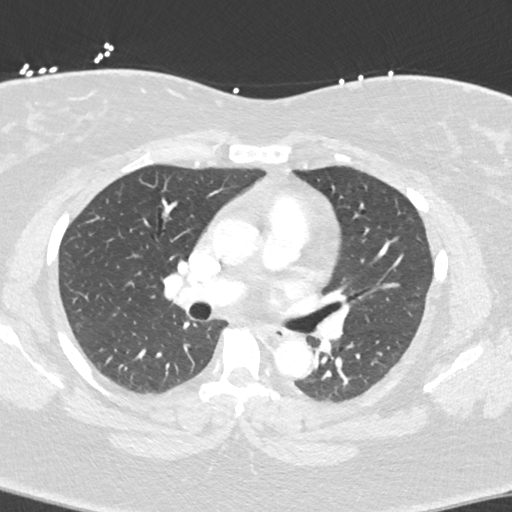
[im 204/327  soft-tissue]
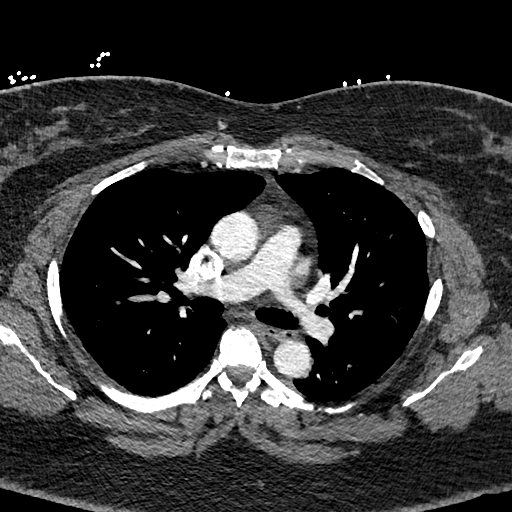
[im 231/327  lung]
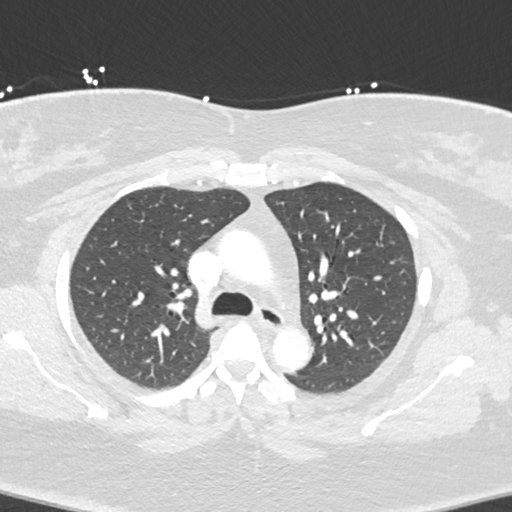
[im 245/327  soft-tissue]
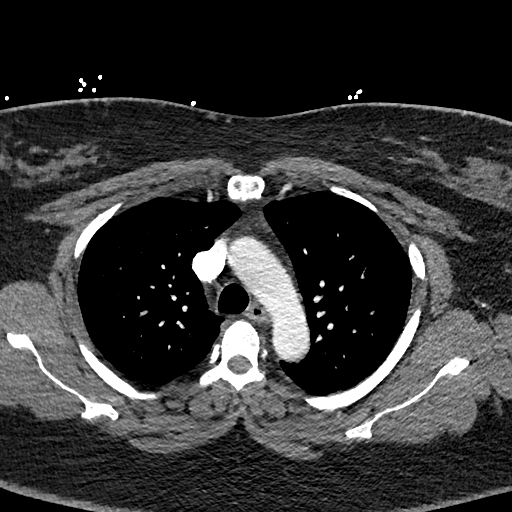
[im 272/327  lung]
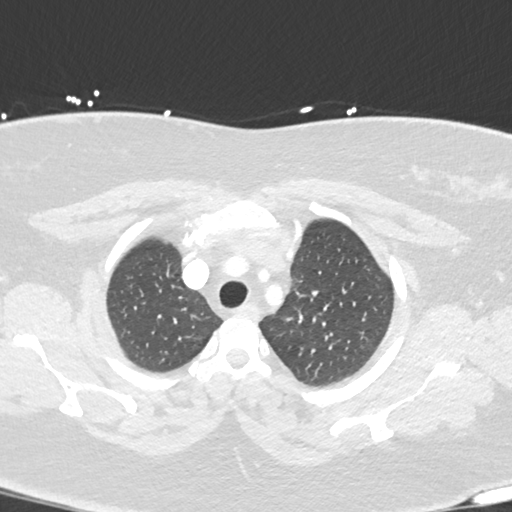
[im 286/327  soft-tissue]
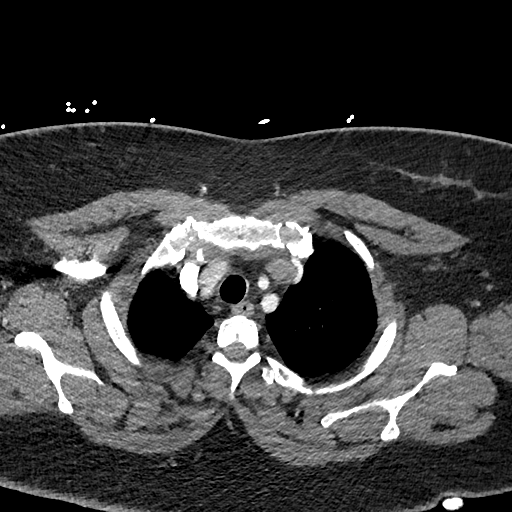
[im 313/327  lung]
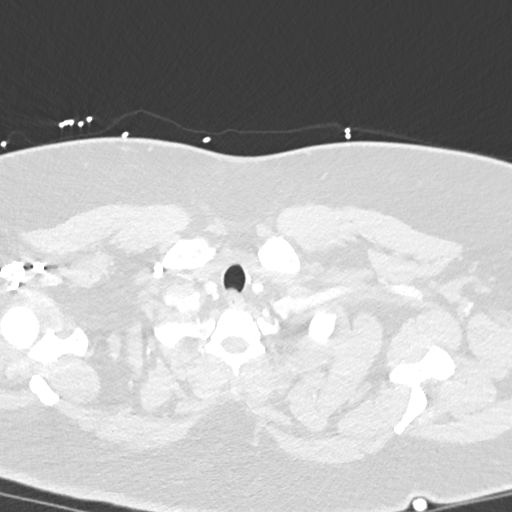

[Series 8: cor soft · coronal · 0.53mm/px · 3 of 128 slices shown]
[im 32/128  soft-tissue]
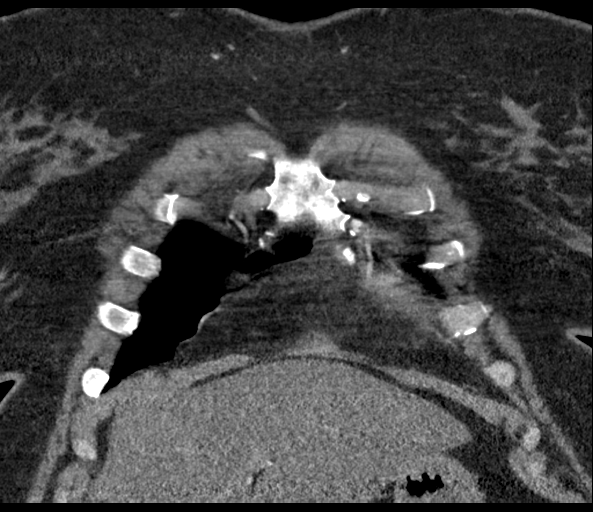
[im 64/128  soft-tissue]
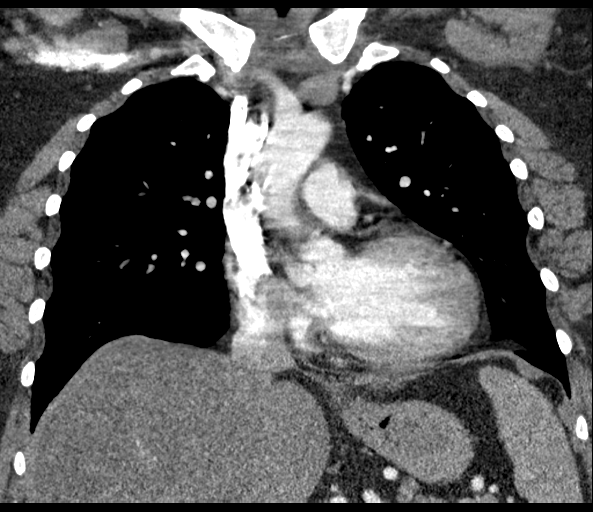
[im 96/128  soft-tissue]
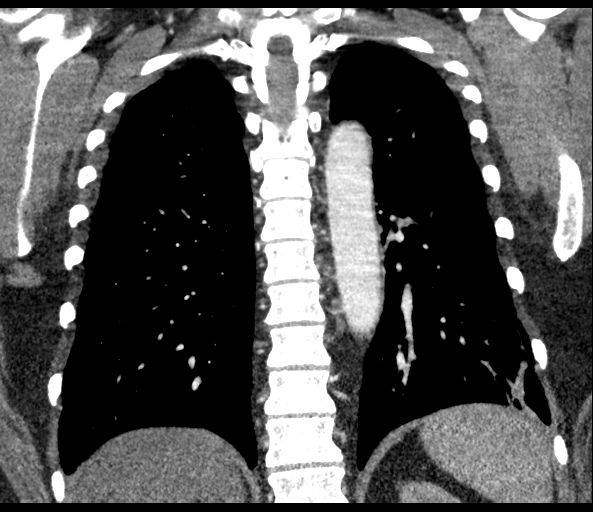

[18 of 46 positions shown; findings below may reference images not displayed]

FINDINGS: Cardiovascular: Adequate contrast bolus timing in the pulmonary
arterial tree. Mild respiratory motion.

No focal filling defect identified in the pulmonary arteries to
suggest acute pulmonary embolism.

Cardiac size is stable and within normal limits. Negative visible
aorta.

Mediastinum/Nodes: Prior thyroidectomy. Mediastinum is negative with
no lymphadenopathy.

Lungs/Pleura: Mildly improved lung volumes compared to [8Z]. Linear
atelectasis or scarring in the left lower lobe is unchanged. But
elsewhere the lungs are essentially clear. No pleural effusion.

Upper Abdomen: Chronic hepatic steatosis and probable hepatomegaly.
Negative visible spleen, stomach, pancreas, left adrenal gland and
kidney in the upper abdomen.

Musculoskeletal: No acute osseous abnormality identified.

Review of the MIP images confirms the above findings.
IMPRESSION: 1. Negative for acute pulmonary embolus.
2. No acute finding in the chest.
3. Chronic fatty liver disease with probable hepatomegaly.

## 2021-06-22 IMAGING — DX DG CHEST 1V PORT
1 series · 1 of 1 positions shown · non-contrast
Comparison: [DATE]

CLINICAL DATA: Dyspnea

EXAM:
PORTABLE CHEST 1 VIEW

[chest ap grid]
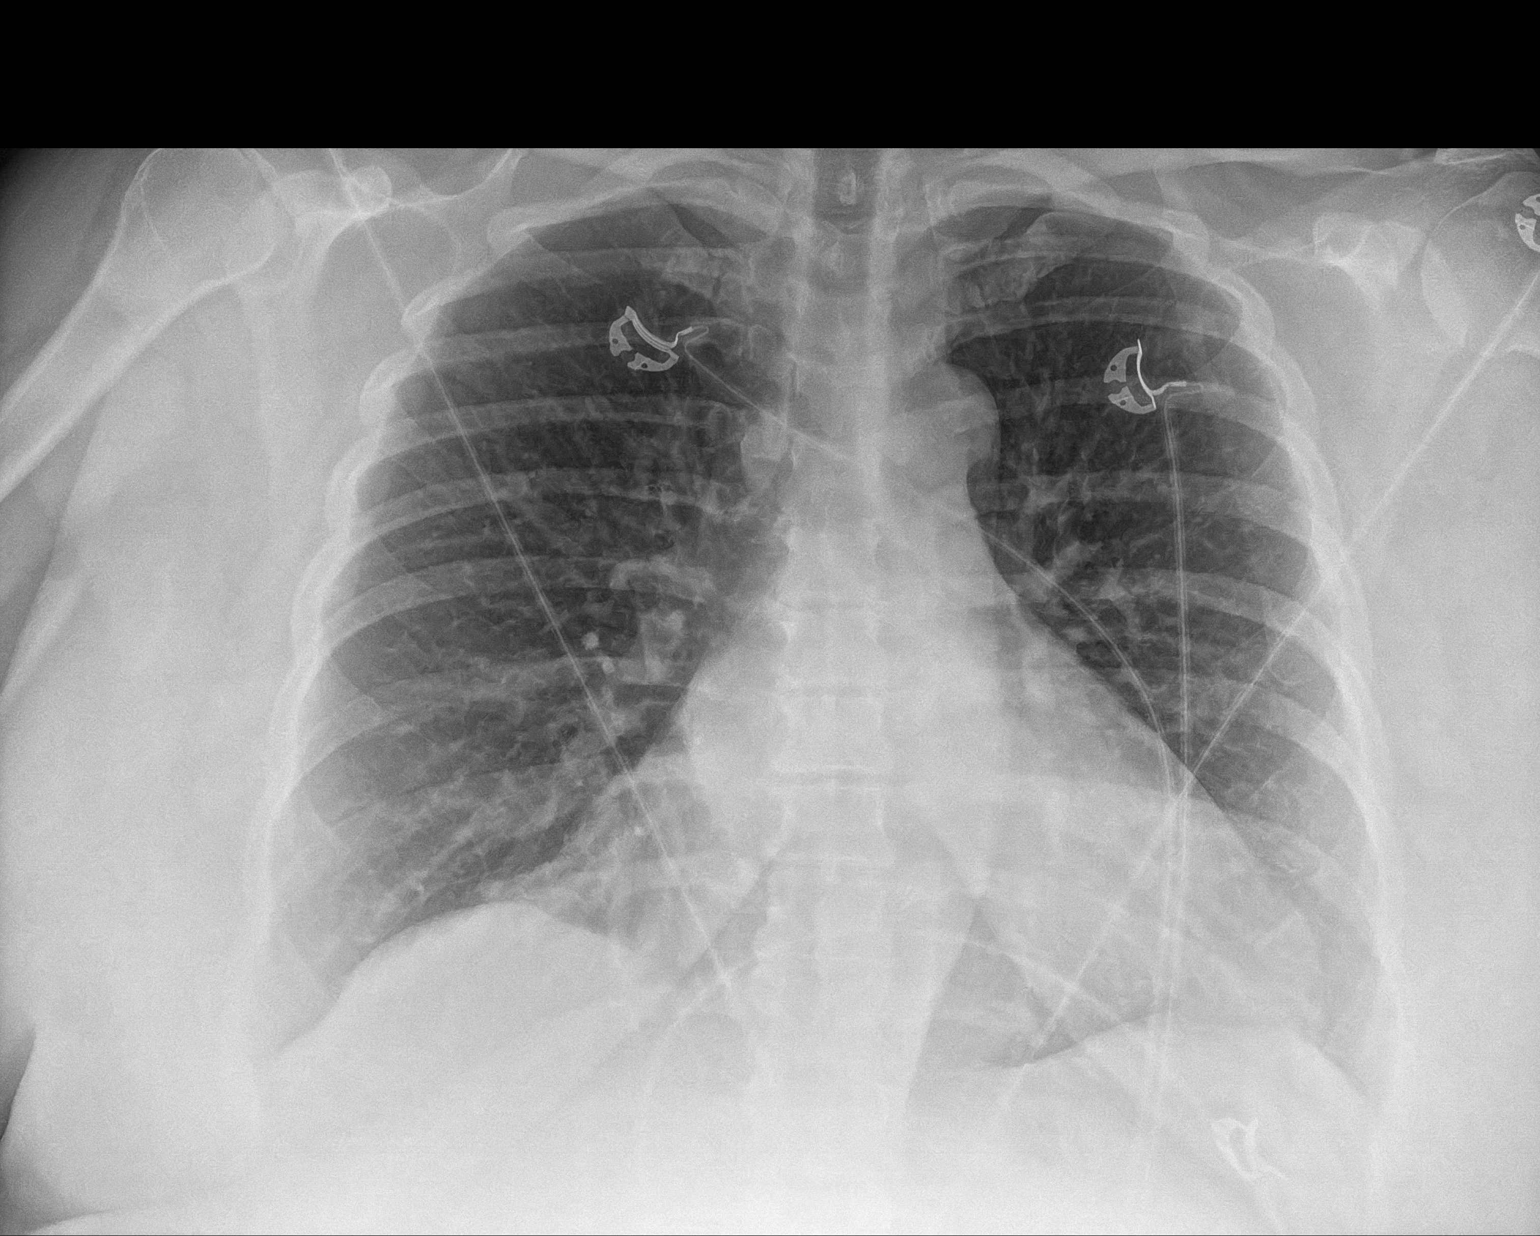

[1 of 1 positions shown; findings below may reference images not displayed]

FINDINGS: The heart size and mediastinal contours are within normal limits.
Both lungs are clear. The visualized skeletal structures are
unremarkable.
IMPRESSION: No active disease.

## 2021-06-22 MED ORDER — ALBUTEROL SULFATE (2.5 MG/3ML) 0.083% IN NEBU
INHALATION_SOLUTION | RESPIRATORY_TRACT | Status: AC
  Administered 2021-06-22: 5 mg
  Filled 2021-06-22: qty 6

## 2021-06-22 MED ORDER — IPRATROPIUM BROMIDE 0.02 % IN SOLN
RESPIRATORY_TRACT | Status: AC
  Administered 2021-06-22: 0.5 mg
  Filled 2021-06-22: qty 2.5

## 2021-06-22 MED ORDER — ALBUTEROL SULFATE HFA 108 (90 BASE) MCG/ACT IN AERS
2.0000 | INHALATION_SPRAY | RESPIRATORY_TRACT | Status: DC | PRN
  Administered 2021-06-22: 2 via RESPIRATORY_TRACT
  Filled 2021-06-22: qty 6.7

## 2021-06-22 MED ORDER — IOHEXOL 350 MG/ML SOLN
100.0000 mL | Freq: Once | INTRAVENOUS | Status: AC | PRN
  Administered 2021-06-22: 100 mL via INTRAVENOUS

## 2021-06-22 NOTE — Discharge Instructions (Addendum)
You were evaluated in the Emergency Department and after careful evaluation, we did not find any emergent condition requiring admission or further testing in the hospital.  Your exam/testing today was overall reassuring.  Symptoms seem to be due to asthma exacerbation.  CT scan did not show any emergencies or blood clots.  Recommend close follow-up with your primary care doctor to discuss her symptoms.  Please return to the Emergency Department if you experience any worsening of your condition.  Thank you for allowing [place] to be a part of your care.

## 2021-06-22 NOTE — ED Provider Notes (Signed)
[facility] Emergency Department Provider Note MRN:  [id number]  Arrival date & time: 06/22/21     Chief Complaint   Shortness of Breath   History of Present Illness   Heather Fry is a 45 y.o. year-old female with a history of diabetes presenting to the ED with chief complaint of shortness of breath.  Shortness of breath all day today with some chest tightness.  Feels somewhat similar to asthma exacerbation.  Denies abdominal pain, no recent fever or cough.  Had surgery on her hand several days ago.  Did not improve at home with breathing treatment.  No other exacerbating or alleviating factors, symptoms constant, mild to moderate.  Review of Systems  A complete 10 system review of systems was obtained and all systems are negative except as noted in the HPI and PMH.   Patient's Health History    Past Medical History:  Diagnosis Date   Allergic asthma    Anemia    Anxiety    Arthritis    Depression    Diabetes mellitus without complication (HCC)    Diverticulosis    Fatty liver    moderate to severe   GERD (gastroesophageal reflux disease)    Graves disease    Headache    tension, ocular migraines   Hypothyroidism    Internal hemorrhoids    Pneumonia    as an infant   Sleep apnea    Thyroid disease    Uterine fibroid     Past Surgical History:  Procedure Laterality Date   ABDOMINAL HYSTERECTOMY     Per patient, uterine fibroids.   ABDOMINAL HYSTERECTOMY     CARPAL TUNNEL RELEASE Right 06/11/2021   Procedure: Right Carpel tunnel release;  Surgeon: Vallarie Mare, MD;  Location: [place];  Service: Neurosurgery;  Laterality: Right;  RM 18   CHOLECYSTECTOMY N/A 11/06/2014   Procedure: LAPAROSCOPIC CHOLECYSTECTOMY;  Surgeon: Jamesetta So, MD;  Location: [facility];  Service: General;  Laterality: N/A;   COLONOSCOPY N/A 03/04/2017   Dr. Gala Romney: Hemorrhoids, diverticulosis, benign polyp without adenomatous changes.  Next colonoscopy 10 years    DILATION AND CURETTAGE OF UTERUS     ESOPHAGOGASTRODUODENOSCOPY N/A 10/02/2014   Dr. Gala Romney: fundic gland polyps, hiatal hernia   ESOPHAGOGASTRODUODENOSCOPY  07/2020   [facility] ; normal esophagus biopsied, benign-appearing gastric polyps biopsied, gastric biopsies obtained to evaluate for H. pylori, normal duodenum.  Pathology with mild chronic gastritis, negative for H. pylori, fundic gland polyps, esophageal biopsy benign.   MOUTH SURGERY     extraction of teeth   POLYPECTOMY  03/04/2017   Procedure: POLYPECTOMY;  Surgeon: Daneil Dolin, MD;  Location: [facility];  Service: Endoscopy;;  colon   THYROIDECTOMY N/A 09/30/2018   Procedure: TOTAL THYROIDECTOMY;  Surgeon: Armandina Gemma, MD;  Location: [facility];  Service: General;  Laterality: N/A;   TUBAL LIGATION      Family History  Problem Relation Age of Onset   Uterine cancer Mother        ?cervical cancer   Hypertension Father    Diabetes Father    Uterine cancer Sister        suicide   Colon cancer Paternal Aunt 46   Depression Other    Thyroid disease Neg Hx    Pancreatic cancer Neg Hx     Social History   Socioeconomic History   Marital status: Married    Spouse name: Not on file   Number of children:  Not on file   Years of education: Not on file   Highest education level: Not on file  Occupational History   Not on file  Tobacco Use   Smoking status: Former    Packs/day: 1.00    Years: 5.00    Pack years: 5.00    Types: Cigarettes    Quit date: 11/03/1997    Years since quitting: 23.6   Smokeless tobacco: Never   Tobacco comments:    1 pack 1 week  Vaping Use   Vaping Use: Never used  Substance and Sexual Activity   Alcohol use: No    Alcohol/week: 0.0 standard drinks   Drug use: No   Sexual activity: Yes    Birth control/protection: Surgical  Other Topics Concern   Not on file  Social History Narrative   Not on file   Social Determinants of Health   Financial Resource Strain: Not on file   Food Insecurity: Not on file  Transportation Needs: Not on file  Physical Activity: Not on file  Stress: Not on file  Social Connections: Not on file  Intimate Partner Violence: Not on file     Physical Exam   Vitals:   06/22/21 0430 06/22/21 0600  BP: (!) 153/79 (!) 144/81  Pulse: (!) 106 90  Resp: 20 14  Temp:    SpO2: 100% 100%    CONSTITUTIONAL: Well-appearing, NAD NEURO:  Alert and oriented x 3, no focal deficits EYES:  eyes equal and reactive ENT/NECK:  no LAD, no JVD CARDIO: Regular rate, well-perfused, normal S1 and S2 PULM:  CTAB no wheezing or rhonchi GI/GU:  normal bowel sounds, non-distended, non-tender MSK/SPINE:  No gross deformities, no edema SKIN:  no rash, atraumatic PSYCH:  Appropriate speech and behavior  *Additional and/or pertinent findings included in MDM below  Diagnostic and Interventional Summary    EKG Interpretation  Date/Time:  Saturday June 22 2021 01:55:33 EDT Ventricular Rate:  86 PR Interval:  150 QRS Duration: 77 QT Interval:  392 QTC Calculation: 469 R Axis:   48 Text Interpretation: Sinus rhythm Low voltage, precordial leads Borderline T wave abnormalities Confirmed by Gerlene Fee [phone]) on 06/22/2021 3:32:39 AM       Labs Reviewed  COMPREHENSIVE METABOLIC PANEL - Abnormal; Notable for the following components:      Result Value   Glucose, Bld 172 (*)    Calcium 8.5 (*)    Total Bilirubin 0.1 (*)    All other components within normal limits  CBC  TROPONIN I (HIGH SENSITIVITY)  TROPONIN I (HIGH SENSITIVITY)    CT Angio Chest Pulmonary Embolism (PE) W or WO Contrast  Final Result    DG Chest Portable 1 View  Final Result      Medications  albuterol (VENTOLIN HFA) 108 (90 Base) MCG/ACT inhaler 2 puff (2 puffs Inhalation Given 06/22/21 0320)  ipratropium (ATROVENT) 0.02 % nebulizer solution (0.5 mg  Given 06/22/21 0322)  albuterol (PROVENTIL) (2.5 MG/3ML) 0.083% nebulizer solution (5 mg  Given 06/22/21 0322)   iohexol (OMNIPAQUE) 350 MG/ML injection 100 mL (100 mLs Intravenous Contrast Given 06/22/21 0455)     Procedures  /  Critical Care Procedures  ED Course and Medical Decision Making  I have reviewed the triage vital signs, the nursing notes, and pertinent available records from the EMR.  Listed above are laboratory and imaging tests that I personally ordered, reviewed, and interpreted and then considered in my medical decision making (see below for details).  Shortness of breath,  no wheezes, lungs clear, recent orthopedic surgery, will CT to evaluate for PE.     CTA is normal, patient is feeling a lot better, vital signs normal, appropriate for discharge.  Barth Kirks. Sedonia Small, [place] [email or web address]  Final Clinical Impressions(s) / ED Diagnoses     ICD-10-CM   1. Exacerbation of asthma, unspecified asthma severity, unspecified whether persistent  J45.901       ED Discharge Orders     None        Discharge Instructions Discussed with and Provided to Patient:     Discharge Instructions      You were evaluated in the Emergency Department and after careful evaluation, we did not find any emergent condition requiring admission or further testing in the hospital.  Your exam/testing today was overall reassuring.  Symptoms seem to be due to asthma exacerbation.  CT scan did not show any emergencies or blood clots.  Recommend close follow-up with your primary care doctor to discuss her symptoms.  Please return to the Emergency Department if you experience any worsening of your condition.  Thank you for allowing [place] to be a part of your care.         Maudie Flakes, MD 06/22/21 [phone]

## 2021-06-22 NOTE — ED Triage Notes (Signed)
Pt with c/o SOB since yesterday. States she has been using breathing treatments at home (hx of asthma) without any relief.

## 2021-06-24 ENCOUNTER — Encounter (HOSPITAL_COMMUNITY): Payer: Self-pay | Admitting: Radiology

## 2021-06-24 ENCOUNTER — Ambulatory Visit (HOSPITAL_COMMUNITY): Admission: RE | Admit: 2021-06-24 | Payer: Medicaid Other | Source: Ambulatory Visit

## 2021-06-26 ENCOUNTER — Other Ambulatory Visit: Payer: Self-pay

## 2021-06-26 ENCOUNTER — Ambulatory Visit (HOSPITAL_COMMUNITY)
Admission: RE | Admit: 2021-06-26 | Discharge: 2021-06-26 | Disposition: A | Discharge status: Home / Self Care | Payer: Medicaid Other | Source: Ambulatory Visit | Attending: Gastroenterology | Admitting: Gastroenterology

## 2021-06-26 DIAGNOSIS — R9389 Abnormal findings on diagnostic imaging of other specified body structures: Secondary | ICD-10-CM | POA: Diagnosis present

## 2021-06-26 IMAGING — MR MR PELVIS WO/W CM
15 of 19 series · 33 of 48 positions shown · IV contrast (gadavist)
Comparison: Abdominal MRI and prior CTs of the abdomen and pelvis.

CLINICAL DATA: Cystic pelvic lesion in a 45-year-old female
discovered on abdominal MRI.

EXAM:
MRI PELVIS WITHOUT AND WITH CONTRAST
TECHNIQUE: Multiplanar multisequence MR imaging of the pelvis was performed
both before and after administration of intravenous contrast.
CONTRAST:  10mL GADAVIST GADOBUTROL 1 MMOL/ML IV SOLN

[Series 2: T2 · coronal · 5.0mm · 1.41mm/px · 3 of 50 slices shown (1 of 4)]
[im 1/50]
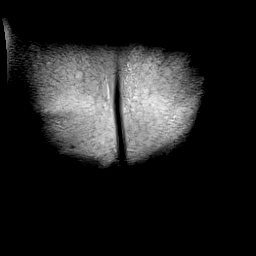
[im 25/50]
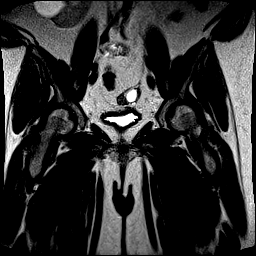
[im 50/50]
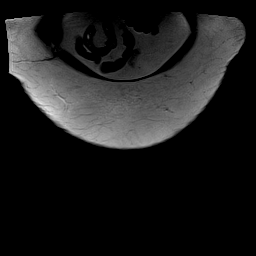

[Series 3: T2 · sagittal · 5.0mm · 1.02mm/px · 2 of 41 slices shown (2 of 4)]
[im 1/41]
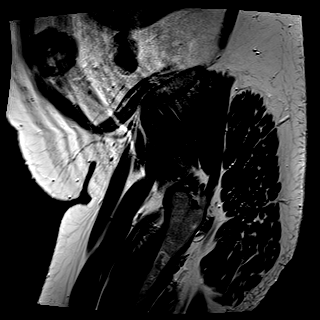
[im 41/41]
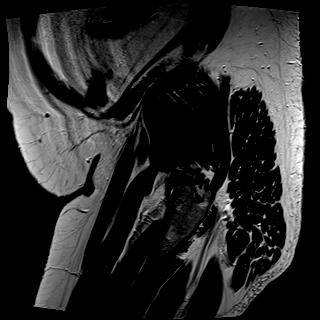

[Series 4: T2 · axial · 5.0mm · 1.00mm/px · z∈[-44,+190]mm · 2 of 40 slices shown (3 of 4)]
[im 1/40]
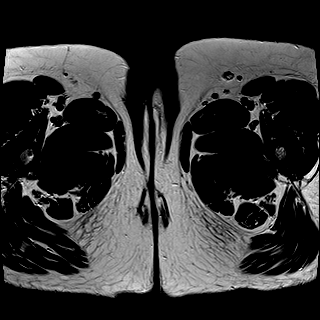
[im 40/40]
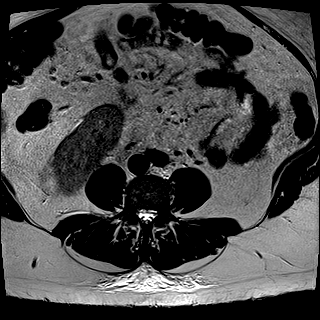

[Series 5: T2 fat-sat · axial · 5.0mm · 1.00mm/px · z∈[-44,+190]mm · 2 of 40 slices shown]
[im 1/40]
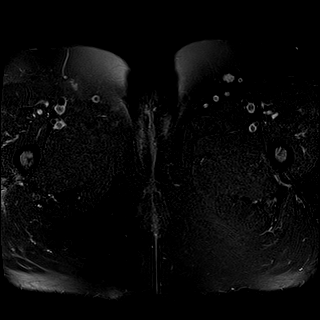
[im 40/40]
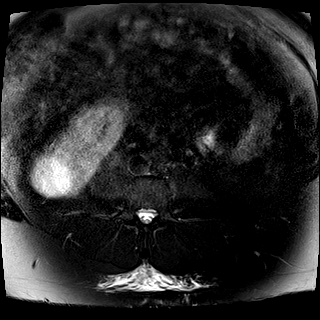

[Series 6: T2 · coronal · 5.0mm · 0.55mm/px · 2 of 42 slices shown (4 of 4)]
[im 1/42]
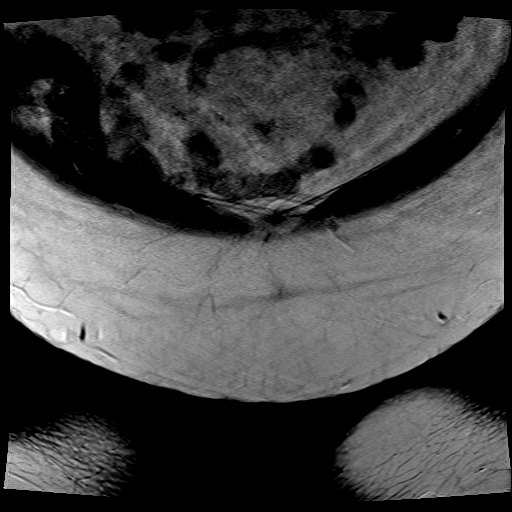
[im 42/42]
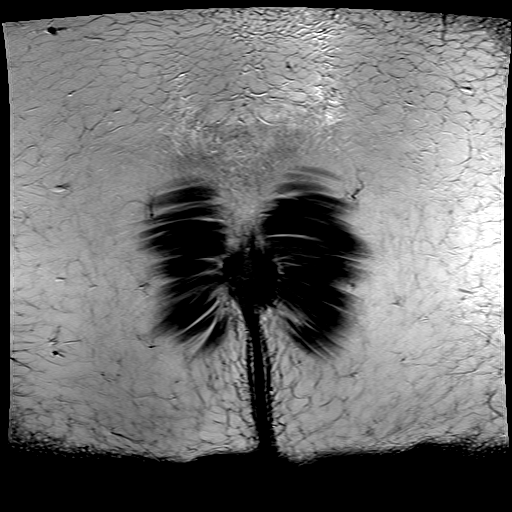

[Series 7: DWI · axial · 6.0mm · 1.49mm/px · 1 of 33 slices shown (1 of 3)]
[im 1/33]
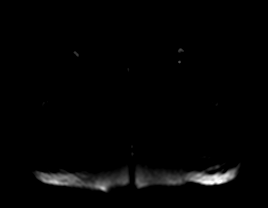

[Series 7: DWI · axial · 6.0mm · 1.49mm/px · 1 of 33 slices shown (2 of 3)]
[im 1/33]
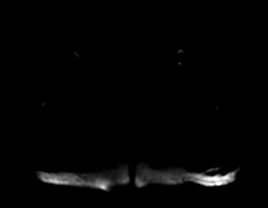

[Series 7: DWI · axial · 6.0mm · 1.49mm/px · 1 of 33 slices shown (3 of 3)]
[im 1/33]
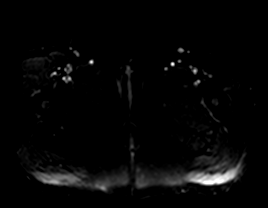

[Series 8: ax dwi_adc · axial · 6.0mm · 1.49mm/px · 1 of 33 slices shown]
[im 1/33]
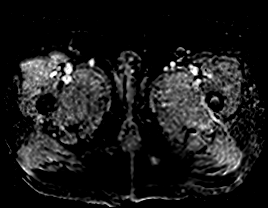

[Series 9: ax in and · axial · 3.0mm · 1.02mm/px · z∈[-59,+202]mm · 3 of 88 slices shown (1 of 2)]
[im 1/88]
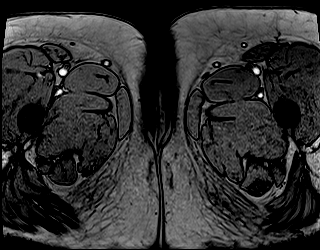
[im 44/88]
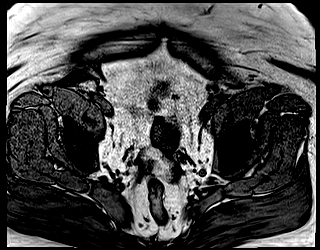
[im 88/88]
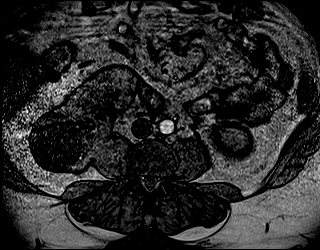

[Series 10: ax in and · axial · 3.0mm · 1.02mm/px · z∈[-59,+202]mm · 3 of 88 slices shown (2 of 2)]
[im 1/88]
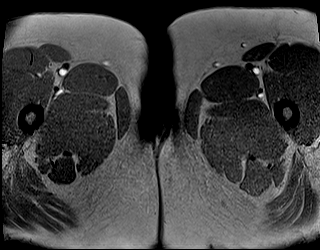
[im 44/88]
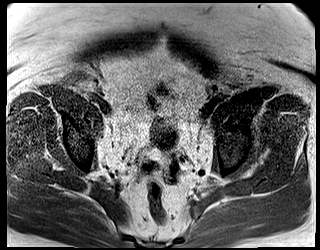
[im 88/88]
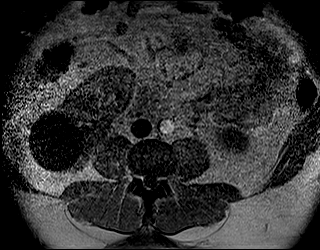

[Series 11: T1 dynamic fat-sat · axial · 3.0mm · 0.63mm/px · z∈[-59,+202]mm · 3 of 88 slices shown]
[im 1/88]
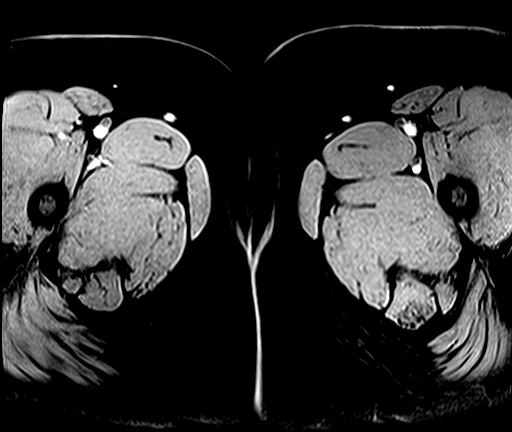
[im 44/88]
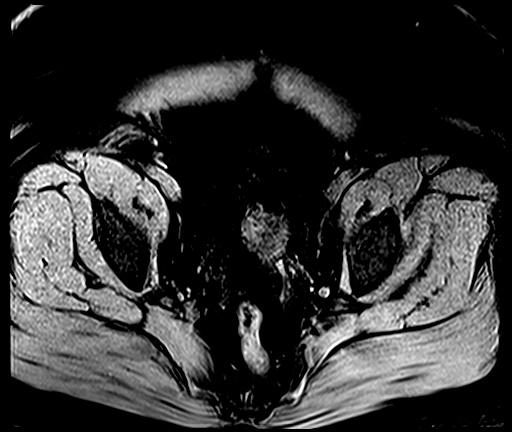
[im 88/88]
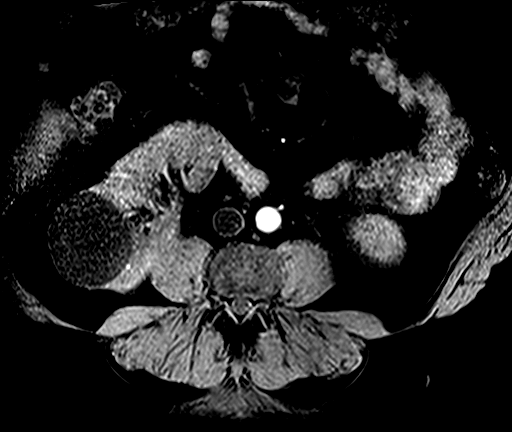

[Series 12: T1 dynamic fat-sat post-contrast · axial · 3.0mm · 0.63mm/px · z∈[-59,+202]mm · 3 of 88 slices shown (1 of 3)]
[im 1/88]
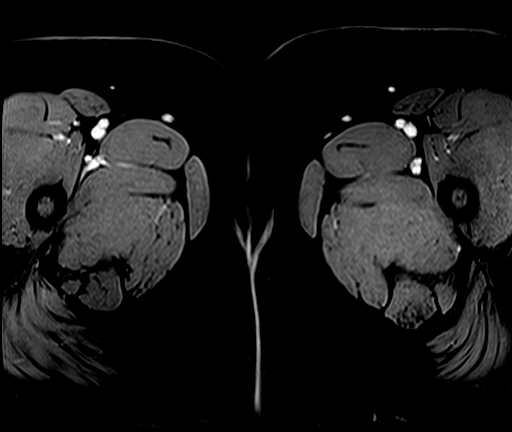
[im 44/88]
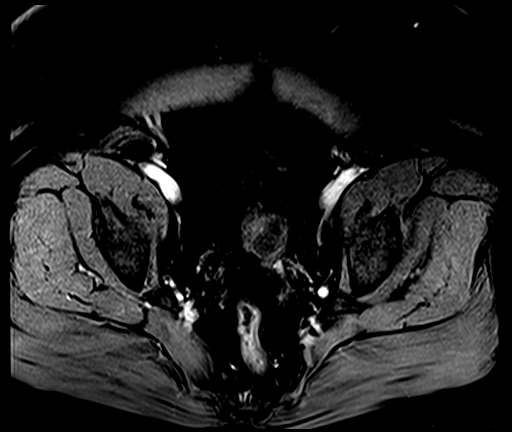
[im 88/88]
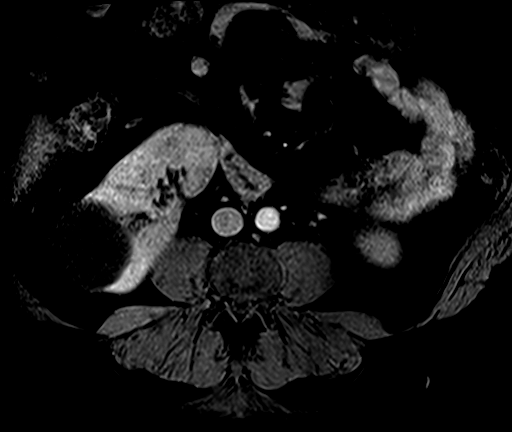

[Series 13: T1 dynamic fat-sat post-contrast · axial · 3.0mm · 0.63mm/px · z∈[-59,+202]mm · 3 of 88 slices shown (2 of 3)]
[im 1/88]
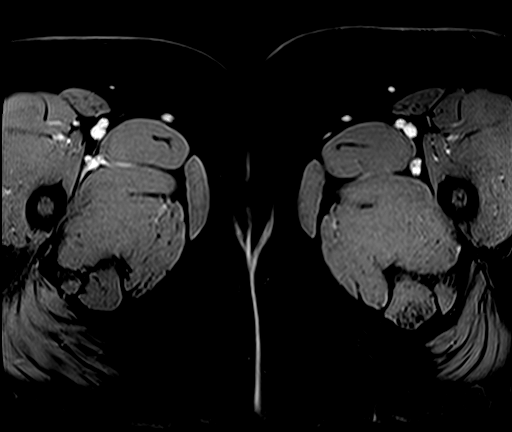
[im 44/88]
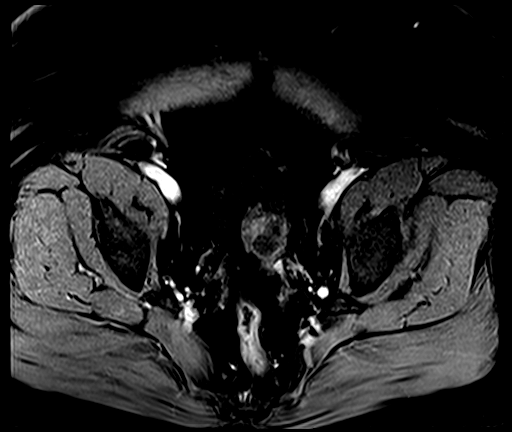
[im 88/88]
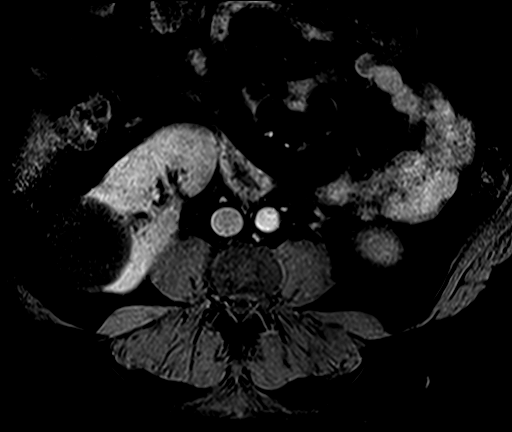

[Series 14: T1 dynamic fat-sat post-contrast · axial · 3.0mm · 0.63mm/px · z∈[-59,+202]mm · 3 of 88 slices shown (3 of 3)]
[im 1/88]
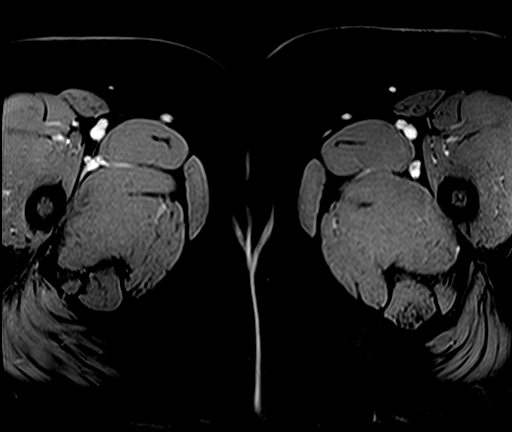
[im 44/88]
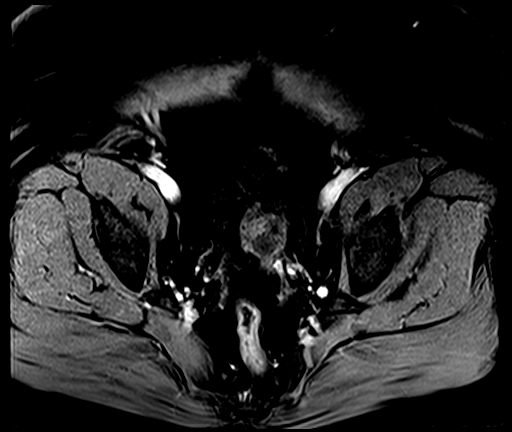
[im 88/88]
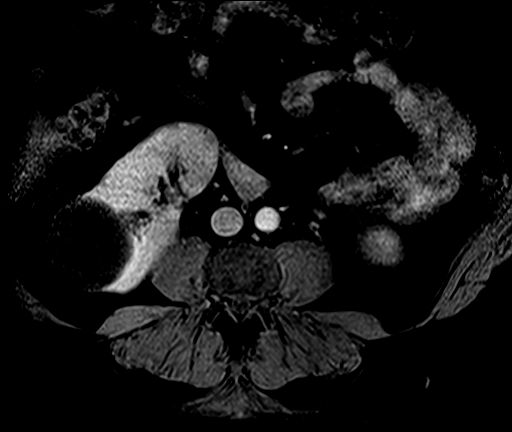

[33 of 48 positions shown; findings below may reference images not displayed]

Most recent comparison study is from [DATE] with CT of the
abdomen and pelvis and MRI, most recent MR evaluation from [facility]
FINDINGS: Urinary Tract: No distal ureteral dilation. Smooth contour of the
urinary bladder.

Bowel: Unremarkable appearance of the gastrointestinal tract to the
extent evaluated on this pelvic MRI.

Vascular/Lymphatic: Patent pelvic vessels. No pelvic sidewall
lymphadenopathy.

Reproductive: Post hysterectomy. Small cyst associated with the LEFT
ovary. Large cystic lesion associated with the RIGHT ovary with
lobular contours but with smooth margins. No internal nodular
enhancement. This measures 9.7 x 6.6 x 8.7 cm. Ovary along the
margin with small cysts.

Other:  No ascites

Musculoskeletal: No suspicious bone lesions identified.
IMPRESSION: Large cystic lesion associated with the RIGHT ovary measuring up to
9.7 cm. This shows some enlargement over time or has developed as a
larger cystic area in the interval since [DATE]. Tissue
along the margin appears to be related to the RIGHT ovary rather
than enhancing nodularity. Differential considerations at this time
would include borderline tumor or large cyst with involution of an
adjacent smaller cyst. Consider 6-8 week follow-up with pelvic
sonogram for further evaluation. Gyn consultation may be helpful if
not yet obtained.

Post hysterectomy.

Small cyst associated with the LEFT ovary.

No pelvic ascites

## 2021-06-26 MED ORDER — GADOBUTROL 1 MMOL/ML IV SOLN
10.0000 mL | Freq: Once | INTRAVENOUS | Status: AC | PRN
  Administered 2021-06-26: 10 mL via INTRAVENOUS

## 2021-06-27 ENCOUNTER — Other Ambulatory Visit: Payer: Self-pay

## 2021-06-27 DIAGNOSIS — N839 Noninflammatory disorder of ovary, fallopian tube and broad ligament, unspecified: Secondary | ICD-10-CM

## 2021-07-03 ENCOUNTER — Encounter (HOSPITAL_COMMUNITY): Payer: Self-pay

## 2021-07-03 ENCOUNTER — Emergency Department (HOSPITAL_COMMUNITY): Payer: Medicaid Other

## 2021-07-03 ENCOUNTER — Emergency Department (HOSPITAL_COMMUNITY)
Admission: EM | Admit: 2021-07-03 | Discharge: 2021-07-03 | Disposition: A | Discharge status: Home / Self Care | Payer: Medicaid Other | Attending: Emergency Medicine | Admitting: Emergency Medicine

## 2021-07-03 ENCOUNTER — Other Ambulatory Visit: Payer: Self-pay

## 2021-07-03 DIAGNOSIS — E119 Type 2 diabetes mellitus without complications: Secondary | ICD-10-CM | POA: Insufficient documentation

## 2021-07-03 DIAGNOSIS — R200 Anesthesia of skin: Secondary | ICD-10-CM

## 2021-07-03 DIAGNOSIS — R209 Unspecified disturbances of skin sensation: Secondary | ICD-10-CM | POA: Diagnosis present

## 2021-07-03 DIAGNOSIS — R519 Headache, unspecified: Secondary | ICD-10-CM | POA: Insufficient documentation

## 2021-07-03 DIAGNOSIS — R0789 Other chest pain: Secondary | ICD-10-CM | POA: Diagnosis not present

## 2021-07-03 DIAGNOSIS — R739 Hyperglycemia, unspecified: Secondary | ICD-10-CM | POA: Insufficient documentation

## 2021-07-03 DIAGNOSIS — E039 Hypothyroidism, unspecified: Secondary | ICD-10-CM | POA: Insufficient documentation

## 2021-07-03 DIAGNOSIS — Z87891 Personal history of nicotine dependence: Secondary | ICD-10-CM | POA: Diagnosis not present

## 2021-07-03 LAB — CBC WITH DIFFERENTIAL/PLATELET
Abs Immature Granulocytes: 0.02 10*3/uL (ref 0.00–0.07)
Basophils Absolute: 0 10*3/uL (ref 0.0–0.1)
Basophils Relative: 0 %
Eosinophils Absolute: 0.1 10*3/uL (ref 0.0–0.5)
Eosinophils Relative: 1 %
HCT: 40.1 % (ref 36.0–46.0)
Hemoglobin: 13 g/dL (ref 12.0–15.0)
Immature Granulocytes: 0 %
Lymphocytes Relative: 21 %
Lymphs Abs: 1.6 10*3/uL (ref 0.7–4.0)
MCH: 28.7 pg (ref 26.0–34.0)
MCHC: 32.4 g/dL (ref 30.0–36.0)
MCV: 88.5 fL (ref 80.0–100.0)
Monocytes Absolute: 0.3 10*3/uL (ref 0.1–1.0)
Monocytes Relative: 4 %
Neutro Abs: 5.6 10*3/uL (ref 1.7–7.7)
Neutrophils Relative %: 74 %
Platelets: 290 10*3/uL (ref 150–400)
RBC: 4.53 MIL/uL (ref 3.87–5.11)
RDW: 14.2 % (ref 11.5–15.5)
WBC: 7.5 10*3/uL (ref 4.0–10.5)
nRBC: 0 % (ref 0.0–0.2)

## 2021-07-03 LAB — COMPREHENSIVE METABOLIC PANEL
ALT: 37 U/L (ref 0–44)
AST: 36 U/L (ref 15–41)
Albumin: 4.1 g/dL (ref 3.5–5.0)
Alkaline Phosphatase: 61 U/L (ref 38–126)
Anion gap: 10 (ref 5–15)
BUN: 10 mg/dL (ref 6–20)
CO2: 24 mmol/L (ref 22–32)
Calcium: 9.4 mg/dL (ref 8.9–10.3)
Chloride: 101 mmol/L (ref 98–111)
Creatinine, Ser: 0.71 mg/dL (ref 0.44–1.00)
GFR, Estimated: >60 mL/min (ref >60)
Glucose, Bld: 255 mg/dL — ABNORMAL HIGH (ref 70–99)
Potassium: 3.6 mmol/L (ref 3.5–5.1)
Sodium: 135 mmol/L (ref 135–145)
Total Bilirubin: 0.6 mg/dL (ref 0.3–1.2)
Total Protein: 8 g/dL (ref 6.5–8.1)

## 2021-07-03 LAB — TROPONIN I (HIGH SENSITIVITY)
Troponin I (High Sensitivity): 4 ng/L (ref <18)
Troponin I (High Sensitivity): 4 ng/L (ref <18)

## 2021-07-03 LAB — TSH: TSH: 0.639 u[IU]/mL (ref 0.350–4.500)

## 2021-07-03 IMAGING — DX DG CHEST 1V PORT
1 series · 1 of 1 positions shown · non-contrast
Comparison: [DATE].

CLINICAL DATA: Freestyle Libre device removed prior to imaging. Pt
c/o cp onset since today. Denies sob, fever or N/V. HX of asthma and
diabetes.

EXAM:
PORTABLE CHEST 1 VIEW

[chest ap]
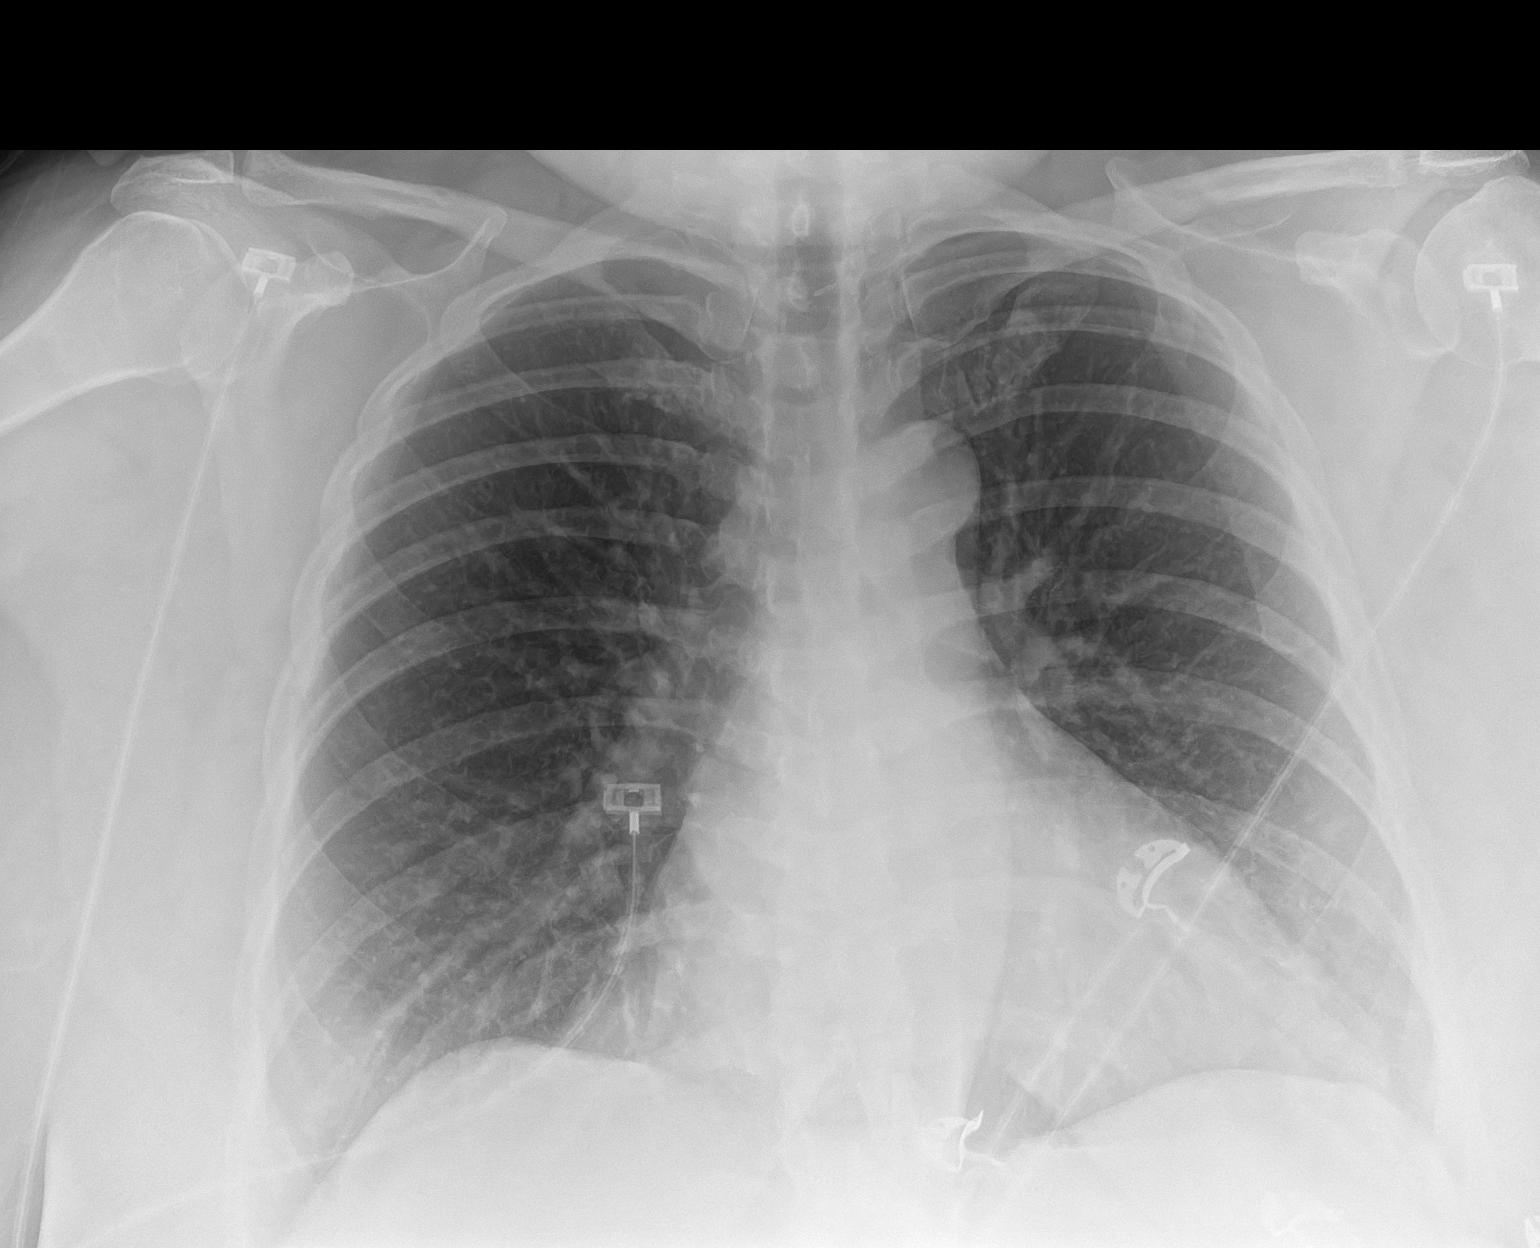

[1 of 1 positions shown; findings below may reference images not displayed]

FINDINGS: Cardiac silhouette is normal in size and configuration. Normal
mediastinal and hilar contours.

Clear lungs.  No pleural effusion or pneumothorax.

Skeletal structures are grossly intact.
IMPRESSION: No active disease.

## 2021-07-03 IMAGING — MR MR HEAD W/O CM
9 of 10 series · 39 of 48 positions shown · non-contrast
Comparison: [DATE]

CLINICAL DATA: Right-sided facial numbness, stroke suspected

EXAM:
MRI HEAD WITHOUT CONTRAST
TECHNIQUE: Multiplanar, multiecho pulse sequences of the brain and surrounding
structures were obtained without intravenous contrast.

[Series 5: DWI · axial · 4.0mm · 0.88mm/px · z∈[-37,+101]mm · 5 of 36 slices shown (1 of 4)]
[im 1/36]
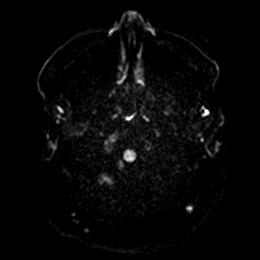
[im 9/36]
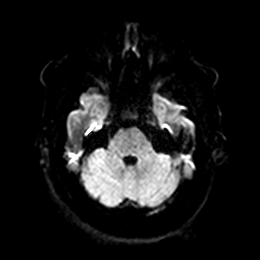
[im 18/36]
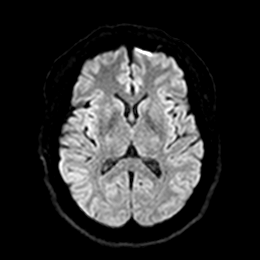
[im 27/36]
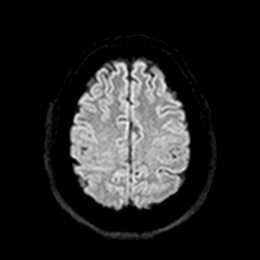
[im 36/36]
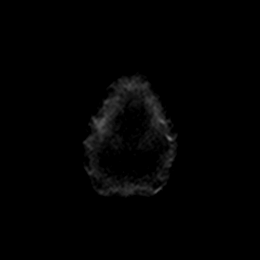

[Series 6: DWI · axial · 4.0mm · 0.88mm/px · z∈[-37,+101]mm · 5 of 36 slices shown (2 of 4)]
[im 1/36]
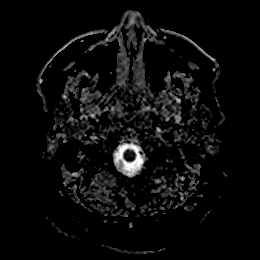
[im 9/36]
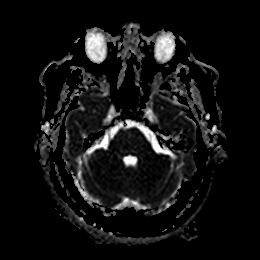
[im 18/36]
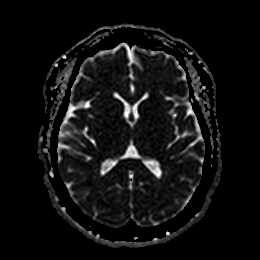
[im 27/36]
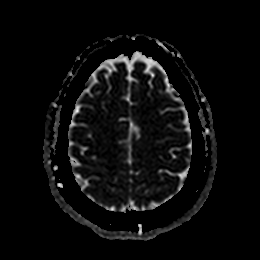
[im 36/36]
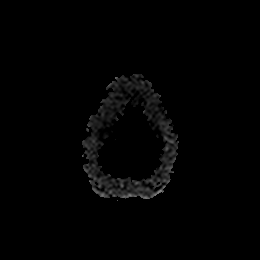

[Series 7: DWI · coronal · 4.0mm · 0.88mm/px · 5 of 32 slices shown (3 of 4)]
[im 1/32]
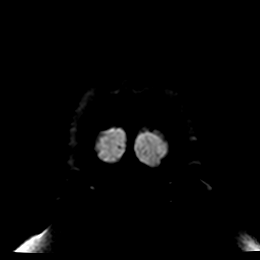
[im 8/32]
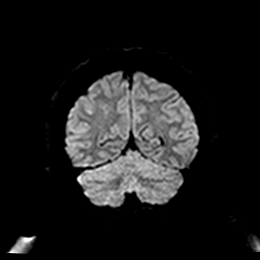
[im 16/32]
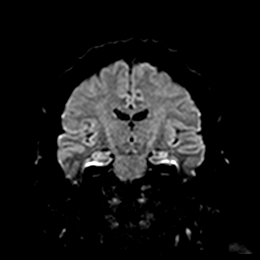
[im 24/32]
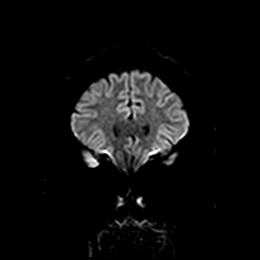
[im 32/32]
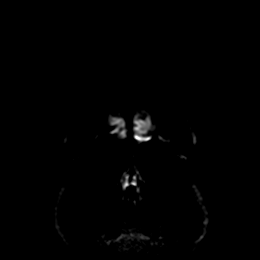

[Series 8: DWI · coronal · 4.0mm · 0.88mm/px · 5 of 32 slices shown (4 of 4)]
[im 1/32]
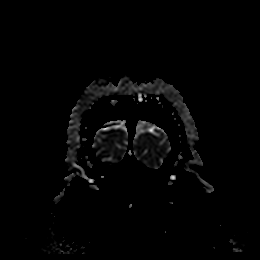
[im 8/32]
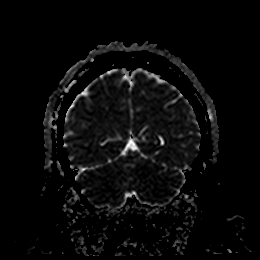
[im 16/32]
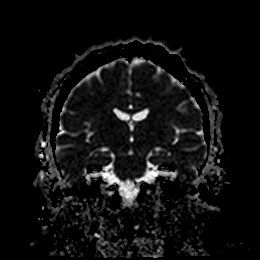
[im 24/32]
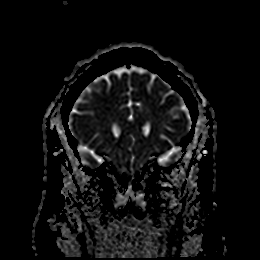
[im 32/32]
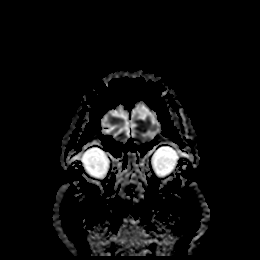

[Series 9: T1 · sagittal · 5.0mm · 0.80mm/px · 3 of 23 slices shown]
[im 1/23]
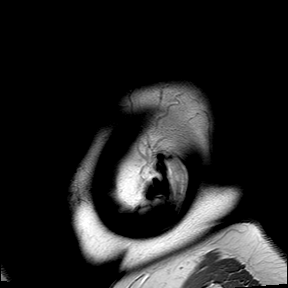
[im 12/23]
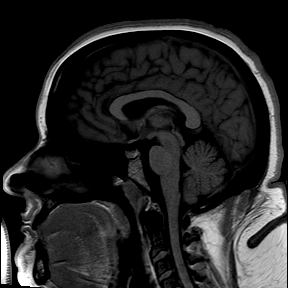
[im 23/23]
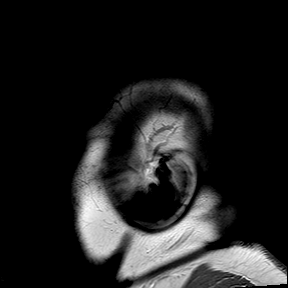

[Series 10: T2 · axial · 5.0mm · 0.72mm/px · z∈[-44,+108]mm · 3 of 23 slices shown (1 of 2)]
[im 1/23]
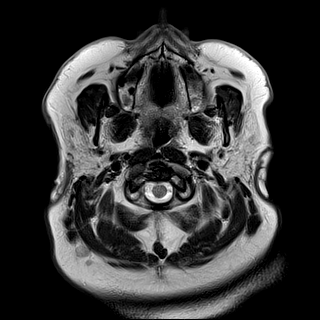
[im 12/23]
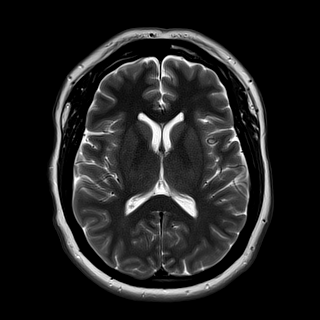
[im 23/23]
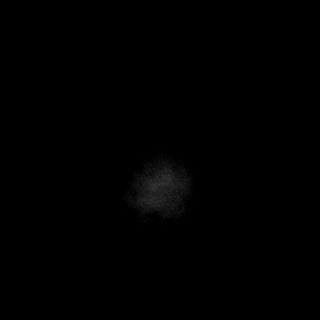

[Series 11: ax hemo · axial · 5.0mm · 0.86mm/px · z∈[-40,+102]mm · 4 of 25 slices shown]
[im 1/25]
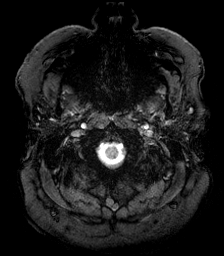
[im 9/25]
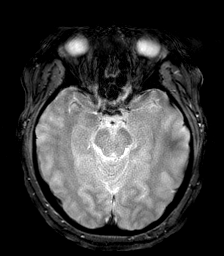
[im 17/25]
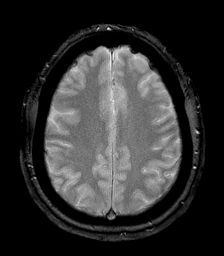
[im 25/25]
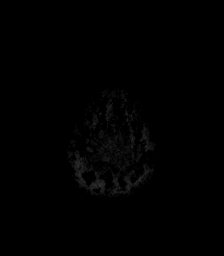

[Series 12: FLAIR · axial · 4.0mm · 0.43mm/px · z∈[-42,+104]mm · 5 of 38 slices shown]
[im 1/38]
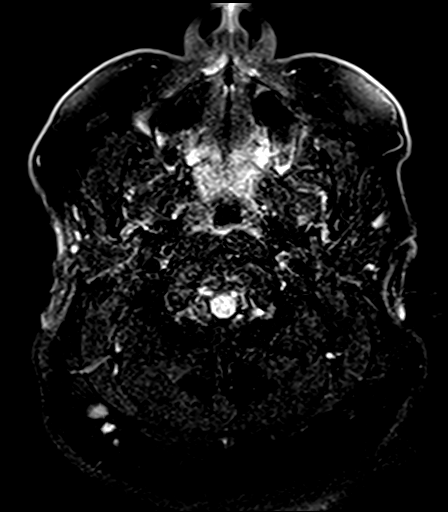
[im 10/38]
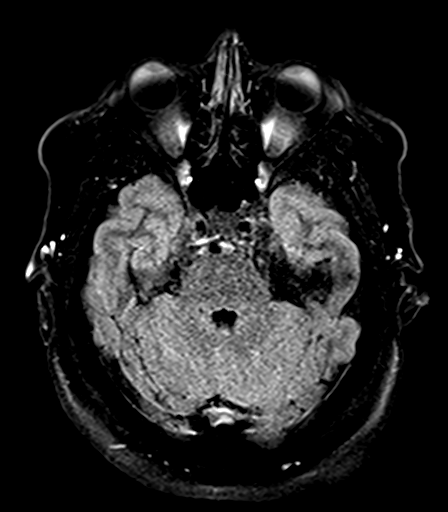
[im 19/38]
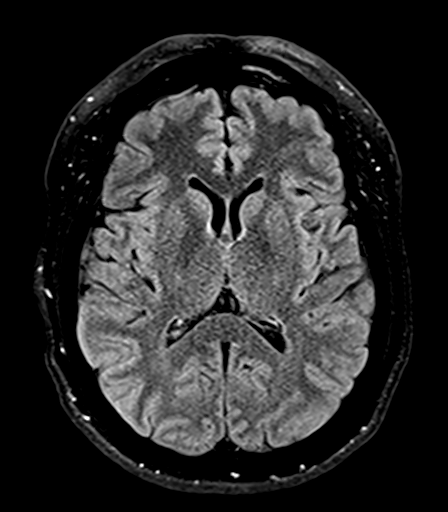
[im 28/38]
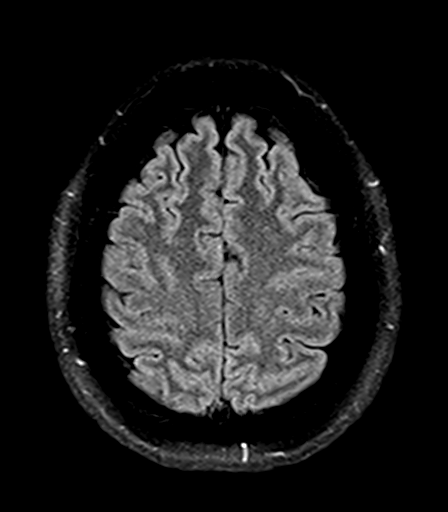
[im 38/38]
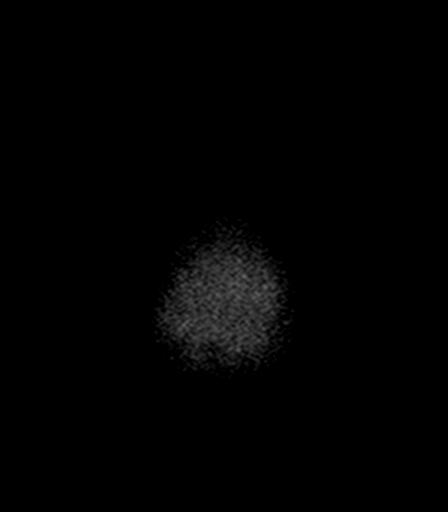

[Series 14: T2 · coronal · 5.0mm · 0.72mm/px · 4 of 28 slices shown (2 of 2)]
[im 1/28]
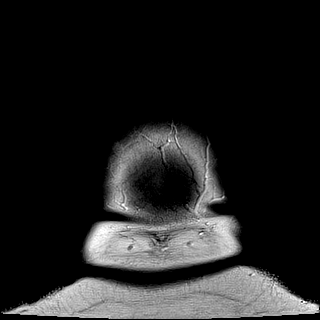
[im 10/28]
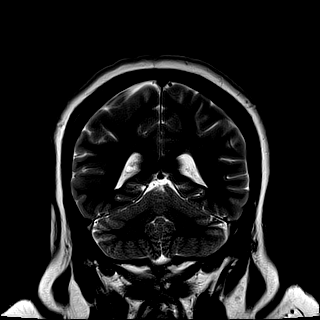
[im 19/28]
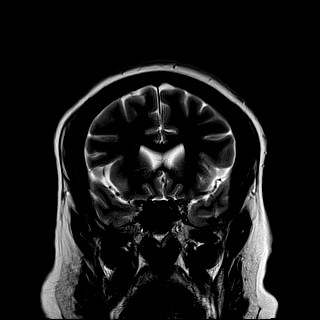
[im 28/28]
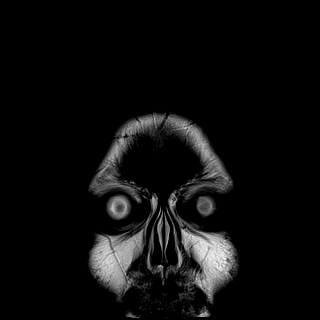

[39 of 48 positions shown; findings below may reference images not displayed]

FINDINGS: Brain: No acute infarction, hemorrhage, hydrocephalus, extra-axial
collection, or mass lesion. The ventricles and sulci are within
normal limits for age.

Vascular: Normal flow voids.

Skull and upper cervical spine: Normal marrow signal.

Sinuses/Orbits: Negative.

Other: The mastoids are well aerated.
IMPRESSION: No acute intracranial process.

## 2021-07-03 MED ORDER — SODIUM CHLORIDE 0.9 % IV SOLN: INTRAVENOUS | Status: DC

## 2021-07-03 MED ORDER — SODIUM CHLORIDE 0.9 % IV BOLUS
500.0000 mL | Freq: Once | INTRAVENOUS | Status: AC
  Administered 2021-07-03: 500 mL via INTRAVENOUS

## 2021-07-03 NOTE — ED Provider Notes (Signed)
Emergency Medicine Provider Triage Evaluation Note  Heather Fry , a 45 y.o. female  was evaluated in triage.  Pt complains of right-sided facial numbness.  Started about 5 PM yesterday, comes and goes.  No other unilateral weakness.  Patient reports that she is also, having body aches, reports decreased appetite and thirst.  She is status post thyroidectomy, previous episodes of this are usually due to dehydration or electrolyte imbalance.  Review of Systems  Positive: Facial pain, Negative: Headache, weakness  Physical Exam  BP (!) 153/90 (BP Location: Left Arm)   Pulse 90   Temp 99.4 F (37.4 C)   Resp 20   Ht 5\' 4"  (1.626 m)   Wt 120.2 kg   LMP 10/09/2014   SpO2 99%   BMI 45.49 kg/m  Gen:   Awake, no distress   Resp:  Normal effort  MSK:   Moves extremities without difficulty  Other:  Cranial nerves III through XII grossly intact.  Sensation light touch grossly intact to the face bilaterally, no facial droop.  Grip strength equal bilaterally.  Medical Decision Making  Medically screening exam initiated at 2:54 PM.  Appropriate orders placed.  JORITA BOHANON was informed that the remainder of the evaluation will be completed by another provider, this initial triage assessment does not replace that evaluation, and the importance of remaining in the ED until their evaluation is complete.  Doubt TIA or stroke, outside of window.  Will check electrolytes and work-up further in the back.   Sherrill Raring, PA-C 07/03/21 1456    Lajean Saver, MD 07/03/21 [phone]

## 2021-07-03 NOTE — ED Provider Notes (Signed)
[facility] EMERGENCY DEPARTMENT Provider Note   CSN: [id number] Arrival date & time: 07/03/21  1331     History Chief Complaint  Patient presents with   Facial Pain    Heather Fry is a 45 y.o. female.  Patient with a complaint of right-sided facial numbness that started last night.  Patient took 2 Tums it went away but then has come back.  She also had some mild discomfort in the anterior part of her chest.  She also knows her blood sugars been running high.  Has not been eating properly.  Patient states that normally when she gets that numbness symptoms her calcium is low.  Patient has a history of thyroid disease.  Diabetes.      Past Medical History:  Diagnosis Date   Allergic asthma    Anemia    Anxiety    Arthritis    Depression    Diabetes mellitus without complication (Heather Fry)    Diverticulosis    Fatty liver    moderate to severe   GERD (gastroesophageal reflux disease)    Graves disease    Headache    tension, ocular migraines   Hypothyroidism    Internal hemorrhoids    Pneumonia    as an infant   Sleep apnea    Thyroid disease    Uterine fibroid     Patient Active Problem List   Diagnosis Date Noted   Diabetes (Heather Fry) 05/30/2021   Irritable bowel syndrome 04/17/2021   Weight gain 12/20/2020   Diarrhea 05/08/2020   Idiopathic peripheral neuropathy 04/18/2020   Myelopathy ([place]) 04/18/2020   OSA on CPAP 04/18/2020   RLS (restless legs syndrome) 04/18/2020   Sensory disturbance 01/20/2020   Atypical chest pain 01/20/2020   Obesity, Class III, BMI 40-49.9 (morbid obesity) (Heather Fry) 01/20/2020   Weakness 01/19/2020   Hyperglycemia 10/04/2019   Myalgia 09/05/2019   Vitamin B 12 deficiency 07/28/2019   Right sided numbness 06/29/2019   Constipation 06/15/2019   Elevated lipase 06/15/2019   Hypomagnesemia 03/29/2019   Leukocytosis 03/29/2019   Pain of upper abdomen 02/10/2019   Nausea without vomiting 02/10/2019   Tingling of face 11/30/2018   Hot  flashes 11/30/2018   Vitamin D deficiency 10/06/2018   GERD (gastroesophageal reflux disease) 01/28/2017   Rectal bleeding 01/28/2017   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   ABDOMINAL HYSTERECTOMY     CARPAL TUNNEL RELEASE Right 06/11/2021   Procedure: Right Carpel tunnel release;  Surgeon: Vallarie Mare, MD;  Location: [place];  Service: Neurosurgery;  Laterality: Right;  RM 18   CHOLECYSTECTOMY N/A 11/06/2014   Procedure: LAPAROSCOPIC CHOLECYSTECTOMY;  Surgeon: Jamesetta So, MD;  Location: [facility];  Service: General;  Laterality: N/A;   COLONOSCOPY N/A 03/04/2017   Dr. Gala Romney: Hemorrhoids, diverticulosis, benign polyp without adenomatous changes.  Next colonoscopy 10 years   DILATION AND CURETTAGE OF UTERUS     ESOPHAGOGASTRODUODENOSCOPY N/A 10/02/2014   Dr. Gala Romney: fundic gland polyps, hiatal hernia   ESOPHAGOGASTRODUODENOSCOPY  07/2020   [facility] ; normal esophagus biopsied, benign-appearing gastric polyps biopsied, gastric biopsies obtained to evaluate for H. pylori, normal duodenum.  Pathology with mild chronic gastritis, negative for H. pylori, fundic gland polyps, esophageal biopsy  benign.   MOUTH SURGERY     extraction of teeth   POLYPECTOMY  03/04/2017   Procedure: POLYPECTOMY;  Surgeon: Daneil Dolin, MD;  Location: [facility];  Service: Endoscopy;;  colon   THYROIDECTOMY N/A 09/30/2018   Procedure: TOTAL THYROIDECTOMY;  Surgeon: Armandina Gemma, MD;  Location: [facility];  Service: General;  Laterality: N/A;   TUBAL LIGATION       OB History     Gravida  2   Para  2   Term  2   Preterm      AB      Living  2      SAB      IAB      Ectopic      Multiple      Heather Births              Family  History  Problem Relation Age of Onset   Uterine cancer Mother        ?cervical cancer   Hypertension Father    Diabetes Father    Uterine cancer Sister        suicide   Colon cancer Paternal Aunt 62   Depression Other    Thyroid disease Neg Hx    Pancreatic cancer Neg Hx     Social History   Tobacco Use   Smoking status: Former    Packs/day: 1.00    Years: 5.00    Pack years: 5.00    Types: Cigarettes    Quit date: 11/03/1997    Years since quitting: 23.6   Smokeless tobacco: Never   Tobacco comments:    1 pack 1 week  Vaping Use   Vaping Use: Never used  Substance Use Topics   Alcohol use: No    Alcohol/week: 0.0 standard drinks   Drug use: No    Home Medications Prior to Admission medications   Medication Sig Start Date End Date Taking? Authorizing Provider  acarbose (PRECOSE) 50 MG tablet Take 1 tablet (50 mg total) by mouth 3 (three) times daily with meals. 05/30/21  Yes Renato Shin, MD  acetaminophen (TYLENOL) 500 MG tablet Take 1,000 mg by mouth daily as needed for moderate pain.   Yes [provider]  albuterol (VENTOLIN HFA) 108 (90 Base) MCG/ACT inhaler Inhale 2 puffs into the lungs every 6 (six) hours as needed. Patient taking differently: Inhale 2 puffs into the lungs every 6 (six) hours as needed for wheezing or shortness of breath. 12/13/19  Yes Julian Hy, DO  cetirizine (ZYRTEC) 10 MG tablet Take 10 mg by mouth daily.   Yes [provider]  Cholecalciferol (VITAMIN D3) 125 MCG (5000 UT) CAPS Take 1 capsule (5,000 Units total) by mouth daily. Patient taking differently: Take 10,000 Units by mouth daily. 10/13/19  Yes Renato Shin, MD  dicyclomine (BENTYL) 10 MG capsule Take 1 capsule (10 mg total) by mouth up to 3 times daily before meals and at bedtime for abdominal cramping or diarrhea.  Hold in the setting of constipation. 04/17/21  Yes Erenest Rasher, PA-C  EPINEPHrine 0.3 mg/0.3 mL IJ SOAJ injection Inject 0.3 mg into the muscle  as needed for anaphylaxis. 08/10/20  Yes [provider]  escitalopram (LEXAPRO) 20 MG tablet Take 1 tablet (20 mg total) by mouth at bedtime. 01/26/19  Yes Renato Shin, MD  fluticasone [facility]) 50 MCG/ACT nasal spray Place 2 sprays into both nostrils at bedtime.   Yes [provider]  HYDROcodone-acetaminophen (  NORCO/VICODIN) 5-325 MG tablet Take 1 tablet by mouth every 4 (four) hours as needed for moderate pain or severe pain. 06/11/21 06/11/22 Yes Vallarie Mare, MD  ipratropium (ATROVENT) 0.02 % nebulizer solution Take 0.5 mg by nebulization daily.   Yes [provider]  Magnesium 250 MG TABS Take 250 mg by mouth at bedtime.    Yes [provider]  NON FORMULARY at bedtime. CPAP at night   Yes [provider]  nystatin cream (MYCOSTATIN) Apply 1 application topically daily as needed (toe fungus). 03/15/21  Yes [provider]  ondansetron (ZOFRAN-ODT) 4 MG disintegrating tablet Take 4 mg by mouth every 8 (eight) hours as needed. 04/11/21  Yes [provider]  pantoprazole (PROTONIX) 40 MG tablet Take 1 tablet (40 mg total) by mouth 2 (two) times daily. 04/17/21  Yes Harper, Kristen S, PA-C  RESTASIS 0.05 % ophthalmic emulsion Place 1 drop into both eyes 2 (two) times daily. 12/26/19  Yes [provider]  Simethicone 250 MG CAPS Take 500 mg by mouth at bedtime.   Yes [provider]  sucralfate (CARAFATE) 1 g tablet Take 1 g by mouth daily. 08/20/20  Yes [provider]  SYNTHROID 150 MCG tablet Take 150 mcg by mouth daily. 04/06/21  Yes [provider]  glucose blood (ACCU-CHEK AVIVA PLUS) test strip 1 each by Other route daily. And lancets 1/day 05/30/21   Renato Shin, MD  LIDOCAINE-MENTHOL ROLL-ON EX Apply 1 application topically daily as needed (pain). Patient not taking: Reported on 07/03/2021    [provider]  terbinafine (LAMISIL) 250 MG tablet Take 250 mg by mouth daily. 03/15/21    [provider]    Allergies    Levofloxacin, Toradol [ketorolac tromethamine], Tramadol, Levomenol, Other, Chamomile, Hctz [hydrochlorothiazide], Lisinopril, Losartan potassium, Penicillins, and Triamcinolone acetonide  Review of Systems   Review of Systems  Constitutional:  Negative for chills and fever.  HENT:  Negative for ear pain and sore throat.   Eyes:  Negative for pain and visual disturbance.  Respiratory:  Negative for cough and shortness of breath.   Cardiovascular:  Positive for chest pain. Negative for palpitations.  Gastrointestinal:  Negative for abdominal pain and vomiting.  Genitourinary:  Negative for dysuria and hematuria.  Musculoskeletal:  Negative for arthralgias and back pain.  Skin:  Negative for color change and rash.  Neurological:  Positive for numbness. Negative for dizziness, tremors, seizures, syncope, facial asymmetry, speech difficulty, weakness and headaches.  All other systems reviewed and are negative.  Physical Exam Updated Vital Signs BP (!) 147/84   Pulse 90   Temp 99.4 F (37.4 C)   Resp 18   Ht 1.626 m (5\' 4" )   Wt 120.2 kg   LMP 10/09/2014   SpO2 99%   BMI 45.49 kg/m   Physical Exam Vitals and nursing note reviewed.  Constitutional:      General: She is not in acute distress.    Appearance: She is well-developed.  HENT:     Head: Normocephalic and atraumatic.     Comments: No facial spasm with tapping the facial nerve were comes out from the ear bilaterally.  No facial asymmetry. Eyes:     Extraocular Movements: Extraocular movements intact.     Conjunctiva/sclera: Conjunctivae normal.     Pupils: Pupils are equal, round, and reactive to light.  Cardiovascular:     Rate and Rhythm: Normal rate and regular rhythm.     Heart sounds: No murmur heard. Pulmonary:  Effort: Pulmonary effort is normal. No respiratory distress.     Breath sounds: Normal breath sounds.  Chest:     Chest wall: No tenderness.  Abdominal:      Palpations: Abdomen is soft.     Tenderness: There is no abdominal tenderness.  Musculoskeletal:     Cervical back: Normal range of motion and neck supple.     Comments: Patient's right hand has a splint on when she had carpal tunnel repair.  But with blood pressure cuff going up no spasm to the left hand.  Movement of all 4 extremities.  As mentioned in the facial exam there is no facial spasm with tapping of the facial nerve.  No facial asymmetry.  Skin:    General: Skin is warm and dry.  Neurological:     General: No focal deficit present.     Mental Status: She is alert and oriented to person, place, and time.     Cranial Nerves: No cranial nerve deficit.     Sensory: No sensory deficit.     Motor: No weakness.     Coordination: Coordination normal.    ED Results / Procedures / Treatments   Labs (all labs ordered are listed, but only abnormal results are displayed) Labs Reviewed  COMPREHENSIVE METABOLIC PANEL - Abnormal; Notable for the following components:      Result Value   Glucose, Bld 255 (*)    All other components within normal limits  CBC WITH DIFFERENTIAL/PLATELET  TSH  TROPONIN I (HIGH SENSITIVITY)    EKG None  Radiology DG Chest Port 1 View  Result Date: 07/03/2021 CLINICAL DATA:  Freestyle Libre device removed prior to imaging. Pt c/o cp onset since today. Denies sob, fever or N/V. HX of asthma and diabetes. EXAM: PORTABLE CHEST 1 VIEW COMPARISON:  06/22/2021. FINDINGS: Cardiac silhouette is normal in size and configuration. Normal mediastinal and hilar contours. Clear lungs.  No pleural effusion or pneumothorax. Skeletal structures are grossly intact. IMPRESSION: No active disease. Electronically Signed   By: Lajean Manes M.D.   On: 07/03/2021 15:57    Procedures Procedures   Medications Ordered in ED Medications  0.9 %  sodium chloride infusion (0 mLs Intravenous Stopped 07/03/21 1710)  sodium chloride 0.9 % bolus 500 mL (500 mLs Intravenous New  Bag/Given 07/03/21 1550)    ED Course  I have reviewed the triage vital signs and the nursing notes.  Pertinent labs & imaging results that were available during my care of the patient were reviewed by me and considered in my medical decision making (see chart for details).    MDM Rules/Calculators/A&P                           Patient's electrolytes here today are normal.  No leukocytosis.  Hemoglobin 13.0.  Blood sugar is elevated at 255.  But no evidence of any acidosis.  TSH is normal.  Liver function test are normal.  Calcium is 9.4.  Potassium normal to at 3.6.  Based on the facial numbness and no electrolyte abnormalities.  Will get MRI brain to rule out small stroke.  Also since patient's had chest discomfort.  Chest x-ray negative EKG without acute changes.  But we will go ahead and get troponins.  Delta troponins without any abnormalities.  We will have patient follow-up with her neurologist.  Patient stable for discharge home. Final Clinical Impression(s) / ED Diagnoses Final diagnoses:  Right facial numbness  Hyperglycemia    Rx / DC Orders ED Discharge Orders     None        Fredia Sorrow, MD 07/03/21 2026

## 2021-07-03 NOTE — ED Triage Notes (Signed)
Facial numbness that radiates across entire face that began this morning. Hx of same. Does not have thyroid and states hx of electrolyte imbalance.

## 2021-07-03 NOTE — Discharge Instructions (Addendum)
Work-up here today MRI without evidence of any stroke as explanation.  Calcium levels were normal electrolytes were normal.  Heart markers x2 were normal no evidence of any acute cardiac event.  Would recommend following up with your primary care doctor as well as your neurologist.

## 2021-07-08 ENCOUNTER — Emergency Department (HOSPITAL_COMMUNITY)
Admission: EM | Admit: 2021-07-08 | Discharge: 2021-07-08 | Disposition: A | Discharge status: Home / Self Care | Payer: Medicaid Other | Attending: Emergency Medicine | Admitting: Emergency Medicine

## 2021-07-08 ENCOUNTER — Other Ambulatory Visit: Payer: Self-pay

## 2021-07-08 ENCOUNTER — Encounter (HOSPITAL_COMMUNITY): Payer: Self-pay | Admitting: Emergency Medicine

## 2021-07-08 DIAGNOSIS — R251 Tremor, unspecified: Secondary | ICD-10-CM | POA: Diagnosis not present

## 2021-07-08 DIAGNOSIS — R110 Nausea: Secondary | ICD-10-CM

## 2021-07-08 DIAGNOSIS — Z79899 Other long term (current) drug therapy: Secondary | ICD-10-CM | POA: Diagnosis not present

## 2021-07-08 DIAGNOSIS — Z87891 Personal history of nicotine dependence: Secondary | ICD-10-CM | POA: Diagnosis not present

## 2021-07-08 DIAGNOSIS — E1165 Type 2 diabetes mellitus with hyperglycemia: Secondary | ICD-10-CM | POA: Diagnosis not present

## 2021-07-08 DIAGNOSIS — R531 Weakness: Secondary | ICD-10-CM | POA: Diagnosis not present

## 2021-07-08 DIAGNOSIS — E039 Hypothyroidism, unspecified: Secondary | ICD-10-CM | POA: Insufficient documentation

## 2021-07-08 DIAGNOSIS — R739 Hyperglycemia, unspecified: Secondary | ICD-10-CM

## 2021-07-08 LAB — CBC WITH DIFFERENTIAL/PLATELET
Abs Immature Granulocytes: 0.04 10*3/uL (ref 0.00–0.07)
Basophils Absolute: 0 10*3/uL (ref 0.0–0.1)
Basophils Relative: 0 %
Eosinophils Absolute: 0.1 10*3/uL (ref 0.0–0.5)
Eosinophils Relative: 1 %
HCT: 39.2 % (ref 36.0–46.0)
Hemoglobin: 12.8 g/dL (ref 12.0–15.0)
Immature Granulocytes: 1 %
Lymphocytes Relative: 19 %
Lymphs Abs: 1.6 10*3/uL (ref 0.7–4.0)
MCH: 28.6 pg (ref 26.0–34.0)
MCHC: 32.7 g/dL (ref 30.0–36.0)
MCV: 87.7 fL (ref 80.0–100.0)
Monocytes Absolute: 0.3 10*3/uL (ref 0.1–1.0)
Monocytes Relative: 4 %
Neutro Abs: 6.6 10*3/uL (ref 1.7–7.7)
Neutrophils Relative %: 75 %
Platelets: 295 10*3/uL (ref 150–400)
RBC: 4.47 MIL/uL (ref 3.87–5.11)
RDW: 13.9 % (ref 11.5–15.5)
WBC: 8.7 10*3/uL (ref 4.0–10.5)
nRBC: 0 % (ref 0.0–0.2)

## 2021-07-08 LAB — COMPREHENSIVE METABOLIC PANEL
ALT: 31 U/L (ref 0–44)
AST: 28 U/L (ref 15–41)
Albumin: 4 g/dL (ref 3.5–5.0)
Alkaline Phosphatase: 56 U/L (ref 38–126)
Anion gap: 8 (ref 5–15)
BUN: 10 mg/dL (ref 6–20)
CO2: 24 mmol/L (ref 22–32)
Calcium: 9.3 mg/dL (ref 8.9–10.3)
Chloride: 102 mmol/L (ref 98–111)
Creatinine, Ser: 0.67 mg/dL (ref 0.44–1.00)
GFR, Estimated: >60 mL/min (ref >60)
Glucose, Bld: 233 mg/dL — ABNORMAL HIGH (ref 70–99)
Potassium: 3.9 mmol/L (ref 3.5–5.1)
Sodium: 134 mmol/L — ABNORMAL LOW (ref 135–145)
Total Bilirubin: 0.3 mg/dL (ref 0.3–1.2)
Total Protein: 7.9 g/dL (ref 6.5–8.1)

## 2021-07-08 LAB — URINALYSIS, ROUTINE W REFLEX MICROSCOPIC
Bilirubin Urine: NEGATIVE
Glucose, UA: NEGATIVE mg/dL
Hgb urine dipstick: NEGATIVE
Ketones, ur: NEGATIVE mg/dL
Leukocytes,Ua: NEGATIVE
Nitrite: NEGATIVE
Protein, ur: NEGATIVE mg/dL
Specific Gravity, Urine: 1.013 (ref 1.005–1.030)
pH: 7 (ref 5.0–8.0)

## 2021-07-08 LAB — TSH: TSH: 1.143 u[IU]/mL (ref 0.350–4.500)

## 2021-07-08 LAB — CBG MONITORING, ED: Glucose-Capillary: 216 mg/dL — ABNORMAL HIGH (ref 70–99)

## 2021-07-08 MED ORDER — ONDANSETRON 4 MG PO TBDP: 4.0000 mg | ORAL_TABLET | Freq: Three times a day (TID) | ORAL | 0 refills | Status: DC | PRN

## 2021-07-08 NOTE — Discharge Instructions (Addendum)
Your labs today are reassuring.  Keep your appointment with Dr. Gerarda Fraction on Thursday.  As discussed will also be important for you to follow-up with your gynecologist as planned.  You have been prescribed some Zofran if your nausea returns you may take this medication.  Your blood sugar is elevated today but not dangerously.  Keep a close watch on your blood glucose levels and your food intake.

## 2021-07-08 NOTE — ED Provider Notes (Signed)
[facility] EMERGENCY DEPARTMENT Provider Note   CSN: [id number] Arrival date & time: 07/08/21  1117     History Chief Complaint  Patient presents with   Weakness    [facility] is a 45 y.o. female with a history as outlined below, most significant for type 2 diabetes, GERD, Graves' disease resulting in hypothyroidism on chronic Synthroid, also has history of anemia, asthma, anxiety and depression who woke today feeling generalized weakness along with tremors and nausea without emesis.  She immediately checked her blood sugar which was 205.  She states that when she has the symptoms is usually her thyroid which is "off".  She does endorse having lots of personal stressors at home and when she has anxiety her diabetes and her thyroid disease get out of control.  She was seen here 5 days ago at that time had complaints of right-sided facial numbness and vague chest discomfort.  Her work-up at that time was stable and she denies similar symptoms today.  She denies fevers or chills, headache, shortness of breath, denies abdominal pain nausea or vomiting, dysuria.  She ate a fast food meal prior to arrival here.  The history is provided by the patient.      Past Medical History:  Diagnosis Date   Allergic asthma    Anemia    Anxiety    Arthritis    Depression    Diabetes mellitus without complication ([place])    Diverticulosis    Fatty liver    moderate to severe   GERD (gastroesophageal reflux disease)    Graves disease    Headache    tension, ocular migraines   Hypothyroidism    Internal hemorrhoids    Pneumonia    as an infant   Sleep apnea    Thyroid disease    Uterine fibroid     Patient Active Problem List   Diagnosis Date Noted   Diabetes ([place]) 05/30/2021   Irritable bowel syndrome 04/17/2021   Weight gain 12/20/2020   Diarrhea 05/08/2020   Idiopathic peripheral neuropathy 04/18/2020   Myelopathy ([place]) 04/18/2020   OSA on CPAP 04/18/2020   RLS (restless legs  syndrome) 04/18/2020   Sensory disturbance 01/20/2020   Atypical chest pain 01/20/2020   Obesity, Class III, BMI 40-49.9 (morbid obesity) ([place]) 01/20/2020   Weakness 01/19/2020   Hyperglycemia 10/04/2019   Myalgia 09/05/2019   Vitamin B 12 deficiency 07/28/2019   Right sided numbness 06/29/2019   Constipation 06/15/2019   Elevated lipase 06/15/2019   Hypomagnesemia 03/29/2019   Leukocytosis 03/29/2019   Pain of upper abdomen 02/10/2019   Nausea without vomiting 02/10/2019   Tingling of face 11/30/2018   Hot flashes 11/30/2018   Vitamin D deficiency 10/06/2018   GERD (gastroesophageal reflux disease) 01/28/2017   Rectal bleeding 01/28/2017   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   ABDOMINAL HYSTERECTOMY     CARPAL TUNNEL RELEASE Right 06/11/2021   Procedure: Right Carpel tunnel release;  Surgeon: Vallarie Mare, MD;  Location: [facility];  Service: Neurosurgery;  Laterality: Right;  RM 18   CHOLECYSTECTOMY N/A 11/06/2014   Procedure: LAPAROSCOPIC CHOLECYSTECTOMY;  Surgeon: Jamesetta So, MD;  Location: [facility];  Service: General;  Laterality: N/A;   COLONOSCOPY  N/A 03/04/2017   Dr. Gala Romney: Hemorrhoids, diverticulosis, benign polyp without adenomatous changes.  Next colonoscopy 10 years   DILATION AND CURETTAGE OF UTERUS     ESOPHAGOGASTRODUODENOSCOPY N/A 10/02/2014   Dr. Gala Romney: fundic gland polyps, hiatal hernia   ESOPHAGOGASTRODUODENOSCOPY  07/2020   [facility] ; normal esophagus biopsied, benign-appearing gastric polyps biopsied, gastric biopsies obtained to evaluate for H. pylori, normal duodenum.  Pathology with mild chronic gastritis, negative for H. pylori, fundic gland polyps, esophageal biopsy benign.   MOUTH SURGERY      extraction of teeth   POLYPECTOMY  03/04/2017   Procedure: POLYPECTOMY;  Surgeon: Daneil Dolin, MD;  Location: [facility];  Service: Endoscopy;;  colon   THYROIDECTOMY N/A 09/30/2018   Procedure: TOTAL THYROIDECTOMY;  Surgeon: Armandina Gemma, MD;  Location: [facility];  Service: General;  Laterality: N/A;   TUBAL LIGATION       OB History     Gravida  2   Para  2   Term  2   Preterm      AB      Living  2      SAB      IAB      Ectopic      Multiple      Live Births              Family History  Problem Relation Age of Onset   Uterine cancer Mother        ?cervical cancer   Hypertension Father    Diabetes Father    Uterine cancer Sister        suicide   Colon cancer Paternal Aunt 37   Depression Other    Thyroid disease Neg Hx    Pancreatic cancer Neg Hx     Social History   Tobacco Use   Smoking status: Former    Packs/day: 1.00    Years: 5.00    Pack years: 5.00    Types: Cigarettes    Quit date: 11/03/1997    Years since quitting: 23.6   Smokeless tobacco: Never   Tobacco comments:    1 pack 1 week  Vaping Use   Vaping Use: Never used  Substance Use Topics   Alcohol use: No    Alcohol/week: 0.0 standard drinks   Drug use: No    Home Medications Prior to Admission medications   Medication Sig Start Date End Date Taking? Authorizing Provider  ondansetron (ZOFRAN ODT) 4 MG disintegrating tablet Take 1 tablet (4 mg total) by mouth every 8 (eight) hours as needed for nausea or vomiting. 07/08/21  Yes Gayleen Sholtz, Almyra Free, PA-C  acarbose (PRECOSE) 50 MG tablet Take 1 tablet (50 mg total) by mouth 3 (three) times daily with meals. 05/30/21   Renato Shin, MD  acetaminophen (TYLENOL) 500 MG tablet Take 1,000 mg by mouth daily as needed for moderate pain.    [provider]  albuterol (VENTOLIN HFA) 108 (90 Base) MCG/ACT inhaler Inhale 2 puffs into the lungs every 6 (six) hours as needed. Patient taking differently: Inhale 2 puffs into the lungs  every 6 (six) hours as needed for wheezing or shortness of breath. 12/13/19   Julian Hy, DO  cetirizine (ZYRTEC) 10 MG tablet Take 10 mg by mouth daily.    [provider]  Cholecalciferol (VITAMIN D3) 125 MCG (5000 UT) CAPS Take 1 capsule (5,000 Units total) by mouth daily. Patient taking differently: Take 10,000 Units by mouth daily. 10/13/19  Renato Shin, MD  dicyclomine (BENTYL) 10 MG capsule Take 1 capsule (10 mg total) by mouth up to 3 times daily before meals and at bedtime for abdominal cramping or diarrhea.  Hold in the setting of constipation. 04/17/21   Erenest Rasher, PA-C  EPINEPHrine 0.3 mg/0.3 mL IJ SOAJ injection Inject 0.3 mg into the muscle as needed for anaphylaxis. 08/10/20   [provider]  escitalopram (LEXAPRO) 20 MG tablet Take 1 tablet (20 mg total) by mouth at bedtime. 01/26/19   Renato Shin, MD  fluticasone (FLONASE) 50 MCG/ACT nasal spray Place 2 sprays into both nostrils at bedtime.    [provider]  glucose blood (ACCU-CHEK AVIVA PLUS) test strip 1 each by Other route daily. And lancets 1/day 05/30/21   Renato Shin, MD  HYDROcodone-acetaminophen (NORCO/VICODIN) 5-325 MG tablet Take 1 tablet by mouth every 4 (four) hours as needed for moderate pain or severe pain. 06/11/21 06/11/22  Vallarie Mare, MD  ipratropium (ATROVENT) 0.02 % nebulizer solution Take 0.5 mg by nebulization daily.    [provider]  LIDOCAINE-MENTHOL ROLL-ON EX Apply 1 application topically daily as needed (pain). Patient not taking: Reported on 07/03/2021    [provider]  Magnesium 250 MG TABS Take 250 mg by mouth at bedtime.     [provider]  NON FORMULARY at bedtime. CPAP at night    [provider]  nystatin cream (MYCOSTATIN) Apply 1 application topically daily as needed (toe fungus). 03/15/21   [provider]  pantoprazole (PROTONIX) 40 MG tablet Take 1 tablet (40 mg total) by mouth 2 (two) times daily.  04/17/21   Erenest Rasher, PA-C  RESTASIS 0.05 % ophthalmic emulsion Place 1 drop into both eyes 2 (two) times daily. 12/26/19   [provider]  Simethicone 250 MG CAPS Take 500 mg by mouth at bedtime.    [provider]  sucralfate (CARAFATE) 1 g tablet Take 1 g by mouth daily. 08/20/20   [provider]  SYNTHROID 150 MCG tablet Take 150 mcg by mouth daily. 04/06/21   [provider]  terbinafine (LAMISIL) 250 MG tablet Take 250 mg by mouth daily. 03/15/21   [provider]    Allergies    Levofloxacin, Toradol [ketorolac tromethamine], Tramadol, Levomenol, Other, Chamomile, Hctz [hydrochlorothiazide], Lisinopril, Losartan potassium, Penicillins, and Triamcinolone acetonide  Review of Systems   Review of Systems  Constitutional:  Negative for fever.  HENT:  Negative for congestion and sore throat.   Eyes: Negative.   Respiratory:  Negative for chest tightness and shortness of breath.   Cardiovascular:  Negative for chest pain.  Gastrointestinal:  Positive for nausea. Negative for abdominal pain.  Genitourinary: Negative.   Musculoskeletal:  Negative for arthralgias, joint swelling and neck pain.  Skin: Negative.  Negative for rash and wound.  Neurological:  Positive for tremors and weakness. Negative for dizziness, light-headedness, numbness and headaches.  All other systems reviewed and are negative.  Physical Exam Updated Vital Signs BP (!) 152/100 (BP Location: Left Arm)   Pulse 80   Temp 98.4 F (36.9 C) (Oral)   Resp 18   Ht 5\' 4"  (1.626 m)   Wt 120.2 kg   LMP 10/09/2014   SpO2 99%   BMI 45.49 kg/m   Physical Exam Vitals and nursing note reviewed.  Constitutional:      Appearance: She is well-developed.  HENT:     Head: Normocephalic and atraumatic.  Eyes:     Conjunctiva/sclera:  Conjunctivae normal.  Cardiovascular:     Rate and Rhythm: Normal rate and regular rhythm.     Heart sounds: Normal heart sounds.   Pulmonary:     Effort: Pulmonary effort is normal.     Breath sounds: Normal breath sounds. No wheezing.  Abdominal:     General: Bowel sounds are normal.     Palpations: Abdomen is soft.     Tenderness: There is no abdominal tenderness.  Musculoskeletal:        General: Normal range of motion.     Cervical back: Normal range of motion.  Skin:    General: Skin is warm and dry.  Neurological:     Mental Status: She is alert.    ED Results / Procedures / Treatments   Labs (all labs ordered are listed, but only abnormal results are displayed) Labs Reviewed  COMPREHENSIVE METABOLIC PANEL - Abnormal; Notable for the following components:      Result Value   Sodium 134 (*)    Glucose, Bld 233 (*)    All other components within normal limits  URINALYSIS, ROUTINE W REFLEX MICROSCOPIC - Abnormal; Notable for the following components:   Color, Urine STRAW (*)    All other components within normal limits  CBG MONITORING, ED - Abnormal; Notable for the following components:   Glucose-Capillary 216 (*)    All other components within normal limits  CBC WITH DIFFERENTIAL/PLATELET  TSH  CBG MONITORING, ED    EKG None  Radiology No results found.  Procedures Procedures   Medications Ordered in ED Medications - No data to display  ED Course  I have reviewed the triage vital signs and the nursing notes.  Pertinent labs & imaging results that were available during my care of the patient were reviewed by me and considered in my medical decision making (see chart for details).    MDM Rules/Calculators/A&P                           's labs and exam are reassuring today.  She does have hyperglycemia but she is not in DKA.  She was encouraged to keep a close watch on her food intake and her CBGs.  She does have an appointment with Dr. Gerarda Fraction in 3 days.  She was encouraged to keep this appointment.  She also has an appointment with her gynecologist in [place] as she mentions at time of  discharge that a right ovarian cyst was found on a recent MRI and speculates that this may be causing problems with her "hormones".  She states her PCP checked her female hormones and all was normal except her testosterone was low.  It is possible she starting to have some perimenopausal symptoms..  Deferred to her gynecologist for follow-up care. Final Clinical Impression(s) / ED Diagnoses Final diagnoses:  Weakness  Nausea  Hyperglycemia    Rx / DC Orders ED Discharge Orders          Ordered    ondansetron (ZOFRAN ODT) 4 MG disintegrating tablet  Every 8 hours PRN        07/08/21 1653             Evalee Jefferson, PA-C 07/08/21 1656    Hayden Rasmussen, MD 07/08/21 2141

## 2021-07-08 NOTE — ED Triage Notes (Signed)
Pt arrives to ED d/t weakness/shakiness. Pt states she woke up this AM nauseous, states blood sugar 205. Pt does not have thyroid, worried about lab imbalance. Pt states seen recently for the same.

## 2021-07-30 HISTORY — PX: RIGHT OOPHORECTOMY: SHX2359

## 2021-08-01 ENCOUNTER — Encounter (HOSPITAL_COMMUNITY): Payer: Self-pay | Admitting: Emergency Medicine

## 2021-08-01 ENCOUNTER — Other Ambulatory Visit: Payer: Self-pay

## 2021-08-01 ENCOUNTER — Emergency Department (HOSPITAL_COMMUNITY): Payer: Medicaid Other

## 2021-08-01 ENCOUNTER — Emergency Department (HOSPITAL_COMMUNITY)
Admission: EM | Admit: 2021-08-01 | Discharge: 2021-08-01 | Disposition: A | Discharge status: Home / Self Care | Payer: Medicaid Other | Attending: Emergency Medicine | Admitting: Emergency Medicine

## 2021-08-01 DIAGNOSIS — R0981 Nasal congestion: Secondary | ICD-10-CM | POA: Insufficient documentation

## 2021-08-01 DIAGNOSIS — J069 Acute upper respiratory infection, unspecified: Secondary | ICD-10-CM

## 2021-08-01 DIAGNOSIS — J45909 Unspecified asthma, uncomplicated: Secondary | ICD-10-CM | POA: Diagnosis not present

## 2021-08-01 DIAGNOSIS — Z87891 Personal history of nicotine dependence: Secondary | ICD-10-CM | POA: Insufficient documentation

## 2021-08-01 DIAGNOSIS — E039 Hypothyroidism, unspecified: Secondary | ICD-10-CM | POA: Insufficient documentation

## 2021-08-01 DIAGNOSIS — Z20822 Contact with and (suspected) exposure to covid-19: Secondary | ICD-10-CM | POA: Insufficient documentation

## 2021-08-01 DIAGNOSIS — Z7951 Long term (current) use of inhaled steroids: Secondary | ICD-10-CM | POA: Diagnosis not present

## 2021-08-01 DIAGNOSIS — E1142 Type 2 diabetes mellitus with diabetic polyneuropathy: Secondary | ICD-10-CM | POA: Diagnosis not present

## 2021-08-01 DIAGNOSIS — D72829 Elevated white blood cell count, unspecified: Secondary | ICD-10-CM | POA: Diagnosis not present

## 2021-08-01 DIAGNOSIS — R509 Fever, unspecified: Secondary | ICD-10-CM | POA: Insufficient documentation

## 2021-08-01 DIAGNOSIS — R0989 Other specified symptoms and signs involving the circulatory and respiratory systems: Secondary | ICD-10-CM

## 2021-08-01 DIAGNOSIS — Z79899 Other long term (current) drug therapy: Secondary | ICD-10-CM | POA: Insufficient documentation

## 2021-08-01 DIAGNOSIS — M791 Myalgia, unspecified site: Secondary | ICD-10-CM | POA: Insufficient documentation

## 2021-08-01 LAB — CBC WITH DIFFERENTIAL/PLATELET
Abs Immature Granulocytes: 0.03 10*3/uL (ref 0.00–0.07)
Basophils Absolute: 0.1 10*3/uL (ref 0.0–0.1)
Basophils Relative: 0 %
Eosinophils Absolute: 0.1 10*3/uL (ref 0.0–0.5)
Eosinophils Relative: 1 %
HCT: 37.9 % (ref 36.0–46.0)
Hemoglobin: 12.9 g/dL (ref 12.0–15.0)
Immature Granulocytes: 0 %
Lymphocytes Relative: 17 %
Lymphs Abs: 1.9 10*3/uL (ref 0.7–4.0)
MCH: 29.2 pg (ref 26.0–34.0)
MCHC: 34 g/dL (ref 30.0–36.0)
MCV: 85.7 fL (ref 80.0–100.0)
Monocytes Absolute: 0.5 10*3/uL (ref 0.1–1.0)
Monocytes Relative: 5 %
Neutro Abs: 8.6 10*3/uL — ABNORMAL HIGH (ref 1.7–7.7)
Neutrophils Relative %: 77 %
Platelets: 298 10*3/uL (ref 150–400)
RBC: 4.42 MIL/uL (ref 3.87–5.11)
RDW: 14 % (ref 11.5–15.5)
WBC: 11.2 10*3/uL — ABNORMAL HIGH (ref 4.0–10.5)
nRBC: 0 % (ref 0.0–0.2)

## 2021-08-01 LAB — BASIC METABOLIC PANEL
Anion gap: 9 (ref 5–15)
BUN: 9 mg/dL (ref 6–20)
CO2: 24 mmol/L (ref 22–32)
Calcium: 9.6 mg/dL (ref 8.9–10.3)
Chloride: 103 mmol/L (ref 98–111)
Creatinine, Ser: 0.71 mg/dL (ref 0.44–1.00)
GFR, Estimated: >60 mL/min (ref >60)
Glucose, Bld: 133 mg/dL — ABNORMAL HIGH (ref 70–99)
Potassium: 3.7 mmol/L (ref 3.5–5.1)
Sodium: 136 mmol/L (ref 135–145)

## 2021-08-01 LAB — RESP PANEL BY RT-PCR (FLU A&B, COVID) ARPGX2
Influenza A by PCR: NEGATIVE
Influenza B by PCR: NEGATIVE
SARS Coronavirus 2 by RT PCR: NEGATIVE

## 2021-08-01 IMAGING — DX DG CHEST 1V PORT
1 series · 1 of 1 positions shown · non-contrast
Comparison: Chest radiograph [DATE]

CLINICAL DATA: Chest pain.

EXAM:
PORTABLE CHEST 1 VIEW

[chest ap]
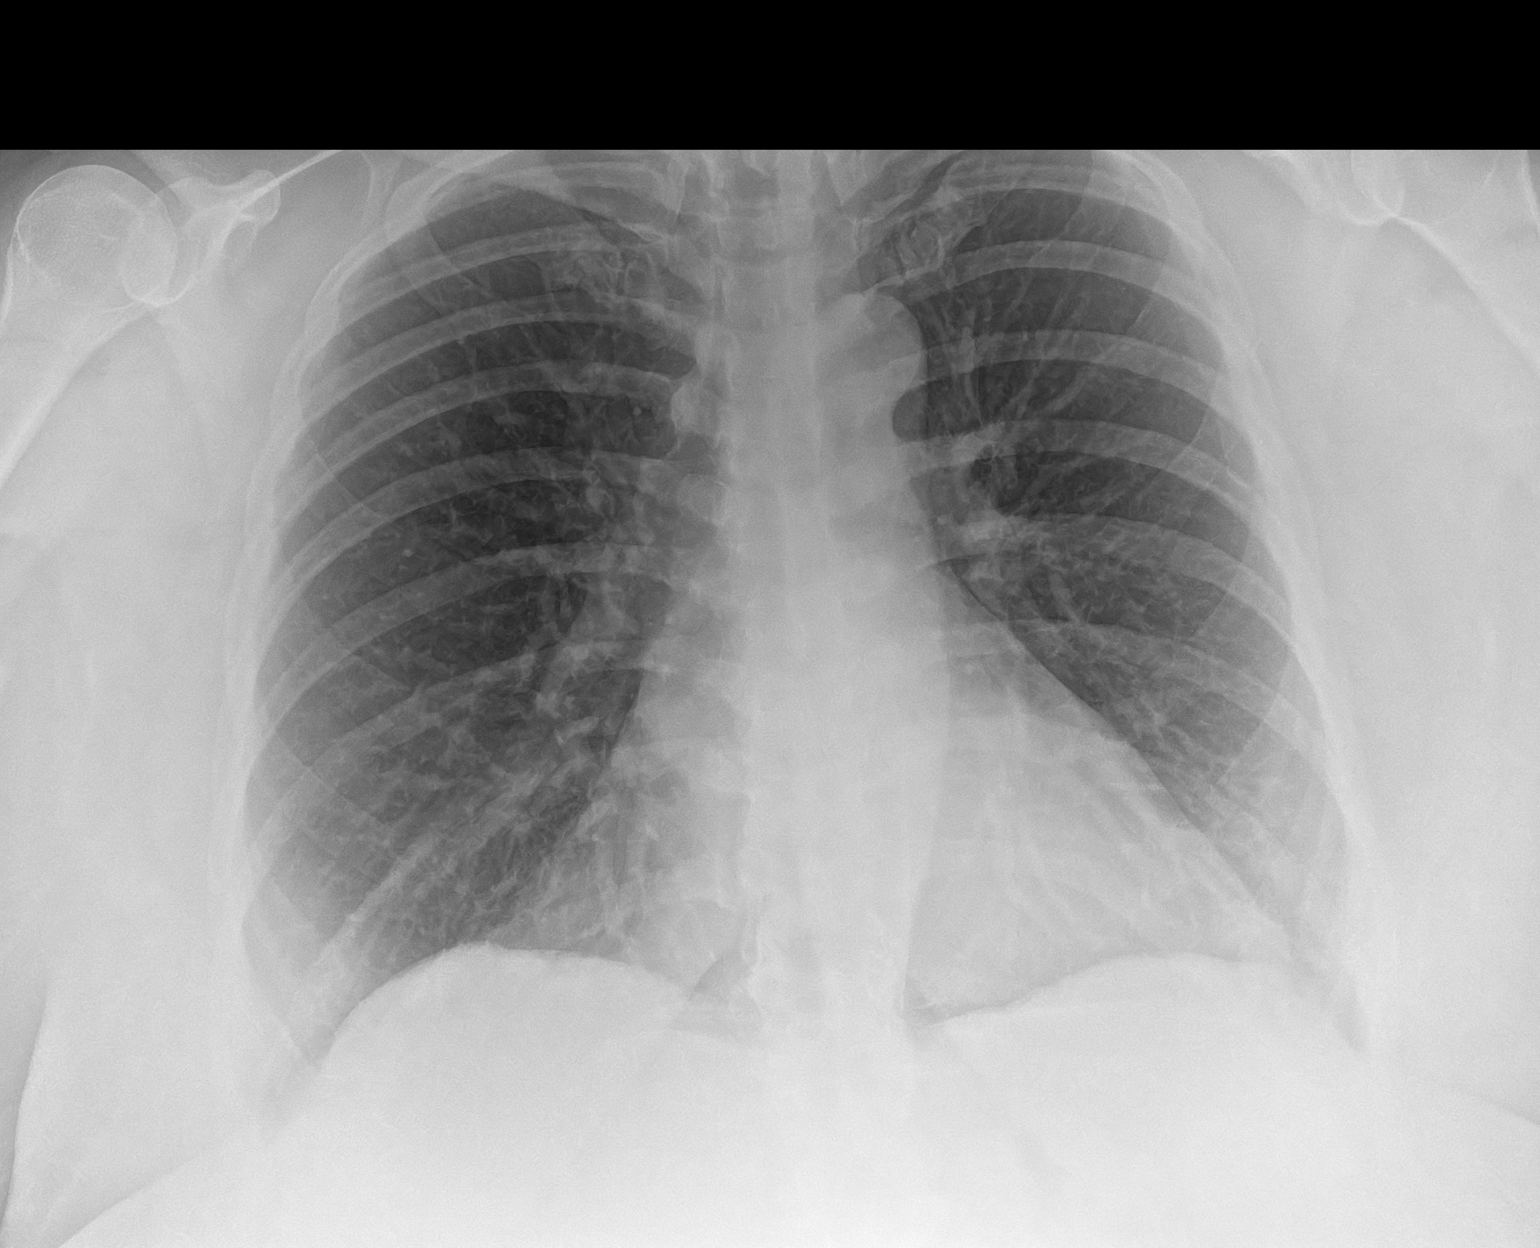

[1 of 1 positions shown; findings below may reference images not displayed]

FINDINGS: Stable cardiomediastinal contours. Minimal mild opacity the left
lung base are nonspecific and could reflect atelectasis. No
pneumothorax or large pleural effusion. No acute finding in the
visualized skeleton.
IMPRESSION: Minimal mild opacities the left lung base are nonspecific, could
reflect atelectasis, infection not excluded.

## 2021-08-01 NOTE — ED Triage Notes (Signed)
Patient reports feeling sick past two days. C/o night sweats, chills, stiff neck and runny nose. Reports feeling "off". States BPs and blood sugars have been good today.

## 2021-08-01 NOTE — ED Provider Notes (Signed)
[facility] EMERGENCY DEPARTMENT Provider Note   CSN: [id number] Arrival date & time: 08/01/21  1907     History Chief Complaint  Patient presents with   Nausea    Heather Fry is a 45 y.o. female.  HPI  Patient with significant medical history of diabetes, hypothyroidism presents with chief complaint of not feeling well.  Patient states that she has been having subjective fevers and chills, general body aches, slight nasal congestion.  She also states that she has been having some hot flashes, states that this been going on for proxy 1 months time, wake up in the morning covered in sweat.   She endorses she is vaccine against COVID but has not had her flu vaccine, she states that members have been sick.  She is not immunocompromise, she states she is tolerant p.o., she denies shortness of breath, stomach pain, nausea, vomit, diarrhea, peripheral edema.  She does state that she had a quick incident of chest pain, states it was in the middle of her chest, did not radiate, came on suddenly, lasted for few seconds and went away as well, has no other complaints.  Denies alleviating or arriving factors. Past Medical History:  Diagnosis Date   Allergic asthma    Anemia    Anxiety    Arthritis    Depression    Diabetes mellitus without complication ([place])    Diverticulosis    Fatty liver    moderate to severe   GERD (gastroesophageal reflux disease)    Graves disease    Headache    tension, ocular migraines   Hypothyroidism    Internal hemorrhoids    Pneumonia    as an infant   Sleep apnea    Thyroid disease    Uterine fibroid     Patient Active Problem List   Diagnosis Date Noted   Diabetes ([place]) 05/30/2021   Irritable bowel syndrome 04/17/2021   Weight gain 12/20/2020   Diarrhea 05/08/2020   Idiopathic peripheral neuropathy 04/18/2020   Myelopathy ([place]) 04/18/2020   OSA on CPAP 04/18/2020   RLS (restless legs syndrome) 04/18/2020   Sensory disturbance 01/20/2020    Atypical chest pain 01/20/2020   Obesity, Class III, BMI 40-49.9 (morbid obesity) ([place]) 01/20/2020   Weakness 01/19/2020   Hyperglycemia 10/04/2019   Myalgia 09/05/2019   Vitamin B 12 deficiency 07/28/2019   Right sided numbness 06/29/2019   Constipation 06/15/2019   Elevated lipase 06/15/2019   Hypomagnesemia 03/29/2019   Leukocytosis 03/29/2019   Pain of upper abdomen 02/10/2019   Nausea without vomiting 02/10/2019   Tingling of face 11/30/2018   Hot flashes 11/30/2018   Vitamin D deficiency 10/06/2018   GERD (gastroesophageal reflux disease) 01/28/2017   Rectal bleeding 01/28/2017   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   ABDOMINAL HYSTERECTOMY     CARPAL TUNNEL RELEASE Right 06/11/2021   Procedure: Right Carpel tunnel release;  Surgeon: Vallarie Mare, MD;  Location: [facility];  Service: Neurosurgery;  Laterality: Right;  RM 18   CHOLECYSTECTOMY N/A 11/06/2014   Procedure: LAPAROSCOPIC CHOLECYSTECTOMY;  Surgeon: Jamesetta So, MD;  Location: [facility];  Service: General;  Laterality: N/A;   COLONOSCOPY N/A 03/04/2017   Dr. Gala Romney: Hemorrhoids, diverticulosis, benign polyp  without adenomatous changes.  Next colonoscopy 10 years   DILATION AND CURETTAGE OF UTERUS     ESOPHAGOGASTRODUODENOSCOPY N/A 10/02/2014   Dr. Gala Romney: fundic gland polyps, hiatal hernia   ESOPHAGOGASTRODUODENOSCOPY  07/2020   [facility] ; normal esophagus biopsied, benign-appearing gastric polyps biopsied, gastric biopsies obtained to evaluate for H. pylori, normal duodenum.  Pathology with mild chronic gastritis, negative for H. pylori, fundic gland polyps, esophageal biopsy benign.   MOUTH SURGERY     extraction of teeth   POLYPECTOMY  03/04/2017    Procedure: POLYPECTOMY;  Surgeon: Daneil Dolin, MD;  Location: [facility];  Service: Endoscopy;;  colon   THYROIDECTOMY N/A 09/30/2018   Procedure: TOTAL THYROIDECTOMY;  Surgeon: Armandina Gemma, MD;  Location: [facility];  Service: General;  Laterality: N/A;   TUBAL LIGATION       OB History     Gravida  2   Para  2   Term  2   Preterm      AB      Living  2      SAB      IAB      Ectopic      Multiple      Live Births              Family History  Problem Relation Age of Onset   Uterine cancer Mother        ?cervical cancer   Hypertension Father    Diabetes Father    Uterine cancer Sister        suicide   Colon cancer Paternal Aunt 10   Depression Other    Thyroid disease Neg Hx    Pancreatic cancer Neg Hx     Social History   Tobacco Use   Smoking status: Former    Packs/day: 1.00    Years: 5.00    Pack years: 5.00    Types: Cigarettes    Quit date: 11/03/1997    Years since quitting: 23.7   Smokeless tobacco: Never   Tobacco comments:    1 pack 1 week  Vaping Use   Vaping Use: Never used  Substance Use Topics   Alcohol use: No    Alcohol/week: 0.0 standard drinks   Drug use: No    Home Medications Prior to Admission medications   Medication Sig Start Date End Date Taking? Authorizing Provider  acarbose (PRECOSE) 50 MG tablet Take 1 tablet (50 mg total) by mouth 3 (three) times daily with meals. 05/30/21   Renato Shin, MD  acetaminophen (TYLENOL) 500 MG tablet Take 1,000 mg by mouth daily as needed for moderate pain.    [provider]  albuterol (VENTOLIN HFA) 108 (90 Base) MCG/ACT inhaler Inhale 2 puffs into the lungs every 6 (six) hours as needed. Patient taking differently: Inhale 2 puffs into the lungs every 6 (six) hours as needed for wheezing or shortness of breath. 12/13/19   Julian Hy, DO  cetirizine (ZYRTEC) 10 MG tablet Take 10 mg by mouth daily.    [provider]  Cholecalciferol (VITAMIN D3) 125 MCG  (5000 UT) CAPS Take 1 capsule (5,000 Units total) by mouth daily. Patient taking differently: Take 10,000 Units by mouth daily. 10/13/19   Renato Shin, MD  dicyclomine (BENTYL) 10 MG capsule Take 1 capsule (10 mg total) by mouth up to 3 times daily before meals and at bedtime for abdominal cramping or diarrhea.  Hold in the setting of constipation. 04/17/21  Erenest Rasher, PA-C  EPINEPHrine 0.3 mg/0.3 mL IJ SOAJ injection Inject 0.3 mg into the muscle as needed for anaphylaxis. 08/10/20   [provider]  escitalopram (LEXAPRO) 20 MG tablet Take 1 tablet (20 mg total) by mouth at bedtime. 01/26/19   Renato Shin, MD  fluticasone (FLONASE) 50 MCG/ACT nasal spray Place 2 sprays into both nostrils at bedtime.    [provider]  glucose blood (ACCU-CHEK AVIVA PLUS) test strip 1 each by Other route daily. And lancets 1/day 05/30/21   Renato Shin, MD  HYDROcodone-acetaminophen (NORCO/VICODIN) 5-325 MG tablet Take 1 tablet by mouth every 4 (four) hours as needed for moderate pain or severe pain. 06/11/21 06/11/22  Vallarie Mare, MD  ipratropium (ATROVENT) 0.02 % nebulizer solution Take 0.5 mg by nebulization daily.    [provider]  LIDOCAINE-MENTHOL ROLL-ON EX Apply 1 application topically daily as needed (pain). Patient not taking: Reported on 07/03/2021    [provider]  Magnesium 250 MG TABS Take 250 mg by mouth at bedtime.     [provider]  NON FORMULARY at bedtime. CPAP at night    [provider]  nystatin cream (MYCOSTATIN) Apply 1 application topically daily as needed (toe fungus). 03/15/21   [provider]  ondansetron (ZOFRAN ODT) 4 MG disintegrating tablet Take 1 tablet (4 mg total) by mouth every 8 (eight) hours as needed for nausea or vomiting. 07/08/21   Evalee Jefferson, PA-C  pantoprazole (PROTONIX) 40 MG tablet Take 1 tablet (40 mg total) by mouth 2 (two) times daily. 04/17/21   Erenest Rasher, PA-C  RESTASIS 0.05 %  ophthalmic emulsion Place 1 drop into both eyes 2 (two) times daily. 12/26/19   [provider]  Simethicone 250 MG CAPS Take 500 mg by mouth at bedtime.    [provider]  sucralfate (CARAFATE) 1 g tablet Take 1 g by mouth daily. 08/20/20   [provider]  SYNTHROID 150 MCG tablet Take 150 mcg by mouth daily. 04/06/21   [provider]  terbinafine (LAMISIL) 250 MG tablet Take 250 mg by mouth daily. 03/15/21   [provider]    Allergies    Levofloxacin, Toradol [ketorolac tromethamine], Tramadol, Levomenol, Other, Chamomile, Hctz [hydrochlorothiazide], Lisinopril, Losartan potassium, Penicillins, and Triamcinolone acetonide  Review of Systems   Review of Systems  Constitutional:  Positive for chills and fever.  HENT:  Negative for congestion.   Respiratory:  Negative for shortness of breath.   Cardiovascular:  Negative for chest pain.  Gastrointestinal:  Negative for abdominal pain, diarrhea, nausea and vomiting.  Genitourinary:  Negative for enuresis.  Musculoskeletal:  Positive for myalgias. Negative for back pain.  Skin:  Negative for rash.  Neurological:  Negative for dizziness and headaches.  Hematological:  Does not bruise/bleed easily.   Physical Exam Updated Vital Signs BP (!) 146/86 (BP Location: Left Arm)   Pulse 94   Temp 98.3 F (36.8 C) (Oral)   Resp 17   Ht 5\' 4"  (1.626 m)   Wt 120.2 kg   LMP 10/09/2014   SpO2 99%   BMI 45.49 kg/m   Physical Exam Vitals and nursing note reviewed.  Constitutional:      General: She is not in acute distress.    Appearance: She is not ill-appearing.  HENT:     Head: Normocephalic and atraumatic.     Right Ear: Tympanic membrane, ear canal and external ear normal.     Left Ear: Tympanic membrane,  ear canal and external ear normal.     Nose: No congestion.     Comments: Bilateral erythematous turbinates.    Mouth/Throat:     Mouth: Mucous membranes are moist.     Pharynx:  Oropharynx is clear. No oropharyngeal exudate or posterior oropharyngeal erythema.  Eyes:     Conjunctiva/sclera: Conjunctivae normal.  Cardiovascular:     Rate and Rhythm: Normal rate and regular rhythm.     Pulses: Normal pulses.     Heart sounds: No murmur heard.   No friction rub. No gallop.  Pulmonary:     Effort: No respiratory distress.     Breath sounds: No wheezing, rhonchi or rales.  Abdominal:     Palpations: Abdomen is soft.     Tenderness: There is no abdominal tenderness. There is no right CVA tenderness or left CVA tenderness.  Musculoskeletal:     Right lower leg: No edema.     Left lower leg: No edema.  Skin:    General: Skin is warm and dry.  Neurological:     Mental Status: She is alert.  Psychiatric:        Mood and Affect: Mood normal.    ED Results / Procedures / Treatments   Labs (all labs ordered are listed, but only abnormal results are displayed) Labs Reviewed  BASIC METABOLIC PANEL - Abnormal; Notable for the following components:      Result Value   Glucose, Bld 133 (*)    All other components within normal limits  CBC WITH DIFFERENTIAL/PLATELET - Abnormal; Notable for the following components:   WBC 11.2 (*)    Neutro Abs 8.6 (*)    All other components within normal limits  RESP PANEL BY RT-PCR (FLU A&B, COVID) [id number]    EKG None  Radiology DG Chest Port 1 View  Result Date: 08/01/2021 CLINICAL DATA:  Chest pain. EXAM: PORTABLE CHEST 1 VIEW COMPARISON:  Chest radiograph 07/03/2021 FINDINGS: Stable cardiomediastinal contours. Minimal mild opacity the left lung base are nonspecific and could reflect atelectasis. No pneumothorax or large pleural effusion. No acute finding in the visualized skeleton. IMPRESSION: Minimal mild opacities the left lung base are nonspecific, could reflect atelectasis, infection not excluded. Electronically Signed   By: Audie Pinto M.D.   On: 08/01/2021 21:22    Procedures Procedures   Medications Ordered  in ED Medications - No data to display  ED Course  I have reviewed the triage vital signs and the nursing notes.  Pertinent labs & imaging results that were available during my care of the patient were reviewed by me and considered in my medical decision making (see chart for details).  Clinical Course as of 08/01/21 2205  Thu Aug 01, 2021  2156 Lymphocyte #: [id number] [WF]    Clinical Course User Index [WF] Marcello Fennel, PA-C   MDM Rules/Calculators/A&P                          Initial impression-presented with URI-like symptoms.  She is alert, does not appear acute stress, vital signs reassuring.  Patient was concern for electrolyte abnormalities will obtain basic lab work-up, chest x-ray for single ends of chest pain EKG reassess.  Work-up-CBC shows slight leukocytosis 11.2, BMP is unremarkable, respiratory panel unremarkable.  Chest x-ray unremarkable, respiratory panel pending at this time, EKG sinus without signs of ischemia.  Rule out-I have low suspicion for ACS patient has no cardiac history, presentation atypical etiology, EKG sinus  without signs of ischemia.Low suspicion for systemic infection as patient is nontoxic-appearing, vital signs reassuring, no obvious source infection noted on exam.  Low suspicion for pneumonia as lung sounds are clear bilaterally, x-ray did not reveal any acute findings.  I have low suspicion for PE as patient denies pleuritic chest pain, shortness of breath, patient is PERC. low suspicion for strep throat as oropharynx was visualized, no erythema or exudates noted.  Low suspicion patient would need  hospitalized due to viral infection or Covid as vital signs reassuring, patient is not in respiratory distress.    Plan-  URI-likely patient from viral infection, will recommend over-the-counter pain medications, staying hydrated, follow-up with PCP for further evaluation.  Gave her strict return precautions.  Vital signs have remained stable, no  indication for hospital admission.    Patient given at home care as well strict return precautions.  Patient verbalized that they understood agreed to said plan.  Final Clinical Impression(s) / ED Diagnoses Final diagnoses:  Rales    Rx / DC Orders ED Discharge Orders     None        Aron Baba 08/01/21 2207    Noemi Chapel, MD 08/03/21 1432

## 2021-08-01 NOTE — Discharge Instructions (Addendum)
Lab work imaging are all reassuring, your respiratory panel was negative, you tested negative for COVID and the flu.  Likely you have a viral infection, recommend continue with over-the-counter pain medication like ibuprofen Tylenol as needed for pain control as well as fever control.  Please stay hydrated.  Please follow your PCP as needed.  Come back to the emergency department if you develop chest pain, shortness of breath, severe abdominal pain, uncontrolled nausea, vomiting, diarrhea.

## 2021-08-05 ENCOUNTER — Telehealth: Payer: Self-pay | Admitting: Cardiology

## 2021-08-05 NOTE — Telephone Encounter (Signed)
Patient would like a provider switch from Dr. Gardiner Rhyme to Dr. Percival Spanish due to patient being close to [facility] location.

## 2021-08-05 NOTE — Telephone Encounter (Signed)
OK with me.

## 2021-08-06 ENCOUNTER — Emergency Department (HOSPITAL_COMMUNITY)
Admission: EM | Admit: 2021-08-06 | Discharge: 2021-08-06 | Disposition: A | Discharge status: Home / Self Care | Payer: Medicaid Other | Attending: Emergency Medicine | Admitting: Emergency Medicine

## 2021-08-06 ENCOUNTER — Other Ambulatory Visit: Payer: Self-pay

## 2021-08-06 ENCOUNTER — Emergency Department (HOSPITAL_COMMUNITY): Payer: Medicaid Other

## 2021-08-06 ENCOUNTER — Encounter (HOSPITAL_COMMUNITY): Payer: Self-pay | Admitting: *Deleted

## 2021-08-06 DIAGNOSIS — Z7951 Long term (current) use of inhaled steroids: Secondary | ICD-10-CM | POA: Diagnosis not present

## 2021-08-06 DIAGNOSIS — E039 Hypothyroidism, unspecified: Secondary | ICD-10-CM | POA: Diagnosis not present

## 2021-08-06 DIAGNOSIS — Z87891 Personal history of nicotine dependence: Secondary | ICD-10-CM | POA: Insufficient documentation

## 2021-08-06 DIAGNOSIS — Z79899 Other long term (current) drug therapy: Secondary | ICD-10-CM | POA: Insufficient documentation

## 2021-08-06 DIAGNOSIS — R0789 Other chest pain: Secondary | ICD-10-CM | POA: Insufficient documentation

## 2021-08-06 DIAGNOSIS — J45909 Unspecified asthma, uncomplicated: Secondary | ICD-10-CM | POA: Insufficient documentation

## 2021-08-06 DIAGNOSIS — E119 Type 2 diabetes mellitus without complications: Secondary | ICD-10-CM | POA: Insufficient documentation

## 2021-08-06 DIAGNOSIS — R059 Cough, unspecified: Secondary | ICD-10-CM | POA: Diagnosis not present

## 2021-08-06 DIAGNOSIS — R079 Chest pain, unspecified: Secondary | ICD-10-CM | POA: Diagnosis present

## 2021-08-06 LAB — CBC WITH DIFFERENTIAL/PLATELET
Abs Immature Granulocytes: 0.03 10*3/uL (ref 0.00–0.07)
Basophils Absolute: 0 10*3/uL (ref 0.0–0.1)
Basophils Relative: 0 %
Eosinophils Absolute: 0.1 10*3/uL (ref 0.0–0.5)
Eosinophils Relative: 1 %
HCT: 37.1 % (ref 36.0–46.0)
Hemoglobin: 12.3 g/dL (ref 12.0–15.0)
Immature Granulocytes: 0 %
Lymphocytes Relative: 24 %
Lymphs Abs: 1.9 10*3/uL (ref 0.7–4.0)
MCH: 28.3 pg (ref 26.0–34.0)
MCHC: 33.2 g/dL (ref 30.0–36.0)
MCV: 85.5 fL (ref 80.0–100.0)
Monocytes Absolute: 0.4 10*3/uL (ref 0.1–1.0)
Monocytes Relative: 5 %
Neutro Abs: 5.4 10*3/uL (ref 1.7–7.7)
Neutrophils Relative %: 70 %
Platelets: 308 10*3/uL (ref 150–400)
RBC: 4.34 MIL/uL (ref 3.87–5.11)
RDW: 13.8 % (ref 11.5–15.5)
WBC: 7.8 10*3/uL (ref 4.0–10.5)
nRBC: 0 % (ref 0.0–0.2)

## 2021-08-06 LAB — BASIC METABOLIC PANEL
Anion gap: 12 (ref 5–15)
BUN: 12 mg/dL (ref 6–20)
CO2: 24 mmol/L (ref 22–32)
Calcium: 8.9 mg/dL (ref 8.9–10.3)
Chloride: 101 mmol/L (ref 98–111)
Creatinine, Ser: 0.72 mg/dL (ref 0.44–1.00)
GFR, Estimated: >60 mL/min (ref >60)
Glucose, Bld: 180 mg/dL — ABNORMAL HIGH (ref 70–99)
Potassium: 3.5 mmol/L (ref 3.5–5.1)
Sodium: 137 mmol/L (ref 135–145)

## 2021-08-06 LAB — TROPONIN I (HIGH SENSITIVITY): Troponin I (High Sensitivity): 4 ng/L (ref <18)

## 2021-08-06 LAB — MAGNESIUM: Magnesium: 2.1 mg/dL (ref 1.7–2.4)

## 2021-08-06 IMAGING — DX DG CHEST 2V
2 series · 2 of 2 positions shown · non-contrast
Comparison: [DATE], [DATE]

CLINICAL DATA: Chest pain

EXAM:
CHEST - 2 VIEW

[chest pa]
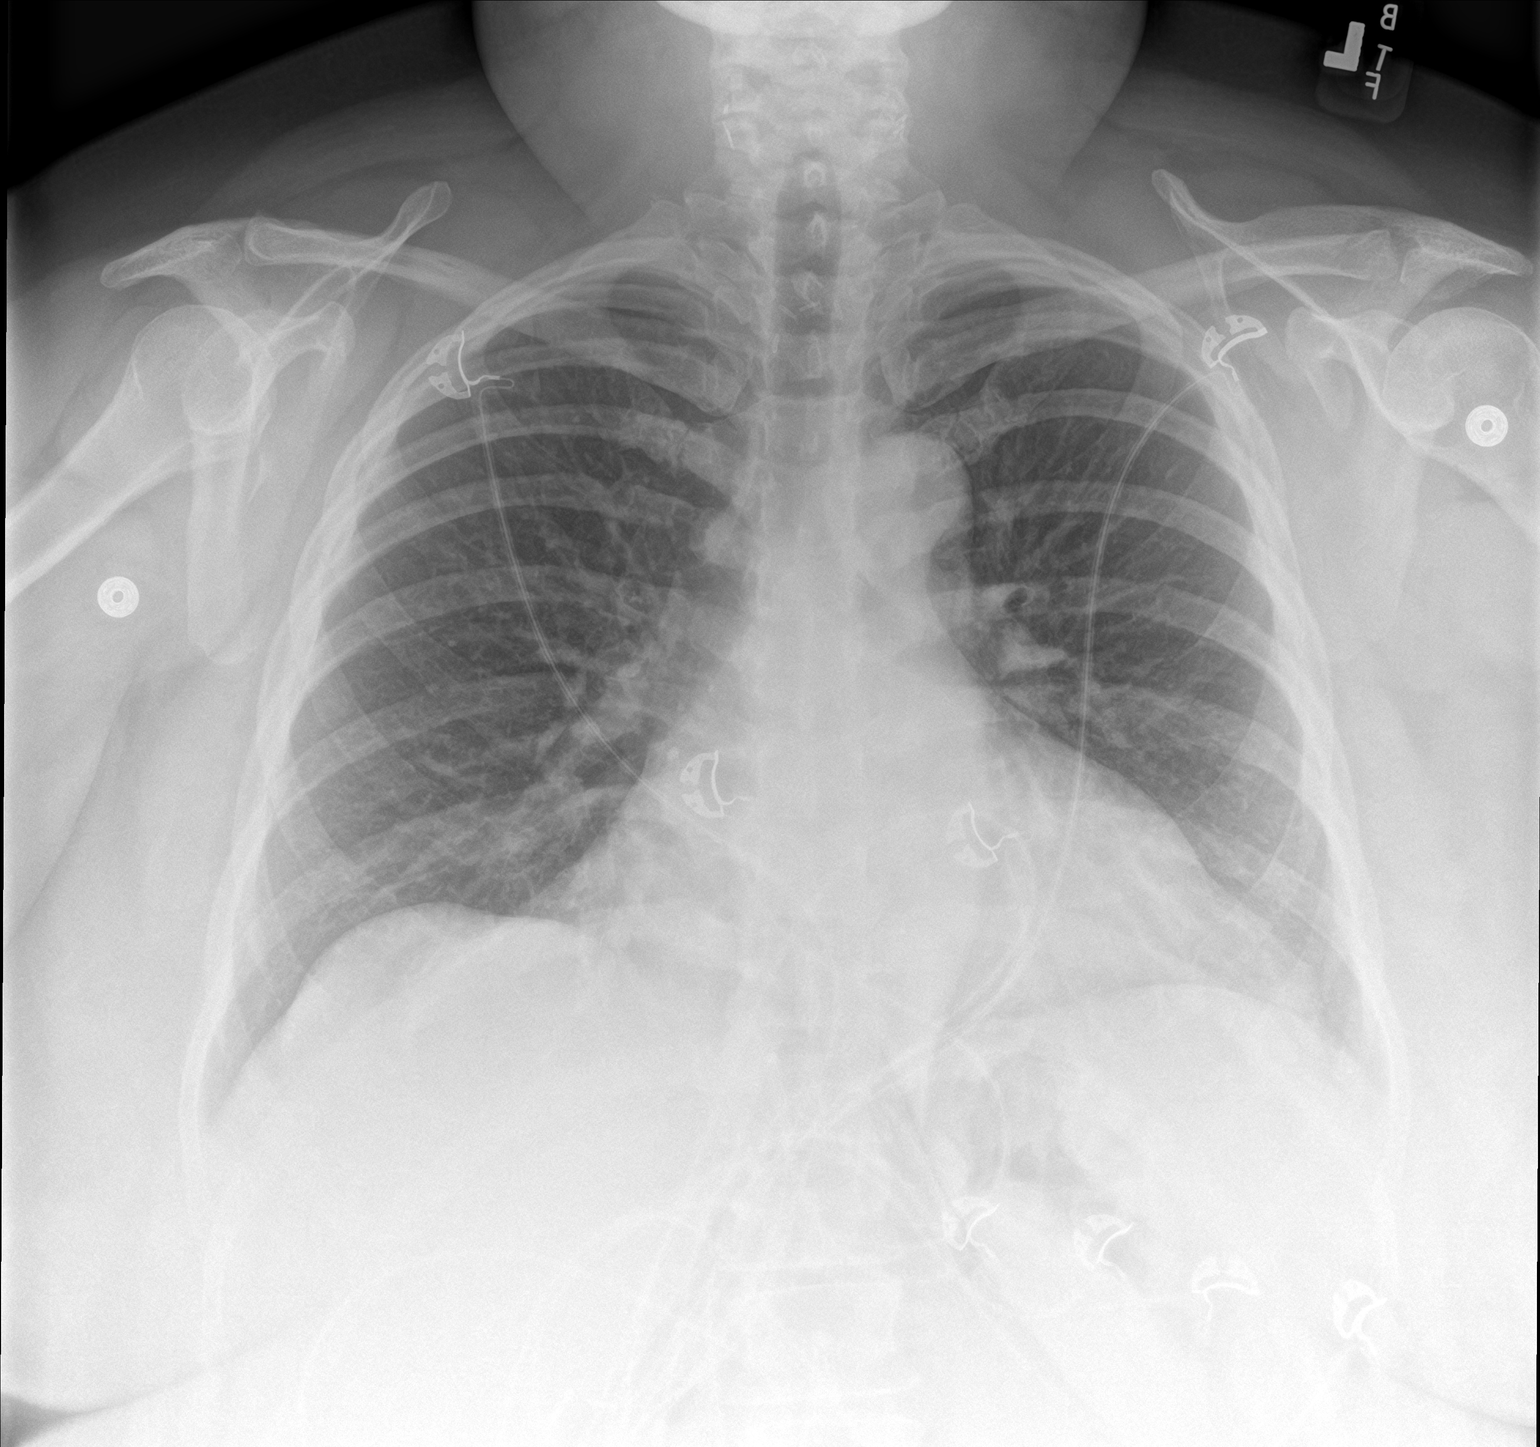

[chest lat]
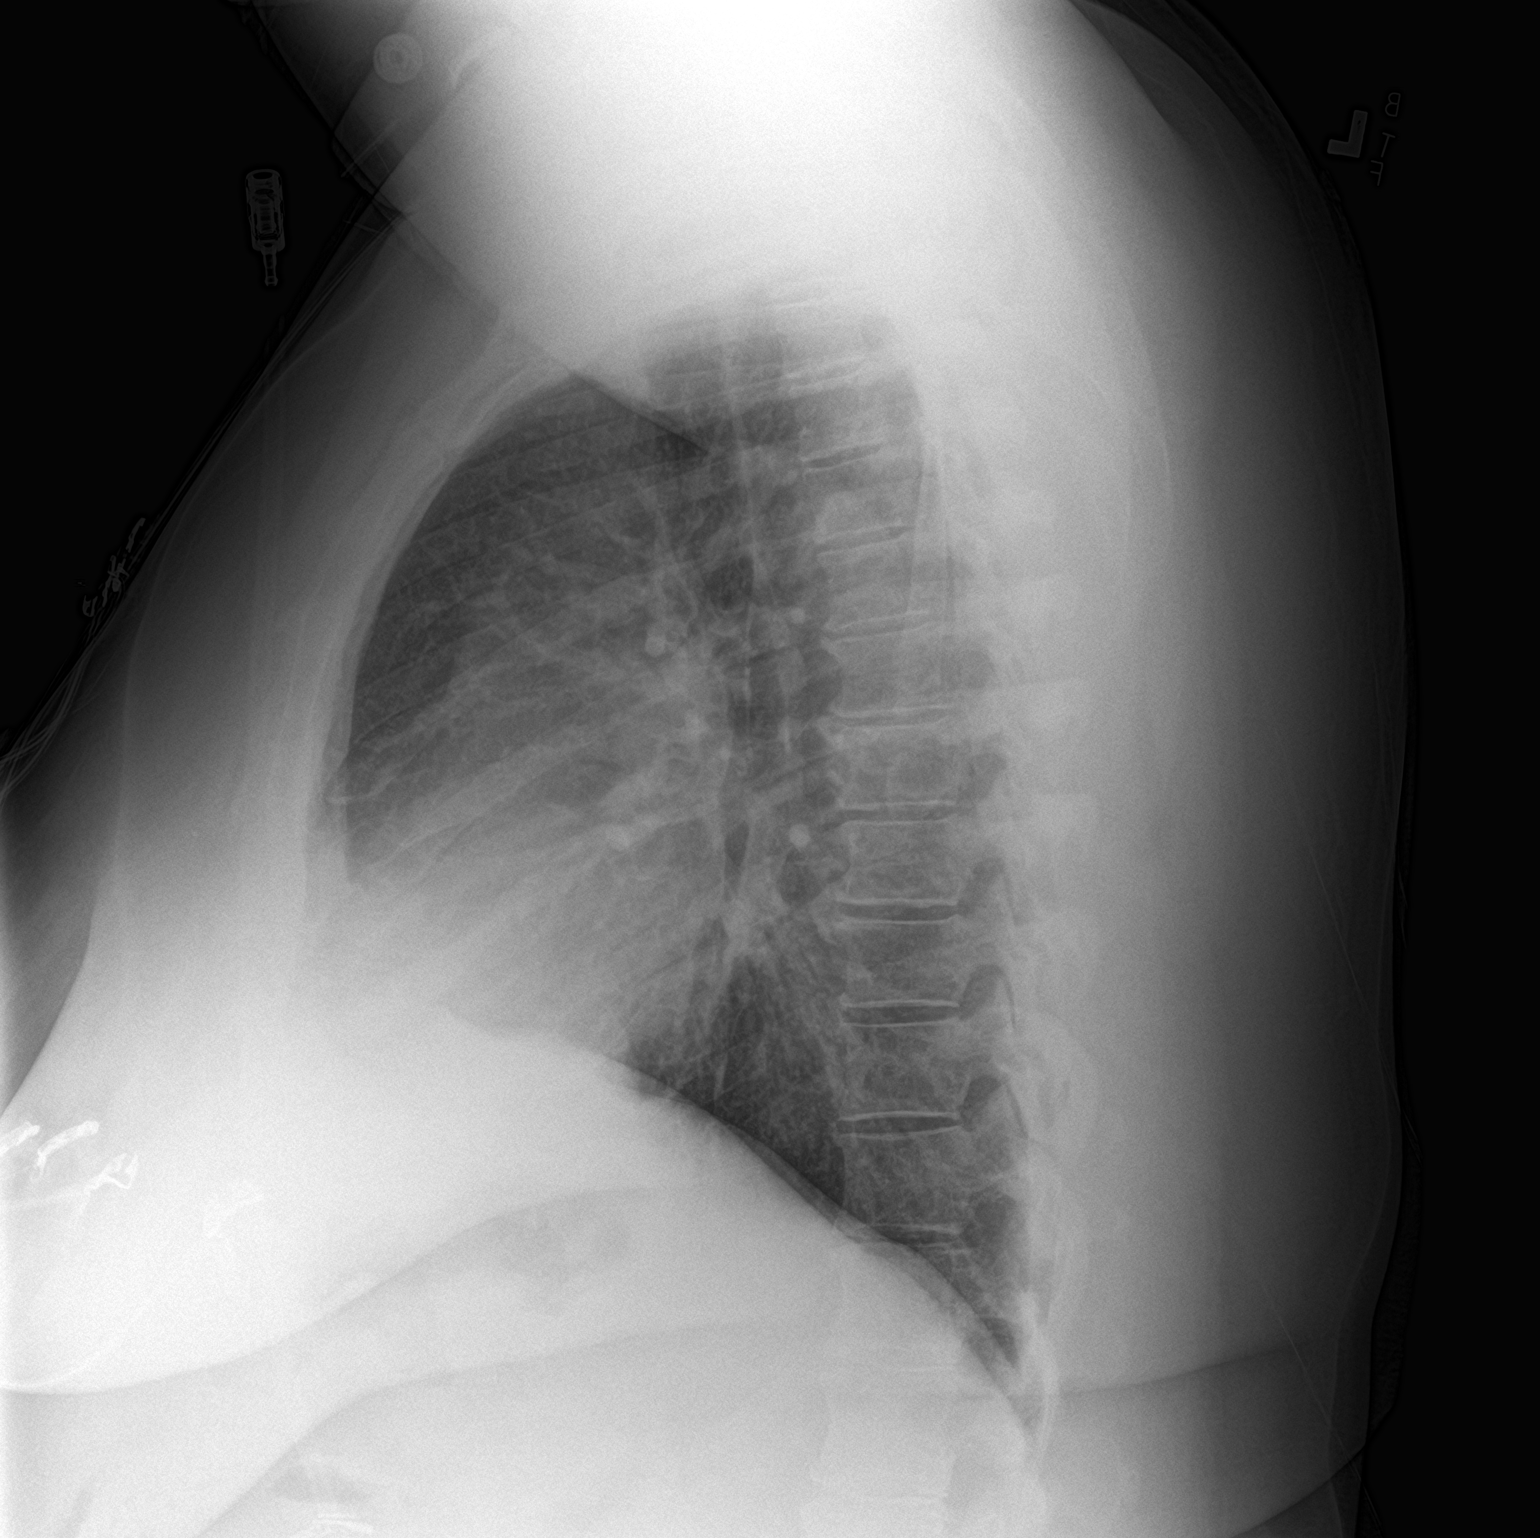

[2 of 2 positions shown; findings below may reference images not displayed]

FINDINGS: The heart size and mediastinal contours are within normal limits.
Both lungs are clear. The visualized skeletal structures are
unremarkable.
IMPRESSION: No active cardiopulmonary disease.

## 2021-08-06 NOTE — ED Triage Notes (Signed)
Requesting magnesium level check. States she was here recently and the did not check her magnesium

## 2021-08-06 NOTE — ED Provider Notes (Signed)
[facility] Provider Note   CSN: [id number] Arrival date & time: 08/06/21  1726     History Chief Complaint  Patient presents with   Chest Pain    Heather Fry is a 45 y.o. female.  ELSIA Fry is a 45 y.o. female with hx of hypothyroidism, GERD, DM, IBS, depression & anxiety, who presents to the ED for evaluation of chest pain and concern about her electrolytes. Pt reports for the past two days she has been feeling fatigued and is worried it is because her electrolytes are off. She reports this has been an issue for her previously and she takes several supplements daily to try and keep her levels up. She also reports she has had an intermittent soreness in her chest. This comes and goes, is not worse with exertion. She reports and occasional cough, no fevers or shortness of breath. No nausea, vomiting or abdominal pain. Pt reports she was previously having some might sweats and started taking an extra magnesium supplement with improvement but is concerned she could be taking too much. She denies diarrhea. No other aggravating or alleviating factors,  The history is provided by the patient and medical records.      Past Medical History:  Diagnosis Date   Allergic asthma    Anemia    Anxiety    Arthritis    Depression    Diabetes mellitus without complication ([place])    Diverticulosis    Fatty liver    moderate to severe   GERD (gastroesophageal reflux disease)    Graves disease    Headache    tension, ocular migraines   Hypothyroidism    Internal hemorrhoids    Pneumonia    as an infant   Sleep apnea    Thyroid disease    Uterine fibroid     Patient Active Problem List   Diagnosis Date Noted   Hypocalcemia 08/08/2021   Diabetes ([place]) 05/30/2021   Irritable bowel syndrome 04/17/2021   Weight gain 12/20/2020   Diarrhea 05/08/2020   Idiopathic peripheral neuropathy 04/18/2020   Myelopathy ([place]) 04/18/2020   OSA on CPAP 04/18/2020   RLS  (restless legs syndrome) 04/18/2020   Sensory disturbance 01/20/2020   Atypical chest pain 01/20/2020   Obesity, Class III, BMI 40-49.9 (morbid obesity) ([place]) 01/20/2020   Weakness 01/19/2020   Hyperglycemia 10/04/2019   Myalgia 09/05/2019   Vitamin B 12 deficiency 07/28/2019   Right sided numbness 06/29/2019   Constipation 06/15/2019   Elevated lipase 06/15/2019   Hypomagnesemia 03/29/2019   Leukocytosis 03/29/2019   Pain of upper abdomen 02/10/2019   Nausea without vomiting 02/10/2019   Tingling of face 11/30/2018   Hot flashes 11/30/2018   Vitamin D deficiency 10/06/2018   GERD (gastroesophageal reflux disease) 01/28/2017   Rectal bleeding 01/28/2017   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   ABDOMINAL HYSTERECTOMY     CARPAL TUNNEL RELEASE Right 06/11/2021   Procedure: Right Carpel tunnel release;  Surgeon: Vallarie Mare, MD;  Location: [facility];  Service: Neurosurgery;  Laterality: Right;  RM 18   CHOLECYSTECTOMY N/A 11/06/2014   Procedure: LAPAROSCOPIC CHOLECYSTECTOMY;  Surgeon: Jamesetta So, MD;  Location: [facility];  Service: General;  Laterality: N/A;  COLONOSCOPY N/A 03/04/2017   Dr. Gala Romney: Hemorrhoids, diverticulosis, benign polyp without adenomatous changes.  Next colonoscopy 10 years   DILATION AND CURETTAGE OF UTERUS     ESOPHAGOGASTRODUODENOSCOPY N/A 10/02/2014   Dr. Gala Romney: fundic gland polyps, hiatal hernia   ESOPHAGOGASTRODUODENOSCOPY  07/2020   [facility] ; normal esophagus biopsied, benign-appearing gastric polyps biopsied, gastric biopsies obtained to evaluate for H. pylori, normal duodenum.  Pathology with mild chronic gastritis, negative for H. pylori, fundic gland polyps, esophageal biopsy benign.    MOUTH SURGERY     extraction of teeth   POLYPECTOMY  03/04/2017   Procedure: POLYPECTOMY;  Surgeon: Daneil Dolin, MD;  Location: [facility];  Service: Endoscopy;;  colon   THYROIDECTOMY N/A 09/30/2018   Procedure: TOTAL THYROIDECTOMY;  Surgeon: Armandina Gemma, MD;  Location: [facility];  Service: General;  Laterality: N/A;   TUBAL LIGATION       OB History     Gravida  2   Para  2   Term  2   Preterm      AB      Living  2      SAB      IAB      Ectopic      Multiple      Live Births              Family History  Problem Relation Age of Onset   Uterine cancer Mother        ?cervical cancer   Hypertension Father    Diabetes Father    Uterine cancer Sister        suicide   Colon cancer Paternal Aunt 64   Depression Other    Thyroid disease Neg Hx    Pancreatic cancer Neg Hx     Social History   Tobacco Use   Smoking status: Former    Packs/day: 1.00    Years: 5.00    Pack years: 5.00    Types: Cigarettes    Quit date: 11/03/1997    Years since quitting: 23.7   Smokeless tobacco: Never   Tobacco comments:    1 pack 1 week  Vaping Use   Vaping Use: Never used  Substance Use Topics   Alcohol use: No    Alcohol/week: 0.0 standard drinks   Drug use: No    Home Medications Prior to Admission medications   Medication Sig Start Date End Date Taking? Authorizing Provider  acarbose (PRECOSE) 50 MG tablet Take 1 tablet (50 mg total) by mouth 3 (three) times daily with meals. 05/30/21   Renato Shin, MD  acetaminophen (TYLENOL) 500 MG tablet Take 1,000 mg by mouth daily as needed for moderate pain.    [provider]  albuterol (VENTOLIN HFA) 108 (90 Base) MCG/ACT inhaler Inhale 2 puffs into the lungs every 6 (six) hours as needed. Patient taking differently: Inhale 2 puffs into the lungs every 6 (six) hours as needed for wheezing or shortness of breath. 12/13/19   Julian Hy, DO  cetirizine (ZYRTEC) 10 MG tablet Take 10 mg by mouth daily.     [provider]  Cholecalciferol (VITAMIN D3) 125 MCG (5000 UT) CAPS Take 1 capsule (5,000 Units total) by mouth daily. Patient taking differently: Take 10,000 Units by mouth daily. 10/13/19   Renato Shin, MD  dicyclomine (BENTYL) 10 MG capsule Take 1 capsule (10 mg total) by mouth up to 3 times daily before meals and at bedtime for abdominal cramping  or diarrhea.  Hold in the setting of constipation. 04/17/21   Erenest Rasher, PA-C  EPINEPHrine 0.3 mg/0.3 mL IJ SOAJ injection Inject 0.3 mg into the muscle as needed for anaphylaxis. Patient not taking: Reported on 08/08/2021 08/10/20   [provider]  escitalopram (LEXAPRO) 20 MG tablet Take 1 tablet (20 mg total) by mouth at bedtime. 01/26/19   Renato Shin, MD  fluticasone (FLONASE) 50 MCG/ACT nasal spray Place 2 sprays into both nostrils at bedtime.    [provider]  glucose blood (ACCU-CHEK AVIVA PLUS) test strip 1 each by Other route daily. And lancets 1/day 05/30/21   Renato Shin, MD  HYDROcodone-acetaminophen (NORCO/VICODIN) 5-325 MG tablet Take 1 tablet by mouth every 4 (four) hours as needed for moderate pain or severe pain. Patient not taking: Reported on 08/08/2021 06/11/21 06/11/22  Vallarie Mare, MD  ipratropium (ATROVENT) 0.02 % nebulizer solution Take 0.5 mg by nebulization daily.    [provider]  LIDOCAINE-MENTHOL ROLL-ON EX Apply 1 application topically daily as needed (pain).    [provider]  Magnesium 250 MG TABS Take 250 mg by mouth at bedtime.     [provider]  NON FORMULARY at bedtime. CPAP at night    [provider]  nystatin cream (MYCOSTATIN) Apply 1 application topically daily as needed (toe fungus). 03/15/21   [provider]  ondansetron (ZOFRAN ODT) 4 MG disintegrating tablet Take 1 tablet (4 mg total) by mouth every 8 (eight) hours as needed for nausea or vomiting. 07/08/21   Evalee Jefferson, PA-C  pantoprazole (PROTONIX) 40 MG tablet  Take 1 tablet (40 mg total) by mouth 2 (two) times daily. 04/17/21   Erenest Rasher, PA-C  repaglinide (PRANDIN) 0.5 MG tablet Take 1 tablet (0.5 mg total) by mouth 2 (two) times daily before a meal. 08/09/21   Renato Shin, MD  RESTASIS 0.05 % ophthalmic emulsion Place 1 drop into both eyes 2 (two) times daily. 12/26/19   [provider]  Simethicone 250 MG CAPS Take 500 mg by mouth at bedtime.    [provider]  sucralfate (CARAFATE) 1 g tablet Take 1 g by mouth daily. Patient not taking: Reported on 08/08/2021 08/20/20   [provider]  SYNTHROID 150 MCG tablet Take 150 mcg by mouth daily. 04/06/21   [provider]  terbinafine (LAMISIL) 250 MG tablet Take 250 mg by mouth daily. Patient not taking: Reported on 08/08/2021 03/15/21   [provider]    Allergies    Levofloxacin, Toradol [ketorolac tromethamine], Tramadol, Levomenol, Other, Chamomile, Hctz [hydrochlorothiazide], Lisinopril, Losartan potassium, Penicillins, and Triamcinolone acetonide  Review of Systems   Review of Systems  Constitutional:  Positive for fatigue. Negative for chills and fever.  HENT: Negative.    Respiratory:  Positive for cough. Negative for shortness of breath.   Cardiovascular:  Positive for chest pain.  Gastrointestinal:  Negative for abdominal pain, diarrhea, nausea and vomiting.  Genitourinary:  Negative for dysuria.  Musculoskeletal:  Negative for arthralgias and myalgias.  Skin:  Negative for color change and rash.  Neurological:  Negative for dizziness, syncope, weakness, light-headedness and headaches.  All other systems reviewed and are negative.  Physical Exam Updated Vital Signs BP (!) 145/83   Pulse 84   Temp 98.2 F (36.8 C) (Oral)   Resp 18   Ht 5\' 4"  (1.626 m)   Wt 118 kg   LMP 10/09/2014   SpO2 99%   BMI 44.65 kg/m  Physical Exam Vitals and nursing note reviewed.  Constitutional:      General: She is not in acute distress.     Appearance: Normal appearance. She is well-developed. She is not diaphoretic.  HENT:     Head: Normocephalic and atraumatic.  Eyes:     General:        Right eye: No discharge.        Left eye: No discharge.     Pupils: Pupils are equal, round, and reactive to light.  Cardiovascular:     Rate and Rhythm: Normal rate and regular rhythm.     Pulses: Normal pulses.          Radial pulses are 2+ on the right side and 2+ on the left side.       Dorsalis pedis pulses are 2+ on the right side and 2+ on the left side.     Heart sounds: Normal heart sounds.  Pulmonary:     Effort: Pulmonary effort is normal. No respiratory distress.     Breath sounds: Normal breath sounds. No wheezing or rales.     Comments: Respirations equal and unlabored, patient able to speak in full sentences, lungs clear to auscultation bilaterally  Chest:     Chest wall: No tenderness.  Abdominal:     General: Bowel sounds are normal. There is no distension.     Palpations: Abdomen is soft. There is no mass.     Tenderness: There is no abdominal tenderness. There is no guarding.     Comments: Abdomen soft, nondistended, nontender to palpation in all quadrants without guarding or peritoneal signs  Musculoskeletal:        General: No deformity.     Cervical back: Neck supple.  Skin:    General: Skin is warm and dry.     Capillary Refill: Capillary refill takes less than 2 seconds.  Neurological:     Mental Status: She is alert and oriented to person, place, and time.     Coordination: Coordination normal.     Comments: Speech is clear, able to follow commands Moves extremities without ataxia, coordination intact  Psychiatric:        Mood and Affect: Mood normal.        Behavior: Behavior normal.    ED Results / Procedures / Treatments   Labs (all labs ordered are listed, but only abnormal results are displayed) Labs Reviewed  BASIC METABOLIC PANEL - Abnormal; Notable for the following components:      Result  Value   Glucose, Bld 180 (*)    All other components within normal limits  CBC WITH DIFFERENTIAL/PLATELET  MAGNESIUM  TROPONIN I (HIGH SENSITIVITY)    EKG EKG Interpretation  Date/Time:  Tuesday August 06 2021 17:50:27 EST Ventricular Rate:  90 PR Interval:  166 QRS Duration: 84 QT Interval:  396 QTC Calculation: 485 R Axis:   54 Text Interpretation: Sinus rhythm Consider left atrial enlargement Low voltage, precordial leads since last tracing no significant change Confirmed by Daleen Bo [phone]) on 08/06/2021 6:46:28 PM  Radiology DG Chest 2 View  Result Date: 08/06/2021 CLINICAL DATA:  Chest pain EXAM: CHEST - 2 VIEW COMPARISON:  08/01/2021, 06/22/2021 FINDINGS: The heart size and mediastinal contours are within normal limits. Both lungs are clear. The visualized skeletal structures are unremarkable. IMPRESSION: No active cardiopulmonary disease. Electronically Signed   By: Donavan Foil M.D.   On: 08/06/2021 19:57     Procedures Procedures   Medications Ordered in  ED Medications - No data to display  ED Course  I have reviewed the triage vital signs and the nursing notes.  Pertinent labs & imaging results that were available during my care of the patient were reviewed by me and considered in my medical decision making (see chart for details).    MDM Rules/Calculators/A&P                           Patient presents to the emergency department with chest pain and cocnern for electrolyte issues. Patient nontoxic appearing, in no apparent distress, vitals without significant abnormality. Fairly benign physical exam.    DDX: ACS, pulmonary embolism, dissection, pneumothorax, pneumonia, arrhythmia, severe anemia, MSK, GERD, anxiety, abdominal process.   Additional history obtained:  Additional history obtained from chart review & nursing note review.   EKG: Sinus rhythm w/o concerning changes  Lab Tests:  I Ordered, reviewed, and interpreted labs, which included:   CBC: No leukocytosis, normal Hgb BMP: Glucose 180, pt not fasting, no other electrolyte derangements, normal renal function Mag: WNL Troponin: WNL  Imaging Studies ordered:  I ordered imaging studies which included CXR, I independently reviewed, formal radiology impression shows:  No active cardiopulmonary disease  ED Course:   EKG without obvious acute ischemia, delta troponin negative, doubt ACS. Patient is low risk wells, PERC negative, doubt pulmonary embolism. Pain is not a tearing sensation, symmetric pulses, no widening of mediastinum on CXR, doubt dissection. Cardiac monitor reviewed, no notable arrhythmias or tachycardia. Patient has appeared hemodynamically stable throughout ER visit and appears safe for discharge with close PCP/cardiology follow up. I discussed results, treatment plan, need for PCP follow-up, and return precautions with the patient. Provided opportunity for questions, patient confirmed understanding and is in agreement with plan.    Portions of this note were generated with [employer]. Dictation errors may occur despite best attempts at proofreading.    Final Clinical Impression(s) / ED Diagnoses Final diagnoses:  Atypical chest pain    Rx / DC Orders ED Discharge Orders     None        Janet Berlin 08/09/21 2039    Daleen Bo, MD 08/10/21 1228

## 2021-08-06 NOTE — ED Triage Notes (Signed)
States her asthma is acting up and she feels like her chest is tight

## 2021-08-06 NOTE — ED Notes (Signed)
Pt reports concern about magnesium levels because she has had a hx of low mag. States she started taking mag supplements 250mg  every night for a few years, but just recently decided to up dose to 500mg  d/t her concern that levels were low.; only took 250mg  today. Also reports that on the way here she started feeling tightness in throat and decided to take her albuterol inhaler before arrival and states she feels it did help. Chest pain started in the middle of the night last night and made her nervous. She recently started trying to be more active to lose weight and unsure if this is related or something more.

## 2021-08-06 NOTE — Discharge Instructions (Signed)
Your evaluation today was reassuring.  I do not see signs of a cardiac cause for your chest pain.  Your electrolytes are normal today.  Please continue taking your medications as prescribed and follow-up with your primary care doctor.  Return for new or worsening symptoms.

## 2021-08-08 ENCOUNTER — Encounter: Payer: Medicaid Other | Attending: Endocrinology | Admitting: Dietician

## 2021-08-08 ENCOUNTER — Encounter: Payer: Self-pay | Admitting: Dietician

## 2021-08-08 ENCOUNTER — Telehealth (INDEPENDENT_AMBULATORY_CARE_PROVIDER_SITE_OTHER): Payer: Medicaid Other | Admitting: Endocrinology

## 2021-08-08 ENCOUNTER — Other Ambulatory Visit: Payer: Self-pay

## 2021-08-08 VITALS — Ht 64.0 in | Wt 260.0 lb

## 2021-08-08 DIAGNOSIS — E038 Other specified hypothyroidism: Secondary | ICD-10-CM

## 2021-08-08 DIAGNOSIS — E119 Type 2 diabetes mellitus without complications: Secondary | ICD-10-CM

## 2021-08-08 LAB — BASIC METABOLIC PANEL
BUN: 12 mg/dL (ref 6–23)
CO2: 28 mEq/L (ref 19–32)
Calcium: 9.1 mg/dL (ref 8.4–10.5)
Chloride: 102 mEq/L (ref 96–112)
Creatinine, Ser: 0.86 mg/dL (ref 0.40–1.20)
GFR: 81.57 mL/min (ref >60.00)
Glucose, Bld: 140 mg/dL — ABNORMAL HIGH (ref 70–99)
Potassium: 3.9 mEq/L (ref 3.5–5.1)
Sodium: 138 mEq/L (ref 135–145)

## 2021-08-08 LAB — TSH: TSH: 1.49 u[IU]/mL (ref 0.35–5.50)

## 2021-08-08 LAB — HEMOGLOBIN A1C: Hgb A1c MFr Bld: 8 % — ABNORMAL HIGH (ref 4.6–6.5)

## 2021-08-08 LAB — VITAMIN D 25 HYDROXY (VIT D DEFICIENCY, FRACTURES): VITD: 69.3 ng/mL (ref 30.00–100.00)

## 2021-08-08 LAB — T4, FREE: Free T4: 0.89 ng/dL (ref 0.60–1.60)

## 2021-08-08 LAB — MAGNESIUM: Magnesium: 1.9 mg/dL (ref 1.5–2.5)

## 2021-08-08 NOTE — Patient Instructions (Addendum)
Aim for 30 minutes of exercise most days. - gym, yoga, walking  Start slow and increase as tolerate  Continue to take your medication as prescribed Continue to be observant about your glucose readings. A meal may cause a jump of 40-60 points but not increasing is still fine.   Premeal to 2 hours post meal.  Calorie King app  Increase your non starchy vegetables Whole grains, greens, beans, and nuts are good sources of magnesium and potassium.

## 2021-08-08 NOTE — Patient Instructions (Addendum)
Blood tests are requested for you today.  We'll let you know about the results.  Please come back for a follow-up appointment in 3 months.   

## 2021-08-08 NOTE — Progress Notes (Signed)
Subjective:    Patient ID: Heather Fry, female    DOB: April 19, 1976, 45 y.o.   MRN: [id number]  HPI telehealth visit today via video visit.  Alternatives to telehealth are presented to this patient, and the patient agrees to the telehealth visit. Pt is advised of the cost of the visit, and agrees to this, also.   Patient is in her car, and I am at the office.   Persons attending the telehealth visit: the patient and I Pt returns for f/u of postsurgical hypothyroidism (she had thyroidectomy 1/20, for hyperthyroidism; she was rx'ed synthroid soon thereafter).  She takes synthroid as rx'ed.   She also has mild postsurgical hypoparathyroidism (she takes Vit-D, 10000 units/day).  She was seen in ER a few days ago, with----------------------------------------------------- She also has hypomagnesemia (she still takes 250 mg qd).   She has chronic sxs: these have been normal: serum catechols, cortisol, and FSH/LH; she has had TAH but not BSO.    B-12 def: she stopped rx, due to high level.  Type 2 DM: she takes acarbose 100 mg 3 times a day (just before each meal).  She has not recently used continuous glucose monitor.  Glucose varies from 95-287.  There is no trend throughout the day. No recent steroids Past Medical History:  Diagnosis Date   Allergic asthma    Anemia    Anxiety    Arthritis    Depression    Diabetes mellitus without complication (HCC)    Diverticulosis    Fatty liver    moderate to severe   GERD (gastroesophageal reflux disease)    Graves disease    Headache    tension, ocular migraines   Hypothyroidism    Internal hemorrhoids    Pneumonia    as an infant   Sleep apnea    Thyroid disease    Uterine fibroid     Past Surgical History:  Procedure Laterality Date   ABDOMINAL HYSTERECTOMY     Per patient, uterine fibroids.   ABDOMINAL HYSTERECTOMY     CARPAL TUNNEL RELEASE Right 06/11/2021   Procedure: Right Carpel tunnel release;  Surgeon: Vallarie Mare,  MD;  Location: [facility];  Service: Neurosurgery;  Laterality: Right;  RM 18   CHOLECYSTECTOMY N/A 11/06/2014   Procedure: LAPAROSCOPIC CHOLECYSTECTOMY;  Surgeon: Jamesetta So, MD;  Location: [facility];  Service: General;  Laterality: N/A;   COLONOSCOPY N/A 03/04/2017   Dr. Gala Romney: Hemorrhoids, diverticulosis, benign polyp without adenomatous changes.  Next colonoscopy 10 years   DILATION AND CURETTAGE OF UTERUS     ESOPHAGOGASTRODUODENOSCOPY N/A 10/02/2014   Dr. Gala Romney: fundic gland polyps, hiatal hernia   ESOPHAGOGASTRODUODENOSCOPY  07/2020   [facility] ; normal esophagus biopsied, benign-appearing gastric polyps biopsied, gastric biopsies obtained to evaluate for H. pylori, normal duodenum.  Pathology with mild chronic gastritis, negative for H. pylori, fundic gland polyps, esophageal biopsy benign.   MOUTH SURGERY     extraction of teeth   POLYPECTOMY  03/04/2017   Procedure: POLYPECTOMY;  Surgeon: Daneil Dolin, MD;  Location: [facility];  Service: Endoscopy;;  colon   THYROIDECTOMY N/A 09/30/2018   Procedure: TOTAL THYROIDECTOMY;  Surgeon: Armandina Gemma, MD;  Location: [facility];  Service: General;  Laterality: N/A;   TUBAL LIGATION      Social History   Socioeconomic History   Marital status: Married    Spouse name: Not on file   Number of children: Not on file   Years of  education: Not on file   Highest education level: Not on file  Occupational History   Not on file  Tobacco Use   Smoking status: Former    Packs/day: 1.00    Years: 5.00    Pack years: 5.00    Types: Cigarettes    Quit date: 11/03/1997    Years since quitting: 23.7   Smokeless tobacco: Never   Tobacco comments:    1 pack 1 week  Vaping Use   Vaping Use: Never used  Substance and Sexual Activity   Alcohol use: No    Alcohol/week: 0.0 standard drinks   Drug use: No   Sexual activity: Yes    Birth control/protection: Surgical  Other Topics Concern   Not on file  Social History Narrative   Not  on file   Social Determinants of Health   Financial Resource Strain: Not on file  Food Insecurity: Not on file  Transportation Needs: Not on file  Physical Activity: Not on file  Stress: Not on file  Social Connections: Not on file  Intimate Partner Violence: Not on file    Current Outpatient Medications on File Prior to Visit  Medication Sig Dispense Refill   acarbose (PRECOSE) 50 MG tablet Take 1 tablet (50 mg total) by mouth 3 (three) times daily with meals. 270 tablet 3   acetaminophen (TYLENOL) 500 MG tablet Take 1,000 mg by mouth daily as needed for moderate pain.     albuterol (VENTOLIN HFA) 108 (90 Base) MCG/ACT inhaler Inhale 2 puffs into the lungs every 6 (six) hours as needed. (Patient taking differently: Inhale 2 puffs into the lungs every 6 (six) hours as needed for wheezing or shortness of breath.) 18 g 11   cetirizine (ZYRTEC) 10 MG tablet Take 10 mg by mouth daily.     Cholecalciferol (VITAMIN D3) 125 MCG (5000 UT) CAPS Take 1 capsule (5,000 Units total) by mouth daily. (Patient taking differently: Take 10,000 Units by mouth daily.) 30 capsule 11   dicyclomine (BENTYL) 10 MG capsule Take 1 capsule (10 mg total) by mouth up to 3 times daily before meals and at bedtime for abdominal cramping or diarrhea.  Hold in the setting of constipation. 90 capsule 0   EPINEPHrine 0.3 mg/0.3 mL IJ SOAJ injection Inject 0.3 mg into the muscle as needed for anaphylaxis. (Patient not taking: Reported on 08/08/2021)     escitalopram (LEXAPRO) 20 MG tablet Take 1 tablet (20 mg total) by mouth at bedtime. 30 tablet 5   fluticasone (FLONASE) 50 MCG/ACT nasal spray Place 2 sprays into both nostrils at bedtime.     glucose blood (ACCU-CHEK AVIVA PLUS) test strip 1 each by Other route daily. And lancets 1/day 100 each 3   HYDROcodone-acetaminophen (NORCO/VICODIN) 5-325 MG tablet Take 1 tablet by mouth every 4 (four) hours as needed for moderate pain or severe pain. (Patient not taking: Reported on  08/08/2021) 20 tablet 0   ipratropium (ATROVENT) 0.02 % nebulizer solution Take 0.5 mg by nebulization daily.     LIDOCAINE-MENTHOL ROLL-ON EX Apply 1 application topically daily as needed (pain).     Magnesium 250 MG TABS Take 250 mg by mouth at bedtime.      NON FORMULARY at bedtime. CPAP at [facility]     nystatin cream (MYCOSTATIN) Apply 1 application topically daily as needed (toe fungus).     ondansetron (ZOFRAN ODT) 4 MG disintegrating tablet Take 1 tablet (4 mg total) by mouth every 8 (eight) hours as needed for nausea  or vomiting. 20 tablet 0   pantoprazole (PROTONIX) 40 MG tablet Take 1 tablet (40 mg total) by mouth 2 (two) times daily. 180 tablet 3   RESTASIS 0.05 % ophthalmic emulsion Place 1 drop into both eyes 2 (two) times daily.     Simethicone 250 MG CAPS Take 500 mg by mouth at bedtime.     sucralfate (CARAFATE) 1 g tablet Take 1 g by mouth daily. (Patient not taking: Reported on 08/08/2021)     SYNTHROID 150 MCG tablet Take 150 mcg by mouth daily.     terbinafine (LAMISIL) 250 MG tablet Take 250 mg by mouth daily. (Patient not taking: Reported on 08/08/2021)     No current facility-administered medications on file prior to visit.    Allergies  Allergen Reactions   Levofloxacin Other (See Comments)    Pt can not have due to taking Lexapro    Toradol [Ketorolac Tromethamine] Anaphylaxis and Hives   Tramadol Hives   Levomenol     Other reaction(s): Other   Other Other (See Comments)   Chamomile Nausea And Vomiting    Lexapro   Hctz [Hydrochlorothiazide] Other (See Comments)    Dehydration   Lisinopril Nausea Only   Losartan Potassium Other (See Comments)    GI upset   Penicillins Other (See Comments)    Loopy, childhood allergy     Triamcinolone Acetonide     Elevated blood pressure, caused diabetes, changed tyroid electrolytes Patient has no tyroid      Family History  Problem Relation Age of Onset   Uterine cancer Mother        ?cervical cancer   Hypertension  Father    Diabetes Father    Uterine cancer Sister        suicide   Colon cancer Paternal Aunt 61   Depression Other    Thyroid disease Neg Hx    Pancreatic cancer Neg Hx     Ht 5\' 4"  (1.626 m)   Wt 260 lb (117.9 kg)   LMP 10/09/2014   BMI 44.63 kg/m    Review of Systems She has lost 11 lbs    Objective:   Physical Exam    Lab Results  Component Value Date   HGBA1C 8.0 (H) 08/08/2021      Assessment & Plan:  Type 2 DM: uncontrolled.  I have sent a prescription to your pharmacy, to add repaglinide Hypothyroidism.  Due for recheck  Patient Instructions  Blood tests are requested for you today.  We'll let you know about the results.  Please come back for a follow-up appointment in 3 months

## 2021-08-08 NOTE — Progress Notes (Signed)
Diabetes Self-Management Education  Visit Type: First/Initial  Appt. Start Time: 1315 Appt. End Time: 1884  08/09/2021  [name], identified by name and date of birth, is a 45 y.o. female with a diagnosis of Diabetes: Type 2.   ASSESSMENT Patient is here today alone. She states that she does not want to have diabetes and would like for this to be in remission. Starts to get low blood sugar symptoms less than 135. She has these symptoms mostly during the day. She states that she has a problem keeping her electrolytes and magnesium up and states that she starts to feel unwell when her labs are in the low normal range. She has ovarian cysts and is to go to surgery 08/21/2021.   History includes Type 2 diabetes (11/2020), Graves disease (thyroid removed - September 30, 2018), OSA on c-pap Libre2 Labs include:  A1C 6.6% 12/13/2020 and increased to 6.2% 08/05/2021, GFR>60 08/06/2021 Medications include Acarbose  Weight hx: 259 lbs 08/08/2021 268 lbs  about 07/18/2021 per patient Reports that she has lost weight due to moving more. 157 lbs 09/2018 prior to thyroid removal  Patient lives with her husband and 47 yo as well as her husband's mother.  Daughter is home schooled.  They share shopping and cooking. She gets food stamps but they do not last the month. Her father is 53 yo and just moved here from [place] and he will help as needed. Patient's husband has diabetes and patient changed his diet and he was able to get off of insulin. Joined local gym November 1 but has not been able to go yet.  She is planning on attending the senior classes to start. Requires no caffeine. Height 5\' 4"  (1.626 m), weight 259 lb (117.5 kg), last menstrual period 10/09/2014. Body mass index is 44.46 kg/m.   Diabetes Self-Management Education - 08/08/21 1331       Visit Information   Visit Type First/Initial      Initial Visit   Diabetes Type Type 2    Are you currently following a meal plan?  No    Are you taking your medications as prescribed? Yes    Date Diagnosed 11/2020      Health Coping   How would you rate your overall health? Poor      Psychosocial Assessment   Patient Belief/Attitude about Diabetes Afraid    Self-care barriers None    Self-management support Doctor's office;Family    Other persons present Patient    Patient Concerns Nutrition/Meal planning;Weight Control;Glycemic Control;Healthy Lifestyle    Special Needs None    Preferred Learning Style No preference indicated    Learning Readiness Ready    How often do you need to have someone help you when you read instructions, pamphlets, or other written materials from your doctor or pharmacy? 1 - Never    What is the last grade level you completed in school? 12      Pre-Education Assessment   Patient understands the diabetes disease and treatment process. Needs Instruction    Patient understands incorporating nutritional management into lifestyle. Needs Instruction    Patient undertands incorporating physical activity into lifestyle. Needs Instruction    Patient understands using medications safely. Needs Instruction    Patient understands monitoring blood glucose, interpreting and using results Needs Instruction    Patient understands prevention, detection, and treatment of acute complications. Needs Instruction    Patient understands prevention, detection, and treatment of chronic complications. Needs Instruction  Patient understands how to develop strategies to address psychosocial issues. Needs Instruction    Patient understands how to develop strategies to promote health/change behavior. Needs Instruction      Complications   Last HgB A1C per patient/outside source 7.2 %   08/05/2021 increased from 6.6% 12/13/2020   How often do you check your blood sugar? 3-4 times/day    Fasting Blood glucose range (mg/dL) 130-179   145   Postprandial Blood glucose range (mg/dL) 180-200;>200;130-179   135-277    Number of hypoglycemic episodes per month 0    Have you had a dilated eye exam in the past 12 months? Yes    Have you had a dental exam in the past 12 months? Yes    Are you checking your feet? Yes    How many days per week are you checking your feet? 7      Dietary Intake   Breakfast Gravy biscuit this am OR cottage cheese, propell, fruit, 1/2 bagel with blueberry cream cheese OR scrambled egg, [place] bacon, [employer] toast or 1/2 biscuit OR tuna salad sandwich, handful of potato chips   10 am   Lunch [employer] Roast beef sandwich, no cheese, sauce, curley fries OR tuna sandwich    Snack (afternoon) fruit, peanuts    Dinner hamburger vegetable soup, crackers, peanuts or M&M's    Snack (evening) fruit OR handful of nuts OR oatmeal or multigrain cheerios with milk    Beverage(s) water, Glucerna Hunger, Body Armour Lite, 8 oz regular gingerale, Propel zero, decaf coffee with small amount regular creamer      Exercise   Exercise Type Light (walking / raking leaves)   joined local gym   How many days per week to you exercise? 0    How many minutes per day do you exercise? 0    Total minutes per week of exercise 0      Patient Education   Previous Diabetes Education No    Disease state  Definition of diabetes, type 1 and 2, and the diagnosis of diabetes    Nutrition management  Role of diet in the treatment of diabetes and the relationship between the three main macronutrients and blood glucose level;Food label reading, portion sizes and measuring food.;Carbohydrate counting;Meal options for control of blood glucose level and chronic complications.;Information on hints to eating out and maintain blood glucose control.;Reviewed blood glucose goals for pre and post meals and how to evaluate the patients' food intake on their blood glucose level.    Physical activity and exercise  Role of exercise on diabetes management, blood pressure control and cardiac health.;Helped patient identify appropriate exercises  in relation to his/her diabetes, diabetes complications and other health issue.    Medications Reviewed patients medication for diabetes, action, purpose, timing of dose and side effects.    Monitoring Purpose and frequency of SMBG.;Identified appropriate SMBG and/or A1C goals.;Daily foot exams;Yearly dilated eye exam    Acute complications Taught treatment of hypoglycemia - the 15 rule.;Discussed and identified patients' treatment of hyperglycemia.    Chronic complications Relationship between chronic complications and blood glucose control    Psychosocial adjustment Worked with patient to identify barriers to care and solutions;Role of stress on diabetes      Individualized Goals (developed by patient)   Nutrition Follow meal plan discussed    Physical Activity Exercise 3-5 times per week;15 minutes per day    Medications take my medication as prescribed    Monitoring  test my blood glucose as  discussed    Reducing Risk examine blood glucose patterns;increase portions of healthy fats    Health Coping discuss diabetes with (comment)      Post-Education Assessment   Patient understands the diabetes disease and treatment process. Demonstrates understanding / competency    Patient understands incorporating nutritional management into lifestyle. Needs Review    Patient undertands incorporating physical activity into lifestyle. Demonstrates understanding / competency    Patient understands using medications safely. Demonstrates understanding / competency    Patient understands monitoring blood glucose, interpreting and using results Demonstrates understanding / competency    Patient understands prevention, detection, and treatment of acute complications. Demonstrates understanding / competency    Patient understands prevention, detection, and treatment of chronic complications. Demonstrates understanding / competency    Patient understands how to develop strategies to address psychosocial issues.  Demonstrates understanding / competency    Patient understands how to develop strategies to promote health/change behavior. Needs Review      Outcomes   Expected Outcomes Demonstrated interest in learning. Expect positive outcomes    Future DMSE 2 months    Program Status Not Completed             Individualized Plan for Diabetes Self-Management Training:   Learning Objective:  Patient will have a greater understanding of diabetes self-management. Patient education plan is to attend individual and/or group sessions per assessed needs and concerns.   Plan:   Patient Instructions  Aim for 30 minutes of exercise most days. - gym, yoga, walking  Start slow and increase as tolerate  Continue to take your medication as prescribed Continue to be observant about your glucose readings. A meal may cause a jump of 40-60 points but not increasing is still fine.   Premeal to 2 hours post meal.  Calorie King app  Increase your non starchy vegetables Whole grains, greens, beans, and nuts are good sources of magnesium and potassium.     Expected Outcomes:  Demonstrated interest in learning. Expect positive outcomes  Education material provided: ADA - How to Thrive: A Guide for Your Journey with Diabetes, Food label handouts, Meal plan card, Snack sheet, and Diabetes Resources; dining out tips, fast food tips  If problems or questions, patient to contact team via:  Phone  Future DSME appointment: 2 months

## 2021-08-09 LAB — PTH, INTACT AND CALCIUM
Calcium: 9.4 mg/dL (ref 8.6–10.2)
PTH: 19 pg/mL (ref 16–77)

## 2021-08-09 MED ORDER — REPAGLINIDE 0.5 MG PO TABS: 0.5000 mg | ORAL_TABLET | Freq: Two times a day (BID) | ORAL | 11 refills | Status: DC

## 2021-09-11 NOTE — Progress Notes (Signed)
Referring Provider: Redmond School, MD Primary Care Physician:  Redmond School, MD Primary GI Physician: Dr. Gala Romney  Chief Complaint  Patient presents with   Gastroesophageal Reflux    Doing fairly well. Not taking carafate as often    HPI:   Heather Fry is a 45 y.o. female with GI history of GERD, IBS, and intermittent episodes of upper abdominal burning radiating into her chest with associated nausea without vomiting and diarrhea.  She has been evaluated in the emergency room several times for these intermittent episodes.  Labs have been unrevealing aside from intermittently elevated lipase up to 200 range.  This intermittent elevation dates back as far as 2019.  CT in April 2022 did not reveal any pancreatic abnormalities. LFTs have been normal aside from slight elevation in AST at 41 in June 2022. History of cholecystectomy.  Colonoscopy up-to-date in 2018, due for repeat in 2028.  EGD on file from November 2021 at [facility].  Per review of their records, this revealed gastritis, biopsies negative for H. pylori, fundic gland polyps.  She is presenting today for follow-up.  Last seen in our office 04/17/2021.  She continued to have intermittent flares of abdominal burning, nausea without vomiting, and diarrhea with most recent episode 2 weeks prior to office visit following steroid injection which she reported was a common trigger of her GI symptoms.  She denied BRBPR, melena, unintentional weight loss, NSAIDs, alcohol use.  At the time of her visit, she was doing very well.  She was taking Protonix twice daily and stated this always worked well for her as long as she ate correctly in conjunction with this, but admitted to a very poor diet as of late.  Overall, suspected intermittent symptoms likely secondary to GERD/gastritis and IBS flares probably triggered by poor dietary habits.  She reported she had just tarted a whole 30 diet and was already feeling much better.   Significance of elevated lipase previously was unclear, possibly secondary to gastritis or underlying PUD, unable to rule out occult pancreatic lesion.  Plan for CT with pancreatic protocol, continue Protonix twice daily, reinforced GERD diet/lifestyle, continue whole 30 diet, continue dicyclomine as needed for abdominal cramping or loose stools, follow-up in 4 to 6 months.  CT pancreatic protocol was completed 05/14/2021 with no pancreatic abnormalities.  She did have diffuse hepatic steatosis and a focal hyperdense nonenhancing nodularity along the posterior aspect of the right lobe of the liver measuring 11 mm that appeared stable at least since 2019 and consistent with a benign/indolent process.    Follow-up MRI abdomen with and without contrast 8/31 revealed 1 cm subcapsular hemorrhagic cyst in the posterior right hepatic lobe, moderate to severe hepatic steatosis, incompletely visualized cystic lesion in the upper right pelvis measuring 8 cm with inability to exclude ovarian neoplasm.  MRI pelvis with and without contrast completed 9/28 with large cystic lesion associated with the right ovary measuring up to 9.7 cm.  Differentials included borderline tumor or large cyst with involution of an adjacent smaller cyst.  Also with small cysts associated with the left ovary.  Recommended GYN consultation.  Patient had follow-up with GYN and ultimately underwent laparoscopic right oophorectomy on 08/21/2021.  Unable to see surgical pathology.  Today:  Feeling much better since having right ovarian cyst/ovary removed.  She reports being told that the cyst was growing about 1 cm/week and was growing up into her abdomen.  Since that time, her abdominal pain has essentially resolved.  Mild discomfort related to "healing".  Occasional bloating, but nothing significant.  She is working on weight loss, down about 9 pounds in the last 5 months.  No longer doing whole 30 as this caused her blood sugar to drop.  She  is just following a healthier diet and also going to Silver sneakers.  GERD remains well controlled on Protonix twice daily.  Uses Carafate as needed which works well, but does not need this very often.  Bowels are also moving very well.  Fairly rare use of Bentyl.  After having her surgery, she was placed on Colace for couple of days, but has since discontinued this.  Stools are soft and formed.  No BRBPR or melena.  Patient also brought her gap with her today who was referred to [place] as a new patient and is asking if we can get his appointment moved up.  He is currently scheduled at the end of March to see me; however, he is having diarrhea, weight loss, and some rectal bleeding.  I did discuss this with the front desk staff and have requested his appointment to be moved up. He was scheduled with Dr. Abbey Chatters on January 19th as this was the first available and patient had no preference between Dr. Gala Romney or Dr. Abbey Chatters.   Past Medical History:  Diagnosis Date   Allergic asthma    Anemia    Anxiety    Arthritis    Depression    Diabetes mellitus without complication (HCC)    Diverticulosis    Fatty liver    moderate to severe   GERD (gastroesophageal reflux disease)    Graves disease    Headache    tension, ocular migraines   Hypothyroidism    Internal hemorrhoids    Pneumonia    as an infant   Sleep apnea    Thyroid disease    Uterine fibroid     Past Surgical History:  Procedure Laterality Date   ABDOMINAL HYSTERECTOMY     Per patient, uterine fibroids.   ABDOMINAL HYSTERECTOMY     CARPAL TUNNEL RELEASE Right 06/11/2021   Procedure: Right Carpel tunnel release;  Surgeon: Vallarie Mare, MD;  Location: [place];  Service: Neurosurgery;  Laterality: Right;  RM 18   CHOLECYSTECTOMY N/A 11/06/2014   Procedure: LAPAROSCOPIC CHOLECYSTECTOMY;  Surgeon: Jamesetta So, MD;  Location: [facility];  Service: General;  Laterality: N/A;   COLONOSCOPY N/A 03/04/2017   Dr. Gala Romney: Hemorrhoids,  diverticulosis, benign polyp without adenomatous changes.  Next colonoscopy 10 years   DILATION AND CURETTAGE OF UTERUS     ESOPHAGOGASTRODUODENOSCOPY N/A 10/02/2014   Dr. Gala Romney: fundic gland polyps, hiatal hernia   ESOPHAGOGASTRODUODENOSCOPY  07/2020   [facility] ; normal esophagus biopsied, benign-appearing gastric polyps biopsied, gastric biopsies obtained to evaluate for H. pylori, normal duodenum.  Pathology with mild chronic gastritis, negative for H. pylori, fundic gland polyps, esophageal biopsy benign.   MOUTH SURGERY     extraction of teeth   POLYPECTOMY  03/04/2017   Procedure: POLYPECTOMY;  Surgeon: Daneil Dolin, MD;  [facility];  Service: Endoscopy;;  colon   THYROIDECTOMY N/A 09/30/2018   Procedure: TOTAL THYROIDECTOMY;  Surgeon: Armandina Gemma, MD;  Location: [facility];  Service: General;  Laterality: N/A;   TUBAL LIGATION      Current Outpatient Medications  Medication Sig Dispense Refill   acarbose (PRECOSE) 50 MG tablet Take 1 tablet (50 mg total) by mouth 3 (three) times daily with  meals. 270 tablet 3   acetaminophen (TYLENOL) 500 MG tablet Take 1,000 mg by mouth daily as needed for moderate pain.     albuterol (VENTOLIN HFA) 108 (90 Base) MCG/ACT inhaler Inhale 2 puffs into the lungs every 6 (six) hours as needed. (Patient taking differently: Inhale 2 puffs into the lungs every 6 (six) hours as needed for wheezing or shortness of breath.) 18 g 11   cetirizine (ZYRTEC) 10 MG tablet Take 10 mg by mouth daily.     Cholecalciferol (VITAMIN D3) 125 MCG (5000 UT) CAPS Take 1 capsule (5,000 Units total) by mouth daily. (Patient taking differently: Take 10,000 Units by mouth daily.) 30 capsule 11   dicyclomine (BENTYL) 10 MG capsule Take 1 capsule (10 mg total) by mouth up to 3 times daily before meals and at bedtime for abdominal cramping or diarrhea.  Hold in the setting of constipation. (Patient taking differently: as needed. Take 1 capsule (10 mg total) by  mouth up to 3 times daily before meals and at bedtime for abdominal cramping or diarrhea.  Hold in the setting of constipation.) 90 capsule 0   EPINEPHrine 0.3 mg/0.3 mL IJ SOAJ injection Inject 0.3 mg into the muscle as needed for anaphylaxis.     escitalopram (LEXAPRO) 20 MG tablet Take 1 tablet (20 mg total) by mouth at bedtime. 30 tablet 5   fluticasone (FLONASE) 50 MCG/ACT nasal spray Place 2 sprays into both nostrils at bedtime.     glucose blood (ACCU-CHEK AVIVA PLUS) test strip 1 each by Other route daily. And lancets 1/day 100 each 3   ipratropium (ATROVENT) 0.02 % nebulizer solution Take 0.5 mg by nebulization as needed.     LIDOCAINE-MENTHOL ROLL-ON EX Apply 1 application topically daily as needed (pain).     Magnesium 250 MG TABS Take 250 mg by mouth at bedtime.      NON FORMULARY at bedtime. CPAP at night     nystatin cream (MYCOSTATIN) Apply 1 application topically daily as needed (toe fungus).     ondansetron (ZOFRAN ODT) 4 MG disintegrating tablet Take 1 tablet (4 mg total) by mouth every 8 (eight) hours as needed for nausea or vomiting. 20 tablet 0   pantoprazole (PROTONIX) 40 MG tablet Take 1 tablet (40 mg total) by mouth 2 (two) times daily. 180 tablet 3   RESTASIS 0.05 % ophthalmic emulsion Place 1 drop into both eyes 2 (two) times daily.     Simethicone 250 MG CAPS Take 500 mg by mouth at bedtime.     sucralfate (CARAFATE) 1 g tablet Take 1 g by mouth as needed.     SYNTHROID 150 MCG tablet Take 150 mcg by mouth daily.     No current facility-administered medications for this visit.    Allergies as of 09/12/2021 - Review Complete 09/12/2021  Allergen Reaction Noted   Levofloxacin Other (See Comments) 03/03/2017   Toradol [ketorolac tromethamine] Anaphylaxis and Hives 06/08/2019   Tramadol Hives 01/11/2021   Levomenol  03/06/2020   Other Other (See Comments) 12/17/2015   Chamomile Nausea And Vomiting 03/06/2020   Hctz [hydrochlorothiazide] Other (See Comments)  06/22/2020   Lisinopril Nausea Only 01/18/2018   Losartan potassium Other (See Comments) 06/22/2020   Penicillins Other (See Comments) 04/06/2012   Triamcinolone acetonide  01/02/2021    Family History  Problem Relation Age of Onset   Uterine cancer Mother        ?cervical cancer   Hypertension Father    Diabetes Father  Uterine cancer Sister        suicide   Colon cancer Paternal Aunt 57   Depression Other    Thyroid disease Neg Hx    Pancreatic cancer Neg Hx     Social History   Socioeconomic History   Marital status: Married    Spouse name: Not on file   Number of children: Not on file   Years of education: Not on file   Highest education level: Not on file  Occupational History   Not on file  Tobacco Use   Smoking status: Former    Packs/day: 1.00    Years: 5.00    Pack years: 5.00    Types: Cigarettes    Quit date: 11/03/1997    Years since quitting: 23.8   Smokeless tobacco: Never   Tobacco comments:    1 pack 1 week  Vaping Use   Vaping Use: Never used  Substance and Sexual Activity   Alcohol use: No    Alcohol/week: 0.0 standard drinks   Drug use: No   Sexual activity: Yes    Birth control/protection: Surgical  Other Topics Concern   Not on file  Social History Narrative   Not on file   Social Determinants of Health   Financial Resource Strain: Not on file  Food Insecurity: Not on file  Transportation Needs: Not on file  Physical Activity: Not on file  Stress: Not on file  Social Connections: Not on file    Review of Systems: Gen: Denies fever, chills, cold or flulike symptoms, presyncope, syncope. CV: Denies chest pain, palpitations. Resp: Denies dyspnea or cough. GI: See HPI Heme: See HPI  Physical Exam: BP 133/79    Pulse 91    Temp 97.8 F (36.6 C) (Temporal)    Ht 5\' 4"  (1.626 m)    Wt 257 lb 6.4 oz (116.8 kg)    LMP 10/09/2014    BMI 44.18 kg/m  General:   Alert and oriented. No distress noted. Pleasant and cooperative.  Head:   Normocephalic and atraumatic. Eyes:  Conjuctiva clear without scleral icterus. Heart:  S1, S2 present without murmurs appreciated. Lungs:  Clear to auscultation bilaterally. No wheezes, rales, or rhonchi. No distress.  Abdomen:  +BS, obese, soft, non-tender. No rebound or guarding. No HSM or masses noted.  Small, closed, healing incisions near his umbilicus and in left and right lower quadrant. Msk:  Symmetrical without gross deformities. Normal posture. Extremities:  Without edema. Neurologic:  Alert and  oriented x4 Psych:  Normal mood and affect.    Assessment: 45 y.o. female with history of Graves' disease (s/p thyroidectomy 2020), OSA (on CPAP), HLD, GERD, gastritis, IBS, abdominal pain presenting today for follow-up.   Patient had previously been having intermittent flares of burning epigastric abdominal pain radiating to her chest, nausea without vomiting, and diarrhea.  Multiple prior evaluations in the emergency room that were unrevealing aside from elevated lipase which is nonspecific.  Follow-up CT pancreatic protocol in August with no pancreatic abnormalities.  Symptoms are now much improved/essentially resolved with dietary changes and weight loss. Notably, since patient's last visit, she also underwent right laparoscopic oophorectomy and cyst removal which she also reports significantly improved her prior abdominal pain. GERD continues to be well controlled on Protonix BID and Carafate PRN for breakthrough. Using Bentyl sparingly for occasional diarrhea. Overall, bowels have returned to normal. No alarm symptoms. Colonoscopy up-to-date in 2018, due for repeat in 2028.  EGD on file from November 2021 at [facility].  Per review of their records, this revealed gastritis, biopsies negative for H. pylori, fundic gland polyps.    Overall, I suspect her intermittent flares were related to GERD/gastritis and IBS in the setting of dietary indiscretion.   Plan: Continue  Protonix 40 mg twice daily. Reinforced GERD diet/lifestyle. Continue dicyclomine as needed for diarrhea or abdominal cramping. Okay to resume Benefiber.  Recommended 2-3 teaspoons daily x2 weeks then increase to twice daily if well-tolerated. Congratulated on weight loss efforts thus far and encouraged her to continue with this. Follow-up in 6 months or sooner if needed.   Aliene Altes, PA-C [facility] 09/12/2021

## 2021-09-12 ENCOUNTER — Ambulatory Visit (INDEPENDENT_AMBULATORY_CARE_PROVIDER_SITE_OTHER): Payer: Medicaid Other | Admitting: Gastroenterology

## 2021-09-12 ENCOUNTER — Encounter: Payer: Self-pay | Admitting: Gastroenterology

## 2021-09-12 ENCOUNTER — Other Ambulatory Visit: Payer: Self-pay

## 2021-09-12 VITALS — BP 133/79 | HR 91 | Temp 97.8°F | Ht 64.0 in | Wt 257.4 lb

## 2021-09-12 DIAGNOSIS — K219 Gastro-esophageal reflux disease without esophagitis: Secondary | ICD-10-CM | POA: Diagnosis not present

## 2021-09-12 DIAGNOSIS — K580 Irritable bowel syndrome with diarrhea: Secondary | ICD-10-CM

## 2021-09-12 NOTE — Patient Instructions (Addendum)
Continue taking Protonix 40 mg twice daily 30 minutes before breakfast and dinner.  Follow a GERD diet:  Avoid fried, fatty, greasy, spicy, citrus foods. Avoid caffeine and carbonated beverages. Avoid chocolate. Try eating 4-6 small meals a day rather than 3 large meals. Do not eat within 3 hours of laying down. Prop head of bed up on wood or bricks to create a 6 inch incline.  Continue using dicyclomine as needed for diarrhea or abdominal cramping.  As we discussed, you can resume Benefiber.  You can start with 2-3 teaspoons daily x2 weeks, then increase to twice daily if well-tolerated.    Congratulations on your weight loss thus far!  You are doing a great job!  Please continue with your efforts.   It was great to see you again today!  I am glad you are feeling much better!  I hope you have a very Merry Christmas!  Aliene Altes, PA-C [facility]

## 2021-10-01 ENCOUNTER — Encounter: Payer: Self-pay | Admitting: Urology

## 2021-10-01 ENCOUNTER — Other Ambulatory Visit: Payer: Self-pay

## 2021-10-01 ENCOUNTER — Ambulatory Visit (INDEPENDENT_AMBULATORY_CARE_PROVIDER_SITE_OTHER): Payer: Medicaid Other | Admitting: Urology

## 2021-10-01 VITALS — BP 157/91 | HR 106 | Wt 257.0 lb

## 2021-10-01 DIAGNOSIS — N3946 Mixed incontinence: Secondary | ICD-10-CM | POA: Diagnosis not present

## 2021-10-01 LAB — URINALYSIS, ROUTINE W REFLEX MICROSCOPIC
Bilirubin, UA: NEGATIVE
Glucose, UA: NEGATIVE
Ketones, UA: NEGATIVE
Leukocytes,UA: NEGATIVE
Nitrite, UA: NEGATIVE
Protein,UA: NEGATIVE
RBC, UA: NEGATIVE
Specific Gravity, UA: 1.02 (ref 1.005–1.030)
Urobilinogen, Ur: 0.2 mg/dL (ref 0.2–1.0)
pH, UA: 7 (ref 5.0–7.5)

## 2021-10-01 LAB — BLADDER SCAN AMB NON-IMAGING: Scan Result: 0

## 2021-10-01 MED ORDER — MIRABEGRON ER 50 MG PO TB24: 50.0000 mg | ORAL_TABLET | Freq: Every day | ORAL | 0 refills | Status: DC

## 2021-10-01 NOTE — Progress Notes (Signed)
post void residual =0  Urological Symptom Review  Patient is experiencing the following symptoms: Frequent urination Leakage of urine   Review of Systems  Gastrointestinal (upper)  : Nausea Indigestion/heartburn  Gastrointestinal (lower) : Diarrhea Constipation  Constitutional : Fatigue  Skin: Negative for skin symptoms  Eyes: Negative for eye symptoms  Ear/Nose/Throat : Sinus problems  Hematologic/Lymphatic: Negative for Hematologic/Lymphatic symptoms  Cardiovascular : Negative for cardiovascular symptoms  Respiratory : Cough Shortness of breath  Endocrine: Negative for endocrine symptoms  Musculoskeletal: Back pain Joint pain  Neurological: Negative for neurological symptoms  Psychologic: Depression Anxiety

## 2021-10-01 NOTE — Progress Notes (Signed)
Assessment: 1. Mixed incontinence     Plan: Diagnosis and management of stress and urge incontinence discussed with the patient in detail today.  Options for management of stress incontinence including pelvic floor exercises, biofeedback, pessaries, and surgical management reviewed.  Options for management of urge incontinence including avoidance of dietary irritants, behavioral modification, medical therapy, neuromodulation, and chemodenervation reviewed. Instructions on pelvic floor exercises provided Trial of Myrbetriq 25 mg daily x 1 week, then increase to 50 mg daily x 3 weeks.  Samples provided.  Use and side effects discussed. Return to office in 1 month  Chief Complaint:  Chief Complaint  Patient presents with   Urinary Incontinence    History of Present Illness:  Heather Fry is a 46 y.o. year old female who is seen in consultation from Redmond School, MD  for evaluation of urinary incontinence.  She has had symptoms for at least 3 years.  She reports symptoms of urinary frequency, urgency, and nocturia 1-2 times.  She also has incontinence associated with urgency.  She does report some stress incontinence as well.  She is using 2-3 always pads per day.  No dysuria or gross hematuria.  No recent UTIs.  She is not sure if she empties her bladder completely.  No prior medical therapy for her symptoms. She is status post a partial hysterectomy and bladder suspension several years ago.  She recently underwent a right salpingo-oophorectomy.   Past Medical History:  Past Medical History:  Diagnosis Date   Allergic asthma    Anemia    Anxiety    Arthritis    Depression    Diabetes mellitus without complication (HCC)    Diverticulosis    Fatty liver    moderate to severe   GERD (gastroesophageal reflux disease)    Graves disease    Headache    tension, ocular migraines   Hypothyroidism    Internal hemorrhoids    Pneumonia    as an infant   Sleep apnea    Thyroid  disease    Uterine fibroid     Past Surgical History:  Past Surgical History:  Procedure Laterality Date   ABDOMINAL HYSTERECTOMY     Per patient, uterine fibroids.   ABDOMINAL HYSTERECTOMY     CARPAL TUNNEL RELEASE Right 06/11/2021   Procedure: Right Carpel tunnel release;  Surgeon: Vallarie Mare, MD;  Location: [place];  Service: Neurosurgery;  Laterality: Right;  RM 18   CHOLECYSTECTOMY N/A 11/06/2014   Procedure: LAPAROSCOPIC CHOLECYSTECTOMY;  Surgeon: Jamesetta So, MD;  Location: [facility];  Service: General;  Laterality: N/A;   COLONOSCOPY N/A 03/04/2017   Dr. Gala Romney: Hemorrhoids, diverticulosis, benign polyp without adenomatous changes.  Next colonoscopy 10 years   DILATION AND CURETTAGE OF UTERUS     ESOPHAGOGASTRODUODENOSCOPY N/A 10/02/2014   Dr. Gala Romney: fundic gland polyps, hiatal hernia   ESOPHAGOGASTRODUODENOSCOPY  07/2020   [facility] ; normal esophagus biopsied, benign-appearing gastric polyps biopsied, gastric biopsies obtained to evaluate for H. pylori, normal duodenum.  Pathology with mild chronic gastritis, negative for H. pylori, fundic gland polyps, esophageal biopsy benign.   MOUTH SURGERY     extraction of teeth   POLYPECTOMY  03/04/2017   Procedure: POLYPECTOMY;  Surgeon: Daneil Dolin, MD;  [facility];  Service: Endoscopy;;  colon   THYROIDECTOMY N/A 09/30/2018   Procedure: TOTAL THYROIDECTOMY;  Surgeon: Armandina Gemma, MD;  Location: [facility];  Service: General;  Laterality: N/A;   TUBAL LIGATION  Allergies:  Allergies  Allergen Reactions   Levofloxacin Other (See Comments)    Pt can not have due to taking Lexapro    Toradol [Ketorolac Tromethamine] Anaphylaxis and Hives   Tramadol Hives   Levomenol     Other reaction(s): Other   Other Other (See Comments)   Chamomile Nausea And Vomiting    Lexapro   Hctz [Hydrochlorothiazide] Other (See Comments)    Dehydration   Lisinopril Nausea Only   Losartan Potassium Other (See  Comments)    GI upset   Penicillins Other (See Comments)    Loopy, childhood allergy     Triamcinolone Acetonide     Elevated blood pressure, caused diabetes, changed tyroid electrolytes Patient has no tyroid      Family History:  Family History  Problem Relation Age of Onset   Uterine cancer Mother        ?cervical cancer   Hypertension Father    Diabetes Father    Uterine cancer Sister        suicide   Colon cancer Paternal Aunt 40   Depression Other    Thyroid disease Neg Hx    Pancreatic cancer Neg Hx     Social History:  Social History   Tobacco Use   Smoking status: Former    Packs/day: 1.00    Years: 5.00    Pack years: 5.00    Types: Cigarettes    Quit date: 11/03/1997    Years since quitting: 23.9   Smokeless tobacco: Never   Tobacco comments:    1 pack 1 week  Vaping Use   Vaping Use: Never used  Substance Use Topics   Alcohol use: No    Alcohol/week: 0.0 standard drinks   Drug use: No    Review of symptoms:  Constitutional:  Negative for unexplained weight loss, night sweats, fever, chills ENT:  Negative for nose bleeds, sinus pain, painful swallowing CV:  Negative for chest pain, shortness of breath, exercise intolerance, palpitations, loss of consciousness Resp:  Negative for cough, wheezing, shortness of breath GI:  Negative for nausea, vomiting, diarrhea, bloody stools GU:  Positives noted in HPI; otherwise negative for gross hematuria, dysuria Neuro:  Negative for seizures, poor balance, limb weakness, slurred speech Psych:  Negative for lack of energy, depression, anxiety Endocrine:  Negative for polydipsia, polyuria, symptoms of hypoglycemia (dizziness, hunger, sweating) Hematologic:  Negative for anemia, purpura, petechia, prolonged or excessive bleeding, use of anticoagulants  Allergic:  Negative for difficulty breathing or choking as a result of exposure to anything; no shellfish allergy; no allergic response (rash/itch) to materials,  foods  Physical exam: BP (!) 157/91    Pulse (!) 106    Wt 257 lb (116.6 kg)    LMP 10/09/2014    BMI 44.11 kg/m  GENERAL APPEARANCE:  Well appearing, well developed, well nourished, NAD HEENT: Atraumatic, Normocephalic, oropharynx clear. NECK: Supple without lymphadenopathy or thyromegaly. LUNGS: Clear to auscultation bilaterally. HEART: Regular Rate and Rhythm without murmurs, gallops, or rubs. ABDOMEN: Soft, non-tender, No Masses. EXTREMITIES: Moves all extremities well.  Without clubbing, cyanosis, or edema. NEUROLOGIC:  Alert and oriented x 3, normal gait, CN II-XII grossly intact.  MENTAL STATUS:  Appropriate. BACK:  Non-tender to palpation.  No CVAT SKIN:  Warm, dry and intact.    Results: U/A:  dipstick negative  PVR = 0 ml

## 2021-10-04 ENCOUNTER — Encounter: Payer: Medicaid Other | Attending: Endocrinology | Admitting: Dietician

## 2021-10-04 ENCOUNTER — Other Ambulatory Visit: Payer: Self-pay

## 2021-10-04 ENCOUNTER — Ambulatory Visit (INDEPENDENT_AMBULATORY_CARE_PROVIDER_SITE_OTHER): Payer: Medicaid Other | Admitting: Endocrinology

## 2021-10-04 ENCOUNTER — Encounter: Payer: Self-pay | Admitting: Dietician

## 2021-10-04 VITALS — BP 146/86 | HR 92 | Ht 64.0 in | Wt 253.8 lb

## 2021-10-04 DIAGNOSIS — E119 Type 2 diabetes mellitus without complications: Secondary | ICD-10-CM | POA: Insufficient documentation

## 2021-10-04 LAB — BASIC METABOLIC PANEL
BUN: 10 mg/dL (ref 6–23)
CO2: 26 mEq/L (ref 19–32)
Calcium: 9 mg/dL (ref 8.4–10.5)
Chloride: 101 mEq/L (ref 96–112)
Creatinine, Ser: 0.79 mg/dL (ref 0.40–1.20)
GFR: 90.22 mL/min (ref >60.00)
Glucose, Bld: 192 mg/dL — ABNORMAL HIGH (ref 70–99)
Potassium: 4.1 mEq/L (ref 3.5–5.1)
Sodium: 138 mEq/L (ref 135–145)

## 2021-10-04 LAB — TSH: TSH: 1.42 u[IU]/mL (ref 0.35–5.50)

## 2021-10-04 LAB — T4, FREE: Free T4: 0.83 ng/dL (ref 0.60–1.60)

## 2021-10-04 NOTE — Patient Instructions (Signed)
Blood tests are requested for you today.  We'll let you know about the results.  Please come back for a follow-up appointment in 3 months.   

## 2021-10-04 NOTE — Patient Instructions (Signed)
Continue to stay active. Continue the great changes that you have made! Mindfulness with snack choices and eating out.

## 2021-10-04 NOTE — Progress Notes (Signed)
Subjective:    Patient ID: Heather Fry, female    DOB: 19-Apr-1976, 46 y.o.   MRN: [id number]  HPI Pt returns for f/u of postsurgical hypothyroidism (she had thyroidectomy 1/20, for hyperthyroidism; she was rx'ed synthroid soon thereafter).  She takes synthroid as rx'ed.   She also has mild postsurgical hypoparathyroidism (she takes Vit-D, 10000 units/day).   She also has hypomagnesemia (she still takes 250 mg qd).   She has chronic sxs: these have been normal: serum catechols, cortisol, and FSH/LH; she has had TAH but not BSO.    B-12 def: she stopped rx, due to high level.  Type 2 DM (dx'ed 2022; she takes acarbose 100 mg 3 times a day (just before each meal).  She has not recently used continuous glucose monitor.  Glucose varies from 90-200.  It is in general lowest in the afternoon.  No recent steroids.  She could not tolerate repaglinide (nausea) or amaryl (hypoglycemia). She cannot take piog;litazone (edema).   Past Medical History:  Diagnosis Date   Allergic asthma    Anemia    Anxiety    Arthritis    Depression    Diabetes mellitus without complication (HCC)    Diverticulosis    Fatty liver    moderate to severe   GERD (gastroesophageal reflux disease)    Graves disease    Headache    tension, ocular migraines   Hypothyroidism    Internal hemorrhoids    Pneumonia    as an infant   Sleep apnea    Thyroid disease    Uterine fibroid     Past Surgical History:  Procedure Laterality Date   ABDOMINAL HYSTERECTOMY     Per patient, uterine fibroids.   ABDOMINAL HYSTERECTOMY     CARPAL TUNNEL RELEASE Right 06/11/2021   Procedure: Right Carpel tunnel release;  Surgeon: Vallarie Mare, MD;  Location: [facility];  Service: Neurosurgery;  Laterality: Right;  RM 18   CHOLECYSTECTOMY N/A 11/06/2014   Procedure: LAPAROSCOPIC CHOLECYSTECTOMY;  Surgeon: Jamesetta So, MD;  Location: [facility];  Service: General;  Laterality: N/A;   COLONOSCOPY N/A 03/04/2017   Dr. Gala Romney:  Hemorrhoids, diverticulosis, benign polyp without adenomatous changes.  Next colonoscopy 10 years   DILATION AND CURETTAGE OF UTERUS     ESOPHAGOGASTRODUODENOSCOPY N/A 10/02/2014   Dr. Gala Romney: fundic gland polyps, hiatal hernia   ESOPHAGOGASTRODUODENOSCOPY  07/2020   [facility] ; normal esophagus biopsied, benign-appearing gastric polyps biopsied, gastric biopsies obtained to evaluate for H. pylori, normal duodenum.  Pathology with mild chronic gastritis, negative for H. pylori, fundic gland polyps, esophageal biopsy benign.   MOUTH SURGERY     extraction of teeth   POLYPECTOMY  03/04/2017   Procedure: POLYPECTOMY;  Surgeon: Daneil Dolin, MD;  Location: [facility];  Service: Endoscopy;;  colon   THYROIDECTOMY N/A 09/30/2018   Procedure: TOTAL THYROIDECTOMY;  Surgeon: Armandina Gemma, MD;  Location: [facility];  Service: General;  Laterality: N/A;   TUBAL LIGATION      Social History   Socioeconomic History   Marital status: Married    Spouse name: Not on file   Number of children: Not on file   Years of education: Not on file   Highest education level: Not on file  Occupational History   Not on file  Tobacco Use   Smoking status: Former    Packs/day: 1.00    Years: 5.00    Pack years: 5.00    Types: Cigarettes  Quit date: 11/03/1997    Years since quitting: 23.9   Smokeless tobacco: Never   Tobacco comments:    1 pack 1 week  Vaping Use   Vaping Use: Never used  Substance and Sexual Activity   Alcohol use: No    Alcohol/week: 0.0 standard drinks   Drug use: No   Sexual activity: Yes    Birth control/protection: Surgical  Other Topics Concern   Not on file  Social History Narrative   Not on file   Social Determinants of Health   Financial Resource Strain: Not on file  Food Insecurity: Not on file  Transportation Needs: Not on file  Physical Activity: Not on file  Stress: Not on file  Social Connections: Not on file  Intimate Partner Violence: Not on file     Current Outpatient Medications on File Prior to Visit  Medication Sig Dispense Refill   acarbose (PRECOSE) 50 MG tablet Take 1 tablet (50 mg total) by mouth 3 (three) times daily with meals. 270 tablet 3   acetaminophen (TYLENOL) 500 MG tablet Take 1,000 mg by mouth daily as needed for moderate pain.     albuterol (VENTOLIN HFA) 108 (90 Base) MCG/ACT inhaler Inhale 2 puffs into the lungs every 6 (six) hours as needed. (Patient taking differently: Inhale 2 puffs into the lungs every 6 (six) hours as needed for wheezing or shortness of breath.) 18 g 11   cetirizine (ZYRTEC) 10 MG tablet Take 10 mg by mouth daily.     Cholecalciferol (VITAMIN D3) 125 MCG (5000 UT) CAPS Take 1 capsule (5,000 Units total) by mouth daily. (Patient taking differently: Take 10,000 Units by mouth daily.) 30 capsule 11   dicyclomine (BENTYL) 10 MG capsule Take 1 capsule (10 mg total) by mouth up to 3 times daily before meals and at bedtime for abdominal cramping or diarrhea.  Hold in the setting of constipation. (Patient taking differently: as needed. Take 1 capsule (10 mg total) by mouth up to 3 times daily before meals and at bedtime for abdominal cramping or diarrhea.  Hold in the setting of constipation.) 90 capsule 0   EPINEPHrine 0.3 mg/0.3 mL IJ SOAJ injection Inject 0.3 mg into the muscle as needed for anaphylaxis.     escitalopram (LEXAPRO) 20 MG tablet Take 1 tablet (20 mg total) by mouth at bedtime. 30 tablet 5   fluticasone (FLONASE) 50 MCG/ACT nasal spray Place 2 sprays into both nostrils at bedtime.     glucose blood (ACCU-CHEK AVIVA PLUS) test strip 1 each by Other route daily. And lancets 1/day 100 each 3   ipratropium (ATROVENT) 0.02 % nebulizer solution Take 0.5 mg by nebulization as needed.     LIDOCAINE-MENTHOL ROLL-ON EX Apply 1 application topically daily as needed (pain).     Magnesium 250 MG TABS Take 250 mg by mouth at bedtime.      mirabegron ER (MYRBETRIQ) 50 MG TB24 tablet Take 1 tablet (50 mg  total) by mouth daily. 28 tablet 0   NON FORMULARY at bedtime. CPAP at [facility]     nystatin cream (MYCOSTATIN) Apply 1 application topically daily as needed (toe fungus).     ondansetron (ZOFRAN ODT) 4 MG disintegrating tablet Take 1 tablet (4 mg total) by mouth every 8 (eight) hours as needed for nausea or vomiting. 20 tablet 0   pantoprazole (PROTONIX) 40 MG tablet Take 1 tablet (40 mg total) by mouth 2 (two) times daily. 180 tablet 3   RESTASIS 0.05 % ophthalmic emulsion  Place 1 drop into both eyes 2 (two) times daily.     Simethicone 250 MG CAPS Take 500 mg by mouth at bedtime.     sucralfate (CARAFATE) 1 g tablet Take 1 g by mouth as needed.     SYNTHROID 150 MCG tablet Take 150 mcg by mouth daily.     No current facility-administered medications on file prior to visit.    Allergies  Allergen Reactions   Levofloxacin Other (See Comments)    Pt can not have due to taking Lexapro    Toradol [Ketorolac Tromethamine] Anaphylaxis and Hives   Tramadol Hives   Levomenol     Other reaction(s): Other   Other Other (See Comments)   Chamomile Nausea And Vomiting    Lexapro   Hctz [Hydrochlorothiazide] Other (See Comments)    Dehydration   Lisinopril Nausea Only   Losartan Potassium Other (See Comments)    GI upset   Penicillins Other (See Comments)    Loopy, childhood allergy     Triamcinolone Acetonide     Elevated blood pressure, caused diabetes, changed tyroid electrolytes Patient has no tyroid      Family History  Problem Relation Age of Onset   Uterine cancer Mother        ?cervical cancer   Hypertension Father    Diabetes Father    Uterine cancer Sister        suicide   Colon cancer Paternal Aunt 85   Depression Other    Thyroid disease Neg Hx    Pancreatic cancer Neg Hx     BP (!) 146/86    Pulse 92    Ht 5\' 4"  (1.626 m)    Wt 253 lb 12.8 oz (115.1 kg)    LMP 10/09/2014    SpO2 97%    BMI 43.56 kg/m    Review of Systems She has lost a few lbs--intentional.  She  denies diarrhea.      Objective:   Physical Exam VITAL SIGNS:  See vs page GENERAL: no distress Ext: trace bilat leg edema.  Lab Results  Component Value Date   PTH 19 08/08/2021   CALCIUM 9.4 08/08/2021   CALCIUM 9.1 08/08/2021   CAION 1.25 01/19/2020   PHOS 4.0 05/30/2021       Assessment & Plan:  Type 2 DM: stable. Please continue the same acarbose Hypoparathyroidism: recheck labs today

## 2021-10-04 NOTE — Progress Notes (Signed)
Diabetes Self-Management Education  Visit Type: Follow-up  Appt. Start Time: 1325 Appt. End Time: 5643  10/04/2021  [name], identified by name and date of birth, is a 46 y.o. female with a diagnosis of Diabetes:  .   ASSESSMENT Patient is here today with her husband. She has lost weight. She has started going to [employer] but currently stopped due to surgery to remove ovary.  She has been pushing herself to walk and move more.  Walks with a cane.  She states that she is getting a cart soon to use in large stores.  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 States that she is feeling better and more energy. States that her magnesium and potassium are better.   She is drinking zero sugar drinks Less fried foods. Cucumber or broccoli daily More consistent with counting carbohydrates and reading labels. She is making better snack choices after reading labels Uses Hershey Kisses at times to bring sugar up.  She is not used to her blood glucose in the normal range. Average glucose 143 (before and after meals).  She checks her blood glucose frequently.    History includes Type 2 diabetes (11/2020), Graves disease (thyroid removed - September 30, 2018), OSA on c-pap Mike Craze - currently not using as she feels this was inaccurate Labs include:  A1C 6.6% 12/13/2020 and increased to 6.2% 08/05/2021, GFR>60 08/06/2021 Medications include Acarbose   Weight hx: 253 lbs 10/04/2021 259 lbs 08/08/2021 268 lbs  about 07/18/2021 per patient Reports that she has lost weight due to moving more. 157 lbs 09/2018 prior to thyroid removal   Patient lives with her husband and 5 yo as well as her husband's mother.  Daughter is home schooled.  They share shopping and cooking. She gets food stamps but they do not last the month. Her father is 74 yo and just moved here from [place] and he will help as needed. Patient's husband has diabetes and patient changed his diet and he was able to get off  of insulin. Enjoys crocheting which helps her relax.  Weight 253 lb (114.8 kg), last menstrual period 10/09/2014. Body mass index is 43.43 kg/m.   Diabetes Self-Management Education - 10/04/21 1421       Visit Information   Visit Type Follow-up      Psychosocial Assessment   Self-care barriers None    Self-management support Doctor's office;CDE visits;Family    Other persons present Patient;Spouse/SO    Patient Concerns Healthy Lifestyle;Glycemic Control;Weight Control;Nutrition/Meal planning    Special Needs None    Preferred Learning Style No preference indicated    Learning Readiness Ready    How often do you need to have someone help you when you read instructions, pamphlets, or other written materials from your doctor or pharmacy? 1 - Never    What is the last grade level you completed in school? 12      Pre-Education Assessment   Patient understands the diabetes disease and treatment process. Demonstrates understanding / competency    Patient understands incorporating nutritional management into lifestyle. Needs Review    Patient undertands incorporating physical activity into lifestyle. Demonstrates understanding / competency    Patient understands using medications safely. Demonstrates understanding / competency    Patient understands monitoring blood glucose, interpreting and using results Demonstrates understanding / competency    Patient understands prevention, detection, and treatment of acute complications. Demonstrates understanding / competency    Patient understands prevention, detection, and treatment of chronic  complications. Demonstrates understanding / competency    Patient understands how to develop strategies to address psychosocial issues. Demonstrates understanding / competency    Patient understands how to develop strategies to promote health/change behavior. Needs Review      Complications   How often do you check your blood sugar? 3-4 times/day     Fasting Blood glucose range (mg/dL) 130-179;70-129   100-150   Number of hypoglycemic episodes per month 0      Dietary Intake   Breakfast skipped this am as she slept in OR steak and bagel sandwich from [employer]    Lunch Fried chicken sandwich with fries    Dinner frozen pizza, cucumbers    Snack (evening) Jalopeno poppers    Beverage(s) water, body armour lite, zero drinks, decaf coffee with small amount regular creamer      Exercise   Exercise Type Light (walking / raking leaves)      Patient Education   Previous Diabetes Education Yes (please comment)   07/2021   Nutrition management  Meal options for control of blood glucose level and chronic complications.;Information on hints to eating out and maintain blood glucose control.    Physical activity and exercise  Role of exercise on diabetes management, blood pressure control and cardiac health.    Medications Reviewed patients medication for diabetes, action, purpose, timing of dose and side effects.    Psychosocial adjustment Worked with patient to identify barriers to care and solutions;Role of stress on diabetes;Identified and addressed patients feelings and concerns about diabetes      Individualized Goals (developed by patient)   Nutrition General guidelines for healthy choices and portions discussed    Physical Activity Exercise 3-5 times per week;15 minutes per day    Medications take my medication as prescribed    Monitoring  test my blood glucose as discussed    Reducing Risk examine blood glucose patterns;increase portions of healthy fats      Patient Self-Evaluation of Goals - Patient rates self as meeting previously set goals (% of time)   Nutrition 50 - 75 %    Physical Activity 50 - 75 %    Medications >75%    Monitoring >75%    Problem Solving >75%    Reducing Risk 50 - 75 %    Health Coping 50 - 75 %      Post-Education Assessment   Patient understands the diabetes disease and treatment process. Demonstrates  understanding / competency    Patient understands incorporating nutritional management into lifestyle. Needs Review    Patient undertands incorporating physical activity into lifestyle. Demonstrates understanding / competency    Patient understands using medications safely. Demonstrates understanding / competency    Patient understands monitoring blood glucose, interpreting and using results Demonstrates understanding / competency    Patient understands prevention, detection, and treatment of acute complications. Demonstrates understanding / competency    Patient understands prevention, detection, and treatment of chronic complications. Demonstrates understanding / competency    Patient understands how to develop strategies to address psychosocial issues. Demonstrates understanding / competency    Patient understands how to develop strategies to promote health/change behavior. Needs Review      Outcomes   Expected Outcomes Demonstrated interest in learning. Expect positive outcomes    Future DMSE 3-4 months    Program Status Not Completed      Subsequent Visit   Since your last visit have you continued or begun to take your medications as prescribed? Yes  Since your last visit have you experienced any weight changes? Loss    Weight Loss (lbs) 6    Since your last visit, are you checking your blood glucose at least once a day? Yes             Individualized Plan for Diabetes Self-Management Training:   Learning Objective:  Patient will have a greater understanding of diabetes self-management. Patient education plan is to attend individual and/or group sessions per assessed needs and concerns.   Plan:   Patient Instructions  Continue to stay active. Continue the great changes that you have made! Mindfulness with snack choices and eating out.  Expected Outcomes:  Demonstrated interest in learning. Expect positive outcomes  Education material provided:   If problems or  questions, patient to contact team via:  Phone  Future DSME appointment: 3-4 months

## 2021-10-09 LAB — FRUCTOSAMINE: Fructosamine: 253 umol/L (ref 205–285)

## 2021-10-15 DIAGNOSIS — I10 Essential (primary) hypertension: Secondary | ICD-10-CM | POA: Insufficient documentation

## 2021-10-15 DIAGNOSIS — E785 Hyperlipidemia, unspecified: Secondary | ICD-10-CM | POA: Insufficient documentation

## 2021-10-15 DIAGNOSIS — R072 Precordial pain: Secondary | ICD-10-CM | POA: Insufficient documentation

## 2021-10-15 DIAGNOSIS — R0609 Other forms of dyspnea: Secondary | ICD-10-CM | POA: Insufficient documentation

## 2021-10-15 NOTE — Progress Notes (Signed)
Cardiology Office Note   Date:  10/16/2021   ID:  Heather Fry, DOB [birth year], MRN [id number]  PCP:  Redmond School, MD  Cardiologist:   None Referring:  Redmond School, MD  Chief Complaint  Patient presents with   Chest Pain      History of Present Illness: Heather Fry is a 46 y.o. female who presents for evaluation of chest pain.  She was in the ED in Nov for this.  I reviewed these records for this visit.   There was no evidence of ischemia.  He had a       Lexiscan Myoview on 10/13/2019, which showed normal perfusion, LVEF 63%.  TTE on 10/19/2019 showed normal LVEF, normal RV function, no significant valvular disease.  He saw Dr. Gardiner Rhyme at that time for evaluation of chest pain.    She reports that she is not getting any chest discomfort.  She denied having any new palpitations, presyncope or syncope.  She does get some chest discomfort goes away with bronchodilators.  She has some thyroid issues that are being managed.  She has chronic joint problems and limitations from this.  However, she has not had any new cardiovascular events.  She has had no new weight gain or edema.  She not describing PND or orthopnea.   Past Medical History:  Diagnosis Date   Allergic asthma    Anemia    Anxiety    Arthritis    Depression    Diabetes mellitus without complication (HCC)    Diverticulosis    Fatty liver    moderate to severe   GERD (gastroesophageal reflux disease)    Graves disease    Headache    tension, ocular migraines   Hypothyroidism    Internal hemorrhoids    Pneumonia    as an infant   Sleep apnea    Thyroid disease    Uterine fibroid     Past Surgical History:  Procedure Laterality Date   ABDOMINAL HYSTERECTOMY     Per patient, uterine fibroids.   ABDOMINAL HYSTERECTOMY     CARPAL TUNNEL RELEASE Right 06/11/2021   Procedure: Right Carpel tunnel release;  Surgeon: Vallarie Mare, MD;  Location: [place];  Service: Neurosurgery;  Laterality:  Right;  RM 18   CHOLECYSTECTOMY N/A 11/06/2014   Procedure: LAPAROSCOPIC CHOLECYSTECTOMY;  Surgeon: Jamesetta So, MD;  Location: [facility];  Service: General;  Laterality: N/A;   COLONOSCOPY N/A 03/04/2017   Dr. Gala Romney: Hemorrhoids, diverticulosis, benign polyp without adenomatous changes.  Next colonoscopy 10 years   DILATION AND CURETTAGE OF UTERUS     ESOPHAGOGASTRODUODENOSCOPY N/A 10/02/2014   Dr. Gala Romney: fundic gland polyps, hiatal hernia   ESOPHAGOGASTRODUODENOSCOPY  07/2020   [facility] ; normal esophagus biopsied, benign-appearing gastric polyps biopsied, gastric biopsies obtained to evaluate for H. pylori, normal duodenum.  Pathology with mild chronic gastritis, negative for H. pylori, fundic gland polyps, esophageal biopsy benign.   MOUTH SURGERY     extraction of teeth   POLYPECTOMY  03/04/2017   Procedure: POLYPECTOMY;  Surgeon: Daneil Dolin, MD;  Location: [facility];  Service: Endoscopy;;  colon   THYROIDECTOMY N/A 09/30/2018   Procedure: TOTAL THYROIDECTOMY;  Surgeon: Armandina Gemma, MD;  Location: [facility];  Service: General;  Laterality: N/A;   TUBAL LIGATION       Current Outpatient Medications  Medication Sig Dispense Refill   acarbose (PRECOSE) 50 MG tablet Take 1 tablet (50 mg total) by  mouth 3 (three) times daily with meals. 270 tablet 3   acetaminophen (TYLENOL) 500 MG tablet Take 1,000 mg by mouth daily as needed for moderate pain.     albuterol (VENTOLIN HFA) 108 (90 Base) MCG/ACT inhaler Inhale 2 puffs into the lungs every 6 (six) hours as needed. 18 g 11   cetirizine (ZYRTEC) 10 MG tablet Take 10 mg by mouth daily.     Cholecalciferol (VITAMIN D3) 125 MCG (5000 UT) CAPS Take 1 capsule (5,000 Units total) by mouth daily. (Patient taking differently: Take 10,000 Units by mouth daily.) 30 capsule 11   dicyclomine (BENTYL) 10 MG capsule Take 1 capsule (10 mg total) by mouth up to 3 times daily before meals and at bedtime for abdominal cramping or diarrhea.   Hold in the setting of constipation. (Patient taking differently: as needed. Take 1 capsule (10 mg total) by mouth up to 3 times daily before meals and at bedtime for abdominal cramping or diarrhea.  Hold in the setting of constipation.) 90 capsule 0   EPINEPHrine 0.3 mg/0.3 mL IJ SOAJ injection Inject 0.3 mg into the muscle as needed for anaphylaxis.     EQUATE STOOL SOFTENER 100 MG capsule Take 100 mg by mouth 2 (two) times daily as needed.     escitalopram (LEXAPRO) 20 MG tablet Take 1 tablet (20 mg total) by mouth at bedtime. 30 tablet 5   fluticasone (FLONASE) 50 MCG/ACT nasal spray Place 2 sprays into both nostrils at bedtime.     glucose blood (ACCU-CHEK AVIVA PLUS) test strip 1 each by Other route daily. And lancets 1/day 100 each 3   ipratropium (ATROVENT) 0.02 % nebulizer solution Take 0.5 mg by nebulization as needed.     LIDOCAINE-MENTHOL ROLL-ON EX Apply 1 application topically daily as needed (pain).     Magnesium 250 MG TABS Take 250 mg by mouth at bedtime.      NON FORMULARY at bedtime. CPAP at night     nystatin cream (MYCOSTATIN) Apply 1 application topically daily as needed (toe fungus).     ondansetron (ZOFRAN ODT) 4 MG disintegrating tablet Take 1 tablet (4 mg total) by mouth every 8 (eight) hours as needed for nausea or vomiting. 20 tablet 0   pantoprazole (PROTONIX) 40 MG tablet Take 1 tablet (40 mg total) by mouth 2 (two) times daily. 180 tablet 3   RESTASIS 0.05 % ophthalmic emulsion Place 1 drop into both eyes 2 (two) times daily.     Simethicone 250 MG CAPS Take 500 mg by mouth at bedtime.     sucralfate (CARAFATE) 1 g tablet Take 1 g by mouth as needed.     SYNTHROID 150 MCG tablet Take 150 mcg by mouth daily.     mirabegron ER (MYRBETRIQ) 50 MG TB24 tablet Take 1 tablet (50 mg total) by mouth daily. (Patient not taking: Reported on 10/16/2021) 28 tablet 0   No current facility-administered medications for this visit.    Allergies:   Levofloxacin, Toradol [ketorolac  tromethamine], Tramadol, Levomenol, Other, Chamomile, Hctz [hydrochlorothiazide], Lisinopril, Losartan potassium, Penicillins, and Triamcinolone acetonide    ROS:  Please see the history of present illness.   Otherwise, review of systems are positive for none.   All other systems are reviewed and negative.    PHYSICAL EXAM: VS:  BP (!) 144/88 (BP Location: Left Arm, Patient Position: Sitting, Cuff Size: Normal)    Pulse 81    Ht 5\' 4"  (1.626 m)    Wt 258 lb 3.2  oz (117.1 kg)    LMP 10/09/2014    SpO2 99%    BMI 44.32 kg/m  , BMI Body mass index is 44.32 kg/m. GENERAL:  Well appearing NECK:  No jugular venous distention, waveform within normal limits, carotid upstroke brisk and symmetric, no bruits, no thyromegaly LUNGS:  Clear to auscultation bilaterally CHEST:  Unremarkable HEART:  PMI not displaced or sustained,S1 and S2 within normal limits, no S3, no S4, no clicks, no rubs, no murmurs ABD:  Flat, positive bowel sounds normal in frequency in pitch, no bruits, no rebound, no guarding, no midline pulsatile mass, no hepatomegaly, no splenomegaly EXT:  2 plus pulses throughout, no edema, no cyanosis no clubbing    EKG:  EKG is ordered today. The ekg ordered today demonstrates sinus rhythm, rate 81, axis within normal limits, intervals within normal limits, no acute ST-T wave changes.   Recent Labs: 07/08/2021: ALT 31 08/06/2021: Hemoglobin 12.3; Platelets 308 08/08/2021: Magnesium 1.9 10/04/2021: BUN 10; Creatinine, Ser 0.79; Potassium 4.1; Sodium 138; TSH 1.42    Lipid Panel    Component Value Date/Time   CHOL 222 (H) 01/20/2020 0417   TRIG 306 (H) 01/20/2020 0417   HDL 55 01/20/2020 0417   CHOLHDL 4.0 01/20/2020 0417   VLDL 61 (H) 01/20/2020 0417   LDLCALC 106 (H) 01/20/2020 0417   LDLDIRECT 135.0 06/29/2019 1458      Wt Readings from Last 3 Encounters:  10/16/21 258 lb 3.2 oz (117.1 kg)  10/04/21 253 lb (114.8 kg)  10/04/21 253 lb 12.8 oz (115.1 kg)      Other  studies Reviewed: Additional studies/ records that were reviewed today include: ED records. Review of the above records demonstrates:  Please see elsewhere in the note.     ASSESSMENT AND PLAN:   HTN: Patient's blood pressure is controlled.  No change in therapy.  Chest pain: The patient has a chronic chest pain pattern unchanged from previous with a negative perfusion study.  Her last emergency room visit was nonanginal chest pain.  No further cardiac work-up.  DOE: This is pulmonary and managed appropriately and chronic.  No change in therapy.    Sleep apnea: She uses CPAP.  No change in therapy.  Current medicines are reviewed at length with the patient today.  The patient does not have concerns regarding medicines.  The following changes have been made:  no change  Labs/ tests ordered today include: None  Orders Placed This Encounter  Procedures   EKG 12-Lead     Disposition:   FU with me as needed.     Signed, Minus Breeding, MD  10/16/2021 8:39 PM    [place]

## 2021-10-16 ENCOUNTER — Ambulatory Visit (INDEPENDENT_AMBULATORY_CARE_PROVIDER_SITE_OTHER): Payer: Medicaid Other | Admitting: Cardiology

## 2021-10-16 ENCOUNTER — Encounter: Payer: Self-pay | Admitting: Cardiology

## 2021-10-16 ENCOUNTER — Other Ambulatory Visit: Payer: Self-pay

## 2021-10-16 VITALS — BP 144/88 | HR 81 | Ht 64.0 in | Wt 258.2 lb

## 2021-10-16 DIAGNOSIS — R072 Precordial pain: Secondary | ICD-10-CM

## 2021-10-16 DIAGNOSIS — E785 Hyperlipidemia, unspecified: Secondary | ICD-10-CM

## 2021-10-16 DIAGNOSIS — R0609 Other forms of dyspnea: Secondary | ICD-10-CM | POA: Diagnosis not present

## 2021-10-16 DIAGNOSIS — I10 Essential (primary) hypertension: Secondary | ICD-10-CM

## 2021-10-16 NOTE — Patient Instructions (Signed)
Medication Instructions:  The current medical regimen is effective;  continue present plan and medications.  *If you need a refill on your cardiac medications before your next appointment, please call your pharmacy*  Follow-Up: At [facility], you and your health needs are our priority.  As part of our continuing mission to provide you with exceptional heart care, we have created designated Provider Care Teams.  These Care Teams include your primary Cardiologist (physician) and Advanced Practice Providers (APPs -  Physician Assistants and Nurse Practitioners) who all work together to provide you with the care you need, when you need it.  We recommend signing up for the patient portal called "MyChart".  Sign up information is provided on this After Visit Summary.  MyChart is used to connect with patients for Virtual Visits (Telemedicine).  Patients are able to view lab/test results, encounter notes, upcoming appointments, etc.  Non-urgent messages can be sent to your provider as well.   To learn more about what you can do with MyChart, go to [email or web address].    Your next appointment:   1 year(s)  The format for your next appointment:   In Person  Provider:   James Hochrein, MD   Thank you for choosing [facility]!!    

## 2021-11-05 ENCOUNTER — Ambulatory Visit: Payer: Medicaid Other | Admitting: Urology

## 2021-12-14 ENCOUNTER — Encounter (HOSPITAL_COMMUNITY): Payer: Self-pay | Admitting: *Deleted

## 2021-12-14 ENCOUNTER — Emergency Department (HOSPITAL_COMMUNITY): Payer: Medicaid Other

## 2021-12-14 ENCOUNTER — Other Ambulatory Visit: Payer: Self-pay

## 2021-12-14 DIAGNOSIS — J069 Acute upper respiratory infection, unspecified: Secondary | ICD-10-CM | POA: Diagnosis not present

## 2021-12-14 DIAGNOSIS — Z5321 Procedure and treatment not carried out due to patient leaving prior to being seen by health care provider: Secondary | ICD-10-CM | POA: Diagnosis not present

## 2021-12-14 DIAGNOSIS — J45909 Unspecified asthma, uncomplicated: Secondary | ICD-10-CM | POA: Insufficient documentation

## 2021-12-14 LAB — CBG MONITORING, ED: Glucose-Capillary: 139 mg/dL — ABNORMAL HIGH (ref 70–99)

## 2021-12-14 IMAGING — DX DG CHEST 2V
2 series · 2 of 2 positions shown · non-contrast
Comparison: Chest x-ray [DATE].

CLINICAL DATA: Asthma.

EXAM:
CHEST - 2 VIEW

[chest pa]
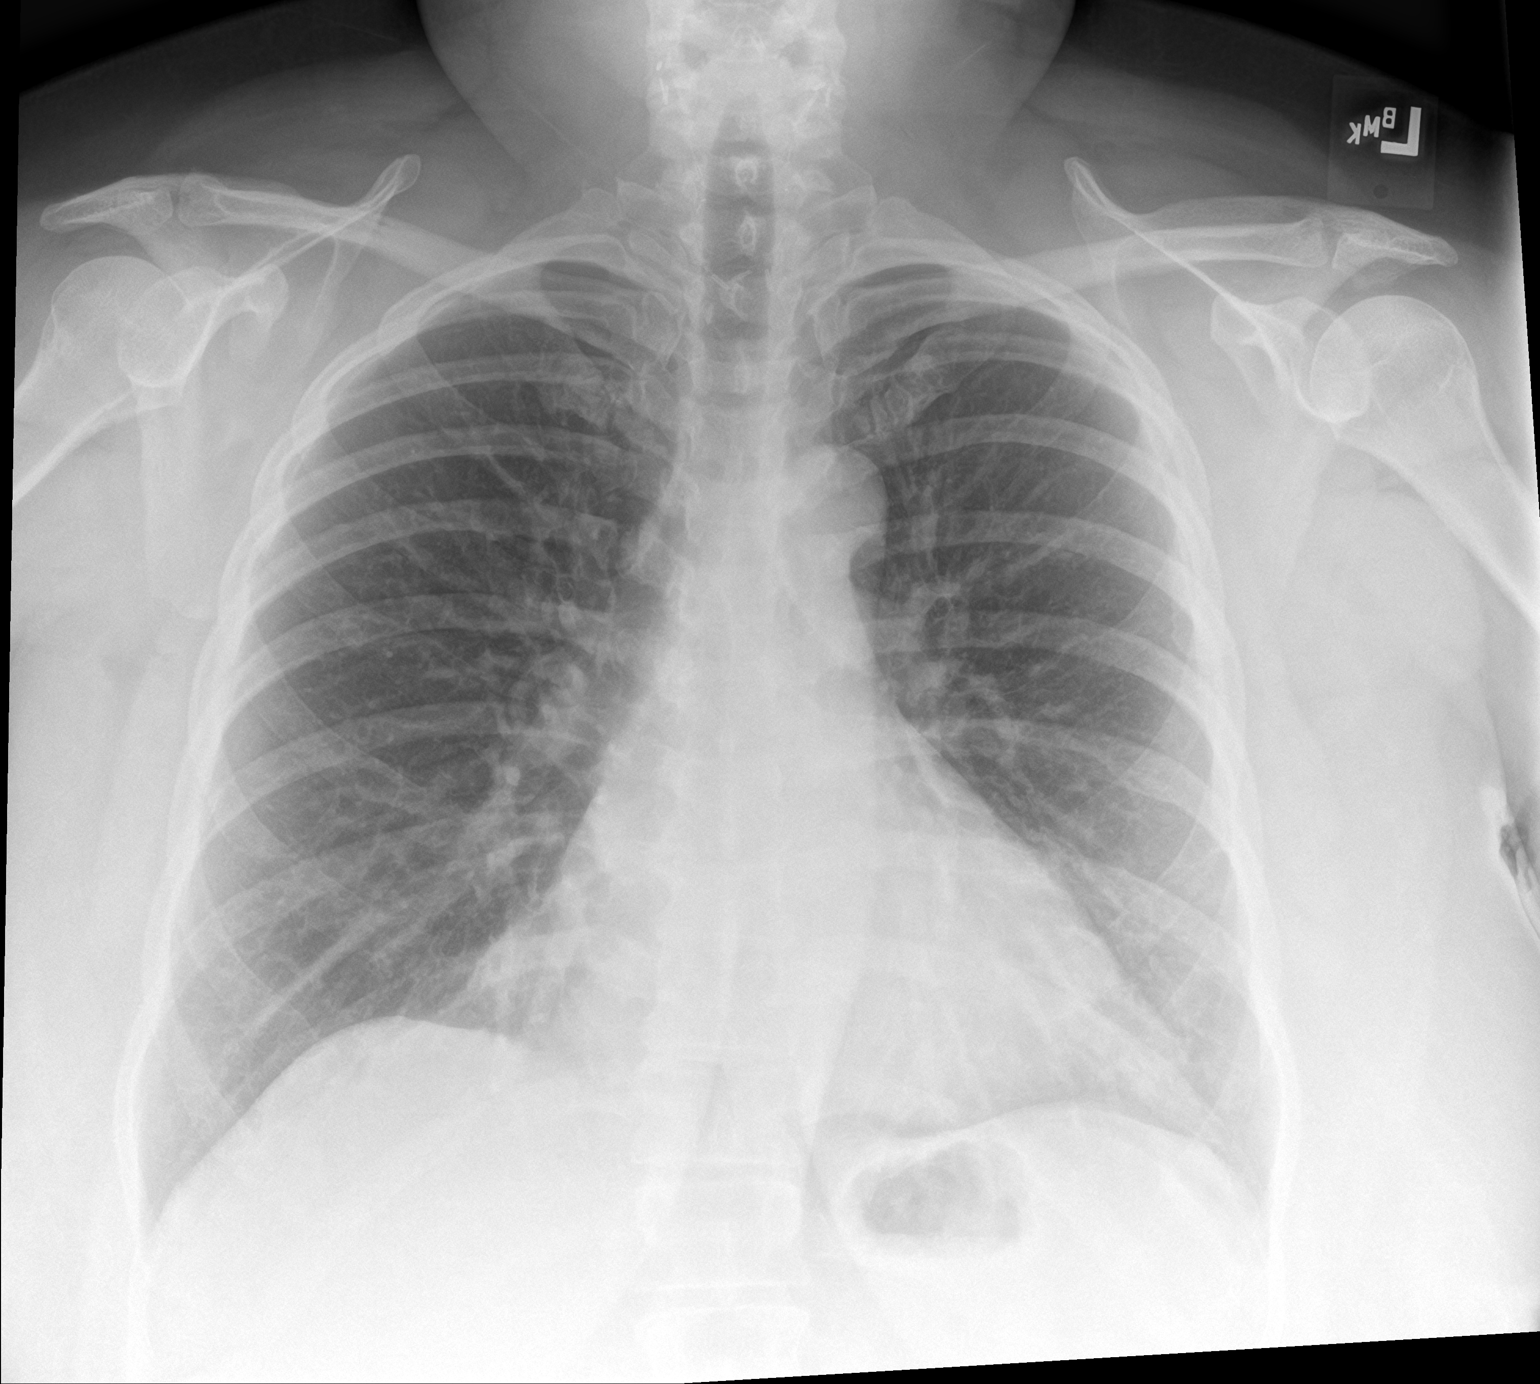

[chest lat]
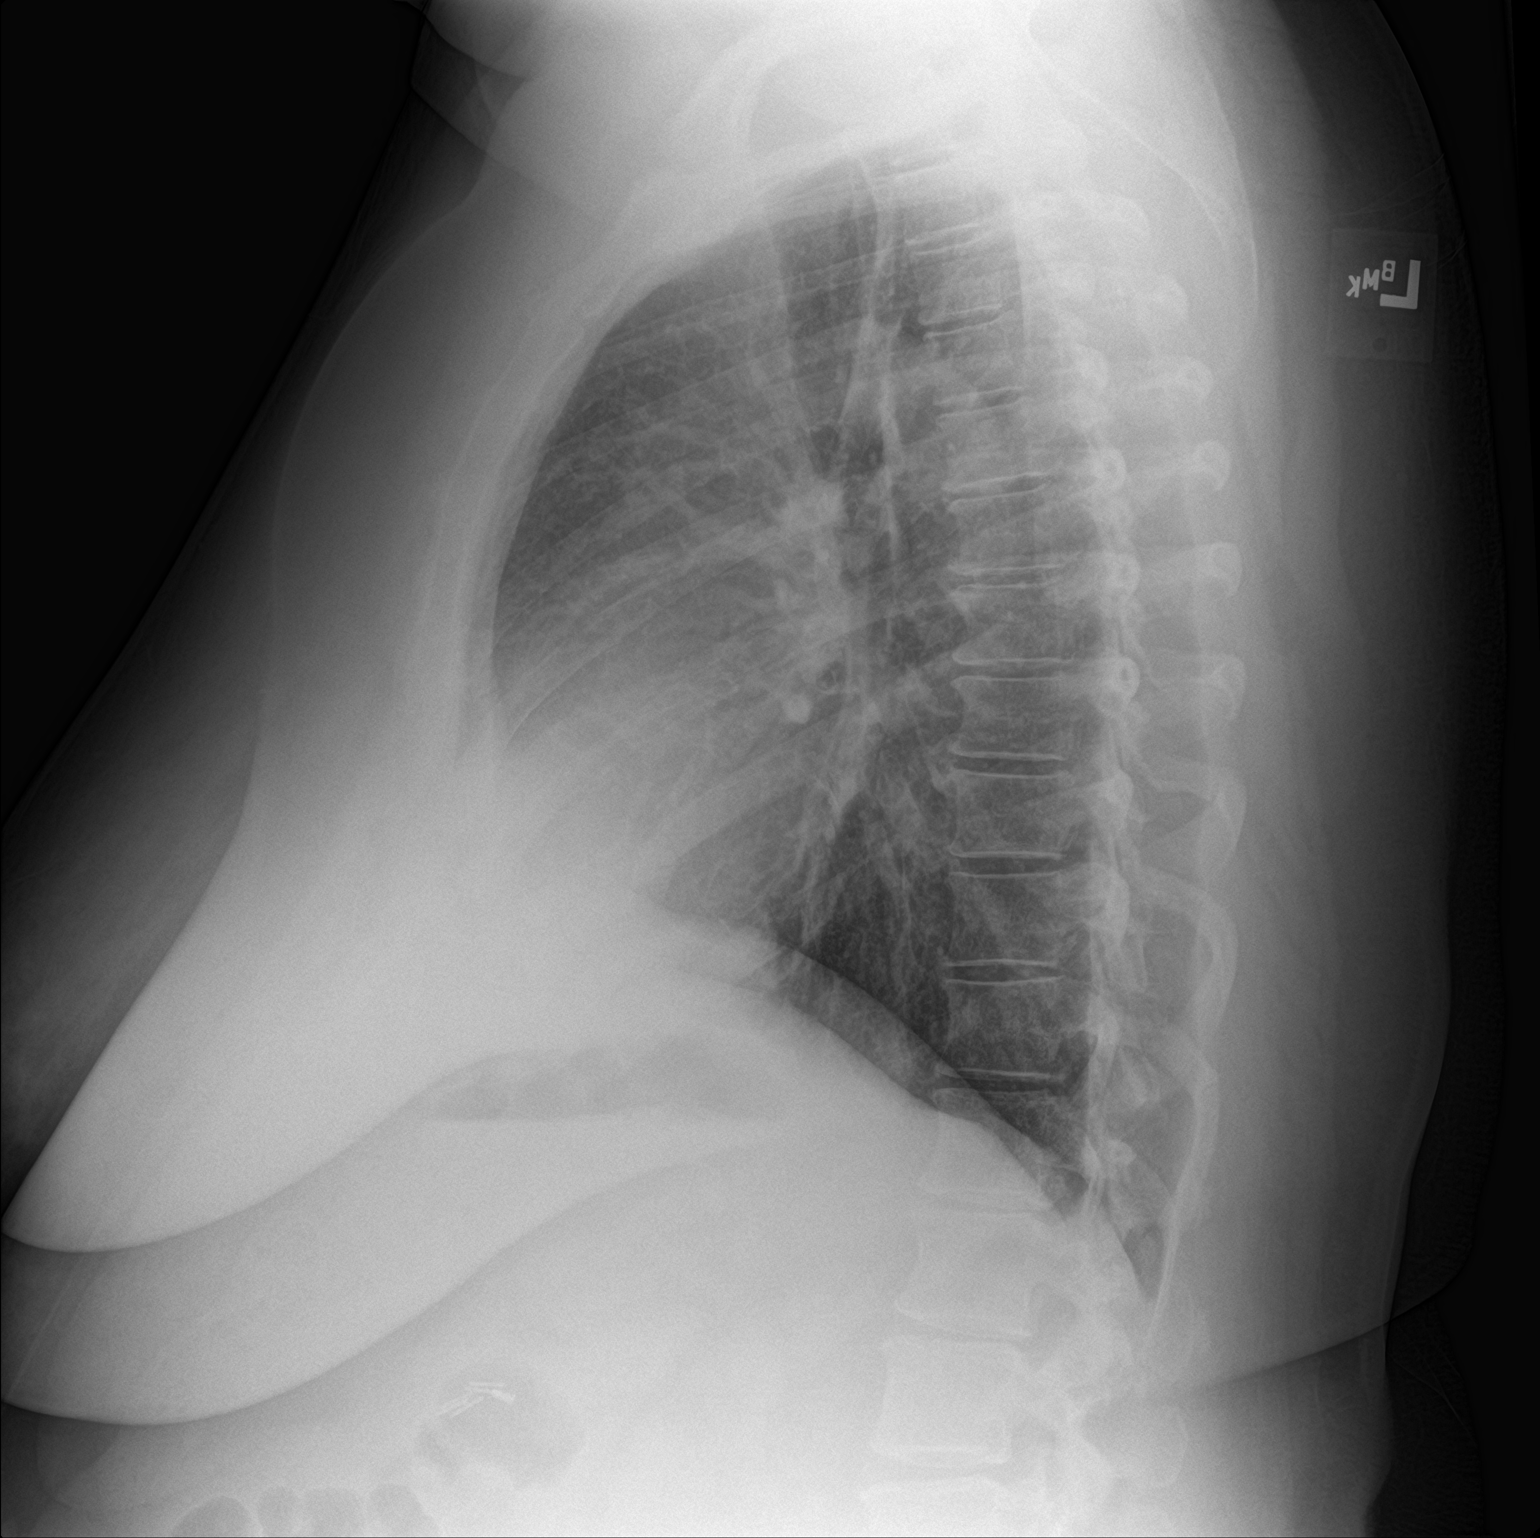

[2 of 2 positions shown; findings below may reference images not displayed]

FINDINGS: The heart size and mediastinal contours are within normal limits.
Both lungs are clear. The visualized skeletal structures are
unremarkable.
IMPRESSION: No active cardiopulmonary disease.

## 2021-12-14 NOTE — ED Triage Notes (Signed)
Pt with asthma, pt seen PCP on Thursday, was told it was her asthma.  Currently on steroids, states her CBG was elevated at 265 today.  Last used inhaler PTA and helped per pt. +coughing, feels raw per pt.  ?

## 2021-12-15 ENCOUNTER — Emergency Department (HOSPITAL_COMMUNITY)
Admission: EM | Admit: 2021-12-15 | Discharge: 2021-12-15 | Disposition: A | Discharge status: Home / Self Care | Payer: Medicaid Other | Attending: Emergency Medicine | Admitting: Emergency Medicine

## 2021-12-15 ENCOUNTER — Other Ambulatory Visit: Payer: Self-pay

## 2021-12-15 ENCOUNTER — Encounter (HOSPITAL_COMMUNITY): Payer: Self-pay | Admitting: Emergency Medicine

## 2021-12-15 ENCOUNTER — Emergency Department (HOSPITAL_COMMUNITY)
Admission: EM | Admit: 2021-12-15 | Discharge: 2021-12-15 | Disposition: A | Discharge status: Home / Self Care | Payer: Medicaid Other | Source: Home / Self Care | Attending: Emergency Medicine | Admitting: Emergency Medicine

## 2021-12-15 DIAGNOSIS — J069 Acute upper respiratory infection, unspecified: Secondary | ICD-10-CM | POA: Insufficient documentation

## 2021-12-15 DIAGNOSIS — J45909 Unspecified asthma, uncomplicated: Secondary | ICD-10-CM | POA: Insufficient documentation

## 2021-12-15 DIAGNOSIS — Z8639 Personal history of other endocrine, nutritional and metabolic disease: Secondary | ICD-10-CM

## 2021-12-15 LAB — BASIC METABOLIC PANEL
Anion gap: 12 (ref 5–15)
Anion gap: 10 (ref 5–15)
BUN: 11 mg/dL (ref 6–20)
BUN: 14 mg/dL (ref 6–20)
CO2: 23 mmol/L (ref 22–32)
CO2: 25 mmol/L (ref 22–32)
Calcium: 9.1 mg/dL (ref 8.9–10.3)
Calcium: 9.2 mg/dL (ref 8.9–10.3)
Chloride: 103 mmol/L (ref 98–111)
Chloride: 103 mmol/L (ref 98–111)
Creatinine, Ser: 0.71 mg/dL (ref 0.44–1.00)
Creatinine, Ser: 0.81 mg/dL (ref 0.44–1.00)
GFR, Estimated: >60 mL/min (ref >60)
GFR, Estimated: >60 mL/min (ref >60)
Glucose, Bld: 159 mg/dL — ABNORMAL HIGH (ref 70–99)
Glucose, Bld: 208 mg/dL — ABNORMAL HIGH (ref 70–99)
Potassium: 3.4 mmol/L — ABNORMAL LOW (ref 3.5–5.1)
Potassium: 3.6 mmol/L (ref 3.5–5.1)
Sodium: 138 mmol/L (ref 135–145)
Sodium: 138 mmol/L (ref 135–145)

## 2021-12-15 LAB — CBC WITH DIFFERENTIAL/PLATELET
Abs Immature Granulocytes: 0.05 10*3/uL (ref 0.00–0.07)
Abs Immature Granulocytes: 0.06 10*3/uL (ref 0.00–0.07)
Basophils Absolute: 0 10*3/uL (ref 0.0–0.1)
Basophils Absolute: 0 10*3/uL (ref 0.0–0.1)
Basophils Relative: 0 %
Basophils Relative: 0 %
Eosinophils Absolute: 0 10*3/uL (ref 0.0–0.5)
Eosinophils Absolute: 0.1 10*3/uL (ref 0.0–0.5)
Eosinophils Relative: 0 %
Eosinophils Relative: 1 %
HCT: 39.3 % (ref 36.0–46.0)
HCT: 40.1 % (ref 36.0–46.0)
Hemoglobin: 12.3 g/dL (ref 12.0–15.0)
Hemoglobin: 12.9 g/dL (ref 12.0–15.0)
Immature Granulocytes: 1 %
Immature Granulocytes: 1 %
Lymphocytes Relative: 22 %
Lymphocytes Relative: 23 %
Lymphs Abs: 2.1 10*3/uL (ref 0.7–4.0)
Lymphs Abs: 2.6 10*3/uL (ref 0.7–4.0)
MCH: 26 pg (ref 26.0–34.0)
MCH: 27 pg (ref 26.0–34.0)
MCHC: 31.3 g/dL (ref 30.0–36.0)
MCHC: 32.2 g/dL (ref 30.0–36.0)
MCV: 83.1 fL (ref 80.0–100.0)
MCV: 84.1 fL (ref 80.0–100.0)
Monocytes Absolute: 0.3 10*3/uL (ref 0.1–1.0)
Monocytes Absolute: 0.5 10*3/uL (ref 0.1–1.0)
Monocytes Relative: 4 %
Monocytes Relative: 4 %
Neutro Abs: 6.5 10*3/uL (ref 1.7–7.7)
Neutro Abs: 8.6 10*3/uL — ABNORMAL HIGH (ref 1.7–7.7)
Neutrophils Relative %: 71 %
Neutrophils Relative %: 73 %
Platelets: 272 10*3/uL (ref 150–400)
Platelets: 293 10*3/uL (ref 150–400)
RBC: 4.73 MIL/uL (ref 3.87–5.11)
RBC: 4.77 MIL/uL (ref 3.87–5.11)
RDW: 14.6 % (ref 11.5–15.5)
RDW: 14.6 % (ref 11.5–15.5)
WBC: 11.8 10*3/uL — ABNORMAL HIGH (ref 4.0–10.5)
WBC: 9 10*3/uL (ref 4.0–10.5)
nRBC: 0 % (ref 0.0–0.2)
nRBC: 0 % (ref 0.0–0.2)

## 2021-12-15 NOTE — ED Triage Notes (Signed)
Pt reports cough, headache, nasal and chest congestion x 2 weeks, reports she is concerned for bronchitis, hx of "allergic asthma"; reports she saw PCP 2 weeks ago and again 3 days ago, was prescribed prednisone-reports unable to take due to "throwing her electrolytes off" ?

## 2021-12-15 NOTE — ED Provider Notes (Signed)
?Heather Fry ?Provider Note ? ? ?CSN: [id number] ?Arrival date & time: 12/15/21  1648 ? ?  ? ?History ? ?Chief Complaint  ?Patient presents with  ? Nasal Congestion  ? ? ?Heather Fry is a 46 y.o. female who presents the emergency department complaining of cough, headache, nasal and chest congestion for 2 weeks.  Patient states that she was seen her PCP 2 weeks ago, and again 3 days ago.  She states that she was prescribed prednisone, but reports she was unable to take it due to "throwing her electrolytes off".  She reports a history of "allergic asthma", and is concerned about bronchitis today. ? ?Patient initially presented to the emergency department last night, had labs drawn and a chest x-ray performed, but left without being seen due to wait times.  She is requesting a recheck of her electrolytes again today, she continues to have some muscle spasms and "feeling off". ? ?HPI ? ?  ? ?Home Medications ?Prior to Admission medications   ?Medication Sig Start Date End Date Taking? Authorizing Provider  ?acarbose (PRECOSE) 50 MG tablet Take 1 tablet (50 mg total) by mouth 3 (three) times daily with meals. 05/30/21   Renato Shin, MD  ?acetaminophen (TYLENOL) 500 MG tablet Take 1,000 mg by mouth daily as needed for moderate pain.    [provider]  ?albuterol (VENTOLIN HFA) 108 (90 Base) MCG/ACT inhaler Inhale 2 puffs into the lungs every 6 (six) hours as needed. 12/13/19   Julian Hy, DO  ?cetirizine (ZYRTEC) 10 MG tablet Take 10 mg by mouth daily.    [provider]  ?Cholecalciferol (VITAMIN D3) 125 MCG (5000 UT) CAPS Take 1 capsule (5,000 Units total) by mouth daily. ?Patient taking differently: Take 10,000 Units by mouth daily. 10/13/19   Renato Shin, MD  ?dicyclomine (BENTYL) 10 MG capsule Take 1 capsule (10 mg total) by mouth up to 3 times daily before meals and at bedtime for abdominal cramping or diarrhea.  Hold in the setting of constipation. ?Patient taking  differently: as needed. Take 1 capsule (10 mg total) by mouth up to 3 times daily before meals and at bedtime for abdominal cramping or diarrhea.  Hold in the setting of constipation. 04/17/21   Erenest Rasher, PA-C  ?EPINEPHrine 0.3 mg/0.3 mL IJ SOAJ injection Inject 0.3 mg into the muscle as needed for anaphylaxis. 08/10/20   [provider]  ?EQUATE STOOL SOFTENER 100 MG capsule Take 100 mg by mouth 2 (two) times daily as needed. 08/21/21   [provider]  ?escitalopram (LEXAPRO) 20 MG tablet Take 1 tablet (20 mg total) by mouth at bedtime. 01/26/19   Renato Shin, MD  ?fluticasone Asencion Islam) 50 MCG/ACT nasal spray Place 2 sprays into both nostrils at bedtime.    [provider]  ?glucose blood (ACCU-CHEK AVIVA PLUS) test strip 1 each by Other route daily. And lancets 1/day 05/30/21   Renato Shin, MD  ?ipratropium (ATROVENT) 0.02 % nebulizer solution Take 0.5 mg by nebulization as needed.    [provider]  ?LIDOCAINE-MENTHOL ROLL-ON EX Apply 1 application topically daily as needed (pain).    [provider]  ?Magnesium 250 MG TABS Take 250 mg by mouth at bedtime.     [provider]  ?mirabegron ER (MYRBETRIQ) 50 MG TB24 tablet Take 1 tablet (50 mg total) by mouth daily. ?Patient not taking: Reported on 10/16/2021 10/01/21   Stoneking, Reece Leader., MD  ?NON FORMULARY at bedtime. CPAP at night  [provider]  ?nystatin cream (MYCOSTATIN) Apply 1 application topically daily as needed (toe fungus). 03/15/21   [provider]  ?ondansetron (ZOFRAN ODT) 4 MG disintegrating tablet Take 1 tablet (4 mg total) by mouth every 8 (eight) hours as needed for nausea or vomiting. 07/08/21   Evalee Jefferson, PA-C  ?pantoprazole (PROTONIX) 40 MG tablet Take 1 tablet (40 mg total) by mouth 2 (two) times daily. 04/17/21   Erenest Rasher, PA-C  ?RESTASIS 0.05 % ophthalmic emulsion Place 1 drop into both eyes 2 (two) times daily. 12/26/19   [provider]  ?Simethicone 250 MG CAPS Take 500 mg by mouth at bedtime.    [provider]  ?sucralfate (CARAFATE) 1 g tablet Take 1 g by mouth as needed. 08/20/20   [provider]  ?SYNTHROID 150 MCG tablet Take 150 mcg by mouth daily. 04/06/21   [provider]  ?   ? ?Allergies    ?Levofloxacin, Toradol [ketorolac tromethamine], Tramadol, Levomenol, Other, Chamomile, Hctz [hydrochlorothiazide], Lisinopril, Losartan potassium, Penicillins, and Triamcinolone acetonide   ? ?Review of Systems   ?Review of Systems  ?Constitutional:  Negative for fever.  ?HENT:  Positive for congestion and sore throat.   ?Respiratory:  Positive for cough, shortness of breath and wheezing.   ?Cardiovascular:  Negative for chest pain.  ?Gastrointestinal:  Positive for abdominal pain and nausea. Negative for diarrhea and vomiting.  ?Neurological:  Positive for headaches.  ?All other systems reviewed and are negative. ? ?Physical Exam ?Updated Vital Signs ?BP (!) 150/92   Pulse 87   Temp 98.5 ?F (36.9 ?C) (Oral)   Resp 18   Ht '5\' 4"'$  (1.626 m)   Wt 112.5 kg   LMP 10/09/2014   SpO2 98%   BMI 42.57 kg/m?  ?Physical Exam ?Vitals and nursing note reviewed.  ?Constitutional:   ?   Appearance: Normal appearance.  ?HENT:  ?   Head: Normocephalic and atraumatic.  ?   Nose: Congestion present.  ?Eyes:  ?   Conjunctiva/sclera: Conjunctivae normal.  ?Cardiovascular:  ?   Rate and Rhythm: Normal rate and regular rhythm.  ?Pulmonary:  ?   Effort: Pulmonary effort is normal. No respiratory distress.  ?   Breath sounds: Examination of the right-upper field reveals wheezing. Wheezing present.  ?Abdominal:  ?   General: There is no distension.  ?   Palpations: Abdomen is soft.  ?   Tenderness: There is no abdominal tenderness.  ?Skin: ?   General: Skin is warm and dry.  ?Neurological:  ?   General: No focal deficit present.  ?   Mental Status: She is alert.  ? ? ?ED Results / Procedures / Treatments   ?Labs ?(all labs ordered are  listed, but only abnormal results are displayed) ?Labs Reviewed  ?BASIC METABOLIC PANEL - Abnormal; Notable for the following components:  ?    Result Value  ? Glucose, Bld 208 (*)   ? All other components within normal limits  ?CBC WITH DIFFERENTIAL/PLATELET  ? ? ?EKG ?None ? ?Radiology ?DG Chest 2 View ? ?Result Date: 12/14/2021 ?CLINICAL DATA:  Asthma. EXAM: CHEST - 2 VIEW COMPARISON:  Chest x-ray 08/06/2021. FINDINGS: The heart size and mediastinal contours are within normal limits. Both lungs are clear. The visualized skeletal structures are unremarkable. IMPRESSION: No active cardiopulmonary disease. Electronically Signed   By: Ronney Asters M.D.   On: 12/14/2021 21:39   ? ?Procedures ?Procedures  ? ? ?Medications Ordered in ED ?Medications - No  data to display ? ?ED Course/ Medical Decision Making/ A&P ?  ?                        ?Medical Decision Making ? ?This patient is a 46 year old female who presents to the ED for concern of congestion and cough.  ? ?Differential diagnoses prior to evaluation: ?The emergent differential diagnosis includes, but is not limited to,  Upper respiratory infection, lower respiratory infection, allergies, asthma, irritants, foreign body, medications (ACE inhibitors), reflux, asthma, CHF, lung cancer, interstitial lung disease, psychiatric causes, postnasal drip and postinfectious bronchospasm.  ? ?This is not an exhaustive differential.  ? ?Past Medical History / Co-morbidities: ?Depression, anxiety, GERD, thyroid disease s/p thyroidectomy, diabetes, allergic asthma ? ?Additional history: ?Chest x-ray performed this morning at initial ER visit reviewed by myself. It showed no acute cardiopulmonary abnormalities. I agree with the radiologist interpretation.  ? ?Physical Exam: ?Physical exam performed. The pertinent findings include: Patient is afebrile, not tachycardic, not hypoxic, no acute distress.  Mild expiratory wheezing, worse in the right upper lung field. ? ?Lab  Tests/Imaging studies: ?I Ordered, and personally interpreted labs/imaging including CBC and BMP.  The pertinent results include: No leukocytosis, normal hemoglobin.  Glucose of 208, otherwise electrolytes within normal lim

## 2021-12-15 NOTE — Discharge Instructions (Signed)
You were seen in the emergency department today for congestion and for electrolyte check. ? ?As we discussed your electrolytes are all within normal limits today.  Your chest x-ray that was performed earlier this morning did not show any findings consistent with pneumonia.  I think your overall symptoms are related to a viral illness.  Unfortunately these can take several days to improve.  I think it safe to hold off on taking the rest of the Medrol Dosepak. ? ?If your symptoms persist into the middle of the week, I recommend following up with your primary care doctor. ? ?Continue to monitor how you're doing and return to the ER for new or worsening symptoms.  ?

## 2022-01-11 ENCOUNTER — Other Ambulatory Visit: Payer: Self-pay

## 2022-01-11 ENCOUNTER — Encounter (HOSPITAL_COMMUNITY): Payer: Self-pay

## 2022-01-11 ENCOUNTER — Emergency Department (HOSPITAL_COMMUNITY)
Admission: EM | Admit: 2022-01-11 | Discharge: 2022-01-12 | Disposition: A | Discharge status: Home / Self Care | Payer: Medicaid Other | Attending: Emergency Medicine | Admitting: Emergency Medicine

## 2022-01-11 ENCOUNTER — Emergency Department (HOSPITAL_COMMUNITY): Payer: Medicaid Other

## 2022-01-11 DIAGNOSIS — J45909 Unspecified asthma, uncomplicated: Secondary | ICD-10-CM | POA: Insufficient documentation

## 2022-01-11 DIAGNOSIS — Z7952 Long term (current) use of systemic steroids: Secondary | ICD-10-CM | POA: Insufficient documentation

## 2022-01-11 DIAGNOSIS — R0602 Shortness of breath: Secondary | ICD-10-CM | POA: Diagnosis present

## 2022-01-11 DIAGNOSIS — R400 Somnolence: Secondary | ICD-10-CM | POA: Insufficient documentation

## 2022-01-11 DIAGNOSIS — Z7951 Long term (current) use of inhaled steroids: Secondary | ICD-10-CM | POA: Diagnosis not present

## 2022-01-11 DIAGNOSIS — F5104 Psychophysiologic insomnia: Secondary | ICD-10-CM | POA: Insufficient documentation

## 2022-01-11 IMAGING — DX DG CHEST 2V
2 series · 2 of 2 positions shown · non-contrast
Comparison: [DATE]

CLINICAL DATA: Short of breath, chest pain, asthma exacerbation

EXAM:
CHEST - 2 VIEW

[chest pa]
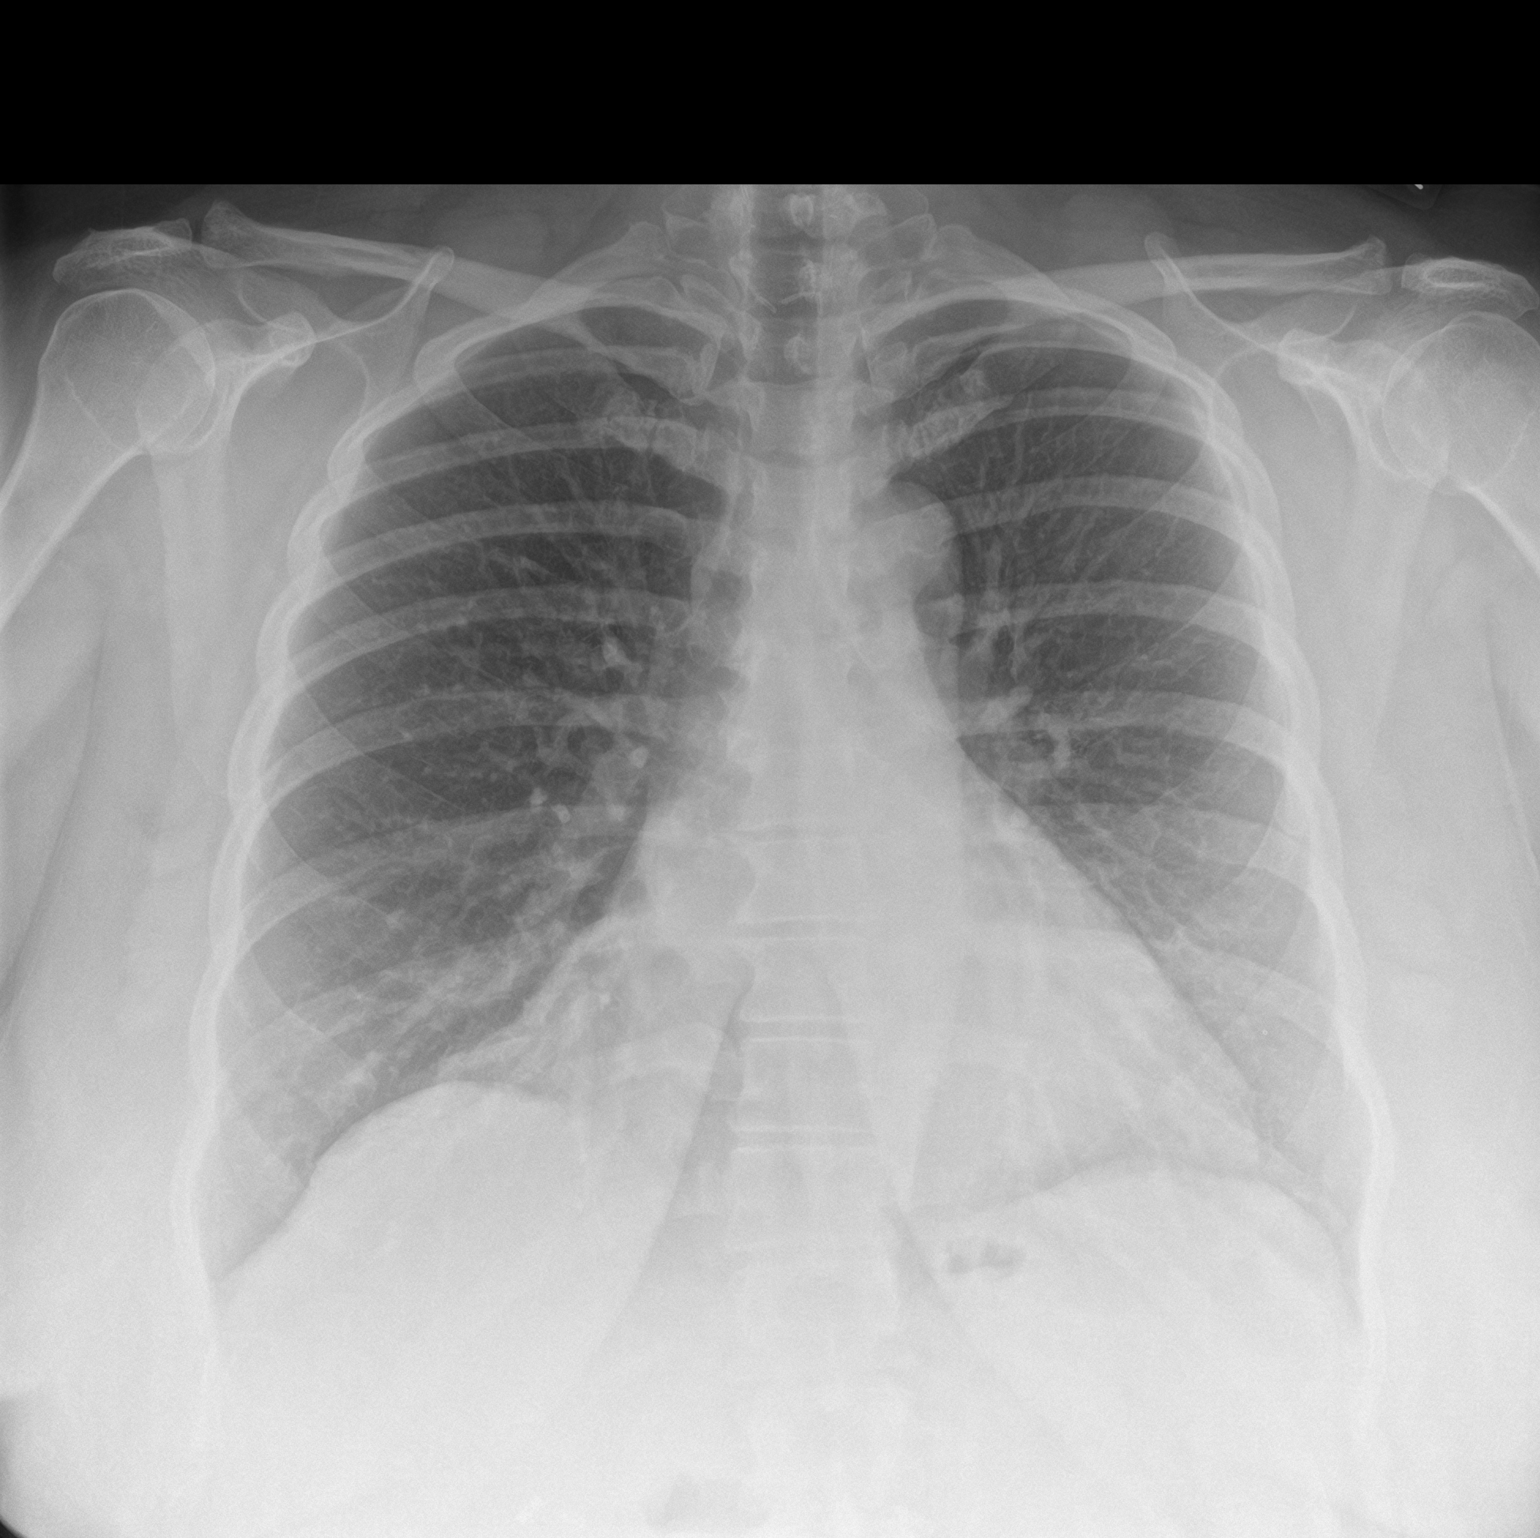

[chest lat]
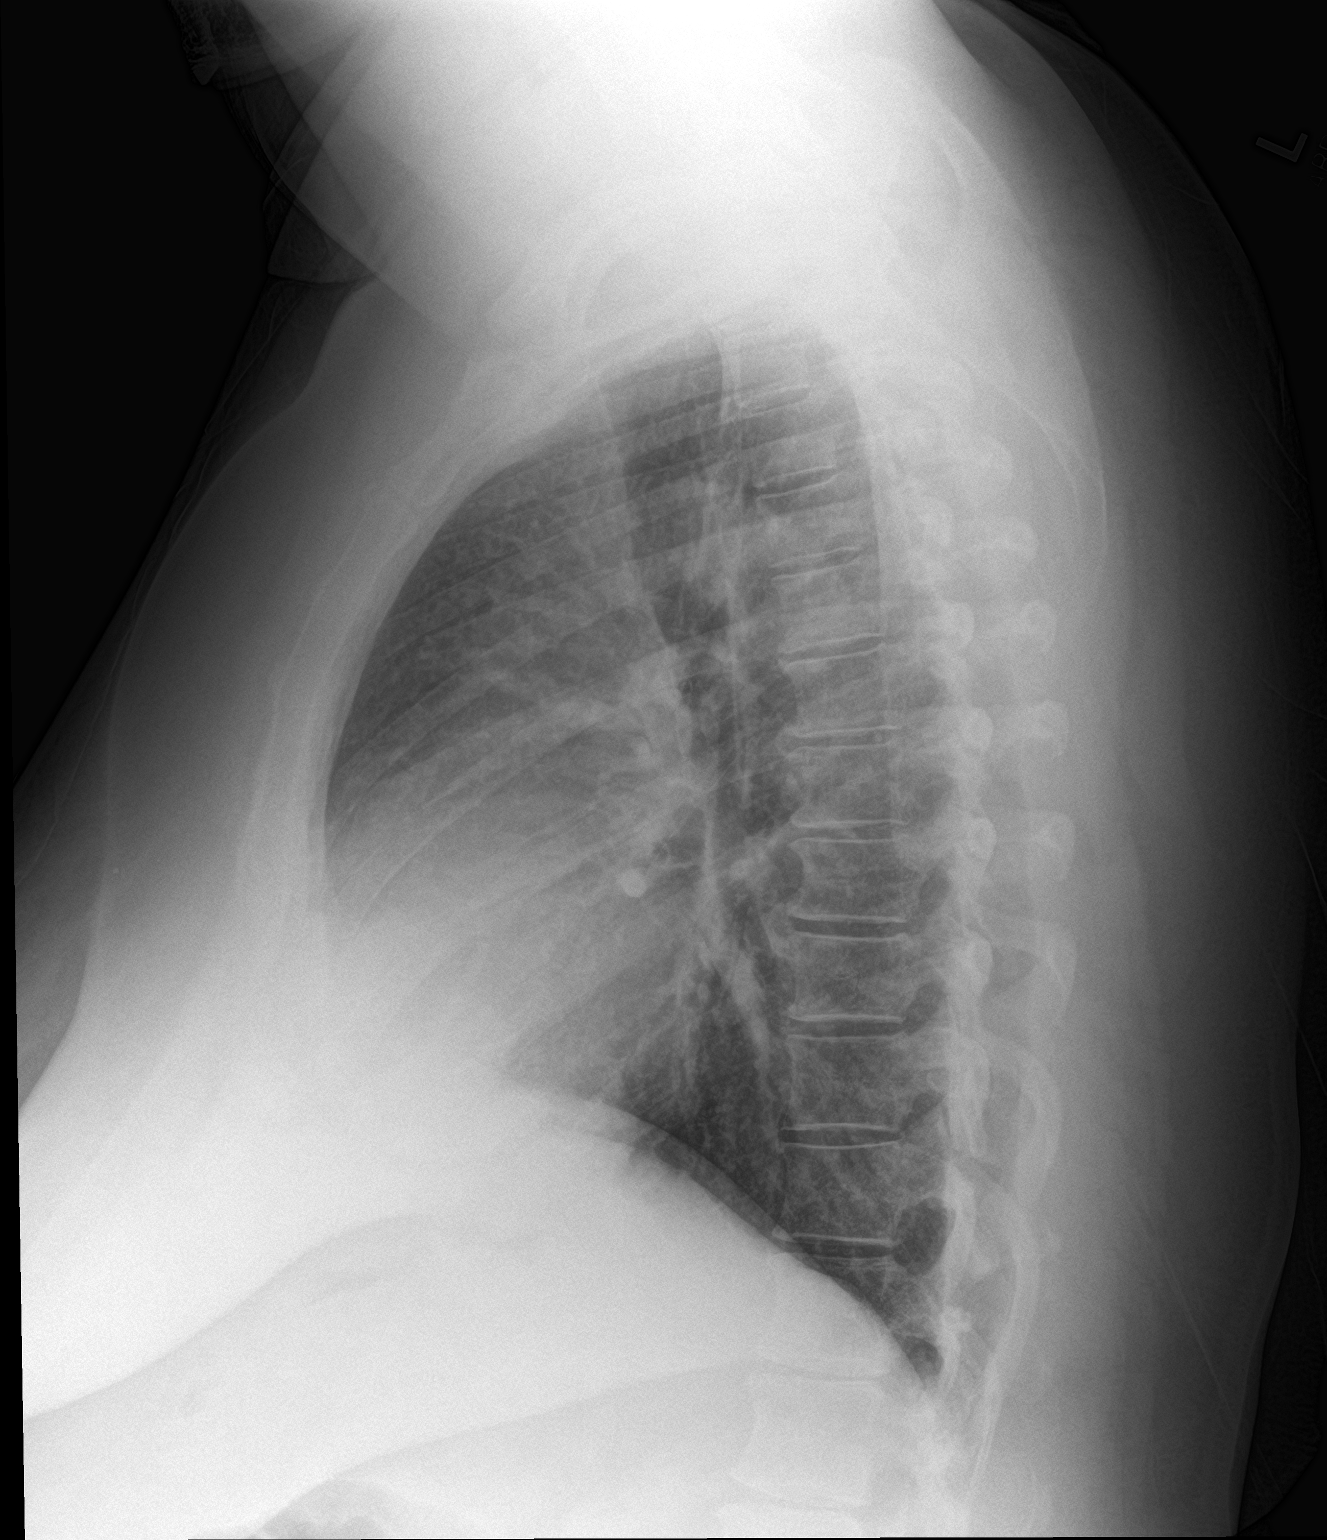

[2 of 2 positions shown; findings below may reference images not displayed]

FINDINGS: The heart size and mediastinal contours are within normal limits.
Both lungs are clear. The visualized skeletal structures are
unremarkable.
IMPRESSION: No active cardiopulmonary disease.

## 2022-01-11 NOTE — ED Notes (Signed)
ED Provider at bedside. 

## 2022-01-11 NOTE — ED Triage Notes (Signed)
Pt arrived via POV from home c/o asthma flare up. Pt reports using her nebulizer at home PTA w/o relief. Pt appears in NAD.  ?

## 2022-01-12 LAB — I-STAT CHEM 8, ED
BUN: 7 mg/dL (ref 6–20)
Calcium, Ion: 1.21 mmol/L (ref 1.15–1.40)
Chloride: 102 mmol/L (ref 98–111)
Creatinine, Ser: 0.6 mg/dL (ref 0.44–1.00)
Glucose, Bld: 129 mg/dL — ABNORMAL HIGH (ref 70–99)
HCT: 39 % (ref 36.0–46.0)
Hemoglobin: 13.3 g/dL (ref 12.0–15.0)
Potassium: 3.8 mmol/L (ref 3.5–5.1)
Sodium: 138 mmol/L (ref 135–145)
TCO2: 27 mmol/L (ref 22–32)

## 2022-01-12 NOTE — ED Provider Notes (Signed)
?[facility] ?Provider Note ? ? ?CSN: [id number] ?Arrival date & time: 01/11/22  2247 ? ?  ? ?History ? ?Chief Complaint  ?Patient presents with  ? Asthma  ? ? ?Heather Fry is a 46 y.o. female. ? ?46 year old female with a history of asthma and anxiety the presents to the ER today secondary to shortness of breath.  Patient states that she had an episode today where she felt like she was wheezing she was shortness of breath and she had a tightness around her chest that felt like a hug.  She states that she has had this feeling before with asthma exacerbations she took some breathing treatments prior to coming in and since that time she states that she has seemed to improved.  No nausea or vomiting.  No lightheadedness.  She is worried her electrolytes might be abnormal but for no particular reason.  States she has stress in her life and this could be related. ? ? ?Asthma ? ? ?  ? ?Home Medications ?Prior to Admission medications   ?Medication Sig Start Date End Date Taking? Authorizing Provider  ?meclizine (ANTIVERT) 25 MG tablet Take 1 tablet by mouth 4 (four) times daily as needed. 10/29/21  Yes [provider]  ?methylPREDNISolone (MEDROL DOSEPAK) 4 MG TBPK tablet See admin instructions. 11/08/21  Yes [provider]  ?predniSONE (DELTASONE) 5 MG tablet Take by mouth. 10/29/21  Yes [provider]  ?acarbose (PRECOSE) 100 MG tablet Take 100 mg by mouth 3 (three) times daily. 11/01/21   [provider]  ?acarbose (PRECOSE) 50 MG tablet Take 1 tablet (50 mg total) by mouth 3 (three) times daily with meals. 05/30/21   Renato Shin, MD  ?acetaminophen (TYLENOL) 500 MG tablet Take 1,000 mg by mouth daily as needed for moderate pain.    [provider]  ?albuterol (VENTOLIN HFA) 108 (90 Base) MCG/ACT inhaler Inhale 2 puffs into the lungs every 6 (six) hours as needed. 12/13/19   Julian Hy, DO  ?cetirizine (ZYRTEC) 10 MG tablet Take 10 mg by mouth daily.     [provider]  ?Cholecalciferol (VITAMIN D3) 125 MCG (5000 UT) CAPS Take 1 capsule (5,000 Units total) by mouth daily. ?Patient taking differently: Take 10,000 Units by mouth daily. 10/13/19   Renato Shin, MD  ?dicyclomine (BENTYL) 10 MG capsule Take 1 capsule (10 mg total) by mouth up to 3 times daily before meals and at bedtime for abdominal cramping or diarrhea.  Hold in the setting of constipation. ?Patient taking differently: as needed. Take 1 capsule (10 mg total) by mouth up to 3 times daily before meals and at bedtime for abdominal cramping or diarrhea.  Hold in the setting of constipation. 04/17/21   Erenest Rasher, PA-C  ?EPINEPHrine 0.3 mg/0.3 mL IJ SOAJ injection Inject 0.3 mg into the muscle as needed for anaphylaxis. 08/10/20   [provider]  ?EQUATE STOOL SOFTENER 100 MG capsule Take 100 mg by mouth 2 (two) times daily as needed. 08/21/21   [provider]  ?escitalopram (LEXAPRO) 20 MG tablet Take 1 tablet (20 mg total) by mouth at bedtime. 01/26/19   Renato Shin, MD  ?fluticasone Asencion Islam) 50 MCG/ACT nasal spray Place 2 sprays into both nostrils at bedtime.    [provider]  ?glucose blood (ACCU-CHEK AVIVA PLUS) test strip 1 each by Other route daily. And lancets 1/day 05/30/21   Renato Shin, MD  ?ipratropium (ATROVENT) 0.02 % nebulizer solution Take 0.5 mg by  nebulization as needed.    [provider]  ?LIDOCAINE-MENTHOL ROLL-ON EX Apply 1 application topically daily as needed (pain).    [provider]  ?Magnesium 250 MG TABS Take 250 mg by mouth at bedtime.     [provider]  ?meclizine (ANTIVERT) 25 MG tablet Take 25 mg by mouth 4 (four) times daily as needed. 10/29/21   [provider]  ?methylPREDNISolone (MEDROL DOSEPAK) 4 MG TBPK tablet See admin instructions. 12/12/21   [provider]  ?mirabegron ER (MYRBETRIQ) 50 MG TB24 tablet Take 1 tablet (50 mg total) by mouth daily. ?Patient not taking:  Reported on 10/16/2021 10/01/21   Primus Bravo., MD  ?montelukast (SINGULAIR) 10 MG tablet Take 10 mg by mouth daily. 11/13/21   [provider]  ?NON FORMULARY at bedtime. CPAP at night    [provider]  ?nystatin cream (MYCOSTATIN) Apply 1 application topically daily as needed (toe fungus). 03/15/21   [provider]  ?ondansetron (ZOFRAN ODT) 4 MG disintegrating tablet Take 1 tablet (4 mg total) by mouth every 8 (eight) hours as needed for nausea or vomiting. 07/08/21   Evalee Jefferson, PA-C  ?pantoprazole (PROTONIX) 40 MG tablet Take 1 tablet (40 mg total) by mouth 2 (two) times daily. 04/17/21   Erenest Rasher, PA-C  ?predniSONE (DELTASONE) 5 MG tablet Take 5 mg by mouth daily. 10/29/21   [provider]  ?RESTASIS 0.05 % ophthalmic emulsion Place 1 drop into both eyes 2 (two) times daily. 12/26/19   [provider]  ?rosuvastatin (CRESTOR) 10 MG tablet Take 10 mg by mouth daily. 12/05/21   [provider]  ?Simethicone 250 MG CAPS Take 500 mg by mouth at bedtime.    [provider]  ?sucralfate (CARAFATE) 1 g tablet Take 1 g by mouth as needed. 08/20/20   [provider]  ?SYNTHROID 150 MCG tablet Take 150 mcg by mouth daily. 04/06/21   [provider]  ?   ? ?Allergies    ?Levofloxacin, Toradol [ketorolac tromethamine], Tramadol, Levomenol, Other, Chamomile, Hctz [hydrochlorothiazide], Lisinopril, Losartan potassium, Penicillins, and Triamcinolone acetonide   ? ?Review of Systems   ?Review of Systems ? ?Physical Exam ?Updated Vital Signs ?BP (!) 148/88 (BP Location: Left Arm)   Pulse 78   Temp 98 ?F (36.7 ?C) (Oral)   Resp 18   Ht '5\' 4"'$  (1.626 m)   Wt 113 kg   LMP 10/09/2014   SpO2 97%   BMI 42.76 kg/m?  ?Physical Exam ?Vitals and nursing note reviewed.  ?Constitutional:   ?   Appearance: She is well-developed.  ?HENT:  ?   Head: Normocephalic and atraumatic.  ?   Nose: Nose normal. No congestion or rhinorrhea.  ?    Mouth/Throat:  ?   Mouth: Mucous membranes are moist.  ?   Pharynx: Oropharynx is clear.  ?Eyes:  ?   Pupils: Pupils are equal, round, and reactive to light.  ?Cardiovascular:  ?   Rate and Rhythm: Normal rate and regular rhythm.  ?Pulmonary:  ?   Effort: Pulmonary effort is normal. No respiratory distress.  ?   Breath sounds: No stridor.  ?Abdominal:  ?   General: Abdomen is flat. There is no distension.  ?Musculoskeletal:     ?   General: No swelling or tenderness. Normal range of motion.  ?   Cervical back: Normal range of motion.  ?Skin: ?   General: Skin is warm and dry.  ?Neurological:  ?  General: No focal deficit present.  ?   Mental Status: She is alert.  ? ? ?ED Results / Procedures / Treatments   ?Labs ?(all labs ordered are listed, but only abnormal results are displayed) ?Labs Reviewed  ?I-STAT CHEM 8, ED - Abnormal; Notable for the following components:  ?    Result Value  ? Glucose, Bld 129 (*)   ? All other components within normal limits  ? ? ?EKG ?EKG Interpretation ? ?Date/Time:  Saturday January 11 2022 23:42:42 EDT ?Ventricular Rate:  76 ?PR Interval:  159 ?QRS Duration: 77 ?QT Interval:  411 ?QTC Calculation: 463 ?R Axis:   43 ?Text Interpretation: Sinus rhythm Low voltage, precordial leads Confirmed by Merrily Pew [phone]) on 01/11/2022 11:49:44 PM ? ?Radiology ?DG Chest 2 View ? ?Result Date: 01/11/2022 ?CLINICAL DATA:  Short of breath, chest pain, asthma exacerbation EXAM: CHEST - 2 VIEW COMPARISON:  12/14/2021 FINDINGS: The heart size and mediastinal contours are within normal limits. Both lungs are clear. The visualized skeletal structures are unremarkable. IMPRESSION: No active cardiopulmonary disease. Electronically Signed   By: Randa Ngo M.D.   On: 01/11/2022 23:43   ? ?Procedures ?Procedures  ? ? ?Medications Ordered in ED ?Medications - No data to display ? ?ED Course/ Medical Decision Making/ A&P ?  ?                        ?Medical Decision Making ?Amount and/or Complexity of  Data Reviewed ?Radiology: ordered. ?ECG/medicine tests: ordered. ? ? ?Overall patient appears well.  Lungs are clear.  Heart is normal rate and rhythm murmurs.  Low suspicion for PE, pneumonia.  No significant asthma exace

## 2022-01-17 ENCOUNTER — Ambulatory Visit: Payer: Medicaid Other | Admitting: Dietician

## 2022-01-17 ENCOUNTER — Ambulatory Visit: Payer: Medicaid Other | Admitting: Endocrinology

## 2022-02-08 ENCOUNTER — Other Ambulatory Visit: Payer: Self-pay

## 2022-02-08 ENCOUNTER — Emergency Department (HOSPITAL_COMMUNITY)
Admission: EM | Admit: 2022-02-08 | Discharge: 2022-02-09 | Disposition: A | Discharge status: Home / Self Care | Payer: Medicaid Other | Attending: Emergency Medicine | Admitting: Emergency Medicine

## 2022-02-08 ENCOUNTER — Encounter (HOSPITAL_COMMUNITY): Payer: Self-pay

## 2022-02-08 ENCOUNTER — Emergency Department (HOSPITAL_COMMUNITY): Payer: Medicaid Other

## 2022-02-08 DIAGNOSIS — J45909 Unspecified asthma, uncomplicated: Secondary | ICD-10-CM | POA: Insufficient documentation

## 2022-02-08 DIAGNOSIS — Z5321 Procedure and treatment not carried out due to patient leaving prior to being seen by health care provider: Secondary | ICD-10-CM | POA: Insufficient documentation

## 2022-02-08 IMAGING — DX DG CHEST 1V
1 series · 1 of 1 positions shown · non-contrast
Comparison: [DATE]

CLINICAL DATA: Recent asthma attack while cutting grass, initial
encounter

EXAM:
CHEST  1 VIEW

[chest pa]
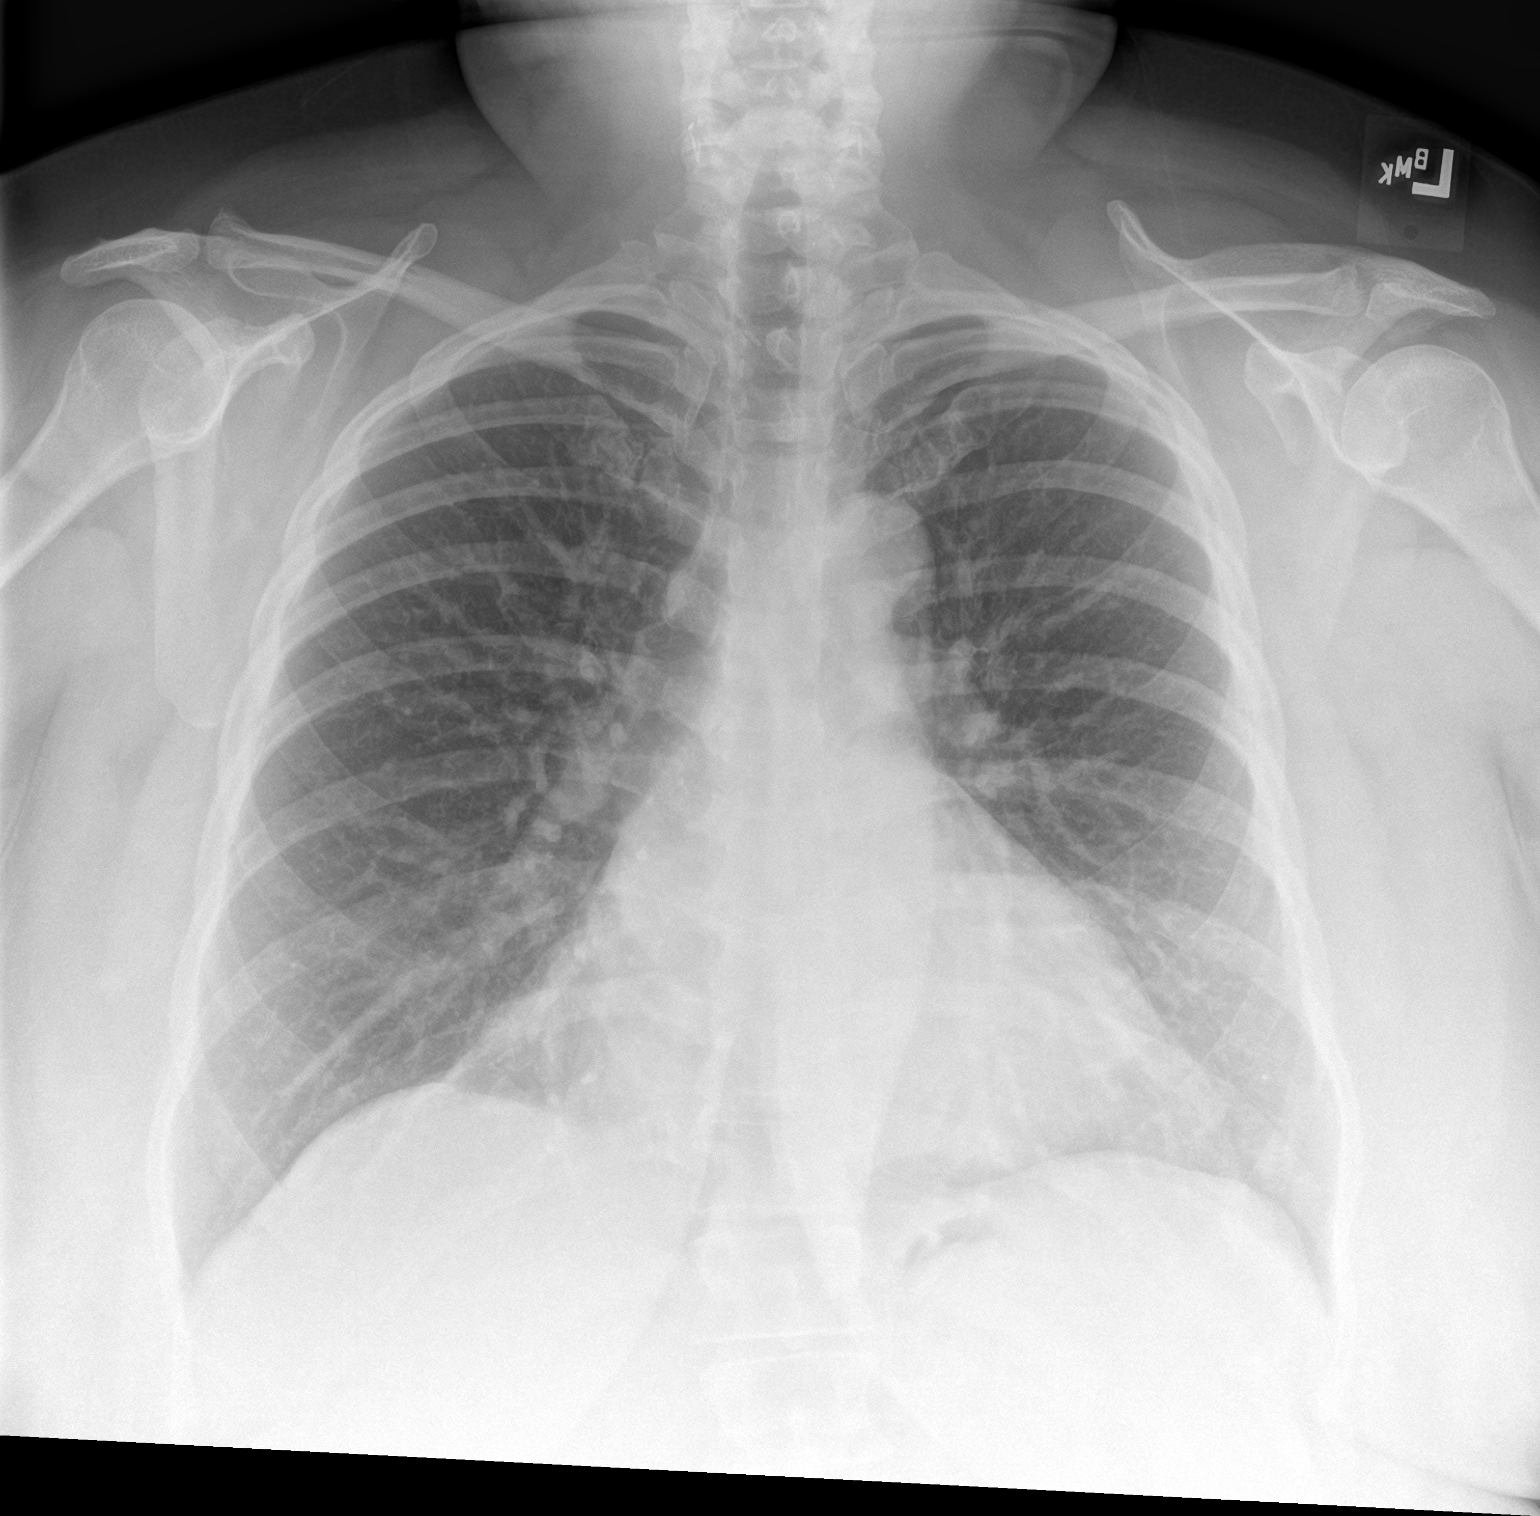

[1 of 1 positions shown; findings below may reference images not displayed]

FINDINGS: The heart size and mediastinal contours are within normal limits.
Both lungs are clear. The visualized skeletal structures are
unremarkable.
IMPRESSION: No active disease.

## 2022-02-08 NOTE — ED Triage Notes (Signed)
Pov from home. Cc of asthma flare. Her husband was mowing and the grass set her off. Used her neb at home without feeling relief. 100% in triage.  ?

## 2022-02-21 ENCOUNTER — Other Ambulatory Visit: Payer: Self-pay

## 2022-02-21 ENCOUNTER — Ambulatory Visit (INDEPENDENT_AMBULATORY_CARE_PROVIDER_SITE_OTHER): Payer: Medicaid Other | Admitting: Gastroenterology

## 2022-02-21 ENCOUNTER — Encounter: Payer: Self-pay | Admitting: Gastroenterology

## 2022-02-21 ENCOUNTER — Telehealth: Payer: Self-pay

## 2022-02-21 VITALS — BP 138/88 | HR 85 | Temp 97.7°F | Ht 64.0 in | Wt 257.8 lb

## 2022-02-21 DIAGNOSIS — R748 Abnormal levels of other serum enzymes: Secondary | ICD-10-CM | POA: Diagnosis not present

## 2022-02-21 DIAGNOSIS — K219 Gastro-esophageal reflux disease without esophagitis: Secondary | ICD-10-CM | POA: Diagnosis not present

## 2022-02-21 DIAGNOSIS — R1011 Right upper quadrant pain: Secondary | ICD-10-CM | POA: Diagnosis not present

## 2022-02-21 DIAGNOSIS — Z800 Family history of malignant neoplasm of digestive organs: Secondary | ICD-10-CM | POA: Insufficient documentation

## 2022-02-21 DIAGNOSIS — K760 Fatty (change of) liver, not elsewhere classified: Secondary | ICD-10-CM | POA: Insufficient documentation

## 2022-02-21 DIAGNOSIS — K589 Irritable bowel syndrome without diarrhea: Secondary | ICD-10-CM | POA: Diagnosis not present

## 2022-02-21 MED ORDER — PANTOPRAZOLE SODIUM 40 MG PO TBEC: 40.0000 mg | DELAYED_RELEASE_TABLET | Freq: Two times a day (BID) | ORAL | 3 refills | Status: DC

## 2022-02-21 MED ORDER — SUCRALFATE 1 GM/10ML PO SUSP: 1.0000 g | Freq: Four times a day (QID) | ORAL | 1 refills | Status: DC

## 2022-02-21 MED ORDER — CLENPIQ 10-3.5-12 MG-GM -GM/175ML PO SOLN: 1.0000 | ORAL | 0 refills | Status: DC

## 2022-02-21 NOTE — Patient Instructions (Signed)
We will be in touch next week regarding possible need for labs.  Colonoscopy to be scheduled. See separate instructions. Continue pantoprazole '40mg'$  twice daily before meals. Use sulcrafate up to four times daily as needed. Try to get back on your healthier diet to better manage your digestive issues.  Call with any questions or concerns.

## 2022-02-21 NOTE — Telephone Encounter (Signed)
PA for TCS submitted via [facility] website. Case pended. Tracking# [id number].

## 2022-02-21 NOTE — Progress Notes (Signed)
GI Office Note    Referring Provider: Redmond School, MD Primary Care Physician:  Redmond School, MD  Primary Gastroenterologist: Garfield Cornea, MD   Chief Complaint   Chief Complaint  Patient presents with   Abdominal Pain    Upper right side pain.     History of Present Illness   Heather Fry is a 46 y.o. female presenting today for follow up of GERD, IBS, abdominal pain. Last seen in 08/2021.  She has a history of chronic right upper quadrant pain.  Colonoscopy in 2018 unremarkable.  EGD from November 2021 at [facility], gastritis, biopsies negative for H. pylori, fundic gland polyps.  CT pancreatic protocol completed August 2022 with no pancreatic abnormalities.  She had diffuse hepatic steatosis and a focal hyperdense nonenhancing nodularity along the posterior aspect of the right lobe of the liver measuring 11 mm that appears stable at least since 2019 and consistent with benign/indolent process.  MRI abdomen with and without contrast August 2022 revealed 1 cm subcapsular hemorrhagic cyst in the posterior right hepatic lobe, moderate to severe hepatic steatosis, incompletely visualized cystic lesion in the upper right pelvis measuring 8 cm with inability to exclude ovarian neoplasm.  MRI pelvis with and without contrast completed September 2022 with large cystic lesion associated with the right ovary measuring up to 9.7 cm.  Differential including borderline tumor or large cyst with involution of adjacent smaller cyst.  GYN evaluation recommended.  Patient completed laparoscopic right oophorectomy November 2022.  Today: States she has been under a lot of stress.  Dad was diagnosed with colon cancer, unfortunately he developed sepsis after surgery and is currently in a SNF.  Has been pretty weak, goal of trying to get him back home.  Patient states she has been eating a lot of fast food because she is never at home.  Having intermittent right upper  quadrant pain, sometimes after meals she will feel the pain for a few minutes.  Other times she feels like a band across the upper abdomen and has gas and bloating.  She takes pantoprazole twice daily.  Recently restarted Carafate.  Uses Zofran as needed for nausea.  Denies any vomiting.  Bowel movements have been fairly good up until day prior to visit.  Stool started getting a little harder.  Still going daily.  Has not used stool softeners in quite some time.  Stools range from [place] 2-5.  No melena or rectal bleeding.  Patient is due for colonoscopy at any time because her father had colon polyps at age less than 12.  Diagnosed with colon cancer at age 55.  Paternal aunt also had colon cancer diagnosed at age 44.  In February 2023, sed rate 47, CRP 28.  Patient denies any autoimmune diagnosis.  In May 2022 sed rate 39, CRP 25.5.  RA screen negative, anti-CCP less than 5, complement C4 33, normal.  Complement C3 177 normal.  Sjogren's antibody less than 0.2 normal, ANA negative, adolase S 5.3, CK 68 normal. Wbc 8800, hemoglobin 12.9, platelets 281,000, glucose 155, BUN 10, creatinine 0.79, albumin 4.6, total bilirubin less than 0.2, alk phos 73, AST 22, ALT 28.  She has had elevated lipase in the 200 range intermittently dating back as far as 2019.  Highest lipase of 257 in July 2022.  LFTs have been normal aside from slight elevation of AST at 41 in June 2022.  History of cholecystectomy.  States that she was given steroids once and  it caused pancreatitis.        Medications   Current Outpatient Medications  Medication Sig Dispense Refill   acarbose (PRECOSE) 100 MG tablet Take 100 mg by mouth 3 (three) times daily.     acetaminophen (TYLENOL) 500 MG tablet Take 1,000 mg by mouth daily as needed for moderate pain.     albuterol (VENTOLIN HFA) 108 (90 Base) MCG/ACT inhaler Inhale 2 puffs into the lungs every 6 (six) hours as needed. 18 g 11   cetirizine (ZYRTEC) 10 MG tablet Take 10 mg by mouth  daily.     Cholecalciferol (VITAMIN D3) 125 MCG (5000 UT) CAPS Take 1 capsule (5,000 Units total) by mouth daily. (Patient taking differently: Take 10,000 Units by mouth daily.) 30 capsule 11   dicyclomine (BENTYL) 10 MG capsule Take 1 capsule (10 mg total) by mouth up to 3 times daily before meals and at bedtime for abdominal cramping or diarrhea.  Hold in the setting of constipation. (Patient taking differently: as needed. Take 1 capsule (10 mg total) by mouth up to 3 times daily before meals and at bedtime for abdominal cramping or diarrhea.  Hold in the setting of constipation.) 90 capsule 0   EPINEPHrine 0.3 mg/0.3 mL IJ SOAJ injection Inject 0.3 mg into the muscle as needed for anaphylaxis.     EQUATE STOOL SOFTENER 100 MG capsule Take 100 mg by mouth 2 (two) times daily as needed.     escitalopram (LEXAPRO) 20 MG tablet Take 1 tablet (20 mg total) by mouth at bedtime. 30 tablet 5   fluticasone (FLONASE) 50 MCG/ACT nasal spray Place 2 sprays into both nostrils at bedtime.     glimepiride (AMARYL) 1 MG tablet Take 1 mg by mouth daily.     glucose blood (ACCU-CHEK AVIVA PLUS) test strip 1 each by Other route daily. And lancets 1/day 100 each 3   ipratropium (ATROVENT) 0.02 % nebulizer solution Take 0.5 mg by nebulization as needed.     Magnesium 250 MG TABS Take 250 mg by mouth at bedtime.      meclizine (ANTIVERT) 25 MG tablet Take 25 mg by mouth 4 (four) times daily as needed.     NON FORMULARY at bedtime. CPAP at night     nystatin cream (MYCOSTATIN) Apply 1 application topically daily as needed (toe fungus).     Omega-3 Fatty Acids (FISH OIL) 1000 MG CAPS Take by mouth.     ondansetron (ZOFRAN ODT) 4 MG disintegrating tablet Take 1 tablet (4 mg total) by mouth every 8 (eight) hours as needed for nausea or vomiting. 20 tablet 0   RESTASIS 0.05 % ophthalmic emulsion Place 1 drop into both eyes 2 (two) times daily.     rosuvastatin (CRESTOR) 10 MG tablet Take 10 mg by mouth once a week.      Simethicone 250 MG CAPS Take 500 mg by mouth at bedtime.     sucralfate (CARAFATE) 1 g tablet Take 1 g by mouth as needed.     sucralfate (CARAFATE) 1 GM/10ML suspension Take 10 mLs (1 g total) by mouth 4 (four) times daily. As needed 600 mL 1   SYNTHROID 150 MCG tablet Take 150 mcg by mouth daily.     pantoprazole (PROTONIX) 40 MG tablet Take 1 tablet (40 mg total) by mouth 2 (two) times daily. 180 tablet 3   Sod Picosulfate-Mag Ox-Cit Acd (CLENPIQ) 10-3.5-12 MG-GM -GM/175ML SOLN Take 1 kit by mouth as directed. 350 mL 0   No current  facility-administered medications for this visit.    Allergies   Allergies as of 02/21/2022 - Review Complete 02/21/2022  Allergen Reaction Noted   Levofloxacin Other (See Comments) 03/03/2017   Toradol [ketorolac tromethamine] Anaphylaxis and Hives 06/08/2019   Tramadol Hives 01/11/2021   Levomenol  03/06/2020   Other Other (See Comments) 12/17/2015   Chamomile Nausea And Vomiting 03/06/2020   Hctz [hydrochlorothiazide] Other (See Comments) 06/22/2020   Lisinopril Nausea Only 01/18/2018   Losartan potassium Other (See Comments) 06/22/2020   Penicillins Other (See Comments) 04/06/2012   Triamcinolone acetonide  01/02/2021     Past Medical History   Past Medical History:  Diagnosis Date   Allergic asthma    Anemia    Anxiety    Arthritis    Depression    Diabetes mellitus without complication (HCC)    Diverticulosis    Fatty liver    moderate to severe   GERD (gastroesophageal reflux disease)    Graves disease    Headache    tension, ocular migraines   Hypothyroidism    Internal hemorrhoids    Pneumonia    as an infant   Sleep apnea    Thyroid disease    Uterine fibroid     Past Surgical History   Past Surgical History:  Procedure Laterality Date   ABDOMINAL HYSTERECTOMY     Per patient, uterine fibroids.   ABDOMINAL HYSTERECTOMY     CARPAL TUNNEL RELEASE Right 06/11/2021   Procedure: Right Carpel tunnel release;  Surgeon:  Vallarie Mare, MD;  Location: [place];  Service: Neurosurgery;  Laterality: Right;  RM 18   CHOLECYSTECTOMY N/A 11/06/2014   Procedure: LAPAROSCOPIC CHOLECYSTECTOMY;  Surgeon: Jamesetta So, MD;  Location: [facility];  Service: General;  Laterality: N/A;   COLONOSCOPY N/A 03/04/2017   Dr. Gala Romney: Hemorrhoids, diverticulosis, benign polyp without adenomatous changes.  Next colonoscopy 10 years   DILATION AND CURETTAGE OF UTERUS     ESOPHAGOGASTRODUODENOSCOPY N/A 10/02/2014   Dr. Gala Romney: fundic gland polyps, hiatal hernia   ESOPHAGOGASTRODUODENOSCOPY  07/2020   [facility] ; normal esophagus biopsied, benign-appearing gastric polyps biopsied, gastric biopsies obtained to evaluate for H. pylori, normal duodenum.  Pathology with mild chronic gastritis, negative for H. pylori, fundic gland polyps, esophageal biopsy benign.   MOUTH SURGERY     extraction of teeth   POLYPECTOMY  03/04/2017   Procedure: POLYPECTOMY;  Surgeon: Daneil Dolin, MD;  Location: [facility];  Service: Endoscopy;;  colon   THYROIDECTOMY N/A 09/30/2018   Procedure: TOTAL THYROIDECTOMY;  Surgeon: Armandina Gemma, MD;  Location: [facility];  Service: General;  Laterality: N/A;   TUBAL LIGATION      Past Family History   Family History  Problem Relation Age of Onset   Uterine cancer Mother        ?cervical cancer   Colon cancer Father 57   Hypertension Father    Diabetes Father    Colon polyps Father        age less than 55?   Uterine cancer Sister        suicide   Colon cancer Paternal Aunt 87       also had skin cancer, ovarian?   Depression Other    Thyroid disease Neg Hx    Pancreatic cancer Neg Hx     Past Social History   Social History   Socioeconomic History   Marital status: Married    Spouse name: Not on file   Number  of children: Not on file   Years of education: Not on file   Highest education level: Not on file  Occupational History   Not on file  Tobacco Use   Smoking status: Former     Packs/day: 1.00    Years: 5.00    Pack years: 5.00    Types: Cigarettes    Quit date: 11/03/1997    Years since quitting: 24.3   Smokeless tobacco: Never   Tobacco comments:    1 pack 1 week  Vaping Use   Vaping Use: Never used  Substance and Sexual Activity   Alcohol use: No    Alcohol/week: 0.0 standard drinks   Drug use: No   Sexual activity: Yes    Birth control/protection: Surgical  Other Topics Concern   Not on file  Social History Narrative   Not on file   Social Determinants of Health   Financial Resource Strain: Not on file  Food Insecurity: Not on file  Transportation Needs: Not on file  Physical Activity: Not on file  Stress: Not on file  Social Connections: Not on file  Intimate Partner Violence: Not on file    Review of Systems   General: Negative for anorexia, weight loss, fever, chills, fatigue, weakness. ENT: Negative for hoarseness, difficulty swallowing , nasal congestion. CV: Negative for chest pain, angina, palpitations, dyspnea on exertion, peripheral edema.  Respiratory: Negative for dyspnea at rest, dyspnea on exertion, cough, sputum, wheezing.  GI: See history of present illness. GU:  Negative for dysuria, hematuria, urinary incontinence, urinary frequency, nocturnal urination.  Endo: Negative for unusual weight change.     Physical Exam   BP 138/88 (BP Location: Left Arm, Patient Position: Sitting, Cuff Size: Large)   Pulse 85   Temp 97.7 F (36.5 C) (Temporal)   Ht $R'5\' 4"'Xg$  (1.626 m)   Wt 257 lb 12.8 oz (116.9 kg)   LMP 10/09/2014   SpO2 100%   BMI 44.25 kg/m    General: Well-nourished, well-developed in no acute distress.  Eyes: No icterus. Mouth: Oropharyngeal mucosa moist and pink , no lesions erythema or exudate. Lungs: Clear to auscultation bilaterally.  Heart: Regular rate and rhythm, no murmurs rubs or gallops.  Abdomen: Bowel sounds are normal,  nondistended, no hepatosplenomegaly or masses,  no abdominal bruits or hernia  , no rebound or guarding. No cva tenderness. Mild ruq tenderness. Rectal: not performed Extremities: No lower extremity edema. No clubbing or deformities. Neuro: Alert and oriented x 4   Skin: Warm and dry, no jaundice.   Psych: Alert and cooperative, normal mood and affect.  Labs   Labs from Feb 10, 2022: White blood cell count 8800, hemoglobin 12.9, platelets 281,000, glucose 155, creatinine 0.79, total bilirubin less than 0.2, alk phos 73, AST 22, ALT 28. Imaging Studies   DG Chest 1 View  Result Date: 02/08/2022 CLINICAL DATA:  Recent asthma attack while cutting grass, initial encounter EXAM: CHEST  1 VIEW COMPARISON:  01/11/2022 FINDINGS: The heart size and mediastinal contours are within normal limits. Both lungs are clear. The visualized skeletal structures are unremarkable. IMPRESSION: No active disease. Electronically Signed   By: Inez Catalina M.D.   On: 02/08/2022 23:21    Assessment   FH of Colon Cancer: patient is due for high risk screening colonoscopy.   GERD: EGD 07/2020 at [facility] with gastritis. Recently has been under a lot of stress with father's illness. Eating out/fast food more. Some flare of symptoms in this setting and restarted  carafate on top of PPI BID. No alarm symptoms.  RUQ pain: acute on chronic symptoms in setting of increased stress and eating fast food more regularly. Increased bloating. History of diffuse fatty liver on imaging. She has had intermittent elevations of lipase. States she has had pancreatitis in the past due to steroids. Details unavailable except for mild to moderate elevation of lipase as outlined. No pancreatitis on previous imaging.   IBS: doing okay at this time.  Fatty liver: most recent LFTs normal. She has had mildly elevated AST in the 40s intermittently. Encouraged healthier diet, exercise, slow gradual weight loss.      PLAN   Colonoscopy with Dr. Gala Romney. ASA 3.  I have discussed the risks, alternatives, benefits with regards to  but not limited to the risk of reaction to medication, bleeding, infection, perforation and the patient is agreeable to proceed. Written consent to be obtained. Continue pantoprazole 41m BID before meals. Use carafate up to four times daily as needed. Resume healthier eating, increase dietary fiber, avoid fatty meals.     LLaureen Ochs LBobby Rumpf [facility]

## 2022-02-25 ENCOUNTER — Other Ambulatory Visit: Payer: Self-pay

## 2022-02-25 NOTE — Telephone Encounter (Signed)
TCS approved. PA# [id number], valid 04/09/22-07/08/22.

## 2022-02-28 ENCOUNTER — Encounter: Payer: Self-pay | Admitting: Gastroenterology

## 2022-03-03 ENCOUNTER — Telehealth: Payer: Self-pay | Admitting: Gastroenterology

## 2022-03-03 NOTE — Telephone Encounter (Signed)
Please let pt know I reviewed her previous records and most recent labs. From our standpoint, I would recommend updating CMET, lipase, CBC for recent worsening of ruq pain and history of elevated lipase in the past.   Colonoscopy as planned.

## 2022-03-04 ENCOUNTER — Other Ambulatory Visit: Payer: Self-pay

## 2022-03-04 DIAGNOSIS — R748 Abnormal levels of other serum enzymes: Secondary | ICD-10-CM

## 2022-03-04 DIAGNOSIS — R1011 Right upper quadrant pain: Secondary | ICD-10-CM

## 2022-03-04 DIAGNOSIS — R7989 Other specified abnormal findings of blood chemistry: Secondary | ICD-10-CM

## 2022-03-04 NOTE — Telephone Encounter (Signed)
Pt was made aware and verbalized understanding. Labs have been ordered.

## 2022-03-06 LAB — CBC WITH DIFFERENTIAL/PLATELET
Basophils Absolute: 0 10*3/uL (ref 0.0–0.2)
Basos: 0 %
EOS (ABSOLUTE): 0.1 10*3/uL (ref 0.0–0.4)
Eos: 1 %
Hematocrit: 37.7 % (ref 34.0–46.6)
Hemoglobin: 12.5 g/dL (ref 11.1–15.9)
Immature Grans (Abs): 0 10*3/uL (ref 0.0–0.1)
Immature Granulocytes: 0 %
Lymphocytes Absolute: 2.3 10*3/uL (ref 0.7–3.1)
Lymphs: 23 %
MCH: 26.7 pg (ref 26.6–33.0)
MCHC: 33.2 g/dL (ref 31.5–35.7)
MCV: 80 fL (ref 79–97)
Monocytes Absolute: 0.5 10*3/uL (ref 0.1–0.9)
Monocytes: 5 %
Neutrophils Absolute: 7 10*3/uL (ref 1.4–7.0)
Neutrophils: 71 %
Platelets: 281 10*3/uL (ref 150–450)
RBC: 4.69 x10E6/uL (ref 3.77–5.28)
RDW: 15.3 % (ref 11.7–15.4)
WBC: 9.9 10*3/uL (ref 3.4–10.8)

## 2022-03-06 LAB — COMPREHENSIVE METABOLIC PANEL
ALT: 27 IU/L (ref 0–32)
AST: 21 IU/L (ref 0–40)
Albumin/Globulin Ratio: 1.5 (ref 1.2–2.2)
Albumin: 4.4 g/dL (ref 3.8–4.8)
Alkaline Phosphatase: 74 IU/L (ref 44–121)
BUN/Creatinine Ratio: 13 (ref 9–23)
BUN: 8 mg/dL (ref 6–24)
Bilirubin Total: <0.2 mg/dL (ref 0.0–1.2)
CO2: 24 mmol/L (ref 20–29)
Calcium: 9.5 mg/dL (ref 8.7–10.2)
Chloride: 102 mmol/L (ref 96–106)
Creatinine, Ser: 0.62 mg/dL (ref 0.57–1.00)
Globulin, Total: 2.9 g/dL (ref 1.5–4.5)
Glucose: 119 mg/dL — ABNORMAL HIGH (ref 70–99)
Potassium: 4.1 mmol/L (ref 3.5–5.2)
Sodium: 140 mmol/L (ref 134–144)
Total Protein: 7.3 g/dL (ref 6.0–8.5)
eGFR: 111 mL/min/{1.73_m2} (ref >59)

## 2022-03-06 LAB — LIPASE: Lipase: 53 U/L (ref 14–72)

## 2022-03-11 ENCOUNTER — Ambulatory Visit: Payer: Medicaid Other

## 2022-03-26 ENCOUNTER — Telehealth: Payer: Self-pay | Admitting: Internal Medicine

## 2022-03-26 NOTE — Telephone Encounter (Signed)
Pt wants to cancel her procedure with Dr Gala Romney on 04/09/2022 due to other health issues. She said she would call back later on to reschedule.

## 2022-03-26 NOTE — Telephone Encounter (Signed)
Message sent to endo to cancel for now

## 2022-04-04 ENCOUNTER — Encounter (HOSPITAL_COMMUNITY): Payer: Self-pay | Admitting: Emergency Medicine

## 2022-04-04 ENCOUNTER — Other Ambulatory Visit: Payer: Self-pay

## 2022-04-04 ENCOUNTER — Observation Stay (HOSPITAL_COMMUNITY)
Admission: EM | Admit: 2022-04-04 | Discharge: 2022-04-05 | Disposition: A | Discharge status: Home / Self Care | Payer: Medicaid Other | Attending: Internal Medicine | Admitting: Internal Medicine

## 2022-04-04 DIAGNOSIS — T7840XA Allergy, unspecified, initial encounter: Secondary | ICD-10-CM | POA: Diagnosis not present

## 2022-04-04 DIAGNOSIS — E039 Hypothyroidism, unspecified: Secondary | ICD-10-CM | POA: Diagnosis not present

## 2022-04-04 DIAGNOSIS — E1165 Type 2 diabetes mellitus with hyperglycemia: Secondary | ICD-10-CM | POA: Insufficient documentation

## 2022-04-04 DIAGNOSIS — T887XXA Unspecified adverse effect of drug or medicament, initial encounter: Secondary | ICD-10-CM | POA: Diagnosis not present

## 2022-04-04 DIAGNOSIS — Z7984 Long term (current) use of oral hypoglycemic drugs: Secondary | ICD-10-CM | POA: Diagnosis not present

## 2022-04-04 DIAGNOSIS — R197 Diarrhea, unspecified: Secondary | ICD-10-CM

## 2022-04-04 DIAGNOSIS — K219 Gastro-esophageal reflux disease without esophagitis: Secondary | ICD-10-CM | POA: Diagnosis not present

## 2022-04-04 DIAGNOSIS — J4520 Mild intermittent asthma, uncomplicated: Secondary | ICD-10-CM

## 2022-04-04 DIAGNOSIS — Z79899 Other long term (current) drug therapy: Secondary | ICD-10-CM | POA: Insufficient documentation

## 2022-04-04 DIAGNOSIS — J45909 Unspecified asthma, uncomplicated: Secondary | ICD-10-CM | POA: Diagnosis not present

## 2022-04-04 DIAGNOSIS — E66813 Obesity, class 3: Secondary | ICD-10-CM | POA: Diagnosis present

## 2022-04-04 DIAGNOSIS — T367X5A Adverse effect of antifungal antibiotics, systemically used, initial encounter: Secondary | ICD-10-CM | POA: Diagnosis not present

## 2022-04-04 DIAGNOSIS — K589 Irritable bowel syndrome without diarrhea: Secondary | ICD-10-CM

## 2022-04-04 DIAGNOSIS — Z87891 Personal history of nicotine dependence: Secondary | ICD-10-CM | POA: Diagnosis not present

## 2022-04-04 DIAGNOSIS — F32A Depression, unspecified: Secondary | ICD-10-CM | POA: Diagnosis present

## 2022-04-04 DIAGNOSIS — G4733 Obstructive sleep apnea (adult) (pediatric): Secondary | ICD-10-CM

## 2022-04-04 DIAGNOSIS — B370 Candidal stomatitis: Secondary | ICD-10-CM

## 2022-04-04 DIAGNOSIS — Z6841 Body Mass Index (BMI) 40.0 and over, adult: Secondary | ICD-10-CM | POA: Insufficient documentation

## 2022-04-04 LAB — CBC WITH DIFFERENTIAL/PLATELET
Abs Immature Granulocytes: 0.04 10*3/uL (ref 0.00–0.07)
Basophils Absolute: 0 10*3/uL (ref 0.0–0.1)
Basophils Relative: 0 %
Eosinophils Absolute: 0.1 10*3/uL (ref 0.0–0.5)
Eosinophils Relative: 1 %
HCT: 38.2 % (ref 36.0–46.0)
Hemoglobin: 12.5 g/dL (ref 12.0–15.0)
Immature Granulocytes: 0 %
Lymphocytes Relative: 18 %
Lymphs Abs: 1.8 10*3/uL (ref 0.7–4.0)
MCH: 27.1 pg (ref 26.0–34.0)
MCHC: 32.7 g/dL (ref 30.0–36.0)
MCV: 82.9 fL (ref 80.0–100.0)
Monocytes Absolute: 0.4 10*3/uL (ref 0.1–1.0)
Monocytes Relative: 4 %
Neutro Abs: 7.4 10*3/uL (ref 1.7–7.7)
Neutrophils Relative %: 77 %
Platelets: 262 10*3/uL (ref 150–400)
RBC: 4.61 MIL/uL (ref 3.87–5.11)
RDW: 15.8 % — ABNORMAL HIGH (ref 11.5–15.5)
WBC: 9.7 10*3/uL (ref 4.0–10.5)
nRBC: 0 % (ref 0.0–0.2)

## 2022-04-04 LAB — BASIC METABOLIC PANEL
Anion gap: 9 (ref 5–15)
BUN: 9 mg/dL (ref 6–20)
CO2: 24 mmol/L (ref 22–32)
Calcium: 8.9 mg/dL (ref 8.9–10.3)
Chloride: 102 mmol/L (ref 98–111)
Creatinine, Ser: 0.91 mg/dL (ref 0.44–1.00)
GFR, Estimated: >60 mL/min (ref >60)
Glucose, Bld: 188 mg/dL — ABNORMAL HIGH (ref 70–99)
Potassium: 3.5 mmol/L (ref 3.5–5.1)
Sodium: 135 mmol/L (ref 135–145)

## 2022-04-04 LAB — GLUCOSE, CAPILLARY: Glucose-Capillary: 254 mg/dL — ABNORMAL HIGH (ref 70–99)

## 2022-04-04 LAB — CBG MONITORING, ED: Glucose-Capillary: 202 mg/dL — ABNORMAL HIGH (ref 70–99)

## 2022-04-04 MED ORDER — INSULIN ASPART 100 UNIT/ML IJ SOLN
0.0000 [IU] | Freq: Every day | INTRAMUSCULAR | Status: DC
  Administered 2022-04-05: 2 [IU] via SUBCUTANEOUS

## 2022-04-04 MED ORDER — INSULIN ASPART 100 UNIT/ML IJ SOLN
0.0000 [IU] | Freq: Three times a day (TID) | INTRAMUSCULAR | Status: DC
  Administered 2022-04-05: 7 [IU] via SUBCUTANEOUS
  Administered 2022-04-05: 3 [IU] via SUBCUTANEOUS

## 2022-04-04 MED ORDER — DIPHENHYDRAMINE HCL 50 MG/ML IJ SOLN
25.0000 mg | Freq: Once | INTRAMUSCULAR | Status: AC
  Administered 2022-04-04: 25 mg via INTRAVENOUS
  Filled 2022-04-04: qty 1

## 2022-04-04 MED ORDER — DIPHENHYDRAMINE HCL 50 MG/ML IJ SOLN
50.0000 mg | Freq: Once | INTRAMUSCULAR | Status: AC
  Administered 2022-04-04: 50 mg via INTRAMUSCULAR

## 2022-04-04 MED ORDER — HYDRALAZINE HCL 20 MG/ML IJ SOLN: 5.0000 mg | Freq: Four times a day (QID) | INTRAMUSCULAR | Status: DC | PRN

## 2022-04-04 MED ORDER — DIPHENHYDRAMINE HCL 50 MG/ML IJ SOLN
25.0000 mg | Freq: Once | INTRAMUSCULAR | Status: DC
  Filled 2022-04-04: qty 1

## 2022-04-04 MED ORDER — SIMETHICONE 80 MG PO CHEW
240.0000 mg | CHEWABLE_TABLET | Freq: Two times a day (BID) | ORAL | Status: DC
  Administered 2022-04-05: 240 mg via ORAL
  Filled 2022-04-04 (×2): qty 3

## 2022-04-04 MED ORDER — ACETAMINOPHEN 650 MG RE SUPP: 650.0000 mg | Freq: Four times a day (QID) | RECTAL | Status: DC | PRN

## 2022-04-04 MED ORDER — DIPHENHYDRAMINE HCL 50 MG/ML IJ SOLN: 25.0000 mg | Freq: Once | INTRAMUSCULAR | Status: DC | PRN

## 2022-04-04 MED ORDER — SODIUM CHLORIDE 0.9 % IV BOLUS
500.0000 mL | Freq: Once | INTRAVENOUS | Status: AC
  Administered 2022-04-04: 500 mL via INTRAVENOUS

## 2022-04-04 MED ORDER — LEVOTHYROXINE SODIUM 137 MCG PO TABS
137.0000 ug | ORAL_TABLET | Freq: Every day | ORAL | Status: DC
  Administered 2022-04-05: 137 ug via ORAL
  Filled 2022-04-04: qty 1

## 2022-04-04 MED ORDER — FAMOTIDINE IN NACL 20-0.9 MG/50ML-% IV SOLN
20.0000 mg | Freq: Once | INTRAVENOUS | Status: DC | PRN
Start: 2022-04-04 — End: 2022-04-05

## 2022-04-04 MED ORDER — FAMOTIDINE IN NACL 20-0.9 MG/50ML-% IV SOLN
20.0000 mg | Freq: Once | INTRAVENOUS | Status: AC
  Administered 2022-04-04: 20 mg via INTRAVENOUS
  Filled 2022-04-04: qty 50

## 2022-04-04 MED ORDER — FLUTICASONE PROPIONATE 50 MCG/ACT NA SUSP
2.0000 | Freq: Every day | NASAL | Status: DC
  Administered 2022-04-04: 2 via NASAL
  Filled 2022-04-04: qty 16

## 2022-04-04 MED ORDER — METHYLPREDNISOLONE SODIUM SUCC 125 MG IJ SOLR
125.0000 mg | Freq: Once | INTRAMUSCULAR | Status: AC
Start: 2022-04-04 — End: 2022-04-04
  Administered 2022-04-04: 125 mg via INTRAVENOUS
  Filled 2022-04-04: qty 2

## 2022-04-04 MED ORDER — ESCITALOPRAM OXALATE 10 MG PO TABS
20.0000 mg | ORAL_TABLET | Freq: Every day | ORAL | Status: DC
Start: 2022-04-04 — End: 2022-04-05
  Administered 2022-04-04: 20 mg via ORAL
  Filled 2022-04-04: qty 2

## 2022-04-04 MED ORDER — DIPHENHYDRAMINE HCL 50 MG/ML IJ SOLN
50.0000 mg | Freq: Once | INTRAMUSCULAR | Status: DC
  Filled 2022-04-04: qty 1

## 2022-04-04 MED ORDER — ACETAMINOPHEN 325 MG PO TABS
650.0000 mg | ORAL_TABLET | Freq: Four times a day (QID) | ORAL | Status: DC | PRN
  Filled 2022-04-04: qty 2

## 2022-04-04 MED ORDER — DIPHENHYDRAMINE HCL 50 MG/ML IJ SOLN
25.0000 mg | Freq: Once | INTRAMUSCULAR | Status: AC
  Administered 2022-04-04: 25 mg via INTRAVENOUS

## 2022-04-04 MED ORDER — PANTOPRAZOLE SODIUM 40 MG PO TBEC
40.0000 mg | DELAYED_RELEASE_TABLET | Freq: Two times a day (BID) | ORAL | Status: DC
  Administered 2022-04-04 – 2022-04-05 (×2): 40 mg via ORAL
  Filled 2022-04-04 (×2): qty 1

## 2022-04-04 MED ORDER — ENOXAPARIN SODIUM 40 MG/0.4ML IJ SOSY
40.0000 mg | PREFILLED_SYRINGE | INTRAMUSCULAR | Status: DC
  Administered 2022-04-04: 40 mg via SUBCUTANEOUS
  Filled 2022-04-04: qty 0.4

## 2022-04-04 MED ORDER — EPINEPHRINE 0.3 MG/0.3ML IJ SOAJ
0.3000 mg | Freq: Once | INTRAMUSCULAR | Status: DC | PRN
Start: 2022-04-04 — End: 2022-04-05

## 2022-04-04 MED ORDER — EPINEPHRINE 0.3 MG/0.3ML IJ SOAJ
0.3000 mg | Freq: Once | INTRAMUSCULAR | Status: AC
  Administered 2022-04-04: 0.3 mg via INTRAMUSCULAR
  Filled 2022-04-04: qty 0.3

## 2022-04-04 MED ORDER — ALBUTEROL SULFATE (2.5 MG/3ML) 0.083% IN NEBU: 3.0000 mL | INHALATION_SOLUTION | Freq: Four times a day (QID) | RESPIRATORY_TRACT | Status: DC | PRN

## 2022-04-04 MED ORDER — ROSUVASTATIN CALCIUM 10 MG PO TABS
10.0000 mg | ORAL_TABLET | Freq: Every day | ORAL | Status: DC
  Administered 2022-04-05: 10 mg via ORAL
  Filled 2022-04-04: qty 1

## 2022-04-04 NOTE — ED Provider Notes (Signed)
  Physical Exam  BP (!) 169/88   Pulse (!) 107   Temp 98.7 F (37.1 C) (Oral)   Resp 15   Ht '5\' 4"'$  (1.626 m)   Wt 117.9 kg   LMP 10/09/2014   SpO2 97%   BMI 44.63 kg/m   Physical Exam Vitals and nursing note reviewed.  Constitutional:      General: She is not in acute distress.    Appearance: She is well-developed.  HENT:     Head: Normocephalic and atraumatic.     Comments: Oral thrush Eyes:     Conjunctiva/sclera: Conjunctivae normal.  Cardiovascular:     Rate and Rhythm: Normal rate and regular rhythm.     Heart sounds: No murmur heard. Pulmonary:     Effort: Pulmonary effort is normal. No respiratory distress.     Breath sounds: Normal breath sounds.  Abdominal:     Palpations: Abdomen is soft.     Tenderness: There is no abdominal tenderness.  Musculoskeletal:        General: No swelling.     Cervical back: Neck supple.  Skin:    General: Skin is warm and dry.     Capillary Refill: Capillary refill takes less than 2 seconds.  Neurological:     Mental Status: She is alert.  Psychiatric:        Mood and Affect: Mood normal.     Procedures  Procedures  ED Course / MDM    Medical Decision Making Amount and/or Complexity of Data Reviewed Labs: ordered.  Risk Prescription drug management. Decision regarding hospitalization.   Patient received in handoff.  Allergic reaction suspected secondary to swish and spit nystatin.  On physical exam, patient does have persistent thrush and I am not entirely sure if this is extending into her esophagus and causing her sensation of difficulty swallowing and abdominal pain.  I did speak with Dr. Jenetta Downer of [facility] about whether the patient would benefit from a diagnostic scope while hospitalized and he states that most of the time we just treat empirically with fluconazole.  The problem here is that the patient is on Lexapro and cannot come off this medication without side effects.  Lexapro does have a significant  interaction with the fluconazole as confirmed with our ER pharmacist which will complicate this.  The patient will require hospital overnight observation as she required 2 doses of epinephrine for the suspected allergic reaction.  Patient then admitted for observation and for further work-up of her thrush.       Teressa Lower, MD 04/04/22 [phone]

## 2022-04-04 NOTE — H&P (Signed)
History and Physical    Patient: Heather Fry ZLD:[id number] DOB: 06/15/1976 DOA: 04/04/2022 DOS: the patient was seen and examined on 04/04/2022 PCP: Redmond School, MD  Patient coming from: Home  Chief Complaint:  Chief Complaint  Patient presents with   Allergic Reaction   HPI: Heather Fry is a 46 y.o. female with medical history significant of type 2 diabetes mellitus, hypothyroidism, allergic asthma, GERD, depression, hyperlipidemia who presents to the emergency department due to an allergic reaction to a medication.  Patient was Keflex and Symbicort due to respiratory infection, she developed oral thrush and she was started on nystatin. 2 hours after taking the medication, she complained of dizziness, numbing sensation of the face and tongue, transitory blurry vision which has since normalized.  She denies nausea, vomiting, diarrhea, itchiness, oral swelling.  ED Course:  Patient was tachycardic, BP was 145/94, temperature 98.38F, respiratory rate 19/min, pulse 94 bpm, O2 sat 99% on room air.  Work-up in the ED showed normal CBC, BMP was normal except for hyperglycemia. She was treated with Benadryl x2, EpiPen x2, IV famotidine was given, 50 mg IM x1 was given, Solu-Medrol 25 mg x 1 was given, IV hydration was provided. Allergic reaction subsided.  Gastroenterologist (Dr. Jenetta Downer) was consulted by ED physician and ED recommended fluconazole which unfortunately cannot be given at this time due to interaction with Lexapro.  Hospitalist was asked to admit patient for further evaluation and management  Review of Systems: Review of systems as noted in the HPI. All other systems reviewed and are negative.   Past Medical History:  Diagnosis Date   Allergic asthma    Anemia    Anxiety    Arthritis    Depression    Diabetes mellitus without complication (HCC)    Diverticulosis    Fatty liver    moderate to severe   GERD (gastroesophageal reflux disease)    Graves disease     Headache    tension, ocular migraines   Hypothyroidism    Internal hemorrhoids    Pneumonia    as an infant   Sleep apnea    Thyroid disease    Uterine fibroid    Past Surgical History:  Procedure Laterality Date   ABDOMINAL HYSTERECTOMY     Per patient, uterine fibroids.   ABDOMINAL HYSTERECTOMY     CARPAL TUNNEL RELEASE Right 06/11/2021   Procedure: Right Carpel tunnel release;  Surgeon: Vallarie Mare, MD;  Location: [facility];  Service: Neurosurgery;  Laterality: Right;  RM 18   CHOLECYSTECTOMY N/A 11/06/2014   Procedure: LAPAROSCOPIC CHOLECYSTECTOMY;  Surgeon: Jamesetta So, MD;  Location: [facility];  Service: General;  Laterality: N/A;   COLONOSCOPY N/A 03/04/2017   Dr. Gala Romney: Hemorrhoids, diverticulosis, benign polyp without adenomatous changes.  Next colonoscopy 10 years   DILATION AND CURETTAGE OF UTERUS     ESOPHAGOGASTRODUODENOSCOPY N/A 10/02/2014   Dr. Gala Romney: fundic gland polyps, hiatal hernia   ESOPHAGOGASTRODUODENOSCOPY  07/2020   [facility] ; normal esophagus biopsied, benign-appearing gastric polyps biopsied, gastric biopsies obtained to evaluate for H. pylori, normal duodenum.  Pathology with mild chronic gastritis, negative for H. pylori, fundic gland polyps, esophageal biopsy benign.   MOUTH SURGERY     extraction of teeth   POLYPECTOMY  03/04/2017   Procedure: POLYPECTOMY;  Surgeon: Daneil Dolin, MD;  Location: [facility];  Service: Endoscopy;;  colon   RIGHT OOPHORECTOMY  07/2021   THYROIDECTOMY N/A 09/30/2018   Procedure: TOTAL THYROIDECTOMY;  Surgeon: Armandina Gemma, MD;  Location: [facility];  Service: General;  Laterality: N/A;   TUBAL LIGATION      Social History:  reports that she quit smoking about 24 years ago. Her smoking use included cigarettes. She has a 5.00 pack-year smoking history. She has never used smokeless tobacco. She reports that she does not drink alcohol and does not use drugs.   Allergies  Allergen Reactions   Levofloxacin  Other (See Comments)    Pt can not have due to taking Lexapro    Toradol [Ketorolac Tromethamine] Anaphylaxis and Hives   Tramadol Hives   Levomenol     Other reaction(s): Other   Other Other (See Comments)   Chamomile Nausea And Vomiting    Lexapro   Hctz [Hydrochlorothiazide] Other (See Comments)    Dehydration   Lisinopril Nausea Only   Losartan Potassium Other (See Comments)    GI upset   Nystatin     oral   Penicillins Other (See Comments)    Loopy, childhood allergy     Triamcinolone Acetonide     Elevated blood pressure, caused diabetes, changed tyroid electrolytes Patient has no tyroid      Family History  Problem Relation Age of Onset   Uterine cancer Mother        ?cervical cancer   Colon cancer Father 37   Hypertension Father    Diabetes Father    Colon polyps Father        age less than 73   Uterine cancer Sister        suicide   Colon cancer Paternal Aunt 46       also had skin cancer, ovarian?   Depression Other    Thyroid disease Neg Hx    Pancreatic cancer Neg Hx      Prior to Admission medications   Medication Sig Start Date End Date Taking? Authorizing Provider  acarbose (PRECOSE) 100 MG tablet Take 100 mg by mouth 3 (three) times daily. 11/01/21  Yes [provider]  acetaminophen (TYLENOL) 500 MG tablet Take 1,000 mg by mouth daily as needed for moderate pain.   Yes [provider]  albuterol (VENTOLIN HFA) 108 (90 Base) MCG/ACT inhaler Inhale 2 puffs into the lungs every 6 (six) hours as needed. 12/13/19  Yes Julian Hy, DO  cetirizine (ZYRTEC) 10 MG tablet Take 10 mg by mouth daily.   Yes [provider]  Cholecalciferol (VITAMIN D3) 125 MCG (5000 UT) CAPS Take 1 capsule (5,000 Units total) by mouth daily. Patient taking differently: Take 10,000 Units by mouth daily. 10/13/19  Yes Renato Shin, MD  dicyclomine (BENTYL) 10 MG capsule Take 1 capsule (10 mg total) by mouth up to 3 times daily before meals and at bedtime  for abdominal cramping or diarrhea.  Hold in the setting of constipation. Patient taking differently: as needed. Take 1 capsule (10 mg total) by mouth up to 3 times daily before meals and at bedtime for abdominal cramping or diarrhea.  Hold in the setting of constipation. 04/17/21  Yes Erenest Rasher, PA-C  EPINEPHrine 0.3 mg/0.3 mL IJ SOAJ injection Inject 0.3 mg into the muscle as needed for anaphylaxis. 08/10/20  Yes [provider]  EQUATE STOOL SOFTENER 100 MG capsule Take 100 mg by mouth 2 (two) times daily as needed. 08/21/21  Yes [provider]  escitalopram (LEXAPRO) 20 MG tablet Take 1 tablet (20 mg total) by mouth at bedtime. 01/26/19  Yes Renato Shin, MD  fluticasone (FLONASE) 50 MCG/ACT nasal spray Place 2 sprays into both nostrils at bedtime.   Yes [provider]  glimepiride (AMARYL) 1 MG tablet Take 1 mg by mouth daily. 01/24/22  Yes [provider]  ipratropium (ATROVENT) 0.02 % nebulizer solution Take 0.5 mg by nebulization as needed.   Yes [provider]  Magnesium 250 MG TABS Take 250 mg by mouth at bedtime.    Yes [provider]  meclizine (ANTIVERT) 25 MG tablet Take 25 mg by mouth 4 (four) times daily as needed. 10/29/21  Yes [provider]  NON FORMULARY at bedtime. CPAP at night   Yes [provider]  nystatin cream (MYCOSTATIN) Apply 1 application topically daily as needed (toe fungus). 03/15/21  Yes [provider]  pantoprazole (PROTONIX) 40 MG tablet Take 1 tablet (40 mg total) by mouth 2 (two) times daily. 02/21/22  Yes Mahala Menghini, PA-C  rosuvastatin (CRESTOR) 10 MG tablet Take 10 mg by mouth once a week. 12/05/21  Yes [provider]  Simethicone 250 MG CAPS Take 500 mg by mouth at bedtime.   Yes [provider]  sucralfate (CARAFATE) 1 GM/10ML suspension Take 10 mLs (1 g total) by mouth 4 (four) times daily. As needed 02/21/22  Yes Mahala Menghini, PA-C  SYNTHROID  137 MCG tablet Take 137 mcg by mouth daily. 03/14/22  Yes [provider]  glucose blood (ACCU-CHEK AVIVA PLUS) test strip 1 each by Other route daily. And lancets 1/day 05/30/21   Renato Shin, MD    Physical Exam: BP (!) 169/88   Pulse (!) 107   Temp 98.7 F (37.1 C) (Oral)   Resp 15   Ht '5\' 4"'$  (1.626 m)   Wt 117.9 kg   LMP 10/09/2014   SpO2 97%   BMI 44.63 kg/m   General: 46 y.o. year-old female well developed well nourished in no acute distress.  Alert and oriented x3. HEENT: NCAT, EOMI Neck: Supple, trachea medial Cardiovascular: Regular rate and rhythm with no rubs or gallops.  No thyromegaly or JVD noted.  No lower extremity edema. 2/4 pulses in all 4 extremities. Respiratory: Clear to auscultation with no wheezes or rales. Good inspiratory effort. Abdomen: Soft, nontender nondistended with normal bowel sounds x4 quadrants. Muskuloskeletal: No cyanosis, clubbing or edema noted bilaterally Neuro: CN II-XII intact, strength 5/5 x 4, sensation, reflexes intact Skin: No ulcerative lesions noted or rashes Psychiatry: Judgement and insight appear normal. Mood is appropriate for condition and setting          Labs on Admission:  Basic Metabolic Panel: Recent Labs  Lab 04/04/22 1432  NA 135  K 3.5  CL 102  CO2 24  GLUCOSE 188*  BUN 9  CREATININE 0.91  CALCIUM 8.9   Liver Function Tests: No results for input(s): "AST", "ALT", "ALKPHOS", "BILITOT", "PROT", "ALBUMIN" in the last 168 hours. No results for input(s): "LIPASE", "AMYLASE" in the last 168 hours. No results for input(s): "AMMONIA" in the last 168 hours. CBC: Recent Labs  Lab 04/04/22 1432  WBC 9.7  NEUTROABS 7.4  HGB 12.5  HCT 38.2  MCV 82.9  PLT 262   Cardiac Enzymes: No results for input(s): "CKTOTAL", "CKMB", "CKMBINDEX", "TROPONINI" in the last 168 hours.  BNP (last 3 results) No results for input(s): "BNP" in the last 8760 hours.  ProBNP (last 3 results) No results for input(s):  "PROBNP" in the last 8760 hours.  CBG: Recent Labs  Lab 04/04/22 1719  GLUCAP 202*  Radiological Exams on Admission: No results found.  EKG: I independently viewed the EKG done and my findings are as followed: Normal sinus rhythm at a rate of 87 bpm  Assessment/Plan Present on Admission:  Allergic reaction caused by a drug  GERD (gastroesophageal reflux disease)  Asthma without status asthmaticus  Obesity, Class III, BMI 40-49.9 (morbid obesity) ([place])  Depression  Principal Problem:   Allergic reaction caused by a drug Active Problems:   Depression   Acquired hypothyroidism   GERD (gastroesophageal reflux disease)   Obesity, Class III, BMI 40-49.9 (morbid obesity) (HCC)   Asthma without status asthmaticus   Oral thrush   Type 2 diabetes mellitus with hyperglycemia (HCC)  Allergic reaction caused by nystatin Patient's allergic reaction have subsided Continue Benadryl as needed, EpiPen as needed, Pepcid as needed for recurrence of allergic reaction Continue to monitor patient and treat accordingly  Oral thrush Patient developed allergic reaction to nystatin She cannot have fluconazole due to interaction with home Lexapro Pharmacy was consulted and the suggestion was if patient will be able to hold off on Lexapro while on Diflucan, however, patient endorsed an unpleasant prior withdrawal symptoms in the past.  Type 2 diabetes mellitus with hyperglycemia Continue ISS and hypoglycemic protocol Glimepiride will be held at this time  Acquired hypothyroidism Continue Synthroid  Allergic asthma Continue Flonase, albuterol  GERD Continue Protonix  Depression Continue Lexapro  Mixed hyperlipidemia  Continue Crestor  Obesity class 3 (BMI 44.63 kg/m) Diet and lifestyle modification Patient will benefit from outpatient follow-up with PCP for weight loss program  DVT prophylaxis: Lovenox  Code Status: Full code  Consults: None  Family Communication: Mother  in law at bedside (all questions answered to satisfaction)  Severity of Illness: The appropriate patient status for this patient is OBSERVATION. Observation status is judged to be reasonable and necessary in order to provide the required intensity of service to ensure the patient's safety. The patient's presenting symptoms, physical exam findings, and initial radiographic and laboratory data in the context of their medical condition is felt to place them at decreased risk for further clinical deterioration. Furthermore, it is anticipated that the patient will be medically stable for discharge from the hospital within 2 midnights of admission.   Author: Bernadette Hoit, DO 04/04/2022 8:09 PM  For on call review [email or web address].

## 2022-04-04 NOTE — ED Provider Notes (Signed)
[facility] EMERGENCY DEPARTMENT Provider Note   CSN: [id number] Arrival date & time: 04/04/22  1350     History  Chief Complaint  Patient presents with   Allergic Reaction    Heather Fry is a 46 y.o. female.  Patient states she took some nystatin today and started having a rash to her face and feels like she is swelling.  Patient has history of diabetes and she was taking Keflex and Symbicort for respiratory infection.  The history is provided by the patient and medical records. No language interpreter was used.  Allergic Reaction Presenting symptoms: difficulty breathing, itching and rash   Presenting symptoms: no difficulty swallowing   Severity:  Moderate Prior allergic episodes:  No prior episodes Context: not animal exposure   Relieved by:  Nothing Worsened by:  Nothing Ineffective treatments:  None tried      Home Medications Prior to Admission medications   Medication Sig Start Date End Date Taking? Authorizing Provider  acarbose (PRECOSE) 100 MG tablet Take 100 mg by mouth 3 (three) times daily. 11/01/21   [provider]  acetaminophen (TYLENOL) 500 MG tablet Take 1,000 mg by mouth daily as needed for moderate pain.    [provider]  albuterol (VENTOLIN HFA) 108 (90 Base) MCG/ACT inhaler Inhale 2 puffs into the lungs every 6 (six) hours as needed. 12/13/19   Julian Hy, DO  cetirizine (ZYRTEC) 10 MG tablet Take 10 mg by mouth daily.    [provider]  Cholecalciferol (VITAMIN D3) 125 MCG (5000 UT) CAPS Take 1 capsule (5,000 Units total) by mouth daily. Patient taking differently: Take 10,000 Units by mouth daily. 10/13/19   Renato Shin, MD  dicyclomine (BENTYL) 10 MG capsule Take 1 capsule (10 mg total) by mouth up to 3 times daily before meals and at bedtime for abdominal cramping or diarrhea.  Hold in the setting of constipation. Patient taking differently: as needed. Take 1 capsule (10 mg total) by mouth up to 3 times daily  before meals and at bedtime for abdominal cramping or diarrhea.  Hold in the setting of constipation. 04/17/21   Erenest Rasher, PA-C  EPINEPHrine 0.3 mg/0.3 mL IJ SOAJ injection Inject 0.3 mg into the muscle as needed for anaphylaxis. 08/10/20   [provider]  EQUATE STOOL SOFTENER 100 MG capsule Take 100 mg by mouth 2 (two) times daily as needed. 08/21/21   [provider]  escitalopram (LEXAPRO) 20 MG tablet Take 1 tablet (20 mg total) by mouth at bedtime. 01/26/19   Renato Shin, MD  fluticasone (FLONASE) 50 MCG/ACT nasal spray Place 2 sprays into both nostrils at bedtime.    [provider]  glimepiride (AMARYL) 1 MG tablet Take 1 mg by mouth daily. 01/24/22   [provider]  glucose blood (ACCU-CHEK AVIVA PLUS) test strip 1 each by Other route daily. And lancets 1/day 05/30/21   Renato Shin, MD  ipratropium (ATROVENT) 0.02 % nebulizer solution Take 0.5 mg by nebulization as needed.    [provider]  Magnesium 250 MG TABS Take 250 mg by mouth at bedtime.     [provider]  meclizine (ANTIVERT) 25 MG tablet Take 25 mg by mouth 4 (four) times daily as needed. 10/29/21   [provider]  NON FORMULARY at bedtime. CPAP at night    [provider]  nystatin cream (MYCOSTATIN) Apply 1 application topically daily as needed (toe fungus). 03/15/21   [provider]  Omega-3 Fatty  Acids (FISH OIL) 1000 MG CAPS Take by mouth.    [provider]  ondansetron (ZOFRAN ODT) 4 MG disintegrating tablet Take 1 tablet (4 mg total) by mouth every 8 (eight) hours as needed for nausea or vomiting. 07/08/21   Evalee Jefferson, PA-C  pantoprazole (PROTONIX) 40 MG tablet Take 1 tablet (40 mg total) by mouth 2 (two) times daily. 02/21/22   Mahala Menghini, PA-C  RESTASIS 0.05 % ophthalmic emulsion Place 1 drop into both eyes 2 (two) times daily. 12/26/19   [provider]  rosuvastatin (CRESTOR) 10 MG tablet Take 10 mg by  mouth once a week. 12/05/21   [provider]  Simethicone 250 MG CAPS Take 500 mg by mouth at bedtime.    [provider]  Sod Picosulfate-Mag Ox-Cit Acd (CLENPIQ) 10-3.5-12 MG-GM -GM/175ML SOLN Take 1 kit by mouth as directed. 02/21/22   Rourk, Cristopher Estimable, MD  sucralfate (CARAFATE) 1 g tablet Take 1 g by mouth as needed. 08/20/20   [provider]  sucralfate (CARAFATE) 1 GM/10ML suspension Take 10 mLs (1 g total) by mouth 4 (four) times daily. As needed 02/21/22   Mahala Menghini, PA-C  SYNTHROID 150 MCG tablet Take 150 mcg by mouth daily. 04/06/21   [provider]      Allergies    Levofloxacin, Toradol [ketorolac tromethamine], Tramadol, Levomenol, Other, Chamomile, Hctz [hydrochlorothiazide], Lisinopril, Losartan potassium, Nystatin, Penicillins, and Triamcinolone acetonide    Review of Systems   Review of Systems  Constitutional:  Negative for appetite change and fatigue.  HENT:  Negative for congestion, ear discharge, sinus pressure and trouble swallowing.   Eyes:  Negative for discharge.  Respiratory:  Negative for cough.   Cardiovascular:  Negative for chest pain.  Gastrointestinal:  Negative for abdominal pain and diarrhea.  Genitourinary:  Negative for frequency and hematuria.  Musculoskeletal:  Negative for back pain.  Skin:  Positive for itching and rash.  Neurological:  Negative for seizures and headaches.  Psychiatric/Behavioral:  Negative for hallucinations.     Physical Exam Updated Vital Signs BP (!) 145/85   Pulse (!) 103   Temp 98.7 F (37.1 C) (Oral)   Resp 17   Ht $R'5\' 4"'wk$  (1.626 m)   Wt 117.9 kg   LMP 10/09/2014   SpO2 100%   BMI 44.63 kg/m  Physical Exam Vitals and nursing note reviewed.  Constitutional:      Appearance: She is well-developed.  HENT:     Head: Normocephalic.     Nose: Nose normal.     Mouth/Throat:     Comments: Oropharynx shows no swelling Eyes:     General: No scleral icterus.     Conjunctiva/sclera: Conjunctivae normal.  Neck:     Thyroid: No thyromegaly.     Comments: Patient has significant swelling to anterior neck with rash and induration Cardiovascular:     Rate and Rhythm: Normal rate and regular rhythm.     Heart sounds: No murmur heard.    No friction rub. No gallop.  Pulmonary:     Breath sounds: No stridor. No wheezing or rales.  Chest:     Chest wall: No tenderness.  Abdominal:     General: There is no distension.     Tenderness: There is no abdominal tenderness. There is no rebound.  Musculoskeletal:        General: Normal range of motion.     Cervical back: Neck supple.  Lymphadenopathy:     Cervical: No cervical  adenopathy.  Skin:    Findings: Rash present. No erythema.  Neurological:     Mental Status: She is alert and oriented to person, place, and time.     Motor: No abnormal muscle tone.     Coordination: Coordination normal.  Psychiatric:        Behavior: Behavior normal.     ED Results / Procedures / Treatments   Labs (all labs ordered are listed, but only abnormal results are displayed) Labs Reviewed  CBC WITH DIFFERENTIAL/PLATELET - Abnormal; Notable for the following components:      Result Value   RDW 15.8 (*)    All other components within normal limits  BASIC METABOLIC PANEL - Abnormal; Notable for the following components:   Glucose, Bld 188 (*)    All other components within normal limits    EKG None  Radiology No results found.  Procedures Procedures    Medications Ordered in ED Medications  EPINEPHrine (EPI-PEN) injection 0.3 mg (0.3 mg Intramuscular Given 04/04/22 1437)  methylPREDNISolone sodium succinate (SOLU-MEDROL) 125 mg/2 mL injection 125 mg (125 mg Intravenous Given 04/04/22 1447)  famotidine (PEPCID) IVPB 20 mg premix (20 mg Intravenous New Bag/Given 04/04/22 1455)  sodium chloride 0.9 % bolus 500 mL (0 mLs Intravenous Stopped 04/04/22 1522)  diphenhydrAMINE (BENADRYL) injection 50 mg (50 mg  Intramuscular Given 04/04/22 1440)  diphenhydrAMINE (BENADRYL) injection 25 mg (25 mg Intravenous Given 04/04/22 1459)  EPINEPHrine (EPI-PEN) injection 0.3 mg (0.3 mg Intramuscular Given 04/04/22 1511)    ED Course/ Medical Decision Making/ A&P  CRITICAL CARE Performed by: Milton Ferguson Total critical care time: 45 minutes Critical care time was exclusive of separately billable procedures and treating other patients. Critical care was necessary to treat or prevent imminent or life-threatening deterioration. Critical care was time spent personally by me on the following activities: development of treatment plan with patient and/or surrogate as well as nursing, discussions with consultants, evaluation of patient's response to treatment, examination of patient, obtaining history from patient or surrogate, ordering and performing treatments and interventions, ordering and review of laboratory studies, ordering and review of radiographic studies, pulse oximetry and re-evaluation of patient's condition.   Patient with allergic reaction.  She was given epi twice and Benadryl Solu-Medrol and Pepcid and seems to be improving.  She still has swelling to her neck but not in her oropharynx.  The rash to her neck improved.                         Medical Decision Making Amount and/or Complexity of Data Reviewed Labs: ordered.  Risk Prescription drug management.   Patient with significant allergic reaction..  Patient improving.  Patient will be cared for by Dr. Tinnie Gens and disposition will be determined by Dr. Tinnie Gens        Final Clinical Impression(s) / ED Diagnoses Final diagnoses:  None    Rx / DC Orders ED Discharge Orders     None         Milton Ferguson, MD 04/04/22 1652

## 2022-04-04 NOTE — ED Triage Notes (Signed)
Just started nystatin oral for thrush2 hours ago,  states she is now having an allergic reaction, c/o dizziness/face and tongue feels numb, vision went blurry but normal now. Denies itching/n/v/d. A/o in triage. Nad. No resp distres or sob noted. No oral swelling noted.

## 2022-04-05 DIAGNOSIS — T7840XD Allergy, unspecified, subsequent encounter: Secondary | ICD-10-CM

## 2022-04-05 DIAGNOSIS — J4520 Mild intermittent asthma, uncomplicated: Secondary | ICD-10-CM | POA: Diagnosis not present

## 2022-04-05 DIAGNOSIS — F32A Depression, unspecified: Secondary | ICD-10-CM

## 2022-04-05 DIAGNOSIS — E039 Hypothyroidism, unspecified: Secondary | ICD-10-CM | POA: Diagnosis not present

## 2022-04-05 LAB — HIV ANTIBODY (ROUTINE TESTING W REFLEX): HIV Screen 4th Generation wRfx: NONREACTIVE

## 2022-04-05 LAB — COMPREHENSIVE METABOLIC PANEL
ALT: 25 U/L (ref 0–44)
AST: 16 U/L (ref 15–41)
Albumin: 3.7 g/dL (ref 3.5–5.0)
Alkaline Phosphatase: 59 U/L (ref 38–126)
Anion gap: 9 (ref 5–15)
BUN: 12 mg/dL (ref 6–20)
CO2: 23 mmol/L (ref 22–32)
Calcium: 8.5 mg/dL — ABNORMAL LOW (ref 8.9–10.3)
Chloride: 104 mmol/L (ref 98–111)
Creatinine, Ser: 0.7 mg/dL (ref 0.44–1.00)
GFR, Estimated: >60 mL/min (ref >60)
Glucose, Bld: 234 mg/dL — ABNORMAL HIGH (ref 70–99)
Potassium: 3.7 mmol/L (ref 3.5–5.1)
Sodium: 136 mmol/L (ref 135–145)
Total Bilirubin: 0.2 mg/dL — ABNORMAL LOW (ref 0.3–1.2)
Total Protein: 7.4 g/dL (ref 6.5–8.1)

## 2022-04-05 LAB — CBC
HCT: 38 % (ref 36.0–46.0)
Hemoglobin: 12.4 g/dL (ref 12.0–15.0)
MCH: 27.4 pg (ref 26.0–34.0)
MCHC: 32.6 g/dL (ref 30.0–36.0)
MCV: 83.9 fL (ref 80.0–100.0)
Platelets: 274 10*3/uL (ref 150–400)
RBC: 4.53 MIL/uL (ref 3.87–5.11)
RDW: 15.9 % — ABNORMAL HIGH (ref 11.5–15.5)
WBC: 13.4 10*3/uL — ABNORMAL HIGH (ref 4.0–10.5)
nRBC: 0 % (ref 0.0–0.2)

## 2022-04-05 LAB — PHOSPHORUS: Phosphorus: 3.8 mg/dL (ref 2.5–4.6)

## 2022-04-05 LAB — HEMOGLOBIN A1C
Hgb A1c MFr Bld: 7.3 % — ABNORMAL HIGH (ref 4.8–5.6)
Mean Plasma Glucose: 162.81 mg/dL

## 2022-04-05 LAB — MAGNESIUM: Magnesium: 2.1 mg/dL (ref 1.7–2.4)

## 2022-04-05 LAB — GLUCOSE, CAPILLARY
Glucose-Capillary: 205 mg/dL — ABNORMAL HIGH (ref 70–99)
Glucose-Capillary: 212 mg/dL — ABNORMAL HIGH (ref 70–99)
Glucose-Capillary: 137 mg/dL — ABNORMAL HIGH (ref 70–99)

## 2022-04-05 MED ORDER — ENSURE ENLIVE PO LIQD: 237.0000 mL | Freq: Two times a day (BID) | ORAL | Status: DC

## 2022-04-05 MED ORDER — DICYCLOMINE HCL 10 MG PO CAPS: 10.0000 mg | ORAL_CAPSULE | Freq: Three times a day (TID) | ORAL | Status: DC | PRN

## 2022-04-05 MED ORDER — ENOXAPARIN SODIUM 60 MG/0.6ML IJ SOSY: 60.0000 mg | PREFILLED_SYRINGE | INTRAMUSCULAR | Status: DC

## 2022-04-05 NOTE — Assessment & Plan Note (Signed)
Continue Synthroid °

## 2022-04-05 NOTE — Assessment & Plan Note (Signed)
-   Continue home bronchodilator management -No wheezing or signs of acute exacerbation during this hospitalization.

## 2022-04-05 NOTE — Assessment & Plan Note (Signed)
-   Mild and no affecting the back of her throat currently -Patient without difficulty swallowing -Expressed interest in following home remedies and avoid medications that could interact with her Lexapro. -Good oral hygiene has been emphasized.

## 2022-04-05 NOTE — Assessment & Plan Note (Signed)
-   Modified carbohydrate diet encouraged. -Resume home hypoglycemic regimen -Maintain adequate hydration.

## 2022-04-05 NOTE — Assessment & Plan Note (Signed)
-  Body mass index is 44.39 kg/m. -Low calorie diet, portion control and increase physical activity discussed with patient. -Patient will benefit outpatient follow-up with bariatric clinic.

## 2022-04-05 NOTE — Assessment & Plan Note (Signed)
-  No suicidal ideation or hallucination -Stable mood -Continue home antidepressant regimen.

## 2022-04-05 NOTE — Progress Notes (Signed)
Heather Fry  Admit Date:  04/04/2022 Discharge date: 04/05/2022   Heather Fry to be D/C'd Home per MD order.  AVS completed. Patient/caregiver able to verbalize understanding.  Discharge Medication: Allergies as of 04/05/2022       Reactions   Levofloxacin Other (See Comments)   Pt can not have due to taking Lexapro    Toradol [ketorolac Tromethamine] Anaphylaxis, Hives   Tramadol Hives   Levomenol    Other reaction(s): Other   Other Other (See Comments)   Chamomile Nausea And Vomiting   Lexapro   Hctz [hydrochlorothiazide] Other (See Comments)   Dehydration   Lisinopril Nausea Only   Losartan Potassium Other (See Comments)   GI upset   Nystatin    oral   Penicillins Other (See Comments)   Loopy, childhood allergy   Triamcinolone Acetonide    Elevated blood pressure, caused diabetes, changed tyroid electrolytes Patient has no tyroid          Medication List     TAKE these medications    acarbose 100 MG tablet Commonly known as: PRECOSE Take 100 mg by mouth 3 (three) times daily.   Accu-Chek Aviva Plus test strip Generic drug: glucose blood 1 each by Other route daily. And lancets 1/day   acetaminophen 500 MG tablet Commonly known as: TYLENOL Take 1,000 mg by mouth daily as needed for moderate pain.   albuterol 108 (90 Base) MCG/ACT inhaler Commonly known as: VENTOLIN HFA Inhale 2 puffs into the lungs every 6 (six) hours as needed.   cetirizine 10 MG tablet Commonly known as: ZYRTEC Take 10 mg by mouth daily.   dicyclomine 10 MG capsule Commonly known as: Bentyl Take 1 capsule (10 mg total) by mouth every 8 (eight) hours as needed for spasms. Take 1 capsule (10 mg total) by mouth up to 3 times daily before meals and at bedtime for abdominal cramping or diarrhea.  Hold in the setting of constipation. What changed:  how much to take how to take this when to take this reasons to take this   EPINEPHrine 0.3 mg/0.3 mL Soaj injection Commonly known  as: EPI-PEN Inject 0.3 mg into the muscle as needed for anaphylaxis.   EQUATE STOOL SOFTENER 100 MG capsule Generic drug: docusate sodium Take 100 mg by mouth 2 (two) times daily as needed.   escitalopram 20 MG tablet Commonly known as: LEXAPRO Take 1 tablet (20 mg total) by mouth at bedtime.   fluticasone 50 MCG/ACT nasal spray Commonly known as: FLONASE Place 2 sprays into both nostrils at bedtime.   glimepiride 1 MG tablet Commonly known as: AMARYL Take 1 mg by mouth daily.   ipratropium 0.02 % nebulizer solution Commonly known as: ATROVENT Take 0.5 mg by nebulization as needed.   Magnesium 250 MG Tabs Take 250 mg by mouth at bedtime.   meclizine 25 MG tablet Commonly known as: ANTIVERT Take 25 mg by mouth 4 (four) times daily as needed.   NON FORMULARY at bedtime. CPAP at night   nystatin cream Commonly known as: MYCOSTATIN Apply 1 application topically daily as needed (toe fungus).   pantoprazole 40 MG tablet Commonly known as: PROTONIX Take 1 tablet (40 mg total) by mouth 2 (two) times daily.   rosuvastatin 10 MG tablet Commonly known as: CRESTOR Take 10 mg by mouth once a week.   Simethicone 250 MG Caps Take 500 mg by mouth at bedtime.   sucralfate 1 GM/10ML suspension Commonly known as: CARAFATE Take 10 mLs (  1 g total) by mouth 4 (four) times daily. As needed   Synthroid 137 MCG tablet Generic drug: levothyroxine Take 137 mcg by mouth daily.   Vitamin D3 125 MCG (5000 UT) Caps Take 1 capsule (5,000 Units total) by mouth daily. What changed: how much to take        Discharge Assessment: Vitals:   04/05/22 0922 04/05/22 1413  BP: 122/70 140/87  Pulse: 84 82  Resp:    Temp:    SpO2: 99% 100%   Skin clean, dry and intact without evidence of skin break down, no evidence of skin tears noted. IV catheter discontinued intact. Site without signs and symptoms of complications - no redness or edema noted at insertion site, patient denies c/o pain  - only slight tenderness at site.  Dressing with slight pressure applied.  D/c Instructions-Education: Discharge instructions given to patient/family with verbalized understanding. D/c education completed with patient/family including follow up instructions, medication list, d/c activities limitations if indicated, with other d/c instructions as indicated by MD - patient able to verbalize understanding, all questions fully answered. Patient instructed to return to ED, call 911, or call MD for any changes in condition.  Patient escorted via [place], and D/C home via private auto.  Tsosie Billing, LPN 12/01/5972 1:63 PM

## 2022-04-05 NOTE — Assessment & Plan Note (Signed)
Continue PPI ?

## 2022-04-05 NOTE — Assessment & Plan Note (Signed)
Continue CPAP nightly. °

## 2022-04-05 NOTE — Assessment & Plan Note (Signed)
-   Status post allergic reaction to the use of nystatin -No further progression throughout the night after receiving steroids, Pepcid and Benadryl. -No throat discomfort, shortness of breath, swelling sensation or any other complaints.  Patient feeling ready to go home. -Nystatin discontinued discharge medication placed on her allergy list.

## 2022-04-05 NOTE — Discharge Summary (Signed)
Physician Discharge Summary   Patient: Heather Fry MRN: [id number] DOB: Jul 28, 1976  Admit date:     04/04/2022  Discharge date: 04/05/22  Discharge Physician: Barton Dubois   PCP: Redmond School, MD   Recommendations at discharge:  Reassess complete clearance of oral thrush Repeat basic metabolic panel to follow ultralights renal function Continue assisting patient with weight loss management.  Discharge Diagnoses: Principal Problem:   Allergic reaction caused by a drug Active Problems:   Depression   Acquired hypothyroidism   GERD (gastroesophageal reflux disease)   Obesity, Class III, BMI 40-49.9 (morbid obesity) ([place])   Asthma without status asthmaticus   Oral thrush   Type 2 diabetes mellitus with hyperglycemia [facility])   Brief Hospital admission course: As per H&P written by Dr. Josephine Cables on 04/04/2022 Heather Fry is a 46 y.o. female with medical history significant of type 2 diabetes mellitus, hypothyroidism, allergic asthma, GERD, depression, hyperlipidemia who presents to the emergency department due to an allergic reaction to a medication.  Patient was Keflex and Symbicort due to respiratory infection, she developed oral thrush and she was started on nystatin. 2 hours after taking the medication, she complained of dizziness, numbing sensation of the face and tongue, transitory blurry vision which has since normalized.  She denies nausea, vomiting, diarrhea, itchiness, oral swelling.   ED Course:  Patient was tachycardic, BP was 145/94, temperature 98.88F, respiratory rate 19/min, pulse 94 bpm, O2 sat 99% on room air.  Work-up in the ED showed normal CBC, BMP was normal except for hyperglycemia. She was treated with Benadryl x2, EpiPen x2, IV famotidine was given, 50 mg IM x1 was given, Solu-Medrol 25 mg x 1 was given, IV hydration was provided. Allergic reaction subsided.  Gastroenterologist (Dr. Jenetta Downer) was curbside by ED physician and recommendations received was for  fluconazole which unfortunately cannot be given at this time due to interaction with Lexapro.  Hospitalist was asked to admit patient for further evaluation and management  Assessment and Plan: * Allergic reaction caused by a drug - Status post allergic reaction to the use of nystatin -No further progression throughout the night after receiving steroids, Pepcid and Benadryl. -No throat discomfort, shortness of breath, swelling sensation or any other complaints.  Patient feeling ready to go home. -Nystatin discontinued discharge medication placed on her allergy list.  Type 2 diabetes mellitus with hyperglycemia ([facility]) - Modified carbohydrate diet encouraged. -Resume home hypoglycemic regimen -Maintain adequate hydration.  Oral thrush - Mild and no affecting the back of her throat currently -Patient without difficulty swallowing -Expressed interest in following home remedies and avoid medications that could interact with her Lexapro. -Good oral hygiene has been emphasized.  Asthma without status asthmaticus - Continue home bronchodilator management -No wheezing or signs of acute exacerbation during this hospitalization.  OSA (obstructive sleep apnea) - Continue CPAP nightly.  Obesity, Class III, BMI 40-49.9 (morbid obesity) ([facility]) -Body mass index is 44.39 kg/m. -Low calorie diet, portion control and increase physical activity discussed with patient. -Patient will benefit outpatient follow-up with bariatric clinic.  GERD (gastroesophageal reflux disease) -Continue PPI.  Acquired hypothyroidism - Continue Synthroid.  Depression -No suicidal ideation or hallucination -Stable mood -Continue home antidepressant regimen.   Consultants: GI curbside by EDP. Procedures performed: None Disposition: Home Diet recommendation: Low calorie diet, modified carbohydrate diet and heart healthy diet.  DISCHARGE MEDICATION: Allergies as of 04/05/2022       Reactions   Levofloxacin Other  (See Comments)   Pt can not have due  to taking Lexapro    Toradol [ketorolac Tromethamine] Anaphylaxis, Hives   Tramadol Hives   Levomenol    Other reaction(s): Other   Other Other (See Comments)   Chamomile Nausea And Vomiting   Lexapro   Hctz [hydrochlorothiazide] Other (See Comments)   Dehydration   Lisinopril Nausea Only   Losartan Potassium Other (See Comments)   GI upset   Nystatin    oral   Penicillins Other (See Comments)   Loopy, childhood allergy   Triamcinolone Acetonide    Elevated blood pressure, caused diabetes, changed tyroid electrolytes Patient has no tyroid          Medication List     TAKE these medications    acarbose 100 MG tablet Commonly known as: PRECOSE Take 100 mg by mouth 3 (three) times daily.   Accu-Chek Aviva Plus test strip Generic drug: glucose blood 1 each by Other route daily. And lancets 1/day   acetaminophen 500 MG tablet Commonly known as: TYLENOL Take 1,000 mg by mouth daily as needed for moderate pain.   albuterol 108 (90 Base) MCG/ACT inhaler Commonly known as: VENTOLIN HFA Inhale 2 puffs into the lungs every 6 (six) hours as needed.   cetirizine 10 MG tablet Commonly known as: ZYRTEC Take 10 mg by mouth daily.   dicyclomine 10 MG capsule Commonly known as: Bentyl Take 1 capsule (10 mg total) by mouth every 8 (eight) hours as needed for spasms. Take 1 capsule (10 mg total) by mouth up to 3 times daily before meals and at bedtime for abdominal cramping or diarrhea.  Hold in the setting of constipation. What changed:  how much to take how to take this when to take this reasons to take this   EPINEPHrine 0.3 mg/0.3 mL Soaj injection Commonly known as: EPI-PEN Inject 0.3 mg into the muscle as needed for anaphylaxis.   EQUATE STOOL SOFTENER 100 MG capsule Generic drug: docusate sodium Take 100 mg by mouth 2 (two) times daily as needed.   escitalopram 20 MG tablet Commonly known as: LEXAPRO Take 1 tablet (20 mg  total) by mouth at bedtime.   fluticasone 50 MCG/ACT nasal spray Commonly known as: FLONASE Place 2 sprays into both nostrils at bedtime.   glimepiride 1 MG tablet Commonly known as: AMARYL Take 1 mg by mouth daily.   ipratropium 0.02 % nebulizer solution Commonly known as: ATROVENT Take 0.5 mg by nebulization as needed.   Magnesium 250 MG Tabs Take 250 mg by mouth at bedtime.   meclizine 25 MG tablet Commonly known as: ANTIVERT Take 25 mg by mouth 4 (four) times daily as needed.   NON FORMULARY at bedtime. CPAP at night   nystatin cream Commonly known as: MYCOSTATIN Apply 1 application topically daily as needed (toe fungus).   pantoprazole 40 MG tablet Commonly known as: PROTONIX Take 1 tablet (40 mg total) by mouth 2 (two) times daily.   rosuvastatin 10 MG tablet Commonly known as: CRESTOR Take 10 mg by mouth once a week.   Simethicone 250 MG Caps Take 500 mg by mouth at bedtime.   sucralfate 1 GM/10ML suspension Commonly known as: CARAFATE Take 10 mLs (1 g total) by mouth 4 (four) times daily. As needed   Synthroid 137 MCG tablet Generic drug: levothyroxine Take 137 mcg by mouth daily.   Vitamin D3 125 MCG (5000 UT) Caps Take 1 capsule (5,000 Units total) by mouth daily. What changed: how much to take        Follow-up  Information     Redmond School, MD. Schedule an appointment as soon as possible for a visit in 1 week(s).   Specialty: Internal Medicine Contact information: [address] [place] [address] [phone]                Discharge Exam: Danley Danker Weights   04/04/22 1412 04/04/22 2104  Weight: 117.9 kg 117.3 kg   General exam: Alert, awake, oriented x 3; morbidly obese in appearance; reporting no chest pain, no nausea, no vomiting, no throat troubles or difficulty breathing.  Feeling ready to go home.  Mild thrush appreciated in her tongue. Respiratory system: Good air movement bilaterally, no using accessory muscle.   Good saturation on room air. Cardiovascular system:RRR. No murmurs, rubs, gallops.  Unable to properly assess JVD with body habitus. Gastrointestinal system: Abdomen is nondistended, soft and nontender. No organomegaly or masses felt. Normal bowel sounds heard. Central nervous system: Alert and oriented. No focal neurological deficits. Extremities: No cyanosis or clubbing. Skin: No petechiae. Psychiatry: Judgement and insight appear normal. Mood & affect appropriate.    Condition at discharge: Stable and improved.  The results of significant diagnostics from this hospitalization (including imaging, microbiology, ancillary and laboratory) are listed below for reference.   Imaging Studies: No results found.  Microbiology: Results for orders placed or performed during the hospital encounter of 08/01/21  Resp Panel by RT-PCR (Flu A&B, Covid) Nasopharyngeal Swab     Status: None   Collection Time: 08/01/21  9:06 PM   Specimen: Nasopharyngeal Swab; Nasopharyngeal(NP) swabs in vial transport medium  Result Value Ref Range Status   SARS Coronavirus 2 by RT PCR NEGATIVE NEGATIVE Final    Comment: (NOTE) SARS-CoV-2 target nucleic acids are NOT DETECTED.  The SARS-CoV-2 RNA is generally detectable in upper respiratory specimens during the acute phase of infection. The lowest concentration of SARS-CoV-2 viral copies this assay can detect is 138 copies/mL. A negative result does not preclude SARS-Cov-2 infection and should not be used as the sole basis for treatment or other patient management decisions. A negative result may occur with  improper specimen collection/handling, submission of specimen other than nasopharyngeal swab, presence of viral mutation(s) within the areas targeted by this assay, and inadequate number of viral copies(<138 copies/mL). A negative result must be combined with clinical observations, patient history, and epidemiological information. The expected result is  Negative.  Fact Sheet for Patients:  EntrepreneurPulse.com.au  Fact Sheet for Healthcare Providers:  IncredibleEmployment.be  This test is no t yet approved or cleared by the [place] FDA and  has been authorized for detection and/or diagnosis of SARS-CoV-2 by FDA under an Emergency Use Authorization (EUA). This EUA will remain  in effect (meaning this test can be used) for the duration of the COVID-19 declaration under Section 564(b)(1) of the Act, 21 U.S.C.section 360bbb-3(b)(1), unless the authorization is terminated  or revoked sooner.       Influenza A by PCR NEGATIVE NEGATIVE Final   Influenza B by PCR NEGATIVE NEGATIVE Final    Comment: (NOTE) The Xpert Xpress SARS-CoV-2/FLU/RSV plus assay is intended as an aid in the diagnosis of influenza from Nasopharyngeal swab specimens and should not be used as a sole basis for treatment. Nasal washings and aspirates are unacceptable for Xpert Xpress SARS-CoV-2/FLU/RSV testing.  Fact Sheet for Patients: EntrepreneurPulse.com.au  Fact Sheet for Healthcare Providers: IncredibleEmployment.be  This test is not yet approved or cleared by the [place] FDA and has been authorized for detection and/or diagnosis of SARS-CoV-2  by FDA under an Emergency Use Authorization (EUA). This EUA will remain in effect (meaning this test can be used) for the duration of the COVID-19 declaration under Section 564(b)(1) of the Act, 21 U.S.C. section 360bbb-3(b)(1), unless the authorization is terminated or revoked.  Performed at [facility], [address], [place], [place] [address]     Labs: CBC: Recent Labs  Lab 04/04/22 1432 04/05/22 0433  WBC 9.7 13.4*  NEUTROABS 7.4  --   HGB 12.5 12.4  HCT 38.2 38.0  MCV 82.9 83.9  PLT 262 462   Basic Metabolic Panel: Recent Labs  Lab 04/04/22 1432 04/05/22 0433  NA 135 136  K 3.5 3.7  CL 102 104  CO2 24 23   GLUCOSE 188* 234*  BUN 9 12  CREATININE 0.91 0.70  CALCIUM 8.9 8.5*  MG  --  2.1  PHOS  --  3.8   Liver Function Tests: Recent Labs  Lab 04/05/22 0433  AST 16  ALT 25  ALKPHOS 59  BILITOT 0.2*  PROT 7.4  ALBUMIN 3.7   CBG: Recent Labs  Lab 04/04/22 1719 04/04/22 2114 04/05/22 0113 04/05/22 0728 04/05/22 1058  GLUCAP 202* 254* 205* 212* 137*    Discharge time spent: greater than 30 minutes.  Signed: Barton Dubois, MD [facility] 04/05/2022

## 2022-04-07 ENCOUNTER — Other Ambulatory Visit (HOSPITAL_COMMUNITY): Payer: Medicaid Other

## 2022-04-09 ENCOUNTER — Encounter (HOSPITAL_COMMUNITY): Payer: Self-pay

## 2022-04-09 ENCOUNTER — Ambulatory Visit (HOSPITAL_COMMUNITY): Admit: 2022-04-09 | Payer: Medicaid Other | Admitting: Internal Medicine

## 2022-04-09 SURGERY — COLONOSCOPY WITH PROPOFOL
Anesthesia: Monitor Anesthesia Care

## 2022-04-23 ENCOUNTER — Emergency Department (HOSPITAL_COMMUNITY)
Admission: EM | Admit: 2022-04-23 | Discharge: 2022-04-23 | Discharge status: Against Medical Advice | Payer: Medicaid Other | Attending: Emergency Medicine | Admitting: Emergency Medicine

## 2022-04-23 DIAGNOSIS — K219 Gastro-esophageal reflux disease without esophagitis: Secondary | ICD-10-CM | POA: Insufficient documentation

## 2022-04-23 DIAGNOSIS — Z5321 Procedure and treatment not carried out due to patient leaving prior to being seen by health care provider: Secondary | ICD-10-CM | POA: Diagnosis not present

## 2022-04-23 NOTE — ED Triage Notes (Signed)
Pt to ED via POV, C/O GERD/ACID REFLUX x 3 days. Reports when this occurs she has increased asthma symptoms. Reports using neb treatments at home with relief. NAD in triage. Reports compliant with protonix at home.

## 2022-04-28 ENCOUNTER — Encounter: Payer: Self-pay | Admitting: Gastroenterology

## 2022-04-28 ENCOUNTER — Telehealth (INDEPENDENT_AMBULATORY_CARE_PROVIDER_SITE_OTHER): Payer: Medicaid Other | Admitting: Gastroenterology

## 2022-04-28 VITALS — Ht 64.0 in | Wt 253.0 lb

## 2022-04-28 DIAGNOSIS — Z800 Family history of malignant neoplasm of digestive organs: Secondary | ICD-10-CM | POA: Diagnosis not present

## 2022-04-28 DIAGNOSIS — K760 Fatty (change of) liver, not elsewhere classified: Secondary | ICD-10-CM | POA: Diagnosis not present

## 2022-04-28 DIAGNOSIS — K589 Irritable bowel syndrome without diarrhea: Secondary | ICD-10-CM

## 2022-04-28 DIAGNOSIS — K219 Gastro-esophageal reflux disease without esophagitis: Secondary | ICD-10-CM | POA: Diagnosis not present

## 2022-04-28 NOTE — Progress Notes (Signed)
Primary Care Physician:  Redmond School, MD Primary GI:  Garfield Cornea, MD   Patient Location: Home  Provider Location: [facility] office  Reason for Visit:  Chief Complaint  Patient presents with   Gastroesophageal Reflux    Protonix not working anymore   Persons present on the virtual encounter, with roles: Patient, myself (provider),Tammy Lyon, CMA (updated meds and allergies)  Total time (minutes) spent on medical discussion: 20 minutes  Due to COVID-19, visit was conducted using Mychart video method.  Visit was requested by patient.  Virtual Visit via Mychart video  I connected with LEKISHA MCGHEE on 04/28/22 at  1:00 PM EDT by Mychart video and verified that I am speaking with the correct person using two identifiers.   I discussed the limitations, risks, security and privacy concerns of performing an evaluation and management service by telephone/video and the availability of in person appointments. I also discussed with the patient that there may be a patient responsible charge related to this service. The patient expressed understanding and agreed to proceed.   HPI:   Heather Fry is a 46 y.o. female who presents for virtual visit regarding follow up abdominal pain. Patient last seen in 01/2022 for GERD, IBS, abdominal pain. She has h/o chronic RUQ pain.   Patient was scheduled for a colonoscopy but called and cancelled earlier this month due to having issues regulating her thyroid medication.    Patient seen in the ED at [facility] July 27 with complaints of midsternal chest pain for 3 days associated with flare of asthma.  Chest x-ray was unremarkable.  CTA chest with contrast showed hepatic steatosis but no PE or other acute chest disease.  Labs unremarkable, including CBC, troponin x2, CMET.  Lipase not done.  Patient was admitted overnight, discharged July 8 when she presented with allergic reaction caused by drug.  She was on Keflex and Symbicort due to  respiratory infection, developed oral thrush and was started on nystatin.  2 hours after starting nystatin she complained of dizziness, numbness of the face and tongue, transitory blurry vision.  Concern for reaction to nystatin.   Patient states she used homeopathic regimen with vinegar to treat her oral thrush. Symptoms included coating on the tongue, feeling like cotton in the back of throat, painful swallowing. These symptoms have resolved. However about a week ago she started having severe pain in the chest, felt like a spasm. Radiated into her back. She was concerned it could be heart related so went to ED for evaluation. Also with burning sensation into the chest, radiated into both axilla. Chest felt tight. Used nebulizer for possible flare of asthma. In the ED, GI cocktail gave temporary relief of pain. Belching relieved the chest pressure. Saw her PCP Friday morning, suggested Dexilant, P.A. is pending. They suggested levsin for suspected esophageal spasm, but not covered. States she always has to switch from pantoprazole to omeprazole or vice versa about yearly when they stop working.   Recently started back on omeprazole $RemoveBefor'20mg'[id number]$  BID she had at home. Seems to be helping some. She also complains of early satiety and wonders if its related to her diabetes. States she is interested in getting her colonoscopy done soon now that thyroid is better controlled. BMs are normal. Still feels bloated.   Having a lot of allergy issues, sees allergist soon to start shots.         Prior work up:  Colonoscopy in 2018 unremarkable.  EGD from November 2021 at [facility], gastritis, biopsies negative for H. pylori, fundic gland polyps.   CT pancreatic protocol completed August 2022 with no pancreatic abnormalities.  She had diffuse hepatic steatosis and a focal hyperdense nonenhancing nodularity along the posterior aspect of the right lobe of the liver measuring 11 mm that appears stable at  least since 2019 and consistent with benign/indolent process.  MRI abdomen with and without contrast August 2022 revealed 1 cm subcapsular hemorrhagic cyst in the posterior right hepatic lobe, moderate to severe hepatic steatosis, incompletely visualized cystic lesion in the upper right pelvis measuring 8 cm with inability to exclude ovarian neoplasm.  MRI pelvis with and without contrast completed September 2022 with large cystic lesion associated with the right ovary measuring up to 9.7 cm.  Differential including borderline tumor or large cyst with involution of adjacent smaller cyst.  GYN evaluation recommended.   Patient completed laparoscopic right oophorectomy November 2022.   In February 2023, sed rate 47, CRP 28.  Patient denies any autoimmune diagnosis.  In May 2022 sed rate 39, CRP 25.5.  RA screen negative, anti-CCP less than 5, complement C4 33, normal.  Complement C3 177 normal.  Sjogren's antibody less than 0.2 normal, ANA negative, adolase S 5.3, CK 68 normal. Wbc 8800, hemoglobin 12.9, platelets 281,000, glucose 155, BUN 10, creatinine 0.79, albumin 4.6, total bilirubin less than 0.2, alk phos 73, AST 22, ALT 28.   She has had elevated lipase in the 200 range intermittently dating back as far as 2019.  Highest lipase of 257 in July 2022.  LFTs have been normal aside from slight elevation of AST at 41 in June 2022. History of cholecystectomy.  States that she was given steroids once and it caused pancreatitis.  Current Outpatient Medications  Medication Sig Dispense Refill   acarbose (PRECOSE) 100 MG tablet Take 100 mg by mouth 3 (three) times daily.     acetaminophen (TYLENOL) 500 MG tablet Take 1,000 mg by mouth daily as needed for moderate pain.     albuterol (VENTOLIN HFA) 108 (90 Base) MCG/ACT inhaler Inhale 2 puffs into the lungs every 6 (six) hours as needed. 18 g 11   cetirizine (ZYRTEC) 10 MG tablet Take 10 mg by mouth daily.     Cholecalciferol (VITAMIN D3) 125 MCG (5000  UT) CAPS Take 1 capsule (5,000 Units total) by mouth daily. (Patient taking differently: Take 10,000 Units by mouth daily.) 30 capsule 11   dicyclomine (BENTYL) 10 MG capsule Take 1 capsule (10 mg total) by mouth every 8 (eight) hours as needed for spasms. Take 1 capsule (10 mg total) by mouth up to 3 times daily before meals and at bedtime for abdominal cramping or diarrhea.  Hold in the setting of constipation.     EPINEPHrine 0.3 mg/0.3 mL IJ SOAJ injection Inject 0.3 mg into the muscle as needed for anaphylaxis.     EQUATE STOOL SOFTENER 100 MG capsule Take 100 mg by mouth 2 (two) times daily as needed.     escitalopram (LEXAPRO) 20 MG tablet Take 1 tablet (20 mg total) by mouth at bedtime. 30 tablet 5   fluticasone (FLONASE) 50 MCG/ACT nasal spray Place 2 sprays into both nostrils at bedtime.     glimepiride (AMARYL) 1 MG tablet Take 1 mg by mouth daily.     glucose blood (ACCU-CHEK AVIVA PLUS) test strip 1 each by Other route daily. And lancets 1/day 100 each 3   ipratropium (ATROVENT) 0.02 %  nebulizer solution Take 0.5 mg by nebulization as needed.     Magnesium 250 MG TABS Take 250 mg by mouth at bedtime.      meclizine (ANTIVERT) 25 MG tablet Take 25 mg by mouth 4 (four) times daily as needed.     NON FORMULARY at bedtime. CPAP at night     nystatin cream (MYCOSTATIN) Apply 1 application topically daily as needed (toe fungus).     pantoprazole (PROTONIX) 40 MG tablet Take 1 tablet (40 mg total) by mouth 2 (two) times daily. 180 tablet 3   rosuvastatin (CRESTOR) 10 MG tablet Take 10 mg by mouth once a week.     Simethicone 250 MG CAPS Take 500 mg by mouth at bedtime.     sucralfate (CARAFATE) 1 GM/10ML suspension Take 10 mLs (1 g total) by mouth 4 (four) times daily. As needed 600 mL 1   SYNTHROID 137 MCG tablet Take 137 mcg by mouth daily.     No current facility-administered medications for this visit.    Past Medical History:  Diagnosis Date   Allergic asthma    Anemia    Anxiety     Arthritis    Depression    Diabetes mellitus without complication (HCC)    Diverticulosis    Fatty liver    moderate to severe   GERD (gastroesophageal reflux disease)    Graves disease    Headache    tension, ocular migraines   Hypothyroidism    Internal hemorrhoids    Pneumonia    as an infant   Sleep apnea    Thyroid disease    Uterine fibroid     Past Surgical History:  Procedure Laterality Date   ABDOMINAL HYSTERECTOMY     Per patient, uterine fibroids.   ABDOMINAL HYSTERECTOMY     CARPAL TUNNEL RELEASE Right 06/11/2021   Procedure: Right Carpel tunnel release;  Surgeon: Vallarie Mare, MD;  Location: [place];  Service: Neurosurgery;  Laterality: Right;  RM 18   CHOLECYSTECTOMY N/A 11/06/2014   Procedure: LAPAROSCOPIC CHOLECYSTECTOMY;  Surgeon: Jamesetta So, MD;  Location: [facility];  Service: General;  Laterality: N/A;   COLONOSCOPY N/A 03/04/2017   Dr. Gala Romney: Hemorrhoids, diverticulosis, benign polyp without adenomatous changes.  Next colonoscopy 10 years   DILATION AND CURETTAGE OF UTERUS     ESOPHAGOGASTRODUODENOSCOPY N/A 10/02/2014   Dr. Gala Romney: fundic gland polyps, hiatal hernia   ESOPHAGOGASTRODUODENOSCOPY  07/2020   [facility] ; normal esophagus biopsied, benign-appearing gastric polyps biopsied, gastric biopsies obtained to evaluate for H. pylori, normal duodenum.  Pathology with mild chronic gastritis, negative for H. pylori, fundic gland polyps, esophageal biopsy benign.   MOUTH SURGERY     extraction of teeth   POLYPECTOMY  03/04/2017   Procedure: POLYPECTOMY;  Surgeon: Daneil Dolin, MD;  Location: [facility];  Service: Endoscopy;;  colon   RIGHT OOPHORECTOMY  07/2021   THYROIDECTOMY N/A 09/30/2018   Procedure: TOTAL THYROIDECTOMY;  Surgeon: Armandina Gemma, MD;  Location: [facility];  Service: General;  Laterality: N/A;   TUBAL LIGATION      Family History  Problem Relation Age of Onset   Uterine cancer Mother        ?cervical cancer    Colon cancer Father 65   Hypertension Father    Diabetes Father    Colon polyps Father        age less than 13   Uterine cancer Sister  suicide   Colon cancer Paternal Aunt 82       also had skin cancer, ovarian?   Depression Other    Thyroid disease Neg Hx    Pancreatic cancer Neg Hx     Social History   Socioeconomic History   Marital status: Married    Spouse name: Not on file   Number of children: Not on file   Years of education: Not on file   Highest education level: Not on file  Occupational History   Not on file  Tobacco Use   Smoking status: Former    Packs/day: 1.00    Years: 5.00    Total pack years: 5.00    Types: Cigarettes    Quit date: 11/03/1997    Years since quitting: 24.4   Smokeless tobacco: Never   Tobacco comments:    1 pack 1 week  Vaping Use   Vaping Use: Never used  Substance and Sexual Activity   Alcohol use: No    Alcohol/week: 0.0 standard drinks of alcohol   Drug use: No   Sexual activity: Yes    Birth control/protection: Surgical  Other Topics Concern   Not on file  Social History Narrative   Not on file   Social Determinants of Health   Financial Resource Strain: Not on file  Food Insecurity: Not on file  Transportation Needs: Not on file  Physical Activity: Not on file  Stress: Not on file  Social Connections: Not on file  Intimate Partner Violence: Not on file      ROS:  General: Negative for anorexia, weight loss, fever, chills, fatigue, weakness. Eyes: Negative for vision changes.  ENT: Negative for hoarseness, difficulty swallowing , nasal congestion. CV: Negative for chest pain, angina, palpitations, dyspnea on exertion, peripheral edema.  Respiratory: Negative for dyspnea at rest, dyspnea on exertion, cough, sputum, wheezing. See hpi  GI: See history of present illness. GU:  Negative for dysuria, hematuria, urinary incontinence, urinary frequency, nocturnal urination.  MS: Negative for joint pain, low back  pain.  Derm: Negative for rash or itching.  Neuro: Negative for weakness, abnormal sensation, seizure, frequent headaches, memory loss, confusion.  Psych: Negative for anxiety, depression, suicidal ideation, hallucinations.  Endo: Negative for unusual weight change.  Heme: Negative for bruising or bleeding. Allergy: Negative for rash or hives.   Observations/Objective: Pleasant well nourished well developed female in NAD. No SOB. A+O X 3. Obese.     Lab Results  Component Value Date   CREATININE 0.70 04/05/2022   BUN 12 04/05/2022   NA 136 04/05/2022   K 3.7 04/05/2022   CL 104 04/05/2022   CO2 23 04/05/2022   Lab Results  Component Value Date   LIPASE 53 03/05/2022   Lab Results  Component Value Date   WBC 13.4 (H) 04/05/2022   HGB 12.4 04/05/2022   HCT 38.0 04/05/2022   MCV 83.9 04/05/2022   PLT 274 04/05/2022   Lab Results  Component Value Date   ALT 25 04/05/2022   AST 16 04/05/2022   ALKPHOS 59 04/05/2022   BILITOT 0.2 (L) 04/05/2022   Lab Results  Component Value Date   HGBA1C 7.3 (H) 04/04/2022    Assessment and Plan:  GERD: EGD 07/2020 at [facility] with gastritis. Suspect chest discomfort due to flare of GERD. Describes recent oral candidiasis which was treated with homeopathic regimen due to allergic reaction to nystatin and concern of potential drug interactions between Lexapro and Diflucan.  She describes oral candidiasis  is resolving.  No longer having new symptoms.  Discussed potential symptoms of esophageal candidiasis but she denies at this time.  She could have had esophageal spasm prompted by flare of her GERD to explain her pain.  Agree with switching PPI therapy.  Consider short-term trial for esophageal spasms as needed.  If symptoms do not settle down, she may need upper endoscopy.  IBS: Doing well at this time.    Family history of colon cancer: Patient is due for high risk screening colonoscopy, we will reschedule in the near future wants it is  determined whether or not she will also need an upper endoscopy.  Fatty liver: Most recent LFTs normal.  Encouraged healthy diet, exercise, gradual weight loss.  PCP recommended Dexilant 60 mg daily, prior authorization pending.  She will keep me posted whether they have issues getting this covered. Trial of dicyclomine 20 mg as needed esophageal spasm, not more than 4 times per day. She can also try Altoids, dissolve two on tongue before meals as needed for esophageal spasms.  Colonoscopy at later date, as outlined.   Follow Up Instructions:    I discussed the assessment and treatment plan with the patient. The patient was provided an opportunity to ask questions and all were answered. The patient agreed with the plan and demonstrated an understanding of the instructions. AVS mailed to patient's home address.   The patient was advised to call back or seek an in-person evaluation if the symptoms worsen or if the condition fails to improve as anticipated.  I provided 20 minutes of virtual face-to-face time during this encounter.   Neil Crouch, PA-C

## 2022-04-28 NOTE — Patient Instructions (Signed)
Hopefully you will be able to switch to Dexilant '60mg'$  daily before breakfast. Until then continue omeprazole '20mg'$  twice daily before a meal. For possible esophageal spasms, take dicyclomine '20mg'$  before meals and at bedtime as needed (up to four times daily). For esophageal spasms, you can also use Altoid mints, dissolve two on tongue before meals as needed.  Let me know how you are doing in a couple of weeks. If better, we will proceed with colonoscopy. If not, then we would consider upper endoscopy at time of your colonoscopy.

## 2022-05-05 ENCOUNTER — Other Ambulatory Visit: Payer: Self-pay | Admitting: Gastroenterology

## 2022-05-05 NOTE — Telephone Encounter (Signed)
It is not clear to me that patient is still taking Carafate. Can we find out about this?

## 2022-05-06 NOTE — Telephone Encounter (Signed)
Noted. Will refill.

## 2022-05-07 ENCOUNTER — Encounter (INDEPENDENT_AMBULATORY_CARE_PROVIDER_SITE_OTHER): Payer: Self-pay

## 2022-05-08 NOTE — Progress Notes (Signed)
Primary Care Physician:  Redmond School, MD  Primary GI: Dr. Gala Romney  Patient Location: Home   Provider Location: [facility] office   Reason for Visit: Upper abdominal pain   Persons present on the virtual encounter, with roles: Aliene Altes, PA-C (Provider), Heather Fry (patient)   Total time (minutes) spent on medical discussion: 20 minutes  Virtual Visit via video note Due to COVID-19, visit is conducted virtually and was requested by patient.   I connected with [name] on 05/09/22 at 10:00 AM EDT by video and verified that I am speaking with the correct person using two identifiers.   I discussed the limitations, risks, security and privacy concerns of performing an evaluation and management service by video and the availability of in person appointments. I also discussed with the patient that there may be a patient responsible charge related to this service. The patient expressed understanding and agreed to proceed.  Chief Complaint  Patient presents with   Abdominal Pain    Nauseated for about a week       History of Present Illness: Heather Fry is a 46 year old female with GI history of GERD, IBS, chronic intermittent episodes of upper abdominal burning radiating into her chest with associated nausea and diarrhea.  History of cholecystectomy.  Evaluated in the emergency room several times for these intermittent episodes.  Labs unrevealing aside from intermittently elevated lipase. Prior CT imaging unrevealing including CT with pancreatic protocol in August 2022.  She does have fatty liver and was found to have a 1 cm subcapsular hemorrhagic cyst in the posterior right hepatic lobe and a large cyst lesion associated with the right ovary on MRI in 2022, s/p laparoscopic right oophorectomy.  Colonoscopy in 2018 unremarkable, due for screening due to Fhx.  Most recent EGD on file November 2021 at [facility] with gastritis, biopsies negative for H. pylori, fundic  gland polyps.   Last seen via virtual visit 04/28/22.  She had been seen in the ED at [facility] July 27 with complaints of midsternal chest pain for 3 days associated with flare of asthma.  Chest x-ray unremarkable.  CTA chest with contrast showed hepatic steatosis, no PE or other acute process.  Labs unremarkable.  She had also previously been admitted overnight, discharged on July 8 for possible allergic reaction to nystatin for oral thrush.  She reported using a homeopathic regimen to clear her oral thrush and stated it had resolved.  At the time of her visit, she reported about a week prior, she started having severe pain in her chest that felt like a spasm radiating to her back, burning sensation in her chest radiating to her axilla.  Evaluated in the ED with GI cocktail giving temporary relief.  Saw PCP who recommended Dexilant with PA pending.  Also suggested Levsin for possible esophageal spasm, but this was not covered.  Reported she had to switch back and forth between pantoprazole and omeprazole about yearly when they stopped working.  She recently started back on omeprazole 20 mg twice daily and this seemed to be helping some.  Complained of some early satiety.  Bowels moving well, but still felt bloated.  Agreed with patient starting Dexilant, trial of dicyclomine 20 mg as needed for esophageal spasms, can also try altoids before meals, would schedule colonoscopy at a later date as she wanted to wait to see if she may need an upper endoscopy in the near future.  Today: She reports on Monday, she developed  a sharp, heavy pain in the left upper flank that was moved across her upper abdomen, or to her back and felt like a spasm.  She took dicyclomine and this helped, then she had a bowel movement which was, "normal" but slow.  This episode started after eating dinner when she had chicken tenders and mac & cheese.  She had symptoms for 3 days in a row, typically after eating dinner.  States the  pain usually started on the left, but may radiate to various spots in her abdomen or back.  She thought it may be gas pain.  Only with a little nausea initially, but nothing significant and no vomiting.  Sort of feels like it has when she has been constipated/backed up in the past.  Yesterday and today, she is feeling somewhat improved.  She reports her bowels have not been moving very well.  She does tend to have a bowel movement most days, but they have been incomplete.  States she had to disimpact herself last week.  She only takes dicyclomine as needed for abdominal cramping due to IBS, but nothing routine as she knows this will worsen constipation. Doesn't take anything on a regular basis for constipation.  Takes Colace as needed. Has tried MiraLAX and felt it did not work well.  Has never been on a prescriptive agent.  Denies urinary symptoms.  She also tells me she had a head cold earlier this week and has been coughing a lot and thought she may have pulled a muscle as well.  Taking altoids and no longer having esophageal spasm.   Insurance denied Dexilant.  Taking carafate and omeprazole 20 mg twice daily.  Has reflux 1 or 2 times a week.     Prior work up:   Colonoscopy in 2018 unremarkable.     EGD from November 2021 at [facility], gastritis, biopsies negative for H. pylori, fundic gland polyps.   CT pancreatic protocol completed August 2022 with no pancreatic abnormalities.  She had diffuse hepatic steatosis and a focal hyperdense nonenhancing nodularity along the posterior aspect of the right lobe of the liver measuring 11 mm that appears stable at least since 2019 and consistent with benign/indolent process.  MRI abdomen with and without contrast August 2022 revealed 1 cm subcapsular hemorrhagic cyst in the posterior right hepatic lobe, moderate to severe hepatic steatosis, incompletely visualized cystic lesion in the upper right pelvis measuring 8 cm with inability  to exclude ovarian neoplasm.  MRI pelvis with and without contrast completed September 2022 with large cystic lesion associated with the right ovary measuring up to 9.7 cm.  Differential including borderline tumor or large cyst with involution of adjacent smaller cyst.  GYN evaluation recommended.   Patient completed laparoscopic right oophorectomy November 2022.   In February 2023, sed rate 47, CRP 28.  Patient denies any autoimmune diagnosis.  In May 2022 sed rate 39, CRP 25.5.  RA screen negative, anti-CCP less than 5, complement C4 33, normal.  Complement C3 177 normal.  Sjogren's antibody less than 0.2 normal, ANA negative, adolase S 5.3, CK 68 normal. Wbc 8800, hemoglobin 12.9, platelets 281,000, glucose 155, BUN 10, creatinine 0.79, albumin 4.6, total bilirubin less than 0.2, alk phos 73, AST 22, ALT 28.   She has had elevated lipase in the 200 range intermittently dating back as far as 2019.  Highest lipase of 257 in July 2022.  LFTs have been normal aside from slight elevation of AST at 41 in  June 2022. History of cholecystectomy.  States that she was given steroids once and it caused pancreatitis.  Past Medical History:  Diagnosis Date   Allergic asthma    Anemia    Anxiety    Arthritis    Depression    Diabetes mellitus without complication (HCC)    Diverticulosis    Fatty liver    moderate to severe   GERD (gastroesophageal reflux disease)    Graves disease    Headache    tension, ocular migraines   Hypothyroidism    Internal hemorrhoids    Pneumonia    as an infant   Sleep apnea    Thyroid disease    Uterine fibroid      Past Surgical History:  Procedure Laterality Date   ABDOMINAL HYSTERECTOMY     Per patient, uterine fibroids.   ABDOMINAL HYSTERECTOMY     CARPAL TUNNEL RELEASE Right 06/11/2021   Procedure: Right Carpel tunnel release;  Surgeon: Vallarie Mare, MD;  Location: [place];  Service: Neurosurgery;  Laterality: Right;  RM 18   CHOLECYSTECTOMY N/A  11/06/2014   Procedure: LAPAROSCOPIC CHOLECYSTECTOMY;  Surgeon: Jamesetta So, MD;  Location: [facility];  Service: General;  Laterality: N/A;   COLONOSCOPY N/A 03/04/2017   Dr. Gala Romney: Hemorrhoids, diverticulosis, benign polyp without adenomatous changes.  Next colonoscopy 10 years   DILATION AND CURETTAGE OF UTERUS     ESOPHAGOGASTRODUODENOSCOPY N/A 10/02/2014   Dr. Gala Romney: fundic gland polyps, hiatal hernia   ESOPHAGOGASTRODUODENOSCOPY  07/2020   [facility] ; normal esophagus biopsied, benign-appearing gastric polyps biopsied, gastric biopsies obtained to evaluate for H. pylori, normal duodenum.  Pathology with mild chronic gastritis, negative for H. pylori, fundic gland polyps, esophageal biopsy benign.   MOUTH SURGERY     extraction of teeth   POLYPECTOMY  03/04/2017   Procedure: POLYPECTOMY;  Surgeon: Daneil Dolin, MD;  Location: [facility];  Service: Endoscopy;;  colon   RIGHT OOPHORECTOMY  07/2021   THYROIDECTOMY N/A 09/30/2018   Procedure: TOTAL THYROIDECTOMY;  Surgeon: Armandina Gemma, MD;  Location: [facility];  Service: General;  Laterality: N/A;   TUBAL LIGATION       Current Meds  Medication Sig   acarbose (PRECOSE) 100 MG tablet Take 100 mg by mouth 3 (three) times daily.   acetaminophen (TYLENOL) 500 MG tablet Take 1,000 mg by mouth daily as needed for moderate pain.   albuterol (VENTOLIN HFA) 108 (90 Base) MCG/ACT inhaler Inhale 2 puffs into the lungs every 6 (six) hours as needed.   cetirizine (ZYRTEC) 10 MG tablet Take 10 mg by mouth daily.   Cholecalciferol (VITAMIN D3) 125 MCG (5000 UT) CAPS Take 1 capsule (5,000 Units total) by mouth daily. (Patient taking differently: Take 10,000 Units by mouth daily.)   dicyclomine (BENTYL) 10 MG capsule Take 1 capsule (10 mg total) by mouth every 8 (eight) hours as needed for spasms. Take 1 capsule (10 mg total) by mouth up to 3 times daily before meals and at bedtime for abdominal cramping or diarrhea.  Hold in the setting of  constipation.   EPINEPHrine 0.3 mg/0.3 mL IJ SOAJ injection Inject 0.3 mg into the muscle as needed for anaphylaxis.   EQUATE STOOL SOFTENER 100 MG capsule Take 100 mg by mouth 2 (two) times daily as needed.   escitalopram (LEXAPRO) 20 MG tablet Take 1 tablet (20 mg total) by mouth at bedtime.   fluticasone (FLONASE) 50 MCG/ACT nasal spray Place 2 sprays into both nostrils  at bedtime.   glimepiride (AMARYL) 1 MG tablet Take 1 mg by mouth daily.   glucose blood (ACCU-CHEK AVIVA PLUS) test strip 1 each by Other route daily. And lancets 1/day   ipratropium (ATROVENT) 0.02 % nebulizer solution Take 0.5 mg by nebulization as needed.   Magnesium 250 MG TABS Take 250 mg by mouth at bedtime.    meclizine (ANTIVERT) 25 MG tablet Take 25 mg by mouth 4 (four) times daily as needed.   NON FORMULARY at bedtime. CPAP at [facility]   omeprazole (PRILOSEC) 40 MG capsule Take 1 capsule (40 mg total) by mouth 2 (two) times daily before a meal.   rosuvastatin (CRESTOR) 10 MG tablet Take 10 mg by mouth once a week.   Simethicone 250 MG CAPS Take 500 mg by mouth at bedtime.   sucralfate (CARAFATE) 1 GM/10ML suspension TAKE 10 ML BY MOUTH  4 TIMES DAILY AS NEEDED   SYNTHROID 137 MCG tablet Take 137 mcg by mouth daily.   [DISCONTINUED] omeprazole (PRILOSEC) 20 MG capsule Take 20 mg by mouth 2 (two) times daily before a meal.     Family History  Problem Relation Age of Onset   Uterine cancer Mother        ?cervical cancer   Colon cancer Father 72   Hypertension Father    Diabetes Father    Colon polyps Father        age less than 22   Uterine cancer Sister        suicide   Colon cancer Paternal Aunt 37       also had skin cancer, ovarian?   Depression Other    Thyroid disease Neg Hx    Pancreatic cancer Neg Hx     Social History   Socioeconomic History   Marital status: Married    Spouse name: Not on file   Number of children: Not on file   Years of education: Not on file   Highest education level:  Not on file  Occupational History   Not on file  Tobacco Use   Smoking status: Former    Packs/day: 1.00    Years: 5.00    Total pack years: 5.00    Types: Cigarettes    Quit date: 11/03/1997    Years since quitting: 24.5   Smokeless tobacco: Never   Tobacco comments:    1 pack 1 week  Vaping Use   Vaping Use: Never used  Substance and Sexual Activity   Alcohol use: No    Alcohol/week: 0.0 standard drinks of alcohol   Drug use: No   Sexual activity: Yes    Birth control/protection: Surgical  Other Topics Concern   Not on file  Social History Narrative   Not on file   Social Determinants of Health   Financial Resource Strain: Not on file  Food Insecurity: Not on file  Transportation Needs: Not on file  Physical Activity: Not on file  Stress: Not on file  Social Connections: Not on file       Review of Systems: Gen: Denies fever, chills, cold or flu like symptoms, pre-syncope, or syncope.  CV: Denies chest pain, palpitations. Resp: Denies dyspnea, cough.  GI: see HPI Heme: See HPI  Observations/Objective: No distress. Alert and oriented. Pleasant. Well nourished. Normal mood and affect. Unable to perform complete physical exam due to video encounter.    Assessment:  46 year old female with GI history of GERD, gastritis, IBS, chronic intermittent upper abdominal pain, prior cholecystectomy, chronic  intermittent lipase elevation with follow-up CT including CT with pancreatic protocol unrevealing, fatty liver, presenting today with chief complaint of abdominal pain, also with constipation and GERD.  Generalized abdominal pain/constipation:  Chronic history of intermittent abdominal pain likely related to IBS, using dicyclomine as needed.  Patient reports an episode of postprandial abdominal pain starting in the left upper abdomen/flank and radiating across her abdomen or to her back x 3 days this week with associated mild nausea without vomiting and improved somewhat by  dicyclomine or having a bowel movement.  No alarm symptoms, and she has been feeling improved for the last 2 days.  Query whether symptoms are related to IBS with constipation at this time as she is having incomplete bowel movements and reports having to disimpact herself last week.  She has never been on a prescriptive agent for constipation and is only taking Colace as needed which she reports does help.  I have recommended starting Colace daily, Benefiber daily, and monitoring symptoms.  Patient is also asking if we can update blood work and make sure her pancreas is not inflamed.  GERD: Chronic.  Improved, but not adequately managed after switching from pantoprazole 40 mg twice daily to omeprazole 20 mg twice daily.  PCP had previously prescribed Dexilant, but insurance has denied this.  Patient states they need documentation that this medication is absolutely needed.  For now, we will maximize use of omeprazole by increasing to 40 mg twice daily.  If she fails this, we can resubmit for Dexilant.  Esophageal spasm: Previously suspected to have esophageal spasms and was advised to use Altoid's to help with this.  Patient reports her symptoms have resolved.  Family history of colon cancer: Due for high risk screening colonoscopy due to father with history of colon polyps and colon cancer.  We will need to schedule this in the near future.  Holding off on this for now per patient's request as we are monitoring for improvement in her abdominal pain with supportive measures to determine if any additional workup will be needed.    Plan: CBC, CMP, lipase Start Colace 100 mg daily.  May increase up to 3 times daily if needed. Start Benefiber 2 teaspoons daily x 2 weeks, then increase to twice daily. Increase omeprazole to 40 mg twice daily 30 minutes before breakfast and dinner.  New prescription sent to pharmacy. We will hold off on scheduling colonoscopy for now while we monitor for improvement of  abdominal pain.  Requested patient to call back with progress report in 2 weeks or sooner if worsening symptoms.     I discussed the assessment and treatment plan with the patient. The patient was provided an opportunity to ask questions and all were answered. The patient agreed with the plan and demonstrated an understanding of the instructions.   The patient was advised to call back or seek an in-person evaluation if the symptoms worsen or if the condition fails to improve as anticipated.  I provided 20 minutes of video-face-to-face time during this encounter.  Aliene Altes, PA-C [facility]  05/09/2022

## 2022-05-09 ENCOUNTER — Telehealth: Payer: Self-pay | Admitting: Gastroenterology

## 2022-05-09 ENCOUNTER — Telehealth: Payer: Self-pay | Admitting: *Deleted

## 2022-05-09 ENCOUNTER — Encounter: Payer: Self-pay | Admitting: Gastroenterology

## 2022-05-09 ENCOUNTER — Telehealth (INDEPENDENT_AMBULATORY_CARE_PROVIDER_SITE_OTHER): Payer: Medicaid Other | Admitting: Gastroenterology

## 2022-05-09 VITALS — Ht 64.0 in | Wt 253.0 lb

## 2022-05-09 DIAGNOSIS — K219 Gastro-esophageal reflux disease without esophagitis: Secondary | ICD-10-CM

## 2022-05-09 DIAGNOSIS — R1084 Generalized abdominal pain: Secondary | ICD-10-CM | POA: Insufficient documentation

## 2022-05-09 DIAGNOSIS — K5900 Constipation, unspecified: Secondary | ICD-10-CM | POA: Diagnosis not present

## 2022-05-09 MED ORDER — OMEPRAZOLE 40 MG PO CPDR: 40.0000 mg | DELAYED_RELEASE_CAPSULE | Freq: Two times a day (BID) | ORAL | 3 refills | Status: DC

## 2022-05-09 NOTE — Patient Instructions (Signed)
Please have blood work completed at [facility].  To help with bowel regularity/constipation: Start Colace 100 mg daily.  You may increase up to 3 times a day if needed. Start Benefiber 2 teaspoons daily x 2 weeks, then increase to twice daily.  For acid reflux: Increase omeprazole to 40 mg twice daily 30 minutes before breakfast and dinner.  I have sent an updated prescription to your pharmacy.  We will hold off on scheduling your colonoscopy for now as you requested and monitor your symptoms a little longer to determine if you may also need an upper endoscopy.   Please call us back in a couple of weeks to let us know how you are doing or sooner if any worsening symptoms.   Aliene Altes, PA-C [facility]

## 2022-05-09 NOTE — Telephone Encounter (Signed)
Labs mailed

## 2022-05-09 NOTE — Telephone Encounter (Signed)
Heather Fry, please mail lab orders to patient that were placed at today's virtual visit.

## 2022-05-09 NOTE — Telephone Encounter (Signed)
[name], you are scheduled for a virtual visit with your provider today.  Just as we do with appointments in the office, we must obtain your consent to participate.  Your consent will be active for this visit and any virtual visit you may have with one of our providers in the next 365 days.  If you have a MyChart account, I can also send a copy of this consent to you electronically.  All virtual visits are billed to your insurance company just like a traditional visit in the office.  As this is a virtual visit, video technology does not allow for your provider to perform a traditional examination.  This may limit your provider's ability to fully assess your condition.  If your provider identifies any concerns that need to be evaluated in person or the need to arrange testing such as labs, EKG, etc, we will make arrangements to do so.  Although advances in technology are sophisticated, we cannot ensure that it will always work on either your end or our end.  If the connection with a video visit is poor, we may have to switch to a telephone visit.  With either a video or telephone visit, we are not always able to ensure that we have a secure connection.   I need to obtain your verbal consent now.   Are you willing to proceed with your visit today?  Patient consent to virtual visit.

## 2022-06-06 ENCOUNTER — Ambulatory Visit (INDEPENDENT_AMBULATORY_CARE_PROVIDER_SITE_OTHER): Payer: Medicaid Other | Admitting: Allergy & Immunology

## 2022-06-06 ENCOUNTER — Encounter: Payer: Self-pay | Admitting: Allergy & Immunology

## 2022-06-06 VITALS — BP 138/80 | HR 76 | Temp 98.7°F | Resp 16 | Ht 64.0 in | Wt 258.8 lb

## 2022-06-06 DIAGNOSIS — Z889 Allergy status to unspecified drugs, medicaments and biological substances status: Secondary | ICD-10-CM | POA: Diagnosis not present

## 2022-06-06 DIAGNOSIS — J3089 Other allergic rhinitis: Secondary | ICD-10-CM

## 2022-06-06 DIAGNOSIS — J4540 Moderate persistent asthma, uncomplicated: Secondary | ICD-10-CM | POA: Diagnosis not present

## 2022-06-06 DIAGNOSIS — J302 Other seasonal allergic rhinitis: Secondary | ICD-10-CM

## 2022-06-06 DIAGNOSIS — J310 Chronic rhinitis: Secondary | ICD-10-CM

## 2022-06-06 MED ORDER — SPIRIVA RESPIMAT 1.25 MCG/ACT IN AERS: 2.0000 | INHALATION_SPRAY | Freq: Every day | RESPIRATORY_TRACT | 5 refills | Status: DC

## 2022-06-06 MED ORDER — AZELASTINE HCL 0.1 % NA SOLN: NASAL | 5 refills | Status: DC

## 2022-06-06 MED ORDER — LEVALBUTEROL HCL 0.63 MG/3ML IN NEBU: 0.6300 mg | INHALATION_SOLUTION | RESPIRATORY_TRACT | 1 refills | Status: DC | PRN

## 2022-06-06 NOTE — Progress Notes (Unsigned)
NEW PATIENT  Date of Service/Encounter:  06/07/22  Consult requested by: Redmond School, MD   Assessment:   Moderate persistent asthma, uncomplicated - Plan: Spirometry with Graph, Interdermal Allergy Test, Alpha-1-antitrypsin, ANCA TITERS, Aspergillus Precipitins, CBC with Differential/Platelet, IgE  Chronic rhinitis - Plan: Allergy Test  Plan/Recommendations:    Patient Instructions  1. Moderate persistent asthma, uncomplicated - Lung testing looks decent today.  - We are going to add on Spiriva two puffs once daily.  - I would stop your Atrovent (ipratropium) four times daily (since this is in the same class as the Spiriva). - We are going to get some labs to rule out difficult to control causes of breathing issues.  - Daily controller medication(s): Spiriva 1.71mcg two puffs once daily - Rescue medications: Atrovent (ipratropium) one treatment daily as needed OR Xopenex nebulizer treatment every 6 hours as needed. - Asthma control goals:  * Full participation in all desired activities (may need albuterol before activity) * Albuterol use two time or less a week on average (not counting use with activity) * Cough interfering with sleep two time or less a month * Oral steroids no more than once a year * No hospitalizations  2. Perennial and seasonal allergic rhinitis - Testing today showed: indoor molds, outdoor molds, and dust mites. - Copy of test results provided.  - Avoidance measures provided. - Stop taking:  - Continue with: Flonase and Zyrtec - Start taking: Astelin (azelastine) 2 sprays per nostril 1-2 times daily as needed - You can use an extra dose of the antihistamine, if needed, for breakthrough symptoms.  - Consider nasal saline rinses 1-2 times daily to remove allergens from the nasal cavities as well as help with mucous clearance (this is especially helpful to do before the nasal sprays are given) - Consider allergy shots as a means of long-term  control. - Allergy shots "re-train" and "reset" the immune system to ignore environmental allergens and decrease the resulting immune response to those allergens (sneezing, itchy watery eyes, runny nose, nasal congestion, etc).    - Allergy shots improve symptoms in 75-85% of patients.  - We would be able to do one vial for your allergy shots.   3. Return in about 8 weeks (around 08/01/2022).    Please inform us of any Emergency Department visits, hospitalizations, or changes in symptoms. Call us before going to the ED for breathing or allergy symptoms since we might be able to fit you in for a sick visit. Feel free to contact us anytime with any questions, problems, or concerns.  It was a pleasure to meet you today!  Websites that have reliable patient information: 1. American Academy of Asthma, Allergy, and Immunology: www.aaaai.org 2. Food Allergy Research and Education (FARE): foodallergy.org 3. Mothers of Asthmatics: http://www.asthmacommunitynetwork.org 4. American College of Allergy, Asthma, and Immunology: www.acaai.org   COVID-19 Vaccine Information can be found at: [email or web address].uk For questions related to vaccine distribution or appointments, please email [email or web address] or call [phone].   We realize that you might be concerned about having an allergic reaction to the COVID19 vaccines. To help with that concern, WE ARE OFFERING THE COVID19 VACCINES IN OUR OFFICE! Ask the front desk for dates!     "Like" [place] on Facebook and Instagram for our latest updates!      A healthy democracy works best when [employer] participate! Make sure you are registered to vote! If you have moved or changed any of your contact information, you will need to get  this updated before voting!  In some cases, you MAY be able to register to vote online: [email or web address]       Airborne Adult Perc  - 06/06/22 1452     Time Antigen Placed 1441    Allergen Manufacturer Lavella Hammock    Location Back    Number of Test 59    1. Control-Buffer 50% Glycerol Negative    2. Control-Histamine 1 mg/ml 2+    3. Albumin saline Negative    4. [place] 2+    5. [place] Negative    6. Johnson Negative    7. [place] Blue Negative    8. Meadow Fescue Negative    9. Perennial Rye Negative    10. Sweet Vernal Negative    11. Timothy Negative    12. Cocklebur Negative    13. Burweed Marshelder Negative    14. Ragweed, short Negative    15. Ragweed, Giant Negative    16. Plantain,  English Negative    17. Lamb's Quarters Negative    18. Sheep Sorrell Negative    19. Rough Pigweed Negative    20. Marsh Elder, Rough Negative    21. Mugwort, Common Negative    22. Ash mix Negative    23. Birch mix Negative    24. Beech American Negative    25. Box, Elder Negative    26. Cedar, red Negative    27. Cottonwood, [place] Negative    28. Elm mix Negative    29. Hickory Negative    30. Maple mix Negative    31. Oak, [place] mix Negative    32. Pecan Pollen Negative    33. Pine mix Negative    34. Sycamore Eastern Negative    35. [place], Black Pollen Negative    36. Alternaria alternata Negative    37. Cladosporium Herbarum Negative    38. Aspergillus mix Negative    39. Penicillium mix Negative    40. Bipolaris sorokiniana (Helminthosporium) Negative    41. Drechslera spicifera (Curvularia) Negative    42. Mucor plumbeus Negative    43. Fusarium moniliforme Negative    44. Aureobasidium pullulans (pullulara) Negative    45. Rhizopus oryzae Negative    46. Botrytis cinera Negative    47. Epicoccum nigrum Negative    48. Phoma betae Negative    49. Candida Albicans Negative    50. Trichophyton mentagrophytes Negative    51. Mite, D Farinae  5,000 AU/ml 3+    52. Mite, D Pteronyssinus  5,000 AU/ml 3+    53. Cat Hair 10,000 BAU/ml Negative    54.  Dog Epithelia Negative    55. Mixed Feathers  Negative    56. Horse Epithelia Negative    57. Cockroach, German Negative    58. Mouse Negative    59. Tobacco Leaf Negative             Intradermal - 06/06/22 1504     Time Antigen Placed 1505    Allergen Manufacturer Lavella Hammock    Location Arm    Number of Test 14    Intradermal Select    Control Negative    [place] Negative    Johnson Negative    7 Grass Negative    Ragweed mix Negative    Weed mix Negative    Tree mix Negative    Mold 1 Negative    Mold 2 2+    Mold 3 4+    Mold 4 3+    Cat Negative  Dog Negative    Cockroach Negative             Control of Dust Mite Allergen    Dust mites play a major role in allergic asthma and rhinitis.  They occur in environments with high humidity wherever human skin is found.  Dust mites absorb humidity from the atmosphere (ie, they do not drink) and feed on organic matter (including shed human and animal skin).  Dust mites are a microscopic type of insect that you cannot see with the naked eye.  High levels of dust mites have been detected from mattresses, pillows, carpets, upholstered furniture, bed covers, clothes, soft toys and any woven material.  The principal allergen of the dust mite is found in its feces.  A gram of dust may contain 1,000 mites and 250,000 fecal particles.  Mite antigen is easily measured in the air during house cleaning activities.  Dust mites do not bite and do not cause harm to humans, other than by triggering allergies/asthma.    Ways to decrease your exposure to dust mites in your home:  Encase mattresses, box springs and pillows with a mite-impermeable barrier or cover   Wash sheets, blankets and drapes weekly in hot water (130 F) with detergent and dry them in a dryer on the hot setting.  Have the room cleaned frequently with a vacuum cleaner and a damp dust-mop.  For carpeting or rugs, vacuuming with a vacuum cleaner equipped with a high-efficiency particulate air (HEPA) filter.  The dust mite  allergic individual should not be in a room which is being cleaned and should wait 1 hour after cleaning before going into the room. Do not sleep on upholstered furniture (eg, couches).   If possible removing carpeting, upholstered furniture and drapery from the home is ideal.  Horizontal blinds should be eliminated in the rooms where the person spends the most time (bedroom, study, television room).  Washable vinyl, roller-type shades are optimal. Remove all non-washable stuffed toys from the bedroom.  Wash stuffed toys weekly like sheets and blankets above.   Reduce indoor humidity to less than 50%.  Inexpensive humidity monitors can be purchased at most hardware stores.  Do not use a humidifier as can make the problem worse and are not recommended.   Control of Mold Allergen   Mold and fungi can grow on a variety of surfaces provided certain temperature and moisture conditions exist.  Outdoor molds grow on plants, decaying vegetation and soil.  The major outdoor mold, Alternaria and Cladosporium, are found in very high numbers during hot and dry conditions.  Generally, a late Summer - Fall peak is seen for common outdoor fungal spores.  Rain will temporarily lower outdoor mold spore count, but counts rise rapidly when the rainy period ends.  The most important indoor molds are Aspergillus and Penicillium.  Dark, humid and poorly ventilated basements are ideal sites for mold growth.  The next most common sites of mold growth are the bathroom and the kitchen.  Outdoor (Seasonal) Mold Control  Positive outdoor molds via skin testing: Bipolaris (Helminthsporium), Drechslera (Curvalaria), and Mucor  Use air conditioning and keep windows closed Avoid exposure to decaying vegetation. Avoid leaf raking. Avoid grain handling. Consider wearing a face mask if working in moldy areas.    Indoor (Perennial) Mold Control   Positive indoor molds via skin testing: Aspergillus, Penicillium, Fusarium,  Aureobasidium (Pullulara), and Rhizopus  Maintain humidity below 50%. Clean washable surfaces with 5% bleach solution. Remove sources e.g.  contaminated carpets.    Allergy Shots   Allergies are the result of a chain reaction that starts in the immune system. Your immune system controls how your body defends itself. For instance, if you have an allergy to pollen, your immune system identifies pollen as an invader or allergen. Your immune system overreacts by producing antibodies called Immunoglobulin E (IgE). These antibodies travel to cells that release chemicals, causing an allergic reaction.  The concept behind allergy immunotherapy, whether it is received in the form of shots or tablets, is that the immune system can be desensitized to specific allergens that trigger allergy symptoms. Although it requires time and patience, the payback can be long-term relief.  How Do Allergy Shots Work?  Allergy shots work much like a vaccine. Your body responds to injected amounts of a particular allergen given in increasing doses, eventually developing a resistance and tolerance to it. Allergy shots can lead to decreased, minimal or no allergy symptoms.  There generally are two phases: build-up and maintenance. Build-up often ranges from three to six months and involves receiving injections with increasing amounts of the allergens. The shots are typically given once or twice a week, though more rapid build-up schedules are sometimes used.  The maintenance phase begins when the most effective dose is reached. This dose is different for each person, depending on how allergic you are and your response to the build-up injections. Once the maintenance dose is reached, there are longer periods between injections, typically two to four weeks.  Occasionally doctors give cortisone-type shots that can temporarily reduce allergy symptoms. These types of shots are different and should not be confused with allergy  immunotherapy shots.  Who Can Be Treated with Allergy Shots?  Allergy shots may be a good treatment approach for people with allergic rhinitis (hay fever), allergic asthma, conjunctivitis (eye allergy) or stinging insect allergy.   Before deciding to begin allergy shots, you should consider:   The length of allergy season and the severity of your symptoms  Whether medications and/or changes to your environment can control your symptoms  Your desire to avoid long-term medication use  Time: allergy immunotherapy requires a major time commitment  Cost: may vary depending on your insurance coverage  Allergy shots for children age 46 and older are effective and often well tolerated. They might prevent the onset of new allergen sensitivities or the progression to asthma.  Allergy shots are not started on patients who are pregnant but can be continued on patients who become pregnant while receiving them. In some patients with other medical conditions or who take certain common medications, allergy shots may be of risk. It is important to mention other medications you talk to your allergist.   When Will I Feel Better?  Some may experience decreased allergy symptoms during the build-up phase. For others, it may take as long as 12 months on the maintenance dose. If there is no improvement after a year of maintenance, your allergist will discuss other treatment options with you.  If you aren't responding to allergy shots, it may be because there is not enough dose of the allergen in your vaccine or there are missing allergens that were not identified during your allergy testing. Other reasons could be that there are high levels of the allergen in your environment or major exposure to non-allergic triggers like tobacco smoke.  What Is the Length of Treatment?  Once the maintenance dose is reached, allergy shots are generally continued for three to five years.  The decision to stop should be discussed  with your allergist at that time. Some people may experience a permanent reduction of allergy symptoms. Others may relapse and a longer course of allergy shots can be considered.  What Are the Possible Reactions?  The two types of adverse reactions that can occur with allergy shots are local and systemic. Common local reactions include very mild redness and swelling at the injection site, which can happen immediately or several hours after. A systemic reaction, which is less common, affects the entire body or a particular body system. They are usually mild and typically respond quickly to medications. Signs include increased allergy symptoms such as sneezing, a stuffy nose or hives.  Rarely, a serious systemic reaction called anaphylaxis can develop. Symptoms include swelling in the throat, wheezing, a feeling of tightness in the chest, nausea or dizziness. Most serious systemic reactions develop within 30 minutes of allergy shots. This is why it is strongly recommended you wait in your doctor's office for 30 minutes after your injections. Your allergist is trained to watch for reactions, and his or her staff is trained and equipped with the proper medications to identify and treat them.  Who Should Administer Allergy Shots?  The preferred location for receiving shots is your prescribing allergist's office. Injections can sometimes be given at another facility where the physician and staff are trained to recognize and treat reactions, and have received instructions by your prescribing allergist.       {Blank single:19197::"This note in its entirety was forwarded to the Provider who requested this consultation."}  Subjective:   CHERESE LOZANO is a 46 y.o. female presenting today for evaluation of  Chief Complaint  Patient presents with   Asthma   Urticaria   Pruritus    KIKUE GERHART has a history of the following: Patient Active Problem List   Diagnosis Date Noted   Generalized  abdominal pain 05/09/2022   Allergic reaction caused by a drug 04/04/2022   Oral thrush 04/04/2022   Type 2 diabetes mellitus with hyperglycemia ([place]) 04/04/2022   Fatty liver 02/21/2022   FH: colon cancer in relative diagnosed at >15 years old 02/21/2022   Daytime somnolence 01/11/2022   Morbid obesity ([place]) 01/11/2022   Psychophysiologic insomnia 01/11/2022   Dyslipidemia 10/15/2021   DOE (dyspnea on exertion) 10/15/2021   Essential hypertension 10/15/2021   Precordial chest pain 10/15/2021   Hypocalcemia 08/08/2021   Diabetes ([place]) 05/30/2021   Disc disease, degenerative, cervical 04/19/2021   Right carpal tunnel syndrome 04/19/2021   Irritable bowel syndrome 04/17/2021   Weight gain 12/20/2020   Asthma without status asthmaticus 07/29/2020   History of Graves' disease 07/29/2020   Postoperative hypothyroidism 07/29/2020   Diarrhea 05/08/2020   Idiopathic peripheral neuropathy 04/18/2020   Myelopathy ([place]) 04/18/2020   OSA (obstructive sleep apnea) 04/18/2020   RLS (restless legs syndrome) 04/18/2020   Sensory disturbance 01/20/2020   Atypical chest pain 01/20/2020   Obesity, Class III, BMI 40-49.9 (morbid obesity) ([place]) 01/20/2020   Weakness 01/19/2020   Hyperglycemia 10/04/2019   Myalgia 09/05/2019   Vitamin B 12 deficiency 07/28/2019   Right sided numbness 06/29/2019   Constipation 06/15/2019   Elevated lipase 06/15/2019   Hypomagnesemia 03/29/2019   Leukocytosis 03/29/2019   Pain of upper abdomen 02/10/2019   Nausea without vomiting 02/10/2019   Tingling of face 11/30/2018   Hot flashes 11/30/2018   Vitamin D deficiency 10/06/2018   Gastroesophageal reflux disease 01/28/2017   Rectal bleeding 01/28/2017  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   Acquired hypothyroidism 04/14/2016   Gastric polyp    RUQ abdominal pain 09/06/2014   Excessive or frequent menstruation 01/13/2013   Depression     History obtained from: chart review and {Persons; PED relatives  w/patient:19415::"patient"}.  IRAN KIEVIT was referred by Redmond School, MD.     Fry is a 46 y.o. female presenting for {Blank single:19197::"a food challenge","a drug challenge","skin testing","a sick visit","an evaluation of ***","a follow up visit"}. She was referred    Asthma/Respiratory Symptom History: She is currently "battling bronchitis". She gets that three times per year. She thinks that this current once was caused by a bad head cold. She normally does not get colds that precede the bronchitis. She is typically treated with doxycycline, but this causes her stomach to hurt. She was treated with Keflex this time. She has been fine with this despite a reaction to penicillin. He  started her on Symbicort one month ago. She got thrush and then was treated with nystatin. She was in the hospital immediately and treated with epinephrine and IV Benadryl. There was difficulty breathing within an hour of drinking nystatin. She was treated with Benadryl x2, EpiPen x2, IV famotidine was given, 50 mg IM x1 was given, Solu-Medrol 25 mg x 1 was given, IV hydration was provided. Apparently this corrected her allergic reaction subsided. She goes through 1-2 inhalers per month. She also uses a portable nebulizer which she feels makes a finer mist compared to the ones that plug into the wall.   When she was first diagnosed with asthma, she was evaluated by a Pulmonologist who she did not get along with. She was placed on Spiriva and Symbicort at that time. She thinks that she was getting thrush a bit from this, but it never worked completely. Instead, she was started on Atrovent nebulizer 2-4 times daily. She has not needed to do it four times per day, but the most is 3 times per day.   She saw Dr. Michela Pitcher as a second opinion and she was told that her GERD was not well controlled. She only saw him once, but he did not do anything for her at all.   Allergic Rhinitis Symptom History: She had allergy  testing done in [place] and she was allergic to 37 out of 42 items per the patient. This is when she was prescribed an EpiPen.  This was with [facility] (Dr. Ronney Lion). At the time, her insurance would not cover allergy shots. Now she is on Medicaid and she can get those done. She is currently on Flonase and Zyrtec. She does still have some issues with this.   {Blank single:19197::"Food Allergy Symptom History: ***"," "}  {Blank single:19197::"Skin Symptom History: ***"," "}  {Blank single:19197::"GERD Symptom History: ***"," "}  ***Otherwise, there is no history of other atopic diseases, including {Blank multiple:19196:o:"asthma","food allergies","drug allergies","environmental allergies","stinging insect allergies","eczema","urticaria","contact dermatitis"}. There is no significant infectious history. ***Vaccinations are up to date.    Past Medical History: Patient Active Problem List   Diagnosis Date Noted   Generalized abdominal pain 05/09/2022   Allergic reaction caused by a drug 04/04/2022   Oral thrush 04/04/2022   Type 2 diabetes mellitus with hyperglycemia ([place]) 04/04/2022   Fatty liver 02/21/2022   FH: colon cancer in relative diagnosed at >59 years old 02/21/2022   Daytime somnolence 01/11/2022   Morbid obesity ([place]) 01/11/2022   Psychophysiologic insomnia 01/11/2022   Dyslipidemia 10/15/2021   DOE (dyspnea on exertion) 10/15/2021  Essential hypertension 10/15/2021   Precordial chest pain 10/15/2021   Hypocalcemia 08/08/2021   Diabetes ([place]) 05/30/2021   Disc disease, degenerative, cervical 04/19/2021   Right carpal tunnel syndrome 04/19/2021   Irritable bowel syndrome 04/17/2021   Weight gain 12/20/2020   Asthma without status asthmaticus 07/29/2020   History of Graves' disease 07/29/2020   Postoperative hypothyroidism 07/29/2020   Diarrhea 05/08/2020   Idiopathic peripheral neuropathy 04/18/2020   Myelopathy ([place]) 04/18/2020   OSA (obstructive sleep  apnea) 04/18/2020   RLS (restless legs syndrome) 04/18/2020   Sensory disturbance 01/20/2020   Atypical chest pain 01/20/2020   Obesity, Class III, BMI 40-49.9 (morbid obesity) ([place]) 01/20/2020   Weakness 01/19/2020   Hyperglycemia 10/04/2019   Myalgia 09/05/2019   Vitamin B 12 deficiency 07/28/2019   Right sided numbness 06/29/2019   Constipation 06/15/2019   Elevated lipase 06/15/2019   Hypomagnesemia 03/29/2019   Leukocytosis 03/29/2019   Pain of upper abdomen 02/10/2019   Nausea without vomiting 02/10/2019   Tingling of face 11/30/2018   Hot flashes 11/30/2018   Vitamin D deficiency 10/06/2018   Gastroesophageal reflux disease 01/28/2017   Rectal bleeding 01/28/2017   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   Acquired hypothyroidism 04/14/2016   Gastric polyp    RUQ abdominal pain 09/06/2014   Excessive or frequent menstruation 01/13/2013   Depression     Medication List:  Allergies as of 06/06/2022       Reactions   Levofloxacin Other (See Comments)   Pt can not have due to taking Lexapro    Toradol [ketorolac Tromethamine] Anaphylaxis, Hives   Tramadol Hives   Levomenol    Other reaction(s): Other   Other Other (See Comments)   Chamomile Nausea And Vomiting   Lexapro   Hctz [hydrochlorothiazide] Other (See Comments)   Dehydration   Lisinopril Nausea Only   Losartan Potassium Other (See Comments)   GI upset   Nystatin    oral   Penicillins Other (See Comments)   Loopy, childhood allergy   Triamcinolone Acetonide    Elevated blood pressure, caused diabetes, changed tyroid electrolytes Patient has no tyroid          Medication List        Accurate as of June 06, 2022 11:59 PM. If you have any questions, ask your nurse or doctor.          acarbose 100 MG tablet Commonly known as: PRECOSE Take 100 mg by mouth 3 (three) times daily.   Accu-Chek Aviva Plus test strip Generic drug: glucose blood 1 each by Other route daily. And lancets 1/day    acetaminophen 500 MG tablet Commonly known as: TYLENOL Take 1,000 mg by mouth daily as needed for moderate pain.   albuterol 108 (90 Base) MCG/ACT inhaler Commonly known as: VENTOLIN HFA Inhale 2 puffs into the lungs every 6 (six) hours as needed.   APPLE CIDER VINEGAR PO Take by mouth.   azelastine 0.1 % nasal spray Commonly known as: ASTELIN 2 sprays per nostril 1-2 times daily. Started by: Valentina Shaggy, MD   cetirizine 10 MG tablet Commonly known as: ZYRTEC Take 10 mg by mouth daily.   dicyclomine 10 MG capsule Commonly known as: Bentyl Take 1 capsule (10 mg total) by mouth every 8 (eight) hours as needed for spasms. Take 1 capsule (10 mg total) by mouth up to 3 times daily before meals and at bedtime for abdominal cramping or diarrhea.  Hold in the setting of constipation.   EPINEPHrine 0.3 mg/0.3  mL Soaj injection Commonly known as: EPI-PEN Inject 0.3 mg into the muscle as needed for anaphylaxis.   EQUATE STOOL SOFTENER 100 MG capsule Generic drug: docusate sodium Take 100 mg by mouth 2 (two) times daily as needed.   escitalopram 20 MG tablet Commonly known as: LEXAPRO Take 1 tablet (20 mg total) by mouth at bedtime.   fluticasone 50 MCG/ACT nasal spray Commonly known as: FLONASE Place 2 sprays into both nostrils at bedtime.   glimepiride 1 MG tablet Commonly known as: AMARYL Take 1 mg by mouth daily.   ipratropium 0.02 % nebulizer solution Commonly known as: ATROVENT Take 0.5 mg by nebulization as needed.   levalbuterol 0.63 MG/3ML nebulizer solution Commonly known as: XOPENEX Take 3 mLs (0.63 mg total) by nebulization every 4 (four) hours as needed for wheezing or shortness of breath. Started by: Valentina Shaggy, MD   Magnesium 250 MG Tabs Take 250 mg by mouth at bedtime.   meclizine 25 MG tablet Commonly known as: ANTIVERT Take 25 mg by mouth 4 (four) times daily as needed.   NON FORMULARY at bedtime. CPAP at night   omeprazole 40  MG capsule Commonly known as: PRILOSEC Take 1 capsule (40 mg total) by mouth 2 (two) times daily before a meal.   rosuvastatin 10 MG tablet Commonly known as: CRESTOR Take 10 mg by mouth once a week.   Simethicone 250 MG Caps Take 500 mg by mouth at bedtime.   Spiriva Respimat 1.25 MCG/ACT Aers Generic drug: Tiotropium Bromide Monohydrate Inhale 2 puffs into the lungs daily. Started by: Valentina Shaggy, MD   sucralfate 1 GM/10ML suspension Commonly known as: CARAFATE TAKE 10 ML BY MOUTH  4 TIMES DAILY AS NEEDED   Synthroid 137 MCG tablet Generic drug: levothyroxine Take 137 mcg by mouth daily.   Vitamin D3 125 MCG (5000 UT) Caps Take 1 capsule (5,000 Units total) by mouth daily. What changed: how much to take        Birth History: {Blank single:19197::"non-contributory","born premature and spent time in the NICU","born at term without complications"}  Developmental History: Zairah has met all milestones on time. She has required no {Blank multiple:19196:a:"speech therapy","occupational therapy","physical therapy"}. ***non-contributory  Past Surgical History: Past Surgical History:  Procedure Laterality Date   ABDOMINAL HYSTERECTOMY     Per patient, uterine fibroids.   ABDOMINAL HYSTERECTOMY     CARPAL TUNNEL RELEASE Right 06/11/2021   Procedure: Right Carpel tunnel release;  Surgeon: Vallarie Mare, MD;  Location: [facility];  Service: Neurosurgery;  Laterality: Right;  RM 18   CHOLECYSTECTOMY N/A 11/06/2014   Procedure: LAPAROSCOPIC CHOLECYSTECTOMY;  Surgeon: Jamesetta So, MD;  Location: [facility];  Service: General;  Laterality: N/A;   COLONOSCOPY N/A 03/04/2017   Dr. Gala Romney: Hemorrhoids, diverticulosis, benign polyp without adenomatous changes.  Next colonoscopy 10 years   DILATION AND CURETTAGE OF UTERUS     ESOPHAGOGASTRODUODENOSCOPY N/A 10/02/2014   Dr. Gala Romney: fundic gland polyps, hiatal hernia   ESOPHAGOGASTRODUODENOSCOPY  07/2020   [facility] ;  normal esophagus biopsied, benign-appearing gastric polyps biopsied, gastric biopsies obtained to evaluate for H. pylori, normal duodenum.  Pathology with mild chronic gastritis, negative for H. pylori, fundic gland polyps, esophageal biopsy benign.   MOUTH SURGERY     extraction of teeth   POLYPECTOMY  03/04/2017   Procedure: POLYPECTOMY;  Surgeon: Daneil Dolin, MD;  Location: [facility];  Service: Endoscopy;;  colon   RIGHT OOPHORECTOMY  07/2021   THYROIDECTOMY N/A  09/30/2018   Procedure: TOTAL THYROIDECTOMY;  Surgeon: Darnell Level, MD;  Location: [facility];  Service: General;  Laterality: N/A;   TUBAL LIGATION       Family History: Family History  Problem Relation Age of Onset   Allergic rhinitis Mother    Uterine cancer Mother        ?cervical cancer   Allergic rhinitis Father    Colon cancer Father 26   Hypertension Father    Diabetes Father    Colon polyps Father        age less than 73   Uterine cancer Sister        suicide   Colon cancer Paternal Aunt 36       also had skin cancer, ovarian?   Depression Other    Thyroid disease Neg Hx    Pancreatic cancer Neg Hx      Social History: Mayson lives at home with ***.  She lives in a mobile home that is 46 years old.  There is carpeting in the main living areas as well as the bedroom.  She has a pump for heating and cooling.  There is 67-year-old cefepime in the home.  There are dust mite covers on the bed, but not the pillows.  There is exposure in the home.  She is currently on disability.  She is not exposed to fumes, chemicals, or dust.  She was a smoker herself for a period of two years. Her husband vapes in the home.    ROS     Objective:   Blood pressure 138/80, pulse 76, temperature 98.7 F (37.1 C), temperature source Temporal, resp. rate 16, height 5\' 4"  (1.626 m), weight 258 lb 12.8 oz (117.4 kg), last menstrual period 10/09/2014, SpO2 97 %. Body mass index is 44.42 kg/m.     Physical Exam    Diagnostic studies:    Spirometry: results abnormal (FEV1: 2.13/74%, FVC: 2.64/74%, FEV1/FVC: 81%).    Spirometry consistent with possible restrictive disease. Albuterol four puffs via MDI treatment given in clinic with improvement in FVC, but not significant per ATS criteria.  Allergy Studies: {Blank single:19197::"none","labs sent instead"," "}   Airborne Adult Perc - 06/06/22 1452     Time Antigen Placed 1441    Allergen Manufacturer 08/06/22    Location Back    Number of Test 59    1. Control-Buffer 50% Glycerol Negative    2. Control-Histamine 1 mg/ml 2+    3. Albumin saline Negative    4. [place] 2+    5. Waynette Buttery Negative    6. Johnson Negative    7. [place] Blue Negative    8. Meadow Fescue Negative    9. Perennial Rye Negative    10. Sweet Vernal Negative    11. Timothy Negative    12. Cocklebur Negative    13. Burweed Marshelder Negative    14. Ragweed, short Negative    15. Ragweed, Giant Negative    16. Plantain,  English Negative    17. Lamb's Quarters Negative    18. Sheep Sorrell Negative    19. Rough Pigweed Negative    20. Marsh Elder, Rough Negative    21. Mugwort, Common Negative    22. Ash mix Negative    23. Birch mix Negative    24. Beech American Negative    25. Box, Elder Negative    26. Cedar, red Negative    27. Cottonwood, [place] Negative    28. Elm mix Negative  29. Hickory Negative    30. Maple mix Negative    31. Oak, [place] mix Negative    32. Pecan Pollen Negative    33. Pine mix Negative    34. Sycamore Eastern Negative    35. [place], Black Pollen Negative    36. Alternaria alternata Negative    37. Cladosporium Herbarum Negative    38. Aspergillus mix Negative    39. Penicillium mix Negative    40. Bipolaris sorokiniana (Helminthosporium) Negative    41. Drechslera spicifera (Curvularia) Negative    42. Mucor plumbeus Negative    43. Fusarium moniliforme Negative    44. Aureobasidium pullulans (pullulara) Negative    45.  Rhizopus oryzae Negative    46. Botrytis cinera Negative    47. Epicoccum nigrum Negative    48. Phoma betae Negative    49. Candida Albicans Negative    50. Trichophyton mentagrophytes Negative    51. Mite, D Farinae  5,000 AU/ml 3+    52. Mite, D Pteronyssinus  5,000 AU/ml 3+    53. Cat Hair 10,000 BAU/ml Negative    54.  Dog Epithelia Negative    55. Mixed Feathers Negative    56. Horse Epithelia Negative    57. Cockroach, German Negative    58. Mouse Negative    59. Tobacco Leaf Negative             Intradermal - 06/06/22 1504     Time Antigen Placed 1505    Allergen Manufacturer Lavella Hammock    Location Arm    Number of Test 14    Intradermal Select    Control Negative    [place] Negative    Johnson Negative    7 Grass Negative    Ragweed mix Negative    Weed mix Negative    Tree mix Negative    Mold 1 Negative    Mold 2 2+    Mold 3 4+    Mold 4 3+    Cat Negative    Dog Negative    Cockroach Negative             {Blank single:19197::"Allergy testing results were read and interpreted by myself, documented by clinical staff."," "}         Salvatore Marvel, MD [facility]

## 2022-06-06 NOTE — Patient Instructions (Addendum)
1. Moderate persistent asthma, uncomplicated - Lung testing looks decent today.  - We are going to add on Spiriva two puffs once daily.  - I would stop your Atrovent (ipratropium) four times daily (since this is in the same class as the Spiriva). - We are going to get some labs to rule out difficult to control causes of breathing issues.  - Daily controller medication(s): Spiriva 1.25mg two puffs once daily - Rescue medications: Atrovent (ipratropium) one treatment daily as needed OR Xopenex nebulizer treatment every 6 hours as needed. - Asthma control goals:  * Full participation in all desired activities (may need albuterol before activity) * Albuterol use two time or less a week on average (not counting use with activity) * Cough interfering with sleep two time or less a month * Oral steroids no more than once a year * No hospitalizations  2. Perennial and seasonal allergic rhinitis - Testing today showed: indoor molds, outdoor molds, and dust mites. - Copy of test results provided.  - Avoidance measures provided. - Stop taking:  - Continue with: Flonase and Zyrtec - Start taking: Astelin (azelastine) 2 sprays per nostril 1-2 times daily as needed - You can use an extra dose of the antihistamine, if needed, for breakthrough symptoms.  - Consider nasal saline rinses 1-2 times daily to remove allergens from the nasal cavities as well as help with mucous clearance (this is especially helpful to do before the nasal sprays are given) - Consider allergy shots as a means of long-term control. - Allergy shots "re-train" and "reset" the immune system to ignore environmental allergens and decrease the resulting immune response to those allergens (sneezing, itchy watery eyes, runny nose, nasal congestion, etc).    - Allergy shots improve symptoms in 75-85% of patients.  - We would be able to do one vial for your allergy shots.   3. Return in about 8 weeks (around 08/01/2022).    Please inform  [place] any Emergency Department visits, hospitalizations, or changes in symptoms. Call [place]efore going to the ED for breathing or allergy symptoms since we might be able to fit you in for a sick visit. Feel free to contact u[place]anytime with any questions, problems, or concerns.  It was a pleasure to meet you today!  Websites that have reliable patient information: 1. American Academy of Asthma, Allergy, and Immunology: www.aaaai.org 2. Food Allergy Research and Education (FARE): foodallergy.org 3. Mothers of Asthmatics: http://www.asthmacommunitynetwork.org 4. American College of Allergy, Asthma, and Immunology: www.acaai.org   COVID-19 Vaccine Information can be found at: [email or web address] questions related to vaccine distribution or appointments, please email [email or web address] or call [phone]   We realize that you might be concerned about having an allergic reaction to the COVID19 vaccines. To help with that concern, WE ARE OFFERING THE COVID19 VACCINES IN OUR OFFICE! Ask the front desk for dates!     "Like" [facility] Facebook and Instagram for our latest updates!      A healthy democracy works best when ANew York Life Insuranceparticipate! Make sure you are registered to vote! If you have moved or changed any of your contact information, you will need to get this updated before voting!  In some cases, you MAY be able to register to vote online: [email or web address]      Airborne Adult Perc - 06/06/22 1452     Time Antigen Placed 1441    Allergen Manufacturer GLavella Hammock   Location Back    Number of Test 59  1. Control-Buffer 50% Glycerol Negative    2. Control-Histamine 1 mg/ml 2+    3. Albumin saline Negative    4. [place] 2+    5. [place] Negative    6. Johnson Negative    7. [place] Blue Negative    8. Meadow Fescue Negative    9. Perennial Rye Negative    10. Sweet Vernal Negative     11. Timothy Negative    12. Cocklebur Negative    13. Burweed Marshelder Negative    14. Ragweed, short Negative    15. Ragweed, Giant Negative    16. Plantain,  English Negative    17. Lamb's Quarters Negative    18. Sheep Sorrell Negative    19. Rough Pigweed Negative    20. Marsh Elder, Rough Negative    21. Mugwort, Common Negative    22. Ash mix Negative    23. Birch mix Negative    24. Beech American Negative    25. Box, Elder Negative    26. Cedar, red Negative    27. Cottonwood, [place] Negative    28. Elm mix Negative    29. Hickory Negative    30. Maple mix Negative    31. Oak, [place] mix Negative    32. Pecan Pollen Negative    33. Pine mix Negative    34. Sycamore Eastern Negative    35. [place], Black Pollen Negative    36. Alternaria alternata Negative    37. Cladosporium Herbarum Negative    38. Aspergillus mix Negative    39. Penicillium mix Negative    40. Bipolaris sorokiniana (Helminthosporium) Negative    41. Drechslera spicifera (Curvularia) Negative    42. Mucor plumbeus Negative    43. Fusarium moniliforme Negative    44. Aureobasidium pullulans (pullulara) Negative    45. Rhizopus oryzae Negative    46. Botrytis cinera Negative    47. Epicoccum nigrum Negative    48. Phoma betae Negative    49. Candida Albicans Negative    50. Trichophyton mentagrophytes Negative    51. Mite, D Farinae  5,000 AU/ml 3+    52. Mite, D Pteronyssinus  5,000 AU/ml 3+    53. Cat Hair 10,000 BAU/ml Negative    54.  Dog Epithelia Negative    55. Mixed Feathers Negative    56. Horse Epithelia Negative    57. Cockroach, German Negative    58. Mouse Negative    59. Tobacco Leaf Negative             Intradermal - 06/06/22 1504     Time Antigen Placed 1505    Allergen Manufacturer Lavella Hammock    Location Arm    Number of Test 14    Intradermal Select    Control Negative    [place] Negative    Johnson Negative    7 Grass Negative    Ragweed mix Negative    Weed  mix Negative    Tree mix Negative    Mold 1 Negative    Mold 2 2+    Mold 3 4+    Mold 4 3+    Cat Negative    Dog Negative    Cockroach Negative             Control of Dust Mite Allergen    Dust mites play a major role in allergic asthma and rhinitis.  They occur in environments with high humidity wherever human skin is found.  Dust mites absorb humidity from  the atmosphere (ie, they do not drink) and feed on organic matter (including shed human and animal skin).  Dust mites are a microscopic type of insect that you cannot see with the naked eye.  High levels of dust mites have been detected from mattresses, pillows, carpets, upholstered furniture, bed covers, clothes, soft toys and any woven material.  The principal allergen of the dust mite is found in its feces.  A gram of dust may contain 1,000 mites and 250,000 fecal particles.  Mite antigen is easily measured in the air during house cleaning activities.  Dust mites do not bite and do not cause harm to humans, other than by triggering allergies/asthma.    Ways to decrease your exposure to dust mites in your home:  Encase mattresses, box springs and pillows with a mite-impermeable barrier or cover   Wash sheets, blankets and drapes weekly in hot water (130 F) with detergent and dry them in a dryer on the hot setting.  Have the room cleaned frequently with a vacuum cleaner and a damp dust-mop.  For carpeting or rugs, vacuuming with a vacuum cleaner equipped with a high-efficiency particulate air (HEPA) filter.  The dust mite allergic individual should not be in a room which is being cleaned and should wait 1 hour after cleaning before going into the room. Do not sleep on upholstered furniture (eg, couches).   If possible removing carpeting, upholstered furniture and drapery from the home is ideal.  Horizontal blinds should be eliminated in the rooms where the person spends the most time (bedroom, study, television room).  Washable  vinyl, roller-type shades are optimal. Remove all non-washable stuffed toys from the bedroom.  Wash stuffed toys weekly like sheets and blankets above.   Reduce indoor humidity to less than 50%.  Inexpensive humidity monitors can be purchased at most hardware stores.  Do not use a humidifier as can make the problem worse and are not recommended.   Control of Mold Allergen   Mold and fungi can grow on a variety of surfaces provided certain temperature and moisture conditions exist.  Outdoor molds grow on plants, decaying vegetation and soil.  The major outdoor mold, Alternaria and Cladosporium, are found in very high numbers during hot and dry conditions.  Generally, a late Summer - Fall peak is seen for common outdoor fungal spores.  Rain will temporarily lower outdoor mold spore count, but counts rise rapidly when the rainy period ends.  The most important indoor molds are Aspergillus and Penicillium.  Dark, humid and poorly ventilated basements are ideal sites for mold growth.  The next most common sites of mold growth are the bathroom and the kitchen.  Outdoor (Seasonal) Mold Control  Positive outdoor molds via skin testing: Bipolaris (Helminthsporium), Drechslera (Curvalaria), and Mucor  Use air conditioning and keep windows closed Avoid exposure to decaying vegetation. Avoid leaf raking. Avoid grain handling. Consider wearing a face mask if working in moldy areas.    Indoor (Perennial) Mold Control   Positive indoor molds via skin testing: Aspergillus, Penicillium, Fusarium, Aureobasidium (Pullulara), and Rhizopus  Maintain humidity below 50%. Clean washable surfaces with 5% bleach solution. Remove sources e.g. contaminated carpets.    Allergy Shots   Allergies are the result of a chain reaction that starts in the immune system. Your immune system controls how your body defends itself. For instance, if you have an allergy to pollen, your immune system identifies pollen as an  invader or allergen. Your immune system overreacts by producing  antibodies called Immunoglobulin E (IgE). These antibodies travel to cells that release chemicals, causing an allergic reaction.  The concept behind allergy immunotherapy, whether it is received in the form of shots or tablets, is that the immune system can be desensitized to specific allergens that trigger allergy symptoms. Although it requires time and patience, the payback can be long-term relief.  How Do Allergy Shots Work?  Allergy shots work much like a vaccine. Your body responds to injected amounts of a particular allergen given in increasing doses, eventually developing a resistance and tolerance to it. Allergy shots can lead to decreased, minimal or no allergy symptoms.  There generally are two phases: build-up and maintenance. Build-up often ranges from three to six months and involves receiving injections with increasing amounts of the allergens. The shots are typically given once or twice a week, though more rapid build-up schedules are sometimes used.  The maintenance phase begins when the most effective dose is reached. This dose is different for each person, depending on how allergic you are and your response to the build-up injections. Once the maintenance dose is reached, there are longer periods between injections, typically two to four weeks.  Occasionally doctors give cortisone-type shots that can temporarily reduce allergy symptoms. These types of shots are different and should not be confused with allergy immunotherapy shots.  Who Can Be Treated with Allergy Shots?  Allergy shots may be a good treatment approach for people with allergic rhinitis (hay fever), allergic asthma, conjunctivitis (eye allergy) or stinging insect allergy.   Before deciding to begin allergy shots, you should consider:   The length of allergy season and the severity of your symptoms  Whether medications and/or changes to your  environment can control your symptoms  Your desire to avoid long-term medication use  Time: allergy immunotherapy requires a major time commitment  Cost: may vary depending on your insurance coverage  Allergy shots for children age 43 and older are effective and often well tolerated. They might prevent the onset of new allergen sensitivities or the progression to asthma.  Allergy shots are not started on patients who are pregnant but can be continued on patients who become pregnant while receiving them. In some patients with other medical conditions or who take certain common medications, allergy shots may be of risk. It is important to mention other medications you talk to your allergist.   When Will I Feel Better?  Some may experience decreased allergy symptoms during the build-up phase. For others, it may take as long as 12 months on the maintenance dose. If there is no improvement after a year of maintenance, your allergist will discuss other treatment options with you.  If you aren't responding to allergy shots, it may be because there is not enough dose of the allergen in your vaccine or there are missing allergens that were not identified during your allergy testing. Other reasons could be that there are high levels of the allergen in your environment or major exposure to non-allergic triggers like tobacco smoke.  What Is the Length of Treatment?  Once the maintenance dose is reached, allergy shots are generally continued for three to five years. The decision to stop should be discussed with your allergist at that time. Some people may experience a permanent reduction of allergy symptoms. Others may relapse and a longer course of allergy shots can be considered.  What Are the Possible Reactions?  The two types of adverse reactions that can occur with allergy shots are local  and systemic. Common local reactions include very mild redness and swelling at the injection site, which can  happen immediately or several hours after. A systemic reaction, which is less common, affects the entire body or a particular body system. They are usually mild and typically respond quickly to medications. Signs include increased allergy symptoms such as sneezing, a stuffy nose or hives.  Rarely, a serious systemic reaction called anaphylaxis can develop. Symptoms include swelling in the throat, wheezing, a feeling of tightness in the chest, nausea or dizziness. Most serious systemic reactions develop within 30 minutes of allergy shots. This is why it is strongly recommended you wait in your doctor's office for 30 minutes after your injections. Your allergist is trained to watch for reactions, and his or her staff is trained and equipped with the proper medications to identify and treat them.  Who Should Administer Allergy Shots?  The preferred location for receiving shots is your prescribing allergist's office. Injections can sometimes be given at another facility where the physician and staff are trained to recognize and treat reactions, and have received instructions by your prescribing allergist.

## 2022-06-08 ENCOUNTER — Other Ambulatory Visit: Payer: Self-pay | Admitting: Gastroenterology

## 2022-06-08 ENCOUNTER — Encounter: Payer: Self-pay | Admitting: Allergy & Immunology

## 2022-06-08 DIAGNOSIS — J302 Other seasonal allergic rhinitis: Secondary | ICD-10-CM | POA: Insufficient documentation

## 2022-06-08 DIAGNOSIS — Z889 Allergy status to unspecified drugs, medicaments and biological substances status: Secondary | ICD-10-CM | POA: Insufficient documentation

## 2022-06-08 DIAGNOSIS — J4540 Moderate persistent asthma, uncomplicated: Secondary | ICD-10-CM | POA: Insufficient documentation

## 2022-06-11 LAB — ASPERGILLUS PRECIPITINS
A.Fumigatus #1 Abs: NEGATIVE
Aspergillus Flavus Antibodies: NEGATIVE
Aspergillus Niger Antibodies: NEGATIVE
Aspergillus glaucus IgG: NEGATIVE
Aspergillus nidulans IgG: NEGATIVE
Aspergillus terreus IgG: NEGATIVE

## 2022-06-11 LAB — CBC WITH DIFFERENTIAL/PLATELET
Basophils Absolute: 0 10*3/uL (ref 0.0–0.2)
Basos: 0 %
EOS (ABSOLUTE): 0.1 10*3/uL (ref 0.0–0.4)
Eos: 1 %
Hematocrit: 38.5 % (ref 34.0–46.6)
Hemoglobin: 12.9 g/dL (ref 11.1–15.9)
Immature Grans (Abs): 0 10*3/uL (ref 0.0–0.1)
Immature Granulocytes: 0 %
Lymphocytes Absolute: 2.3 10*3/uL (ref 0.7–3.1)
Lymphs: 27 %
MCH: 27.2 pg (ref 26.6–33.0)
MCHC: 33.5 g/dL (ref 31.5–35.7)
MCV: 81 fL (ref 79–97)
Monocytes Absolute: 0.4 10*3/uL (ref 0.1–0.9)
Monocytes: 4 %
Neutrophils Absolute: 5.7 10*3/uL (ref 1.4–7.0)
Neutrophils: 68 %
Platelets: 294 10*3/uL (ref 150–450)
RBC: 4.75 x10E6/uL (ref 3.77–5.28)
RDW: 14.6 % (ref 11.7–15.4)
WBC: 8.5 10*3/uL (ref 3.4–10.8)

## 2022-06-11 LAB — ANCA TITERS
Atypical pANCA: <1:20 {titer}
C-ANCA: <1:20 {titer}
P-ANCA: <1:20 {titer}

## 2022-06-11 LAB — IGE: IgE (Immunoglobulin E), Serum: 158 IU/mL (ref 6–495)

## 2022-06-11 LAB — ALPHA-1-ANTITRYPSIN: A-1 Antitrypsin: 129 mg/dL (ref 101–187)

## 2022-06-13 ENCOUNTER — Telehealth: Payer: Self-pay

## 2022-06-13 DIAGNOSIS — J3089 Other allergic rhinitis: Secondary | ICD-10-CM

## 2022-06-13 NOTE — Telephone Encounter (Signed)
Wrote script and routed to the Immunotherapy Team.  Salvatore Marvel, MD Allergy and [place] [facility]

## 2022-06-13 NOTE — Telephone Encounter (Signed)
Patient came by today and requested to schedule her new start allergy injections. She is scheduled for 104/23 in [facility].  Patient is also requesting to move forward with starting the process for Xolair.   Tammy could you give the patient a call to discuss. She is going home to read up on Xolair.

## 2022-06-16 DIAGNOSIS — J3089 Other allergic rhinitis: Secondary | ICD-10-CM

## 2022-06-16 NOTE — Progress Notes (Signed)
Aeroallergen Immunotherapy   Ordering Provider: Dr. Salvatore Marvel   Patient Details  Name: Heather Fry  MRN: [id number]  Date of Birth: [birth year]   Order 1 of 1   Vial Label: DM/Molds   0.2 ml (Volume)  1:10 Concentration -- Aspergillus mix  0.2 ml (Volume)  1:10 Concentration -- Penicillium mix  0.2 ml (Volume)  1:20 Concentration -- Bipolaris sorokiniana  0.2 ml (Volume)  1:20 Concentration -- Drechslera spicifera  0.2 ml (Volume)  1:10 Concentration -- Mucor plumbeus  0.2 ml (Volume)  1:10 Concentration -- Fusarium moniliforme  0.2 ml (Volume)  1:40 Concentration -- Aureobasidium pullulans  0.2 ml (Volume)  1:10 Concentration -- Rhizopus oryzae  0.7 ml (Volume)   AU Concentration -- Mite Mix (DF 5,000 & DP 5,000)    2.3  ml Extract Subtotal  2.7  ml Diluent  5.0  ml Maintenance Total   Schedule:  B   Blue Vial (1:100,000): Schedule B (6 doses)  Yellow Vial (1:10,000): Schedule B (6 doses)  Green Vial (1:1,000): Schedule B (6 doses)  Red Vial (1:100): Schedule A (14 doses)   Special Instructions: none

## 2022-06-16 NOTE — Progress Notes (Signed)
VIALS EXP 06-17-23

## 2022-06-20 ENCOUNTER — Telehealth: Payer: Self-pay

## 2022-06-20 NOTE — Telephone Encounter (Signed)
Patient called stating she is unable to take Spiriva. She went to her PCP at [facility] and he diagnosed her wit Bronchitis and believes is is the Spiriva doing it. He did start her on cephalexin. Patient states she had to stop Spiriva before. Patient states she is very sensitive to medications since she doesn't have her thyroid anymore. She is wondering if she should keep doing the neb treatments only or do you want to switch her to another inhaler?  [facility] [place]

## 2022-06-24 MED ORDER — IPRATROPIUM BROMIDE 0.02 % IN SOLN: 0.5000 mg | Freq: Four times a day (QID) | RESPIRATORY_TRACT | 2 refills | Status: DC | PRN

## 2022-06-24 NOTE — Addendum Note (Signed)
Addended by: Clovis Cao A on: 06/24/2022 04:32 PM   Modules accepted: Orders

## 2022-06-24 NOTE — Telephone Encounter (Signed)
Called and spoke to patient and informed her of the note per Dr. Ernst Bowler and she verbally expressed understanding. I sent in a refill of the the Atrovent to the pharmacy of patients choice. No other questions or concerns were addressed during this call.

## 2022-06-24 NOTE — Telephone Encounter (Signed)
That is unfortunate. We cannot use any steroid containing inhalers because of her thrush. I guess I would just do Atrovent neb treatments every 6 hours AS NEEDED. We could look into starting an injectable medication for her asthma if needed.   Salvatore Marvel, MD [facility] [place] [place]

## 2022-07-02 ENCOUNTER — Ambulatory Visit (INDEPENDENT_AMBULATORY_CARE_PROVIDER_SITE_OTHER): Payer: Medicaid Other

## 2022-07-02 DIAGNOSIS — J309 Allergic rhinitis, unspecified: Secondary | ICD-10-CM | POA: Diagnosis not present

## 2022-07-02 MED ORDER — EPINEPHRINE 0.3 MG/0.3ML IJ SOAJ: 0.3000 mg | INTRAMUSCULAR | 1 refills | Status: DC | PRN

## 2022-07-02 NOTE — Progress Notes (Signed)
Immunotherapy   Patient Details  Name: Heather Fry MRN: [id number] Date of Birth: 1976-03-25  07/02/2022  [place] started injections for  dust mites, and molds.  Following schedule: B  Frequency:1 time per week Epi-Pen: Given Consent signed and patient instructions given as well as injection room hours. Patient waited in the lobby for thirty minutes without an issue.   Julius Bowels 07/02/2022, 2:41 PM

## 2022-07-09 ENCOUNTER — Ambulatory Visit (INDEPENDENT_AMBULATORY_CARE_PROVIDER_SITE_OTHER): Payer: Medicaid Other | Admitting: *Deleted

## 2022-07-09 DIAGNOSIS — J309 Allergic rhinitis, unspecified: Secondary | ICD-10-CM

## 2022-07-14 ENCOUNTER — Telehealth: Payer: Self-pay

## 2022-07-14 NOTE — Telephone Encounter (Signed)
Patient called and expressed that she was wanting to stop allergy injections as she has had two reactions where she felt like she had the flu(Diarrhea, tiredness, and more) Patient expressed that she contacted her PCP as this happened both times after her injections and once before her second injection. Patient expressed she went to her PCP as she though she had the Flu or Covid.  I informed patient to come in and sign the discontinuation forms. Patient verbalized understanding.   I have filled out the discontinuation form and placed it up front in the [place] office for patient to sign and receive a copy.

## 2022-07-15 NOTE — Telephone Encounter (Signed)
Noted. Thanks!   Salvatore Marvel, MD [facility] [place] [place]

## 2022-07-16 ENCOUNTER — Telehealth: Payer: Medicaid Other | Admitting: Family

## 2022-07-16 DIAGNOSIS — R197 Diarrhea, unspecified: Secondary | ICD-10-CM | POA: Diagnosis not present

## 2022-07-16 DIAGNOSIS — R5383 Other fatigue: Secondary | ICD-10-CM

## 2022-07-16 DIAGNOSIS — R109 Unspecified abdominal pain: Secondary | ICD-10-CM | POA: Diagnosis not present

## 2022-07-16 NOTE — Progress Notes (Signed)
Virtual Visit Consent   Heather Fry, you are scheduled for a virtual visit with a [facility] provider today. Just as with appointments in the office, your consent must be obtained to participate. Your consent will be active for this visit and any virtual visit you may have with one of our providers in the next 365 days. If you have a MyChart account, a copy of this consent can be sent to you electronically.  As this is a virtual visit, video technology does not allow for your provider to perform a traditional examination. This may limit your provider's ability to fully assess your condition. If your provider identifies any concerns that need to be evaluated in person or the need to arrange testing (such as labs, EKG, etc.), we will make arrangements to do so. Although advances in technology are sophisticated, we cannot ensure that it will always work on either your end or our end. If the connection with a video visit is poor, the visit may have to be switched to a telephone visit. With either a video or telephone visit, we are not always able to ensure that we have a secure connection.  By engaging in this virtual visit, you consent to the provision of healthcare and authorize for your insurance to be billed (if applicable) for the services provided during this visit. Depending on your insurance coverage, you may receive a charge related to this service.  I need to obtain your verbal consent now. Are you willing to proceed with your visit today? Heather Fry has provided verbal consent on 07/16/2022 for a virtual visit (video or telephone). Evelina Dun, FNP  Date: 07/16/2022 4:40 PM  Virtual Visit via Video Note   I, Evelina Dun, connected with  Heather Fry  ([id number], 1976/07/22) on 07/16/22 at  5:00 PM EDT by a video-enabled telemedicine application and verified that I am speaking with the correct person using two identifiers.  Location: Patient: Virtual Visit Location  Patient: Home Provider: Virtual Visit Location Provider: Home Office   I discussed the limitations of evaluation and management by telemedicine and the availability of in person appointments. The patient expressed understanding and agreed to proceed.    History of Present Illness: Heather Fry is a 46 y.o. who identifies as a female who was assigned female at birth, and is being seen today for diarrhea and abdominal cramping. She reports she started getting allergy shots two weeks and noticed some slight diarrhea. Then last  week she got her second shot on noticed increased diarrhea and abdominal pain. Her PCP told her to hold off on her allergy injections at this time.   She is followed by GI, but her GI office is closed.   She called her allergy doctor who told her to go to ED.   She took a bentyl and her abdominal cramps improved.   HPI: Diarrhea  This is a new problem. The current episode started 1 to 4 weeks ago. The problem occurs less than 2 times per day. The problem has been waxing and waning. Associated symptoms include bloating and increased flatus. Pertinent negatives include no chills, coughing, fever, headaches, myalgias, vomiting or weight loss. Associated symptoms comments: Nausea . There are no known risk factors. Treatments tried: bentyl.    Problems:  Patient Active Problem List   Diagnosis Date Noted   Moderate persistent asthma, uncomplicated 07/23/8526   Seasonal and perennial allergic rhinitis 06/08/2022   Multiple drug allergies 06/08/2022   Generalized abdominal  pain 05/09/2022   Allergic reaction caused by a drug 04/04/2022   Oral thrush 04/04/2022   Type 2 diabetes mellitus with hyperglycemia ([place]) 04/04/2022   Fatty liver 02/21/2022   FH: colon cancer in relative diagnosed at >75 years old 02/21/2022   Daytime somnolence 01/11/2022   Morbid obesity ([place]) 01/11/2022   Psychophysiologic insomnia 01/11/2022   Dyslipidemia 10/15/2021   DOE (dyspnea on  exertion) 10/15/2021   Essential hypertension 10/15/2021   Precordial chest pain 10/15/2021   Hypocalcemia 08/08/2021   Diabetes ([place]) 05/30/2021   Disc disease, degenerative, cervical 04/19/2021   Right carpal tunnel syndrome 04/19/2021   Irritable bowel syndrome 04/17/2021   Weight gain 12/20/2020   Asthma without status asthmaticus 07/29/2020   History of Graves' disease 07/29/2020   Postoperative hypothyroidism 07/29/2020   Diarrhea 05/08/2020   Idiopathic peripheral neuropathy 04/18/2020   Myelopathy ([place]) 04/18/2020   OSA (obstructive sleep apnea) 04/18/2020   RLS (restless legs syndrome) 04/18/2020   Sensory disturbance 01/20/2020   Atypical chest pain 01/20/2020   Obesity, Class III, BMI 40-49.9 (morbid obesity) ([place]) 01/20/2020   Weakness 01/19/2020   Hyperglycemia 10/04/2019   Myalgia 09/05/2019   Vitamin B 12 deficiency 07/28/2019   Right sided numbness 06/29/2019   Constipation 06/15/2019   Elevated lipase 06/15/2019   Hypomagnesemia 03/29/2019   Leukocytosis 03/29/2019   Pain of upper abdomen 02/10/2019   Nausea without vomiting 02/10/2019   Tingling of face 11/30/2018   Hot flashes 11/30/2018   Vitamin D deficiency 10/06/2018   Gastroesophageal reflux disease 01/28/2017   Rectal bleeding 01/28/2017   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   Acquired hypothyroidism 04/14/2016   Gastric polyp    RUQ abdominal pain 09/06/2014   Excessive or frequent menstruation 01/13/2013   Depression     Allergies:  Allergies  Allergen Reactions   Levofloxacin Other (See Comments)    Pt can not have due to taking Lexapro    Toradol [Ketorolac Tromethamine] Anaphylaxis and Hives   Tramadol Hives   Levomenol     Other reaction(s): Other   Other Other (See Comments)   Chamomile Nausea And Vomiting    Lexapro   Hctz [Hydrochlorothiazide] Other (See Comments)    Dehydration   Lisinopril Nausea Only   Losartan Potassium Other (See Comments)    GI upset   Nystatin     oral    Penicillins Other (See Comments)    Loopy, childhood allergy     Triamcinolone Acetonide     Elevated blood pressure, caused diabetes, changed tyroid electrolytes Patient has no tyroid     Medications:  Current Outpatient Medications:    acarbose (PRECOSE) 100 MG tablet, Take 100 mg by mouth 3 (three) times daily., Disp: , Rfl:    acetaminophen (TYLENOL) 500 MG tablet, Take 1,000 mg by mouth daily as needed for moderate pain., Disp: , Rfl:    albuterol (VENTOLIN HFA) 108 (90 Base) MCG/ACT inhaler, Inhale 2 puffs into the lungs every 6 (six) hours as needed., Disp: 18 g, Rfl: 11   APPLE CIDER VINEGAR PO, Take by mouth., Disp: , Rfl:    azelastine (ASTELIN) 0.1 % nasal spray, 2 sprays per nostril 1-2 times daily., Disp: 30 mL, Rfl: 5   cetirizine (ZYRTEC) 10 MG tablet, Take 10 mg by mouth daily., Disp: , Rfl:    Cholecalciferol (VITAMIN D3) 125 MCG (5000 UT) CAPS, Take 1 capsule (5,000 Units total) by mouth daily. (Patient taking differently: Take 10,000 Units by mouth daily.), Disp: 30 capsule, Rfl: 11  dicyclomine (BENTYL) 10 MG capsule, Take 1 capsule (10 mg total) by mouth every 8 (eight) hours as needed for spasms. Take 1 capsule (10 mg total) by mouth up to 3 times daily before meals and at bedtime for abdominal cramping or diarrhea.  Hold in the setting of constipation., Disp: , Rfl:    EPINEPHrine 0.3 mg/0.3 mL IJ SOAJ injection, Inject 0.3 mg into the muscle as needed for anaphylaxis., Disp: 2 each, Rfl: 1   EQUATE STOOL SOFTENER 100 MG capsule, Take 100 mg by mouth 2 (two) times daily as needed., Disp: , Rfl:    escitalopram (LEXAPRO) 20 MG tablet, Take 1 tablet (20 mg total) by mouth at bedtime., Disp: 30 tablet, Rfl: 5   fluticasone (FLONASE) 50 MCG/ACT nasal spray, Place 2 sprays into both nostrils at bedtime., Disp: , Rfl:    glimepiride (AMARYL) 1 MG tablet, Take 1 mg by mouth daily., Disp: , Rfl:    glucose blood (ACCU-CHEK AVIVA PLUS) test strip, 1 each by Other route daily. And  lancets 1/day, Disp: 100 each, Rfl: 3   ipratropium (ATROVENT) 0.02 % nebulizer solution, Take 2.5 mLs (0.5 mg total) by nebulization every 6 (six) hours as needed., Disp: 150 mL, Rfl: 2   levalbuterol (XOPENEX) 0.63 MG/3ML nebulizer solution, Take 3 mLs (0.63 mg total) by nebulization every 4 (four) hours as needed for wheezing or shortness of breath., Disp: 75 mL, Rfl: 1   Magnesium 250 MG TABS, Take 250 mg by mouth at bedtime. , Disp: , Rfl:    meclizine (ANTIVERT) 25 MG tablet, Take 25 mg by mouth 4 (four) times daily as needed., Disp: , Rfl:    NON FORMULARY, at bedtime. CPAP at night, Disp: , Rfl:    omeprazole (PRILOSEC) 40 MG capsule, Take 1 capsule (40 mg total) by mouth 2 (two) times daily before a meal., Disp: 60 capsule, Rfl: 3   rosuvastatin (CRESTOR) 10 MG tablet, Take 10 mg by mouth once a week., Disp: , Rfl:    Simethicone 250 MG CAPS, Take 500 mg by mouth at bedtime., Disp: , Rfl:    sucralfate (CARAFATE) 1 GM/10ML suspension, TAKE 10 ML BY MOUTH  4 TIMES DAILY AS NEEDED, Disp: 828 mL, Rfl: 0   SYNTHROID 137 MCG tablet, Take 137 mcg by mouth daily., Disp: , Rfl:   Observations/Objective: Patient is well-developed, well-nourished in no acute distress.  Resting comfortably  at home.  Head is normocephalic, atraumatic.  No labored breathing.  Speech is clear and coherent with logical content.  Patient is alert and oriented at baseline.    Assessment and Plan: 1. Diarrhea, unspecified type  2. Other fatigue  3. Abdominal cramping  Recommend her to take Bentyl QID  Bland diet Imodium as needed Call GI office in AM Red flags discussed to go to ED. Do not think she needs to go at this time as she is stable and has no fever and pain improving after medication.     Follow Up Instructions: I discussed the assessment and treatment plan with the patient. The patient was provided an opportunity to ask questions and all were answered. The patient agreed with the plan and  demonstrated an understanding of the instructions.  A copy of instructions were sent to the patient via MyChart unless otherwise noted below.     The patient was advised to call back or seek an in-person evaluation if the symptoms worsen or if the condition fails to improve as anticipated.  Time:  I spent 10 minutes with the patient via telehealth technology discussing the above problems/concerns.    Evelina Dun, FNP

## 2022-07-16 NOTE — Patient Instructions (Signed)
Diarrhea, Adult Diarrhea is frequent loose and watery bowel movements. Diarrhea can make you feel weak and cause you to become dehydrated. Dehydration can make you tired and thirsty, cause you to have a dry mouth, and decrease how often you urinate. Diarrhea typically lasts 2-3 days. However, it can last longer if it is a sign of something more serious. It is important to treat your diarrhea as told by your health care provider. Follow these instructions at home: Eating and drinking     Follow these recommendations as told by your health care provider: Take an oral rehydration solution (ORS). This is an over-the-counter medicine that helps return your body to its normal balance of nutrients and water. It is found at pharmacies and retail stores. Drink plenty of fluids, such as water, ice chips, diluted fruit juice, and low-calorie sports drinks. You can drink milk also, if desired. Avoid drinking fluids that contain a lot of sugar or caffeine, such as energy drinks, sports drinks, and soda. Eat bland, easy-to-digest foods in small amounts as you are able. These foods include bananas, applesauce, rice, lean meats, toast, and crackers. Avoid alcohol. Avoid spicy or fatty foods.  Medicines Take over-the-counter and prescription medicines only as told by your health care provider. If you were prescribed an antibiotic medicine, take it as told by your health care provider. Do not stop using the antibiotic even if you start to feel better. General instructions  Wash your hands often using soap and water. If soap and water are not available, use a hand sanitizer. Others in the household should wash their hands as well. Hands should be washed: After using the toilet or changing a diaper. Before preparing, cooking, or serving food. While caring for a sick person or while visiting someone in a hospital. Drink enough fluid to keep your urine pale yellow. Rest at home while you recover. Watch your  condition for any changes. Take a warm bath to relieve any burning or pain from frequent diarrhea episodes. Keep all follow-up visits as told by your health care provider. This is important. Contact a health care provider if: You have a fever. Your diarrhea gets worse. You have new symptoms. You cannot keep fluids down. You feel light-headed or dizzy. You have a headache. You have muscle cramps. Get help right away if: You have chest pain. You feel extremely weak or you faint. You have bloody or black stools or stools that look like tar. You have severe pain, cramping, or bloating in your abdomen. You have trouble breathing or you are breathing very quickly. Your heart is beating very quickly. Your skin feels cold and clammy. You feel confused. You have signs of dehydration, such as: Dark urine, very little urine, or no urine. Cracked lips. Dry mouth. Sunken eyes. Sleepiness. Weakness. Summary Diarrhea is frequent loose and sometimes watery bowel movements. Diarrhea can make you feel weak and cause you to become dehydrated. Drink enough fluids to keep your urine pale yellow. Make sure that you wash your hands after using the toilet. If soap and water are not available, use hand sanitizer. Contact a health care provider if your diarrhea gets worse or you have new symptoms. Get help right away if you have signs of dehydration. This information is not intended to replace advice given to you by your health care provider. Make sure you discuss any questions you have with your health care provider. Document Revised: 12/06/2021 Document Reviewed: 03/27/2021 Elsevier Patient Education  2023 [employer] Inc.  

## 2022-07-18 ENCOUNTER — Encounter: Payer: Self-pay | Admitting: *Deleted

## 2022-07-18 ENCOUNTER — Ambulatory Visit (INDEPENDENT_AMBULATORY_CARE_PROVIDER_SITE_OTHER): Payer: Medicaid Other | Admitting: Internal Medicine

## 2022-07-18 ENCOUNTER — Telehealth: Payer: Self-pay | Admitting: *Deleted

## 2022-07-18 ENCOUNTER — Encounter: Payer: Self-pay | Admitting: Internal Medicine

## 2022-07-18 VITALS — BP 151/95 | HR 80 | Temp 98.0°F | Ht 64.0 in | Wt 262.0 lb

## 2022-07-18 DIAGNOSIS — Z800 Family history of malignant neoplasm of digestive organs: Secondary | ICD-10-CM

## 2022-07-18 DIAGNOSIS — K582 Mixed irritable bowel syndrome: Secondary | ICD-10-CM | POA: Diagnosis not present

## 2022-07-18 NOTE — Telephone Encounter (Signed)
PA:APPROVED Authorization #: [id number] via [facility] website.  Pt has been scheduled for 08/28/22 at 9:00 am. Pt states she has prep left over from previously scheduled colonoscopy. Will mail new instructions and pre-op date.

## 2022-07-18 NOTE — Patient Instructions (Signed)
It was good to see you again!  Congratulations on weight loss.  Immunotherapy appears not to agree with you.  Stay away from it.  Continue omeprazole 40 mg twice daily for reflux-take between meals.  (Refill x1 year)  Continue fiber supplement every day most of the time.  Agree with holding off when have bouts of diarrhea.  May use Zofran 4 mg ODT DT up to 4 times a day as needed for nausea (refill x1 year)  May use Bentyl 1 to 2  10 mg tablets before meals and at bedtime as needed for bouts of diarrhea (refill x1 year)  Because of your father's and your aunts history of colon cancer, you need to have a high risk colonoscopy now as discussed.  We will schedule that in the near future ASA 3  Further recommendations to follow.

## 2022-07-18 NOTE — Progress Notes (Unsigned)
Primary Care Physician:  Redmond School, MD Primary Gastroenterologist:  Dr.   Pre-Procedure History & Physical: HPI:  Heather Fry is a 46 y.o. female here for follow-up of IBS, fatty liver (normal LFTs).  Normal obesity.  Positive family history colon cancer.  Patient describes chronic mixed IBS predominantly constipation predominant well managed with oral [employer] brand fiber supplement taken every day.  The vast majority of the time she has normal bowel function.  She has occasional diarrhea when she does she takes Bentyl 10 to 20 mg for meals and at bedtime with good results.  Occasional nausea for which she takes Zofran 4 mg ODT.  Reflux symptoms well controlled on omeprazole 40 mg daily.  No dysphagia.  Negative colonoscopy 2018.  Since that time, patient's father diagnosed with colon cancer at age 46.  Patient's father sister diagnosed with colon cancer recently as well.  She has not had any rectal bleeding.  She is lost 25 pounds trying to get into the threshold weight so she can have her left knee replaced.  She has had extensive autoimmune work-up.  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  History of Graves' disease.  Celiac markers negative previously. Past Medical History:  Diagnosis Date   Allergic asthma    Anemia    Anxiety    Arthritis    Depression    Diabetes mellitus without complication (HCC)    Diverticulosis    Fatty liver    moderate to severe   GERD (gastroesophageal reflux disease)    Graves disease    Headache    tension, ocular migraines   Hypothyroidism    Internal hemorrhoids    Pneumonia    as an infant   Sleep apnea    Thyroid disease    Uterine fibroid     Past Surgical History:  Procedure Laterality Date   ABDOMINAL HYSTERECTOMY     Per patient, uterine fibroids.   ABDOMINAL HYSTERECTOMY     CARPAL TUNNEL RELEASE Right 06/11/2021   Procedure: Right Carpel tunnel release;  Surgeon: Vallarie Mare, MD;  Location: [facility];  Service: Neurosurgery;  Laterality: Right;  RM 18   CHOLECYSTECTOMY N/A 11/06/2014   Procedure: LAPAROSCOPIC CHOLECYSTECTOMY;  Surgeon: Jamesetta So, MD;  Location: [facility];  Service: General;  Laterality: N/A;   COLONOSCOPY N/A 03/04/2017   Dr. Gala Romney: Hemorrhoids, diverticulosis, benign polyp without adenomatous changes.  Next colonoscopy 10 years   DILATION AND CURETTAGE OF UTERUS     ESOPHAGOGASTRODUODENOSCOPY N/A 10/02/2014   Dr. Gala Romney: fundic gland polyps, hiatal hernia   ESOPHAGOGASTRODUODENOSCOPY  07/2020   [facility] ; normal esophagus biopsied, benign-appearing gastric polyps biopsied, gastric biopsies obtained to evaluate for H. pylori, normal duodenum.  Pathology with mild chronic gastritis, negative for H. pylori, fundic gland polyps, esophageal biopsy benign.   MOUTH SURGERY     extraction of teeth   POLYPECTOMY  03/04/2017   Procedure: POLYPECTOMY;  Surgeon: Daneil Dolin, MD;  Location: [facility];  Service: Endoscopy;;  colon   RIGHT OOPHORECTOMY  07/2021   THYROIDECTOMY N/A 09/30/2018   Procedure: TOTAL THYROIDECTOMY;  Surgeon: Armandina Gemma, MD;  Location: [facility];  Service: General;  Laterality: N/A;   TUBAL LIGATION      Prior to Admission medications   Medication Sig Start Date End Date Taking? Authorizing Provider  acarbose (PRECOSE) 100 MG tablet Take 100 mg by mouth 3 (three) times daily. 11/01/21  Yes [provider]  acetaminophen (TYLENOL) 500 MG tablet Take 1,000 mg by mouth daily as needed for moderate pain.   Yes [provider]  albuterol (VENTOLIN HFA) 108 (90 Base) MCG/ACT inhaler Inhale 2 puffs into the lungs every 6 (six) hours as needed. 12/13/19  Yes Noemi Chapel P, DO  azelastine (ASTELIN) 0.1 % nasal spray 2 sprays per nostril 1-2 times daily. 06/06/22  Yes Valentina Shaggy, MD  cetirizine (ZYRTEC) 10 MG tablet Take 10 mg by mouth daily.   Yes [provider]  Cholecalciferol (VITAMIN  D3) 125 MCG (5000 UT) CAPS Take 1 capsule (5,000 Units total) by mouth daily. Patient taking differently: Take 10,000 Units by mouth daily. 10/13/19  Yes Renato Shin, MD  dicyclomine (BENTYL) 10 MG capsule Take 1 capsule (10 mg total) by mouth every 8 (eight) hours as needed for spasms. Take 1 capsule (10 mg total) by mouth up to 3 times daily before meals and at bedtime for abdominal cramping or diarrhea.  Hold in the setting of constipation. 04/05/22  Yes Barton Dubois, MD  EPINEPHrine 0.3 mg/0.3 mL IJ SOAJ injection Inject 0.3 mg into the muscle as needed for anaphylaxis. 07/02/22  Yes Valentina Shaggy, MD  EQUATE STOOL SOFTENER 100 MG capsule Take 100 mg by mouth 2 (two) times daily as needed. 08/21/21  Yes [provider]  escitalopram (LEXAPRO) 20 MG tablet Take 1 tablet (20 mg total) by mouth at bedtime. 01/26/19  Yes Renato Shin, MD  fluticasone [facility]) 50 MCG/ACT nasal spray Place 2 sprays into both nostrils at bedtime.   Yes [provider]  glimepiride (AMARYL) 1 MG tablet Take 1 mg by mouth daily. 01/24/22  Yes [provider]  glucose blood (ACCU-CHEK AVIVA PLUS) test strip 1 each by Other route daily. And lancets 1/day 05/30/21  Yes Renato Shin, MD  ipratropium (ATROVENT) 0.02 % nebulizer solution Take 2.5 mLs (0.5 mg total) by nebulization every 6 (six) hours as needed. 06/24/22  Yes Valentina Shaggy, MD  Magnesium 250 MG TABS Take 250 mg by mouth at bedtime.    Yes [provider]  meclizine (ANTIVERT) 25 MG tablet Take 25 mg by mouth 4 (four) times daily as needed. 10/29/21  Yes [provider]  NON FORMULARY at bedtime. CPAP at night   Yes [provider]  omeprazole (PRILOSEC) 40 MG capsule Take 1 capsule (40 mg total) by mouth 2 (two) times daily before a meal. 05/09/22  Yes Jodi Mourning, Kristen S, PA-C  rosuvastatin (CRESTOR) 10 MG tablet Take 10 mg by mouth once a week. 12/05/21  Yes [provider]  Simethicone 250  MG CAPS Take 500 mg by mouth at bedtime.   Yes [provider]  sucralfate (CARAFATE) 1 GM/10ML suspension TAKE 10 ML BY MOUTH  4 TIMES DAILY AS NEEDED 06/09/22  Yes Harper, Kristen S, PA-C  SYNTHROID 137 MCG tablet Take 137 mcg by mouth daily. 03/14/22  Yes [provider]  APPLE CIDER VINEGAR PO Take by mouth. Patient not taking: Reported on 07/18/2022    [provider]  levalbuterol Penne Lash) 0.63 MG/3ML nebulizer solution Take 3 mLs (0.63 mg total) by nebulization every 4 (four) hours as needed for wheezing or shortness of breath. Patient not taking: Reported on 07/18/2022 06/06/22   Valentina Shaggy, MD    Allergies as of 07/18/2022 - Review Complete 07/18/2022  Allergen Reaction Noted   Levofloxacin Other (See Comments) 03/03/2017   Toradol [ketorolac tromethamine] Anaphylaxis and  Hives 06/08/2019   Tramadol Hives 01/11/2021   Levomenol  03/06/2020   Other Other (See Comments) 12/17/2015   Chamomile Nausea And Vomiting 03/06/2020   Hctz [hydrochlorothiazide] Other (See Comments) 06/22/2020   Lisinopril Nausea Only 01/18/2018   Losartan potassium Other (See Comments) 06/22/2020   Nystatin  04/04/2022   Penicillins Other (See Comments) 04/06/2012   Triamcinolone acetonide  01/02/2021    Family History  Problem Relation Age of Onset   Allergic rhinitis Mother    Uterine cancer Mother        ?cervical cancer   Allergic rhinitis Father    Colon cancer Father 66   Hypertension Father    Diabetes Father    Colon polyps Father        age less than 80   Uterine cancer Sister        suicide   Colon cancer Paternal Aunt 59       also had skin cancer, ovarian?   Depression Other    Thyroid disease Neg Hx    Pancreatic cancer Neg Hx     Social History   Socioeconomic History   Marital status: Married    Spouse name: Not on file   Number of children: Not on file   Years of education: Not on file   Highest education level: Not on file   Occupational History   Not on file  Tobacco Use   Smoking status: Former    Packs/day: 1.00    Years: 5.00    Total pack years: 5.00    Types: Cigarettes    Quit date: 11/03/1997    Years since quitting: 24.7    Passive exposure: Current   Smokeless tobacco: Never   Tobacco comments:    1 pack 1 week  Vaping Use   Vaping Use: Never used  Substance and Sexual Activity   Alcohol use: No    Alcohol/week: 0.0 standard drinks of alcohol   Drug use: No   Sexual activity: Yes    Birth control/protection: Surgical  Other Topics Concern   Not on file  Social History Narrative   Not on file   Social Determinants of Health   Financial Resource Strain: Not on file  Food Insecurity: Not on file  Transportation Needs: Not on file  Physical Activity: Not on file  Stress: Not on file  Social Connections: Not on file  Intimate Partner Violence: Not on file    Review of Systems: See HPI, otherwise negative ROS  Physical Exam: BP (!) 151/95   Pulse 80   Temp 98 F (36.7 C)   Ht '5\' 4"'$  (1.626 m)   Wt 262 lb (118.8 kg)   LMP 10/09/2014   BMI 44.97 kg/m  General:   Alert,  Well-developed, well-nourished, pleasant and cooperative in NAD Neck:  Supple; no masses or thyromegaly. No significant cervical adenopathy. Lungs:  Clear throughout to auscultation.   No wheezes, crackles, or rhonchi. No acute distress. Heart:  Regular rate and rhythm; no murmurs, clicks, rubs,  or gallops. Abdomen: Non-distended, normal bowel sounds.  Soft and nontender without appreciable mass or hepatosplenomegaly.  Pulses:  Normal pulses noted. Extremities:  Without clubbing or edema.  Impression/Plan: 46 year old morbidly obese lady with multiple comorbidities longstanding IBS Its mixed but teasing out her symptoms, has constipation predominant,well managed with simple fiber supplementation.  She does have well demarcated bouts of diarrhea for which she takes Bentyl.  Occasional associated nausea for which  she takes Zofran with difficult improvement.  Immunotherapy did not agree with or with her as far as producing diarrhea.  Reflux symptoms well controlled on twice daily PPI.  Positive family history of colon cancer in a first and second-degree relative.;  Due for colonoscopy at this time.   Recommendations:  She was congratulated on weight loss.  Immunotherapy appears not to agree with you.  Stay away from it.  Continue omeprazole 40 mg twice daily for reflux-take between meals.  (Refill x1 year)  Continue fiber supplement every day most of the time.  Agree with holding off when have bouts of diarrhea.  May use Zofran 4 mg ODT DT up to 4 times a day as needed for nausea (refill x1 year)  May use Bentyl 1 to 2  10 mg tablets before meals and at bedtime as needed for bouts of diarrhea (refill x1 year)  Offered the patient a high risk screening colonoscopy. The risks, benefits, limitations, alternatives and imponderables have been reviewed with the patient. Questions have been answered. All parties are agreeable.    Further recommendations to follow.     Notice: This dictation was prepared with Dragon dictation along with smaller phrase technology. Any transcriptional errors that result from this process are unintentional and may not be corrected upon review.

## 2022-07-24 ENCOUNTER — Telehealth: Payer: Self-pay | Admitting: *Deleted

## 2022-07-24 ENCOUNTER — Encounter (INDEPENDENT_AMBULATORY_CARE_PROVIDER_SITE_OTHER): Payer: Self-pay

## 2022-07-24 NOTE — Patient Outreach (Signed)
  Care Coordination   07/24/2022 Name: Heather Fry MRN: [id number] DOB: [birth year]   Care Coordination Outreach Attempts:  An unsuccessful telephone outreach was attempted today to offer the patient information about available care coordination services as a benefit of their health plan.   Follow Up Plan:  Additional outreach attempts will be made to offer the patient care coordination information and services.   Encounter Outcome:  No Answer  Care Coordination Interventions Activated:  No   Care Coordination Interventions:  No, not indicated    Valente David, RN, MSN, [facility] Care Management Care Management Coordinator [phone]

## 2022-08-02 ENCOUNTER — Other Ambulatory Visit: Payer: Self-pay | Admitting: *Deleted

## 2022-08-02 NOTE — Patient Outreach (Signed)
  Care Coordination   08/02/2022  Name: JAKYRA KENEALY MRN: [id number] DOB: 1976-08-06   Care Coordination Outreach Attempts:  A second unsuccessful outreach was attempted today to offer the patient with information about available care coordination services as a benefit of their health plan.   HIPAA compliant messages left on voicemail, providing contact information for CSW, encouraging patient to return CSW's call at her earliest convenience.  Follow Up Plan:  Additional outreach attempts will be made to offer the patient care coordination information and services.   Encounter Outcome:  No Answer.   Care Coordination Interventions Activated:  No.    Care Coordination Interventions:  No, not indicated.    Nat Christen, BSW, MSW, LCSW  Licensed Education officer, [facility]  Mailing [place] [address], [place], [place] [address] Physical Address-[address], [place],  [address] Toll Free Main # [phone] Fax # [phone] Cell # [phone] [email or web address]

## 2022-08-05 ENCOUNTER — Encounter: Payer: Self-pay | Admitting: *Deleted

## 2022-08-05 ENCOUNTER — Ambulatory Visit: Payer: Self-pay | Admitting: *Deleted

## 2022-08-05 ENCOUNTER — Other Ambulatory Visit: Payer: Self-pay

## 2022-08-05 DIAGNOSIS — K589 Irritable bowel syndrome without diarrhea: Secondary | ICD-10-CM

## 2022-08-05 DIAGNOSIS — R197 Diarrhea, unspecified: Secondary | ICD-10-CM

## 2022-08-05 DIAGNOSIS — K219 Gastro-esophageal reflux disease without esophagitis: Secondary | ICD-10-CM

## 2022-08-05 MED ORDER — OMEPRAZOLE 40 MG PO CPDR: 40.0000 mg | DELAYED_RELEASE_CAPSULE | Freq: Two times a day (BID) | ORAL | 11 refills | Status: DC

## 2022-08-05 MED ORDER — ONDANSETRON 4 MG PO TBDP: 4.0000 mg | ORAL_TABLET | Freq: Three times a day (TID) | ORAL | 11 refills | Status: DC | PRN

## 2022-08-05 MED ORDER — DICYCLOMINE HCL 10 MG PO CAPS: 10.0000 mg | ORAL_CAPSULE | Freq: Three times a day (TID) | ORAL | 11 refills | Status: DC | PRN

## 2022-08-05 NOTE — Patient Instructions (Signed)
Visit Information  Thank you for taking time to visit with me today. Please don't hesitate to contact me if I can be of assistance to you.   Please call the care guide team at [phone] if you need to cancel or reschedule your appointment.   If you are experiencing a Mental Health or Behavioral Health Crisis or need someone to talk to, please call the Suicide and Crisis Lifeline: 988 call the [place] National Suicide Prevention Lifeline: 1-800-273-8255 or TTY: 1-800-799-4 TTY (1-800-799-4889) to talk to a trained counselor call 1-800-273-TALK (toll free, 24 hour hotline) go to [facility] [address], [place] ([phone]) call the [place] Crisis Line: 800-939-9988 call 911  Patient verbalizes understanding of instructions and care plan provided today and agrees to view in MyChart. Active MyChart status and patient understanding of how to access instructions and care plan via MyChart confirmed with patient.     No further follow up required.  Regena Delucchi, BSW, MSW, LCSW  Licensed Clinical Social Worker  [facility] Care Management [facility]  Mailing Address-[address], [place], [place] [address] Physical Address-[address], [place], [place] [address] Toll Free Main # [phone] Fax # [phone] Cell # [phone] [email or web address]            

## 2022-08-05 NOTE — Patient Outreach (Signed)
  Care Coordination   Initial Visit Note   08/05/2022  Name: Heather Fry MRN: [id number] DOB: [birth year]  Heather Fry is a 46 y.o. year old female who sees Redmond School, MD for primary care. I spoke with Heather Fry by phone today.  What matters to the patients health and wellness today?  No Interventions Identified.   SDOH assessments and interventions completed:  Yes.  SDOH Interventions Today    Flowsheet Row Most Recent Value  SDOH Interventions   Food Insecurity Interventions Intervention Not Indicated  Housing Interventions Intervention Not Indicated  Transportation Interventions Intervention Not Indicated  Utilities Interventions Intervention Not Indicated  Alcohol Usage Interventions Intervention Not Indicated (Score <7)  Financial Strain Interventions Intervention Not Indicated  Physical Activity Interventions Patient Refused  Stress Interventions Intervention Not Indicated  Social Connections Interventions Intervention Not Indicated     Care Coordination Interventions Activated:  Yes.   Care Coordination Interventions:  Yes, provided.   Follow up plan: No further intervention required.   Encounter Outcome:  Pt. Visit Completed.   Nat Christen, BSW, MSW, LCSW  Licensed Education officer, [facility]  Mailing [place] [address] [place], [place] [address] Physical Address-[address] [place], [place] [address] Toll Free Main # [phone] Fax # [phone] Cell # [phone] [email or web address]

## 2022-08-07 ENCOUNTER — Other Ambulatory Visit: Payer: Self-pay

## 2022-08-07 DIAGNOSIS — K589 Irritable bowel syndrome without diarrhea: Secondary | ICD-10-CM

## 2022-08-07 DIAGNOSIS — R197 Diarrhea, unspecified: Secondary | ICD-10-CM

## 2022-08-07 MED ORDER — DICYCLOMINE HCL 10 MG PO CAPS: 10.0000 mg | ORAL_CAPSULE | Freq: Three times a day (TID) | ORAL | 11 refills | Status: DC | PRN

## 2022-08-15 ENCOUNTER — Ambulatory Visit: Payer: Medicaid Other | Admitting: Allergy & Immunology

## 2022-08-20 NOTE — Patient Instructions (Signed)
Heather Fry  08/20/2022     '@PREFPERIOPPHARMACY'$ @   Your procedure is scheduled on 08/28/2022.  Report to [facility] at 7:30 A.M.  Call this number if you have problems the morning of surgery:  [phone]  If you experience any cold or flu symptoms such as cough, fever, chills, shortness of breath, etc. between now and your scheduled surgery, please notify us at the above number.   Remember:   Please Follow the diet and prep instructions given to you by Dr Roseanne Kaufman office.    Take these medicines the morning of surgery with A SIP OF WATER : Zyrtec Antivert Prilosec Synthroid   Do not take any diabetic medications the morning of the procedure.    Please use your nebulizer and inhaler before coming to the hospital and bring your rescue inhaler with you.       Do not wear jewelry, make-up or nail polish.  Do not wear lotions, powders, or perfumes, or deodorant.  Do not shave 48 hours prior to surgery.  Men may shave face and neck.  Do not bring valuables to the hospital.  [facility] is not responsible for any belongings or valuables.  Contacts, dentures or bridgework may not be worn into surgery.  Leave your suitcase in the car.  After surgery it may be brought to your room.  For patients admitted to the hospital, discharge time will be determined by your treatment team.  Patients discharged the day of surgery will not be allowed to drive home.   Name and phone number of your driver:   Family Special instructions:  N/A  Please read over the following fact sheets that you were given. Care and Recovery After Surgery Colonoscopy, Adult A colonoscopy is a procedure to look at the entire large intestine. This procedure is done using a long, thin, flexible tube that has a camera on the end. You may have a colonoscopy: As a part of normal colorectal screening. If you have certain symptoms, such as: A low number of red blood cells in your blood (anemia). Diarrhea that  does not go away. Pain in your abdomen. Blood in your stool. A colonoscopy can help screen for and diagnose medical problems, including: An abnormal growth of cells or tissue (tumor). Abnormal growths within the lining of your intestine (polyps). Inflammation. Areas of bleeding. Tell your health care provider about: Any allergies you have. All medicines you are taking, including vitamins, herbs, eye drops, creams, and over-the-counter medicines. Any problems you or family members have had with anesthetic medicines. Any bleeding problems you have. Any surgeries you have had. Any medical conditions you have. Any problems you have had with having bowel movements. Whether you are pregnant or may be pregnant. What are the risks? Generally, this is a safe procedure. However, problems may occur, including: Bleeding. Damage to your intestine. Allergic reactions to medicines given during the procedure. Infection. This is rare. What happens before the procedure? Eating and drinking restrictions Follow instructions from your health care provider about eating or drinking restrictions, which may include: A few days before the procedure: Follow a low-fiber diet. Avoid nuts, seeds, dried fruit, raw fruits, and vegetables. 1-3 days before the procedure: Eat only gelatin dessert or ice pops. Drink only clear liquids, such as water, clear juice, clear broth or bouillon, black coffee or tea, or clear soft drinks or sports drinks. Avoid liquids that contain red or purple dye. The day of the procedure: Do not eat solid foods.  You may continue to drink clear liquids until up to 2 hours before the procedure. Do not eat or drink anything starting 2 hours before the procedure, or within the time period that your health care provider recommends. Bowel prep If you were prescribed a bowel prep to take by mouth (orally) to clean out your colon: Take it as told by your health care provider. Starting the day  before your procedure, you will need to drink a large amount of liquid medicine. The liquid will cause you to have many bowel movements of loose stool until your stool becomes almost clear or light green. If your skin or the opening between the buttocks (anus) gets irritated from diarrhea, you may relieve the irritation using: Wipes with medicine in them, such as adult wet wipes with aloe and vitamin E. A product to soothe skin, such as petroleum jelly. If you vomit while drinking the bowel prep: Take a break for up to 60 minutes. Begin the bowel prep again. Call your health care provider if you keep vomiting or you cannot take the bowel prep without vomiting. To clean out your colon, you may also be given: Laxative medicines. These help you have a bowel movement. Instructions for enema use. An enema is liquid medicine injected into your rectum. Medicines Ask your health care provider about: Changing or stopping your regular medicines or supplements. This is especially important if you are taking iron supplements, diabetes medicines, or blood thinners. Taking medicines such as aspirin and ibuprofen. These medicines can thin your blood. Do not take these medicines unless your health care provider tells you to take them. Taking over-the-counter medicines, vitamins, herbs, and supplements. General instructions Ask your health care provider what steps will be taken to help prevent infection. These may include washing skin with a germ-killing soap. If you will be going home right after the procedure, plan to have a responsible adult: Take you home from the hospital or clinic. You will not be allowed to drive. Care for you for the time you are told. What happens during the procedure?  An IV will be inserted into one of your veins. You will be given a medicine to make you fall asleep (general anesthetic). You will lie on your side with your knees bent. A lubricant will be put on the tube. Then the  tube will be: Inserted into your anus. Gently eased through all parts of your large intestine. Air will be sent into your colon to keep it open. This may cause some pressure or cramping. Images will be taken with the camera and will appear on a screen. A small tissue sample may be removed to be looked at under a microscope (biopsy). The tissue may be sent to a lab for testing if any signs of problems are found. If small polyps are found, they may be removed and checked for cancer cells. When the procedure is finished, the tube will be removed. The procedure may vary among health care providers and hospitals. What happens after the procedure? Your blood pressure, heart rate, breathing rate, and blood oxygen level will be monitored until you leave the hospital or clinic. You may have a small amount of blood in your stool. You may pass gas and have mild cramping or bloating in your abdomen. This is caused by the air that was used to open your colon during the exam. If you were given a sedative during the procedure, it can affect you for several hours. Do not drive or  operate machinery until your health care provider says that it is safe. It is up to you to get the results of your procedure. Ask your health care provider, or the department that is doing the procedure, when your results will be ready. Summary A colonoscopy is a procedure to look at the entire large intestine. Follow instructions from your health care provider about eating and drinking before the procedure. If you were prescribed an oral bowel prep to clean out your colon, take it as told by your health care provider. During the colonoscopy, a flexible tube with a camera on its end is inserted into the anus and then passed into all parts of the large intestine. This information is not intended to replace advice given to you by your health care provider. Make sure you discuss any questions you have with your health care  provider. Document Revised: 09/09/2021 Document Reviewed: 05/08/2021 Elsevier Patient Education  [place] Anesthesia refers to the techniques, procedures, and medicines that help a person stay safe and comfortable during surgery. Monitored anesthesia care, or sedation, is one type of anesthesia. You may have sedation if you do not need to be asleep for your procedure. Procedures that use sedation may include: Surgery to remove cataracts from your eyes. A dental procedure. A biopsy. This is when a tissue sample is removed and looked at under a microscope. You will be watched closely during your procedure. Your level of sedation or type of anesthesia may be changed to fit your needs. Tell a health care provider about: Any allergies you have. All medicines you are taking, including vitamins, herbs, eye drops, creams, and over-the-counter medicines. Any problems you or family members have had with anesthesia. Any bleeding problems you have. Any surgeries you have had. Any medical conditions or illnesses you have. This includes sleep apnea, cough, fever, or the flu. Whether you are pregnant or may be pregnant. Whether you use cigarettes, alcohol, or drugs. Any use of steroids, whether by mouth or as a cream. What are the risks? Your health care provider will talk with you about risks. These may include: Getting too much medicine (oversedation). Nausea. Allergic reactions to medicines. Trouble breathing. If this happens, a breathing tube may be used to help you breathe. It will be removed when you are awake and breathing on your own. Heart trouble. Lung trouble. Confusion that gets better with time (emergence delirium). What happens before the procedure? When to stop eating and drinking Follow instructions from your health care provider about what you may eat and drink. These may include: 8 hours before your procedure Stop eating most foods. Do not eat  meat, fried foods, or fatty foods. Eat only light foods, such as toast or crackers. All liquids are okay except energy drinks and alcohol. 6 hours before your procedure Stop eating. Drink only clear liquids, such as water, clear fruit juice, black coffee, plain tea, and sports drinks. Do not drink energy drinks or alcohol. 2 hours before your procedure Stop drinking all liquids. You may be allowed to take medicines with small sips of water. If you do not follow your health care provider's instructions, your procedure may be delayed or canceled. Medicines Ask your health care provider about: Changing or stopping your regular medicines. These include any diabetes medicines or blood thinners you take. Taking medicines such as aspirin and ibuprofen. These medicines can thin your blood. Do not take them unless your health care provider tells you to. Taking  over-the-counter medicines, vitamins, herbs, and supplements. Testing You may have an exam or testing. You may have a blood or urine sample taken. General instructions Do not use any products that contain nicotine or tobacco for at least 4 weeks before the procedure. These products include cigarettes, chewing tobacco, and vaping devices, such as e-cigarettes. If you need help quitting, ask your health care provider. If you will be going home right after the procedure, plan to have a responsible adult: Take you home from the hospital or clinic. You will not be allowed to drive. Care for you for the time you are told. What happens during the procedure?  Your blood pressure, heart rate, breathing, level of pain, and blood oxygen level will be monitored. An IV will be inserted into one of your veins. You may be given: A sedative. This helps you relax. Anesthesia. This will: Numb certain areas of your body. Make you fall asleep for surgery. You will be given medicines as needed to keep you comfortable. The more medicine you are given, the  deeper your level of sedation will be. Your level of sedation may be changed to fit your needs. There are three levels of sedation: Mild sedation. At this level, you may feel awake and relaxed. You will be able to follow directions. Moderate sedation. At this level, you will be sleepy. You may not remember the procedure. Deep sedation. At this level, you will be asleep. You will not remember the procedure. How you get the medicines will depend on your age and the procedure. They may be given as: A pill. This may be taken by mouth (orally) or inserted into the rectum. An injection. This may be into a vein or muscle. A spray through the nose. After your procedure is over, the medicine will be stopped. The procedure may vary among health care providers and hospitals. What happens after the procedure? Your blood pressure, heart rate, breathing rate, and blood oxygen level will be monitored until you leave the hospital or clinic. You may feel sleepy, clumsy, or nauseous. You may not remember what happened during or after the procedure. Sedation can affect you for several hours. Do not drive or use machinery until your health care provider says that it is safe. This information is not intended to replace advice given to you by your health care provider. Make sure you discuss any questions you have with your health care provider. Document Revised: 02/10/2022 Document Reviewed: 02/10/2022 Elsevier Patient Education  [place].

## 2022-08-25 ENCOUNTER — Encounter (HOSPITAL_COMMUNITY)
Admission: RE | Admit: 2022-08-25 | Discharge: 2022-08-25 | Disposition: A | Discharge status: Home / Self Care | Payer: Medicaid Other | Source: Ambulatory Visit | Attending: Internal Medicine | Admitting: Internal Medicine

## 2022-08-25 ENCOUNTER — Encounter (HOSPITAL_COMMUNITY): Payer: Self-pay

## 2022-08-25 ENCOUNTER — Other Ambulatory Visit: Payer: Self-pay

## 2022-08-25 VITALS — BP 155/91 | HR 97 | Temp 97.7°F | Resp 18 | Ht 64.0 in | Wt 261.9 lb

## 2022-08-25 DIAGNOSIS — E1165 Type 2 diabetes mellitus with hyperglycemia: Secondary | ICD-10-CM | POA: Insufficient documentation

## 2022-08-25 DIAGNOSIS — Z01812 Encounter for preprocedural laboratory examination: Secondary | ICD-10-CM | POA: Diagnosis present

## 2022-08-25 LAB — BASIC METABOLIC PANEL
Anion gap: 9 (ref 5–15)
BUN: 11 mg/dL (ref 6–20)
CO2: 23 mmol/L (ref 22–32)
Calcium: 8.7 mg/dL — ABNORMAL LOW (ref 8.9–10.3)
Chloride: 104 mmol/L (ref 98–111)
Creatinine, Ser: 0.64 mg/dL (ref 0.44–1.00)
GFR, Estimated: >60 mL/min (ref >60)
Glucose, Bld: 180 mg/dL — ABNORMAL HIGH (ref 70–99)
Potassium: 3.8 mmol/L (ref 3.5–5.1)
Sodium: 136 mmol/L (ref 135–145)

## 2022-08-26 ENCOUNTER — Telehealth: Payer: Self-pay | Admitting: *Deleted

## 2022-08-26 NOTE — Telephone Encounter (Signed)
Just FYI:  You are completely booked that day

## 2022-08-26 NOTE — Telephone Encounter (Signed)
Pt called and wanted to add an EGD to the colonoscopy on 08/28/22. She says she has been having a lot of pain in upper abdomen for the past 2 days with nausea. Told pt I would have to discuss this with you before it can be added on. Please advise. Thank you

## 2022-08-27 NOTE — Telephone Encounter (Signed)
Pt informed of md message and instructions. Verbalized understanding.

## 2022-08-28 ENCOUNTER — Ambulatory Visit (HOSPITAL_BASED_OUTPATIENT_CLINIC_OR_DEPARTMENT_OTHER): Payer: Medicaid Other | Admitting: Anesthesiology

## 2022-08-28 ENCOUNTER — Ambulatory Visit (HOSPITAL_COMMUNITY): Payer: Medicaid Other | Admitting: Anesthesiology

## 2022-08-28 ENCOUNTER — Ambulatory Visit (HOSPITAL_COMMUNITY)
Admission: RE | Admit: 2022-08-28 | Discharge: 2022-08-28 | Disposition: A | Discharge status: Home / Self Care | Payer: Medicaid Other | Attending: Internal Medicine | Admitting: Internal Medicine

## 2022-08-28 ENCOUNTER — Encounter (HOSPITAL_COMMUNITY): Payer: Self-pay | Admitting: Internal Medicine

## 2022-08-28 ENCOUNTER — Encounter (HOSPITAL_COMMUNITY)
Admission: RE | Disposition: A | Discharge status: Home / Self Care | Payer: Self-pay | Source: Home / Self Care | Attending: Internal Medicine

## 2022-08-28 DIAGNOSIS — F32A Depression, unspecified: Secondary | ICD-10-CM | POA: Diagnosis not present

## 2022-08-28 DIAGNOSIS — Z800 Family history of malignant neoplasm of digestive organs: Secondary | ICD-10-CM | POA: Insufficient documentation

## 2022-08-28 DIAGNOSIS — J45909 Unspecified asthma, uncomplicated: Secondary | ICD-10-CM | POA: Diagnosis not present

## 2022-08-28 DIAGNOSIS — E119 Type 2 diabetes mellitus without complications: Secondary | ICD-10-CM | POA: Diagnosis not present

## 2022-08-28 DIAGNOSIS — Z79899 Other long term (current) drug therapy: Secondary | ICD-10-CM | POA: Diagnosis not present

## 2022-08-28 DIAGNOSIS — Z87891 Personal history of nicotine dependence: Secondary | ICD-10-CM

## 2022-08-28 DIAGNOSIS — G4730 Sleep apnea, unspecified: Secondary | ICD-10-CM | POA: Diagnosis not present

## 2022-08-28 DIAGNOSIS — Q438 Other specified congenital malformations of intestine: Secondary | ICD-10-CM | POA: Diagnosis not present

## 2022-08-28 DIAGNOSIS — M1990 Unspecified osteoarthritis, unspecified site: Secondary | ICD-10-CM | POA: Diagnosis not present

## 2022-08-28 DIAGNOSIS — Z1211 Encounter for screening for malignant neoplasm of colon: Secondary | ICD-10-CM | POA: Diagnosis present

## 2022-08-28 DIAGNOSIS — F419 Anxiety disorder, unspecified: Secondary | ICD-10-CM | POA: Insufficient documentation

## 2022-08-28 DIAGNOSIS — E039 Hypothyroidism, unspecified: Secondary | ICD-10-CM | POA: Insufficient documentation

## 2022-08-28 DIAGNOSIS — K5730 Diverticulosis of large intestine without perforation or abscess without bleeding: Secondary | ICD-10-CM

## 2022-08-28 DIAGNOSIS — Z1212 Encounter for screening for malignant neoplasm of rectum: Secondary | ICD-10-CM | POA: Diagnosis not present

## 2022-08-28 DIAGNOSIS — I10 Essential (primary) hypertension: Secondary | ICD-10-CM | POA: Diagnosis not present

## 2022-08-28 DIAGNOSIS — K219 Gastro-esophageal reflux disease without esophagitis: Secondary | ICD-10-CM | POA: Insufficient documentation

## 2022-08-28 DIAGNOSIS — Z7984 Long term (current) use of oral hypoglycemic drugs: Secondary | ICD-10-CM | POA: Insufficient documentation

## 2022-08-28 DIAGNOSIS — K582 Mixed irritable bowel syndrome: Secondary | ICD-10-CM | POA: Diagnosis not present

## 2022-08-28 HISTORY — PX: COLONOSCOPY WITH PROPOFOL: SHX5780

## 2022-08-28 LAB — GLUCOSE, CAPILLARY
Glucose-Capillary: 142 mg/dL — ABNORMAL HIGH (ref 70–99)
Glucose-Capillary: 156 mg/dL — ABNORMAL HIGH (ref 70–99)

## 2022-08-28 SURGERY — COLONOSCOPY WITH PROPOFOL
Anesthesia: General

## 2022-08-28 MED ORDER — PROPOFOL 10 MG/ML IV BOLUS
INTRAVENOUS | Status: DC | PRN
  Administered 2022-08-28 (×2): 50 mg via INTRAVENOUS
  Administered 2022-08-28: 20 mg via INTRAVENOUS
  Administered 2022-08-28: 30 mg via INTRAVENOUS
  Administered 2022-08-28: 50 mg via INTRAVENOUS

## 2022-08-28 MED ORDER — LACTATED RINGERS IV SOLN: INTRAVENOUS | Status: DC | PRN

## 2022-08-28 MED ORDER — STERILE WATER FOR IRRIGATION IR SOLN
Status: DC | PRN
  Administered 2022-08-28: 60 mL

## 2022-08-28 MED ORDER — PROPOFOL 500 MG/50ML IV EMUL
INTRAVENOUS | Status: DC | PRN
  Administered 2022-08-28: 150 ug/kg/min via INTRAVENOUS

## 2022-08-28 MED ORDER — LIDOCAINE HCL (CARDIAC) PF 100 MG/5ML IV SOSY
PREFILLED_SYRINGE | INTRAVENOUS | Status: DC | PRN
  Administered 2022-08-28: 50 mg via INTRAVENOUS

## 2022-08-28 NOTE — Anesthesia Preprocedure Evaluation (Signed)
Anesthesia Evaluation  Patient identified by MRN, date of birth, ID band Patient awake    Reviewed: Allergy & Precautions, H&P , NPO status , Patient's Chart, lab work & pertinent test results  Airway Mallampati: III  TM Distance: >3 FB Neck ROM: Full    Dental no notable dental hx. (+) Dental Advisory Given, Missing, Poor Dentition, Chipped   Pulmonary asthma , sleep apnea , pneumonia, former smoker   Pulmonary exam normal breath sounds clear to auscultation       Cardiovascular hypertension, Pt. on medications + DOE  Normal cardiovascular exam Rhythm:Regular Rate:Normal     Neuro/Psych  Headaches PSYCHIATRIC DISORDERS Anxiety Depression     Neuromuscular disease (RLS)    GI/Hepatic Neg liver ROS,GERD  ,,  Endo/Other  diabetes, Well Controlled, Type 2, Oral Hypoglycemic AgentsHypothyroidism    Renal/GU negative Renal ROS  negative genitourinary   Musculoskeletal negative musculoskeletal ROS (+) Arthritis ,    Abdominal   Peds negative pediatric ROS (+)  Hematology  (+) Blood dyscrasia, anemia   Anesthesia Other Findings   Reproductive/Obstetrics negative OB ROS                             Anesthesia Physical Anesthesia Plan  ASA: 3  Anesthesia Plan: General   Post-op Pain Management: Minimal or no pain anticipated   Induction: Intravenous  PONV Risk Score and Plan: 1 and Propofol infusion  Airway Management Planned: Nasal Cannula, Natural Airway and Simple Face Mask  Additional Equipment:   Intra-op Plan:   Post-operative Plan:   Informed Consent: I have reviewed the patients History and Physical, chart, labs and discussed the procedure including the risks, benefits and alternatives for the proposed anesthesia with the patient or authorized representative who has indicated his/her understanding and acceptance.     Dental advisory given  Plan Discussed with: CRNA and  Surgeon  Anesthesia Plan Comments:        Anesthesia Quick Evaluation

## 2022-08-28 NOTE — Anesthesia Postprocedure Evaluation (Signed)
Anesthesia Post Note  Patient: Heather Fry  Procedure(s) Performed: COLONOSCOPY WITH PROPOFOL  Patient location during evaluation: Phase II Anesthesia Type: General Level of consciousness: awake and alert and oriented Pain management: pain level controlled Vital Signs Assessment: post-procedure vital signs reviewed and stable Respiratory status: spontaneous breathing, nonlabored ventilation and respiratory function stable Cardiovascular status: blood pressure returned to baseline and stable Postop Assessment: no apparent nausea or vomiting Anesthetic complications: no  No notable events documented.   Last Vitals:  Vitals:   08/28/22 0751 08/28/22 0938  BP: (!) 142/87 114/63  Pulse: 88 76  Resp: 17 19  Temp: 36.7 C 36.9 C  SpO2: 95% 95%    Last Pain:  Vitals:   08/28/22 0938  TempSrc: Oral  PainSc: Asleep                 Broderic Bara C Latishia Suitt

## 2022-08-28 NOTE — Transfer of Care (Signed)
Immediate Anesthesia Transfer of Care Note  Patient: Heather Fry  Procedure(s) Performed: COLONOSCOPY WITH PROPOFOL  Patient Location: PACU  Anesthesia Type:MAC  Level of Consciousness: drowsy  Airway & Oxygen Therapy: Patient Spontanous Breathing and Patient connected to nasal cannula oxygen  Post-op Assessment: Report given to RN and Post -op Vital signs reviewed and stable  Post vital signs: Reviewed and stable  Last Vitals:  Vitals Value Taken Time  BP    Temp    Pulse    Resp    SpO2      Last Pain:  Vitals:   08/28/22 0910  TempSrc:   PainSc: 0-No pain         Complications: No notable events documented.

## 2022-08-28 NOTE — H&P (Signed)
$'@LOGO'J$ @   Primary Care Physician:  Redmond School, MD Primary Gastroenterologist:  Dr. Gala Romney  Pre-Procedure History & Physical: HPI:  Heather Fry is a 46 y.o. female here for here for high rescreening colonoscopy patient patient has a family history of colon cancer in her father.  Negative colonoscopy 2018.  Only GI symptoms are that of alternating constipation and diarrhea consistent with her diagnosis of irritable bowel syndrome. Past Medical History:  Diagnosis Date   Allergic asthma    Anemia    Anxiety    Arthritis    Depression    Diabetes mellitus without complication (HCC)    Diverticulosis    Fatty liver    moderate to severe   GERD (gastroesophageal reflux disease)    Graves disease    Headache    tension, ocular migraines   Hypothyroidism    Internal hemorrhoids    Pneumonia    as an infant   Sleep apnea    Thyroid disease    Uterine fibroid     Past Surgical History:  Procedure Laterality Date   ABDOMINAL HYSTERECTOMY     Per patient, uterine fibroids.   ABDOMINAL HYSTERECTOMY     CARPAL TUNNEL RELEASE Right 06/11/2021   Procedure: Right Carpel tunnel release;  Surgeon: Vallarie Mare, MD;  Location: [facility];  Service: Neurosurgery;  Laterality: Right;  RM 18   CHOLECYSTECTOMY N/A 11/06/2014   Procedure: LAPAROSCOPIC CHOLECYSTECTOMY;  Surgeon: Jamesetta So, MD;  Location: [facility];  Service: General;  Laterality: N/A;   COLONOSCOPY N/A 03/04/2017   Dr. Gala Romney: Hemorrhoids, diverticulosis, benign polyp without adenomatous changes.  Next colonoscopy 10 years   DILATION AND CURETTAGE OF UTERUS     ESOPHAGOGASTRODUODENOSCOPY N/A 10/02/2014   Dr. Gala Romney: fundic gland polyps, hiatal hernia   ESOPHAGOGASTRODUODENOSCOPY  07/2020   [facility] ; normal esophagus biopsied, benign-appearing gastric polyps biopsied, gastric biopsies obtained to evaluate for H. pylori, normal duodenum.  Pathology with mild chronic gastritis, negative for H. pylori, fundic  gland polyps, esophageal biopsy benign.   MOUTH SURGERY     extraction of teeth   POLYPECTOMY  03/04/2017   Procedure: POLYPECTOMY;  Surgeon: Daneil Dolin, MD;  Location: [facility];  Service: Endoscopy;;  colon   RIGHT OOPHORECTOMY  07/2021   THYROIDECTOMY N/A 09/30/2018   Procedure: TOTAL THYROIDECTOMY;  Surgeon: Armandina Gemma, MD;  Location: [facility];  Service: General;  Laterality: N/A;   TUBAL LIGATION      Prior to Admission medications   Medication Sig Start Date End Date Taking? Authorizing Provider  acarbose (PRECOSE) 100 MG tablet Take 100 mg by mouth daily as needed (blood glucose above 160). 11/01/21  Yes [provider]  acetaminophen (TYLENOL) 500 MG tablet Take 1,000 mg by mouth daily as needed for moderate pain.   Yes [provider]  albuterol (VENTOLIN HFA) 108 (90 Base) MCG/ACT inhaler Inhale 2 puffs into the lungs every 6 (six) hours as needed. 12/13/19  Yes Julian Hy, DO  cetirizine (ZYRTEC) 10 MG tablet Take 10 mg by mouth daily.   Yes [provider]  Cholecalciferol (VITAMIN D3) 125 MCG (5000 UT) CAPS Take 1 capsule (5,000 Units total) by mouth daily. Patient taking differently: Take 10,000 Units by mouth daily. 10/13/19  Yes Renato Shin, MD  EPINEPHrine 0.3 mg/0.3 mL IJ SOAJ injection Inject 0.3 mg into the muscle as needed for anaphylaxis. 07/02/22  Yes Valentina Shaggy, MD  EQUATE STOOL SOFTENER 100 MG capsule  Take 100 mg by mouth 2 (two) times daily as needed for moderate constipation. 08/21/21  Yes [provider]  escitalopram (LEXAPRO) 20 MG tablet Take 1 tablet (20 mg total) by mouth at bedtime. 01/26/19  Yes Renato Shin, MD  fluticasone [facility]) 50 MCG/ACT nasal spray Place 2 sprays into both nostrils at bedtime as needed for allergies.   Yes [provider]  glimepiride (AMARYL) 1 MG tablet Take 1 mg by mouth daily as needed (blood glucose above 200). 01/24/22  Yes [provider]  ipratropium  (ATROVENT) 0.02 % nebulizer solution Take 2.5 mLs (0.5 mg total) by nebulization every 6 (six) hours as needed. 06/24/22  Yes Valentina Shaggy, MD  Magnesium 250 MG TABS Take 250 mg by mouth at bedtime.    Yes [provider]  meclizine (ANTIVERT) 25 MG tablet Take 25 mg by mouth 4 (four) times daily as needed for dizziness. 10/29/21  Yes [provider]  omeprazole (PRILOSEC) 40 MG capsule Take 1 capsule (40 mg total) by mouth 2 (two) times daily before a meal. 08/05/22  Yes Warnie Belair, Cristopher Estimable, MD  ondansetron (ZOFRAN ODT) 4 MG disintegrating tablet Take 1 tablet (4 mg total) by mouth every 8 (eight) hours as needed for nausea or vomiting. 08/05/22  Yes Kaelie Henigan, Cristopher Estimable, MD  rosuvastatin (CRESTOR) 10 MG tablet Take 10 mg by mouth every Saturday. 12/05/21  Yes [provider]  Simethicone 250 MG CAPS Take 500 mg by mouth at bedtime.   Yes [provider]  sucralfate (CARAFATE) 1 GM/10ML suspension TAKE 10 ML BY MOUTH  4 TIMES DAILY AS NEEDED 06/09/22  Yes Erenest Rasher, PA-C  SYNTHROID 137 MCG tablet Take 137 mcg by mouth daily before breakfast. 03/14/22  Yes [provider]  azelastine (ASTELIN) 0.1 % nasal spray 2 sprays per nostril 1-2 times daily. Patient taking differently: Place 1-2 sprays into both nostrils daily as needed for allergies. 06/06/22   Valentina Shaggy, MD  dicyclomine (BENTYL) 10 MG capsule Take 1 capsule (10 mg total) by mouth every 8 (eight) hours as needed for spasms. Take 1 capsule (10 mg total) by mouth up to 3 times daily before meals and at bedtime for abdominal cramping or diarrhea.  Hold in the setting of constipation. 08/07/22   Iola Turri, Cristopher Estimable, MD  glucose blood (ACCU-CHEK AVIVA PLUS) test strip 1 each by Other route daily. And lancets 1/day 05/30/21   Renato Shin, MD  levalbuterol Penne Lash) 0.63 MG/3ML nebulizer solution Take 3 mLs (0.63 mg total) by nebulization every 4 (four) hours as needed for wheezing or shortness of  breath. Patient not taking: Reported on 08/19/2022 06/06/22   Valentina Shaggy, MD  NON FORMULARY at bedtime. CPAP at night    [provider]    Allergies as of 07/18/2022 - Review Complete 07/18/2022  Allergen Reaction Noted   Levofloxacin Other (See Comments) 03/03/2017   Toradol [ketorolac tromethamine] Anaphylaxis and Hives 06/08/2019   Tramadol Hives 01/11/2021   Levomenol  03/06/2020   Other Other (See Comments) 12/17/2015   Chamomile Nausea And Vomiting 03/06/2020   Hctz [hydrochlorothiazide] Other (See Comments) 06/22/2020   Lisinopril Nausea Only 01/18/2018   Losartan potassium Other (See Comments) 06/22/2020   Nystatin  04/04/2022   Penicillins Other (See Comments) 04/06/2012   Triamcinolone acetonide  01/02/2021    Family History  Problem Relation Age of Onset   Allergic rhinitis Mother    Uterine cancer Mother        ?  cervical cancer   Allergic rhinitis Father    Colon cancer Father 62   Hypertension Father    Diabetes Father    Colon polyps Father        age less than 58   Uterine cancer Sister        suicide   Colon cancer Paternal Aunt 43       also had skin cancer, ovarian?   Depression Other    Thyroid disease Neg Hx    Pancreatic cancer Neg Hx     Social History   Socioeconomic History   Marital status: Married    Spouse name: Angelys Yetman   Number of children: Not on file   Years of education: 12   Highest education level: 12th grade  Occupational History   Not on file  Tobacco Use   Smoking status: Former    Packs/day: 1.00    Years: 5.00    Total pack years: 5.00    Types: Cigarettes    Quit date: 11/03/1997    Years since quitting: 24.8    Passive exposure: Current   Smokeless tobacco: Never   Tobacco comments:    1 pack 1 week    Smoking Cessation Offered  Vaping Use   Vaping Use: Never used  Substance and Sexual Activity   Alcohol use: No    Alcohol/week: 0.0 standard drinks of alcohol   Drug use: No   Sexual  activity: Yes    Partners: Male    Birth control/protection: Surgical  Other Topics Concern   Not on file  Social History Narrative   Not on file   Social Determinants of Health   Financial Resource Strain: Low Risk  (08/05/2022)   Overall Financial Resource Strain (CARDIA)    Difficulty of Paying Living Expenses: Not hard at all  Food Insecurity: No Food Insecurity (08/05/2022)   Hunger Vital Sign    Worried About Running Out of Food in the Last Year: Never true    Ran Out of Food in the Last Year: Never true  Transportation Needs: No Transportation Needs (08/05/2022)   PRAPARE - Hydrologist (Medical): No    Lack of Transportation (Non-Medical): No  Physical Activity: Inactive (08/05/2022)   Exercise Vital Sign    Days of Exercise per Week: 0 days    Minutes of Exercise per Session: 0 min  Stress: No Stress Concern Present (08/05/2022)   [place]    Feeling of Stress : Only a little  Social Connections: Socially Integrated (08/05/2022)   Social Connection and Isolation Panel [NHANES]    Frequency of Communication with Friends and Family: More than three times a week    Frequency of Social Gatherings with Friends and Family: More than three times a week    Attends Religious Services: More than 4 times per year    Active Member of [employer] or Organizations: Yes    Attends Music therapist: More than 4 times per year    Marital Status: Married  Human resources officer Violence: Not At Risk (08/05/2022)   Humiliation, Afraid, Rape, and Kick questionnaire    Fear of Current or Ex-Partner: No    Emotionally Abused: No    Physically Abused: No    Sexually Abused: No    Review of Systems: See HPI, otherwise negative ROS  Physical Exam: BP (!) 142/87   Pulse 88   Temp 98 F (36.7 C) (Oral)  Resp 17   Ht '5\' 4"'$  (1.626 m)   Wt 118.8 kg   LMP 10/09/2014   SpO2 95%   BMI 44.96  kg/m  General:   Alert,  Well-developed, well-nourished, pleasant and cooperative in NAD Neck:  Supple; no masses or thyromegaly. No significant cervical adenopathy. Lungs:  Clear throughout to auscultation.   No wheezes, crackles, or rhonchi. No acute distress. Heart:  Regular rate and rhythm; no murmurs, clicks, rubs,  or gallops. Abdomen: Non-distended, normal bowel sounds.  Soft and nontender without appreciable mass or hepatosplenomegaly.  Pulses:  Normal pulses noted. Extremities:  Without clubbing or edema.  Impression/Plan: 46 year old lady here for a high risk screening colonoscopy.  Colon cancer in a first-degree relative.  Negative colonoscopy 2019  I have offered the patient a high risk screening colonoscopy today. The risks, benefits, limitations, alternatives and imponderables have been reviewed with the patient. Questions have been answered. All parties are agreeable.       Notice: This dictation was prepared with Dragon dictation along with smaller phrase technology. Any transcriptional errors that result from this process are unintentional and may not be corrected upon review.

## 2022-08-28 NOTE — Discharge Instructions (Signed)
  Colonoscopy Discharge Instructions  Read the instructions outlined below and refer to this sheet in the next few weeks. These discharge instructions provide you with general information on caring for yourself after you leave the hospital. Your doctor may also give you specific instructions. While your treatment has been planned according to the most current medical practices available, unavoidable complications occasionally occur. If you have any problems or questions after discharge, call Dr. Gala Romney at [phone]. ACTIVITY You may resume your regular activity, but move at a slower pace for the next 24 hours.  Take frequent rest periods for the next 24 hours.  Walking will help get rid of the air and reduce the bloated feeling in your belly (abdomen).  No driving for 24 hours (because of the medicine (anesthesia) used during the test).   Do not sign any important legal documents or operate any machinery for 24 hours (because of the anesthesia used during the test).  NUTRITION Drink plenty of fluids.  You may resume your normal diet as instructed by your doctor.  Begin with a light meal and progress to your normal diet. Heavy or fried foods are harder to digest and may make you feel sick to your stomach (nauseated).  Avoid alcoholic beverages for 24 hours or as instructed.  MEDICATIONS You may resume your normal medications unless your doctor tells you otherwise.  WHAT YOU CAN EXPECT TODAY Some feelings of bloating in the abdomen.  Passage of more gas than usual.  Spotting of blood in your stool or on the toilet paper.  IF YOU HAD POLYPS REMOVED DURING THE COLONOSCOPY: No aspirin products for 7 days or as instructed.  No alcohol for 7 days or as instructed.  Eat a soft diet for the next 24 hours.  FINDING OUT THE RESULTS OF YOUR TEST Not all test results are available during your visit. If your test results are not back during the visit, make an appointment with your caregiver to find out the  results. Do not assume everything is normal if you have not heard from your caregiver or the medical facility. It is important for you to follow up on all of your test results.  SEEK IMMEDIATE MEDICAL ATTENTION IF: You have more than a spotting of blood in your stool.  Your belly is swollen (abdominal distention).  You are nauseated or vomiting.  You have a temperature over 101.  You have abdominal pain or discomfort that is severe or gets worse throughout the day.    No colon polyps found today  You do have diverticulosis.  Not diverticulitis.    Informational diverticulosis provided   recommend a repeat colonoscopy for screening in 5 years   at patient request,   I called Chrissie Noa at [phone] findings and recommendations   patient has upper GI concerns, will plan to see her back in the office in 4 to 6 weeks to reassess.

## 2022-08-28 NOTE — Op Note (Signed)
[facility] Patient Name: Heather Fry Procedure Date: 08/28/2022 8:38 AM MRN: [id number] Date of Birth: 06-Jun-1976 Attending MD: Norvel Richards , MD, [id number] CSN: [id number] Age: 46 Admit Type: Outpatient Procedure:                Colonoscopy Indications:              Screening in patient at increased risk: Family                            history of 1st-degree relative with colorectal                            cancer Providers:                Norvel Richards, MD, Caprice Kluver, Aram Candela Referring MD:              Medicines:                Propofol per Anesthesia Complications:            No immediate complications. Estimated Blood Loss:     Estimated blood loss: none. Procedure:                Pre-Anesthesia Assessment:                           - Prior to the procedure, a History and Physical                            was performed, and patient medications and                            allergies were reviewed. The patient's tolerance of                            previous anesthesia was also reviewed. The risks                            and benefits of the procedure and the sedation                            options and risks were discussed with the patient.                            All questions were answered, and informed consent                            was obtained. Prior Anticoagulants: The patient has                            taken no anticoagulant or antiplatelet agents. ASA                            Grade Assessment: IV - A patient with severe  systemic disease that is a constant threat to life.                            After reviewing the risks and benefits, the patient                            was deemed in satisfactory condition to undergo the                            procedure.                           After obtaining informed consent, the colonoscope                            was passed under direct  vision. Throughout the                            procedure, the patient's blood pressure, pulse, and                            oxygen saturations were monitored continuously. The                            [id number]) scope was introduced through the                            anus and advanced to the the cecum, identified by                            appendiceal orifice and ileocecal valve. The                            colonoscopy was performed without difficulty. The                            patient tolerated the procedure well. The quality                            of the bowel preparation was adequate. The                            ileocecal valve, appendiceal orifice, and rectum                            were photographed. The entire colon was well                            visualized. Scope In: 9:14:49 AM Scope Out: 9:32:49 AM Scope Withdrawal Time: 0 hours 10 minutes 39 seconds  Total Procedure Duration: 0 hours 18 minutes 0 seconds  Findings:      The perianal and digital rectal examinations were normal. Redundant       colon requiring external abdominal pressure to reach the cecum.  Scattered medium-mouthed diverticula were found in the entire colon.      The exam was otherwise without abnormality on direct and retroflexion       views. Impression:               - Diverticulosis in the entire examined colon.                            Redundant colon.                           - The examination was otherwise normal on direct                            and retroflexion views.                           - No specimens collected. Moderate Sedation:      Moderate (conscious) sedation was personally administered by an       anesthesia professional. The following parameters were monitored: oxygen       saturation, heart rate, blood pressure, respiratory rate, EKG, adequacy       of pulmonary ventilation, and response to care. Recommendation:           - Patient  has a contact number available for                            emergencies. The signs and symptoms of potential                            delayed complications were discussed with the                            patient. Return to normal activities tomorrow.                            Written discharge instructions were provided to the                            patient.                           - Advance diet as tolerated.                           - Continue present medications.                           - Repeat colonoscopy in 5 years for screening                            purposes.                           - Return to GI office in 4 weeks. Procedure Code(s):        --- Professional ---  45378, Colonoscopy, flexible; diagnostic, including                            collection of specimen(s) by brushing or washing,                            when performed (separate procedure) Diagnosis Code(s):        --- Professional ---                           Z80.0, Family history of malignant neoplasm of                            digestive organs                           K57.30, Diverticulosis of large intestine without                            perforation or abscess without bleeding CPT copyright 2022 American Medical Association. All rights reserved. The codes documented in this report are preliminary and upon coder review may  be revised to meet current compliance requirements. Cristopher Estimable. Pheng Prokop, MD Norvel Richards, MD 08/28/2022 9:45:54 AM This report has been signed electronically. Number of Addenda: 0

## 2022-08-31 ENCOUNTER — Encounter: Payer: Self-pay | Admitting: Cardiology

## 2022-08-31 NOTE — Progress Notes (Unsigned)
Cardiology Office Note   Date:  09/02/2022   ID:  Maie, Kesinger Feb 01, 1976, MRN [id number]  PCP:  Redmond School, MD  Cardiologist:   None Referring:  Redmond School, MD  Chief Complaint  Patient presents with   Chest Pain      History of Present Illness: Heather Fry is a 46 y.o. female who presents for evaluation of chest pain.  She was in the ED in Nov  2022 for this.  She had a  Lexiscan Myoview on 10/13/2019, which showed normal perfusion, LVEF 63%.  TTE on 10/19/2019 showed normal LVEF, normal RV function, no significant valvular disease.  She saw Dr. Gardiner Rhyme at that time for evaluation of chest pain.    I saw her earlier this year and she moved up her appointment for yearly follow-up because she has been having some chest discomfort.  She said this is been happening with what she thinks is an asthma flare.  She will get a tightness and short of breath.  She has to take her inhalers more frequently.  She said it will improve and she will have good days.  Recently she has been able to walk 3 miles and get her heart rate up and not have this chest discomfort.  However, she thinks when she has exposure to things like mold spores her asthma flares up and she will get short of breath.  This causes some tightness in her chest.  She also has some tightness associated with movement and she thinks it happens when she has not brought home.  This is superficial pulling discomfort.  She is not describing classic substernal discomfort with any radiation.  She is not having any nausea vomiting or diaphoresis.  There is probably similar to symptoms she had when she had her stress test previously.   Past Medical History:  Diagnosis Date   Allergic asthma    Anemia    Anxiety    Arthritis    Depression    Diabetes mellitus without complication ([place])    Diverticulosis    Fatty liver    moderate to severe   GERD (gastroesophageal reflux disease)    Graves disease    Headache     tension, ocular migraines   Hypothyroidism    Internal hemorrhoids    Sleep apnea    Thyroid disease    Uterine fibroid     Past Surgical History:  Procedure Laterality Date   ABDOMINAL HYSTERECTOMY     Per patient, uterine fibroids.   ABDOMINAL HYSTERECTOMY     CARPAL TUNNEL RELEASE Right 06/11/2021   Procedure: Right Carpel tunnel release;  Surgeon: Vallarie Mare, MD;  Location: [place];  Service: Neurosurgery;  Laterality: Right;  RM 18   CHOLECYSTECTOMY N/A 11/06/2014   Procedure: LAPAROSCOPIC CHOLECYSTECTOMY;  Surgeon: Jamesetta So, MD;  [facility];  Service: General;  Laterality: N/A;   COLONOSCOPY N/A 03/04/2017   Dr. Gala Romney: Hemorrhoids, diverticulosis, benign polyp without adenomatous changes.  Next colonoscopy 10 years   COLONOSCOPY WITH PROPOFOL N/A 08/28/2022   Procedure: COLONOSCOPY WITH PROPOFOL;  Surgeon: Daneil Dolin, MD;  [facility];  Service: Endoscopy;  Laterality: N/A;  9:00 am   DILATION AND CURETTAGE OF UTERUS     ESOPHAGOGASTRODUODENOSCOPY N/A 10/02/2014   Dr. Gala Romney: fundic gland polyps, hiatal hernia   ESOPHAGOGASTRODUODENOSCOPY  07/2020   [facility] ; normal esophagus biopsied, benign-appearing gastric polyps biopsied, gastric biopsies obtained to evaluate for  H. pylori, normal duodenum.  Pathology with mild chronic gastritis, negative for H. pylori, fundic gland polyps, esophageal biopsy benign.   MOUTH SURGERY     extraction of teeth   POLYPECTOMY  03/04/2017   Procedure: POLYPECTOMY;  Surgeon: Daneil Dolin, MD;  [facility];  Service: Endoscopy;;  colon   RIGHT OOPHORECTOMY  07/2021   THYROIDECTOMY N/A 09/30/2018   Procedure: TOTAL THYROIDECTOMY;  Surgeon: Armandina Gemma, MD;  [facility]: [facility];  Service: General;  Laterality: N/A;   TUBAL LIGATION       Current Outpatient Medications  Medication Sig Dispense Refill   acarbose (PRECOSE) 100 MG tablet Take 100 mg by mouth daily as needed (blood glucose  above 160).     acetaminophen (TYLENOL) 500 MG tablet Take 1,000 mg by mouth daily as needed for moderate pain.     albuterol (VENTOLIN HFA) 108 (90 Base) MCG/ACT inhaler Inhale 2 puffs into the lungs every 6 (six) hours as needed. 18 g 11   azelastine (ASTELIN) 0.1 % nasal spray 2 sprays per nostril 1-2 times daily. (Patient taking differently: Place 1-2 sprays into both nostrils daily as needed for allergies.) 30 mL 5   cetirizine (ZYRTEC) 10 MG tablet Take 10 mg by mouth daily.     Cholecalciferol (VITAMIN D3) 125 MCG (5000 UT) CAPS Take 1 capsule (5,000 Units total) by mouth daily. (Patient taking differently: Take 10,000 Units by mouth daily.) 30 capsule 11   dicyclomine (BENTYL) 10 MG capsule Take 1 capsule (10 mg total) by mouth every 8 (eight) hours as needed for spasms. Take 1 capsule (10 mg total) by mouth up to 3 times daily before meals and at bedtime for abdominal cramping or diarrhea.  Hold in the setting of constipation. 60 capsule 11   EPINEPHrine 0.3 mg/0.3 mL IJ SOAJ injection Inject 0.3 mg into the muscle as needed for anaphylaxis. 2 each 1   EQUATE STOOL SOFTENER 100 MG capsule Take 100 mg by mouth 2 (two) times daily as needed for moderate constipation.     escitalopram (LEXAPRO) 20 MG tablet Take 1 tablet (20 mg total) by mouth at bedtime. 30 tablet 5   fluticasone (FLONASE) 50 MCG/ACT nasal spray Place 2 sprays into both nostrils at bedtime as needed for allergies.     glimepiride (AMARYL) 1 MG tablet Take 1 mg by mouth daily as needed (blood glucose above 200).     glucose blood (ACCU-CHEK AVIVA PLUS) test strip 1 each by Other route daily. And lancets 1/day 100 each 3   ipratropium (ATROVENT) 0.02 % nebulizer solution Take 2.5 mLs (0.5 mg total) by nebulization every 6 (six) hours as needed. 150 mL 2   levalbuterol (XOPENEX) 0.63 MG/3ML nebulizer solution Take 3 mLs (0.63 mg total) by nebulization every 4 (four) hours as needed for wheezing or shortness of breath. 75 mL 1    Magnesium 250 MG TABS Take 250 mg by mouth at bedtime.      meclizine (ANTIVERT) 25 MG tablet Take 25 mg by mouth 4 (four) times daily as needed for dizziness.     NON FORMULARY at bedtime. CPAP at night     omeprazole (PRILOSEC) 40 MG capsule Take 1 capsule (40 mg total) by mouth 2 (two) times daily before a meal. 60 capsule 11   ondansetron (ZOFRAN ODT) 4 MG disintegrating tablet Take 1 tablet (4 mg total) by mouth every 8 (eight) hours as needed for nausea or vomiting. 40 tablet 11   rosuvastatin (  CRESTOR) 10 MG tablet Take 10 mg by mouth every Saturday.     Simethicone 250 MG CAPS Take 500 mg by mouth at bedtime.     sucralfate (CARAFATE) 1 GM/10ML suspension TAKE 10 ML BY MOUTH  4 TIMES DAILY AS NEEDED 828 mL 0   SYNTHROID 137 MCG tablet Take 137 mcg by mouth daily before breakfast.     No current facility-administered medications for this visit.    Allergies:   Levofloxacin, Toradol [ketorolac tromethamine], Tramadol, Levomenol, Other, Chamomile, Hctz [hydrochlorothiazide], Lisinopril, Losartan potassium, Nystatin, Penicillins, and Triamcinolone acetonide    ROS:  Please see the history of present illness.   Otherwise, review of systems are positive for none.   All other systems are reviewed and negative.    PHYSICAL EXAM: VS:  BP (!) 134/90 (BP Location: Left Arm, Patient Position: Sitting, Cuff Size: Large)   Pulse 87   Ht '5\' 4"'$  (1.626 m)   Wt 266 lb (120.7 kg)   LMP 10/09/2014   SpO2 98%   BMI 45.66 kg/m  , BMI Body mass index is 45.66 kg/m. GENERAL:  Well appearing NECK:  No jugular venous distention, waveform within normal limits, carotid upstroke brisk and symmetric, no bruits, no thyromegaly LUNGS:  Clear to auscultation bilaterally CHEST:  Unremarkable HEART:  PMI not displaced or sustained,S1 and S2 within normal limits, no S3, no S4, no clicks, no rubs, no murmurs ABD:  Flat, positive bowel sounds normal in frequency in pitch, no bruits, no rebound, no guarding, no  midline pulsatile mass, no hepatomegaly, no splenomegaly EXT:  2 plus pulses throughout, no edema, no cyanosis no clubbing   EKG:  EKG is  ordered today. The ekg ordered today demonstrates sinus rhythm, rate 87, axis within normal limits, QTc prolonged, no acute ST-T wave changes.   Recent Labs: 10/04/2021: TSH 1.42 04/05/2022: ALT 25; Magnesium 2.1 06/06/2022: Hemoglobin 12.9; Platelets 294 08/25/2022: BUN 11; Creatinine, Ser 0.64; Potassium 3.8; Sodium 136    Lipid Panel    Component Value Date/Time   CHOL 222 (H) 01/20/2020 0417   TRIG 306 (H) 01/20/2020 0417   HDL 55 01/20/2020 0417   CHOLHDL 4.0 01/20/2020 0417   VLDL 61 (H) 01/20/2020 0417   LDLCALC 106 (H) 01/20/2020 0417   LDLDIRECT 135.0 06/29/2019 1458      Wt Readings from Last 3 Encounters:  09/02/22 266 lb (120.7 kg)  08/28/22 261 lb 14.5 oz (118.8 kg)  08/25/22 261 lb 14.5 oz (118.8 kg)      Other studies Reviewed: Additional studies/ records that were reviewed today include: None Review of the above records demonstrates:    ASSESSMENT AND PLAN:   HTN: Patient's blood pressure is controlled.  No change in therapy.  She says it is well-controlled at home.   Chest pain: Her chest discomfort has nonanginal greater than anginal features.  She has an alternative diagnosis as this is typical for her asthma flares.  It is not necessarily different than the symptoms she had when she had a negative stress test.  At this point I would not suggest change in therapy.   DOE: This is likely asthma.  I would like to check a BNP.   Sleep apnea: She uses CPAP.    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  We discussed this and she will avoid any further QT prolonging drugs.  Current medicines are reviewed at length with the patient today.  The patient  does not have concerns regarding medicines.  The following changes have been made:  None  Labs/ tests ordered today include:    Orders Placed This Encounter  Procedures   Basic Metabolic Panel (BMET)   EKG 12-Lead     Disposition:   FU with APP in six months.    Signed, Minus Breeding, MD  09/02/2022 6:04 PM    [place]

## 2022-09-02 ENCOUNTER — Encounter: Payer: Self-pay | Admitting: Cardiology

## 2022-09-02 ENCOUNTER — Ambulatory Visit: Payer: Medicaid Other | Attending: Cardiology | Admitting: Cardiology

## 2022-09-02 VITALS — BP 134/90 | HR 87 | Ht 64.0 in | Wt 266.0 lb

## 2022-09-02 DIAGNOSIS — R072 Precordial pain: Secondary | ICD-10-CM

## 2022-09-02 DIAGNOSIS — I10 Essential (primary) hypertension: Secondary | ICD-10-CM

## 2022-09-02 NOTE — Patient Instructions (Signed)
    Follow-Up: At [facility], you and your health needs are our priority.  As part of our continuing mission to provide you with exceptional heart care, we have created designated Provider Care Teams.  These Care Teams include your primary Cardiologist (physician) and Advanced Practice Providers (APPs -  Physician Assistants and Nurse Practitioners) who all work together to provide you with the care you need, when you need it.  We recommend signing up for the patient portal called "MyChart".  Sign up information is provided on this After Visit Summary.  MyChart is used to connect with patients for Virtual Visits (Telemedicine).  Patients are able to view lab/test results, encounter notes, upcoming appointments, etc.  Non-urgent messages can be sent to your provider as well.   To learn more about what you can do with MyChart, go to [email or web address].    Your next appointment:   6 month(s)  The format for your next appointment:   In Person  Provider:   Any APP

## 2022-09-03 LAB — BASIC METABOLIC PANEL
BUN/Creatinine Ratio: 11 (ref 9–23)
BUN: 8 mg/dL (ref 6–24)
CO2: 22 mmol/L (ref 20–29)
Calcium: 9.3 mg/dL (ref 8.7–10.2)
Chloride: 103 mmol/L (ref 96–106)
Creatinine, Ser: 0.72 mg/dL (ref 0.57–1.00)
Glucose: 145 mg/dL — ABNORMAL HIGH (ref 70–99)
Potassium: 4.2 mmol/L (ref 3.5–5.2)
Sodium: 142 mmol/L (ref 134–144)
eGFR: 104 mL/min/{1.73_m2} (ref >59)

## 2022-09-30 ENCOUNTER — Encounter: Payer: Self-pay | Admitting: Internal Medicine

## 2022-09-30 ENCOUNTER — Ambulatory Visit (INDEPENDENT_AMBULATORY_CARE_PROVIDER_SITE_OTHER): Payer: Medicaid Other | Admitting: Internal Medicine

## 2022-09-30 VITALS — BP 147/91 | HR 91 | Temp 99.0°F | Ht 64.0 in | Wt 267.2 lb

## 2022-09-30 DIAGNOSIS — K219 Gastro-esophageal reflux disease without esophagitis: Secondary | ICD-10-CM | POA: Diagnosis not present

## 2022-09-30 DIAGNOSIS — K589 Irritable bowel syndrome without diarrhea: Secondary | ICD-10-CM | POA: Diagnosis not present

## 2022-09-30 NOTE — Progress Notes (Signed)
Primary Care Physician:  Redmond School, MD Primary Gastroenterologist:  Dr. Gala Romney  Pre-Procedure History & Physical: HPI:  Heather Fry is a 47 y.o. female here for follow-up of mixed IBS-more recently frequent bouts of diarrhea.  Colonoscopy in November demonstrated only diverticulosis (positive family history colon cancer; due for surveillance 5 years) Well-controlled omeprazole 40 mg daily.  No dysphagia. She started taking fiber 2 capsules daily and wants to increase to 4 daily as she feels it helps significantly.  Has not been using Bentyl very much.  Feels fiber supplement is beneficial.  Past Medical History:  Diagnosis Date   Allergic asthma    Anemia    Anxiety    Arthritis    Depression    Diabetes mellitus without complication ([facility])    Diverticulosis    Fatty liver    moderate to severe   GERD (gastroesophageal reflux disease)    Graves disease    Headache    tension, ocular migraines   Hypothyroidism    Internal hemorrhoids    Sleep apnea    Thyroid disease    Uterine fibroid     Past Surgical History:  Procedure Laterality Date   ABDOMINAL HYSTERECTOMY     Per patient, uterine fibroids.   ABDOMINAL HYSTERECTOMY     CARPAL TUNNEL RELEASE Right 06/11/2021   Procedure: Right Carpel tunnel release;  Surgeon: Vallarie Mare, MD;  Location: [place];  Service: Neurosurgery;  Laterality: Right;  RM 18   CHOLECYSTECTOMY N/A 11/06/2014   Procedure: LAPAROSCOPIC CHOLECYSTECTOMY;  Surgeon: Jamesetta So, MD;  [facility];  Service: General;  Laterality: N/A;   COLONOSCOPY N/A 03/04/2017   Dr. Gala Romney: Hemorrhoids, diverticulosis, benign polyp without adenomatous changes.  Next colonoscopy 10 years   COLONOSCOPY WITH PROPOFOL N/A 08/28/2022   Procedure: COLONOSCOPY WITH PROPOFOL;  Surgeon: Daneil Dolin, MD;  [facility];  Service: Endoscopy;  Laterality: N/A;  9:00 am   DILATION AND CURETTAGE OF UTERUS     ESOPHAGOGASTRODUODENOSCOPY N/A  10/02/2014   Dr. Gala Romney: fundic gland polyps, hiatal hernia   ESOPHAGOGASTRODUODENOSCOPY  07/2020   [facility] ; normal esophagus biopsied, benign-appearing gastric polyps biopsied, gastric biopsies obtained to evaluate for H. pylori, normal duodenum.  Pathology with mild chronic gastritis, negative for H. pylori, fundic gland polyps, esophageal biopsy benign.   MOUTH SURGERY     extraction of teeth   POLYPECTOMY  03/04/2017   Procedure: POLYPECTOMY;  Surgeon: Daneil Dolin, MD;  [facility];  Service: Endoscopy;;  colon   RIGHT OOPHORECTOMY  07/2021   THYROIDECTOMY N/A 09/30/2018   Procedure: TOTAL THYROIDECTOMY;  Surgeon: Armandina Gemma, MD;  [facility];  Service: General;  Laterality: N/A;   TUBAL LIGATION      Prior to Admission medications   Medication Sig Start Date End Date Taking? Authorizing Provider  acarbose (PRECOSE) 100 MG tablet Take 100 mg by mouth daily as needed (blood glucose above 160). 11/01/21  Yes [provider]  acetaminophen (TYLENOL) 500 MG tablet Take 1,000 mg by mouth daily as needed for moderate pain.   Yes [provider]  albuterol (VENTOLIN HFA) 108 (90 Base) MCG/ACT inhaler Inhale 2 puffs into the lungs every 6 (six) hours as needed. 12/13/19  Yes Noemi Chapel P, DO  azelastine (ASTELIN) 0.1 % nasal spray 2 sprays per nostril 1-2 times daily. Patient taking differently: Place 1-2 sprays into both nostrils daily as needed for allergies. 06/06/22  Yes  Valentina Shaggy, MD  cetirizine (ZYRTEC) 10 MG tablet Take 10 mg by mouth daily.   Yes [provider]  Cholecalciferol (VITAMIN D3) 125 MCG (5000 UT) CAPS Take 1 capsule (5,000 Units total) by mouth daily. Patient taking differently: Take 10,000 Units by mouth daily. 10/13/19  Yes Renato Shin, MD  Continuous Blood Gluc Sensor (DEXCOM G7 SENSOR) MISC USE AS DIRECTED EVERY 10 DAYS 09/26/22  Yes [provider]  dicyclomine (BENTYL) 10 MG capsule Take 1  capsule (10 mg total) by mouth every 8 (eight) hours as needed for spasms. Take 1 capsule (10 mg total) by mouth up to 3 times daily before meals and at bedtime for abdominal cramping or diarrhea.  Hold in the setting of constipation. 08/07/22  Yes Rexine Gowens, Cristopher Estimable, MD  EPINEPHrine 0.3 mg/0.3 mL IJ SOAJ injection Inject 0.3 mg into the muscle as needed for anaphylaxis. 07/02/22  Yes Valentina Shaggy, MD  EQUATE STOOL SOFTENER 100 MG capsule Take 100 mg by mouth 2 (two) times daily as needed for moderate constipation. 08/21/21  Yes [provider]  escitalopram (LEXAPRO) 20 MG tablet Take 1 tablet (20 mg total) by mouth at bedtime. 01/26/19  Yes Renato Shin, MD  fluticasone [facility]) 50 MCG/ACT nasal spray Place 2 sprays into both nostrils at bedtime as needed for allergies.   Yes [provider]  glimepiride (AMARYL) 1 MG tablet Take 1 mg by mouth daily as needed (blood glucose above 200). 01/24/22  Yes [provider]  glucose blood (ACCU-CHEK AVIVA PLUS) test strip 1 each by Other route daily. And lancets 1/day 05/30/21  Yes Renato Shin, MD  ipratropium (ATROVENT) 0.02 % nebulizer solution Take 2.5 mLs (0.5 mg total) by nebulization every 6 (six) hours as needed. 06/24/22  Yes Valentina Shaggy, MD  Magnesium 250 MG TABS Take 250 mg by mouth at bedtime.    Yes [provider]  meclizine (ANTIVERT) 25 MG tablet Take 25 mg by mouth 4 (four) times daily as needed for dizziness. 10/29/21  Yes [provider]  NON FORMULARY at bedtime. CPAP at night   Yes [provider]  omeprazole (PRILOSEC) 40 MG capsule Take 1 capsule (40 mg total) by mouth 2 (two) times daily before a meal. 08/05/22  Yes Zailah Zagami, Cristopher Estimable, MD  ondansetron (ZOFRAN ODT) 4 MG disintegrating tablet Take 1 tablet (4 mg total) by mouth every 8 (eight) hours as needed for nausea or vomiting. 08/05/22  Yes Lilybeth Vien, Cristopher Estimable, MD  rosuvastatin (CRESTOR) 10 MG tablet Take 10 mg by mouth every  Saturday. 12/05/21  Yes [provider]  Simethicone 250 MG CAPS Take 500 mg by mouth at bedtime.   Yes [provider]  sucralfate (CARAFATE) 1 GM/10ML suspension TAKE 10 ML BY MOUTH  4 TIMES DAILY AS NEEDED 06/09/22  Yes Erenest Rasher, PA-C  SYNTHROID 137 MCG tablet Take 137 mcg by mouth daily before breakfast. 03/14/22  Yes [provider]    Allergies as of 09/30/2022 - Review Complete 09/30/2022  Allergen Reaction Noted   Levofloxacin Other (See Comments) 03/03/2017   Toradol [ketorolac tromethamine] Anaphylaxis and Hives 06/08/2019   Tramadol Hives 01/11/2021   Levomenol  03/06/2020   Other Other (See Comments) 12/17/2015   Chamomile Nausea And Vomiting 03/06/2020   Hctz [hydrochlorothiazide] Other (See Comments) 06/22/2020   Lisinopril Nausea Only 01/18/2018   Losartan potassium Other (See Comments) 06/22/2020   Nystatin  04/04/2022   Penicillins Other (See Comments) 04/06/2012  Triamcinolone acetonide  01/02/2021    Family History  Problem Relation Age of Onset   Allergic rhinitis Mother    Uterine cancer Mother        ?cervical cancer   Allergic rhinitis Father    Colon cancer Father 81   Hypertension Father    Diabetes Father    Colon polyps Father        age less than 41   Uterine cancer Sister        suicide   Colon cancer Paternal Aunt 29       also had skin cancer, ovarian?   Depression Other    Thyroid disease Neg Hx    Pancreatic cancer Neg Hx     Social History   Socioeconomic History   Marital status: Married    Spouse name: Neiva Maenza   Number of children: Not on file   Years of education: 12   Highest education level: 12th grade  Occupational History   Not on file  Tobacco Use   Smoking status: Former    Packs/day: 1.00    Years: 5.00    Total pack years: 5.00    Types: Cigarettes    Quit date: 11/03/1997    Years since quitting: 24.9    Passive exposure: Current   Smokeless tobacco: Never   Tobacco  comments:    1 pack 1 week    Smoking Cessation Offered  Vaping Use   Vaping Use: Never used  Substance and Sexual Activity   Alcohol use: No    Alcohol/week: 0.0 standard drinks of alcohol   Drug use: No   Sexual activity: Yes    Partners: Male    Birth control/protection: Surgical  Other Topics Concern   Not on file  Social History Narrative   Not on file   Social Determinants of Health   Financial Resource Strain: Low Risk  (08/05/2022)   Overall Financial Resource Strain (CARDIA)    Difficulty of Paying Living Expenses: Not hard at all  Food Insecurity: No Food Insecurity (08/05/2022)   Hunger Vital Sign    Worried About Running Out of Food in the Last Year: Never true    Ran Out of Food in the Last Year: Never true  Transportation Needs: No Transportation Needs (08/05/2022)   PRAPARE - Hydrologist (Medical): No    Lack of Transportation (Non-Medical): No  Physical Activity: Inactive (08/05/2022)   Exercise Vital Sign    Days of Exercise per Week: 0 days    Minutes of Exercise per Session: 0 min  Stress: No Stress Concern Present (08/05/2022)   [place]    Feeling of Stress : Only a little  Social Connections: Socially Integrated (08/05/2022)   Social Connection and Isolation Panel [NHANES]    Frequency of Communication with Friends and Family: More than three times a week    Frequency of Social Gatherings with Friends and Family: More than three times a week    Attends Religious Services: More than 4 times per year    Active Member of [employer] or Organizations: Yes    Attends Archivist Meetings: More than 4 times per year    Marital Status: Married  Human resources officer Violence: Not At Risk (08/05/2022)   Humiliation, Afraid, Rape, and Kick questionnaire    Fear of Current or Ex-Partner: No    Emotionally Abused: No    Physically Abused: No  Sexually Abused: No     Review of Systems: See HPI, otherwise negative ROS  Physical Exam: LMP 10/09/2014  General:   Alert,   pleasant and cooperative in NAD; accompanied by her daughter  Impression/Plan: 89 year old morbidly obese lady with a history of mixed IBS doing well on return visit.  She is deriving significant benefit from daily fiber supplementation.  Positive family history of colon cancer; due for surveillance colonoscopy in 5 years  GERD well-controlled on omeprazole 40 mg daily.  Recommendations:  Increasing fiber 4 capsules a day is a good idea  May use Bentyl or dicyclomine as needed for bouts of abdominal cramps and loose bowels  Omeprazole 40 mg once daily before breakfast should control your reflux symptoms  For repeat colonoscopy in 5 years  Office visit here in 1 year and as needed.  Of note, blood pressure up today.  Repeat check indicates is still elevated.  Should be rechecked by your primary care physician in the near future.  Notice: This dictation was prepared with Dragon dictation along with smaller phrase technology. Any transcriptional errors that result from this process are unintentional and may not be corrected upon review.

## 2022-09-30 NOTE — Patient Instructions (Signed)
It was good to see you again today!  Your irritable bowel syndrome seems to be under good control.  Increasing fiber 4 capsules a day is a good idea  May use Bentyl or dicyclomine as needed for bouts of abdominal cramps and loose bowels  Omeprazole 40 mg once daily before breakfast should control your reflux symptoms  For repeat colonoscopy in 5 years  Office visit here in 1 year and as needed.  Of note, blood pressure up today.  Repeat check indicates is still elevated.  Should be rechecked by your primary care physician in the near future.

## 2022-10-02 NOTE — Patient Instructions (Signed)
Asthma Begin Qvar 80-2 puffs twice a day to prevent cough and wheeze. Continue to rinse and spit after each use. Continue albuterol 2 puffs every 4 hours as needed for cough or wheeze OR Instead use albuterol 0.083% solution via nebulizer one unit vial every 4 hours as needed for cough or wheeze We will submit to your insurance to move forward with Tezspire injections. You will next hear from our biologics coordinator, Tammy with next steps We have ordered some labs to help [place] assess your breathing. We will call you when the results become available  Allergic rhinitis Continue allergen avoidance measures directed toward dust mite and mold as listed below Continue cetirizine 10 mg once a day as needed for runny nose or itch Continue Flonase 2 sprays in each nostril once a day as needed for stuffy nose Continue azelastine 2 sprays in each nostril twice a day as needed for runny nose Consider saline nasal rinses as needed for nasal symptoms. Use this before any medicated nasal sprays for best result  Increased risk for viral illness Your COVID testing was negative at today's visit Continue to mask in public places and maintain social distancing at this time.   Call the clinic if this treatment plan is not working well for you  Follow up in 4 weeks or sooner if needed.   Control of Dust Mite Allergen Dust mites play a major role in allergic asthma and rhinitis. They occur in environments with high humidity wherever human skin is found. Dust mites absorb humidity from the atmosphere (ie, they do not drink) and feed on organic matter (including shed human and animal skin). Dust mites are a microscopic type of insect that you cannot see with the naked eye. High levels of dust mites have been detected from mattresses, pillows, carpets, upholstered furniture, bed covers, clothes, soft toys and any woven material. The principal allergen of the dust mite is found in its feces. A gram of dust may contain  1,000 mites and 250,000 fecal particles. Mite antigen is easily measured in the air during house cleaning activities. Dust mites do not bite and do not cause harm to humans, other than by triggering allergies/asthma.  Ways to decrease your exposure to dust mites in your home:  1. Encase mattresses, box springs and pillows with a mite-impermeable barrier or cover  2. Wash sheets, blankets and drapes weekly in hot water (130 F) with detergent and dry them in a dryer on the hot setting.  3. Have the room cleaned frequently with a vacuum cleaner and a damp dust-mop. For carpeting or rugs, vacuuming with a vacuum cleaner equipped with a high-efficiency particulate air (HEPA) filter. The dust mite allergic individual should not be in a room which is being cleaned and should wait 1 hour after cleaning before going into the room.  4. Do not sleep on upholstered furniture (eg, couches).  5. If possible removing carpeting, upholstered furniture and drapery from the home is ideal. Horizontal blinds should be eliminated in the rooms where the person spends the most time (bedroom, study, television room). Washable vinyl, roller-type shades are optimal.  6. Remove all non-washable stuffed toys from the bedroom. Wash stuffed toys weekly like sheets and blankets above.  7. Reduce indoor humidity to less than 50%. Inexpensive humidity monitors can be purchased at most hardware stores. Do not use a humidifier as can make the problem worse and are not recommended.  Control of Mold Allergen Mold and fungi can grow on a  variety of surfaces provided certain temperature and moisture conditions exist.  Outdoor molds grow on plants, decaying vegetation and soil.  The major outdoor mold, Alternaria and Cladosporium, are found in very high numbers during hot and dry conditions.  Generally, a late Summer - Fall peak is seen for common outdoor fungal spores.  Rain will temporarily lower outdoor mold spore count, but counts  rise rapidly when the rainy period ends.  The most important indoor molds are Aspergillus and Penicillium.  Dark, humid and poorly ventilated basements are ideal sites for mold growth.  The next most common sites of mold growth are the bathroom and the kitchen.  Outdoor [employer] Use air conditioning and keep windows closed Avoid exposure to decaying vegetation. Avoid leaf raking. Avoid grain handling. Consider wearing a face mask if working in moldy areas.  Indoor Mold Control Maintain humidity below 50%. Clean washable surfaces with 5% bleach solution. Remove sources e.g. Contaminated carpets.

## 2022-10-02 NOTE — Progress Notes (Signed)
[address] [place] [address] Dept: [phone]  FOLLOW UP NOTE  Patient ID: Heather Fry, female    DOB: [birth year]  Age: 47 y.o. MRN: [id number] Date of Office Visit: 10/03/2022  Assessment  Chief Complaint: Follow-up (Follow up ) and asthma (Jan-3 had asthma attack and did neb treatment helped some .)  HPI Heather Fry is a 47 year old female who presents to the clinic for follow-up visit.  She was last seen in this clinic on 06/06/2022 by Dr. Dellis Anes as a new patient on 06/06/2022 for evaluation of asthma with frequent thrush after using ICS inhalers and allergic rhinitis.  In the interim, she visited her primary care provider for bronchitis where she received cephalexin which she began taking on 09/16/2022 for total of 10 days.  At today's visit, she reports her asthma remains poorly controlled with symptoms including shortness of breath which is worse with activity, occasional wheeze, and cough producing thick yellow mucus.  She continues using Atrovent via nebulizer and albuterol inhaler several times a day with moderate relief of symptoms.  She reports that on Thursday she used back to back inhalers with several hours without relief of symptoms with resolution occurring suddenly after several hours.  She reports that she has previously tried Symbicort which caused thrush for which she used an antifungal and had an anaphylactic reaction.  Since that time she has been on LAMA and SABA therapy with poor asthma control.  Chart review indicates total IgE 158, however, she does not have any perennial allergies and CBC with differential from 06/06/2022 indicates absolute eosinophils 0.1.  She unfortunately does not qualify for an IL-5 inhibitor or an anti IgE injectable. Her last environmental allergy testing was on 06/06/2022 and was positive to mold and dust mite.  She did receive allergen immunotherapy for 2 injections after which she reported flulike symptoms after each  injection. Allergic rhinitis is reported as moderately well controlled with clear rhinorrhea as the main symptom. She reports that she has not received any COVID vaccines and has not had an influenza vaccine for the last several years. She does not currently mask or social distance in public and is worried about her own health as well as her father who will soon be having an extensive abdominal surgery. Her current medications are listed in the chart.    Drug Allergies:  Allergies  Allergen Reactions   Levofloxacin Other (See Comments)    Pt can not have due to taking Lexapro    Toradol [Ketorolac Tromethamine] Anaphylaxis and Hives   Tramadol Hives   Levomenol     Other reaction(s): Other   Other Other (See Comments)   Chamomile Nausea And Vomiting    Lexapro  Pt states this is incorrect   Hctz [Hydrochlorothiazide] Other (See Comments)    Dehydration   Lisinopril Nausea Only   Losartan Potassium Other (See Comments)    GI upset   Nystatin     oral   Penicillins Other (See Comments)    Loopy, childhood allergy  ALL CILLINS     Triamcinolone Acetonide     Elevated blood pressure, caused diabetes, changed tyroid electrolytes Patient has no tyroid      Physical Exam: BP 130/72   Pulse (!) 101   Temp 99 F (37.2 C) (Temporal)   Resp 20   Ht 5\' 4"  (1.626 m)   Wt 271 lb (122.9 kg)   LMP 10/09/2014   SpO2 95%   BMI 46.52 kg/m  Physical Exam Vitals reviewed.  Constitutional:      Appearance: Normal appearance.  HENT:     Head: Normocephalic and atraumatic.     Right Ear: Tympanic membrane normal.     Left Ear: Tympanic membrane normal.     Nose:     Comments: Bilateral nares slightly erythematous with clear nasal drainage noted. Pharynx normal. Ears normal. Eyes normal.    Mouth/Throat:     Pharynx: Oropharynx is clear.  Eyes:     Conjunctiva/sclera: Conjunctivae normal.  Cardiovascular:     Rate and Rhythm: Normal rate and regular rhythm.     Heart sounds:  Normal heart sounds. No murmur heard. Pulmonary:     Effort: Pulmonary effort is normal.     Breath sounds: Normal breath sounds.     Comments: Lungs clear to auscultation Musculoskeletal:        General: Normal range of motion.     Cervical back: Normal range of motion and neck supple.  Skin:    General: Skin is warm and dry.  Neurological:     Mental Status: She is alert and oriented to person, place, and time.  Psychiatric:        Mood and Affect: Mood normal.        Behavior: Behavior normal.        Thought Content: Thought content normal.        Judgment: Judgment normal.     Diagnostics: FVC 2.52, FEV1 1.80.  Predicted FVC 3.54, predicted FEV1 2.85.  Spirometry indicates possible restriction.  Assessment and Plan: 1. Seasonal and perennial allergic rhinitis   2. Not well controlled severe persistent asthma   3. At increased risk of exposure to COVID-19 virus     Meds ordered this encounter  Medications   beclomethasone (QVAR REDIHALER) 80 MCG/ACT inhaler    Sig: Inhale 2 puffs into the lungs 2 (two) times daily.    Dispense:  1 each    Refill:  0    Patient Instructions  Asthma Begin Qvar 80-2 puffs twice a day to prevent cough and wheeze. Continue to rinse and spit after each use. Continue albuterol 2 puffs every 4 hours as needed for cough or wheeze OR Instead use albuterol 0.083% solution via nebulizer one unit vial every 4 hours as needed for cough or wheeze We will submit to your insurance to move forward with Tezspire injections. You will next hear from our biologics coordinator, Tammy with next steps We have ordered some labs to help [place] assess your breathing. We will call you when the results become available  Allergic rhinitis Continue allergen avoidance measures directed toward dust mite and mold as listed below Continue cetirizine 10 mg once a day as needed for runny nose or itch Continue Flonase 2 sprays in each nostril once a day as needed for stuffy  nose Continue azelastine 2 sprays in each nostril twice a day as needed for runny nose Consider saline nasal rinses as needed for nasal symptoms. Use this before any medicated nasal sprays for best result  Increased risk for viral illness Your COVID testing was negative at today's visit Continue to mask in public places and maintain social distancing at this time.   Call the clinic if this treatment plan is not working well for you  Follow up in 4 weeks or sooner if needed.   Return in about 4 weeks (around 10/31/2022), or if symptoms worsen or fail to improve.    Thank you for the  opportunity to care for this patient.  Please do not hesitate to contact me with questions.  Thermon Leyland, FNP [facility]

## 2022-10-03 ENCOUNTER — Ambulatory Visit (INDEPENDENT_AMBULATORY_CARE_PROVIDER_SITE_OTHER): Payer: Medicaid Other | Admitting: Family Medicine

## 2022-10-03 ENCOUNTER — Other Ambulatory Visit: Payer: Self-pay

## 2022-10-03 ENCOUNTER — Encounter: Payer: Self-pay | Admitting: Family Medicine

## 2022-10-03 VITALS — BP 130/72 | HR 101 | Temp 99.0°F | Resp 20 | Ht 64.0 in | Wt 271.0 lb

## 2022-10-03 DIAGNOSIS — J3089 Other allergic rhinitis: Secondary | ICD-10-CM | POA: Diagnosis not present

## 2022-10-03 DIAGNOSIS — J302 Other seasonal allergic rhinitis: Secondary | ICD-10-CM

## 2022-10-03 DIAGNOSIS — Z9189 Other specified personal risk factors, not elsewhere classified: Secondary | ICD-10-CM | POA: Diagnosis not present

## 2022-10-03 DIAGNOSIS — J4550 Severe persistent asthma, uncomplicated: Secondary | ICD-10-CM

## 2022-10-03 MED ORDER — QVAR REDIHALER 80 MCG/ACT IN AERB: 2.0000 | INHALATION_SPRAY | Freq: Two times a day (BID) | RESPIRATORY_TRACT | 0 refills | Status: DC

## 2022-10-05 ENCOUNTER — Encounter: Payer: Self-pay | Admitting: Family Medicine

## 2022-10-05 MED ORDER — TEZSPIRE 210 MG/1.91ML ~~LOC~~ SOAJ: 1.0000 | SUBCUTANEOUS | 0 refills | Status: DC

## 2022-10-06 ENCOUNTER — Telehealth: Payer: Self-pay | Admitting: *Deleted

## 2022-10-06 LAB — BASIC METABOLIC PANEL
BUN/Creatinine Ratio: 17 (ref 9–23)
BUN: 12 mg/dL (ref 6–24)
CO2: 22 mmol/L (ref 20–29)
Calcium: 9.7 mg/dL (ref 8.7–10.2)
Chloride: 101 mmol/L (ref 96–106)
Creatinine, Ser: 0.7 mg/dL (ref 0.57–1.00)
Glucose: 145 mg/dL — ABNORMAL HIGH (ref 70–99)
Potassium: 4.2 mmol/L (ref 3.5–5.2)
Sodium: 138 mmol/L (ref 134–144)
eGFR: 108 mL/min/{1.73_m2} (ref >59)

## 2022-10-06 LAB — BRAIN NATRIURETIC PEPTIDE: BNP: 3.7 pg/mL (ref 0.0–100.0)

## 2022-10-06 MED ORDER — TEZSPIRE 210 MG/1.91ML ~~LOC~~ SOSY
210.0000 mg | PREFILLED_SYRINGE | SUBCUTANEOUS | 11 refills | Status: DC
  Filled 2022-10-06 – 2022-10-09 (×2): qty 1.91, 28d supply, fill #0

## 2022-10-06 NOTE — Telephone Encounter (Signed)
Called patient and advised approval and submit for Tezspire to [place] and will reach out once delivery set to make appt to start therapy

## 2022-10-06 NOTE — Telephone Encounter (Signed)
-----   Message from Dara Hoyer, FNP sent at 10/05/2022  9:01 PM EST ----- Hi there Roy Snuffer, Can you please submit this patient for Tezspire for asthma control? Thank you

## 2022-10-06 NOTE — Progress Notes (Signed)
Can you please let this patient know that her BNP and BMP lab work was all within normal range with the exception of glucose. She was not fasting that day and this is a lab value we would expect for glucose when not fasting for testing. Thank you

## 2022-10-07 ENCOUNTER — Other Ambulatory Visit (HOSPITAL_COMMUNITY): Payer: Self-pay

## 2022-10-07 ENCOUNTER — Other Ambulatory Visit: Payer: Self-pay

## 2022-10-07 ENCOUNTER — Emergency Department (HOSPITAL_COMMUNITY): Payer: Medicaid Other

## 2022-10-07 DIAGNOSIS — E119 Type 2 diabetes mellitus without complications: Secondary | ICD-10-CM | POA: Insufficient documentation

## 2022-10-07 DIAGNOSIS — I4891 Unspecified atrial fibrillation: Secondary | ICD-10-CM | POA: Insufficient documentation

## 2022-10-07 DIAGNOSIS — Z79899 Other long term (current) drug therapy: Secondary | ICD-10-CM | POA: Diagnosis not present

## 2022-10-07 DIAGNOSIS — Z7984 Long term (current) use of oral hypoglycemic drugs: Secondary | ICD-10-CM | POA: Diagnosis not present

## 2022-10-07 DIAGNOSIS — E039 Hypothyroidism, unspecified: Secondary | ICD-10-CM | POA: Diagnosis not present

## 2022-10-07 DIAGNOSIS — R079 Chest pain, unspecified: Secondary | ICD-10-CM | POA: Insufficient documentation

## 2022-10-07 DIAGNOSIS — R61 Generalized hyperhidrosis: Secondary | ICD-10-CM | POA: Diagnosis not present

## 2022-10-07 DIAGNOSIS — R002 Palpitations: Secondary | ICD-10-CM | POA: Diagnosis not present

## 2022-10-07 DIAGNOSIS — R0602 Shortness of breath: Secondary | ICD-10-CM | POA: Diagnosis not present

## 2022-10-07 LAB — BASIC METABOLIC PANEL
Anion gap: 10 (ref 5–15)
BUN: 11 mg/dL (ref 6–20)
CO2: 24 mmol/L (ref 22–32)
Calcium: 9 mg/dL (ref 8.9–10.3)
Chloride: 101 mmol/L (ref 98–111)
Creatinine, Ser: 0.72 mg/dL (ref 0.44–1.00)
GFR, Estimated: >60 mL/min (ref >60)
Glucose, Bld: 196 mg/dL — ABNORMAL HIGH (ref 70–99)
Potassium: 3.8 mmol/L (ref 3.5–5.1)
Sodium: 135 mmol/L (ref 135–145)

## 2022-10-07 LAB — CBC
HCT: 39.6 % (ref 36.0–46.0)
Hemoglobin: 13 g/dL (ref 12.0–15.0)
MCH: 28 pg (ref 26.0–34.0)
MCHC: 32.8 g/dL (ref 30.0–36.0)
MCV: 85.3 fL (ref 80.0–100.0)
Platelets: 271 10*3/uL (ref 150–400)
RBC: 4.64 MIL/uL (ref 3.87–5.11)
RDW: 14.8 % (ref 11.5–15.5)
WBC: 8.6 10*3/uL (ref 4.0–10.5)
nRBC: 0 % (ref 0.0–0.2)

## 2022-10-07 LAB — TROPONIN I (HIGH SENSITIVITY): Troponin I (High Sensitivity): 4 ng/L (ref <18)

## 2022-10-07 NOTE — Telephone Encounter (Signed)
Thank you Tammy!

## 2022-10-07 NOTE — ED Triage Notes (Signed)
Pt presents reporting her watch alerted her 3 different times that she was in afib.  Pt states she did have tightness and warmth in her chest at that time.  Pt states symptoms resolved on arrival to the ED.  Had work up by cardiology in December with and has no afib hx.

## 2022-10-08 ENCOUNTER — Emergency Department (HOSPITAL_COMMUNITY)
Admission: EM | Admit: 2022-10-08 | Discharge: 2022-10-08 | Disposition: A | Discharge status: Home / Self Care | Payer: Medicaid Other | Attending: Emergency Medicine | Admitting: Emergency Medicine

## 2022-10-08 ENCOUNTER — Other Ambulatory Visit (HOSPITAL_COMMUNITY): Payer: Self-pay

## 2022-10-08 DIAGNOSIS — R0789 Other chest pain: Secondary | ICD-10-CM

## 2022-10-08 LAB — TROPONIN I (HIGH SENSITIVITY): Troponin I (High Sensitivity): 4 ng/L (ref <18)

## 2022-10-08 NOTE — Discharge Instructions (Signed)
Continue medications as previously prescribed.  Follow-up with your primary doctor/cardiologist if symptoms persist.

## 2022-10-08 NOTE — ED Provider Notes (Signed)
[facility] EMERGENCY DEPARTMENT Provider Note   CSN: [id number] Arrival date & time: 10/07/22  2215     History  Chief Complaint  Patient presents with   Chest Pain    Heather Fry is a 47 y.o. female.  Patient is a 47 year old female with past medical history of hypothyroidism, GERD, sleep apnea, diabetes, obesity.  Patient presenting today with complaints of chest pain and palpitations.  While she was making chili this evening, she developed the sudden onset of a heaviness in her chest with associated shortness of breath and feeling sweaty.  She reports her smart watch reading A-fib on several occasions while this episode was going on.  Symptoms seem to wax and wane, however are now mostly improved.  She also reports recently being diagnosed with bronchitis.  She has been using her albuterol inhaler and nebulizer with some frequency throughout the day.  Patient has no prior cardiac history, but did have a normal echocardiogram and nuclear stress test in January 2023.  The history is provided by the patient.       Home Medications Prior to Admission medications   Medication Sig Start Date End Date Taking? Authorizing Provider  acarbose (PRECOSE) 100 MG tablet Take 100 mg by mouth daily as needed (blood glucose above 160). 11/01/21   [provider]  acetaminophen (TYLENOL) 500 MG tablet Take 1,000 mg by mouth daily as needed for moderate pain.    [provider]  albuterol (VENTOLIN HFA) 108 (90 Base) MCG/ACT inhaler Inhale 2 puffs into the lungs every 6 (six) hours as needed. 12/13/19   Julian Hy, DO  azelastine (ASTELIN) 0.1 % nasal spray 2 sprays per nostril 1-2 times daily. Patient taking differently: Place 1-2 sprays into both nostrils daily as needed for allergies. 06/06/22   Valentina Shaggy, MD  beclomethasone (QVAR REDIHALER) 80 MCG/ACT inhaler Inhale 2 puffs into the lungs 2 (two) times daily. 10/03/22   Dara Hoyer, FNP  cetirizine (ZYRTEC) 10  MG tablet Take 10 mg by mouth daily.    [provider]  Cholecalciferol (VITAMIN D3) 125 MCG (5000 UT) CAPS Take 1 capsule (5,000 Units total) by mouth daily. Patient taking differently: Take 10,000 Units by mouth daily. 10/13/19   Renato Shin, MD  Continuous Blood Gluc Sensor (DEXCOM G7 SENSOR) MISC USE AS DIRECTED EVERY 10 DAYS 09/26/22   [provider]  dicyclomine (BENTYL) 10 MG capsule Take 1 capsule (10 mg total) by mouth every 8 (eight) hours as needed for spasms. Take 1 capsule (10 mg total) by mouth up to 3 times daily before meals and at bedtime for abdominal cramping or diarrhea.  Hold in the setting of constipation. 08/07/22   Rourk, Cristopher Estimable, MD  EPINEPHrine 0.3 mg/0.3 mL IJ SOAJ injection Inject 0.3 mg into the muscle as needed for anaphylaxis. 07/02/22   Valentina Shaggy, MD  EQUATE STOOL SOFTENER 100 MG capsule Take 100 mg by mouth 2 (two) times daily as needed for moderate constipation. 08/21/21   [provider]  escitalopram (LEXAPRO) 20 MG tablet Take 1 tablet (20 mg total) by mouth at bedtime. 01/26/19   Renato Shin, MD  fluticasone [facility]) 50 MCG/ACT nasal spray Place 2 sprays into both nostrils at bedtime as needed for allergies.    [provider]  glimepiride (AMARYL) 1 MG tablet Take 1 mg by mouth daily as needed (blood glucose above 200). 01/24/22   [provider]  glucose blood (ACCU-CHEK AVIVA PLUS) test  strip 1 each by Other route daily. And lancets 1/day 05/30/21   Renato Shin, MD  ipratropium (ATROVENT) 0.02 % nebulizer solution Take 2.5 mLs (0.5 mg total) by nebulization every 6 (six) hours as needed. 06/24/22   Valentina Shaggy, MD  Magnesium 250 MG TABS Take 250 mg by mouth at bedtime.     [provider]  meclizine (ANTIVERT) 25 MG tablet Take 25 mg by mouth 4 (four) times daily as needed for dizziness. 10/29/21   [provider]  NON FORMULARY at bedtime. CPAP at night    [provider]  omeprazole (PRILOSEC) 40 MG capsule Take 1 capsule (40 mg total) by mouth 2 (two) times daily before a meal. 08/05/22   Rourk, Cristopher Estimable, MD  ondansetron (ZOFRAN ODT) 4 MG disintegrating tablet Take 1 tablet (4 mg total) by mouth every 8 (eight) hours as needed for nausea or vomiting. 08/05/22   Rourk, Cristopher Estimable, MD  rosuvastatin (CRESTOR) 10 MG tablet Take 10 mg by mouth every Saturday. 12/05/21   [provider]  Simethicone 250 MG CAPS Take 500 mg by mouth at bedtime.    [provider]  sucralfate (CARAFATE) 1 GM/10ML suspension TAKE 10 ML BY MOUTH  4 TIMES DAILY AS NEEDED 06/09/22   Erenest Rasher, PA-C  SYNTHROID 137 MCG tablet Take 137 mcg by mouth daily before breakfast. 03/14/22   [provider]  tezepelumab-ekko (TEZSPIRE) 210 MG/1.91ML syringe Inject 1.91 mLs (210 mg total) into the skin every 28 (twenty-eight) days. 10/06/22   Dara Hoyer, FNP      Allergies    Levofloxacin, Toradol [ketorolac tromethamine], Tramadol, Levomenol, Other, Chamomile, Hctz [hydrochlorothiazide], Lisinopril, Losartan potassium, Nystatin, Penicillins, and Triamcinolone acetonide    Review of Systems   Review of Systems  All other systems reviewed and are negative.   Physical Exam Updated Vital Signs BP (!) 177/113 (BP Location: Left Arm)   Pulse 100   Temp 98.4 F (36.9 C) (Oral)   Resp 20   LMP 10/09/2014   SpO2 96%  Physical Exam Vitals and nursing note reviewed.  Constitutional:      General: She is not in acute distress.    Appearance: She is well-developed. She is not diaphoretic.  HENT:     Head: Normocephalic and atraumatic.  Cardiovascular:     Rate and Rhythm: Regular rhythm.     Heart sounds: No murmur heard.    No friction rub. No gallop.  Pulmonary:     Effort: Pulmonary effort is normal. No respiratory distress.     Breath sounds: Normal breath sounds. No wheezing.  Abdominal:     General: Bowel sounds are normal. There is no  distension.     Palpations: Abdomen is soft.     Tenderness: There is no abdominal tenderness.  Musculoskeletal:        General: Normal range of motion.     Cervical back: Normal range of motion and neck supple.  Skin:    General: Skin is warm and dry.  Neurological:     General: No focal deficit present.     Mental Status: She is alert and oriented to person, place, and time.     ED Results / Procedures / Treatments   Labs (all labs ordered are listed, but only abnormal results are displayed) Labs Reviewed  BASIC METABOLIC PANEL - Abnormal; Notable for the following components:      Result Value   Glucose, Bld 196 (*)  All other components within normal limits  CBC  POC URINE PREG, ED  TROPONIN I (HIGH SENSITIVITY)  TROPONIN I (HIGH SENSITIVITY)    EKG EKG Interpretation  Date/Time:  Tuesday October 07 2022 22:29:00 EST Ventricular Rate:  91 PR Interval:  150 QRS Duration: 68 QT Interval:  376 QTC Calculation: 462 R Axis:   50 Text Interpretation: Sinus rhythm Normal ECG When compared with ECG of 04-Apr-2022 14:06, no significant changes are noted Confirmed by Veryl Speak [phone]) on 10/08/2022 2:05:45 AM  Radiology DG Chest 2 View  Result Date: 10/07/2022 CLINICAL DATA:  Chest pain EXAM: CHEST - 2 VIEW COMPARISON:  02/08/2022 FINDINGS: Atelectasis or scarring at the lingula. No consolidation or effusion. Normal cardiac size. No pneumothorax IMPRESSION: No active disease. Electronically Signed   By: Donavan Foil M.D.   On: 10/07/2022 23:22    Procedures Procedures    Medications Ordered in ED Medications - No data to display  ED Course/ Medical Decision Making/ A&P  Patient is a 47 year old female with past medical history as per HPI presenting with complaints of chest discomfort and palpitations.  Her smart watch informed her she may be having atrial fibrillation.  She arrives here with stable vital signs, regular rhythm, and no hypoxia or fever.  Workup  initiated including CBC, metabolic panel, and troponin x 2.  These were all unremarkable.  Chest x-ray shows no acute process.  EKG shows normal sinus rhythm with no acute changes.  At this point, nothing indicates a cardiac etiology for her discomfort.  Symptoms may either be related to an esophageal spasm or musculoskeletal etiology, but nothing indicates a cardiac etiology.  She has had a normal stress test within the past year.  I have also reviewed the rhythm strips on her smart phone, none of which appear to be atrial fibrillation.  She does seem to have a sinus tachycardia, likely related to use of her albuterol for her bronchitis.  At this point, I feel as though patient can safely be discharged with outpatient follow-up.  Final Clinical Impression(s) / ED Diagnoses Final diagnoses:  None    Rx / DC Orders ED Discharge Orders     None         Veryl Speak, MD 10/08/22 [phone]

## 2022-10-09 ENCOUNTER — Other Ambulatory Visit: Payer: Self-pay

## 2022-10-09 ENCOUNTER — Other Ambulatory Visit (HOSPITAL_COMMUNITY): Payer: Self-pay

## 2022-10-10 ENCOUNTER — Other Ambulatory Visit (HOSPITAL_COMMUNITY): Payer: Self-pay

## 2022-10-10 ENCOUNTER — Other Ambulatory Visit: Payer: Self-pay

## 2022-10-16 ENCOUNTER — Other Ambulatory Visit (HOSPITAL_COMMUNITY): Payer: Self-pay

## 2022-10-21 NOTE — Progress Notes (Unsigned)
Cardiology Office Note   Date:  10/22/2022   ID:  Heather Fry, Heather Fry 08-18-1976, MRN [id number]  PCP:  Redmond School, MD  Cardiologist:   None Referring:  Redmond School, MD  Chief Complaint  Patient presents with   Shortness of Breath      History of Present Illness: Heather Fry is a 47 y.o. female who presents for evaluation of chest pain.  She was in the ED in Nov  2022 for this.  She had a  Lexiscan Myoview on 10/13/2019, which showed normal perfusion, LVEF 63%.  TTE on 10/19/2019 showed normal LVEF, normal RV function, no significant valvular disease.  She saw Dr. Gardiner Rhyme at that time for evaluation of chest pain.    She was in the ED earlier this month for evaluation of chest pain.   I reviewed these records for this visit.   She said her wearable suggested atrial fib but the ED provider reviewed her phone and did not see this.  There was no objective evidence of ischemia and her pain was thought to be non anginal.    She is having problems with her asthma right now and her blood pressure is up because of that.  The patient denies any new symptoms such as chest discomfort, neck or arm discomfort. There has been no new  PND or orthopnea. There have been no reported palpitations, presyncope or syncope.    Past Medical History:  Diagnosis Date   Allergic asthma    Anemia    Anxiety    Arthritis    Depression    Diabetes mellitus without complication ([place])    Diverticulosis    Fatty liver    moderate to severe   GERD (gastroesophageal reflux disease)    Graves disease    Headache    tension, ocular migraines   Hypothyroidism    Internal hemorrhoids    Sleep apnea    Thyroid disease    Uterine fibroid     Past Surgical History:  Procedure Laterality Date   ABDOMINAL HYSTERECTOMY     Per patient, uterine fibroids.   ABDOMINAL HYSTERECTOMY     CARPAL TUNNEL RELEASE Right 06/11/2021   Procedure: Right Carpel tunnel release;  Surgeon: Vallarie Mare, MD;  Location: [facility];  Service: Neurosurgery;  Laterality: Right;  RM 18   CHOLECYSTECTOMY N/A 11/06/2014   Procedure: LAPAROSCOPIC CHOLECYSTECTOMY;  Surgeon: Jamesetta So, MD;  Location: [facility];  Service: General;  Laterality: N/A;   COLONOSCOPY N/A 03/04/2017   Dr. Gala Romney: Hemorrhoids, diverticulosis, benign polyp without adenomatous changes.  Next colonoscopy 10 years   COLONOSCOPY WITH PROPOFOL N/A 08/28/2022   Procedure: COLONOSCOPY WITH PROPOFOL;  Surgeon: Daneil Dolin, MD;  [facility];  Service: Endoscopy;  Laterality: N/A;  9:00 am   DILATION AND CURETTAGE OF UTERUS     ESOPHAGOGASTRODUODENOSCOPY N/A 10/02/2014   Dr. Gala Romney: fundic gland polyps, hiatal hernia   ESOPHAGOGASTRODUODENOSCOPY  07/2020   [facility] ; normal esophagus biopsied, benign-appearing gastric polyps biopsied, gastric biopsies obtained to evaluate for H. pylori, normal duodenum.  Pathology with mild chronic gastritis, negative for H. pylori, fundic gland polyps, esophageal biopsy benign.   MOUTH SURGERY     extraction of teeth   POLYPECTOMY  03/04/2017   Procedure: POLYPECTOMY;  Surgeon: Daneil Dolin, MD;  [facility];  Service: Endoscopy;;  colon   RIGHT OOPHORECTOMY  07/2021   THYROIDECTOMY N/A 09/30/2018  Procedure: TOTAL THYROIDECTOMY;  Surgeon: Armandina Gemma, MD;  Location: [facility];  Service: General;  Laterality: N/A;   TUBAL LIGATION       Current Outpatient Medications  Medication Sig Dispense Refill   acarbose (PRECOSE) 100 MG tablet Take 100 mg by mouth daily as needed (blood glucose above 160).     acetaminophen (TYLENOL) 500 MG tablet Take 1,000 mg by mouth daily as needed for moderate pain.     albuterol (VENTOLIN HFA) 108 (90 Base) MCG/ACT inhaler Inhale 2 puffs into the lungs every 6 (six) hours as needed. 18 g 11   cetirizine (ZYRTEC) 10 MG tablet Take 10 mg by mouth daily.     Cholecalciferol (VITAMIN D3) 125 MCG (5000 UT) CAPS Take 1 capsule (5,000  Units total) by mouth daily. (Patient taking differently: Take 10,000 Units by mouth daily.) 30 capsule 11   dicyclomine (BENTYL) 10 MG capsule Take 1 capsule (10 mg total) by mouth every 8 (eight) hours as needed for spasms. Take 1 capsule (10 mg total) by mouth up to 3 times daily before meals and at bedtime for abdominal cramping or diarrhea.  Hold in the setting of constipation. 60 capsule 11   EQUATE STOOL SOFTENER 100 MG capsule Take 100 mg by mouth 2 (two) times daily as needed for moderate constipation.     escitalopram (LEXAPRO) 20 MG tablet Take 1 tablet (20 mg total) by mouth at bedtime. 30 tablet 5   fluticasone (FLONASE) 50 MCG/ACT nasal spray Place 2 sprays into both nostrils at bedtime as needed for allergies.     glimepiride (AMARYL) 1 MG tablet Take 1 mg by mouth daily as needed (blood glucose above 200).     ipratropium (ATROVENT) 0.02 % nebulizer solution Take 2.5 mLs (0.5 mg total) by nebulization every 6 (six) hours as needed. 150 mL 2   Magnesium 250 MG TABS Take 250 mg by mouth at bedtime.      meclizine (ANTIVERT) 25 MG tablet Take 25 mg by mouth 4 (four) times daily as needed for dizziness.     omeprazole (PRILOSEC) 40 MG capsule Take 1 capsule (40 mg total) by mouth 2 (two) times daily before a meal. 60 capsule 11   ondansetron (ZOFRAN ODT) 4 MG disintegrating tablet Take 1 tablet (4 mg total) by mouth every 8 (eight) hours as needed for nausea or vomiting. 40 tablet 11   rosuvastatin (CRESTOR) 10 MG tablet Take 10 mg by mouth every Saturday.     Simethicone 250 MG CAPS Take 500 mg by mouth at bedtime.     sucralfate (CARAFATE) 1 GM/10ML suspension TAKE 10 ML BY MOUTH  4 TIMES DAILY AS NEEDED 828 mL 0   SYNTHROID 137 MCG tablet Take 137 mcg by mouth daily before breakfast.     beclomethasone (QVAR REDIHALER) 80 MCG/ACT inhaler Inhale 2 puffs into the lungs 2 (two) times daily. (Patient not taking: Reported on 10/22/2022) 1 each 0   Continuous Blood Gluc Sensor (DEXCOM G7  SENSOR) MISC USE AS DIRECTED EVERY 10 DAYS     EPINEPHrine 0.3 mg/0.3 mL IJ SOAJ injection Inject 0.3 mg into the muscle as needed for anaphylaxis. 2 each 1   glucose blood (ACCU-CHEK AVIVA PLUS) test strip 1 each by Other route daily. And lancets 1/day 100 each 3   NON FORMULARY at bedtime. CPAP at night     tezepelumab-ekko (TEZSPIRE) 210 MG/1.91ML syringe Inject 1.91 mLs (210 mg total) into the skin every 28 (twenty-eight) days. (Patient not  taking: Reported on 10/22/2022) 1.91 mL 11   No current facility-administered medications for this visit.    Allergies:   Levofloxacin, Toradol [ketorolac tromethamine], Tramadol, Levomenol, Other, Chamomile, Hctz [hydrochlorothiazide], Lisinopril, Losartan potassium, Nystatin, Penicillins, and Triamcinolone acetonide    ROS:  Please see the history of present illness.   Otherwise, review of systems are positive for none.   All other systems are reviewed and negative.    PHYSICAL EXAM: VS:  BP (!) 164/108   Pulse 80   Ht '5\' 4"'$  (1.626 m)   Wt 267 lb (121.1 kg)   LMP 10/09/2014   BMI 45.83 kg/m  , BMI Body mass index is 45.83 kg/m. GENERAL:  Well appearing NECK:  No jugular venous distention, waveform within normal limits, carotid upstroke brisk and symmetric, no bruits, no thyromegaly LUNGS:  Clear to auscultation bilaterally CHEST:  Unremarkable HEART:  PMI not displaced or sustained,S1 and S2 within normal limits, no S3, no S4, no clicks, no rubs, no murmurs ABD:  Flat, positive bowel sounds normal in frequency in pitch, no bruits, no rebound, no guarding, no midline pulsatile mass, no hepatomegaly, no splenomegaly EXT:  2 plus pulses throughout, no edema, no cyanosis no clubbing   EKG:  EKG is not ordered today. The ekg ordered 10/07/2022 demonstrates sinus rhythm, rate 91, axis within normal limits, QTc prolonged, no acute ST-T wave changes.   Recent Labs: 04/05/2022: ALT 25; Magnesium 2.1 10/03/2022: BNP 3.7 10/07/2022: BUN 11; Creatinine,  Ser 0.72; Hemoglobin 13.0; Platelets 271; Potassium 3.8; Sodium 135    Lipid Panel    Component Value Date/Time   CHOL 222 (H) 01/20/2020 0417   TRIG 306 (H) 01/20/2020 0417   HDL 55 01/20/2020 0417   CHOLHDL 4.0 01/20/2020 0417   VLDL 61 (H) 01/20/2020 0417   LDLCALC 106 (H) 01/20/2020 0417   LDLDIRECT 135.0 06/29/2019 1458      Wt Readings from Last 3 Encounters:  10/22/22 267 lb (121.1 kg)  10/03/22 271 lb (122.9 kg)  09/30/22 267 lb 3.2 oz (121.2 kg)      Other studies Reviewed: Additional studies/ records that were reviewed today include: ED records Review of the above records demonstrates:   NA   ASSESSMENT AND PLAN:   HTN:   Blood pressure is elevated today but she shows me her home readings with reliable monitor and her blood pressures are in the 130s over 80s.  No change in therapy.  She does not want to take Cozaar which was suggested for its renal protective effects.  Think it is reasonable for her to stay off further polypharmacy effects of choice.   Chest pain: She has had nonanginal chest discomfort.  No change in therapy.   Sleep apnea:    She uses CPAP.  No change in therapy.  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    Current medicines are reviewed at length with the patient today.  The patient does not have concerns regarding medicines.  The following changes have been made:  None  Labs/ tests ordered today include: None  Orders Placed This Encounter  Procedures   EKG 12-Lead     Disposition:   FU with one year.    Signed, Minus Breeding, MD  10/22/2022 1:43 PM    [facility]

## 2022-10-22 ENCOUNTER — Ambulatory Visit (INDEPENDENT_AMBULATORY_CARE_PROVIDER_SITE_OTHER): Payer: Medicaid Other | Admitting: Cardiology

## 2022-10-22 ENCOUNTER — Encounter: Payer: Self-pay | Admitting: Cardiology

## 2022-10-22 VITALS — BP 164/108 | HR 80 | Ht 64.0 in | Wt 267.0 lb

## 2022-10-22 DIAGNOSIS — G4733 Obstructive sleep apnea (adult) (pediatric): Secondary | ICD-10-CM | POA: Diagnosis not present

## 2022-10-22 DIAGNOSIS — R0602 Shortness of breath: Secondary | ICD-10-CM | POA: Diagnosis not present

## 2022-10-22 DIAGNOSIS — R072 Precordial pain: Secondary | ICD-10-CM

## 2022-10-22 NOTE — Patient Instructions (Signed)
Medication Instructions:  The current medical regimen is effective;  continue present plan and medications.  *If you need a refill on your cardiac medications before your next appointment, please call your pharmacy*  Follow-Up: At [facility], you and your health needs are our priority.  As part of our continuing mission to provide you with exceptional heart care, we have created designated Provider Care Teams.  These Care Teams include your primary Cardiologist (physician) and Advanced Practice Providers (APPs -  Physician Assistants and Nurse Practitioners) who all work together to provide you with the care you need, when you need it.  We recommend signing up for the patient portal called "MyChart".  Sign up information is provided on this After Visit Summary.  MyChart is used to connect with patients for Virtual Visits (Telemedicine).  Patients are able to view lab/test results, encounter notes, upcoming appointments, etc.  Non-urgent messages can be sent to your provider as well.   To learn more about what you can do with MyChart, go to [email or web address].    Your next appointment:   1 year(s)  Provider:   James Hochrein, MD     

## 2022-11-07 ENCOUNTER — Ambulatory Visit (INDEPENDENT_AMBULATORY_CARE_PROVIDER_SITE_OTHER): Payer: Medicaid Other | Admitting: Allergy & Immunology

## 2022-11-07 ENCOUNTER — Telehealth: Payer: Self-pay

## 2022-11-07 ENCOUNTER — Encounter: Payer: Self-pay | Admitting: Allergy & Immunology

## 2022-11-07 ENCOUNTER — Telehealth: Payer: Self-pay | Admitting: Allergy & Immunology

## 2022-11-07 VITALS — BP 126/82 | HR 93 | Temp 98.7°F | Resp 20 | Wt 271.5 lb

## 2022-11-07 DIAGNOSIS — J302 Other seasonal allergic rhinitis: Secondary | ICD-10-CM

## 2022-11-07 DIAGNOSIS — J4540 Moderate persistent asthma, uncomplicated: Secondary | ICD-10-CM | POA: Diagnosis not present

## 2022-11-07 DIAGNOSIS — R899 Unspecified abnormal finding in specimens from other organs, systems and tissues: Secondary | ICD-10-CM | POA: Diagnosis not present

## 2022-11-07 DIAGNOSIS — B999 Unspecified infectious disease: Secondary | ICD-10-CM | POA: Diagnosis not present

## 2022-11-07 DIAGNOSIS — J3089 Other allergic rhinitis: Secondary | ICD-10-CM

## 2022-11-07 MED ORDER — QVAR REDIHALER 80 MCG/ACT IN AERB: 2.0000 | INHALATION_SPRAY | Freq: Two times a day (BID) | RESPIRATORY_TRACT | 0 refills | Status: DC

## 2022-11-07 MED ORDER — AZELASTINE HCL 0.15 % NA SOLN: 2.0000 | Freq: Two times a day (BID) | NASAL | 5 refills | Status: DC

## 2022-11-07 NOTE — Telephone Encounter (Signed)
Please advise 

## 2022-11-07 NOTE — Telephone Encounter (Signed)
Called and informed patient, patient verbalized understanding.

## 2022-11-07 NOTE — Progress Notes (Signed)
Next medical FOLLOW UP  Date of Service/Encounter:  11/07/22   Assessment:   Moderate persistent asthma, uncomplicated    Intolerant of inhaled corticosteroids due to recurrent thrush   Seasonal and perennial allergic rhinitis (indoor molds, outdoor molds, and dust mites)   Multiple drug allergies versus intolerances  Plan/Recommendations:   1. Moderate persistent asthma, uncomplicated - Lung testing looks BETTER today.  - Heather Heather Fry is not in the same class as the Heather Heather Fry, but we can hold off until we see if the Qvar helps at all.  - Daily controller medication(s): Qvar 91mg Redihaler 2 puffs twice daily - We will work through the PA process for this (this one should NOT cause thrush).  - Rescue medications: Atrovent (ipratropium) one treatment daily as needed OR Xopenex nebulizer treatment Heather Fry 6 hours as needed. - Asthma control goals:  * Full participation in all desired activities (may need albuterol before activity) * Albuterol use two time or less a week on average (not counting use with activity) * Cough interfering with sleep two time or less a month * Oral steroids no more than once a year * No hospitalizations  2. Perennial and seasonal allergic rhinitis - Previous testing showed: indoor molds, outdoor molds, and dust mites. - We are obviously not doing any allergy shots!  - Continue with: Flonase and Zyrtec - ADD ON with: Astelin (azelastine) 2 sprays per nostril 1-2 times daily as needed (at least use during these emerging sinus issues)  - You can use an extra dose of the antihistamine, if needed, for breakthrough symptoms.  - Consider nasal saline rinses 1-2 times daily to remove allergens from the nasal cavities as well as help with mucous clearance (this is especially helpful to do before the nasal sprays are given)  3. Recurrent infections - We will obtain some screening labs to evaluate your immune system.  - Labs to evaluate the quantitative ([facility]  aspects of your immune system: IgG/IgA/IgM, CBC with differential - Labs to evaluate the qualitative ([place] aspects of your immune system: CH50, Pneumococcal titers, Tetanus titers, Diphtheria titers - We may consider immunizations with Pneumovax and Tdap to challenge your immune system, and then obtain repeat titers in 4-6 weeks.   4. Return in about 4 months (around 03/08/2023).    Subjective:   Heather SEELINGERis a 47y.o. female presenting today for follow up of  Chief Complaint  Patient presents with  . Follow-up    Feels like she might have bronchitis again. Cough,wheeze,SOB, chest tightness  PCP said that tezpire is in the same category as Heather Heather Fry  -Qvar needs a PA     Heather LEMASTERShas a history of the following: Patient Active Problem List   Diagnosis Date Noted  . Moderate persistent asthma, uncomplicated 00000000 . Seasonal and perennial allergic rhinitis 06/08/2022  . Multiple drug allergies 06/08/2022  . Generalized abdominal pain 05/09/2022  . Allergic reaction caused by a drug 04/04/2022  . Oral thrush 04/04/2022  . Type 2 diabetes mellitus with hyperglycemia ([place] 04/04/2022  . Fatty liver 02/21/2022  . FH: colon cancer in relative diagnosed at >57years old 02/21/2022  . Daytime somnolence 01/11/2022  . Morbid obesity ([place] 01/11/2022  . Psychophysiologic insomnia 01/11/2022  . Dyslipidemia 10/15/2021  . DOE (dyspnea on exertion) 10/15/2021  . Essential hypertension 10/15/2021  . Precordial chest pain 10/15/2021  . Hypocalcemia 08/08/2021  . Diabetes ([place] 05/30/2021  . Disc disease, degenerative, cervical 04/19/2021  .  Right carpal tunnel syndrome 04/19/2021  . Irritable bowel syndrome 04/17/2021  . Weight gain 12/20/2020  . Asthma without status asthmaticus 07/29/2020  . History of Graves' disease 07/29/2020  . Postoperative hypothyroidism 07/29/2020  . Diarrhea 05/08/2020  . Idiopathic peripheral neuropathy 04/18/2020  .  Myelopathy ([place]) 04/18/2020  . OSA (obstructive sleep apnea) 04/18/2020  . RLS (restless legs syndrome) 04/18/2020  . Sensory disturbance 01/20/2020  . Atypical chest pain 01/20/2020  . Obesity, Class III, BMI 40-49.9 (morbid obesity) ([place]) 01/20/2020  . Weakness 01/19/2020  . Hyperglycemia 10/04/2019  . Myalgia 09/05/2019  . Vitamin B 12 deficiency 07/28/2019  . Right sided numbness 06/29/2019  . Constipation 06/15/2019  . Elevated lipase 06/15/2019  . Hypomagnesemia 03/29/2019  . Leukocytosis 03/29/2019  . Pain of upper abdomen 02/10/2019  . Nausea without vomiting 02/10/2019  . Tingling of face 11/30/2018  . Hot flashes 11/30/2018  . Vitamin D deficiency 10/06/2018  . Gastroesophageal reflux disease 01/28/2017  . Rectal bleeding 01/28/2017  . 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 Acquired hypothyroidism 04/14/2016  . Gastric polyp   . RUQ abdominal pain 09/06/2014  . Excessive or frequent menstruation 01/13/2013  . Depression     History obtained from: chart review and {Persons; PED relatives w/patient:19415::"patient"}.  Heather Heather Fry is a 47 y.o. female presenting for {Blank single:19197::"a food challenge","a drug challenge","skin testing","a sick visit","an evaluation of ***","a follow up visit"}.  She was last seen in January 2024 by Heather Heather Fry, one of our nurse practitioners.  At that time, she was started on Qvar 80 mcg 2 puffs twice daily as well as albuterol as needed.  She was interested in starting Tezspire for control of her asthma symptoms.  She was approved for this medication, but she has not started it yet.  For her allergic rhinitis, she continued on cetirizine as well as Flonase and Astelin.  She was on allergy shots, but was having vague symptoms Heather Fry 1 so he stopped after 2 injections.  She had COVID testing that was negative.  Since the last visit, she has continued to have problems.   Asthma/Respiratory Symptom History: She reports that she is having a "choking sensation".  She reports that she is out of "whack" right now. Her thyroid medication is all over the place. It messes up her gut and she felt that she was having a gastritis flare.  Qvar was never filled because she needed a PA. We never got notification about this. She has been using the neb and albuterol inhaler. She is able to go a few days without it, but recently she has been using it more consistently. She was approved for the Tezspire. She was told that this is the same class as Heather Heather Fry.   Allergic Rhinitis Symptom History: She is using the Flonase two sprays per nostril at bedtime. She has not been using the Astelin right now because it as making her nose more dry.   She has had a lot of issues with recurrent illnesses.   {Blank single:19197::"Food Allergy Symptom History: ***"," "}  {Blank single:19197::"Skin Symptom History: ***"," "}  GERD Symptom History: She is sucralfate  and omperazole BID. She is being changed to the brand name Synthroid.   Otherwise, there have been no changes to her past medical history, surgical history, family history, or social history.    ROS     Objective:   Blood pressure 126/82, pulse 93, temperature 98.7 F (37.1 C), resp. rate 20, weight 271 lb 8 oz (123.2 kg), last menstrual period  10/09/2014, SpO2 97 %. Body mass index is 46.6 kg/m.    Physical Exam   Diagnostic studies:    Spirometry: results abnormal (FEV1: 2.14/75%, FVC: 2.59/73%, FEV1/FVC: 83%).    Spirometry consistent with normal pattern. This is the best that I have seen her breathing look.    Allergy Studies: none       Salvatore Marvel, MD  [facility] and [facility] [facility] [place]

## 2022-11-07 NOTE — Patient Instructions (Addendum)
1. Moderate persistent asthma, uncomplicated - Lung testing looks BETTER today.  - Cheron Every is not in the same class as the Singulair, but we can hold off until we see if the Qvar helps at all.  - Daily controller medication(s): Qvar 25mg Redihaler 2 puffs twice daily - We will work through the PA process for this (this one should NOT cause thrush).  - Rescue medications: Atrovent (ipratropium) one treatment daily as needed OR Xopenex nebulizer treatment every 6 hours as needed. - Asthma control goals:  * Full participation in all desired activities (may need albuterol before activity) * Albuterol use two time or less a week on average (not counting use with activity) * Cough interfering with sleep two time or less a month * Oral steroids no more than once a year * No hospitalizations  2. Perennial and seasonal allergic rhinitis - Previous testing showed: indoor molds, outdoor molds, and dust mites. - We are obviously not doing any allergy shots!  - Continue with: Flonase and Zyrtec - ADD ON with: Astelin (azelastine) 2 sprays per nostril 1-2 times daily as needed (at least use during these emerging sinus issues)  - You can use an extra dose of the antihistamine, if needed, for breakthrough symptoms.  - Consider nasal saline rinses 1-2 times daily to remove allergens from the nasal cavities as well as help with mucous clearance (this is especially helpful to do before the nasal sprays are given)  3. Recurrent infections - We will obtain some screening labs to evaluate your immune system.  - Labs to evaluate the quantitative ([facility] aspects of your immune system: IgG/IgA/IgM, CBC with differential - Labs to evaluate the qualitative ([place] aspects of your immune system: CH50, Pneumococcal titers, Tetanus titers, Diphtheria titers - We may consider immunizations with Pneumovax and Tdap to challenge your immune system, and then obtain repeat titers in 4-6 weeks.   4. Return  in about 4 months (around 03/08/2023).    Please inform u[place]oreaof any Emergency Department visits, hospitalizations, or changes in symptoms. Call uKoreabefore going to the ED for breathing or allergy symptoms since we might be able to fit you in for a sick visit. Feel free to contact uKoreaanytime with any questions, problems, or concerns.  It was a pleasure to see you and meet your oldest daughter today!  Websites that have reliable patient information: 1. American Academy of Asthma, Allergy, and Immunology: www.aaaai.org 2. Food Allergy Research and Education (FARE): foodallergy.org 3. Mothers of Asthmatics: http://www.asthmacommunitynetwork.org 4. American College of Allergy, Asthma, and Immunology: www.acaai.org   COVID-19 Vaccine Information can be found at: [email or web address] questions related to vaccine distribution or appointments, please email [email or web address] or call 39713683540   We realize that you might be concerned about having an allergic reaction to the COVID19 vaccines. To help with that concern, WE ARE OFFERING THE COVID19 VACCINES IN OUR OFFICE! Ask the front desk for dates!     "Like" u[place]oreaon Facebook and Instagram for our latest updates!      A healthy democracy works best when ANew York Life Insuranceparticipate! Make sure you are registered to vote! If you have moved or changed any of your contact information, you will need to get this updated before voting!  In some cases, you MAY be able to register to vote online: [email or web address]

## 2022-11-07 NOTE — Telephone Encounter (Signed)
I have an renal function panel added to the labs already. That includes electrolytes and calcium. I listened to her when she asked for that.   Salvatore Marvel, MD Allergy and [place] [facility] [place]

## 2022-11-07 NOTE — Telephone Encounter (Signed)
Patient came in today for an OV and stated that she could not get her QVAR due to it needing a PA.  Patient has tried -Spiriva Respimat -Symbicort -Pulmicort Flexhaler -Breo Ellipta -Incruse Ellipta   Please initiate PA for patient.

## 2022-11-07 NOTE — Telephone Encounter (Signed)
Patient wants her electrolytes and calcium checked - can that be added to her blood work? She mentioned they took an extra tube of blood when she had her labs drawn today.

## 2022-11-14 ENCOUNTER — Other Ambulatory Visit (HOSPITAL_COMMUNITY): Payer: Self-pay

## 2022-11-19 ENCOUNTER — Telehealth: Payer: Self-pay

## 2022-11-19 ENCOUNTER — Other Ambulatory Visit (HOSPITAL_COMMUNITY): Payer: Self-pay

## 2022-11-19 LAB — STREP PNEUMONIAE 23 SEROTYPES IGG
Pneumo Ab Type 1*: 9.3 ug/mL (ref >1.3)
Pneumo Ab Type 12 (12F)*: 0.6 ug/mL — ABNORMAL LOW (ref >1.3)
Pneumo Ab Type 14*: 13.7 ug/mL (ref >1.3)
Pneumo Ab Type 17 (17F)*: 4.9 ug/mL (ref >1.3)
Pneumo Ab Type 19 (19F)*: 4.3 ug/mL (ref >1.3)
Pneumo Ab Type 2*: 3.3 ug/mL (ref >1.3)
Pneumo Ab Type 20*: 14.1 ug/mL (ref >1.3)
Pneumo Ab Type 22 (22F)*: 4.8 ug/mL (ref >1.3)
Pneumo Ab Type 23 (23F)*: 0.3 ug/mL — ABNORMAL LOW (ref >1.3)
Pneumo Ab Type 26 (6B)*: 9.7 ug/mL (ref >1.3)
Pneumo Ab Type 3*: 0.4 ug/mL — ABNORMAL LOW (ref >1.3)
Pneumo Ab Type 34 (10A)*: 15.1 ug/mL (ref >1.3)
Pneumo Ab Type 4*: 0.2 ug/mL — ABNORMAL LOW (ref >1.3)
Pneumo Ab Type 43 (11A)*: 0.4 ug/mL — ABNORMAL LOW (ref >1.3)
Pneumo Ab Type 5*: 2.7 ug/mL (ref >1.3)
Pneumo Ab Type 51 (7F)*: 1.6 ug/mL (ref >1.3)
Pneumo Ab Type 54 (15B)*: 4.1 ug/mL (ref >1.3)
Pneumo Ab Type 56 (18C)*: 5.2 ug/mL (ref >1.3)
Pneumo Ab Type 57 (19A)*: 2.2 ug/mL (ref >1.3)
Pneumo Ab Type 68 (9V)*: 0.5 ug/mL — ABNORMAL LOW (ref >1.3)
Pneumo Ab Type 70 (33F)*: >20.2 ug/mL (ref >1.3)
Pneumo Ab Type 8*: 5 ug/mL (ref >1.3)
Pneumo Ab Type 9 (9N)*: 1.1 ug/mL — ABNORMAL LOW (ref >1.3)

## 2022-11-19 LAB — CBC WITH DIFFERENTIAL
Basophils Absolute: 0 10*3/uL (ref 0.0–0.2)
Basos: 0 %
EOS (ABSOLUTE): 0.1 10*3/uL (ref 0.0–0.4)
Eos: 2 %
Hematocrit: 38.2 % (ref 34.0–46.6)
Hemoglobin: 12.5 g/dL (ref 11.1–15.9)
Immature Grans (Abs): 0 10*3/uL (ref 0.0–0.1)
Immature Granulocytes: 0 %
Lymphocytes Absolute: 2.5 10*3/uL (ref 0.7–3.1)
Lymphs: 30 %
MCH: 27.6 pg (ref 26.6–33.0)
MCHC: 32.7 g/dL (ref 31.5–35.7)
MCV: 84 fL (ref 79–97)
Monocytes Absolute: 0.3 10*3/uL (ref 0.1–0.9)
Monocytes: 4 %
Neutrophils Absolute: 5.2 10*3/uL (ref 1.4–7.0)
Neutrophils: 64 %
RBC: 4.53 x10E6/uL (ref 3.77–5.28)
RDW: 14.6 % (ref 11.7–15.4)
WBC: 8.2 10*3/uL (ref 3.4–10.8)

## 2022-11-19 LAB — IGG, IGA, IGM
IgA/Immunoglobulin A, Serum: 200 mg/dL (ref 87–352)
IgG (Immunoglobin G), Serum: 985 mg/dL (ref 586–1602)
IgM (Immunoglobulin M), Srm: 188 mg/dL (ref 26–217)

## 2022-11-19 LAB — RENAL FUNCTION PANEL
Albumin: 4.3 g/dL (ref 3.9–4.9)
BUN/Creatinine Ratio: 13 (ref 9–23)
BUN: 9 mg/dL (ref 6–24)
CO2: 20 mmol/L (ref 20–29)
Calcium: 9.1 mg/dL (ref 8.7–10.2)
Chloride: 102 mmol/L (ref 96–106)
Creatinine, Ser: 0.71 mg/dL (ref 0.57–1.00)
Glucose: 152 mg/dL — ABNORMAL HIGH (ref 70–99)
Phosphorus: 3.8 mg/dL (ref 3.0–4.3)
Potassium: 4.1 mmol/L (ref 3.5–5.2)
Sodium: 140 mmol/L (ref 134–144)
eGFR: 106 mL/min/{1.73_m2} (ref >59)

## 2022-11-19 LAB — DIPHTHERIA / TETANUS ANTIBODY PANEL
Diphtheria Ab: 0.57 IU/mL (ref <0.10)
Tetanus Ab, IgG: 2.93 IU/mL (ref <0.10)

## 2022-11-19 LAB — COMPLEMENT, TOTAL: Compl, Total (CH50): >60 U/mL (ref >41)

## 2022-11-19 NOTE — Telephone Encounter (Signed)
PA has been DENIED due to:   Here are the policy requirements your request did not meet: Per your health plan's criteria, this drug is covered if you meet the following: One of the following: (1) You have failed two preferred drugs as confirmed by claims history or submission of medical records: The preferred drugs: Flovent diskus, Flovent hfa inhaler, budesonide suspension 0.55m, 0.572m 58m50mgeneric for Pulmicort Respules), fluticasone propionate diskus (generic for Flovent Diskus), fluticasone propionate HFA (generic for Flovent HFA). (2) You cannot use two preferred drugs (please specify contraindication or intolerance). The information provided does not show that you meet the criteria listed above.

## 2022-11-19 NOTE — Telephone Encounter (Signed)
PA request submitted and is pending determination. Will be updated in additional encounter created.

## 2022-11-19 NOTE — Telephone Encounter (Signed)
Would you like to switch to Flovent Diskus, budesonide suspension 0.25, 0.5, fluticasone propionate diskus, and fluticasone HFA.

## 2022-11-19 NOTE — Telephone Encounter (Signed)
PA request received via provider for Qvar RediHaler 80MCG/ACT aerosol  PA has been submitted via CMM to [employer] and is pending determination.   Key: [id number]

## 2022-11-20 ENCOUNTER — Other Ambulatory Visit (HOSPITAL_COMMUNITY): Payer: Self-pay

## 2022-11-20 NOTE — Telephone Encounter (Signed)
Would you be able to resubmit the PA and include that in there?

## 2022-11-20 NOTE — Telephone Encounter (Signed)
PA resubmitted and pending determination.  Key: [id number]

## 2022-11-20 NOTE — Telephone Encounter (Signed)
We chose Qvar because it is a pro drug and does not become activated until it enters the lungs.  She has a history of thrush with other inhaled steroids, so we wanted to avoid that.  Salvatore Marvel, MD [facility] [place] [facility]

## 2022-11-21 NOTE — Telephone Encounter (Signed)
PA has been DENIED again for patient not meeting prior authorization requirements.

## 2022-11-27 MED ORDER — STIOLTO RESPIMAT 2.5-2.5 MCG/ACT IN AERS: 2.0000 | INHALATION_SPRAY | Freq: Two times a day (BID) | RESPIRATORY_TRACT | 5 refills | Status: DC

## 2022-11-27 NOTE — Addendum Note (Signed)
Addended by: Eloy End D on: 11/27/2022 10:38 AM   Modules accepted: Orders

## 2022-11-27 NOTE — Telephone Encounter (Signed)
Stiolto two puffs daily has been sent into the pharmacy. Epic states that it is a preferred drug.

## 2022-11-27 NOTE — Telephone Encounter (Signed)
She cannot do inhaled steroids due to recurrent episodes of thrush. We can try changing to Stiolto two puffs daily. Will that work?   Salvatore Marvel, MD [facility]

## 2022-11-28 ENCOUNTER — Other Ambulatory Visit: Payer: Self-pay | Admitting: *Deleted

## 2022-11-28 MED ORDER — STIOLTO RESPIMAT 2.5-2.5 MCG/ACT IN AERS: 2.0000 | INHALATION_SPRAY | Freq: Every day | RESPIRATORY_TRACT | 5 refills | Status: DC

## 2022-12-11 ENCOUNTER — Telehealth: Payer: Self-pay | Admitting: Allergy & Immunology

## 2022-12-11 NOTE — Telephone Encounter (Signed)
Patient called and said that she is almost out of her Qvar and has not heard anything about it . [facility] in [place]. [phone].

## 2022-12-11 NOTE — Telephone Encounter (Signed)
Left detailed voice message letting patient know that on 11/27/22 Stiolto (two puffs daily) was sent into pharmacy to replace Qvar. Qvar PA was denied so Dr. Ernst Bowler went with [facility] to replace this. Pharmacy was suppose to let patient know when the medication was ready for pick up.

## 2022-12-21 ENCOUNTER — Telehealth: Payer: Medicaid Other | Admitting: Physician Assistant

## 2022-12-21 ENCOUNTER — Encounter (HOSPITAL_COMMUNITY): Payer: Self-pay

## 2022-12-21 ENCOUNTER — Emergency Department (HOSPITAL_COMMUNITY)
Admission: EM | Admit: 2022-12-21 | Discharge: 2022-12-21 | Disposition: A | Discharge status: Home / Self Care | Payer: Medicaid Other | Attending: Emergency Medicine | Admitting: Emergency Medicine

## 2022-12-21 ENCOUNTER — Other Ambulatory Visit: Payer: Self-pay

## 2022-12-21 VITALS — BP 120/73 | HR 91

## 2022-12-21 DIAGNOSIS — Z7984 Long term (current) use of oral hypoglycemic drugs: Secondary | ICD-10-CM | POA: Insufficient documentation

## 2022-12-21 DIAGNOSIS — R5381 Other malaise: Secondary | ICD-10-CM

## 2022-12-21 DIAGNOSIS — R5383 Other fatigue: Secondary | ICD-10-CM

## 2022-12-21 DIAGNOSIS — E039 Hypothyroidism, unspecified: Secondary | ICD-10-CM | POA: Insufficient documentation

## 2022-12-21 DIAGNOSIS — R42 Dizziness and giddiness: Secondary | ICD-10-CM | POA: Insufficient documentation

## 2022-12-21 DIAGNOSIS — Z79899 Other long term (current) drug therapy: Secondary | ICD-10-CM | POA: Insufficient documentation

## 2022-12-21 DIAGNOSIS — E1165 Type 2 diabetes mellitus with hyperglycemia: Secondary | ICD-10-CM | POA: Diagnosis not present

## 2022-12-21 DIAGNOSIS — Z7951 Long term (current) use of inhaled steroids: Secondary | ICD-10-CM | POA: Diagnosis not present

## 2022-12-21 DIAGNOSIS — J45909 Unspecified asthma, uncomplicated: Secondary | ICD-10-CM | POA: Insufficient documentation

## 2022-12-21 DIAGNOSIS — Z1152 Encounter for screening for COVID-19: Secondary | ICD-10-CM | POA: Diagnosis not present

## 2022-12-21 DIAGNOSIS — R109 Unspecified abdominal pain: Secondary | ICD-10-CM | POA: Insufficient documentation

## 2022-12-21 DIAGNOSIS — E860 Dehydration: Secondary | ICD-10-CM | POA: Diagnosis not present

## 2022-12-21 LAB — COMPREHENSIVE METABOLIC PANEL
ALT: 30 U/L (ref 0–44)
AST: 23 U/L (ref 15–41)
Albumin: 3.8 g/dL (ref 3.5–5.0)
Alkaline Phosphatase: 54 U/L (ref 38–126)
Anion gap: 10 (ref 5–15)
BUN: 11 mg/dL (ref 6–20)
CO2: 24 mmol/L (ref 22–32)
Calcium: 8.7 mg/dL — ABNORMAL LOW (ref 8.9–10.3)
Chloride: 101 mmol/L (ref 98–111)
Creatinine, Ser: 0.61 mg/dL (ref 0.44–1.00)
GFR, Estimated: >60 mL/min (ref >60)
Glucose, Bld: 168 mg/dL — ABNORMAL HIGH (ref 70–99)
Potassium: 3.7 mmol/L (ref 3.5–5.1)
Sodium: 135 mmol/L (ref 135–145)
Total Bilirubin: 0.5 mg/dL (ref 0.3–1.2)
Total Protein: 7.1 g/dL (ref 6.5–8.1)

## 2022-12-21 LAB — MAGNESIUM: Magnesium: 1.9 mg/dL (ref 1.7–2.4)

## 2022-12-21 LAB — RESP PANEL BY RT-PCR (RSV, FLU A&B, COVID)  RVPGX2
Influenza A by PCR: NEGATIVE
Influenza B by PCR: NEGATIVE
Resp Syncytial Virus by PCR: NEGATIVE
SARS Coronavirus 2 by RT PCR: NEGATIVE

## 2022-12-21 LAB — CBC
HCT: 37 % (ref 36.0–46.0)
Hemoglobin: 12.2 g/dL (ref 12.0–15.0)
MCH: 28.2 pg (ref 26.0–34.0)
MCHC: 33 g/dL (ref 30.0–36.0)
MCV: 85.6 fL (ref 80.0–100.0)
Platelets: 242 10*3/uL (ref 150–400)
RBC: 4.32 MIL/uL (ref 3.87–5.11)
RDW: 14.4 % (ref 11.5–15.5)
WBC: 9.1 10*3/uL (ref 4.0–10.5)
nRBC: 0 % (ref 0.0–0.2)

## 2022-12-21 LAB — LIPASE, BLOOD: Lipase: 46 U/L (ref 11–51)

## 2022-12-21 LAB — TSH: TSH: 1.141 u[IU]/mL (ref 0.350–4.500)

## 2022-12-21 MED ORDER — SODIUM CHLORIDE 0.9 % IV BOLUS
1000.0000 mL | Freq: Once | INTRAVENOUS | Status: AC
  Administered 2022-12-21: 1000 mL via INTRAVENOUS

## 2022-12-21 NOTE — Discharge Instructions (Signed)
You have been seen today for your complaint of fatigue. Your lab work was overall very reassuring. Home care instructions are as follows:  Eat a normal diet.  Drink plenty of water Follow up with: Your primary care provider as needed Please seek immediate medical care if you develop any of the following symptoms: You feel confused, feel like you might faint, or faint. Your vision is blurry or you have a severe headache. You have severe pain in your abdomen, your back, or the area between your waist and hips (pelvis). You have chest pain, shortness of breath, or an irregular or fast heartbeat. You are unable to urinate, or you urinate less than normal. You have abnormal bleeding from the rectum, nose, lungs, nipples, or, if you are female, the vagina. You vomit blood. You have thoughts about hurting yourself or others. At this time there does not appear to be the presence of an emergent medical condition, however there is always the potential for conditions to change. Please read and follow the below instructions.  Do not take your medicine if  develop an itchy rash, swelling in your mouth or lips, or difficulty breathing; call 911 and seek immediate emergency medical attention if this occurs.  You may review your lab tests and imaging results in their entirety on your MyChart account.  Please discuss all results of fully with your primary care provider and other specialist at your follow-up visit.  Note: Portions of this text may have been transcribed using voice recognition software. Every effort was made to ensure accuracy; however, inadvertent computerized transcription errors may still be present.

## 2022-12-21 NOTE — ED Provider Notes (Signed)
[facility] Provider Note   CSN: [id number] Arrival date & time: 12/21/22  1628     History  Chief Complaint  Patient presents with   Abdominal Pain    Heather Fry is a 47 y.o. female.  With a significant history including but not limited to hypothyroidism, non-insulin-dependent type 2 diabetes, GERD, arthritis, fatty liver disease, anemia, anxiety, depression, seasonal allergies, asthma who presents to the ED for evaluation of fatigue and dehydration.  She states her symptoms began at approximately noon yesterday.  Believes it may be due to increase in her Synthroid that was 1 month ago.  Also reports mild intermittent abdominal pain, 1 episode of diarrhea yesterday, 1 episode of lightheadedness.  Had a virtual visit today for her symptoms and was reportedly told to come to the ED for further evaluation.  She denies sick contacts, chest pain, shortness of breath, cough, dizziness, nausea, vomiting, fevers, chills.  She took a Bentyl yesterday which resolved her diarrhea.  Reports polydipsia but denies polyuria, polyphagia.  Denies pelvic pain, urinary symptoms, vaginal symptoms, low back pain.   Abdominal Pain Associated symptoms: fatigue        Home Medications Prior to Admission medications   Medication Sig Start Date End Date Taking? Authorizing Provider  acarbose (PRECOSE) 100 MG tablet Take 100 mg by mouth daily as needed (blood glucose above 160). 11/01/21   [provider]  acetaminophen (TYLENOL) 500 MG tablet Take 1,000 mg by mouth daily as needed for moderate pain.    [provider]  albuterol (VENTOLIN HFA) 108 (90 Base) MCG/ACT inhaler Inhale 2 puffs into the lungs every 6 (six) hours as needed. 12/13/19   Julian Hy, DO  Azelastine HCl 0.15 % SOLN Place 2 sprays into both nostrils 2 (two) times daily. 11/07/22   Valentina Shaggy, MD  beclomethasone (QVAR REDIHALER) 80 MCG/ACT inhaler Inhale 2 puffs  into the lungs 2 (two) times daily. 11/07/22   Valentina Shaggy, MD  cetirizine (ZYRTEC) 10 MG tablet Take 10 mg by mouth daily.    [provider]  Cholecalciferol (VITAMIN D3) 125 MCG (5000 UT) CAPS Take 1 capsule (5,000 Units total) by mouth daily. Patient taking differently: Take 10,000 Units by mouth daily. 10/13/19   Renato Shin, MD  Continuous Blood Gluc Sensor (DEXCOM G7 SENSOR) MISC USE AS DIRECTED EVERY 10 DAYS 09/26/22   [provider]  dicyclomine (BENTYL) 10 MG capsule Take 1 capsule (10 mg total) by mouth every 8 (eight) hours as needed for spasms. Take 1 capsule (10 mg total) by mouth up to 3 times daily before meals and at bedtime for abdominal cramping or diarrhea.  Hold in the setting of constipation. 08/07/22   Rourk, Cristopher Estimable, MD  EPINEPHrine 0.3 mg/0.3 mL IJ SOAJ injection Inject 0.3 mg into the muscle as needed for anaphylaxis. 07/02/22   Valentina Shaggy, MD  EQUATE STOOL SOFTENER 100 MG capsule Take 100 mg by mouth 2 (two) times daily as needed for moderate constipation. 08/21/21   [provider]  escitalopram (LEXAPRO) 20 MG tablet Take 1 tablet (20 mg total) by mouth at bedtime. 01/26/19   Renato Shin, MD  FIBER PO Take by mouth.    [provider]  fluticasone (FLONASE) 50 MCG/ACT nasal spray Place 2 sprays into both nostrils at bedtime as needed for allergies.    [provider]  glimepiride (AMARYL) 1 MG tablet Take 1 mg by mouth daily  as needed (blood glucose above 200). 01/24/22   [provider]  glucose blood (ACCU-CHEK AVIVA PLUS) test strip 1 each by Other route daily. And lancets 1/day 05/30/21   Renato Shin, MD  ipratropium (ATROVENT) 0.02 % nebulizer solution Take 2.5 mLs (0.5 mg total) by nebulization every 6 (six) hours as needed. 06/24/22   Valentina Shaggy, MD  Magnesium 250 MG TABS Take 250 mg by mouth at bedtime.     [provider]  meclizine (ANTIVERT) 25 MG tablet Take 25 mg by  mouth 4 (four) times daily as needed for dizziness. 10/29/21   [provider]  NON FORMULARY at bedtime. CPAP at night    [provider]  omeprazole (PRILOSEC) 40 MG capsule Take 1 capsule (40 mg total) by mouth 2 (two) times daily before a meal. 08/05/22   Rourk, Cristopher Estimable, MD  ondansetron (ZOFRAN ODT) 4 MG disintegrating tablet Take 1 tablet (4 mg total) by mouth every 8 (eight) hours as needed for nausea or vomiting. 08/05/22   Rourk, Cristopher Estimable, MD  rosuvastatin (CRESTOR) 10 MG tablet Take 10 mg by mouth every Saturday. 12/05/21   [provider]  Simethicone 250 MG CAPS Take 500 mg by mouth at bedtime.    [provider]  sucralfate (CARAFATE) 1 GM/10ML suspension TAKE 10 ML BY MOUTH  4 TIMES DAILY AS NEEDED 06/09/22   Erenest Rasher, PA-C  SYNTHROID 137 MCG tablet Take 137 mcg by mouth daily before breakfast. 03/14/22   [provider]  tezepelumab-ekko (TEZSPIRE) 210 MG/1.91ML syringe Inject 1.91 mLs (210 mg total) into the skin every 28 (twenty-eight) days. Patient not taking: Reported on 10/22/2022 10/06/22   Dara Hoyer, FNP  Tiotropium Bromide-Olodaterol (STIOLTO RESPIMAT) 2.5-2.5 MCG/ACT AERS Inhale 2 puffs into the lungs daily. 11/28/22   Valentina Shaggy, MD      Allergies    Levofloxacin, Toradol [ketorolac tromethamine], Tramadol, Levomenol, Other, Chamomile, Hctz [hydrochlorothiazide], Lisinopril, Losartan potassium, Nystatin, Penicillins, and Triamcinolone acetonide    Review of Systems   Review of Systems  Constitutional:  Positive for fatigue.  Gastrointestinal:  Positive for abdominal pain.  All other systems reviewed and are negative.   Physical Exam Updated Vital Signs BP 136/81   Pulse 85   Temp 98 F (36.7 C) (Oral)   Resp 20   Ht 5\' 4"  (1.626 m)   Wt 121.1 kg   LMP 10/09/2014   SpO2 98%   BMI 45.83 kg/m  Physical Exam Vitals and nursing note reviewed.  Constitutional:      General: She is not in acute  distress.    Appearance: She is well-developed. She is obese. She is not ill-appearing, toxic-appearing or diaphoretic.     Comments: Resting comfortably in bed  HENT:     Head: Normocephalic and atraumatic.  Eyes:     Conjunctiva/sclera: Conjunctivae normal.  Cardiovascular:     Rate and Rhythm: Normal rate and regular rhythm.     Heart sounds: No murmur heard. Pulmonary:     Effort: Pulmonary effort is normal. No respiratory distress.     Breath sounds: Normal breath sounds. No stridor. No wheezing, rhonchi or rales.  Abdominal:     General: Abdomen is protuberant. Bowel sounds are normal.     Palpations: Abdomen is soft.     Tenderness: There is no abdominal tenderness. There is no right CVA tenderness, left CVA tenderness or guarding. Negative signs include McBurney's sign.  Musculoskeletal:  General: No swelling.     Cervical back: Neck supple.  Skin:    General: Skin is warm and dry.     Capillary Refill: Capillary refill takes less than 2 seconds.  Neurological:     Mental Status: She is alert.  Psychiatric:        Mood and Affect: Mood normal.     ED Results / Procedures / Treatments   Labs (all labs ordered are listed, but only abnormal results are displayed) Labs Reviewed  COMPREHENSIVE METABOLIC PANEL - Abnormal; Notable for the following components:      Result Value   Glucose, Bld 168 (*)    Calcium 8.7 (*)    All other components within normal limits  RESP PANEL BY RT-PCR (RSV, FLU A&B, COVID)  RVPGX2  LIPASE, BLOOD  CBC  TSH  MAGNESIUM    EKG None  Radiology No results found.  Procedures Procedures    Medications Ordered in ED Medications  sodium chloride 0.9 % bolus 1,000 mL (0 mLs Intravenous Stopped 12/21/22 1807)    ED Course/ Medical Decision Making/ A&P                             Medical Decision Making Amount and/or Complexity of Data Reviewed Labs: ordered.  This patient presents to the ED for concern of fatigue, this  involves an extensive number of treatment options, and is a complaint that carries with it a high risk of complications and morbidity. The differential diagnosis of weakness includes but is not limited to neurologic causes (GBS, myasthenia gravis, CVA, MS, ALS, transverse myelitis, spinal cord injury, CVA, botulism, ) and other causes: ACS, Arrhythmia, syncope, orthostatic hypotension, sepsis, hypoglycemia, electrolyte disturbance, hypothyroidism, respiratory failure, symptomatic anemia, dehydration, heat injury, polypharmacy, malignancy.   Co morbidities that complicate the patient evaluation  significant history including but not limited to hypothyroidism, non-insulin-dependent type 2 diabetes, GERD, arthritis, fatty liver disease, anemia, anxiety, depression, seasonal allergies, asthma  My initial workup includes labs, EKG, IV fluids  Additional history obtained from: Nursing notes from this visit. Family daughter is present and provides a portion of the history  I ordered, reviewed and interpreted labs which include: CBC, CMP, lipase, urinalysis, respiratory panel, TSH, magnesium.  Hyperglycemia 168, labs otherwise reassuring  Cardiac Monitoring:  The patient was maintained on a cardiac monitor.  I personally viewed and interpreted the cardiac monitored which showed an underlying rhythm of: NSR  Afebrile, hemodynamically stable.  47 year old female presents to the ED for evaluation of generalized fatigue that began yesterday.  On exam, she appears overall very well.  she is obese with no other abnormalities noted.  Lab work was overall very reassuring.  She reported feeling significantly better after IV fluids.  Dehydration may have been the cause.  She was given return precautions.  She was instructed to follow-up with her primary care provider regarding her symptoms.  Stable at discharge.  At this time there does not appear to be any evidence of an acute emergency medical condition and the  patient appears stable for discharge with appropriate outpatient follow up. Diagnosis was discussed with patient who verbalizes understanding of care plan and is agreeable to discharge. I have discussed return precautions with patient and daughter who verbalizes understanding. Patient encouraged to follow-up with their PCP within 1 week. All questions answered.  Note: Portions of this report may have been transcribed using voice recognition software. Every effort was made to  ensure accuracy; however, inadvertent computerized transcription errors may still be present.        Final Clinical Impression(s) / ED Diagnoses Final diagnoses:  Other fatigue    Rx / DC Orders ED Discharge Orders     None         Roylene Reason, Hershal Coria 12/21/22 1814    Fredia Sorrow, MD 12/23/22 [phone]

## 2022-12-21 NOTE — ED Triage Notes (Signed)
Pt presents with fatigue, lightheaded, loose stool, stomach cramps that started yesterday. Pt had a virtual visit and was referred to the ED.

## 2022-12-21 NOTE — Progress Notes (Signed)
Virtual Visit Consent   Heather Fry, you are scheduled for a virtual visit with a [facility] provider today. Just as with appointments in the office, your consent must be obtained to participate. Your consent will be active for this visit and any virtual visit you may have with one of our providers in the next 365 days. If you have a MyChart account, a copy of this consent can be sent to you electronically.  As this is a virtual visit, video technology does not allow for your provider to perform a traditional examination. This may limit your provider's ability to fully assess your condition. If your provider identifies any concerns that need to be evaluated in person or the need to arrange testing (such as labs, EKG, etc.), we will make arrangements to do so. Although advances in technology are sophisticated, we cannot ensure that it will always work on either your end or our end. If the connection with a video visit is poor, the visit may have to be switched to a telephone visit. With either a video or telephone visit, we are not always able to ensure that we have a secure connection.  By engaging in this virtual visit, you consent to the provision of healthcare and authorize for your insurance to be billed (if applicable) for the services provided during this visit. Depending on your insurance coverage, you may receive a charge related to this service.  I need to obtain your verbal consent now. Are you willing to proceed with your visit today? KLOE PAGETT has provided verbal consent on 12/21/2022 for a virtual visit (video or telephone). Heather Fry, [place]  Date: 12/21/2022 3:30 PM  Virtual Visit via Video Note   I, Heather Fry, connected with  Heather Fry  ([id number], 05-27-1976) on 12/21/22 at  3:30 PM EDT by a video-enabled telemedicine application and verified that I am speaking with the correct person using two identifiers.  Location: Patient: Virtual Visit Location  Patient: Home Provider: Virtual Visit Location Provider: Home Office   I discussed the limitations of evaluation and management by telemedicine and the availability of in person appointments. The patient expressed understanding and agreed to proceed.    History of Present Illness: Heather Fry is a 47 y.o. who identifies as a female who was assigned female at birth, and is being seen today for multiple concerns.  Patient reports that she has fatigue, poor appetite, lethargy, HA, feeling of flushing. Sx getting worse with time. BP is normal.  Would like blood work for further evaluation Feels dehydrated, mouth is really dry  Hx of DM and multiple medical issues   HPI: HPI  Problems:  Patient Active Problem List   Diagnosis Date Noted   Moderate persistent asthma, uncomplicated 0000000   Seasonal and perennial allergic rhinitis 06/08/2022   Multiple drug allergies 06/08/2022   Generalized abdominal pain 05/09/2022   Allergic reaction caused by a drug 04/04/2022   Oral thrush 04/04/2022   Type 2 diabetes mellitus with hyperglycemia ([place]) 04/04/2022   Fatty liver 02/21/2022   FH: colon cancer in relative diagnosed at >52 years old 02/21/2022   Daytime somnolence 01/11/2022   Morbid obesity ([place]) 01/11/2022   Psychophysiologic insomnia 01/11/2022   Dyslipidemia 10/15/2021   DOE (dyspnea on exertion) 10/15/2021   Essential hypertension 10/15/2021   Precordial chest pain 10/15/2021   Hypocalcemia 08/08/2021   Diabetes ([place]) 05/30/2021   Disc disease, degenerative, cervical 04/19/2021   Right carpal tunnel syndrome 04/19/2021  Irritable bowel syndrome 04/17/2021   Weight gain 12/20/2020   Asthma without status asthmaticus 07/29/2020   History of Graves' disease 07/29/2020   Postoperative hypothyroidism 07/29/2020   Diarrhea 05/08/2020   Idiopathic peripheral neuropathy 04/18/2020   Myelopathy ([place]) 04/18/2020   OSA (obstructive sleep apnea) 04/18/2020   RLS (restless  legs syndrome) 04/18/2020   Sensory disturbance 01/20/2020   Atypical chest pain 01/20/2020   Obesity, Class III, BMI 40-49.9 (morbid obesity) ([place]) 01/20/2020   Weakness 01/19/2020   Hyperglycemia 10/04/2019   Myalgia 09/05/2019   Vitamin B 12 deficiency 07/28/2019   Right sided numbness 06/29/2019   Constipation 06/15/2019   Elevated lipase 06/15/2019   Hypomagnesemia 03/29/2019   Leukocytosis 03/29/2019   Pain of upper abdomen 02/10/2019   Nausea without vomiting 02/10/2019   Tingling of face 11/30/2018   Hot flashes 11/30/2018   Vitamin D deficiency 10/06/2018   Gastroesophageal reflux disease 01/28/2017   Rectal bleeding 01/28/2017   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   Acquired hypothyroidism 04/14/2016   Gastric polyp    RUQ abdominal pain 09/06/2014   Excessive or frequent menstruation 01/13/2013   Depression     Allergies:  Allergies  Allergen Reactions   Levofloxacin Other (See Comments)    Pt can not have due to taking Lexapro    Toradol [Ketorolac Tromethamine] Anaphylaxis and Hives   Tramadol Hives   Levomenol     Other reaction(s): Other   Other Other (See Comments)   Chamomile Nausea And Vomiting    Lexapro  Pt states this is incorrect   Hctz [Hydrochlorothiazide] Other (See Comments)    Dehydration   Lisinopril Nausea Only   Losartan Potassium Other (See Comments)    GI upset   Nystatin     oral   Penicillins Other (See Comments)    Loopy, childhood allergy  ALL CILLINS     Triamcinolone Acetonide     Elevated blood pressure, caused diabetes, changed tyroid electrolytes Patient has no tyroid     Medications:  Current Outpatient Medications:    acarbose (PRECOSE) 100 MG tablet, Take 100 mg by mouth daily as needed (blood glucose above 160)., Disp: , Rfl:    acetaminophen (TYLENOL) 500 MG tablet, Take 1,000 mg by mouth daily as needed for moderate pain., Disp: , Rfl:    albuterol (VENTOLIN HFA) 108 (90 Base) MCG/ACT inhaler, Inhale 2 puffs into the  lungs every 6 (six) hours as needed., Disp: 18 g, Rfl: 11   Azelastine HCl 0.15 % SOLN, Place 2 sprays into both nostrils 2 (two) times daily., Disp: 30 mL, Rfl: 5   beclomethasone (QVAR REDIHALER) 80 MCG/ACT inhaler, Inhale 2 puffs into the lungs 2 (two) times daily., Disp: 1 each, Rfl: 0   cetirizine (ZYRTEC) 10 MG tablet, Take 10 mg by mouth daily., Disp: , Rfl:    Cholecalciferol (VITAMIN D3) 125 MCG (5000 UT) CAPS, Take 1 capsule (5,000 Units total) by mouth daily. (Patient taking differently: Take 10,000 Units by mouth daily.), Disp: 30 capsule, Rfl: 11   Continuous Blood Gluc Sensor (DEXCOM G7 SENSOR) MISC, USE AS DIRECTED EVERY 10 DAYS, Disp: , Rfl:    dicyclomine (BENTYL) 10 MG capsule, Take 1 capsule (10 mg total) by mouth every 8 (eight) hours as needed for spasms. Take 1 capsule (10 mg total) by mouth up to 3 times daily before meals and at bedtime for abdominal cramping or diarrhea.  Hold in the setting of constipation., Disp: 60 capsule, Rfl: 11   EPINEPHrine 0.3 mg/0.3  mL IJ SOAJ injection, Inject 0.3 mg into the muscle as needed for anaphylaxis., Disp: 2 each, Rfl: 1   EQUATE STOOL SOFTENER 100 MG capsule, Take 100 mg by mouth 2 (two) times daily as needed for moderate constipation., Disp: , Rfl:    escitalopram (LEXAPRO) 20 MG tablet, Take 1 tablet (20 mg total) by mouth at bedtime., Disp: 30 tablet, Rfl: 5   FIBER PO, Take by mouth., Disp: , Rfl:    fluticasone (FLONASE) 50 MCG/ACT nasal spray, Place 2 sprays into both nostrils at bedtime as needed for allergies., Disp: , Rfl:    glimepiride (AMARYL) 1 MG tablet, Take 1 mg by mouth daily as needed (blood glucose above 200)., Disp: , Rfl:    glucose blood (ACCU-CHEK AVIVA PLUS) test strip, 1 each by Other route daily. And lancets 1/day, Disp: 100 each, Rfl: 3   ipratropium (ATROVENT) 0.02 % nebulizer solution, Take 2.5 mLs (0.5 mg total) by nebulization every 6 (six) hours as needed., Disp: 150 mL, Rfl: 2   Magnesium 250 MG TABS, Take  250 mg by mouth at bedtime. , Disp: , Rfl:    meclizine (ANTIVERT) 25 MG tablet, Take 25 mg by mouth 4 (four) times daily as needed for dizziness., Disp: , Rfl:    NON FORMULARY, at bedtime. CPAP at night, Disp: , Rfl:    omeprazole (PRILOSEC) 40 MG capsule, Take 1 capsule (40 mg total) by mouth 2 (two) times daily before a meal., Disp: 60 capsule, Rfl: 11   ondansetron (ZOFRAN ODT) 4 MG disintegrating tablet, Take 1 tablet (4 mg total) by mouth every 8 (eight) hours as needed for nausea or vomiting., Disp: 40 tablet, Rfl: 11   rosuvastatin (CRESTOR) 10 MG tablet, Take 10 mg by mouth every Saturday., Disp: , Rfl:    Simethicone 250 MG CAPS, Take 500 mg by mouth at bedtime., Disp: , Rfl:    sucralfate (CARAFATE) 1 GM/10ML suspension, TAKE 10 ML BY MOUTH  4 TIMES DAILY AS NEEDED, Disp: 828 mL, Rfl: 0   SYNTHROID 137 MCG tablet, Take 137 mcg by mouth daily before breakfast., Disp: , Rfl:    tezepelumab-ekko (TEZSPIRE) 210 MG/1.91ML syringe, Inject 1.91 mLs (210 mg total) into the skin every 28 (twenty-eight) days. (Patient not taking: Reported on 10/22/2022), Disp: 1.91 mL, Rfl: 11   Tiotropium Bromide-Olodaterol (STIOLTO RESPIMAT) 2.5-2.5 MCG/ACT AERS, Inhale 2 puffs into the lungs daily., Disp: 4 g, Rfl: 5  Observations/Objective: Patient is well-developed, well-nourished in no acute distress.  Resting comfortably  at home.  Head is normocephalic, atraumatic.  No labored breathing.  Speech is clear and coherent with logical content.  Patient is alert and oriented at baseline.   Assessment and Plan: 1. Malaise and fatigue Unclear etiology Discussed seeing urgent care vs ER for further evaluation to determine etiology and next steps She is agreeable to plan  Follow Up Instructions: I discussed the assessment and treatment plan with the patient. The patient was provided an opportunity to ask questions and all were answered. The patient agreed with the plan and demonstrated an understanding of  the instructions.  A copy of instructions were sent to the patient via MyChart unless otherwise noted below.    The patient was advised to call back or seek an in-person evaluation if the symptoms worsen or if the condition fails to improve as anticipated.  Time:  I spent 5-10 minutes with the patient via telehealth technology discussing the above problems/concerns.    Heather Fry, [place]

## 2023-02-04 ENCOUNTER — Ambulatory Visit: Payer: Medicaid Other | Admitting: Cardiology

## 2023-02-06 NOTE — Telephone Encounter (Signed)
Patient called back today for an update & I let her know Trayce's message. She is going to call the pharmacy to see if they have it on file.

## 2023-03-02 ENCOUNTER — Other Ambulatory Visit: Payer: Self-pay | Admitting: Allergy & Immunology

## 2023-03-11 ENCOUNTER — Ambulatory Visit: Payer: Medicaid Other | Admitting: Allergy & Immunology

## 2023-03-23 ENCOUNTER — Other Ambulatory Visit: Payer: Self-pay | Admitting: Allergy & Immunology

## 2023-03-31 ENCOUNTER — Encounter (HOSPITAL_COMMUNITY): Payer: Self-pay

## 2023-03-31 ENCOUNTER — Emergency Department (HOSPITAL_COMMUNITY)
Admission: EM | Admit: 2023-03-31 | Discharge: 2023-04-01 | Disposition: A | Discharge status: Home / Self Care | Payer: Medicaid Other | Attending: Emergency Medicine | Admitting: Emergency Medicine

## 2023-03-31 DIAGNOSIS — T7840XA Allergy, unspecified, initial encounter: Secondary | ICD-10-CM | POA: Diagnosis present

## 2023-03-31 DIAGNOSIS — E039 Hypothyroidism, unspecified: Secondary | ICD-10-CM | POA: Diagnosis not present

## 2023-03-31 DIAGNOSIS — Z87891 Personal history of nicotine dependence: Secondary | ICD-10-CM | POA: Diagnosis not present

## 2023-03-31 DIAGNOSIS — E119 Type 2 diabetes mellitus without complications: Secondary | ICD-10-CM | POA: Diagnosis not present

## 2023-03-31 LAB — CBG MONITORING, ED: Glucose-Capillary: 145 mg/dL — ABNORMAL HIGH (ref 70–99)

## 2023-03-31 MED ORDER — DIPHENHYDRAMINE HCL 50 MG/ML IJ SOLN
50.0000 mg | Freq: Once | INTRAMUSCULAR | Status: AC
  Administered 2023-03-31: 25 mg via INTRAVENOUS
  Filled 2023-03-31: qty 1

## 2023-03-31 MED ORDER — IPRATROPIUM-ALBUTEROL 0.5-2.5 (3) MG/3ML IN SOLN
3.0000 mL | Freq: Once | RESPIRATORY_TRACT | Status: AC
  Administered 2023-03-31: 3 mL via RESPIRATORY_TRACT
  Filled 2023-03-31: qty 3

## 2023-03-31 MED ORDER — METHYLPREDNISOLONE SODIUM SUCC 125 MG IJ SOLR
125.0000 mg | Freq: Once | INTRAMUSCULAR | Status: AC
  Administered 2023-03-31: 125 mg via INTRAVENOUS
  Filled 2023-03-31: qty 2

## 2023-03-31 MED ORDER — FAMOTIDINE IN NACL 20-0.9 MG/50ML-% IV SOLN
20.0000 mg | Freq: Once | INTRAVENOUS | Status: AC
  Administered 2023-03-31: 20 mg via INTRAVENOUS
  Filled 2023-03-31: qty 50

## 2023-03-31 NOTE — ED Triage Notes (Addendum)
Pt reports getting "stung by wasp in R  wrist area." Face flushed, chest tightness, throat tightness." Self administered albuterol inhaler and epi pen. Reports having a hx of Graves disease and asthma. Pt unaware that she had an allergy to bees/wasp until today's reaction.

## 2023-03-31 NOTE — Progress Notes (Signed)
Pt requesting CPAP, states she can not sleep without it.

## 2023-03-31 NOTE — Progress Notes (Signed)
   03/31/23 2354  BiPAP/CPAP/SIPAP  $ Face Mask Small Yes  BiPAP/CPAP/SIPAP Pt Type Adult  BiPAP/CPAP/SIPAP DREAMSTATIOND  Mask Type Nasal mask  Mask Size Small  FiO2 (%) 21 %  Patient Home Equipment No  Auto Titrate Yes (5-20)

## 2023-03-31 NOTE — ED Provider Notes (Signed)
[facility]-EMERGENCY DEPT [facility] Emergency Department Provider Note MRN:  [id number]  Arrival date & time: 04/01/23     Chief Complaint   Allergic Reaction   History of Present Illness   Heather Fry is a 47 y.o. year-old female with a history of diabetes, Graves' disease presenting to the ED with chief complaint of allergic reaction.  Stung by a bee or wasp earlier today.  Began having rash, shortness of breath, sensation of throat closure.  Gave herself an epi shot.  Here for evaluation.  Symptoms are improved.  Still feeling some tightness in the chest described as a wheezing feeling.  History of asthma.  Review of Systems  A thorough review of systems was obtained and all systems are negative except as noted in the HPI and PMH.   Patient's Health History    Past Medical History:  Diagnosis Date   Allergic asthma    Anemia    Anxiety    Arthritis    Depression    Diabetes mellitus without complication (HCC)    Diverticulosis    Fatty liver    moderate to severe   GERD (gastroesophageal reflux disease)    Graves disease    Headache    tension, ocular migraines   Hypothyroidism    Internal hemorrhoids    Sleep apnea    Thyroid disease    Uterine fibroid     Past Surgical History:  Procedure Laterality Date   ABDOMINAL HYSTERECTOMY     Per patient, uterine fibroids.   ABDOMINAL HYSTERECTOMY     CARPAL TUNNEL RELEASE Right 06/11/2021   Procedure: Right Carpel tunnel release;  Surgeon: Bedelia Person, MD;  Location: [facility] OR;  Service: Neurosurgery;  Laterality: Right;  RM 18   CHOLECYSTECTOMY N/A 11/06/2014   Procedure: LAPAROSCOPIC CHOLECYSTECTOMY;  Surgeon: Dalia Heading, MD;  Location: [facility] ORS;  Service: General;  Laterality: N/A;   COLONOSCOPY N/A 03/04/2017   Dr. Jena Gauss: Hemorrhoids, diverticulosis, benign polyp without adenomatous changes.  Next colonoscopy 10 years   COLONOSCOPY WITH PROPOFOL N/A 08/28/2022   Procedure: COLONOSCOPY WITH PROPOFOL;   Surgeon: Corbin Ade, MD;  Location: [facility] ENDO SUITE;  Service: Endoscopy;  Laterality: N/A;  9:00 am   DILATION AND CURETTAGE OF UTERUS     ESOPHAGOGASTRODUODENOSCOPY N/A 10/02/2014   Dr. Jena Gauss: fundic gland polyps, hiatal hernia   ESOPHAGOGASTRODUODENOSCOPY  07/2020   [facility] ; normal esophagus biopsied, benign-appearing gastric polyps biopsied, gastric biopsies obtained to evaluate for H. pylori, normal duodenum.  Pathology with mild chronic gastritis, negative for H. pylori, fundic gland polyps, esophageal biopsy benign.   MOUTH SURGERY     extraction of teeth   POLYPECTOMY  03/04/2017   Procedure: POLYPECTOMY;  Surgeon: Corbin Ade, MD;  Location: [facility] ENDO SUITE;  Service: Endoscopy;;  colon   RIGHT OOPHORECTOMY  07/2021   THYROIDECTOMY N/A 09/30/2018   Procedure: TOTAL THYROIDECTOMY;  Surgeon: Darnell Level, MD;  Location: [facility] ORS;  Service: General;  Laterality: N/A;   TUBAL LIGATION      Family History  Problem Relation Age of Onset   Allergic rhinitis Mother    Uterine cancer Mother        ?cervical cancer   Allergic rhinitis Father    Colon cancer Father 76   Hypertension Father    Diabetes Father    Colon polyps Father        age less than 48   Uterine cancer Sister  suicide   Colon cancer Paternal Aunt 66       also had skin cancer, ovarian?   Depression Other    Thyroid disease Neg Hx    Pancreatic cancer Neg Hx     Social History   Socioeconomic History   Marital status: Married    Spouse name: Tomeika Enriquez   Number of children: Not on file   Years of education: 12   Highest education level: 12th grade  Occupational History   Not on file  Tobacco Use   Smoking status: Former    Packs/day: 1.00    Years: 5.00    Additional pack years: 0.00    Total pack years: 5.00    Types: Cigarettes    Quit date: 11/03/1997    Years since quitting: 25.4    Passive exposure: Current   Smokeless tobacco: Never   Tobacco comments:    1 pack 1  week    Smoking Cessation Offered  Vaping Use   Vaping Use: Never used  Substance and Sexual Activity   Alcohol use: No    Alcohol/week: 0.0 standard drinks of alcohol   Drug use: No   Sexual activity: Yes    Partners: Male    Birth control/protection: Surgical  Other Topics Concern   Not on file  Social History Narrative   Not on file   Social Determinants of Health   Financial Resource Strain: Low Risk  (08/05/2022)   Overall Financial Resource Strain (CARDIA)    Difficulty of Paying Living Expenses: Not hard at all  Food Insecurity: No Food Insecurity (08/05/2022)   Hunger Vital Sign    Worried About Running Out of Food in the Last Year: Never true    Ran Out of Food in the Last Year: Never true  Transportation Needs: No Transportation Needs (08/05/2022)   PRAPARE - Administrator, Civil Service (Medical): No    Lack of Transportation (Non-Medical): No  Physical Activity: Inactive (08/05/2022)   Exercise Vital Sign    Days of Exercise per Week: 0 days    Minutes of Exercise per Session: 0 min  Stress: No Stress Concern Present (08/05/2022)   [employer] of Occupational Health - Occupational Stress Questionnaire    Feeling of Stress : Only a little  Social Connections: Socially Integrated (08/05/2022)   Social Connection and Isolation Panel [NHANES]    Frequency of Communication with Friends and Family: More than three times a week    Frequency of Social Gatherings with Friends and Family: More than three times a week    Attends Religious Services: More than 4 times per year    Active Member of [employer] or Organizations: Yes    Attends Banker Meetings: More than 4 times per year    Marital Status: Married  Catering manager Violence: Not At Risk (08/05/2022)   Humiliation, Afraid, Rape, and Kick questionnaire    Fear of Current or Ex-Partner: No    Emotionally Abused: No    Physically Abused: No    Sexually Abused: No     Physical Exam    Vitals:   03/31/23 2332 04/01/23 0045  BP:  (!) 146/80  Pulse:  91  Resp:  20  Temp:  98.1 F (36.7 C)  SpO2: 99% 96%    CONSTITUTIONAL: Chronically ill-appearing, NAD NEURO/PSYCH:  Alert and oriented x 3, no focal deficits EYES:  eyes equal and reactive ENT/NECK:  no LAD, no JVD CARDIO: Regular rate, well-perfused, normal  S1 and S2 PULM:  CTAB no wheezing or rhonchi GI/GU:  non-distended, non-tender MSK/SPINE:  No gross deformities, no edema SKIN:  no rash, atraumatic   *Additional and/or pertinent findings included in MDM below  Diagnostic and Interventional Summary    EKG Interpretation Date/Time:  Tuesday March 31 2023 23:17:47 EDT Ventricular Rate:  97 PR Interval:  163 QRS Duration:  76 QT Interval:  382 QTC Calculation: 486 R Axis:   48  Text Interpretation: Sinus rhythm Low voltage, precordial leads Borderline prolonged QT interval Confirmed by Kennis Carina [phone]) on 03/31/2023 11:59:24 PM       Labs Reviewed  CBG MONITORING, ED - Abnormal; Notable for the following components:      Result Value   Glucose-Capillary 145 (*)    All other components within normal limits    No orders to display    Medications  diphenhydrAMINE (BENADRYL) injection 50 mg (25 mg Intravenous Given 03/31/23 2228)  methylPREDNISolone sodium succinate (SOLU-MEDROL) 125 mg/2 mL injection 125 mg (125 mg Intravenous Given 03/31/23 2230)  famotidine (PEPCID) IVPB 20 mg premix (0 mg Intravenous Stopped 03/31/23 2304)  ipratropium-albuterol (DUONEB) 0.5-2.5 (3) MG/3ML nebulizer solution 3 mL (3 mLs Nebulization Given 03/31/23 2331)     Procedures  /  Critical Care Procedures  ED Course and Medical Decision Making  Initial Impression and Ddx History seems consistent with allergic reaction, possibly anaphylaxis.  Seems much improved, mild wheezing/tightness felt by patient, no increased work of breathing however.  Providing breathing treatment, screening EKG, will monitor for 4 hours from  the time of the EpiPen.  Past medical/surgical history that increases complexity of ED encounter: Asthma  Interpretation of Diagnostics I personally reviewed the EKG and my interpretation is as follows: Sinus rhythm without concerning ischemic features    Patient Reassessment and Ultimate Disposition/Management     Feeling better after breathing treatment, no rebound symptoms, appropriate for discharge.  Patient management required discussion with the following services or consulting groups:  None  Complexity of Problems Addressed Acute illness or injury that poses threat of life of bodily function  Additional Data Reviewed and Analyzed Further history obtained from: Further history from spouse/family member  Additional Factors Impacting ED Encounter Risk Prescriptions and Consideration of hospitalization  Elmer Sow. Pilar Plate, MD [facility] Emergency Medicine [facility] [email or web address]  Final Clinical Impressions(s) / ED Diagnoses     ICD-10-CM   1. Allergic reaction, initial encounter  T78.40XA       ED Discharge Orders          Ordered    predniSONE (DELTASONE) 20 MG tablet  Daily        04/01/23 0035    EPINEPHrine 0.3 mg/0.3 mL IJ SOAJ injection  As needed        04/01/23 0035    diphenhydrAMINE (BENADRYL) 25 MG tablet  Every 6 hours        04/01/23 0035             Discharge Instructions Discussed with and Provided to Patient:     Discharge Instructions      You were evaluated in the Emergency Department and after careful evaluation, we did not find any emergent condition requiring admission or further testing in the hospital.  Your exam/testing today is overall reassuring.  Take the prednisone medication as directed starting morning of 7-4.  Use the Benadryl as needed for itch or rash.  Use the EpiPen for throat swelling as we discussed.  Follow-up with  an allergist.  Please return to the Emergency Department if you experience  any worsening of your condition.   Thank you for allowing [place] to be a part of your care.       Sabas Sous, MD 04/01/23 [id number]

## 2023-04-01 MED ORDER — EPINEPHRINE 0.3 MG/0.3ML IJ SOAJ: 0.3000 mg | INTRAMUSCULAR | 1 refills | Status: DC | PRN

## 2023-04-01 MED ORDER — DIPHENHYDRAMINE HCL 25 MG PO TABS: 25.0000 mg | ORAL_TABLET | Freq: Four times a day (QID) | ORAL | 0 refills | Status: DC

## 2023-04-01 MED ORDER — PREDNISONE 20 MG PO TABS: 40.0000 mg | ORAL_TABLET | Freq: Every day | ORAL | 0 refills | Status: AC

## 2023-04-01 NOTE — Discharge Instructions (Signed)
You were evaluated in the Emergency Department and after careful evaluation, we did not find any emergent condition requiring admission or further testing in the hospital.  Your exam/testing today is overall reassuring.  Take the prednisone medication as directed starting morning of 7-4.  Use the Benadryl as needed for itch or rash.  Use the EpiPen for throat swelling as we discussed.  Follow-up with an allergist.  Please return to the Emergency Department if you experience any worsening of your condition.   Thank you for allowing [place] to be a part of your care.

## 2023-04-06 ENCOUNTER — Emergency Department (HOSPITAL_COMMUNITY)
Admission: EM | Admit: 2023-04-06 | Discharge: 2023-04-06 | Discharge status: Against Medical Advice | Payer: Medicaid Other | Attending: Emergency Medicine | Admitting: Emergency Medicine

## 2023-04-06 DIAGNOSIS — E860 Dehydration: Secondary | ICD-10-CM | POA: Insufficient documentation

## 2023-04-06 DIAGNOSIS — Z5321 Procedure and treatment not carried out due to patient leaving prior to being seen by health care provider: Secondary | ICD-10-CM | POA: Diagnosis not present

## 2023-04-08 ENCOUNTER — Other Ambulatory Visit: Payer: Self-pay | Admitting: Allergy & Immunology

## 2023-04-15 ENCOUNTER — Encounter: Payer: Self-pay | Admitting: Gastroenterology

## 2023-04-15 ENCOUNTER — Ambulatory Visit (INDEPENDENT_AMBULATORY_CARE_PROVIDER_SITE_OTHER): Payer: Medicaid Other | Admitting: Gastroenterology

## 2023-04-15 VITALS — BP 130/85 | HR 85 | Temp 98.6°F | Ht 64.0 in | Wt 275.4 lb

## 2023-04-15 DIAGNOSIS — K760 Fatty (change of) liver, not elsewhere classified: Secondary | ICD-10-CM

## 2023-04-15 DIAGNOSIS — K219 Gastro-esophageal reflux disease without esophagitis: Secondary | ICD-10-CM | POA: Diagnosis not present

## 2023-04-15 DIAGNOSIS — K582 Mixed irritable bowel syndrome: Secondary | ICD-10-CM | POA: Diagnosis not present

## 2023-04-15 MED ORDER — PANTOPRAZOLE SODIUM 40 MG PO TBEC: 40.0000 mg | DELAYED_RELEASE_TABLET | Freq: Two times a day (BID) | ORAL | 3 refills | Status: DC

## 2023-04-15 NOTE — Patient Instructions (Addendum)
It was very nice to see you today!  Sorry you are not feeling well.  Please stop omeprazole.  Start pantoprazole 40 mg twice daily before breakfast and evening meal.  It does look like Dexilant is on your medication list but will require a prior authorization.  Next week, we will try to send in Dexilant to see if we can get it covered.  In the meantime try pantoprazole to see if this works.  You can continue sucralfate 3-4 times daily for now as needed.  Plan to use short-term.  You can use dicyclomine (Bentyl) as needed for diarrhea or cramping.  Anytime you are ready, we can consider an upper endoscopy.  Just keep [place] posted on how you are doing with medication change.  If you continue to have a lot of issues with reflux and upper abdominal pain, we would advise upper endoscopy in the next several weeks or so.  Otherwise we can do later this year. If we don't need to do an upper endoscopy in the short term, please make a follow up with [place] in 3-4 months.    It was a pleasure to see you today. I want to create trusting relationships with patients and provide genuine, compassionate, and quality care. I truly value your feedback, so please be on the lookout for a survey regarding your visit with me today. I appreciate your time in completing this!

## 2023-04-15 NOTE — H&P (View-Only) (Signed)
GI Office Note    Referring Provider: Elfredia Nevins, MD Primary Care Physician:  Heather Nevins, MD  Primary Gastroenterologist: Heather Sessions, MD   Chief Complaint   Chief Complaint  Patient presents with   Follow-up    Pt thinks its time to switch her GERD meds.    History of Present Illness   Heather Fry is a 47 y.o. female presenting today for follow up. H/o mixed IBS, GERD, chronic RUQ pain, fatty liver. Last seen in 09/2022.   Seen in ED at [facility] for epigastric pain radiating into her chest. BP OK at home. She ate a couple of Altoid peppermints and took couple of doses of sucralfate but symptoms did not settle down. Decided to go to ED. May have created a panic attack. While in the ED, Sodium 138, potassium 3.9, BUN 9, creatinine 0.66, glucose 185, albumin 1.9, total bilirubin 0.2, alkaline phosphatase 78, AST 28, ALT 49, lipase 67, white blood cell count 11,200, hemoglobin 13.4, platelets 292,000, troponin 5, urine drug screen negative, urinalysis negative. Ended up signing out AMA, declined CT A/P as she was getting very sleepy and needed to be able to drive her and her daughter home.    She feels like is time to switch her PPI.  Typically ends up switching out pantoprazole or omeprazole every 6 months or so because they seem to lose efficacy.  Previously failed Nexium, Zegerid, Tagamet.  Has never tried Dexilant or Aciphex.  States her PCP prescribed Dexilant recently but cannot get it covered.  Requires a prior authorization.  Recently dealing with undermedicated thyroid issue.  At some point she was prescribed generic levothyroxine because of insurance coverage.  Typically she has required brand name due to absorption issues.  She is finally back on track with that but during this timeframe she had flare of her GI symptoms.  Also recently seen in the ED after a bee sting requiring use of her EpiPen.  Received IV steroids.  She was prescribed oral steroids but she did  not take because of her stomach issues.    Trying to lose weight because she needs knee replacement surgery.  Started Ozempic recently and after 1 shot she had significant abdominal pain so she does not want to try continuing.  IBS seems to be okay.  Recently did have a flare with diarrhea but resolved after use of Bentyl.  No melena, rectal bleeding.  No vomiting but she has had some nausea.  No dysphagia.  Sleeps in an adjustable bed, reclined position because of reflux.  Colonoscopy 07/2022: -diverticulosis -redundant colon -screening colonoscopy in 5 years due to positive FH of CRC.   Prior work up:   Colonoscopy in 2018 unremarkable.     EGD from November 2021 at [facility], gastritis, biopsies negative for H. pylori, fundic gland polyps.   CT pancreatic protocol completed August 2022 with no pancreatic abnormalities.  She had diffuse hepatic steatosis and a focal hyperdense nonenhancing nodularity along the posterior aspect of the right lobe of the liver measuring 11 mm that appears stable at least since 2019 and consistent with benign/indolent process.  MRI abdomen with and without contrast August 2022 revealed 1 cm subcapsular hemorrhagic cyst in the posterior right hepatic lobe, moderate to severe hepatic steatosis, incompletely visualized cystic lesion in the upper right pelvis measuring 8 cm with inability to exclude ovarian neoplasm.  MRI pelvis with and without contrast completed September 2022 with large cystic lesion associated  with the right ovary measuring up to 9.7 cm.  Differential including borderline tumor or large cyst with involution of adjacent smaller cyst.  GYN evaluation recommended.   Patient completed laparoscopic right oophorectomy November 2022.   In February 2023, sed rate 47, CRP 28.  Patient denies any autoimmune diagnosis.  In May 2022 sed rate 39, CRP 25.5.  RA screen negative, anti-CCP less than 5, complement C4 33, normal.  Complement C3  177 normal.  Sjogren's antibody less than 0.2 normal, ANA negative, adolase S 5.3, CK 68 normal. Wbc 8800, hemoglobin 12.9, platelets 281,000, glucose 155, BUN 10, creatinine 0.79, albumin 4.6, total bilirubin less than 0.2, alk phos 73, AST 22, ALT 28.   She has had elevated lipase in the 200 range intermittently dating back as far as 2019.  Highest lipase of 257 in July 2022.  LFTs have been normal aside from slight elevation of AST at 41 in June 2022. History of cholecystectomy.  States that she was given steroids once and it caused pancreatitis.  Medications   Current Outpatient Medications  Medication Sig Dispense Refill   acarbose (PRECOSE) 100 MG tablet Take 100 mg by mouth daily as needed (blood glucose above 160).     acetaminophen (TYLENOL) 500 MG tablet Take 1,000 mg by mouth daily as needed for moderate pain.     albuterol (VENTOLIN HFA) 108 (90 Base) MCG/ACT inhaler Inhale 2 puffs into the lungs every 6 (six) hours as needed. 18 g 11   Azelastine HCl 0.15 % SOLN Place 2 sprays into both nostrils 2 (two) times daily. 30 mL 5   beclomethasone (QVAR REDIHALER) 80 MCG/ACT inhaler Inhale 2 puffs into the lungs 2 (two) times daily. 1 each 0   cetirizine (ZYRTEC) 10 MG tablet Take 10 mg by mouth daily.     Cholecalciferol (VITAMIN D3) 125 MCG (5000 UT) CAPS Take 1 capsule (5,000 Units total) by mouth daily. (Patient taking differently: Take 10,000 Units by mouth daily.) 30 capsule 11   Continuous Blood Gluc Sensor (DEXCOM G7 SENSOR) MISC USE AS DIRECTED EVERY 10 DAYS     dicyclomine (BENTYL) 10 MG capsule Take 1 capsule (10 mg total) by mouth every 8 (eight) hours as needed for spasms. Take 1 capsule (10 mg total) by mouth up to 3 times daily before meals and at bedtime for abdominal cramping or diarrhea.  Hold in the setting of constipation. 60 capsule 11   diphenhydrAMINE (BENADRYL) 25 MG tablet Take 1 tablet (25 mg total) by mouth every 6 (six) hours. 20 tablet 0   EPINEPHrine 0.3 mg/0.3  mL IJ SOAJ injection Inject 0.3 mg into the muscle as needed for anaphylaxis. 2 each 1   EPINEPHrine 0.3 mg/0.3 mL IJ SOAJ injection Inject 0.3 mg into the muscle as needed for anaphylaxis. 1 each 1   EQUATE STOOL SOFTENER 100 MG capsule Take 100 mg by mouth 2 (two) times daily as needed for moderate constipation.     escitalopram (LEXAPRO) 20 MG tablet Take 1 tablet (20 mg total) by mouth at bedtime. 30 tablet 5   FIBER PO Take by mouth.     fluticasone (FLONASE) 50 MCG/ACT nasal spray Place 2 sprays into both nostrils at bedtime as needed for allergies.     glimepiride (AMARYL) 1 MG tablet Take 1 mg by mouth daily as needed (blood glucose above 200).     glucose blood (ACCU-CHEK AVIVA PLUS) test strip 1 each by Other route daily. And lancets 1/day 100 each 3  insulin glargine (LANTUS) 100 UNIT/ML injection Inject 20 Units into the skin daily.     ipratropium (ATROVENT) 0.02 % nebulizer solution INHALE 1 VIAL IN NEBULIZER EVERY 6 HOURS AS NEEDED 180 mL 0   Magnesium 250 MG TABS Take 250 mg by mouth at bedtime.      meclizine (ANTIVERT) 25 MG tablet Take 25 mg by mouth 4 (four) times daily as needed for dizziness.     NON FORMULARY at bedtime. CPAP at night     omeprazole (PRILOSEC) 40 MG capsule Take 1 capsule (40 mg total) by mouth 2 (two) times daily before a meal. 60 capsule 11   ondansetron (ZOFRAN ODT) 4 MG disintegrating tablet Take 1 tablet (4 mg total) by mouth every 8 (eight) hours as needed for nausea or vomiting. 40 tablet 11   rosuvastatin (CRESTOR) 10 MG tablet Take 10 mg by mouth every Saturday.     Simethicone 250 MG CAPS Take 500 mg by mouth at bedtime.     sucralfate (CARAFATE) 1 GM/10ML suspension TAKE 10 ML BY MOUTH  4 TIMES DAILY AS NEEDED 828 mL 0   SYNTHROID 137 MCG tablet Take 137 mcg by mouth daily before breakfast.     No current facility-administered medications for this visit.    Allergies   Allergies as of 04/15/2023 - Review Complete 04/15/2023  Allergen  Reaction Noted   Bee venom Shortness Of Breath, Swelling, and Palpitations 03/31/2023   Levofloxacin Other (See Comments) 03/03/2017   Toradol [ketorolac tromethamine] Anaphylaxis and Hives 06/08/2019   Tramadol Hives 01/11/2021   Levomenol  03/06/2020   Other Other (See Comments) 12/17/2015   Chamomile Nausea And Vomiting 03/06/2020   Hctz [hydrochlorothiazide] Other (See Comments) 06/22/2020   Lisinopril Nausea Only 01/18/2018   Losartan potassium Other (See Comments) 06/22/2020   Nystatin  04/04/2022   Penicillins Other (See Comments) 04/06/2012   Triamcinolone acetonide  01/02/2021      Review of Systems   General: Negative for anorexia, weight loss, fever, chills, fatigue, weakness. ENT: Negative for hoarseness, difficulty swallowing , nasal congestion. CV: Negative for chest pain, angina, palpitations, dyspnea on exertion, peripheral edema.  Respiratory: Negative for dyspnea at rest, dyspnea on exertion, cough, sputum, wheezing.  GI: See history of present illness. GU:  Negative for dysuria, hematuria, urinary incontinence, urinary frequency, nocturnal urination.  Endo: Negative for unusual weight change.     Physical Exam   BP 130/85   Pulse 85   Temp 98.6 F (37 C)   Ht 5\' 4"  (1.626 m)   Wt 275 lb 6.4 oz (124.9 kg)   LMP 10/09/2014   BMI 47.27 kg/m    General: Well-nourished, well-developed in no acute distress.  Eyes: No icterus. Mouth: Oropharyngeal mucosa moist and pink  Lungs: Clear to auscultation bilaterally.  Heart: Regular rate and rhythm, no murmurs rubs or gallops.  Abdomen: Bowel sounds are normal,   nondistended, no hepatosplenomegaly or masses,  no abdominal bruits , no rebound or guarding. Mild epig tenderness Rectal: not performed Extremities: No lower extremity edema. No clubbing or deformities. Neuro: Alert and oriented x 4   Skin: Warm and dry, no jaundice.   Psych: Alert and cooperative, normal mood and affect.  Labs   See hpi  Imaging  Studies   No results found.  Assessment   *GERD/epigastric pain *IBS *Fatty liver  *Hypoalbuminemia  Recent acute onset symptoms, unclear etiology for epigastric pain. Possible gastritis/PUD/refractory GERD. Discussed possible EGD but she would like to try different  PPI initially. She has never Dexilant and is interested. It will likely require P.A. therefore in interim will try her back on Pantoprazole 40mg  BID. If symptoms don't settle down, advise EGD.   IBS doing well.   History of hepatic steatosis on imaging. Recent albumin <2 in ED but normal in 11/2022. Consider follow up labs and evaluate for fibrosis at upcoming ov or after acute symptoms settle.   PLAN   Stop omeprazole. Start pantoprazole 40mg  twice daily before breakfast and evening meal. She is interested in Dexilant but will require P.A. which can take some time so will be trying pantoprazole BID initially. Continue sucralfate 3-4 times daily as needed, short term.  Continue dicyclomine prn diarrhea/abd cramping.  Offered her upper endoscopy now, plan to see if change in medication settles symptoms. If she does well, she will come back in 3-4 months for follow up. If not, then she will call for EGD.    Leanna Battles. Melvyn Neth, MHS, PA-C [facility]

## 2023-04-15 NOTE — Progress Notes (Signed)
GI Office Note    Referring Provider: Elfredia Nevins, MD Primary Care Physician:  Heather Nevins, MD  Primary Gastroenterologist: Heather Sessions, MD   Chief Complaint   Chief Complaint  Patient presents with   Follow-up    Pt thinks its time to switch her GERD meds.    History of Present Illness   Heather Fry is a 47 y.o. female presenting today for follow up. H/o mixed IBS, GERD, chronic RUQ pain, fatty liver. Last seen in 09/2022.   Seen in ED at [facility] for epigastric pain radiating into her chest. BP OK at home. She ate a couple of Altoid peppermints and took couple of doses of sucralfate but symptoms did not settle down. Decided to go to ED. May have created a panic attack. While in the ED, Sodium 138, potassium 3.9, BUN 9, creatinine 0.66, glucose 185, albumin 1.9, total bilirubin 0.2, alkaline phosphatase 78, AST 28, ALT 49, lipase 67, white blood cell count 11,200, hemoglobin 13.4, platelets 292,000, troponin 5, urine drug screen negative, urinalysis negative. Ended up signing out AMA, declined CT A/P as she was getting very sleepy and needed to be able to drive her and her daughter home.    She feels like is time to switch her PPI.  Typically ends up switching out pantoprazole or omeprazole every 6 months or so because they seem to lose efficacy.  Previously failed Nexium, Zegerid, Tagamet.  Has never tried Dexilant or Aciphex.  States her PCP prescribed Dexilant recently but cannot get it covered.  Requires a prior authorization.  Recently dealing with undermedicated thyroid issue.  At some point she was prescribed generic levothyroxine because of insurance coverage.  Typically she has required brand name due to absorption issues.  She is finally back on track with that but during this timeframe she had flare of her GI symptoms.  Also recently seen in the ED after a bee sting requiring use of her EpiPen.  Received IV steroids.  She was prescribed oral steroids but she did  not take because of her stomach issues.    Trying to lose weight because she needs knee replacement surgery.  Started Ozempic recently and after 1 shot she had significant abdominal pain so she does not want to try continuing.  IBS seems to be okay.  Recently did have a flare with diarrhea but resolved after use of Bentyl.  No melena, rectal bleeding.  No vomiting but she has had some nausea.  No dysphagia.  Sleeps in an adjustable bed, reclined position because of reflux.  Colonoscopy 07/2022: -diverticulosis -redundant colon -screening colonoscopy in 5 years due to positive FH of CRC.   Prior work up:   Colonoscopy in 2018 unremarkable.     EGD from November 2021 at [facility], gastritis, biopsies negative for H. pylori, fundic gland polyps.   CT pancreatic protocol completed August 2022 with no pancreatic abnormalities.  She had diffuse hepatic steatosis and a focal hyperdense nonenhancing nodularity along the posterior aspect of the right lobe of the liver measuring 11 mm that appears stable at least since 2019 and consistent with benign/indolent process.  MRI abdomen with and without contrast August 2022 revealed 1 cm subcapsular hemorrhagic cyst in the posterior right hepatic lobe, moderate to severe hepatic steatosis, incompletely visualized cystic lesion in the upper right pelvis measuring 8 cm with inability to exclude ovarian neoplasm.  MRI pelvis with and without contrast completed September 2022 with large cystic lesion associated  with the right ovary measuring up to 9.7 cm.  Differential including borderline tumor or large cyst with involution of adjacent smaller cyst.  GYN evaluation recommended.   Patient completed laparoscopic right oophorectomy November 2022.   In February 2023, sed rate 47, CRP 28.  Patient denies any autoimmune diagnosis.  In May 2022 sed rate 39, CRP 25.5.  RA screen negative, anti-CCP less than 5, complement C4 33, normal.  Complement C3  177 normal.  Sjogren's antibody less than 0.2 normal, ANA negative, adolase S 5.3, CK 68 normal. Wbc 8800, hemoglobin 12.9, platelets 281,000, glucose 155, BUN 10, creatinine 0.79, albumin 4.6, total bilirubin less than 0.2, alk phos 73, AST 22, ALT 28.   She has had elevated lipase in the 200 range intermittently dating back as far as 2019.  Highest lipase of 257 in July 2022.  LFTs have been normal aside from slight elevation of AST at 41 in June 2022. History of cholecystectomy.  States that she was given steroids once and it caused pancreatitis.  Medications   Current Outpatient Medications  Medication Sig Dispense Refill   acarbose (PRECOSE) 100 MG tablet Take 100 mg by mouth daily as needed (blood glucose above 160).     acetaminophen (TYLENOL) 500 MG tablet Take 1,000 mg by mouth daily as needed for moderate pain.     albuterol (VENTOLIN HFA) 108 (90 Base) MCG/ACT inhaler Inhale 2 puffs into the lungs every 6 (six) hours as needed. 18 g 11   Azelastine HCl 0.15 % SOLN Place 2 sprays into both nostrils 2 (two) times daily. 30 mL 5   beclomethasone (QVAR REDIHALER) 80 MCG/ACT inhaler Inhale 2 puffs into the lungs 2 (two) times daily. 1 each 0   cetirizine (ZYRTEC) 10 MG tablet Take 10 mg by mouth daily.     Cholecalciferol (VITAMIN D3) 125 MCG (5000 UT) CAPS Take 1 capsule (5,000 Units total) by mouth daily. (Patient taking differently: Take 10,000 Units by mouth daily.) 30 capsule 11   Continuous Blood Gluc Sensor (DEXCOM G7 SENSOR) MISC USE AS DIRECTED EVERY 10 DAYS     dicyclomine (BENTYL) 10 MG capsule Take 1 capsule (10 mg total) by mouth every 8 (eight) hours as needed for spasms. Take 1 capsule (10 mg total) by mouth up to 3 times daily before meals and at bedtime for abdominal cramping or diarrhea.  Hold in the setting of constipation. 60 capsule 11   diphenhydrAMINE (BENADRYL) 25 MG tablet Take 1 tablet (25 mg total) by mouth every 6 (six) hours. 20 tablet 0   EPINEPHrine 0.3 mg/0.3  mL IJ SOAJ injection Inject 0.3 mg into the muscle as needed for anaphylaxis. 2 each 1   EPINEPHrine 0.3 mg/0.3 mL IJ SOAJ injection Inject 0.3 mg into the muscle as needed for anaphylaxis. 1 each 1   EQUATE STOOL SOFTENER 100 MG capsule Take 100 mg by mouth 2 (two) times daily as needed for moderate constipation.     escitalopram (LEXAPRO) 20 MG tablet Take 1 tablet (20 mg total) by mouth at bedtime. 30 tablet 5   FIBER PO Take by mouth.     fluticasone (FLONASE) 50 MCG/ACT nasal spray Place 2 sprays into both nostrils at bedtime as needed for allergies.     glimepiride (AMARYL) 1 MG tablet Take 1 mg by mouth daily as needed (blood glucose above 200).     glucose blood (ACCU-CHEK AVIVA PLUS) test strip 1 each by Other route daily. And lancets 1/day 100 each 3  insulin glargine (LANTUS) 100 UNIT/ML injection Inject 20 Units into the skin daily.     ipratropium (ATROVENT) 0.02 % nebulizer solution INHALE 1 VIAL IN NEBULIZER EVERY 6 HOURS AS NEEDED 180 mL 0   Magnesium 250 MG TABS Take 250 mg by mouth at bedtime.      meclizine (ANTIVERT) 25 MG tablet Take 25 mg by mouth 4 (four) times daily as needed for dizziness.     NON FORMULARY at bedtime. CPAP at night     omeprazole (PRILOSEC) 40 MG capsule Take 1 capsule (40 mg total) by mouth 2 (two) times daily before a meal. 60 capsule 11   ondansetron (ZOFRAN ODT) 4 MG disintegrating tablet Take 1 tablet (4 mg total) by mouth every 8 (eight) hours as needed for nausea or vomiting. 40 tablet 11   rosuvastatin (CRESTOR) 10 MG tablet Take 10 mg by mouth every Saturday.     Simethicone 250 MG CAPS Take 500 mg by mouth at bedtime.     sucralfate (CARAFATE) 1 GM/10ML suspension TAKE 10 ML BY MOUTH  4 TIMES DAILY AS NEEDED 828 mL 0   SYNTHROID 137 MCG tablet Take 137 mcg by mouth daily before breakfast.     No current facility-administered medications for this visit.    Allergies   Allergies as of 04/15/2023 - Review Complete 04/15/2023  Allergen  Reaction Noted   Bee venom Shortness Of Breath, Swelling, and Palpitations 03/31/2023   Levofloxacin Other (See Comments) 03/03/2017   Toradol [ketorolac tromethamine] Anaphylaxis and Hives 06/08/2019   Tramadol Hives 01/11/2021   Levomenol  03/06/2020   Other Other (See Comments) 12/17/2015   Chamomile Nausea And Vomiting 03/06/2020   Hctz [hydrochlorothiazide] Other (See Comments) 06/22/2020   Lisinopril Nausea Only 01/18/2018   Losartan potassium Other (See Comments) 06/22/2020   Nystatin  04/04/2022   Penicillins Other (See Comments) 04/06/2012   Triamcinolone acetonide  01/02/2021      Review of Systems   General: Negative for anorexia, weight loss, fever, chills, fatigue, weakness. ENT: Negative for hoarseness, difficulty swallowing , nasal congestion. CV: Negative for chest pain, angina, palpitations, dyspnea on exertion, peripheral edema.  Respiratory: Negative for dyspnea at rest, dyspnea on exertion, cough, sputum, wheezing.  GI: See history of present illness. GU:  Negative for dysuria, hematuria, urinary incontinence, urinary frequency, nocturnal urination.  Endo: Negative for unusual weight change.     Physical Exam   BP 130/85   Pulse 85   Temp 98.6 F (37 C)   Ht 5\' 4"  (1.626 m)   Wt 275 lb 6.4 oz (124.9 kg)   LMP 10/09/2014   BMI 47.27 kg/m    General: Well-nourished, well-developed in no acute distress.  Eyes: No icterus. Mouth: Oropharyngeal mucosa moist and pink  Lungs: Clear to auscultation bilaterally.  Heart: Regular rate and rhythm, no murmurs rubs or gallops.  Abdomen: Bowel sounds are normal,   nondistended, no hepatosplenomegaly or masses,  no abdominal bruits , no rebound or guarding. Mild epig tenderness Rectal: not performed Extremities: No lower extremity edema. No clubbing or deformities. Neuro: Alert and oriented x 4   Skin: Warm and dry, no jaundice.   Psych: Alert and cooperative, normal mood and affect.  Labs   See hpi  Imaging  Studies   No results found.  Assessment   *GERD/epigastric pain *IBS *Fatty liver  *Hypoalbuminemia  Recent acute onset symptoms, unclear etiology for epigastric pain. Possible gastritis/PUD/refractory GERD. Discussed possible EGD but she would like to try different  PPI initially. She has never Dexilant and is interested. It will likely require P.A. therefore in interim will try her back on Pantoprazole 40mg  BID. If symptoms don't settle down, advise EGD.   IBS doing well.   History of hepatic steatosis on imaging. Recent albumin <2 in ED but normal in 11/2022. Consider follow up labs and evaluate for fibrosis at upcoming ov or after acute symptoms settle.   PLAN   Stop omeprazole. Start pantoprazole 40mg  twice daily before breakfast and evening meal. She is interested in Dexilant but will require P.A. which can take some time so will be trying pantoprazole BID initially. Continue sucralfate 3-4 times daily as needed, short term.  Continue dicyclomine prn diarrhea/abd cramping.  Offered her upper endoscopy now, plan to see if change in medication settles symptoms. If she does well, she will come back in 3-4 months for follow up. If not, then she will call for EGD.    Leanna Battles. Melvyn Neth, MHS, PA-C [facility]

## 2023-04-24 ENCOUNTER — Other Ambulatory Visit: Payer: Self-pay

## 2023-04-24 DIAGNOSIS — K760 Fatty (change of) liver, not elsewhere classified: Secondary | ICD-10-CM

## 2023-04-24 DIAGNOSIS — R7989 Other specified abnormal findings of blood chemistry: Secondary | ICD-10-CM

## 2023-04-24 MED ORDER — DEXLANSOPRAZOLE 60 MG PO CPDR: 60.0000 mg | DELAYED_RELEASE_CAPSULE | Freq: Every day | ORAL | 3 refills | Status: DC

## 2023-04-24 NOTE — Telephone Encounter (Signed)
Heather Fry sent in RX for Dexilant that will need P.A.  See my ov note for PPI failures. Add pantoprazole BID to the list.    Please also arrange for labs at [facility]: CMET CBC PT/INR NASH Fibrosure

## 2023-04-24 NOTE — Telephone Encounter (Signed)
Labs were ordered. Will submit PA on Monday.

## 2023-04-29 ENCOUNTER — Ambulatory Visit: Payer: Medicaid Other | Admitting: Allergy & Immunology

## 2023-04-29 VITALS — BP 130/70 | HR 93 | Temp 101.0°F | Resp 16 | Ht 63.19 in | Wt 272.4 lb

## 2023-04-29 DIAGNOSIS — J4540 Moderate persistent asthma, uncomplicated: Secondary | ICD-10-CM

## 2023-04-29 DIAGNOSIS — T7800XA Anaphylactic reaction due to unspecified food, initial encounter: Secondary | ICD-10-CM | POA: Diagnosis not present

## 2023-04-29 DIAGNOSIS — J3089 Other allergic rhinitis: Secondary | ICD-10-CM | POA: Diagnosis not present

## 2023-04-29 DIAGNOSIS — B999 Unspecified infectious disease: Secondary | ICD-10-CM

## 2023-04-29 DIAGNOSIS — H938X3 Other specified disorders of ear, bilateral: Secondary | ICD-10-CM

## 2023-04-29 DIAGNOSIS — J302 Other seasonal allergic rhinitis: Secondary | ICD-10-CM

## 2023-04-29 NOTE — Progress Notes (Signed)
FOLLOW UP  Date of Service/Encounter:  04/29/23   Assessment:   Moderate persistent asthma, uncomplicated    Intolerant of inhaled corticosteroids due to recurrent thrush   Seasonal and perennial allergic rhinitis (indoor molds, outdoor molds, and dust mites)   Multiple drug allergies versus intolerances   Plan/Recommendations:   1. Moderate persistent asthma, uncomplicated - Lung testing not done today.  - We are going to keep working on the Qvar approval.  - Daily controller medication(s): Qvar Redihaler 2 puffs twice daily - We will work through the PA process for this (this one should NOT cause thrush).  - Rescue medications: Atrovent (ipratropium) one treatment daily as needed OR Xopenex nebulizer treatment every 6 hours as needed. - Asthma control goals:  * Full participation in all desired activities (may need albuterol before activity) * Albuterol use two time or less a week on average (not counting use with activity) * Cough interfering with sleep two time or less a month * Oral steroids no more than once a year * No hospitalizations  2. Perennial and seasonal allergic rhinitis - Previous testing showed: indoor molds, outdoor molds, and dust mites. - We are obviously not doing any allergy shots!  - Continue with: Flonase and Zyrtec - Continue with: Astelin (azelastine) 2 sprays per nostril 1-2 times daily as needed (at least use during these emerging sinus issues)  - You can use an extra dose of the antihistamine, if needed, for breakthrough symptoms.  - Consider nasal saline rinses 1-2 times daily to remove allergens from the nasal cavities as well as help with mucous clearance (this is especially helpful to do before the nasal sprays are given) - We are going to refer you to see ENT for your ear fullness.   3. Recurrent infections - Work up was normal thankfully. - I do not think that we need to do anything further at this point in time.   4. Stinging  insect allergy - We are going to get labs to look for stinging insect allergies. - We are also looking for elevated tryptase and mast cell activity.   5. Return in about 6 months (around 10/30/2023).    Subjective:   Heather Fry is a 47 y.o. female presenting today for follow up of  Chief Complaint  Patient presents with   Asthma    Says she went to [facility] and started a few flare ups.     Heather Fry has a history of the following: Patient Active Problem List   Diagnosis Date Noted   Moderate persistent asthma, uncomplicated 06/08/2022   Seasonal and perennial allergic rhinitis 06/08/2022   Multiple drug allergies 06/08/2022   Generalized abdominal pain 05/09/2022   Allergic reaction caused by a drug 04/04/2022   Oral thrush 04/04/2022   Type 2 diabetes mellitus with hyperglycemia (HCC) 04/04/2022   Fatty liver 02/21/2022   FH: colon cancer in relative diagnosed at >56 years old 02/21/2022   Daytime somnolence 01/11/2022   Morbid obesity (HCC) 01/11/2022   Psychophysiologic insomnia 01/11/2022   Dyslipidemia 10/15/2021   DOE (dyspnea on exertion) 10/15/2021   Essential hypertension 10/15/2021   Precordial chest pain 10/15/2021   Hypocalcemia 08/08/2021   Diabetes (HCC) 05/30/2021   Disc disease, degenerative, cervical 04/19/2021   Right carpal tunnel syndrome 04/19/2021   Irritable bowel syndrome 04/17/2021   Weight gain 12/20/2020   Asthma without status asthmaticus 07/29/2020   History of Graves' disease 07/29/2020   Postoperative hypothyroidism 07/29/2020  Diarrhea 05/08/2020   Idiopathic peripheral neuropathy 04/18/2020   Myelopathy (HCC) 04/18/2020   OSA (obstructive sleep apnea) 04/18/2020   RLS (restless legs syndrome) 04/18/2020   Sensory disturbance 01/20/2020   Atypical chest pain 01/20/2020   Obesity, Class III, BMI 40-49.9 (morbid obesity) (HCC) 01/20/2020   Weakness 01/19/2020   Hyperglycemia 10/04/2019   Myalgia 09/05/2019   Vitamin B 12  deficiency 07/28/2019   Right sided numbness 06/29/2019   Constipation 06/15/2019   Elevated lipase 06/15/2019   Hypomagnesemia 03/29/2019   Leukocytosis 03/29/2019   Pain of upper abdomen 02/10/2019   Nausea without vomiting 02/10/2019   Tingling of face 11/30/2018   Hot flashes 11/30/2018   Vitamin D deficiency 10/06/2018   Gastroesophageal reflux disease 01/28/2017   Rectal bleeding 01/28/2017   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  Heather Fry is a 47 y.o. female presenting for a follow up visit.  She was last seen in February 2024.  At that time, her lung testing was much better.  We continue with Qvar 80 mcg 2 puffs twice daily.  For her rescue medication, she uses Atrovent as needed or Xopenex nebulizer treatment every 6 hours.  She has a history of allergic rhinitis with previous testing is positive to molds and dust mites.  We continue with Flonase and Zyrtec with Astelin added if needed.  For her recurrent infections, we obtained some labs to look at her immune system.  Labs look completely normal.  She was protective to 16 out of 23 strains of Streptococcus pneumonia.   Since last visit, she has not done well. Her father died this past weekend.   Asthma/Respiratory Symptom History: She is not on the Qvar. We sent in Stiolto instead but she did not take it long term due to her heart issues. However, she did not feel that it helped much at all. We had changed to Qvar because of her history of thrush. We never were abel to get her Qvar approved, but we will continue to try this. She did stay with her uncle. She had a bad asthma attack that night and struggled all weekend with her lungs. She was better when she got here and she has been settling back down.   Allergic Rhinitis  Symptom History: She remains on the Flonase and the cetirizine. She uses the Atrovent as needed. She carries the albuterol and EpiPen with her everywhere.  She has not been on antibiotics since the last time we saw her. Her infectious history is largely reassuring at this time. She has Astelin as well, but does not use this every day.   A few weeks ago, she had a sting on her right arm. She developed some breathing problems around one hour after the bite. She started feeling dizzy and her throat was tight. She did get her EpiPen and she had pulse ox. She had some hives on her chest and arms but this was it. She had a bruising around the sting site. She had been stung in the past and she was fine. But this was before her thyroid was removed.   Otherwise, there have been no changes to her past medical history, surgical history, family history, or social history.    Review of systems otherwise negative other than that mentioned  in the HPI.    Objective:   Blood pressure 130/70, pulse 93, temperature (!) 101 F (38.3 C), temperature source Temporal, resp. rate 16, height 5' 3.19" (1.605 m), weight 272 lb 6.4 oz (123.6 kg), last menstrual period 10/09/2014, SpO2 97%. Body mass index is 47.97 kg/m.    Physical Exam Vitals reviewed.  Constitutional:      General: She is not in acute distress.    Appearance: She is well-developed. She is obese.     Comments: Friendly and very talkative.  HENT:     Head: Normocephalic and atraumatic.     Right Ear: Tympanic membrane, ear canal and external ear normal. No drainage, swelling or tenderness. Tympanic membrane is not injected, scarred, erythematous, retracted or bulging.     Left Ear: Tympanic membrane, ear canal and external ear normal. No drainage, swelling or tenderness. Tympanic membrane is not injected, scarred, erythematous, retracted or bulging.     Nose: Mucosal edema and rhinorrhea present. No nasal deformity or septal deviation.     Right  Turbinates: Enlarged, swollen and pale.     Left Turbinates: Enlarged, swollen and pale.     Right Sinus: No maxillary sinus tenderness or frontal sinus tenderness.     Left Sinus: No maxillary sinus tenderness or frontal sinus tenderness.     Comments: No polyps.     Mouth/Throat:     Mouth: Mucous membranes are not pale and not dry.     Pharynx: Uvula midline.  Eyes:     General:        Right eye: No discharge.        Left eye: No discharge.     Conjunctiva/sclera: Conjunctivae normal.     Right eye: Right conjunctiva is not injected. No chemosis.    Left eye: Left conjunctiva is not injected. No chemosis.    Pupils: Pupils are equal, round, and reactive to light.  Cardiovascular:     Rate and Rhythm: Normal rate and regular rhythm.     Heart sounds: Normal heart sounds.  Pulmonary:     Effort: Pulmonary effort is normal. No tachypnea, accessory muscle usage or respiratory distress.     Breath sounds: Normal breath sounds. No wheezing, rhonchi or rales.     Comments: Moving air well in all lung fields. No increased work of breathing noted.  Chest:     Chest wall: No tenderness.  Lymphadenopathy:     Head:     Right side of head: No submandibular, tonsillar or occipital adenopathy.     Left side of head: No submandibular, tonsillar or occipital adenopathy.     Cervical: No cervical adenopathy.  Skin:    Coloration: Skin is not pale.     Findings: No abrasion, erythema, petechiae or rash. Rash is not papular, urticarial or vesicular.  Neurological:     Mental Status: She is alert.  Psychiatric:        Behavior: Behavior is cooperative.      Diagnostic studies: labs sent instead      Malachi Bonds, MD  [facility]

## 2023-04-29 NOTE — Patient Instructions (Addendum)
1. Moderate persistent asthma, uncomplicated - Lung testing not done today.  - We are going to keep working on the Qvar approval.  - Daily controller medication(s): Qvar Redihaler 2 puffs twice daily - We will work through the PA process for this (this one should NOT cause thrush).  - Rescue medications: Atrovent (ipratropium) one treatment daily as needed OR Xopenex nebulizer treatment every 6 hours as needed. - Asthma control goals:  * Full participation in all desired activities (may need albuterol before activity) * Albuterol use two time or less a week on average (not counting use with activity) * Cough interfering with sleep two time or less a month * Oral steroids no more than once a year * No hospitalizations  2. Perennial and seasonal allergic rhinitis - Previous testing showed: indoor molds, outdoor molds, and dust mites. - We are obviously not doing any allergy shots!  - Continue with: Flonase and Zyrtec - Continue with: Astelin (azelastine) 2 sprays per nostril 1-2 times daily as needed (at least use during these emerging sinus issues)  - You can use an extra dose of the antihistamine, if needed, for breakthrough symptoms.  - Consider nasal saline rinses 1-2 times daily to remove allergens from the nasal cavities as well as help with mucous clearance (this is especially helpful to do before the nasal sprays are given) - We are going to refer you to see ENT for your ear fullness.   3. Recurrent infections - Work up was normal thankfully. - I do not think that we need to do anything further at this point in time.   4. Stinging insect allergy - We are going to get labs to look for stinging insect allergies. - We are also looking for elevated tryptase and mast cell activity.   5. Return in about 6 months (around 10/30/2023).    Please inform us of any Emergency Department visits, hospitalizations, or changes in symptoms. Call us before going to the ED for breathing or  allergy symptoms since we might be able to fit you in for a sick visit. Feel free to contact us anytime with any questions, problems, or concerns.  It was a pleasure to see you and meet your oldest daughter today!  Websites that have reliable patient information: 1. American Academy of Asthma, Allergy, and Immunology: www.aaaai.org 2. Food Allergy Research and Education (FARE): foodallergy.org 3. Mothers of Asthmatics: http://www.asthmacommunitynetwork.org 4. American College of Allergy, Asthma, and Immunology: www.acaai.org   COVID-19 Vaccine Information can be found at: [email or web address] For questions related to vaccine distribution or appointments, please email [email or web address] or call [phone].   We realize that you might be concerned about having an allergic reaction to the COVID19 vaccines. To help with that concern, WE ARE OFFERING THE COVID19 VACCINES IN OUR OFFICE! Ask the front desk for dates!     "Like" [place] on Facebook and Instagram for our latest updates!      A healthy democracy works best when [employer] participate! Make sure you are registered to vote! If you have moved or changed any of your contact information, you will need to get this updated before voting!  In some cases, you MAY be able to register to vote online: [email or web address]

## 2023-04-30 ENCOUNTER — Emergency Department (HOSPITAL_COMMUNITY)
Admission: EM | Admit: 2023-04-30 | Discharge: 2023-04-30 | Disposition: A | Discharge status: Home / Self Care | Payer: Medicaid Other | Attending: Emergency Medicine | Admitting: Emergency Medicine

## 2023-04-30 ENCOUNTER — Other Ambulatory Visit: Payer: Self-pay

## 2023-04-30 ENCOUNTER — Emergency Department (HOSPITAL_COMMUNITY): Payer: Medicaid Other

## 2023-04-30 ENCOUNTER — Encounter (HOSPITAL_COMMUNITY): Payer: Self-pay | Admitting: Emergency Medicine

## 2023-04-30 DIAGNOSIS — E119 Type 2 diabetes mellitus without complications: Secondary | ICD-10-CM | POA: Insufficient documentation

## 2023-04-30 DIAGNOSIS — R1010 Upper abdominal pain, unspecified: Secondary | ICD-10-CM | POA: Diagnosis present

## 2023-04-30 DIAGNOSIS — Z7984 Long term (current) use of oral hypoglycemic drugs: Secondary | ICD-10-CM | POA: Insufficient documentation

## 2023-04-30 DIAGNOSIS — K219 Gastro-esophageal reflux disease without esophagitis: Secondary | ICD-10-CM | POA: Insufficient documentation

## 2023-04-30 DIAGNOSIS — Z20822 Contact with and (suspected) exposure to covid-19: Secondary | ICD-10-CM | POA: Insufficient documentation

## 2023-04-30 DIAGNOSIS — M7989 Other specified soft tissue disorders: Secondary | ICD-10-CM | POA: Insufficient documentation

## 2023-04-30 LAB — COMPREHENSIVE METABOLIC PANEL
ALT: 35 U/L (ref 0–44)
AST: 28 U/L (ref 15–41)
Albumin: 3.9 g/dL (ref 3.5–5.0)
Alkaline Phosphatase: 55 U/L (ref 38–126)
Anion gap: 10 (ref 5–15)
BUN: 9 mg/dL (ref 6–20)
CO2: 25 mmol/L (ref 22–32)
Calcium: 9.6 mg/dL (ref 8.9–10.3)
Chloride: 103 mmol/L (ref 98–111)
Creatinine, Ser: 0.63 mg/dL (ref 0.44–1.00)
GFR, Estimated: >60 mL/min (ref >60)
Glucose, Bld: 160 mg/dL — ABNORMAL HIGH (ref 70–99)
Potassium: 3.9 mmol/L (ref 3.5–5.1)
Sodium: 138 mmol/L (ref 135–145)
Total Bilirubin: 0.5 mg/dL (ref 0.3–1.2)
Total Protein: 7.4 g/dL (ref 6.5–8.1)

## 2023-04-30 LAB — URINALYSIS, ROUTINE W REFLEX MICROSCOPIC
Bilirubin Urine: NEGATIVE
Glucose, UA: NEGATIVE mg/dL
Hgb urine dipstick: NEGATIVE
Ketones, ur: NEGATIVE mg/dL
Leukocytes,Ua: NEGATIVE
Nitrite: NEGATIVE
Protein, ur: NEGATIVE mg/dL
Specific Gravity, Urine: 1.011 (ref 1.005–1.030)
pH: 8 (ref 5.0–8.0)

## 2023-04-30 LAB — CBC
HCT: 39.3 % (ref 36.0–46.0)
Hemoglobin: 13 g/dL (ref 12.0–15.0)
MCH: 28.3 pg (ref 26.0–34.0)
MCHC: 33.1 g/dL (ref 30.0–36.0)
MCV: 85.6 fL (ref 80.0–100.0)
Platelets: 257 10*3/uL (ref 150–400)
RBC: 4.59 MIL/uL (ref 3.87–5.11)
RDW: 14.9 % (ref 11.5–15.5)
WBC: 8.6 10*3/uL (ref 4.0–10.5)
nRBC: 0 % (ref 0.0–0.2)

## 2023-04-30 LAB — LIPASE, BLOOD: Lipase: 45 U/L (ref 11–51)

## 2023-04-30 LAB — MAGNESIUM: Magnesium: 1.9 mg/dL (ref 1.7–2.4)

## 2023-04-30 LAB — SARS CORONAVIRUS 2 BY RT PCR: SARS Coronavirus 2 by RT PCR: NEGATIVE

## 2023-04-30 NOTE — Discharge Instructions (Signed)
As discussed your lab tests and exam today are reassuring.  I recommend going back to your pantoprazole since this did a better job with your acid reflux symptoms.  You may also add the sucralfate you have at home as needed for break through symptoms.  Your lab tests are reassuring.  Also, your ultrasound is negative for any sign of a blood clot in your leg.

## 2023-04-30 NOTE — ED Triage Notes (Signed)
Pt reports abd pain, intermittent diarrhea, headache, weakness, chills x 3-4 days

## 2023-04-30 NOTE — ED Provider Notes (Signed)
[place] AT [facility] Provider Note   CSN: [id number] Arrival date & time: 04/30/23  1344     History  Chief Complaint  Patient presents with   Abdominal Pain    Heather Fry is a 47 y.o. female presenting for evaluation of suspected GERD flare.  She describes upper abdominal pain with pressure sensation and increased acid reflux since switching from pantoprazole to Dexilant this week.  Additionally she has had several episodes of nonbloody diarrhea which is typical when her reflux is not under good control.  She reports generalized weakness and has had chills for the past 3 to 4 days, no documented fevers however.  There is no radiation of pain into her back.  She endorses that she plans to switch back to pantoprazole but with other significant medical history including diabetes, IBS, Graves' disease and history of diverticulosis she wanted to make sure there was not anything else causing her symptoms.   She does note being prescribed a Z-Pak a month ago secondary to suspected infected bee sting bite, however only took 1 tablet and did not complete the course of the antibiotics.  She denies chest pain, shortness of breath, dysuria.  Past medical history is also significant for surgical history of abdominal hysterectomy, cholecystectomy.  She also has complaint of left posterior knee fullness sensation since road trip to [place] about 10 days ago.  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  Denies history of blood clots.  The history is provided by the patient.       Home Medications Prior to Admission medications   Medication Sig Start Date End Date Taking? Authorizing Provider  acarbose (PRECOSE) 100 MG tablet Take 100 mg by mouth daily as needed (blood glucose above 160). 11/01/21   [provider]  albuterol (VENTOLIN HFA) 108 (90 Base) MCG/ACT inhaler Inhale 2 puffs into the lungs every 6 (six)  hours as needed. 12/13/19   Steffanie Dunn, DO  Azelastine HCl 0.15 % SOLN Place 2 sprays into both nostrils 2 (two) times daily. 11/07/22   Alfonse Spruce, MD  cetirizine (ZYRTEC) 10 MG tablet Take 10 mg by mouth daily.    [provider]  Cholecalciferol (VITAMIN D3) 125 MCG (5000 UT) CAPS Take 1 capsule (5,000 Units total) by mouth daily. Patient taking differently: Take 10,000 Units by mouth daily. 10/13/19   Romero Belling, MD  Continuous Blood Gluc Sensor (DEXCOM G7 SENSOR) MISC USE AS DIRECTED EVERY 10 DAYS 09/26/22   [provider]  dexlansoprazole (DEXILANT) 60 MG capsule Take 1 capsule (60 mg total) by mouth daily before breakfast. 04/24/23   Tiffany Kocher, PA-C  EPINEPHrine 0.3 mg/0.3 mL IJ SOAJ injection Inject 0.3 mg into the muscle as needed for anaphylaxis. 07/02/22   Alfonse Spruce, MD  EQUATE STOOL SOFTENER 100 MG capsule Take 100 mg by mouth 2 (two) times daily as needed for moderate constipation. 08/21/21   [provider]  escitalopram (LEXAPRO) 20 MG tablet Take 1 tablet (20 mg total) by mouth at bedtime. 01/26/19   Romero Belling, MD  FIBER PO Take by mouth.    [provider]  fluticasone (FLONASE) 50 MCG/ACT nasal spray Place 2 sprays into both nostrils at bedtime as needed for allergies.    [provider]  glimepiride (AMARYL) 1 MG tablet Take 1 mg by mouth daily as needed (blood glucose above 200). 01/24/22  [provider]  glucose blood (ACCU-CHEK AVIVA PLUS) test strip 1 each by Other route daily. And lancets 1/day 05/30/21   Romero Belling, MD  insulin glargine (LANTUS) 100 UNIT/ML injection Inject 20 Units into the skin daily.    [provider]  ipratropium (ATROVENT) 0.02 % nebulizer solution INHALE 1 VIAL IN NEBULIZER EVERY 6 HOURS AS NEEDED 03/03/23   Alfonse Spruce, MD  Magnesium 250 MG TABS Take 250 mg by mouth at bedtime.     [provider]  meclizine (ANTIVERT) 25 MG tablet Take  25 mg by mouth 4 (four) times daily as needed for dizziness. 10/29/21   [provider]  NON FORMULARY at bedtime. CPAP at night    [provider]  rosuvastatin (CRESTOR) 10 MG tablet Take 10 mg by mouth every Saturday. 12/05/21   [provider]  Simethicone 250 MG CAPS Take 500 mg by mouth at bedtime.    [provider]  sucralfate (CARAFATE) 1 GM/10ML suspension TAKE 10 ML BY MOUTH  4 TIMES DAILY AS NEEDED 06/09/22   Letta Median, PA-C  SYNTHROID 137 MCG tablet Take 137 mcg by mouth daily before breakfast. 03/14/22   [provider]      Allergies    Bee venom, Ketorolac tromethamine, Hydrochlorothiazide, Levofloxacin, Levomenol, Other, Tramadol, Triamcinolone acetonide, Budesonide-formoterol fumarate, Lisinopril, Losartan potassium, Nystatin, Penicillins, and Wound dressing adhesive    Review of Systems   Review of Systems  Constitutional:  Positive for fever.  HENT:  Negative for congestion and sore throat.   Eyes: Negative.   Respiratory:  Negative for chest tightness and shortness of breath.   Cardiovascular:  Negative for chest pain.  Gastrointestinal:  Positive for abdominal pain, diarrhea and nausea. Negative for vomiting.  Genitourinary: Negative.   Musculoskeletal:  Negative for arthralgias, joint swelling and neck pain.  Skin: Negative.  Negative for rash and wound.  Neurological:  Negative for dizziness, weakness, light-headedness, numbness and headaches.  Psychiatric/Behavioral: Negative.      Physical Exam Updated Vital Signs BP 126/83   Pulse 84   Temp 98.9 F (37.2 C) (Oral)   Resp (!) 21   Ht 5\' 3"  (1.6 m)   Wt 123.4 kg   LMP 10/09/2014   SpO2 95%   BMI 48.18 kg/m  Physical Exam Vitals and nursing note reviewed.  Constitutional:      Appearance: She is well-developed.  HENT:     Head: Normocephalic and atraumatic.  Eyes:     Conjunctiva/sclera: Conjunctivae normal.  Cardiovascular:     Rate and Rhythm:  Normal rate and regular rhythm.     Heart sounds: Normal heart sounds.  Pulmonary:     Effort: Pulmonary effort is normal.     Breath sounds: Normal breath sounds. No wheezing.  Abdominal:     General: Abdomen is protuberant. Bowel sounds are normal.     Palpations: Abdomen is soft.     Tenderness: There is abdominal tenderness in the epigastric area. There is no guarding or rebound.  Musculoskeletal:        General: Normal range of motion.     Cervical back: Normal range of motion.  Skin:    General: Skin is warm and dry.  Neurological:     Mental Status: She is alert.     ED Results / Procedures / Treatments   Labs (all labs ordered are listed, but only abnormal results are displayed) Labs Reviewed  COMPREHENSIVE METABOLIC PANEL - Abnormal; Notable for  the following components:      Result Value   Glucose, Bld 160 (*)    All other components within normal limits  SARS CORONAVIRUS 2 BY RT PCR  LIPASE, BLOOD  CBC  URINALYSIS, ROUTINE W REFLEX MICROSCOPIC  MAGNESIUM    EKG EKG Interpretation Date/Time:  Thursday April 30 2023 17:18:51 EDT Ventricular Rate:  83 PR Interval:  158 QRS Duration:  75 QT Interval:  396 QTC Calculation: 466 R Axis:   51  Text Interpretation: Sinus rhythm Low voltage, precordial leads Confirmed by Vonita Moss [phone]) on 05/01/2023 12:27:29 AM  Radiology US Venous Img Lower  Left (DVT Study)  Result Date: 04/30/2023 CLINICAL DATA:  Intermittent left calf pain for the past 3 days. Evaluate for DVT. EXAM: LEFT LOWER EXTREMITY VENOUS DOPPLER ULTRASOUND TECHNIQUE: Gray-scale sonography with graded compression, as well as color Doppler and duplex ultrasound were performed to evaluate the lower extremity deep venous systems from the level of the common femoral vein and including the common femoral, femoral, profunda femoral, popliteal and calf veins including the posterior tibial, peroneal and gastrocnemius veins when visible. The superficial  great saphenous vein was also interrogated. Spectral Doppler was utilized to evaluate flow at rest and with distal augmentation maneuvers in the common femoral, femoral and popliteal veins. COMPARISON:  None Available. FINDINGS: Contralateral Common Femoral Vein: Respiratory phasicity is normal and symmetric with the symptomatic side. No evidence of thrombus. Normal compressibility. Common Femoral Vein: No evidence of thrombus. Normal compressibility, respiratory phasicity and response to augmentation. Saphenofemoral Junction: No evidence of thrombus. Normal compressibility and flow on color Doppler imaging. Profunda Femoral Vein: No evidence of thrombus. Normal compressibility and flow on color Doppler imaging. Femoral Vein: No evidence of thrombus. Normal compressibility, respiratory phasicity and response to augmentation. Popliteal Vein: No evidence of thrombus. Normal compressibility, respiratory phasicity and response to augmentation. Calf Veins: No evidence of thrombus. Normal compressibility and flow on color Doppler imaging. Superficial Great Saphenous Vein: No evidence of thrombus. Normal compressibility. Other Findings:  None. IMPRESSION: No evidence of DVT within the left lower extremity. Electronically Signed   By: Simonne Come M.D.   On: 04/30/2023 14:54    Procedures Procedures    Medications Ordered in ED Medications - No data to display  ED Course/ Medical Decision Making/ A&P                                 Medical Decision Making Patient presenting with epigastric discomfort, nausea without emesis along with mild diarrhea since switching from Protonix to Dexilant.  She suspects this is acid exacerbation as her symptoms are similar to prior similar symptoms, her history and today's exam and evaluation suggest this as well.  She is recommended to switch back to the Protonix.  She also states she has Carafate at home that she can use for as needed use as well.  DVT study is negative.  I  do not believe that 1 Zithromax tablet a month ago would increase patient's risk for C. difficile, she was unable to provide a stool sample here, her pattern of diarrhea does not suggest bacterial infection, C. difficile test was DC'd.  Advise close follow-up with her GI specialist if symptoms persist or worsen.  Amount and/or Complexity of Data Reviewed Labs: ordered.    Details: Labs are reassuring, UA is clear, c-Met is normal except for glucose of 160, lipase normal, CBC is normal with a  WBC count of 8.6 Radiology: ordered.    Details: Ultrasound negative for DVT ECG/medicine tests: ordered and independent interpretation performed.    Details: Normal sinus rhythm, rate 83  Risk Prescription drug management.           Final Clinical Impression(s) / ED Diagnoses Final diagnoses:  Gastroesophageal reflux disease, unspecified whether esophagitis present    Rx / DC Orders ED Discharge Orders     None         Victoriano Lain 05/01/23 1102    Rondel Baton, MD 05/02/23 [id number]

## 2023-04-30 NOTE — ED Notes (Signed)
Pt stated that GI doc changed her PPI Monday to dexilant Since med change with has been burping, feeling weak and has chills that are coming and going.  Vitals listed Pt able to walk to and from restroom without assistance or difficulty for urine sample.

## 2023-04-30 NOTE — ED Provider Triage Note (Signed)
Emergency Medicine Provider Triage Evaluation Note  Heather Fry , a 47 y.o. female  was evaluated in triage.  Pt complains of epigastric abdominal pain, diarrhea and left calf pain.  Patient dates she recently drove to [place] when she began noticing left calf pain but denies any swelling or previous blood clots.  Patient also notes that she had a Z-Pak a few weeks ago for a bee sting and over the past few days has been having loose stools that been nonbloody.  Patient says she feels overall weak and chills.  Patient states she feels nauseous without emesis.  Patient denies chest pain, shortness of breath, dysuria, hematuria.  Review of Systems  Positive: See HPI Negative: See HPI  Physical Exam  BP (!) 147/92 (BP Location: Left Arm)   Pulse 90   Temp 99 F (37.2 C) (Oral)   Resp 16   Ht 5\' 3"  (1.6 m)   Wt 123.4 kg   LMP 10/09/2014   SpO2 99%   BMI 48.18 kg/m  Gen:   Awake, no distress   Resp:  Normal effort  MSK:   Moves extremities without difficulty  Other:  Abdomen nontender to palpation, no peritoneal signs noted, no calf tenderness to palpation, no lower leg edema noted  Medical Decision Making  Medically screening exam initiated at 2:21 PM.  Appropriate orders placed.  Heather Fry was informed that the remainder of the evaluation will be completed by another provider, this initial triage assessment does not replace that evaluation, and the importance of remaining in the ED until their evaluation is complete.  Workup initiated, patient stable at this time   Heather Fry 04/30/23 1424

## 2023-05-01 NOTE — Telephone Encounter (Signed)
Please schedule for EGD with [facility]. ASA 3. Patient wanting asap so if not available, maybe Marletta Lor if she is willing.  Dx: epig pain, refractory GERD.

## 2023-05-02 ENCOUNTER — Encounter: Payer: Self-pay | Admitting: Allergy & Immunology

## 2023-05-04 ENCOUNTER — Encounter (HOSPITAL_COMMUNITY)
Admission: RE | Admit: 2023-05-04 | Discharge: 2023-05-04 | Disposition: A | Discharge status: Home / Self Care | Payer: Medicaid Other | Source: Ambulatory Visit | Attending: Internal Medicine | Admitting: Internal Medicine

## 2023-05-04 NOTE — Telephone Encounter (Signed)
[employer] PA: APPROVED Authorization #: [id number] DOS: 05/06/23-05/30/23

## 2023-05-06 ENCOUNTER — Encounter (HOSPITAL_COMMUNITY): Payer: Self-pay | Admitting: Internal Medicine

## 2023-05-06 ENCOUNTER — Ambulatory Visit (HOSPITAL_COMMUNITY): Payer: Self-pay | Admitting: Anesthesiology

## 2023-05-06 ENCOUNTER — Ambulatory Visit (HOSPITAL_BASED_OUTPATIENT_CLINIC_OR_DEPARTMENT_OTHER): Payer: Medicaid Other | Admitting: Anesthesiology

## 2023-05-06 ENCOUNTER — Encounter (HOSPITAL_COMMUNITY)
Admission: RE | Disposition: A | Discharge status: Home / Self Care | Payer: Self-pay | Source: Home / Self Care | Attending: Internal Medicine

## 2023-05-06 ENCOUNTER — Ambulatory Visit (HOSPITAL_COMMUNITY)
Admission: RE | Admit: 2023-05-06 | Discharge: 2023-05-06 | Disposition: A | Discharge status: Home / Self Care | Payer: Medicaid Other | Attending: Internal Medicine | Admitting: Internal Medicine

## 2023-05-06 DIAGNOSIS — K317 Polyp of stomach and duodenum: Secondary | ICD-10-CM | POA: Diagnosis not present

## 2023-05-06 DIAGNOSIS — R1010 Upper abdominal pain, unspecified: Secondary | ICD-10-CM

## 2023-05-06 DIAGNOSIS — K3189 Other diseases of stomach and duodenum: Secondary | ICD-10-CM | POA: Diagnosis not present

## 2023-05-06 DIAGNOSIS — I10 Essential (primary) hypertension: Secondary | ICD-10-CM | POA: Insufficient documentation

## 2023-05-06 DIAGNOSIS — K219 Gastro-esophageal reflux disease without esophagitis: Secondary | ICD-10-CM | POA: Diagnosis not present

## 2023-05-06 DIAGNOSIS — Z794 Long term (current) use of insulin: Secondary | ICD-10-CM | POA: Insufficient documentation

## 2023-05-06 DIAGNOSIS — J45909 Unspecified asthma, uncomplicated: Secondary | ICD-10-CM | POA: Diagnosis not present

## 2023-05-06 DIAGNOSIS — Z87891 Personal history of nicotine dependence: Secondary | ICD-10-CM | POA: Diagnosis not present

## 2023-05-06 DIAGNOSIS — Z7984 Long term (current) use of oral hypoglycemic drugs: Secondary | ICD-10-CM | POA: Insufficient documentation

## 2023-05-06 DIAGNOSIS — K2970 Gastritis, unspecified, without bleeding: Secondary | ICD-10-CM | POA: Diagnosis not present

## 2023-05-06 DIAGNOSIS — K582 Mixed irritable bowel syndrome: Secondary | ICD-10-CM | POA: Diagnosis not present

## 2023-05-06 DIAGNOSIS — K760 Fatty (change of) liver, not elsewhere classified: Secondary | ICD-10-CM | POA: Diagnosis not present

## 2023-05-06 DIAGNOSIS — G4730 Sleep apnea, unspecified: Secondary | ICD-10-CM | POA: Insufficient documentation

## 2023-05-06 DIAGNOSIS — E119 Type 2 diabetes mellitus without complications: Secondary | ICD-10-CM | POA: Diagnosis not present

## 2023-05-06 HISTORY — PX: ESOPHAGOGASTRODUODENOSCOPY (EGD) WITH PROPOFOL: SHX5813

## 2023-05-06 HISTORY — PX: BIOPSY: SHX5522

## 2023-05-06 LAB — GLUCOSE, CAPILLARY: Glucose-Capillary: 163 mg/dL — ABNORMAL HIGH (ref 70–99)

## 2023-05-06 SURGERY — ESOPHAGOGASTRODUODENOSCOPY (EGD) WITH PROPOFOL
Anesthesia: General

## 2023-05-06 MED ORDER — PROPOFOL 500 MG/50ML IV EMUL
INTRAVENOUS | Status: DC | PRN
  Administered 2023-05-06: 150 ug/kg/min via INTRAVENOUS

## 2023-05-06 MED ORDER — LIDOCAINE HCL (CARDIAC) PF 100 MG/5ML IV SOSY
PREFILLED_SYRINGE | INTRAVENOUS | Status: DC | PRN
  Administered 2023-05-06: 50 mg via INTRAVENOUS

## 2023-05-06 MED ORDER — PROPOFOL 10 MG/ML IV BOLUS
INTRAVENOUS | Status: DC | PRN
Start: 2023-05-06 — End: 2023-05-06
  Administered 2023-05-06: 40 mg via INTRAVENOUS
  Administered 2023-05-06: 100 mg via INTRAVENOUS
  Administered 2023-05-06: 60 mg via INTRAVENOUS
  Administered 2023-05-06: 50 mg via INTRAVENOUS

## 2023-05-06 MED ORDER — DEXMEDETOMIDINE HCL IN NACL 80 MCG/20ML IV SOLN
INTRAVENOUS | Status: DC | PRN
Start: 2023-05-06 — End: 2023-05-06
  Administered 2023-05-06: 8 ug via INTRAVENOUS

## 2023-05-06 MED ORDER — LIDOCAINE VISCOUS HCL 2 % MT SOLN
OROMUCOSAL | Status: AC
  Filled 2023-05-06: qty 15

## 2023-05-06 MED ORDER — LIDOCAINE VISCOUS HCL 2 % MT SOLN
15.0000 mL | Freq: Once | OROMUCOSAL | Status: AC
  Administered 2023-05-06: 15 mL via OROMUCOSAL

## 2023-05-06 MED ORDER — KETAMINE HCL 10 MG/ML IJ SOLN
INTRAMUSCULAR | Status: DC | PRN
Start: 2023-05-06 — End: 2023-05-06
  Administered 2023-05-06: 25 mg via INTRAVENOUS

## 2023-05-06 MED ORDER — KETAMINE HCL 50 MG/5ML IJ SOSY
PREFILLED_SYRINGE | INTRAMUSCULAR | Status: AC
  Filled 2023-05-06: qty 5

## 2023-05-06 MED ORDER — LACTATED RINGERS IV SOLN: INTRAVENOUS | Status: DC

## 2023-05-06 NOTE — Anesthesia Procedure Notes (Signed)
Date/Time: 05/06/2023 8:45 AM  Performed by: Julian Reil, CRNAPre-anesthesia Checklist: Patient identified, Emergency Drugs available, Suction available and Patient being monitored Patient Re-evaluated:Patient Re-evaluated prior to induction Oxygen Delivery Method: Nasal cannula Induction Type: IV induction Placement Confirmation: positive ETCO2 Comments: Optiflow High Flow [place] used for proc due to H/O OSA.

## 2023-05-06 NOTE — Transfer of Care (Signed)
Immediate Anesthesia Transfer of Care Note  Patient: Heather Fry  Procedure(s) Performed: ESOPHAGOGASTRODUODENOSCOPY (EGD) WITH PROPOFOL  Patient Location: Short Stay  Anesthesia Type:General  Level of Consciousness: awake  Airway & Oxygen Therapy: Patient Spontanous Breathing  Post-op Assessment: Report given to RN and Post -op Vital signs reviewed and stable  Post vital signs: Reviewed and stable  Last Vitals:  Vitals Value Taken Time  BP    Temp    Pulse    Resp    SpO2      Last Pain:  Vitals:   05/06/23 0846  TempSrc:   PainSc: 6          Complications: No notable events documented.

## 2023-05-06 NOTE — Anesthesia Postprocedure Evaluation (Signed)
Anesthesia Post Note  Patient: Heather Fry  Procedure(s) Performed: ESOPHAGOGASTRODUODENOSCOPY (EGD) WITH PROPOFOL BIOPSY  Patient location during evaluation: Phase II Anesthesia Type: General Level of consciousness: awake and alert and oriented Pain management: pain level controlled Vital Signs Assessment: post-procedure vital signs reviewed and stable Respiratory status: spontaneous breathing, nonlabored ventilation and respiratory function stable Cardiovascular status: blood pressure returned to baseline and stable Postop Assessment: no apparent nausea or vomiting Anesthetic complications: no  No notable events documented.   Last Vitals:  Vitals:   05/06/23 0730 05/06/23 0902  BP: (!) 152/91 114/62  Pulse:  100  Resp:  14  Temp:  36.7 C  SpO2:  97%    Last Pain:  Vitals:   05/06/23 0902  TempSrc: Oral  PainSc: 0-No pain                  C 

## 2023-05-06 NOTE — Interval H&P Note (Signed)
History and Physical Interval Note:  05/06/2023 8:34 AM  Heather Fry  has presented today for surgery, with the diagnosis of epig pain, refractory GERD..  The various methods of treatment have been discussed with the patient and family. After consideration of risks, benefits and other options for treatment, the patient has consented to  Procedure(s) with comments: ESOPHAGOGASTRODUODENOSCOPY (EGD) WITH PROPOFOL (N/A) - 8:00 AM, ASA 3 as a surgical intervention.  The patient's history has been reviewed, patient examined, no change in status, stable for surgery.  I have reviewed the patient's chart and labs.  Questions were answered to the patient's satisfaction.     Heather Fry     started on pantoprazole in the office recently reflux well-controlled waiting on Dexilant started Dexilant caused a headache she stopped taking it denies dysphagia.  Diagnostic EGD today per plan. The risks, benefits, limitations, alternatives and imponderables have been reviewed with the patient. Potential for esophageal dilation, biopsy, etc. have also been reviewed.  Questions have been answered. All parties agreeable.

## 2023-05-06 NOTE — Op Note (Signed)
[facility] Patient Name: Heather Fry Procedure Date: 05/06/2023 8:16 AM MRN: [id number] Date of Birth: [birth year] Attending MD: Gennette Pac , MD, [id number] CSN: [id number] Age: 47 Admit Type: Outpatient Procedure:                Upper GI endoscopy Indications:              Esophageal reflux Providers:                Gennette Pac, MD, Buel Ream. Thomasena Edis RN, RN,                            Elinor Parkinson, Dyann Ruddle Referring MD:              Medicines:                Propofol per Anesthesia Complications:            No immediate complications. Estimated Blood Loss:     Estimated blood loss was minimal. Procedure:                Pre-Anesthesia Assessment:                           - Prior to the procedure, a History and Physical                            was performed, and patient medications and                            allergies were reviewed. The patient's tolerance of                            previous anesthesia was also reviewed. The risks                            and benefits of the procedure and the sedation                            options and risks were discussed with the patient.                            All questions were answered, and informed consent                            was obtained. Prior Anticoagulants: The patient has                            taken no anticoagulant or antiplatelet agents. ASA                            Grade Assessment: III - A patient with severe                            systemic disease. After reviewing the risks and  benefits, the patient was deemed in satisfactory                            condition to undergo the procedure.                           After obtaining informed consent, the endoscope was                            passed under direct vision. Throughout the                            procedure, the patient's blood pressure, pulse, and                             oxygen saturations were monitored continuously. The                            [id number]-[id number] ([id number]) scope was introduced through the                            mouth, and advanced to the second part of duodenum.                            The upper GI endoscopy was accomplished without                            difficulty. The patient tolerated the procedure                            well. Scope In: 8:53:09 AM Scope Out: 8:59:35 AM Total Procedure Duration: 0 hours 6 minutes 26 seconds  Findings:      The examined esophagus was normal. Gastric cavity empty. Multiple polyps       present measuring anywhere from 5 mm to 1.5 cm. They all appear to be       benign. No ulcer or infiltrating process observed. Patent pylorus       normal-appearing first and second portion of the duodenum. Biopsies of       the polyps, gastric mucosa and duodenal mucosa taken for histologic       study. Impression:               - Normal esophagus. Gastric polyps. Status post                            polyp, gastric mucosa and duodenal mucosa biopsies                           -The patient states pantoprazole working better                            than Dexilant. Not taking her doses before meal Moderate Sedation:      Moderate (conscious) sedation was personally administered by an       anesthesia professional. The following parameters were monitored: oxygen  saturation, heart rate, blood pressure, respiratory rate, EKG, adequacy       of pulmonary ventilation, and response to care. Recommendation:           - Patient has a contact number available for                            emergencies. The signs and symptoms of potential                            delayed complications were discussed with the                            patient. Return to normal activities tomorrow.                            Written discharge instructions were provided to the                            patient.                            - Advance diet as tolerated. Follow-up on                            pathology. Continue pantoprazole 40 mg twice daily                            30 minutes before meals. Follow-up on pathology.                            Office visit with [place] in 3 months. Procedure Code(s):        --- Professional ---                           [phone], Esophagogastroduodenoscopy, flexible,                            transoral; diagnostic, including collection of                            specimen(s) by brushing or washing, when performed                            (separate procedure) Diagnosis Code(s):        --- Professional ---                           K21.9, Gastro-esophageal reflux disease without                            esophagitis CPT copyright 2022 American Medical Association. All rights reserved. The codes documented in this report are preliminary and upon coder review may  be revised to meet current compliance requirements. Gerrit Friends. , MD Gennette Pac, MD 05/06/2023 9:11:01 AM This report has been signed electronically. Number of Addenda: 0

## 2023-05-06 NOTE — Discharge Instructions (Signed)
EGD Discharge instructions Please read the instructions outlined below and refer to this sheet in the next few weeks. These discharge instructions provide you with general information on caring for yourself after you leave the hospital. Your doctor may also give you specific instructions. While your treatment has been planned according to the most current medical practices available, unavoidable complications occasionally occur. If you have any problems or questions after discharge, please call your doctor. ACTIVITY You may resume your regular activity but move at a slower pace for the next 24 hours.  Take frequent rest periods for the next 24 hours.  Walking will help expel (get rid of) the air and reduce the bloated feeling in your abdomen.  No driving for 24 hours (because of the anesthesia (medicine) used during the test).  You may shower.  Do not sign any important legal documents or operate any machinery for 24 hours (because of the anesthesia used during the test).  NUTRITION Drink plenty of fluids.  You may resume your normal diet.  Begin with a light meal and progress to your normal diet.  Avoid alcoholic beverages for 24 hours or as instructed by your caregiver.  MEDICATIONS You may resume your normal medications unless your caregiver tells you otherwise.  WHAT YOU CAN EXPECT TODAY You may experience abdominal discomfort such as a feeling of fullness or "gas" pains.  FOLLOW-UP Your doctor will discuss the results of your test with you.  SEEK IMMEDIATE MEDICAL ATTENTION IF ANY OF THE FOLLOWING OCCUR: Excessive nausea (feeling sick to your stomach) and/or vomiting.  Severe abdominal pain and distention (swelling).  Trouble swallowing.  Temperature over 101 F (37.8 C).  Rectal bleeding or vomiting of blood.     Gastric polyps in your stomach biopsies of your stomach and small intestine taken  your esophagus appeared normal  Continue Protonix or pantoprazole 40 mg twice daily  (take 30 minutes before a meal)   further recommendations to follow pending review of pathology report  Office visit with [place] in 3 months   at patient request, I called Iantha Fallen Grullon at [phone] -  reviewed findings and recommendations

## 2023-05-06 NOTE — Anesthesia Preprocedure Evaluation (Addendum)
Anesthesia Evaluation  Patient identified by MRN, date of birth, ID band Patient awake    Reviewed: Allergy & Precautions, H&P , NPO status , Patient's Chart, lab work & pertinent test results  Airway Mallampati: II  TM Distance: >3 FB Neck ROM: Full    Dental  (+) Dental Advisory Given, Missing, Poor Dentition, Chipped   Pulmonary asthma , sleep apnea , former smoker   Pulmonary exam normal breath sounds clear to auscultation       Cardiovascular hypertension, Pt. on medications + DOE  Normal cardiovascular exam Rhythm:Regular Rate:Normal   The left ventricular ejection fraction is normal (55-65%).  Nuclear stress EF: 63%.  There was no ST segment deviation noted during stress.  No T wave inversion was noted during stress.  The study is normal.  This is a low risk study.   1. Normal myocardial perfusion imaging study without evidence of ischemia or infarction.  2. Normal LVEF, 63%. Normal wall motion.  3. Low-risk study.    Gerri Spore T. O'Neal, MD [facility]  [address], [address] [place], [place] [address] [phone]  3:13 PM    Neuro/Psych  Headaches PSYCHIATRIC DISORDERS Anxiety Depression     Neuromuscular disease    GI/Hepatic Neg liver ROS,GERD  Medicated,,  Endo/Other  diabetes, Well Controlled, Type 2, Oral Hypoglycemic Agents, Insulin DependentHypothyroidism  Morbid obesity  Renal/GU negative Renal ROS  negative genitourinary   Musculoskeletal  (+) Arthritis , Osteoarthritis,    Abdominal   Peds negative pediatric ROS (+)  Hematology  (+) Blood dyscrasia, anemia   Anesthesia Other Findings   Reproductive/Obstetrics negative OB ROS                             Anesthesia Physical Anesthesia Plan  ASA: 3  Anesthesia Plan: General   Post-op Pain Management: Minimal or no pain anticipated   Induction: Intravenous  PONV Risk Score and  Plan: 1 and Propofol infusion  Airway Management Planned: Nasal Cannula and Natural Airway  Additional Equipment:   Intra-op Plan:   Post-operative Plan:   Informed Consent: I have reviewed the patients History and Physical, chart, labs and discussed the procedure including the risks, benefits and alternatives for the proposed anesthesia with the patient or authorized representative who has indicated his/her understanding and acceptance.     Dental advisory given  Plan Discussed with: CRNA and Surgeon  Anesthesia Plan Comments:        Anesthesia Quick Evaluation

## 2023-05-12 ENCOUNTER — Encounter (HOSPITAL_COMMUNITY): Payer: Self-pay | Admitting: Internal Medicine

## 2023-05-17 ENCOUNTER — Encounter: Payer: Self-pay | Admitting: Internal Medicine

## 2023-06-18 ENCOUNTER — Encounter (INDEPENDENT_AMBULATORY_CARE_PROVIDER_SITE_OTHER): Payer: Self-pay | Admitting: Otolaryngology

## 2023-06-18 ENCOUNTER — Ambulatory Visit (INDEPENDENT_AMBULATORY_CARE_PROVIDER_SITE_OTHER): Payer: Medicaid Other | Admitting: Otolaryngology

## 2023-06-18 VITALS — BP 146/91 | HR 88

## 2023-06-18 DIAGNOSIS — J3089 Other allergic rhinitis: Secondary | ICD-10-CM

## 2023-06-18 DIAGNOSIS — H6993 Unspecified Eustachian tube disorder, bilateral: Secondary | ICD-10-CM

## 2023-06-18 DIAGNOSIS — J302 Other seasonal allergic rhinitis: Secondary | ICD-10-CM

## 2023-06-18 NOTE — Progress Notes (Signed)
Otolaryngology Clinic Note Referring physician: Dr. Dellis Anes HPI:  Heather Fry is a 47 y.o. female kindly referred by Dr. Dellis Anes for evaluation of bilateral ear fullness.  PMHx: Allergic Asthma, Allergy, Graves (s/p thyroidectomy with Dr. Gerrit Friends in 2020) with Hypothyroidism, Fatty Liver, Diverticulosis, DM (Insulin as needed), Sleep Apnea (CPAP user), GERD  She reports that she has bilateral ear fullness with some pressure. She reports that she feels like "fluid moves" in her ears and some post nasal drip. They will also feel "clogged sometimes." Intermittent drainage (yellow?/cerumen?), rare vertigo (lasts seconds, when changes positions). She does reports worsening clogging with pressure or temperature changes. She will taker zofran/meclizine for these episodes but only takes them maybe once a month. She is on Flonase, Astelin, Zyrtec and these help some with her symptoms. No problems with her hearing.   She did see an ENT two years ago for allergies once, and now sees Dr. Dellis Anes for Allergy testing. She does report sinus infections 4-5 infections/year. Symptoms are headache, facial pressure, coughing/sneezing, discolored drainage (yellow drainge with some blood). Typically cephalexin and Z-pak - last time was 2 months ago. No trouble breathing of the nose. No prior nasal surgery.   She denies prior ear surgery, vestibular suppresant use (except as above), ear trauma, or other significant otologic history.   PMH/Meds/All/SocHx/FamHx/ROS:   Past Medical History:  Diagnosis Date   Allergic asthma    Anemia    Anxiety    Arthritis    Depression    Diabetes mellitus without complication (HCC)    Diverticulosis    Fatty liver    moderate to severe   GERD (gastroesophageal reflux disease)    Graves disease    Headache    tension, ocular migraines   Hypothyroidism    Internal hemorrhoids    Prolonged Q-T interval on ECG    Sleep apnea    Thyroid disease    Uterine fibroid      Past Surgical History:  Procedure Laterality Date   ABDOMINAL HYSTERECTOMY     Per patient, uterine fibroids.   ABDOMINAL HYSTERECTOMY     BIOPSY  05/06/2023   Procedure: BIOPSY;  Surgeon: Corbin Ade, MD;  Location: [facility];  Service: Endoscopy;;   CARPAL TUNNEL RELEASE Right 06/11/2021   Procedure: Right Carpel tunnel release;  Surgeon: Bedelia Person, MD;  Location: [facility] OR;  Service: Neurosurgery;  Laterality: Right;  RM 18   CHOLECYSTECTOMY N/A 11/06/2014   Procedure: LAPAROSCOPIC CHOLECYSTECTOMY;  Surgeon: Dalia Heading, MD;  Location: [facility];  Service: General;  Laterality: N/A;   COLONOSCOPY N/A 03/04/2017   Dr. Jena Gauss: Hemorrhoids, diverticulosis, benign polyp without adenomatous changes.  Next colonoscopy 10 years   COLONOSCOPY WITH PROPOFOL N/A 08/28/2022   Procedure: COLONOSCOPY WITH PROPOFOL;  Surgeon: Corbin Ade, MD;  Location: [facility];  Service: Endoscopy;  Laterality: N/A;  9:00 am   DILATION AND CURETTAGE OF UTERUS     ESOPHAGOGASTRODUODENOSCOPY N/A 10/02/2014   Dr. Jena Gauss: fundic gland polyps, hiatal hernia   ESOPHAGOGASTRODUODENOSCOPY  07/2020   [facility] ; normal esophagus biopsied, benign-appearing gastric polyps biopsied, gastric biopsies obtained to evaluate for H. pylori, normal duodenum.  Pathology with mild chronic gastritis, negative for H. pylori, fundic gland polyps, esophageal biopsy benign.   ESOPHAGOGASTRODUODENOSCOPY (EGD) WITH PROPOFOL N/A 05/06/2023   Procedure: ESOPHAGOGASTRODUODENOSCOPY (EGD) WITH PROPOFOL;  Surgeon: Corbin Ade, MD;  Location: [facility];  Service: Endoscopy;  Laterality: N/A;  8:00 AM, ASA 3  MOUTH SURGERY     extraction of teeth   POLYPECTOMY  03/04/2017   Procedure: POLYPECTOMY;  Surgeon: Corbin Ade, MD;  Location: [facility];  Service: Endoscopy;;  colon   RIGHT OOPHORECTOMY  07/2021   THYROIDECTOMY N/A 09/30/2018   Procedure: TOTAL THYROIDECTOMY;  Surgeon: Darnell Level, MD;   Location: [facility];  Service: General;  Laterality: N/A;   TUBAL LIGATION      Family History  Problem Relation Age of Onset   Allergic rhinitis Mother    Uterine cancer Mother        ?cervical cancer   Allergic rhinitis Father    Colon cancer Father 54   Hypertension Father    Diabetes Father    Colon polyps Father        age less than 74   Uterine cancer Sister        suicide   Colon cancer Paternal Aunt 41       also had skin cancer, ovarian?   Depression Other    Thyroid disease Neg Hx    Pancreatic cancer Neg Hx    No family history of bleeding disorders or difficulty with anesthesia  Social Connections: Socially Integrated (08/05/2022)   Social Connection and Isolation Panel [NHANES]    Frequency of Communication with Friends and Family: More than three times a week    Frequency of Social Gatherings with Friends and Family: More than three times a week    Attends Religious Services: More than 4 times per year    Active Member of [employer] or Organizations: Yes    Attends Engineer, structural: More than 4 times per year    Marital Status: Married      Current Outpatient Medications:    acarbose (PRECOSE) 100 MG tablet, Take 100 mg by mouth daily as needed (blood glucose above 160)., Disp: , Rfl:    albuterol (VENTOLIN HFA) 108 (90 Base) MCG/ACT inhaler, Inhale 2 puffs into the lungs every 6 (six) hours as needed., Disp: 18 g, Rfl: 11   Azelastine HCl 0.15 % SOLN, Place 2 sprays into both nostrils 2 (two) times daily., Disp: 30 mL, Rfl: 5   cetirizine (ZYRTEC) 10 MG tablet, Take 10 mg by mouth daily., Disp: , Rfl:    Cholecalciferol (VITAMIN D3) 125 MCG (5000 UT) CAPS, Take 1 capsule (5,000 Units total) by mouth daily. (Patient taking differently: Take 10,000 Units by mouth daily.), Disp: 30 capsule, Rfl: 11   Continuous Blood Gluc Sensor (DEXCOM G7 SENSOR) MISC, USE AS DIRECTED EVERY 10 DAYS, Disp: , Rfl:    EPINEPHrine 0.3 mg/0.3 mL IJ SOAJ injection, Inject 0.3 mg  into the muscle as needed for anaphylaxis., Disp: 2 each, Rfl: 1   EQUATE STOOL SOFTENER 100 MG capsule, Take 100 mg by mouth 2 (two) times daily as needed for moderate constipation., Disp: , Rfl:    escitalopram (LEXAPRO) 20 MG tablet, Take 1 tablet (20 mg total) by mouth at bedtime., Disp: 30 tablet, Rfl: 5   FIBER PO, Take by mouth., Disp: , Rfl:    fluticasone (FLONASE) 50 MCG/ACT nasal spray, Place 2 sprays into both nostrils at bedtime as needed for allergies., Disp: , Rfl:    glimepiride (AMARYL) 1 MG tablet, Take 1 mg by mouth daily as needed (blood glucose above 200)., Disp: , Rfl:    glucose blood (ACCU-CHEK AVIVA PLUS) test strip, 1 each by Other route daily. And lancets 1/day, Disp: 100 each, Rfl: 3   insulin  glargine (LANTUS) 100 UNIT/ML injection, Inject 20 Units into the skin daily., Disp: , Rfl:    ipratropium (ATROVENT) 0.02 % nebulizer solution, INHALE 1 VIAL IN NEBULIZER EVERY 6 HOURS AS NEEDED, Disp: 180 mL, Rfl: 0   Magnesium 250 MG TABS, Take 250 mg by mouth at bedtime. , Disp: , Rfl:    meclizine (ANTIVERT) 25 MG tablet, Take 25 mg by mouth 4 (four) times daily as needed for dizziness., Disp: , Rfl:    NON FORMULARY, at bedtime. CPAP at night, Disp: , Rfl:    rosuvastatin (CRESTOR) 10 MG tablet, Take 10 mg by mouth every Saturday., Disp: , Rfl:    Simethicone 250 MG CAPS, Take 500 mg by mouth at bedtime., Disp: , Rfl:    sucralfate (CARAFATE) 1 GM/10ML suspension, TAKE 10 ML BY MOUTH  4 TIMES DAILY AS NEEDED, Disp: 828 mL, Rfl: 0   SYNTHROID 137 MCG tablet, Take 137 mcg by mouth daily before breakfast., Disp: , Rfl:    dexlansoprazole (DEXILANT) 60 MG capsule, Take 1 capsule (60 mg total) by mouth daily before breakfast. (Patient not taking: Reported on 06/18/2023), Disp: 90 capsule, Rfl: 3  A 20-point ROS was performed with pertinent positives/negatives noted in the HPI.    Physical Exam:   BP (!) 164/96 (BP Location: Left Arm, Patient Position: Sitting, Cuff Size: Large)    Pulse 88   LMP 10/09/2014   SpO2 99%    Salient findings:  CN II-XII intact Bilateral EAC clear and TM intact with well pneumatized middle ear spaces; mild dryness but no obvious eczematous changes with some dry cerumen up against TM bilaterally; pneumatic otoscopy with appropriate movement Weber 512: midline Rinne 512: AC > BC b/l  Rine 1024: AC > BC b/l  Anterior rhinoscopy: Septum mild deviated right; bilateral inferior turbinates without hypertrophy; no polyps noted, mucosa mildly dry No lesions of oral cavity/oropharynx; dentition fairly poor with several missing teeth No obviously palpable neck masses/lymphadenopathy/thyromegaly No respiratory distress or stridor  Independent Review of Additional Tests or Records:  CT Angio Neck 02/2021 independently reviewed with attention to head and sinuses given sinonasal complaints: right septal deviation; L>R ITH but otherwise paranasal sinuses without significant disease; no mastoid opacification without ME effusion  Procedures:  None  Impression & Plans:  Heather Fry is a 47 y.o. female with complex medical history as noted in HPI with symptoms of bilateral ear fullness, ear clogging most consistent with bilateral eustachian tube dysfunction. She does not have consistent "vertigo" but rather lightheadedness and hearing is grossly intact.  Bilateral ear fullness, pressure - consistent with eustachian tube dysfunction Query BPPV but asymptomatic currently We discussed her options including R/B/A/ for each option - observation, trial of mineral oil or otic drops given dryness in ear and some cerumen v/s trial of myringotomy or PE tubes Given lack of effusion or consistently significant symptoms, she would like to try a trial of drops We discussed hearing test but she wished to forego  3. Allergic Rhinitis - Continue flonsae and antihistamine and astelin per Dr. Dellis Anes - She reports sinonasal infections but given lack of opacification  on prior CT, wishes to observe - Can offer septo/turbs in case she has frequent exacerbations but hold off for now  - Mineral oil drops 3 drops BID twice per week - RTC PRN after discussion of scheduled follow up but she wishes to call as needed  MDM:  Level 4: 99204 Complexity/Problems addressed: mod - two stable illnesses Data complexity: mod (  CT review) - Morbidity: low   Thank you for allowing me the opportunity to care for your patient. Please do not hesitate to contact me should you have any other questions.  Sincerely, Jovita Kussmaul, MD Otolarynoglogist (ENT), [facility] Specialist Phone: [phone] Fax: [phone]  06/18/2023, 10:49 AM

## 2023-06-25 ENCOUNTER — Institutional Professional Consult (permissible substitution) (INDEPENDENT_AMBULATORY_CARE_PROVIDER_SITE_OTHER): Payer: Medicaid Other | Admitting: Otolaryngology

## 2023-07-07 NOTE — Patient Instructions (Incomplete)
1. Moderate persistent asthma, uncomplicated -I will have you sign a release of information to get a copy of  your chest x-ray that was completed yesterday at urgent care. - Daily controller medication(s): Start Alvesco 2 puffs twice daily with spacer. Rinse mouth out after - Rescue medications: Atrovent (ipratropium) one treatment daily as needed OR Xopenex nebulizer treatment every 6 hours as needed. - Asthma control goals:  * Full participation in all desired activities (may need albuterol before activity) * Albuterol use two time or less a week on average (not counting use with activity) * Cough interfering with sleep two time or less a month * Oral steroids no more than once a year * No hospitalizations  2. Perennial and seasonal allergic rhinitis - Previous testing showed: indoor molds, outdoor molds, and dust mites. - We are obviously not doing any allergy shots!  - Continue with: Flonase and Zyrtec - Continue with: Astelin (azelastine) 2 sprays per nostril 1-2 times daily as needed (at least use during these emerging sinus issues)  - You can use an extra dose of the antihistamine, if needed, for breakthrough symptoms.  - Consider nasal saline rinses 1-2 times daily to remove allergens from the nasal cavities as well as help with mucous clearance (this is especially helpful to do before the nasal sprays are given) - Continue to follow up with ENT as needed  3. Recurrent infections - Work up was normal thankfully. - I do not think that we need to do anything further at this point in time.   4. Stinging insect allergy - Hymenoptera panel and tryptase normal.   Your blood pressure 5. Follow up in 4-6 weeks or sooner

## 2023-07-08 ENCOUNTER — Other Ambulatory Visit: Payer: Self-pay

## 2023-07-08 ENCOUNTER — Telehealth: Payer: Self-pay

## 2023-07-08 ENCOUNTER — Ambulatory Visit (INDEPENDENT_AMBULATORY_CARE_PROVIDER_SITE_OTHER): Payer: Medicaid Other | Admitting: Family

## 2023-07-08 ENCOUNTER — Encounter: Payer: Self-pay | Admitting: Family

## 2023-07-08 VITALS — BP 140/70 | HR 111 | Temp 98.4°F | Resp 16 | Ht 63.0 in | Wt 270.6 lb

## 2023-07-08 DIAGNOSIS — J3089 Other allergic rhinitis: Secondary | ICD-10-CM

## 2023-07-08 DIAGNOSIS — J4540 Moderate persistent asthma, uncomplicated: Secondary | ICD-10-CM | POA: Diagnosis not present

## 2023-07-08 DIAGNOSIS — Z889 Allergy status to unspecified drugs, medicaments and biological substances status: Secondary | ICD-10-CM

## 2023-07-08 DIAGNOSIS — B999 Unspecified infectious disease: Secondary | ICD-10-CM

## 2023-07-08 DIAGNOSIS — J302 Other seasonal allergic rhinitis: Secondary | ICD-10-CM

## 2023-07-08 MED ORDER — ALVESCO 80 MCG/ACT IN AERS: INHALATION_SPRAY | RESPIRATORY_TRACT | 0 refills | Status: DC

## 2023-07-08 NOTE — Telephone Encounter (Signed)
Medical record request form has been sent to [facility] at fax# [phone]. From has was placed in red folder.

## 2023-07-08 NOTE — Progress Notes (Signed)
[address] [place] [address] Dept: [phone]  FOLLOW UP NOTE  Patient ID: Heather Fry, female    DOB: 11/22/1975  Age: 47 y.o. MRN: [id number] Date of Office Visit: 07/08/2023  Assessment  Chief Complaint: Nasal Congestion (With chest congestion x 2 weeks)  HPI Heather Fry is a 47 year old female who presents today for an acute visit. She was last seen on 04/29/23 by Dr. Dellis Anes for  moderate persistent asthma, intolerant of inhaled corticosteroids due to recurrent thrush, seasonal and perennial allergic rhinitis,and multiple drug allergies vs. intolerances. She denies any new diagnosis or surgeries since her last office visit. Her uncle is here with her today.  She reports that she has been congested for the past couple weeks. Her primary care physician gave her a Zpak that she finished on Monday. She went to urgent care yesterday due to her continued symptoms and had a chest x-ray that she reports was normal.She was told by urgent care to rest and give longer time for the antibiotic to work. I am not able to review the chest x-ray. She reports her lungs are burning, shortness of breath, sensation of a band being on her chest, productive cough at times with milky white sputum, and tightness in her chest. She denies wheezing and fever,but reports chills the week before last. When she does a breathing treatment it helps her symptoms. She reports not having good luck finding a maintenance inhaler that she can tolerate. One inhaler gave her thrush and when she took Nystatin it caused anaphylaxis. Spiriva causes her stomach to hurt bad. She is not able use Qvar that was prescribed at her last office visit because of her medically induced long QT. She reports that her heart doctor does not want her on that type of medication. She has had decent luck with methylprednisolone. She can tolerate 2-3 days worth. She cannot do prednisone shots due to pancreatitis.   She  wonders why no one has checked her lungs for polyps. She did take 2 days worth of steroids last week.   She reports that her blood pressure is up when her asthma is flaring.   Perennial allergic rhinitis: She reports clear rhinorrhea, nasal congestion, and post nasal drip.She did report a sore throat due to post nasal drip,but this is now better. She is currently taking Zyrtec and Flonase daily. She uses Astelin nasal spray as needed because it dries her out. She did see ENT for ear fullness and is now using mineral oil with cotton. She thinks that she was given the Zpak recently for an upper respiratory infection.   Stinging insect allergy: She reports that she has not had any insect stings since her last office visit.  She wears her CPAP every time she closes her eyes.   Drug Allergies:  Allergies  Allergen Reactions   Bee Venom Shortness Of Breath, Swelling and Palpitations   Ketorolac Tromethamine Anaphylaxis, Hives and Other (See Comments)   Hydrochlorothiazide Other (See Comments)    Dehydration  Dehydration, Dehydration   Levofloxacin Other (See Comments)    Pt can not have due to taking Lexapro   Levomenol Other (See Comments)   Other Other (See Comments)   Tramadol Hives   Triamcinolone Acetonide Other (See Comments)    Elevated blood pressure, caused diabetes, changed tyroid electrolytes Patient has no tyroid     Budesonide-Formoterol Fumarate Rash   Lisinopril Nausea Only and Other (See Comments)   Losartan Potassium Nausea  Only   Nystatin Rash    oral   Penicillins Other (See Comments)    Loopy, childhood allergy  ALL CILLINS  Unknown childhood reactions Has patient had a PCN reaction causing anaphylaxis, immediate rash, facial/tongue/throat swelling, SOB or lightheadedness with hypotension? no, Has patient had a PCN reaction causing severe rash involving mucus membranes or skin necrosis?  no, Has patient had a PCN reaction that required hospitilization?no, , Has  patient had a PCN reaction occurring within the last 10 years?  no, If all of the above answers are "no" then may proceed with cephalosporin use.   Wound Dressing Adhesive Rash    Review of Systems: Negative except as per HPI   Physical Exam: BP (!) 140/70   Pulse (!) 111   Temp 98.4 F (36.9 C)   Resp 16   Ht 5\' 3"  (1.6 m)   Wt 270 lb 9.6 oz (122.7 kg)   LMP 10/09/2014   SpO2 97%   BMI 47.93 kg/m    Physical Exam Constitutional:      Appearance: Normal appearance.  HENT:     Head: Normocephalic and atraumatic.     Comments: Pharynx normal. Eyes normal. Ears normal. Nose normal.    Right Ear: Tympanic membrane, ear canal and external ear normal.     Left Ear: Tympanic membrane, ear canal and external ear normal.     Nose: Nose normal.     Mouth/Throat:     Mouth: Mucous membranes are moist.     Pharynx: Oropharynx is clear.  Eyes:     Conjunctiva/sclera: Conjunctivae normal.  Cardiovascular:     Rate and Rhythm: Regular rhythm.     Heart sounds: Normal heart sounds.  Pulmonary:     Effort: Pulmonary effort is normal.     Breath sounds: Normal breath sounds.     Comments: Lungs clear to auscultation Musculoskeletal:     Cervical back: Neck supple.  Skin:    General: Skin is warm.     Comments: Face flushed  Neurological:     Mental Status: She is alert and oriented to person, place, and time.  Psychiatric:        Mood and Affect: Mood normal.        Behavior: Behavior normal.        Thought Content: Thought content normal.        Judgment: Judgment normal.     Diagnostics:  FVC 3.00 L ( 88%), FEV1 2.60 L (94%), FEV1/FVC 0.87. Spirometry indicates normal spirometry  Assessment and Plan: 1. Not well controlled moderate persistent asthma   2. Seasonal and perennial allergic rhinitis   3. Recurrent infections   4. Multiple drug allergies     Meds ordered this encounter  Medications   ciclesonide (ALVESCO) 80 MCG/ACT inhaler    Sig: Inhale 2 puffs twice  a day with spacer. Rinse mouth out after    Dispense:  1 each    Refill:  0    Patient Instructions  1. Moderate persistent asthma, uncomplicated -I will have you sign a release of information to get a copy of  your chest x-ray that was completed yesterday at urgent care. - Daily controller medication(s): Start Alvesco 2 puffs twice daily with spacer. Rinse mouth out after - Rescue medications: Atrovent (ipratropium) one treatment daily as needed OR Xopenex nebulizer treatment every 6 hours as needed. - Asthma control goals:  * Full participation in all desired activities (may need albuterol before activity) * Albuterol  use two time or less a week on average (not counting use with activity) * Cough interfering with sleep two time or less a month * Oral steroids no more than once a year * No hospitalizations  2. Perennial and seasonal allergic rhinitis - Previous testing showed: indoor molds, outdoor molds, and dust mites. - We are obviously not doing any allergy shots!  - Continue with: Flonase and Zyrtec - Continue with: Astelin (azelastine) 2 sprays per nostril 1-2 times daily as needed (at least use during these emerging sinus issues)  - You can use an extra dose of the antihistamine, if needed, for breakthrough symptoms.  - Consider nasal saline rinses 1-2 times daily to remove allergens from the nasal cavities as well as help with mucous clearance (this is especially helpful to do before the nasal sprays are given) - Continue to follow up with ENT as needed  3. Recurrent infections - Work up was normal thankfully. - I do not think that we need to do anything further at this point in time.   4. Stinging insect allergy - Hymenoptera panel and tryptase normal.   Your blood pressure 5. Follow up in 4-6 weeks or sooner      Return in about 6 weeks (around 08/19/2023), or if symptoms worsen or fail to improve.    Thank you for the opportunity to care for this patient.   Please do not hesitate to contact me with questions.  Nehemiah Settle, FNP [facility]

## 2023-07-10 ENCOUNTER — Other Ambulatory Visit: Payer: Self-pay

## 2023-07-10 ENCOUNTER — Emergency Department (HOSPITAL_COMMUNITY)
Admission: EM | Admit: 2023-07-10 | Discharge: 2023-07-10 | Discharge status: Against Medical Advice | Payer: Medicaid Other | Attending: Emergency Medicine | Admitting: Emergency Medicine

## 2023-07-10 ENCOUNTER — Emergency Department (HOSPITAL_COMMUNITY): Payer: Medicaid Other

## 2023-07-10 ENCOUNTER — Encounter (HOSPITAL_COMMUNITY): Payer: Self-pay

## 2023-07-10 DIAGNOSIS — J45909 Unspecified asthma, uncomplicated: Secondary | ICD-10-CM | POA: Insufficient documentation

## 2023-07-10 DIAGNOSIS — R0602 Shortness of breath: Secondary | ICD-10-CM | POA: Insufficient documentation

## 2023-07-10 DIAGNOSIS — Z5321 Procedure and treatment not carried out due to patient leaving prior to being seen by health care provider: Secondary | ICD-10-CM | POA: Diagnosis not present

## 2023-07-10 NOTE — ED Triage Notes (Signed)
Asthma x2-3 years after thyroid removal.   Heaviness to breathe x15 mins Feels sleepy and hot SOB decreases at rest   Take duo neds and ventolin rescue inhaler. Finished ABX 3 days ago for URI

## 2023-07-10 NOTE — ED Notes (Signed)
Registration informed CN that pt left.

## 2023-08-04 ENCOUNTER — Ambulatory Visit (INDEPENDENT_AMBULATORY_CARE_PROVIDER_SITE_OTHER): Payer: Medicaid Other | Admitting: Gastroenterology

## 2023-08-04 ENCOUNTER — Encounter: Payer: Self-pay | Admitting: Gastroenterology

## 2023-08-04 VITALS — BP 146/90 | HR 92 | Temp 98.6°F | Ht 64.0 in | Wt 273.6 lb

## 2023-08-04 DIAGNOSIS — K760 Fatty (change of) liver, not elsewhere classified: Secondary | ICD-10-CM

## 2023-08-04 DIAGNOSIS — K589 Irritable bowel syndrome without diarrhea: Secondary | ICD-10-CM | POA: Diagnosis not present

## 2023-08-04 DIAGNOSIS — K219 Gastro-esophageal reflux disease without esophagitis: Secondary | ICD-10-CM | POA: Diagnosis not present

## 2023-08-04 DIAGNOSIS — K2100 Gastro-esophageal reflux disease with esophagitis, without bleeding: Secondary | ICD-10-CM

## 2023-08-04 DIAGNOSIS — R1013 Epigastric pain: Secondary | ICD-10-CM

## 2023-08-04 NOTE — Progress Notes (Signed)
GI Office Note    Referring Provider: Elfredia Nevins, MD Primary Care Physician:  Elfredia Nevins, MD  Primary Gastroenterologist: Roetta Sessions, MD   Chief Complaint   Chief Complaint  Patient presents with   Follow-up    History of Present Illness   Heather Fry is a 47 y.o. female presenting today for follow up. Last seen 03/2023. H/O mixed IBS, GERD, chronic RUQ pain, fatty liver.   Typically ends up switching out pantoprazole or omeprazole every 6 months or so because they seem to lose efficacy. Previously failed Nexium, Zegerid, Tagamet. Has never tried Dexilant or Aciphex. States her PCP prescribed Dexilant recently but cannot get it covered. Requires a prior authorization.   Started on pantoprazole BID at last ov. Then switched to dexilant but felt like it caused upper abd pain, requiring ED visit. Went back to pantoprazole. EGD as outlined.   From GI standpoint she has been feeling better.  Pantoprazole twice daily helping.  Occasionally uses Carafate but generally only has to have 1 dose when she does needed.  Has been on antibiotics for upper respiratory infections.  Stopped her last 1 about 3 days ago, states it was causing abdominal pain and pain "in my liver".  Those symptoms are settling down now.  Bowel movements have been good.  Really has not required any dicyclomine routinely except for a couple of times while on antibiotics recently.  No melena or rectal bleeding  She is concerned regarding her steatosis.  She really wants to optimize her health.  She feels like her weight is contributing to a lot of her issues but she does not feel like she can routinely exercise due to joint pain.  Currently taking rosuvastatin twice weekly only because she typically develops chest pain with the medication.  She plans to try to add a third dose to optimize her cholesterol.  She states she has been referred to healthy weight and wellness a couple times before but she never  followed through.  She plans to call today to talk about making an appointment.  She also has tried Ozempic for her diabetes and for weight reduction, believes she was only on the lowest dose but developed abdominal pain and she stopped the medication.  Tends to be very sensitive to medications.    EGD 04/2023: -normal esophagus -gastric polyps, s/p biopsies, benign -esophageal biopsies with reflux changes -duodenal biopsies benign  Colonoscopy 07/2022: -diverticulosis -redundant colon -screening colonoscopy in 5 years due to positive FH of CRC.    Prior work up:   Colonoscopy in 2018 unremarkable.     EGD from November 2021 at [facility], gastritis, biopsies negative for H. pylori, fundic gland polyps.   CT pancreatic protocol completed August 2022 with no pancreatic abnormalities.  She had diffuse hepatic steatosis and a focal hyperdense nonenhancing nodularity along the posterior aspect of the right lobe of the liver measuring 11 mm that appears stable at least since 2019 and consistent with benign/indolent process.  MRI abdomen with and without contrast August 2022 revealed 1 cm subcapsular hemorrhagic cyst in the posterior right hepatic lobe, moderate to severe hepatic steatosis, incompletely visualized cystic lesion in the upper right pelvis measuring 8 cm with inability to exclude ovarian neoplasm.  MRI pelvis with and without contrast completed September 2022 with large cystic lesion associated with the right ovary measuring up to 9.7 cm.  Differential including borderline tumor or large cyst with involution of adjacent smaller cyst.  GYN evaluation recommended.   Patient completed laparoscopic right oophorectomy November 2022.   In February 2023, sed rate 47, CRP 28.  Patient denies any autoimmune diagnosis.  In May 2022 sed rate 39, CRP 25.5.  RA screen negative, anti-CCP less than 5, complement C4 33, normal.  Complement C3 177 normal.  Sjogren's antibody less  than 0.2 normal, ANA negative, adolase S 5.3, CK 68 normal. Wbc 8800, hemoglobin 12.9, platelets 281,000, glucose 155, BUN 10, creatinine 0.79, albumin 4.6, total bilirubin less than 0.2, alk phos 73, AST 22, ALT 28.   She has had elevated lipase in the 200 range intermittently dating back as far as 2019.  Highest lipase of 257 in July 2022.  LFTs have been normal aside from slight elevation of AST at 41 in June 2022. History of cholecystectomy.  States that she was given steroids once and it caused pancreatitis.     Medications   Current Outpatient Medications  Medication Sig Dispense Refill   acarbose (PRECOSE) 100 MG tablet Take 100 mg by mouth daily as needed (blood glucose above 160).     albuterol (VENTOLIN HFA) 108 (90 Base) MCG/ACT inhaler Inhale 2 puffs into the lungs every 6 (six) hours as needed. 18 g 11   Azelastine HCl 0.15 % SOLN Place 2 sprays into both nostrils 2 (two) times daily. 30 mL 5   cetirizine (ZYRTEC) 10 MG tablet Take 10 mg by mouth daily.     Cholecalciferol (VITAMIN D3) 125 MCG (5000 UT) CAPS Take 1 capsule (5,000 Units total) by mouth daily. (Patient taking differently: Take 10,000 Units by mouth daily.) 30 capsule 11   ciclesonide (ALVESCO) 80 MCG/ACT inhaler Inhale 2 puffs twice a day with spacer. Rinse mouth out after 1 each 0   Continuous Blood Gluc Sensor (DEXCOM G7 SENSOR) MISC USE AS DIRECTED EVERY 10 DAYS     EPINEPHrine 0.3 mg/0.3 mL IJ SOAJ injection Inject 0.3 mg into the muscle as needed for anaphylaxis. 2 each 1   EQUATE STOOL SOFTENER 100 MG capsule Take 100 mg by mouth 2 (two) times daily as needed for moderate constipation.     escitalopram (LEXAPRO) 20 MG tablet Take 1 tablet (20 mg total) by mouth at bedtime. 30 tablet 5   FIBER PO Take by mouth.     fluticasone (FLONASE) 50 MCG/ACT nasal spray Place 2 sprays into both nostrils at bedtime as needed for allergies.     glimepiride (AMARYL) 1 MG tablet Take 1 mg by mouth daily as needed (blood  glucose above 200).     glucose blood (ACCU-CHEK AVIVA PLUS) test strip 1 each by Other route daily. And lancets 1/day 100 each 3   insulin glargine (LANTUS) 100 UNIT/ML injection Inject 20 Units into the skin daily.     ipratropium (ATROVENT) 0.02 % nebulizer solution INHALE 1 VIAL IN NEBULIZER EVERY 6 HOURS AS NEEDED 180 mL 0   Magnesium 250 MG TABS Take 250 mg by mouth at bedtime.      meclizine (ANTIVERT) 25 MG tablet Take 25 mg by mouth 4 (four) times daily as needed for dizziness.     NON FORMULARY at bedtime. CPAP at night     pantoprazole (PROTONIX) 40 MG tablet Take 40 mg by mouth 2 (two) times daily.     rosuvastatin (CRESTOR) 10 MG tablet Take 10 mg by mouth every Saturday.     Simethicone 250 MG CAPS Take 500 mg by mouth at bedtime.     sucralfate (CARAFATE)  1 GM/10ML suspension TAKE 10 ML BY MOUTH  4 TIMES DAILY AS NEEDED 828 mL 0   SYNTHROID 137 MCG tablet Take 137 mcg by mouth daily before breakfast.     No current facility-administered medications for this visit.    Allergies   Allergies as of 08/04/2023 - Review Complete 08/04/2023  Allergen Reaction Noted   Bee venom Shortness Of Breath, Swelling, and Palpitations 03/31/2023   Ketorolac tromethamine Anaphylaxis, Hives, and Other (See Comments) 06/08/2019   Hydrochlorothiazide Other (See Comments) 06/22/2020   Levofloxacin Other (See Comments) 03/03/2017   Levomenol Other (See Comments) 03/06/2020   Other Other (See Comments) 12/17/2015   Tramadol Hives 01/11/2021   Triamcinolone acetonide Other (See Comments) 01/02/2021   Budesonide-formoterol fumarate Rash 04/29/2023   Lisinopril Nausea Only and Other (See Comments) 01/18/2018   Losartan potassium Nausea Only 06/22/2020   Nystatin Rash 04/04/2022   Penicillins Other (See Comments) 04/06/2012   Wound dressing adhesive Rash 04/29/2023       Review of Systems   General: Negative for anorexia, weight loss, fever, chills, fatigue, weakness. ENT: Negative for  hoarseness, difficulty swallowing , nasal congestion. CV: Negative for chest pain, angina, palpitations, +dyspnea on exertion, peripheral edema.  Respiratory: Negative for dyspnea at rest, +dyspnea on exertion, +cough, sputum, wheezing.  GI: See history of present illness. GU:  Negative for dysuria, hematuria, urinary incontinence, urinary frequency, nocturnal urination.  Endo: Negative for unusual weight change.     Physical Exam   BP (!) 146/90 (BP Location: Left Arm, Patient Position: Sitting, Cuff Size: Large)   Pulse 92   Temp 98.6 F (37 C) (Oral)   Ht 5\' 4"  (1.626 m)   Wt 273 lb 9.6 oz (124.1 kg)   LMP 10/09/2014   SpO2 98%   BMI 46.96 kg/m    General: Well-nourished, well-developed in no acute distress.  Eyes: No icterus. Mouth: Oropharyngeal mucosa moist and pink Lungs: Clear to auscultation bilaterally.  Heart: Regular rate and rhythm, no murmurs rubs or gallops.  Abdomen: Bowel sounds are normal, nontender, nondistended, no hepatosplenomegaly or masses, no abdominal bruits or hernia , no rebound or guarding. Rectus diastasis Rectal: not performed  Extremities: No lower extremity edema. No clubbing or deformities. Neuro: Alert and oriented x 4   Skin: Warm and dry, no jaundice.   Psych: Alert and cooperative, normal mood and affect.  Labs   Lab Results  Component Value Date   NA 138 04/30/2023   CL 103 04/30/2023   K 3.9 04/30/2023   CO2 25 04/30/2023   BUN 9 04/30/2023   CREATININE 0.63 04/30/2023   GFRNONAA >60 04/30/2023   CALCIUM 9.6 04/30/2023   PHOS 3.8 11/07/2022   ALBUMIN 3.9 04/30/2023   GLUCOSE 160 (H) 04/30/2023   Lab Results  Component Value Date   ALT 35 04/30/2023   AST 28 04/30/2023   ALKPHOS 55 04/30/2023   BILITOT 0.5 04/30/2023   Lab Results  Component Value Date   WBC 8.6 04/30/2023   HGB 13.0 04/30/2023   HCT 39.3 04/30/2023   MCV 85.6 04/30/2023   PLT 257 04/30/2023   Lab Results  Component Value Date   INR 0.9 04/29/2023    INR 1.0 01/19/2020   Lab Results  Component Value Date   WBC 8.6 04/30/2023   HGB 13.0 04/30/2023   HCT 39.3 04/30/2023   MCV 85.6 04/30/2023   PLT 257 04/30/2023   NASH Fibrosure: F0-no fibrosis, S2-S3 moderate to severe steatosis, N1-mild NASH  Imaging  Studies   DG Chest 2 View  Result Date: 07/10/2023 CLINICAL DATA:  Shortness of breath and fatigue EXAM: CHEST - 2 VIEW COMPARISON:  Radiographs 10/07/2022 FINDINGS: The heart size and mediastinal contours are within normal limits. Both lungs are clear. The visualized skeletal structures are unremarkable. IMPRESSION: No active cardiopulmonary disease. Electronically Signed   By: Minerva Fester M.D.   On: 07/10/2023 15:58    Assessment   GERD/epigastric pain -improved -continue pantoprazole 40 mg once to twice daily before meals -Continue sucralfate as needed -Reinforced antireflux measures -Recommend weight reduction  IBS -Doing well  Fatty liver -NASH Fibrosure: F0-no fibrosis, S2-S3 moderate to severe steatosis, N1-mild NASH -Recommend tight glycemic control, management of cholesterol.  She plans to increase her rosuvastatin to 3 times weekly if tolerated -Recommend weight reduction, she plans to check out healthy weight and wellness.  She can discuss resuming Ozempic with her PCP if advised and tolerated. -We will continue to follow her every 6 months with labs -Consider updating imaging in the future, reassess after next labs etc.      Leanna Battles. Melvyn Neth, MHS, PA-C [facility]

## 2023-08-04 NOTE — Patient Instructions (Addendum)
Continue pantoprazole 40 mg once to twice daily before meals to control acid reflux and abdominal pain. Continue sucralfate liquid 4 times daily as needed. For fatty liver, continue to work towards healthier weight, diabetes and cholesterol management.  You may benefit from following up with healthy weight and wellness to provide additional dietary guidelines and assist with weight loss efforts. We will continue to follow you every 6 months regarding fatty liver. Please call in the interim with any questions or concerns.

## 2023-08-07 ENCOUNTER — Other Ambulatory Visit: Payer: Self-pay | Admitting: Gastroenterology

## 2023-08-26 ENCOUNTER — Encounter (INDEPENDENT_AMBULATORY_CARE_PROVIDER_SITE_OTHER): Payer: Medicaid Other | Admitting: Adult Health

## 2023-09-02 ENCOUNTER — Other Ambulatory Visit (HOSPITAL_COMMUNITY): Payer: Self-pay

## 2023-09-02 ENCOUNTER — Encounter: Payer: Self-pay | Admitting: "Endocrinology

## 2023-09-02 ENCOUNTER — Telehealth: Payer: Self-pay

## 2023-09-02 ENCOUNTER — Ambulatory Visit (INDEPENDENT_AMBULATORY_CARE_PROVIDER_SITE_OTHER): Payer: Medicaid Other | Admitting: "Endocrinology

## 2023-09-02 VITALS — BP 140/90 | HR 100 | Ht 64.0 in | Wt 271.0 lb

## 2023-09-02 DIAGNOSIS — Z7984 Long term (current) use of oral hypoglycemic drugs: Secondary | ICD-10-CM | POA: Diagnosis not present

## 2023-09-02 DIAGNOSIS — E1165 Type 2 diabetes mellitus with hyperglycemia: Secondary | ICD-10-CM | POA: Diagnosis not present

## 2023-09-02 DIAGNOSIS — E782 Mixed hyperlipidemia: Secondary | ICD-10-CM

## 2023-09-02 LAB — POCT GLYCOSYLATED HEMOGLOBIN (HGB A1C): Hemoglobin A1C: 7.6 % — AB (ref 4.0–5.6)

## 2023-09-02 MED ORDER — TIRZEPATIDE 2.5 MG/0.5ML ~~LOC~~ SOAJ
2.5000 mg | SUBCUTANEOUS | 0 refills | Status: AC
Start: 2023-09-02 — End: 2023-10-02

## 2023-09-02 MED ORDER — SYNJARDY XR 5-1000 MG PO TB24: 2.0000 | ORAL_TABLET | Freq: Every day | ORAL | 1 refills | Status: DC

## 2023-09-02 MED ORDER — DEXCOM G7 SENSOR MISC
1.0000 | 0 refills | Status: DC
Start: 2023-09-02 — End: 2024-03-03

## 2023-09-02 MED ORDER — SYNJARDY 5-1000 MG PO TABS
1.0000 | ORAL_TABLET | Freq: Two times a day (BID) | ORAL | 0 refills | Status: DC
Start: 2023-09-02 — End: 2023-09-02

## 2023-09-02 NOTE — Telephone Encounter (Signed)
Pharmacy Patient Advocate Encounter   Received notification from CoverMyMeds that prior authorization for [facility] is required/requested.   Insurance verification completed.   The patient is insured through [facility] .   Per test claim: PA required; PA submitted to above mentioned insurance via CoverMyMeds Key/confirmation #/EOC [facility] Status is pending

## 2023-09-02 NOTE — Patient Instructions (Signed)

## 2023-09-02 NOTE — Telephone Encounter (Signed)
Pharmacy Patient Advocate Encounter   Received notification from CoverMyMeds that prior authorization for Dexcom G7 sensor is required/requested.   Insurance verification completed.   The patient is insured through [facility] .   Per test claim: PA required; PA submitted to above mentioned insurance via CoverMyMeds Key/confirmation #/EOC [id number] Status is pending

## 2023-09-02 NOTE — Progress Notes (Signed)
Outpatient Endocrinology Note Heather Le [place], MD  09/02/23   Heather Fry [facility] [birth year] [id number]  Referring Provider: Elfredia Nevins, MD Primary Care Provider: Elfredia Nevins, MD Reason for consultation: Subjective   Assessment & Plan  Diagnoses and all orders for this visit:  Uncontrolled type 2 diabetes mellitus with hyperglycemia (HCC) -     POCT glycosylated hemoglobin (Hb A1C) -     Continuous Glucose Sensor (DEXCOM G7 SENSOR) MISC; 1 Device by Does not apply route continuous. -     tirzepatide [facility]) 2.5 MG/0.5ML Pen; Inject 2.5 mg into the skin once a week. -     Discontinue: Empagliflozin-metFORMIN HCl (SYNJARDY) 01-999 MG TABS; Take 1 tablet by mouth 2 (two) times daily. -     Microalbumin / creatinine urine ratio -     Lipid panel  Long term (current) use of oral hypoglycemic drugs  Mixed hypercholesterolemia and hypertriglyceridemia  Other orders -     Empagliflozin-metFORMIN HCl ER (SYNJARDY XR) 01-999 MG TB24; Take 2 tablets by mouth daily.    Diabetes Type II complicated by hyperglycemia ,  Lab Results  Component Value Date   GFR 90.22 10/04/2021   Hba1c goal less than 7, current Hba1c is  Lab Results  Component Value Date   HGBA1C 7.6 (A) 09/02/2023   Will recommend the following: Replace glimepiride 1 mg tid with synjardy XR 01/999 mg once a day (increase to 2 tabs a day if well tolerated after 1 week) Continue acarbose 100 mg 3 times a day (just before each meal) Continue Lantus 20 units if BG>250 Ordered mounajro pr aptient request, patient counseled extensively on S/E including pancreatitis. Patient wants to try, will try after few days on synjardy not to confuse S/E   No known contraindications/side effects to any of above medications  -Last LD and Tg are as follows: Lab Results  Component Value Date   LDLCALC 106 (H) 01/20/2020    Lab Results  Component Value Date   TRIG 279 (H) 04/29/2023   -On rosuvastatin 10 mg every  day + fish oil -Follow low fat diet and exercise   -Blood pressure goal <140/90 - Microalbumin/creatinine goal is < 30 -Last MA/Cr is as follows: No results found for: "MICROALBUR", "MALB24HUR" -not on ACE/ARB  -diet changes including salt restriction -limit eating outside -counseled BP targets per standards of diabetes care -uncontrolled blood pressure can lead to retinopathy, nephropathy and cardiovascular and atherosclerotic heart disease  Reviewed and counseled on: -A1C target -Blood sugar targets -Complications of uncontrolled diabetes  -Checking blood sugar before meals and bedtime and bring log next visit -All medications with mechanism of action and side effects -Hypoglycemia management: rule of 15's, Glucagon Emergency Kit and medical alert ID -low-carb low-fat plate-method diet -At least 20 minutes of physical activity per day -Annual dilated retinal eye exam and foot exam -compliance and follow up needs -follow up as scheduled or earlier if problem gets worse  Call if blood sugar is less than 70 or consistently above 250    Take a 15 gm snack of carbohydrate at bedtime before you go to sleep if your blood sugar is less than 100.    If you are going to fast after midnight for a test or procedure, ask your physician for instructions on how to reduce/decrease your insulin dose.    Call if blood sugar is less than 70 or consistently above 250  -Treating a low sugar by rule of 15  (15 gms of sugar every  15 min until sugar is more than 70) If you feel your sugar is low, test your sugar to be sure If your sugar is low (less than 70), then take 15 grams of a fast acting Carbohydrate (3-4 glucose tablets or glucose gel or 4 ounces of juice or regular soda) Recheck your sugar 15 min after treating low to make sure it is more than 70 If sugar is still less than 70, treat again with 15 grams of carbohydrate          Don't drive the hour of hypoglycemia  If unconscious/unable to  eat or drink by mouth, use glucagon injection or nasal spray baqsimi and call 911. Can repeat again in 15 min if still unconscious.  Return in about 4 weeks (around 09/30/2023).   I have reviewed current medications, nurse's notes, allergies, vital signs, past medical and surgical history, family medical history, and social history for this encounter. Counseled patient on symptoms, examination findings, lab findings, imaging results, treatment decisions and monitoring and prognosis. The patient understood the recommendations and agrees with the treatment plan. All questions regarding treatment plan were fully answered.  Heather [place], MD  09/02/23    History of Present Illness Heather Fry is a 47 y.o. year old female who presents for evaluation of Type II diabetes mellitus.  Heather Fry was first diagnosed in 2022.   Diabetes education +  Home diabetes regimen: acarbose 100 mg 3 times a day (just before each meal) Glimepiride 1 mg 3 times a day Lantus 20 units if BG>250  She cannot take pioglitazone (edema).   Had a pancreatitis but wants to   COMPLICATIONS -  MI/Stroke -  retinopathy -  neuropathy (has carpal tunnel related B/L hand numbness+tingling) -  nephropathy  BLOOD SUGAR DATA  CGM interpretation: At today's visit, we reviewed her CGM downloads. The full report is scanned in the media. Reviewing the CGM trends, BG are mildly high across the day.   Physical Exam  BP (!) 140/90   Pulse 100   Ht 5\' 4"  (1.626 m)   Wt 271 lb (122.9 kg)   LMP 10/09/2014   SpO2 96%   BMI 46.52 kg/m    Constitutional: well developed, well nourished Head: normocephalic, atraumatic Eyes: sclera anicteric, no redness Neck: supple Lungs: normal respiratory effort Neurology: alert and oriented Skin: dry, no appreciable rashes Musculoskeletal: no appreciable defects Psychiatric: normal mood and affect Diabetic Foot Exam - Simple   No data filed      Current  Medications Patient's Medications  New Prescriptions   CONTINUOUS GLUCOSE SENSOR (DEXCOM G7 SENSOR) MISC    1 Device by Does not apply route continuous.   EMPAGLIFLOZIN-METFORMIN HCL ER (SYNJARDY XR) 01-999 MG TB24    Take 2 tablets by mouth daily.   TIRZEPATIDE (MOUNJARO) 2.5 MG/0.5ML PEN    Inject 2.5 mg into the skin once a week.  Previous Medications   ACARBOSE (PRECOSE) 100 MG TABLET    Take 100 mg by mouth daily as needed (blood glucose above 160).   ALBUTEROL (VENTOLIN HFA) 108 (90 BASE) MCG/ACT INHALER    Inhale 2 puffs into the lungs every 6 (six) hours as needed.   ASCORBIC ACID (VITAMIN C) 1000 MG TABLET    Take 2,500 mg by mouth daily.   AZELASTINE HCL 0.15 % SOLN    Place 2 sprays into both nostrils 2 (two) times daily.   CETIRIZINE (ZYRTEC) 10 MG TABLET    Take 10 mg by  mouth daily.   CHOLECALCIFEROL (VITAMIN D3) 125 MCG (5000 UT) CAPS    Take 1 capsule (5,000 Units total) by mouth daily.   CICLESONIDE (ALVESCO) 80 MCG/ACT INHALER    Inhale 2 puffs twice a day with spacer. Rinse mouth out after   CONTINUOUS BLOOD GLUC SENSOR (DEXCOM G7 SENSOR) MISC    USE AS DIRECTED EVERY 10 DAYS   EPINEPHRINE 0.3 MG/0.3 ML IJ SOAJ INJECTION    Inject 0.3 mg into the muscle as needed for anaphylaxis.   EQUATE STOOL SOFTENER 100 MG CAPSULE    Take 100 mg by mouth 2 (two) times daily as needed for moderate constipation.   ESCITALOPRAM (LEXAPRO) 20 MG TABLET    Take 1 tablet (20 mg total) by mouth at bedtime.   FIBER PO    Take by mouth.   FLUTICASONE (FLONASE) 50 MCG/ACT NASAL SPRAY    Place 2 sprays into both nostrils at bedtime as needed for allergies.   GLIMEPIRIDE (AMARYL) 1 MG TABLET    Take 1 mg by mouth daily as needed (blood glucose above 200).   GLUCOSE BLOOD (ACCU-CHEK AVIVA PLUS) TEST STRIP    1 each by Other route daily. And lancets 1/day   INSULIN GLARGINE (LANTUS) 100 UNIT/ML INJECTION    Inject 20 Units into the skin daily.   INVESTIGATIONAL OMEGA-3-FATTY ACID/PLACEBO CAPSULE S0927     Take 3 capsules by mouth 2 (two) times daily. Take with food.   IPRATROPIUM (ATROVENT) 0.02 % NEBULIZER SOLUTION    INHALE 1 VIAL IN NEBULIZER EVERY 6 HOURS AS NEEDED   MAGNESIUM 250 MG TABS    Take 250 mg by mouth at bedtime.    MECLIZINE (ANTIVERT) 25 MG TABLET    Take 25 mg by mouth 4 (four) times daily as needed for dizziness.   NON FORMULARY    at bedtime. CPAP at night   PANTOPRAZOLE (PROTONIX) 40 MG TABLET    Take 1 tablet by mouth once to twice daily before a meal.   ROSUVASTATIN (CRESTOR) 10 MG TABLET    Take 10 mg by mouth every Saturday.   SIMETHICONE 250 MG CAPS    Take 500 mg by mouth at bedtime.   SUCRALFATE (CARAFATE) 1 GM/10ML SUSPENSION    TAKE 10 ML BY MOUTH  4 TIMES DAILY AS NEEDED   SYNTHROID 137 MCG TABLET    Take 137 mcg by mouth daily before breakfast.  Modified Medications   No medications on file  Discontinued Medications   No medications on file    Allergies Allergies  Allergen Reactions   Bee Venom Shortness Of Breath, Swelling and Palpitations   Ketorolac Tromethamine Anaphylaxis, Hives and Other (See Comments)   Hydrochlorothiazide Other (See Comments)    Dehydration  Dehydration, Dehydration   Levofloxacin Other (See Comments)    Pt can not have due to taking Lexapro   Levomenol Other (See Comments)   Other Other (See Comments)   Tramadol Hives   Triamcinolone Acetonide Other (See Comments)    Elevated blood pressure, caused diabetes, changed tyroid electrolytes Patient has no tyroid     Budesonide-Formoterol Fumarate Rash   Lisinopril Nausea Only and Other (See Comments)   Losartan Potassium Nausea Only   Nystatin Rash    oral   Penicillins Other (See Comments)    Loopy, childhood allergy  ALL CILLINS  Unknown childhood reactions Has patient had a PCN reaction causing anaphylaxis, immediate rash, facial/tongue/throat swelling, SOB or lightheadedness with hypotension? no, Has patient had a  PCN reaction causing severe rash involving mucus  membranes or skin necrosis?  no, Has patient had a PCN reaction that required hospitilization?no, , Has patient had a PCN reaction occurring within the last 10 years?  no, If all of the above answers are "no" then may proceed with cephalosporin use.   Wound Dressing Adhesive Rash    Past Medical History Past Medical History:  Diagnosis Date   Allergic asthma    Anemia    Anxiety    Arthritis    Depression    Diabetes mellitus without complication (HCC)    Diverticulosis    Fatty liver    moderate to severe   GERD (gastroesophageal reflux disease)    Graves disease    Headache    tension, ocular migraines   Hypothyroidism    Internal hemorrhoids    Prolonged Q-T interval on ECG    Sleep apnea    Thyroid disease    Uterine fibroid     Past Surgical History Past Surgical History:  Procedure Laterality Date   ABDOMINAL HYSTERECTOMY     Per patient, uterine fibroids.   ABDOMINAL HYSTERECTOMY     BIOPSY  05/06/2023   Procedure: BIOPSY;  Surgeon: Corbin Ade, MD;  Location: [facility];  Service: Endoscopy;;   CARPAL TUNNEL RELEASE Right 06/11/2021   Procedure: Right Carpel tunnel release;  Surgeon: Bedelia Person, MD;  Location: [facility] OR;  Service: Neurosurgery;  Laterality: Right;  RM 18   CHOLECYSTECTOMY N/A 11/06/2014   Procedure: LAPAROSCOPIC CHOLECYSTECTOMY;  Surgeon: Dalia Heading, MD;  Location: [facility];  Service: General;  Laterality: N/A;   COLONOSCOPY N/A 03/04/2017   Dr. Jena Gauss: Hemorrhoids, diverticulosis, benign polyp without adenomatous changes.  Next colonoscopy 10 years   COLONOSCOPY WITH PROPOFOL N/A 08/28/2022   Procedure: COLONOSCOPY WITH PROPOFOL;  Surgeon: Corbin Ade, MD;  Location: [facility];  Service: Endoscopy;  Laterality: N/A;  9:00 am   DILATION AND CURETTAGE OF UTERUS     ESOPHAGOGASTRODUODENOSCOPY N/A 10/02/2014   Dr. Jena Gauss: fundic gland polyps, hiatal hernia   ESOPHAGOGASTRODUODENOSCOPY  07/2020   [facility] ; normal  esophagus biopsied, benign-appearing gastric polyps biopsied, gastric biopsies obtained to evaluate for H. pylori, normal duodenum.  Pathology with mild chronic gastritis, negative for H. pylori, fundic gland polyps, esophageal biopsy benign.   ESOPHAGOGASTRODUODENOSCOPY (EGD) WITH PROPOFOL N/A 05/06/2023   Procedure: ESOPHAGOGASTRODUODENOSCOPY (EGD) WITH PROPOFOL;  Surgeon: Corbin Ade, MD;  Location: [facility];  Service: Endoscopy;  Laterality: N/A;  8:00 AM, ASA 3   MOUTH SURGERY     extraction of teeth   POLYPECTOMY  03/04/2017   Procedure: POLYPECTOMY;  Surgeon: Corbin Ade, MD;  Location: [facility];  Service: Endoscopy;;  colon   RIGHT OOPHORECTOMY  07/2021   THYROIDECTOMY N/A 09/30/2018   Procedure: TOTAL THYROIDECTOMY;  Surgeon: Darnell Level, MD;  Location: [facility] OR[facility];  Service: General;  Laterality: N/A;   TUBAL LIGATION      Family History family history includes Allergic rhinitis in her father and mother; Colon cancer (age of onset: 64) in her paternal aunt; Colon cancer (age of onset: 58) in her father; Colon polyps in her father; Depression in an other family member; Diabetes in her father; Hypertension in her father; Uterine cancer in her mother and sister.  Social History Social History   Socioeconomic History   Marital status: Married    Spouse name: Audri Noris   Number of children: Not on file  Years of education: 45   Highest education level: 12th grade  Occupational History   Not on file  Tobacco Use   Smoking status: Former    Current packs/day: 0.00    Average packs/day: 1 pack/day for 5.0 years (5.0 ttl pk-yrs)    Types: Cigarettes    Start date: 11/03/1992    Quit date: 11/03/1997    Years since quitting: 25.8    Passive exposure: Current   Smokeless tobacco: Never   Tobacco comments:    1 pack 1 week    Smoking Cessation Offered  Vaping Use   Vaping status: Never Used  Substance and Sexual Activity   Alcohol use: No    Alcohol/week: 0.0  standard drinks of alcohol   Drug use: No   Sexual activity: Yes    Partners: Male    Birth control/protection: Surgical  Other Topics Concern   Not on file  Social History Narrative   Not on file   Social Determinants of Health   Financial Resource Strain: Low Risk  (08/05/2022)   Overall Financial Resource Strain (CARDIA)    Difficulty of Paying Living Expenses: Not hard at all  Food Insecurity: No Food Insecurity (08/05/2022)   Hunger Vital Sign    Worried About Running Out of Food in the Last Year: Never true    Ran Out of Food in the Last Year: Never true  Transportation Needs: No Transportation Needs (08/05/2022)   PRAPARE - Administrator, Civil Service (Medical): No    Lack of Transportation (Non-Medical): No  Physical Activity: Inactive (08/05/2022)   Exercise Vital Sign    Days of Exercise per Week: 0 days    Minutes of Exercise per Session: 0 min  Stress: No Stress Concern Present (08/05/2022)   [employer] of Occupational Health - Occupational Stress Questionnaire    Feeling of Stress : Only a little  Social Connections: Socially Integrated (08/05/2022)   Social Connection and Isolation Panel [NHANES]    Frequency of Communication with Friends and Family: More than three times a week    Frequency of Social Gatherings with Friends and Family: More than three times a week    Attends Religious Services: More than 4 times per year    Active Member of Clubs or Organizations: Yes    Attends Banker Meetings: More than 4 times per year    Marital Status: Married  Catering manager Violence: Not At Risk (08/05/2022)   Humiliation, Afraid, Rape, and Kick questionnaire    Fear of Current or Ex-Partner: No    Emotionally Abused: No    Physically Abused: No    Sexually Abused: No    Lab Results  Component Value Date   HGBA1C 7.6 (A) 09/02/2023   HGBA1C 7.3 (H) 04/04/2022   HGBA1C 8.0 (H) 08/08/2021   Lab Results  Component Value Date   CHOL  172 04/29/2023   Lab Results  Component Value Date   HDL 55 01/20/2020   Lab Results  Component Value Date   LDLCALC 106 (H) 01/20/2020   Lab Results  Component Value Date   TRIG 279 (H) 04/29/2023   Lab Results  Component Value Date   CHOLHDL 4.0 01/20/2020   Lab Results  Component Value Date   CREATININE 0.63 04/30/2023   Lab Results  Component Value Date   GFR 90.22 10/04/2021   No results found for: "MICROALBUR", "MALB24HUR"    Component Value Date/Time   NA 138 04/30/2023 1424  NA 138 04/29/2023 1220   K 3.9 04/30/2023 1424   CL 103 04/30/2023 1424   CO2 25 04/30/2023 1424   GLUCOSE 160 (H) 04/30/2023 1424   BUN 9 04/30/2023 1424   BUN 8 04/29/2023 1220   CREATININE 0.63 04/30/2023 1424   CREATININE 0.68 05/08/2020 1037   CALCIUM 9.6 04/30/2023 1424   PROT 7.4 04/30/2023 1424   PROT 6.9 04/29/2023 1220   ALBUMIN 3.9 04/30/2023 1424   ALBUMIN 4.4 04/29/2023 1220   AST 28 04/30/2023 1424   ALT 35 04/30/2023 1424   ALKPHOS 55 04/30/2023 1424   BILITOT 0.5 04/30/2023 1424   BILITOT <0.2 04/29/2023 1220   GFRNONAA >60 04/30/2023 1424   GFRAA >60 06/24/2020 1503      Latest Ref Rng & Units 04/30/2023    2:24 PM 04/29/2023   12:20 PM 12/21/2022    5:08 PM  BMP  Glucose 70 - 99 mg/dL 098  119  147   BUN 6 - 20 mg/dL 9  8  11    Creatinine 0.44 - 1.00 mg/dL 8.29  5.62  1.30   BUN/Creat Ratio 9 - 23  12    Sodium 135 - 145 mmol/L 138  138  135   Potassium 3.5 - 5.1 mmol/L 3.9  4.0  3.7   Chloride 98 - 111 mmol/L 103  102  101   CO2 22 - 32 mmol/L 25  21  24    Calcium 8.9 - 10.3 mg/dL 9.6  9.3  8.7        Component Value Date/Time   WBC 8.6 04/30/2023 1424   RBC 4.59 04/30/2023 1424   HGB 13.0 04/30/2023 1424   HGB 12.6 04/29/2023 1220   HCT 39.3 04/30/2023 1424   HCT 37.8 04/29/2023 1220   PLT 257 04/30/2023 1424   PLT 254 04/29/2023 1220   MCV 85.6 04/30/2023 1424   MCV 85 04/29/2023 1220   MCH 28.3 04/30/2023 1424   MCHC 33.1 04/30/2023 1424    RDW 14.9 04/30/2023 1424   RDW 14.9 04/29/2023 1220   LYMPHSABS 1.9 04/29/2023 1220   MONOABS 0.4 04/04/2022 1432   EOSABS 0.1 04/29/2023 1220   BASOSABS 0.0 04/29/2023 1220     Parts of this note may have been dictated using voice recognition software. There may be variances in spelling and vocabulary which are unintentional. Not all errors are proofread. Please notify the Thereasa Parkin if any discrepancies are noted or if the meaning of any statement is not clear.

## 2023-09-03 ENCOUNTER — Encounter: Payer: Self-pay | Admitting: "Endocrinology

## 2023-09-04 NOTE — Telephone Encounter (Signed)
Pharmacy Patient Advocate Encounter  Received notification from [facility] that Prior Authorization for Heather Fry has been DENIED.  See denial reason below. No denial letter attached in CMM. Will attach denial letter to Media tab once received.    PA #/Case ID/Reference #: [id number]

## 2023-09-04 NOTE — Telephone Encounter (Signed)
Heather Fry is aware.  

## 2023-09-10 ENCOUNTER — Other Ambulatory Visit: Payer: Self-pay

## 2023-09-11 ENCOUNTER — Telehealth: Payer: Self-pay

## 2023-09-11 NOTE — Telephone Encounter (Signed)
Pharmacy Patient Advocate Encounter   Received notification from Pt Calls Messages that prior authorization for Heather Fry is required/requested.   Insurance verification completed.   The patient is insured through [facility] .   Per test claim: PA required; PA submitted to above mentioned insurance via CoverMyMeds Key/confirmation #/EOC [id number] Status is pending

## 2023-09-11 NOTE — Telephone Encounter (Signed)
Pharmacy Patient Advocate Encounter  Received notification from [facility] that Prior Authorization for Dexcom G7 sensor has been APPROVED through 03/03/2024   PA #/Case ID/Reference #: [id number]

## 2023-09-16 ENCOUNTER — Other Ambulatory Visit: Payer: Self-pay | Admitting: Women's Health

## 2023-09-16 DIAGNOSIS — R921 Mammographic calcification found on diagnostic imaging of breast: Secondary | ICD-10-CM

## 2023-09-16 DIAGNOSIS — N6002 Solitary cyst of left breast: Secondary | ICD-10-CM

## 2023-09-16 LAB — HM MAMMOGRAPHY

## 2023-09-21 ENCOUNTER — Encounter (INDEPENDENT_AMBULATORY_CARE_PROVIDER_SITE_OTHER): Payer: Medicaid Other | Admitting: Adult Health

## 2023-09-28 ENCOUNTER — Other Ambulatory Visit: Payer: Medicaid Other

## 2023-09-29 ENCOUNTER — Other Ambulatory Visit (HOSPITAL_COMMUNITY): Payer: Self-pay

## 2023-09-29 NOTE — Telephone Encounter (Signed)
Pharmacy Patient Advocate Encounter  Received notification from [facility] [employer] Prior Authorization for Synjardy 5-1000MG  tablets has been APPROVED from 09-11-2023 to 09-10-2024   PA #/Case ID/Reference #: Heather Fry

## 2023-10-02 ENCOUNTER — Other Ambulatory Visit: Payer: Medicaid Other

## 2023-10-04 NOTE — Progress Notes (Deleted)
  Cardiology Office Note:   Date:  10/04/2023  ID:  Heather Fry, DOB 1976-05-22, MRN [id number] PCP: Bertell Satterfield, MD  [facility] Providers Cardiologist:  None {  History of Present Illness:   Heather Fry is a 48 y.o. female who presents for evaluation of chest pain.  She was in the ED in Nov  2022 for this.  She had a  Lexiscan  Myoview  on 10/13/2019, which showed normal perfusion, LVEF 63%.  TTE on 10/19/2019 showed normal LVEF, normal RV function, no significant valvular disease.  She saw Dr. Kate at that time for evaluation of chest pain.     She was in the ED earlier this month for evaluation of chest pain.   I reviewed these records for this visit.   She said her wearable suggested atrial fib but the ED provider reviewed her phone and did not see this.  There was no objective evidence of ischemia and her pain was thought to be non anginal.     She is having problems with her asthma right now and her blood pressure is up because of that.  The patient denies any new symptoms such as chest discomfort, neck or arm discomfort. There has been no new  PND or orthopnea. There have been no reported palpitations, presyncope or syncope.   ROS: ***  Studies Reviewed:    EKG:       ***  Risk Assessment/Calculations:   {Does this patient have ATRIAL FIBRILLATION?:[phone]} No BP recorded.  {Refresh Note OR Click here to enter BP  :1}***        Physical Exam:   VS:  LMP 10/09/2014    Wt Readings from Last 3 Encounters:  09/02/23 271 lb (122.9 kg)  08/04/23 273 lb 9.6 oz (124.1 kg)  07/10/23 270 lb (122.5 kg)     GEN: Well nourished, well developed in no acute distress NECK: No JVD; No carotid bruits CARDIAC: ***RR, *** murmurs, rubs, gallops RESPIRATORY:  Clear to auscultation without rales, wheezing or rhonchi  ABDOMEN: Soft, non-tender, non-distended EXTREMITIES:  No edema; No deformity   ASSESSMENT AND PLAN:   HTN:   Blood pressure is ***  elevated today  but she shows me her home readings with reliable monitor and her blood pressures are in the 130s over 80s.  No change in therapy.  She does not want to take Cozaar  which was suggested for its renal protective effects.  Think it is reasonable for her to stay off further polypharmacy effects of choice.    Chest pain:  ***  She has had nonanginal chest discomfort.  No change in therapy.    Sleep apnea:    She uses CPAP.  ***   No change in therapy.   QT prolonged:   ***  The patient does have some prolonged QT but no symptoms related to this.       Follow up ***  Signed, Lynwood Schilling, MD

## 2023-10-05 ENCOUNTER — Other Ambulatory Visit: Payer: Medicaid Other

## 2023-10-05 ENCOUNTER — Ambulatory Visit: Payer: Medicaid Other | Admitting: "Endocrinology

## 2023-10-07 ENCOUNTER — Ambulatory Visit: Payer: Medicaid Other | Admitting: Cardiology

## 2023-10-07 DIAGNOSIS — R072 Precordial pain: Secondary | ICD-10-CM

## 2023-10-07 DIAGNOSIS — R9431 Abnormal electrocardiogram [ECG] [EKG]: Secondary | ICD-10-CM

## 2023-10-09 ENCOUNTER — Other Ambulatory Visit: Payer: Medicaid Other

## 2023-10-14 ENCOUNTER — Other Ambulatory Visit: Payer: Medicaid Other

## 2023-10-16 ENCOUNTER — Other Ambulatory Visit: Payer: Medicaid Other

## 2023-10-23 ENCOUNTER — Ambulatory Visit
Admission: RE | Admit: 2023-10-23 | Discharge: 2023-10-23 | Disposition: A | Discharge status: Home / Self Care | Payer: Medicaid Other | Source: Ambulatory Visit | Attending: Women's Health | Admitting: Women's Health

## 2023-10-23 ENCOUNTER — Other Ambulatory Visit: Payer: Medicaid Other

## 2023-10-23 DIAGNOSIS — N6002 Solitary cyst of left breast: Secondary | ICD-10-CM

## 2023-10-23 DIAGNOSIS — R921 Mammographic calcification found on diagnostic imaging of breast: Secondary | ICD-10-CM

## 2023-10-23 HISTORY — PX: BREAST BIOPSY: SHX20

## 2023-10-26 LAB — SURGICAL PATHOLOGY

## 2023-11-04 ENCOUNTER — Ambulatory Visit: Payer: Medicaid Other | Admitting: Allergy & Immunology

## 2024-01-07 ENCOUNTER — Emergency Department (HOSPITAL_COMMUNITY)
Admission: EM | Admit: 2024-01-07 | Discharge: 2024-01-07 | Disposition: A | Discharge status: Home / Self Care | Attending: Emergency Medicine | Admitting: Emergency Medicine

## 2024-01-07 ENCOUNTER — Other Ambulatory Visit: Payer: Self-pay

## 2024-01-07 ENCOUNTER — Encounter (HOSPITAL_COMMUNITY): Payer: Self-pay | Admitting: Emergency Medicine

## 2024-01-07 DIAGNOSIS — Z794 Long term (current) use of insulin: Secondary | ICD-10-CM | POA: Insufficient documentation

## 2024-01-07 DIAGNOSIS — R1013 Epigastric pain: Secondary | ICD-10-CM | POA: Insufficient documentation

## 2024-01-07 DIAGNOSIS — R112 Nausea with vomiting, unspecified: Secondary | ICD-10-CM | POA: Insufficient documentation

## 2024-01-07 DIAGNOSIS — E871 Hypo-osmolality and hyponatremia: Secondary | ICD-10-CM | POA: Insufficient documentation

## 2024-01-07 DIAGNOSIS — E119 Type 2 diabetes mellitus without complications: Secondary | ICD-10-CM | POA: Insufficient documentation

## 2024-01-07 DIAGNOSIS — D72829 Elevated white blood cell count, unspecified: Secondary | ICD-10-CM | POA: Insufficient documentation

## 2024-01-07 LAB — COMPREHENSIVE METABOLIC PANEL WITH GFR
ALT: 25 U/L (ref 0–44)
AST: 18 U/L (ref 15–41)
Albumin: 3.6 g/dL (ref 3.5–5.0)
Alkaline Phosphatase: 61 U/L (ref 38–126)
Anion gap: 12 (ref 5–15)
BUN: 13 mg/dL (ref 6–20)
CO2: 21 mmol/L — ABNORMAL LOW (ref 22–32)
Calcium: 8.5 mg/dL — ABNORMAL LOW (ref 8.9–10.3)
Chloride: 100 mmol/L (ref 98–111)
Creatinine, Ser: 0.66 mg/dL (ref 0.44–1.00)
GFR, Estimated: >60 mL/min (ref >60)
Glucose, Bld: 173 mg/dL — ABNORMAL HIGH (ref 70–99)
Potassium: 3.6 mmol/L (ref 3.5–5.1)
Sodium: 133 mmol/L — ABNORMAL LOW (ref 135–145)
Total Bilirubin: 0.5 mg/dL (ref 0.0–1.2)
Total Protein: 7.4 g/dL (ref 6.5–8.1)

## 2024-01-07 LAB — URINALYSIS, ROUTINE W REFLEX MICROSCOPIC
Bilirubin Urine: NEGATIVE
Glucose, UA: NEGATIVE mg/dL
Hgb urine dipstick: NEGATIVE
Ketones, ur: NEGATIVE mg/dL
Leukocytes,Ua: NEGATIVE
Nitrite: NEGATIVE
Protein, ur: NEGATIVE mg/dL
Specific Gravity, Urine: 1.015 (ref 1.005–1.030)
pH: 7 (ref 5.0–8.0)

## 2024-01-07 LAB — CBC
HCT: 41.4 % (ref 36.0–46.0)
Hemoglobin: 13.6 g/dL (ref 12.0–15.0)
MCH: 28.3 pg (ref 26.0–34.0)
MCHC: 32.9 g/dL (ref 30.0–36.0)
MCV: 86.1 fL (ref 80.0–100.0)
Platelets: 240 10*3/uL (ref 150–400)
RBC: 4.81 MIL/uL (ref 3.87–5.11)
RDW: 15.1 % (ref 11.5–15.5)
WBC: 14.9 10*3/uL — ABNORMAL HIGH (ref 4.0–10.5)
nRBC: 0 % (ref 0.0–0.2)

## 2024-01-07 LAB — LIPASE, BLOOD: Lipase: 51 U/L (ref 11–51)

## 2024-01-07 MED ORDER — ONDANSETRON HCL 4 MG/2ML IJ SOLN
4.0000 mg | Freq: Once | INTRAMUSCULAR | Status: AC | PRN
  Administered 2024-01-07: 4 mg via INTRAVENOUS
  Filled 2024-01-07: qty 2

## 2024-01-07 MED ORDER — SODIUM CHLORIDE 0.9 % IV BOLUS
1000.0000 mL | Freq: Once | INTRAVENOUS | Status: AC
  Administered 2024-01-07: 1000 mL via INTRAVENOUS

## 2024-01-07 MED ORDER — ONDANSETRON 4 MG PO TBDP: 4.0000 mg | ORAL_TABLET | Freq: Three times a day (TID) | ORAL | 0 refills | Status: DC | PRN

## 2024-01-07 MED ORDER — ALUM & MAG HYDROXIDE-SIMETH 200-200-20 MG/5ML PO SUSP
30.0000 mL | Freq: Once | ORAL | Status: AC
  Administered 2024-01-07: 30 mL via ORAL
  Filled 2024-01-07: qty 30

## 2024-01-07 MED ORDER — FAMOTIDINE IN NACL 20-0.9 MG/50ML-% IV SOLN
20.0000 mg | Freq: Once | INTRAVENOUS | Status: AC
  Administered 2024-01-07: 20 mg via INTRAVENOUS
  Filled 2024-01-07: qty 50

## 2024-01-07 NOTE — ED Notes (Signed)
 Pt arrived with a electric wheelchair but states she is not paralyzed just unable to walk "due to bone on bone with my knees"

## 2024-01-07 NOTE — ED Provider Notes (Signed)
 [facility] AT [facility] Provider Note   CSN: [id number] Arrival date & time: 01/07/24  1204     History Chief Complaint  Patient presents with   Emesis   Abdominal Pain    Heather Fry is a 48 y.o. female patient with history of diabetes who presents to the emergency department today with epigastric sharp abdominal pain with associated nausea and vomiting.  Symptoms started last night.  She denies any anything out of the ordinary or sick contacts.  Patient took Bentyl with little relief.  She reports associated chills.   Emesis Associated symptoms: abdominal pain   Abdominal Pain Associated symptoms: vomiting        Home Medications Prior to Admission medications   Medication Sig Start Date End Date Taking? Authorizing Provider  ondansetron (ZOFRAN-ODT) 4 MG disintegrating tablet Take 1 tablet (4 mg total) by mouth every 8 (eight) hours as needed for nausea or vomiting. 01/07/24  Yes Meredeth Ide, Hudsyn Barich M, PA-C  acarbose (PRECOSE) 100 MG tablet Take 100 mg by mouth daily as needed (blood glucose above 160). 11/01/21   [provider]  albuterol (VENTOLIN HFA) 108 (90 Base) MCG/ACT inhaler Inhale 2 puffs into the lungs every 6 (six) hours as needed. 12/13/19   Steffanie Dunn, DO  Ascorbic Acid (VITAMIN C) 1000 MG tablet Take 2,500 mg by mouth daily.    [provider]  Azelastine HCl 0.15 % SOLN Place 2 sprays into both nostrils 2 (two) times daily. 11/07/22   Alfonse Spruce, MD  cetirizine (ZYRTEC) 10 MG tablet Take 10 mg by mouth daily.    [provider]  Cholecalciferol (VITAMIN D3) 125 MCG (5000 UT) CAPS Take 1 capsule (5,000 Units total) by mouth daily. Patient taking differently: Take 10,000 Units by mouth daily. 10/13/19   Romero Belling, MD  ciclesonide (ALVESCO) 80 MCG/ACT inhaler Inhale 2 puffs twice a day with spacer. Rinse mouth out after Patient not taking: Reported on 09/02/2023 07/08/23   Nehemiah Settle,  FNP  Continuous Blood Gluc Sensor (DEXCOM G7 SENSOR) MISC USE AS DIRECTED EVERY 10 DAYS 09/26/22   [provider]  Continuous Glucose Sensor (DEXCOM G7 SENSOR) MISC 1 Device by Does not apply route continuous. 09/02/23   Altamese Payson, MD  Empagliflozin-metFORMIN HCl ER (SYNJARDY XR) 01-999 MG TB24 Take 2 tablets by mouth daily. 09/02/23   Motwani, Carin Hock, MD  EPINEPHrine 0.3 mg/0.3 mL IJ SOAJ injection Inject 0.3 mg into the muscle as needed for anaphylaxis. 07/02/22   Alfonse Spruce, MD  EQUATE STOOL SOFTENER 100 MG capsule Take 100 mg by mouth 2 (two) times daily as needed for moderate constipation. 08/21/21   [provider]  escitalopram (LEXAPRO) 20 MG tablet Take 1 tablet (20 mg total) by mouth at bedtime. 01/26/19   Romero Belling, MD  FIBER PO Take by mouth.    [provider]  fluticasone (FLONASE) 50 MCG/ACT nasal spray Place 2 sprays into both nostrils at bedtime as needed for allergies.    [provider]  glimepiride (AMARYL) 1 MG tablet Take 1 mg by mouth daily as needed (blood glucose above 200). 01/24/22   [provider]  glucose blood (ACCU-CHEK AVIVA PLUS) test strip 1 each by Other route daily. And lancets 1/day 05/30/21   Romero Belling, MD  insulin glargine (LANTUS) 100 UNIT/ML injection Inject 20 Units into the skin daily.    [provider]  Investigational omega-3-fatty acid/placebo capsule [phone] Take 3 capsules by  mouth 2 (two) times daily. Take with food.    [provider]  ipratropium (ATROVENT) 0.02 % nebulizer solution INHALE 1 VIAL IN NEBULIZER EVERY 6 HOURS AS NEEDED 03/03/23   Alfonse Spruce, MD  Magnesium 250 MG TABS Take 250 mg by mouth at bedtime.     [provider]  meclizine (ANTIVERT) 25 MG tablet Take 25 mg by mouth 4 (four) times daily as needed for dizziness. 10/29/21   [provider]  NON FORMULARY at bedtime. CPAP at night    [provider]  pantoprazole  (PROTONIX) 40 MG tablet Take 1 tablet by mouth once to twice daily before a meal. 08/10/23   Tiffany Kocher, PA-C  rosuvastatin (CRESTOR) 10 MG tablet Take 10 mg by mouth every Saturday. 12/05/21   [provider]  Simethicone 250 MG CAPS Take 500 mg by mouth at bedtime.    [provider]  sucralfate (CARAFATE) 1 GM/10ML suspension TAKE 10 ML BY MOUTH  4 TIMES DAILY AS NEEDED 06/09/22   Letta Median, PA-C  SYNTHROID 137 MCG tablet Take 137 mcg by mouth daily before breakfast. 03/14/22   [provider]      Allergies    Bee venom, Ketorolac tromethamine, Hydrochlorothiazide, Levofloxacin, Levomenol, Other, Tramadol, Triamcinolone acetonide, Budesonide-formoterol fumarate, Lisinopril, Losartan potassium, Nystatin, Penicillins, and Wound dressing adhesive    Review of Systems   Review of Systems  Gastrointestinal:  Positive for abdominal pain and vomiting.  All other systems reviewed and are negative.   Physical Exam Updated Vital Signs BP 126/69   Pulse 92   Temp 98.9 F (37.2 C) (Oral)   Resp (!) 22   LMP 10/09/2014   SpO2 97%  Physical Exam Vitals and nursing note reviewed.  Constitutional:      General: She is not in acute distress.    Appearance: Normal appearance.  HENT:     Head: Normocephalic and atraumatic.  Eyes:     General:        Right eye: No discharge.        Left eye: No discharge.  Cardiovascular:     Comments: Regular rate and rhythm.  S1/S2 are distinct without any evidence of murmur, rubs, or gallops.  Radial pulses are 2+ bilaterally.  Dorsalis pedis pulses are 2+ bilaterally.  No evidence of pedal edema. Pulmonary:     Comments: Clear to auscultation bilaterally.  Normal effort.  No respiratory distress.  No evidence of wheezes, rales, or rhonchi heard throughout. Abdominal:     General: Abdomen is flat. Bowel sounds are normal. There is no distension.     Tenderness: There is abdominal tenderness in the epigastric area.  There is no guarding or rebound.  Musculoskeletal:        General: Normal range of motion.     Cervical back: Neck supple.  Skin:    General: Skin is warm and dry.     Findings: No rash.  Neurological:     General: No focal deficit present.     Mental Status: She is alert.  Psychiatric:        Mood and Affect: Mood normal.        Behavior: Behavior normal.     ED Results / Procedures / Treatments   Labs (all labs ordered are listed, but only abnormal results are displayed) Labs Reviewed  COMPREHENSIVE METABOLIC PANEL WITH GFR - Abnormal; Notable for the following components:      Result Value  Sodium 133 (*)    CO2 21 (*)    Glucose, Bld 173 (*)    Calcium 8.5 (*)    All other components within normal limits  CBC - Abnormal; Notable for the following components:   WBC 14.9 (*)    All other components within normal limits  LIPASE, BLOOD  URINALYSIS, ROUTINE W REFLEX MICROSCOPIC    EKG EKG Interpretation Date/Time:  Thursday January 07 2024 12:47:30 EDT Ventricular Rate:  106 PR Interval:  152 QRS Duration:  66 QT Interval:  316 QTC Calculation: 420 R Axis:   62  Text Interpretation: Sinus tachycardia Low voltage, precordial leads Borderline repolarization abnormality Baseline wander in lead(s) III aVF V3 V5 V6 No significant change since prior 11/24 Confirmed by Meridee Score [phone]) on 01/07/2024 12:50:48 PM  Radiology No results found.  Procedures Procedures    Medications Ordered in ED Medications  ondansetron (ZOFRAN) injection 4 mg (4 mg Intravenous Given 01/07/24 1254)  sodium chloride 0.9 % bolus 1,000 mL (0 mLs Intravenous Stopped 01/07/24 1541)  famotidine (PEPCID) IVPB 20 mg premix (0 mg Intravenous Stopped 01/07/24 1541)  alum & mag hydroxide-simeth (MAALOX/MYLANTA) 200-200-20 MG/5ML suspension 30 mL (30 mLs Oral Given 01/07/24 1351)    ED Course/ Medical Decision Making/ A&P   {   Click here for ABCD2, HEART and other calculators  Medical  Decision Making Heather Fry is a 48 y.o. female patient presents to the emergency department today for further evaluation of vomiting that started last night and into this morning.  She took some Bentyl with little relief.  Likely viral gastroenteritis.  Will plan to get some basic labs to evaluate for electrolyte status.  Over electrolyte status is normal apart from some mild hyponatremia.  She does have leukocytosis which is likely related to this viral gastroenteritis.  Patient feeling better after fluids, Pepcid, GI cocktail.  Will send her home with Zofran.  Tolerated p.o. well prior to discharge.  Strict turn precaution discussed.  She will follow-up with her primary care doctor.  She is safe for discharge.   Amount and/or Complexity of Data Reviewed Labs: ordered.  Risk OTC drugs. Prescription drug management.    Final Clinical Impression(s) / ED Diagnoses Final diagnoses:  Nausea and vomiting, unspecified vomiting type    Rx / DC Orders ED Discharge Orders          Ordered    ondansetron (ZOFRAN-ODT) 4 MG disintegrating tablet  Every 8 hours PRN        01/07/24 1802              Teressa Lower, PA-C 01/07/24 1921    Terrilee Files, MD 01/08/24 0700

## 2024-01-07 NOTE — ED Triage Notes (Signed)
 Pt c/o N/V  x last night and diarrhea that started this morning. Also c/o abd pain, mid, started last night as well--pt states pain is sharp/stabbing in nature and rates pain 8/10

## 2024-01-07 NOTE — ED Notes (Signed)
 Ginger Ale given.

## 2024-01-07 NOTE — Discharge Instructions (Addendum)
 As we discussed, everything is looking okay.  Your white blood cell count is slightly elevated today.  This could be from may be a viral illness is going on in your GI tract.  This should resolve with time.  I would drink plenty of fluids and get plenty of rest.  I would stick to a brat diet as well.  I given you Zofran which you can take as needed for nausea and vomiting.  Follow-up with your primary care doctor.  You may return to the emergency department for any worsening symptoms.

## 2024-01-19 NOTE — Progress Notes (Unsigned)
  Cardiology Office Note:   Date:  01/20/2024  ID:  MIAMI LATULIPPE, DOB May 17, 1976, MRN [id number] PCP: Kathyleen Parkins, MD  [facility] Providers Cardiologist:  None {  History of Present Illness:   Heather Fry is a 48 y.o. female who presents for evaluation of chest pain.  She was in the ED in Nov  2022 for this.  She had a  Lexiscan  Myoview  on 10/13/2019, which showed normal perfusion, LVEF 63%.  TTE on 10/19/2019 showed normal LVEF, normal RV function, no significant valvular disease.    Since I last saw her she has had no new cardiovascular complaints.  She is very limited by knee pain.  She is getting injections but she has to lose weight before she can get any knee surgeries.  She is not having any new chest pressure, neck or arm discomfort.  She is not having any new shortness of breath, PND or orthopnea.  She has chronic mild lower extremity swelling.  ROS: As stated in the HPI and negative for all other systems.  Studies Reviewed:    EKG:     Sinus rhythm, rate 106, axis within normal limits, intervals within normal limits, nonspecific ST-T wave flattening, 01/07/2024   Risk Assessment/Calculations:      Physical Exam:   VS:  BP (!) 163/98   Pulse 84   Ht 5\' 4"  (1.626 m)   Wt 269 lb (122 kg)   LMP 10/09/2014   BMI 46.17 kg/m    Wt Readings from Last 3 Encounters:  01/20/24 269 lb (122 kg)  09/02/23 271 lb (122.9 kg)  08/04/23 273 lb 9.6 oz (124.1 kg)     GEN: Well nourished, well developed in no acute distress NECK: No JVD; No carotid bruits CARDIAC: RRR, no murmurs, rubs, gallops RESPIRATORY:  Clear to auscultation without rales, wheezing or rhonchi  ABDOMEN: Soft, non-tender, non-distended EXTREMITIES:  No edema; No deformity   ASSESSMENT AND PLAN:   HTN:   Blood pressure is elevated but she says it is in the 130s over 80s routinely.  She says this is not usual for her because she is in pain.  She is also having some problems with asthma.  She will  keep an eye on her blood pressure at home but no change in therapy.    Chest pain: She is not having any current chest discomfort.  No change in therapy.   Sleep apnea:    She uses CPAP.     QT prolonged: This was not prolonged on the most recent EKG.  No change in therapy.   DM: She did not tolerate Synjardy .  She did not tolerate Ozempic.  I have suggested she follow-up with a new endocrinologist since her retirement.       Follow up with me as needed.   Signed, Eilleen Grates, MD

## 2024-01-20 ENCOUNTER — Encounter: Payer: Self-pay | Admitting: Cardiology

## 2024-01-20 ENCOUNTER — Ambulatory Visit (INDEPENDENT_AMBULATORY_CARE_PROVIDER_SITE_OTHER): Payer: Medicaid Other | Admitting: Cardiology

## 2024-01-20 VITALS — BP 163/98 | HR 84 | Ht 64.0 in | Wt 269.0 lb

## 2024-01-20 DIAGNOSIS — I10 Essential (primary) hypertension: Secondary | ICD-10-CM | POA: Diagnosis not present

## 2024-01-20 DIAGNOSIS — R9431 Abnormal electrocardiogram [ECG] [EKG]: Secondary | ICD-10-CM | POA: Diagnosis not present

## 2024-01-20 DIAGNOSIS — R072 Precordial pain: Secondary | ICD-10-CM | POA: Diagnosis not present

## 2024-01-20 NOTE — Patient Instructions (Signed)
 Medication Instructions:  The current medical regimen is effective;  continue present plan and medications.  *If you need a refill on your cardiac medications before your next appointment, please call your pharmacy*  Follow-Up: At [facility], you and your health needs are our priority.  As part of our continuing mission to provide you with exceptional heart care, our providers are all part of one team.  This team includes your primary Cardiologist (physician) and Advanced Practice Providers or APPs (Physician Assistants and Nurse Practitioners) who all work together to provide you with the care you need, when you need it.  Follow up as needed.   We recommend signing up for the patient portal called "MyChart".  Sign up information is provided on this After Visit Summary.  MyChart is used to connect with patients for Virtual Visits (Telemedicine).  Patients are able to view lab/test results, encounter notes, upcoming appointments, etc.  Non-urgent messages can be sent to your provider as well.   To learn more about what you can do with MyChart, go to [email or web address].

## 2024-02-03 ENCOUNTER — Encounter: Payer: Self-pay | Admitting: Gastroenterology

## 2024-02-03 ENCOUNTER — Ambulatory Visit (INDEPENDENT_AMBULATORY_CARE_PROVIDER_SITE_OTHER): Admitting: Gastroenterology

## 2024-02-03 VITALS — BP 140/90 | HR 90 | Temp 98.4°F | Ht 64.0 in | Wt 265.0 lb

## 2024-02-03 DIAGNOSIS — K219 Gastro-esophageal reflux disease without esophagitis: Secondary | ICD-10-CM

## 2024-02-03 DIAGNOSIS — R1013 Epigastric pain: Secondary | ICD-10-CM

## 2024-02-03 DIAGNOSIS — K760 Fatty (change of) liver, not elsewhere classified: Secondary | ICD-10-CM | POA: Diagnosis not present

## 2024-02-03 DIAGNOSIS — R112 Nausea with vomiting, unspecified: Secondary | ICD-10-CM

## 2024-02-03 NOTE — Progress Notes (Signed)
 GI Office Note    Referring Provider: Kathyleen Parkins, MD Primary Care Physician:  Kathyleen Parkins, MD  Primary Gastroenterologist: Rheba Cedar, MD   Chief Complaint   Chief Complaint  Patient presents with   Follow-up    Recently had pancreatitis    History of Present Illness   Heather Fry is a 48 y.o. female presenting today for follow-up.  Last seen in November 2024.  She has a history of mixed IBS, GERD, chronic right upper quadrant pain, fatty liver, elevated lipase.   States she was doing well from a GI standpoint until she recently had steroid shots in both knees. She developed abdominal pain, dizziness, fatigue. Went to ED at [facility]. States her BP was very high. Given fluids. Felt better. One week later, she developed epigastric pain with vomiting and symptoms of prior episodes of suspected pancreatitis.  Went to the ED at [facility].  Lipase was normal.  No imaging obtained.  She followed up with her PCP, lipase was 98, slightly elevated.  Symptoms lasted for about a week.  Labs from 01/29/2024: Amylase 32, lipase 53. Labs from January 12, 2024: Amylase 51, lipase 98 (reference range 14-72)  She is back to her baseline.  She has occasional gnawing type epigastric pain, typically takes the sucralfate  with relief.  No heartburn.  Bowel movements regular, [place] 4.  No melena or rectal bleeding.  States she has been tolerating Crestor  3 times weekly.  Her last lipid panel was over a year ago.  Plans to go for updated lipid panel and potentially increase her Crestor  to 4 times weekly.  She is very sensitive to medications  She has been working on weight loss.  She needs to have a knee replacement surgery.  She is down about 10 pounds in the past 1 year.  Her provider has recommended healthy weight and wellness.  She is able to ambulate a little in her house.  Predominantly uses a chair whenever she leaves her house.       Wt Readings from Last 10 Encounters:   02/03/24 265 lb (120.2 kg)  01/20/24 269 lb (122 kg)  09/02/23 271 lb (122.9 kg)  08/04/23 273 lb 9.6 oz (124.1 kg)  07/10/23 270 lb (122.5 kg)  07/08/23 270 lb 9.6 oz (122.7 kg)  05/06/23 272 lb 0.8 oz (123.4 kg)  04/30/23 272 lb (123.4 kg)  04/29/23 272 lb 6.4 oz (123.6 kg)  04/15/23 275 lb 6.4 oz (124.9 kg)    EGD 04/2023: -normal esophagus -gastric polyps, s/p biopsies, benign -esophageal biopsies with reflux changes -duodenal biopsies benign   Colonoscopy 07/2022: -diverticulosis -redundant colon -screening colonoscopy in 5 years due to positive FH of CRC.      Prior work up:   Colonoscopy in 2018 unremarkable.     EGD from November 2021 at [facility], gastritis, biopsies negative for H. pylori, fundic gland polyps.   CT pancreatic protocol completed August 2022 with no pancreatic abnormalities.  She had diffuse hepatic steatosis and a focal hyperdense nonenhancing nodularity along the posterior aspect of the right lobe of the liver measuring 11 mm that appears stable at least since 2019 and consistent with benign/indolent process.  MRI abdomen with and without contrast August 2022 revealed 1 cm subcapsular hemorrhagic cyst in the posterior right hepatic lobe, moderate to severe hepatic steatosis, incompletely visualized cystic lesion in the upper right pelvis measuring 8 cm with inability to exclude ovarian neoplasm.  MRI pelvis  with and without contrast completed September 2022 with large cystic lesion associated with the right ovary measuring up to 9.7 cm.  Differential including borderline tumor or large cyst with involution of adjacent smaller cyst.  GYN evaluation recommended.   Patient completed laparoscopic right oophorectomy November 2022.   In February 2023, sed rate 47, CRP 28.  Patient denies any autoimmune diagnosis.  In May 2022 sed rate 39, CRP 25.5.  RA screen negative, anti-CCP less than 5, complement C4 33, normal.  Complement C3 177  normal.  Sjogren's antibody less than 0.2 normal, ANA negative, adolase S 5.3, CK 68 normal. Wbc 8800, hemoglobin 12.9, platelets 281,000, glucose 155, BUN 10, creatinine 0.79, albumin 4.6, total bilirubin less than 0.2, alk phos 73, AST 22, ALT 28.   She has had elevated lipase in the 200 range intermittently dating back as far as 2019.  Highest lipase of 257 in July 2022.  LFTs have been normal aside from slight elevation of AST at 41 in June 2022. History of cholecystectomy.  States that she was given steroids once and it caused pancreatitis.       Medications   Current Outpatient Medications  Medication Sig Dispense Refill   acarbose  (PRECOSE ) 100 MG tablet Take 100 mg by mouth daily as needed (blood glucose above 160).     albuterol  (VENTOLIN  HFA) 108 (90 Base) MCG/ACT inhaler Inhale 2 puffs into the lungs every 6 (six) hours as needed. 18 g 11   Ascorbic Acid (VITAMIN C) 1000 MG tablet Take 2,500 mg by mouth daily.     cetirizine  (ZYRTEC ) 10 MG tablet Take 10 mg by mouth daily.     Cholecalciferol  (VITAMIN D3) 125 MCG (5000 UT) CAPS Take 1 capsule (5,000 Units total) by mouth daily. (Patient taking differently: Take 10,000 Units by mouth daily.) 30 capsule 11   Continuous Blood Gluc Sensor (DEXCOM G7 SENSOR) MISC USE AS DIRECTED EVERY 10 DAYS     Continuous Glucose Sensor (DEXCOM G7 SENSOR) MISC 1 Device by Does not apply route continuous. 9 each 0   EPINEPHrine  0.3 mg/0.3 mL IJ SOAJ injection Inject 0.3 mg into the muscle as needed for anaphylaxis. 2 each 1   EQUATE STOOL SOFTENER 100 MG capsule Take 100 mg by mouth 2 (two) times daily as needed for moderate constipation.     escitalopram  (LEXAPRO ) 20 MG tablet Take 1 tablet (20 mg total) by mouth at bedtime. 30 tablet 5   fluticasone  (FLONASE ) 50 MCG/ACT nasal spray Place 2 sprays into both nostrils at bedtime as needed for allergies.     glimepiride (AMARYL) 1 MG tablet Take 1 mg by mouth daily as needed (blood glucose above 200).      glucose blood (ACCU-CHEK AVIVA PLUS) test strip 1 each by Other route daily. And lancets 1/day 100 each 3   Investigational omega-3-fatty acid/placebo capsule S0927 Take 3 capsules by mouth 2 (two) times daily. Take with food.     ipratropium (ATROVENT ) 0.02 % nebulizer solution INHALE 1 VIAL IN NEBULIZER EVERY 6 HOURS AS NEEDED 180 mL 0   LANTUS SOLOSTAR 100 UNIT/ML Solostar Pen Inject 20 Units into the skin daily.     Magnesium  250 MG TABS Take 250 mg by mouth at bedtime.      NON FORMULARY at bedtime. CPAP at night     omeprazole  (PRILOSEC) 40 MG capsule Take 40 mg by mouth 2 (two) times daily.     ondansetron  (ZOFRAN -ODT) 4 MG disintegrating tablet Take 1 tablet (4  mg total) by mouth every 8 (eight) hours as needed for nausea or vomiting. 20 tablet 0   rosuvastatin  (CRESTOR ) 10 MG tablet Take 10 mg by mouth every Saturday.     Simethicone  250 MG CAPS Take 500 mg by mouth at bedtime.     sucralfate  (CARAFATE ) 1 GM/10ML suspension TAKE 10 ML BY MOUTH  4 TIMES DAILY AS NEEDED 828 mL 0   SYNTHROID  137 MCG tablet Take 137 mcg by mouth daily before breakfast.     No current facility-administered medications for this visit.    Allergies   Allergies as of 02/03/2024 - Review Complete 02/03/2024  Allergen Reaction Noted   Bee venom Shortness Of Breath, Swelling, and Palpitations 03/31/2023   Ketorolac  tromethamine  Anaphylaxis, Hives, and Other (See Comments) 06/08/2019   Hydrochlorothiazide Other (See Comments) 06/22/2020   Levofloxacin Other (See Comments) 03/03/2017   Levomenol Other (See Comments) 03/06/2020   Other Other (See Comments) 12/17/2015   Tramadol  Hives 01/11/2021   Triamcinolone acetonide Other (See Comments) 01/02/2021   Depo-medrol  [methylprednisolone ] Hypertension 01/20/2024   Menthol Other (See Comments) 12/26/2023   Budesonide -formoterol  fumarate Rash 04/29/2023   Lisinopril Nausea Only and Other (See Comments) 01/18/2018   Losartan  potassium Nausea Only 06/22/2020    Nystatin Rash 04/04/2022   Penicillins Other (See Comments) 04/06/2012   Wound dressing adhesive Rash 04/29/2023      Review of Systems   General: Negative for anorexia, unintentional weight loss, fever, chills, fatigue, weakness. ENT: Negative for hoarseness, difficulty swallowing , nasal congestion. CV: Negative for chest pain, angina, palpitations, dyspnea on exertion, peripheral edema.  Respiratory: Negative for dyspnea at rest, dyspnea on exertion, cough, sputum, wheezing.  GI: See history of present illness. GU:  Negative for dysuria, hematuria, urinary incontinence, urinary frequency, nocturnal urination.  Endo: Negative for unusual weight change.     Physical Exam   BP (!) 140/90 (BP Location: Left Arm, Patient Position: Sitting, Cuff Size: Large)   Pulse 90   Temp 98.4 F (36.9 C) (Oral)   Ht 5\' 4"  (1.626 m)   Wt 265 lb (120.2 kg) Comment: per pt  LMP 10/09/2014   SpO2 97%   BMI 45.49 kg/m    General: Well-nourished, well-developed in no acute distress.  Eyes: No icterus. Mouth: Oropharyngeal mucosa moist and pink   Lungs: Clear to auscultation bilaterally.  Heart: Regular rate and rhythm, no murmurs rubs or gallops.  Abdomen: Bowel sounds are normal, nontender, nondistended, no hepatosplenomegaly or masses,  no abdominal bruits or hernia , no rebound or guarding. Limited by body habitus Rectal: not performed  Extremities: No lower extremity edema. No clubbing or deformities. Neuro: Alert and oriented x 4   Skin: Warm and dry, no jaundice.   Psych: Alert and cooperative, normal mood and affect.  Labs   Lab Results  Component Value Date   NA 133 (L) 01/07/2024   CL 100 01/07/2024   K 3.6 01/07/2024   CO2 21 (L) 01/07/2024   BUN 13 01/07/2024   CREATININE 0.66 01/07/2024   GFRNONAA >60 01/07/2024   CALCIUM  8.5 (L) 01/07/2024   PHOS 3.8 11/07/2022   ALBUMIN 3.6 01/07/2024   GLUCOSE 173 (H) 01/07/2024   Lab Results  Component Value Date   ALT 25  01/07/2024   AST 18 01/07/2024   ALKPHOS 61 01/07/2024   BILITOT 0.5 01/07/2024   Lab Results  Component Value Date   WBC 14.9 (H) 01/07/2024   HGB 13.6 01/07/2024   HCT 41.4 01/07/2024  MCV 86.1 01/07/2024   PLT 240 01/07/2024   Lab Results  Component Value Date   LIPASE 51 01/07/2024    Imaging Studies   No results found.  Assessment/Plan:   GERD: doing well - Reinforced antireflux measures. -Continue omeprazole  40 mg once to twice daily before meals  Epigastric pain/nausea/vomiting: Recent episode with mild bump in lipase as outlined.  Has had several episodes of similar symptoms with mild bump in lipase typically no higher than the 200 range.  She had a CT pancreatic protocol back in 2022 with unremarkable pancreas. -Recurrent episode of abdominal pain, vomiting, would consider updating CT scan, possibly pancreatic protocol at that time.  Fatty liver: -FIB4 0.7, excludes advanced fibrosis -Reinforced tight glycemic control, management of cholesterol, weight reduction. -Recommend updating lipid panel with your PCP.  If needed increase rosuvastatin , currently taking 3 times weekly. -Consider healthy weight and wellness, discussed process with patient at length. -Update abdominal ultrasound with elastography to get a baseline     Trudie Fuse. Harles Lied, MHS, PA-C [facility]

## 2024-02-03 NOTE — Patient Instructions (Addendum)
 Continue omeprazole  40mg  once to twice daily. Continue your weight loss efforts. [facility] to [facility] I see Dr. Ladd Picker.  Ultrasound with elastography to evaluate your fatty liver. We will be in touch with results. If you have recurrent abdominal pain and/or vomiting, let me know and we would consider updating CT of your pancreas.

## 2024-02-05 ENCOUNTER — Ambulatory Visit: Payer: Medicaid Other | Admitting: Gastroenterology

## 2024-02-10 ENCOUNTER — Ambulatory Visit (HOSPITAL_COMMUNITY)
Admission: RE | Admit: 2024-02-10 | Discharge: 2024-02-10 | Disposition: A | Discharge status: Home / Self Care | Source: Ambulatory Visit | Attending: Gastroenterology | Admitting: Gastroenterology

## 2024-02-10 ENCOUNTER — Ambulatory Visit: Payer: Self-pay | Admitting: Gastroenterology

## 2024-02-10 DIAGNOSIS — K219 Gastro-esophageal reflux disease without esophagitis: Secondary | ICD-10-CM | POA: Diagnosis present

## 2024-02-10 DIAGNOSIS — K760 Fatty (change of) liver, not elsewhere classified: Secondary | ICD-10-CM | POA: Insufficient documentation

## 2024-02-11 ENCOUNTER — Encounter: Payer: Self-pay | Admitting: Bariatrics

## 2024-02-11 ENCOUNTER — Ambulatory Visit: Admitting: Bariatrics

## 2024-02-11 VITALS — BP 146/96 | HR 87 | Temp 97.5°F | Ht 63.0 in | Wt 265.0 lb

## 2024-02-11 DIAGNOSIS — Z7984 Long term (current) use of oral hypoglycemic drugs: Secondary | ICD-10-CM

## 2024-02-11 DIAGNOSIS — E66813 Obesity, class 3: Secondary | ICD-10-CM

## 2024-02-11 DIAGNOSIS — E119 Type 2 diabetes mellitus without complications: Secondary | ICD-10-CM | POA: Diagnosis not present

## 2024-02-11 DIAGNOSIS — E1165 Type 2 diabetes mellitus with hyperglycemia: Secondary | ICD-10-CM

## 2024-02-11 DIAGNOSIS — Z794 Long term (current) use of insulin: Secondary | ICD-10-CM | POA: Diagnosis not present

## 2024-02-11 DIAGNOSIS — Z6841 Body Mass Index (BMI) 40.0 and over, adult: Secondary | ICD-10-CM | POA: Diagnosis not present

## 2024-02-11 DIAGNOSIS — E65 Localized adiposity: Secondary | ICD-10-CM

## 2024-02-11 NOTE — Progress Notes (Signed)
 Office: [phone]  /  Fax: [phone]   Initial Visit  Heather Fry was seen in clinic today to evaluate for obesity. She is interested in losing weight to improve overall health and reduce the risk of weight related complications. She presents today to review program treatment options, initial physical assessment, and evaluation.     She was referred by: Self-Referral  When asked what else they would like to accomplish? She states: Adopt healthier eating patterns, Improve energy levels and physical activity, Improve existing medical conditions, Improve quality of life, and needs to have surgery on her knees.   When asked how has your weight affected you? She states: Contributed to medical problems, Contributed to orthopedic problems or mobility issues, Having fatigue, and Having poor endurance  Some associated conditions: Arthritis:both knees  and Other: hypothyroidism, perimenopause, Type 2 diabetes.   Contributing factors: Family history of obesity, Moderate to high levels of stress, and Reduced physical activity  Weight promoting medications identified: None  Current nutrition plan: High-protein, Portion control / smart choices, and Other: more vegetables.   Current level of physical activity: None  Current or previous pharmacotherapy: GLP-1 Ozempic - nausea  Response to medication: Had side effects so it was discontinued   Past medical history includes:   Past Medical History:  Diagnosis Date   Allergic asthma    Anemia    Anxiety    Arthritis    Depression    Diabetes mellitus without complication (HCC)    Diverticulosis    Fatty liver    moderate to severe   GERD (gastroesophageal reflux disease)    Graves disease    Headache    tension, ocular migraines   Hypothyroidism    Internal hemorrhoids    Prolonged Q-T interval on ECG    Sleep apnea    Thyroid  disease    Uterine fibroid      Objective:   BP (!) 146/96   Pulse 87   Temp (!) 97.5 F  (36.4 C)   Ht 5\' 3"  (1.6 m)   Wt 265 lb (120.2 kg)   LMP 10/09/2014   SpO2 100%   BMI 46.94 kg/m  She was weighed on the bioimpedance scale: Body mass index is 46.94 kg/m.  Peak Weight:273 lbs , Body Fat%:52.4 %, Visceral Fat Rating:18, Weight trend over the last 12 months: Decreasing  General:  Alert, oriented and cooperative. Patient is in no acute distress.  Respiratory: Normal respiratory effort, no problems with respiration noted  Extremities: Normal range of motion.    Mental Status: Normal mood and affect. Normal behavior. Normal judgment and thought content.   DIAGNOSTIC DATA REVIEWED:  BMET    Component Value Date/Time   NA 133 (L) 01/07/2024 1250   NA 138 04/29/2023 1220   K 3.6 01/07/2024 1250   CL 100 01/07/2024 1250   CO2 21 (L) 01/07/2024 1250   GLUCOSE 173 (H) 01/07/2024 1250   BUN 13 01/07/2024 1250   BUN 8 04/29/2023 1220   CREATININE 0.66 01/07/2024 1250   CREATININE 0.68 05/08/2020 1037   CALCIUM  8.5 (L) 01/07/2024 1250   GFRNONAA >60 01/07/2024 1250   GFRAA >60 06/24/2020 1503   Lab Results  Component Value Date   HGBA1C 7.6 (A) 09/02/2023   HGBA1C 5.4 01/20/2018   No results found for: "INSULIN " CBC    Component Value Date/Time   WBC 14.9 (H) 01/07/2024 1250   RBC 4.81 01/07/2024 1250   HGB 13.6 01/07/2024 1250   HGB 12.6  04/29/2023 1220   HCT 41.4 01/07/2024 1250   HCT 37.8 04/29/2023 1220   PLT 240 01/07/2024 1250   PLT 254 04/29/2023 1220   MCV 86.1 01/07/2024 1250   MCV 85 04/29/2023 1220   MCH 28.3 01/07/2024 1250   MCHC 32.9 01/07/2024 1250   RDW 15.1 01/07/2024 1250   RDW 14.9 04/29/2023 1220   Iron/TIBC/Ferritin/ %Sat    Component Value Date/Time   IRON 72 01/20/2020 0733   TIBC 358 01/20/2020 0733   FERRITIN 40 01/20/2020 0733   IRONPCTSAT 20 01/20/2020 0733   Lipid Panel     Component Value Date/Time   CHOL 172 04/29/2023 1220   TRIG 279 (H) 04/29/2023 1220   HDL 55 01/20/2020 0417   CHOLHDL 4.0 01/20/2020 0417    VLDL 61 (H) 01/20/2020 0417   LDLCALC 106 (H) 01/20/2020 0417   LDLDIRECT 135.0 06/29/2019 1458   Hepatic Function Panel     Component Value Date/Time   PROT 7.4 01/07/2024 1250   PROT 6.9 04/29/2023 1220   ALBUMIN 3.6 01/07/2024 1250   ALBUMIN 4.4 04/29/2023 1220   AST 18 01/07/2024 1250   ALT 25 01/07/2024 1250   ALKPHOS 61 01/07/2024 1250   BILITOT 0.5 01/07/2024 1250   BILITOT <0.2 04/29/2023 1220   BILIDIR <0.1 10/09/2018 0258   IBILI NOT CALCULATED 10/09/2018 0258      Component Value Date/Time   TSH 1.141 12/21/2022 1708   TSH 1.42 10/04/2021 1421     Assessment and Plan:   Visceral Obesity:  She has a visceral fat rating of 18 per the bio-impedence scale.   Plan: The goal is a visceral fat rating of 13 or below.  Will work on the plan and increase exercise/begin exercise.  Will minimize all carbohydrates ( sweets and starches ).    Type II Diabetes HgbA1c is not at goal. Last A1c was 7.6.  She states that she is seeing an endocrinologist.  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 Will keep all carbohydrates low both sweets and starches.  Will do chair exercises or any of her exercises recommended by her orthopedist.  Aim for 7 to 9 hours of sleep nightly.  Eat more low glycemic index foods.  She will begin the plan.   Morbid Obesity: Current BMI  46.94    Obesity Treatment / Action Plan:  Patient will work on garnering support from family and friends to begin weight loss journey. Will work on eliminating or reducing the presence of highly palatable, calorie dense foods in the home. Will complete provided nutritional and psychosocial assessment questionnaire before the next appointment. Will be scheduled for indirect calorimetry to determine resting energy expenditure in a fasting state.  This will allow us  to create a reduced calorie, high-protein meal plan to promote loss  of fat mass while preserving muscle mass. Counseled on the health benefits of losing 5%-15% of total body weight. Was counseled on nutritional approaches to weight loss and benefits of reducing processed foods and consuming plant-based foods and high quality protein as part of nutritional weight management. Was counseled on pharmacotherapy and role as an adjunct in weight management.   Obesity Education Performed Today:  She was weighed on the bioimpedance scale and results were discussed and documented in the synopsis.  We discussed obesity as a disease and the importance of a more detailed evaluation of all the factors contributing to the disease.  We discussed the importance of long term lifestyle changes which include nutrition, exercise and behavioral modifications as well as the importance of customizing this to her specific health and social needs.  We discussed the benefits of reaching a healthier weight to alleviate the symptoms of existing conditions and reduce the risks of the biomechanical, metabolic and psychological effects of obesity.  Discussed New Patient/Late Arrival, and Cancellation Policies. Patient voiced understanding and allowed to ask questions.   Sydney J Kloss appears to be in the action stage of change and states they are ready to start intensive lifestyle modifications and behavioral modifications.  30 minutes was spent today on this  visit including the above counseling, pre-visit chart review, and post-visit documentation.  Reviewed by clinician on day of visit: allergies, medications, problem list, medical history, surgical history, family history, social history, and previous encounter notes.    Gabe Glace A. Claiborne CrewO.

## 2024-02-16 DIAGNOSIS — Z0289 Encounter for other administrative examinations: Secondary | ICD-10-CM

## 2024-03-01 ENCOUNTER — Telehealth (INDEPENDENT_AMBULATORY_CARE_PROVIDER_SITE_OTHER): Admitting: Endocrinology

## 2024-03-01 ENCOUNTER — Encounter: Payer: Self-pay | Admitting: Endocrinology

## 2024-03-01 DIAGNOSIS — E1165 Type 2 diabetes mellitus with hyperglycemia: Secondary | ICD-10-CM

## 2024-03-01 DIAGNOSIS — Z794 Long term (current) use of insulin: Secondary | ICD-10-CM

## 2024-03-01 DIAGNOSIS — E892 Postprocedural hypoparathyroidism: Secondary | ICD-10-CM

## 2024-03-01 DIAGNOSIS — E890 Postprocedural hypothyroidism: Secondary | ICD-10-CM

## 2024-03-01 DIAGNOSIS — Z8639 Personal history of other endocrine, nutritional and metabolic disease: Secondary | ICD-10-CM

## 2024-03-01 NOTE — Progress Notes (Unsigned)
 Outpatient Endocrinology Note [place] Ivie Savitt, MD   Patient's Name: Heather Fry    DOB: 03-08-1976    MRN: [id number]  I connected with  Beckie Bow on 03/03/24 by a video enabled telemedicine application and verified that I am speaking with the correct person using two identifiers.   I discussed the limitations of evaluation and management by telemedicine. The patient expressed understanding and agreed to proceed.                                                     REASON OF VISIT: Follow up for type 2 diabetes mellitus  PCP: Kathyleen Parkins, MD  HISTORY OF PRESENT ILLNESS:   Heather Fry is a 48 y.o. old female with past medical history listed below, is here for follow up for type 2 diabetes mellitus.   Pertinent Diabetes History: Patient was previously seen by Dr. Washington Hacker and [facility].  Patient was diagnosed with type 2 diabetes mellitus in 2022.  She has uncontrolled type 2 diabetes mellitus.  No family history of medullary thyroid  carcinoma or MEN 2B syndrome.  Question of history of pancreatitis, patient reports had pancreatitis in the past ~2022, and in 12/2023 ?  from use of steroids/prednisone .  Based on chart review she had normal lipase multiple times, had CT abdomen with unremarkable pancreas in 04/2021.  Chronic Diabetes Complications : Retinopathy: no. Last ophthalmology exam was done on ?, following with ophthalmology regularly.  Nephropathy: ? Peripheral neuropathy: no Coronary artery disease: no Stroke: no  Relevant comorbidities and cardiovascular risk factors: Obesity: yes There is no height or weight on file to calculate BMI.  Hypertension: no Hyperlipidemia : Yes, on statin   Current / Home Diabetic regimen includes:  Lantus 22 units in the evening, if glucose > 250 Glimepiride 1 mg three times day daily with meals.  Acarbose  100 mg three times a day with meals.  Prior diabetic medications: Repaglinide  stopped due to nausea, glimepiride/Amaryl  had issues with hypoglycemia.  Pioglitazone stopped due to pedal edema.  Glycemic data:    CONTINUOUS GLUCOSE MONITORING SYSTEM (CGMS) INTERPRETATION: At today's visit, we reviewed CGM downloads. The full report is scanned in the media. Reviewing the CGM trends, blood glucose are as follows:  Dexcom G7 CGM-  Sensor Download (Sensor download was reviewed and summarized below.) Dates: May 21 to March 01, 2024  Glucose Management Indicator: 7.7% Sensor Average: 185 SD 35    Interpretation: - Mostly acceptable blood sugar with intermittent hyperglycemia with blood sugar up to 200-230 range postprandially with different meals usually with supper/bedtime and lunch and sometime with breakfast.  Blood sugar overnight trending down and acceptable blood sugar in between the meals.  No hypoglycemia.  Hypoglycemia: Patient has no hypoglycemic episodes. Patient has hypoglycemia awareness.  Factors modifying glucose control: 1.  Diabetic diet assessment: 3 meals a day.  2.  Staying active or exercising:   3.  Medication compliance: compliant most of the time.  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 Based on prior  note, patient has mild hypoparathyroidism / occasional mild hypocalcemia. On Vit D supplement 2000 international units daily.    Interval history  Diabetes regimen as reviewed above.  Mounjaro was denied.  She has not started  to take Synjardy .  Based on chart review it was approved by medical insurance.  CGM data as reviewed above.  She takes Lantus occasionally only when blood sugar is more than 250.  Patient reports her hemoglobin A1c was 7.9% about on Feb 05, 2024 at primary care office.  She would like to follow-up thyroid  disorder in our clinic as well.  She had followed up for hypothyroidism in the past with Dr. Washington Hacker in this clinic.  Lab on  10/29/2023: TSH 1.355, Ft4 : 0.99, levothyroxine  / Synthroid  137 mcg daily.   Patient reports she had lost couple of pounds of weight recently.  And recently in around May Feb 05, 2024 she had lab at primary care office with normal free T4 and free T3 however TSH was low 0.154.  Patient has been taking levothyroxine  137 mcg daily.  Patient reports she had steroid injection into the knee in both knees few days prior to her lab on May 9.  Patient denies palpitation or heat intolerance.  However she is having occasional sweating and she has swollen ankle.  She reports has been taking vitamin D3 2000 international unit daily.  She does not take calcium  pills.  She takes Tums as needed occasionally.   REVIEW OF SYSTEMS As per history of present illness.   PAST MEDICAL HISTORY: Past Medical History:  Diagnosis Date   Allergic asthma    Anemia    Anxiety    Arthritis    Depression    Diabetes mellitus without complication (HCC)    Diverticulosis    Fatty liver    moderate to severe   GERD (gastroesophageal reflux disease)    Graves disease    Headache    tension, ocular migraines   Hypothyroidism    Internal hemorrhoids    Prolonged Q-T interval on ECG    Sleep apnea    Thyroid  disease    Uterine fibroid     PAST SURGICAL HISTORY: Past Surgical History:  Procedure Laterality Date   ABDOMINAL HYSTERECTOMY     Per patient, uterine fibroids.   ABDOMINAL HYSTERECTOMY     BIOPSY  05/06/2023   Procedure: BIOPSY;  Surgeon: Suzette Espy, MD;  Location: [facility];  Service: Endoscopy;;   BREAST BIOPSY Right 10/23/2023   MM RT BREAST BX W LOC DEV 1ST LESION IMAGE BX SPEC STEREO GUIDE 10/23/2023 [facility]   CARPAL TUNNEL RELEASE Right 06/11/2021   Procedure: Right Carpel tunnel release;  Surgeon: Van Gelinas, MD;  Location: [facility] OR;  Service: Neurosurgery;  Laterality: Right;  RM 18   CHOLECYSTECTOMY N/A 11/06/2014   Procedure: LAPAROSCOPIC CHOLECYSTECTOMY;  Surgeon: Beau Bound, MD;  Location: [facility];  Service: General;  Laterality: N/A;   COLONOSCOPY N/A 03/04/2017   Dr. Riley Cheadle: Hemorrhoids, diverticulosis, benign polyp without adenomatous changes.  Next colonoscopy 10 years   COLONOSCOPY WITH PROPOFOL  N/A 08/28/2022   Procedure: COLONOSCOPY WITH PROPOFOL ;  Surgeon: Suzette Espy, MD;  Location: [facility];  Service: Endoscopy;  Laterality: N/A;  9:00 am   DILATION AND CURETTAGE OF UTERUS     ESOPHAGOGASTRODUODENOSCOPY N/A 10/02/2014   Dr. Riley Cheadle: fundic gland polyps, hiatal hernia   ESOPHAGOGASTRODUODENOSCOPY  07/2020   [facility] ; normal esophagus biopsied, benign-appearing gastric polyps biopsied, gastric biopsies obtained to evaluate for H. pylori, normal duodenum.  Pathology with mild chronic gastritis, negative for H. pylori, fundic gland polyps, esophageal biopsy benign.   ESOPHAGOGASTRODUODENOSCOPY (EGD) WITH PROPOFOL  N/A 05/06/2023  Procedure: ESOPHAGOGASTRODUODENOSCOPY (EGD) WITH PROPOFOL ;  Surgeon: Suzette Espy, MD;  [facility]: [facility];  Service: Endoscopy;  Laterality: N/A;  8:00 AM, [facility] 3   MOUTH SURGERY     extraction of teeth   POLYPECTOMY  03/04/2017   Procedure: POLYPECTOMY;  Surgeon: Suzette Espy, MD;  Location: [facility];  Service: Endoscopy;;  colon   RIGHT OOPHORECTOMY  07/2021   THYROIDECTOMY N/A 09/30/2018   Procedure: TOTAL THYROIDECTOMY;  Surgeon: Oralee Billow, MD;  Location: [facility];  Service: General;  Laterality: N/A;   TUBAL LIGATION      ALLERGIES: Allergies  Allergen Reactions   Bee Venom Shortness Of Breath, Swelling and Palpitations   Ketorolac  Tromethamine  Anaphylaxis, Hives and Other (See Comments)   Hydrochlorothiazide Other (See Comments)    Dehydration  Dehydration, Dehydration   Levofloxacin Other (See Comments)    Pt can not have due to taking Lexapro    Levomenol Other (See Comments)   Other Other (See Comments)   Tramadol  Hives   Triamcinolone Acetonide Other (See Comments)     Elevated blood pressure, caused diabetes, changed tyroid electrolytes Patient has no tyroid     Depo-Medrol  [Methylprednisolone ] Hypertension    Pt states high BP, high glucose and dizzy   Menthol Other (See Comments)   Budesonide -Formoterol  Fumarate Rash   Lisinopril Nausea Only and Other (See Comments)   Losartan  Potassium Nausea Only   Nystatin Rash    oral   Penicillins Other (See Comments)    Loopy, childhood allergy   ALL CILLINS  Unknown childhood reactions Has patient had a PCN reaction causing anaphylaxis, immediate rash, facial/tongue/throat swelling, SOB or lightheadedness with hypotension? no, Has patient had a PCN reaction causing severe rash involving mucus membranes or skin necrosis?  no, Has patient had a PCN reaction that required hospitilization?no, , Has patient had a PCN reaction occurring within the last 10 years?  no, If all of the above answers are "no" then may proceed with cephalosporin use.   Wound Dressing Adhesive Rash    FAMILY HISTORY:  Family History  Problem Relation Age of Onset   Allergic rhinitis Mother    Uterine cancer Mother        ?cervical cancer   Allergic rhinitis Father    Colon cancer Father 62   Hypertension Father    Diabetes Father    Colon polyps Father        age less than 36   Uterine cancer Sister        suicide   Colon cancer Paternal Aunt 73       also had skin cancer, ovarian?   Depression Other    Thyroid  disease Neg Hx    Pancreatic cancer Neg Hx     SOCIAL HISTORY: Social History   Socioeconomic History   Marital status: Married    Spouse name: Eliora Nienhuis   Number of children: Not on file   Years of education: 12   Highest education level: 12th grade  Occupational History   Not on file  Tobacco Use   Smoking status: Former    Current packs/day: 0.00    Average packs/day: 1 pack/day for 5.0 years (5.0 ttl pk-yrs)    Types: Cigarettes    Start date: 11/03/1992    Quit date: 11/03/1997    Years since  quitting: 26.3    Passive exposure: Current   Smokeless tobacco: Never   Tobacco comments:    1 pack 1 week    Smoking Cessation  Offered  Vaping Use   Vaping status: Never Used  Substance and Sexual Activity   Alcohol use: No    Alcohol/week: 0.0 standard drinks of alcohol   Drug use: No   Sexual activity: Yes    Partners: Male    Birth control/protection: Surgical  Other Topics Concern   Not on file  Social History Narrative   Not on file   Social Drivers of Health   Financial Resource Strain: Low Risk  (08/05/2022)   Overall Financial Resource Strain (CARDIA)    Difficulty of Paying Living Expenses: Not hard at all  Food Insecurity: No Food Insecurity (08/05/2022)   Hunger Vital Sign    Worried About Running Out of Food in the Last Year: Never true    Ran Out of Food in the Last Year: Never true  Transportation Needs: No Transportation Needs (08/05/2022)   PRAPARE - Administrator, Civil Service (Medical): No    Lack of Transportation (Non-Medical): No  Physical Activity: Inactive (08/05/2022)   Exercise Vital Sign    Days of Exercise per Week: 0 days    Minutes of Exercise per Session: 0 min  Stress: No Stress Concern Present (08/05/2022)   [employer] of Occupational Health - Occupational Stress Questionnaire    Feeling of Stress : Only a little  Social Connections: Socially Integrated (08/05/2022)   Social Connection and Isolation Panel [NHANES]    Frequency of Communication with Friends and Family: More than three times a week    Frequency of Social Gatherings with Friends and Family: More than three times a week    Attends Religious Services: More than 4 times per year    Active Member of [employer] or Organizations: Yes    Attends Engineer, structural: More than 4 times per year    Marital Status: Married    MEDICATIONS:  Current Outpatient Medications  Medication Sig Dispense Refill   albuterol  (VENTOLIN  HFA) 108 (90 Base) MCG/ACT inhaler  Inhale 2 puffs into the lungs every 6 (six) hours as needed. 18 g 11   Ascorbic Acid (VITAMIN C) 1000 MG tablet Take 2,500 mg by mouth daily.     cetirizine  (ZYRTEC ) 10 MG tablet Take 10 mg by mouth daily.     Cholecalciferol  (VITAMIN D3) 125 MCG (5000 UT) CAPS Take 1 capsule (5,000 Units total) by mouth daily. (Patient taking differently: Take 10,000 Units by mouth daily.) 30 capsule 11   Continuous Blood Gluc Sensor (DEXCOM G7 SENSOR) MISC USE AS DIRECTED EVERY 10 DAYS     Empagliflozin-metFORMIN HCl ER (SYNJARDY  XR) 01-999 MG TB24 Take 2 tablets by mouth daily. 90 tablet 3   EPINEPHrine  0.3 mg/0.3 mL IJ SOAJ injection Inject 0.3 mg into the muscle as needed for anaphylaxis. 2 each 1   EQUATE STOOL SOFTENER 100 MG capsule Take 100 mg by mouth 2 (two) times daily as needed for moderate constipation.     escitalopram  (LEXAPRO ) 20 MG tablet Take 1 tablet (20 mg total) by mouth at bedtime. 30 tablet 5   fluticasone  (FLONASE ) 50 MCG/ACT nasal spray Place 2 sprays into both nostrils at bedtime as needed for allergies.     glucose blood (ACCU-CHEK AVIVA PLUS) test strip 1 each by Other route daily. And lancets 1/day 100 each 3   Investigational omega-3-fatty acid/placebo capsule S0927 Take 3 capsules by mouth 2 (two) times daily. Take with food.     ipratropium (ATROVENT ) 0.02 % nebulizer solution INHALE 1 VIAL IN NEBULIZER EVERY  6 HOURS AS NEEDED 180 mL 0   Magnesium  250 MG TABS Take 250 mg by mouth at bedtime.      NON FORMULARY at bedtime. CPAP at night     omeprazole  (PRILOSEC) 40 MG capsule Take 40 mg by mouth 2 (two) times daily.     ondansetron  (ZOFRAN -ODT) 4 MG disintegrating tablet Take 1 tablet (4 mg total) by mouth every 8 (eight) hours as needed for nausea or vomiting. 20 tablet 0   rosuvastatin  (CRESTOR ) 10 MG tablet Take 10 mg by mouth every Saturday.     Simethicone  250 MG CAPS Take 500 mg by mouth at bedtime.     sucralfate  (CARAFATE ) 1 GM/10ML suspension TAKE 10 ML BY MOUTH  4 TIMES  DAILY AS NEEDED 828 mL 0   acarbose  (PRECOSE ) 100 MG tablet Take 1 tablet (100 mg total) by mouth 3 (three) times daily with meals. 270 tablet 3   Continuous Glucose Sensor (DEXCOM G7 SENSOR) MISC 1 Device by Does not apply route continuous. 9 each 3   glimepiride (AMARYL) 1 MG tablet Take 1 tablet (1 mg total) by mouth daily as needed (blood glucose above 200). 270 tablet 3   LANTUS SOLOSTAR 100 UNIT/ML Solostar Pen Inject 15 Units into the skin daily. 15 mL 4   SYNTHROID  137 MCG tablet Take 1 tablet (137 mcg total) by mouth daily before breakfast. 90 tablet 4   No current facility-administered medications for this visit.    PHYSICAL EXAM: There were no vitals filed for this visit. There is no height or weight on file to calculate BMI.  Wt Readings from Last 3 Encounters:  02/11/24 265 lb (120.2 kg)  02/03/24 265 lb (120.2 kg)  01/20/24 269 lb (122 kg)    General: Well developed, well nourished female in no apparent distress.  HEENT: AT/[place], no external lesions.  Eyes: Conjunctiva clear and no icterus. Neurologic: Alert, oriented, normal speech Psychiatric: Does not appear depressed or anxious  Diabetic Foot Exam - Simple   No data filed    LABS Reviewed Lab Results  Component Value Date   HGBA1C 7.6 (A) 09/02/2023   HGBA1C 7.3 (H) 04/04/2022   HGBA1C 8.0 (H) 08/08/2021   Lab Results  Component Value Date   FRUCTOSAMINE 253 10/04/2021   Lab Results  Component Value Date   CHOL 172 04/29/2023   HDL 55 01/20/2020   LDLCALC 106 (H) 01/20/2020   LDLDIRECT 135.0 06/29/2019   TRIG 279 (H) 04/29/2023   CHOLHDL 4.0 01/20/2020   No results found for: "MICRALBCREAT" Lab Results  Component Value Date   CREATININE 0.66 01/07/2024   Lab Results  Component Value Date   GFR 90.22 10/04/2021    ASSESSMENT / PLAN  1. Uncontrolled type 2 diabetes mellitus with hyperglycemia (HCC)   2. Postsurgical hypothyroidism   3. Hypocalcemia   4. Surgical hypoparathyroidism (HCC)    5. H/O Graves' disease     Diabetes Mellitus type 2, complicated by no known complication. - Diabetic status / severity: Uncontrolled.  Lab Results  Component Value Date   HGBA1C 7.6 (A) 09/02/2023    - Hemoglobin A1c goal : <6.5%  Patient reports she had hemoglobin A1c of 7.9% in Feb 05, 2024.  She had a steroid injection into the bilateral knees about 1 month ago.  CGM data as reviewed above.  Intermittent hyperglycemia.  Adjusted diabetes regimen as follows.  - Medications: See below.  I) continue acarbose  100 mg with meals 3 times a day. II)  continue glimepiride 1 mg with meals 3 times a day. III) advised to take Lantus 15 units daily.  Rather than taking occasionally as needed when blood sugar is more than 250. IV) start Synjardy  extended release 01-999 mg 1 tablet daily.  Patient has questionable history of pancreatitis, refer to HPI for detail.  Will discuss further with patient if/when we plan to start on GLP-1 receptor agonist.  - Home glucose testing: Dexcom G7 CGM and check as needed.  - Discussed/ Gave Hypoglycemia treatment plan.  # Consult : not required at this time.   # Annual urine for microalbuminuria/ creatinine ratio, no microalbuminuria currently. Last No results found for: "MICRALBCREAT"  # Foot check nightly.  # Annual dilated diabetic eye exams.   - Diet: Make healthy diabetic food choices - Life style / activity / exercise: Discussed.  2. Blood pressure  -  BP Readings from Last 1 Encounters:  02/11/24 (!) 146/96    - Control is in target.  - No change in current plans.  3. Lipid status / Hyperlipidemia - Last  Lab Results  Component Value Date   LDLCALC 106 (H) 01/20/2020   - Continue rosuvastatin  10 mg daily.  Managed by primary care provider.  # Postsurgical hypothyroidism : - She underwent total thyroidectomy for Graves' disease/hyperthyroidism in July 2020. - She has been currently taking levothyroxine  137 mcg daily.  She  had normal thyroid  function test on this dose in January 2025.  Recently she reports she had mildly low TSH with normal free T4 and free T3 however she had steroid injection few days prior to the lab test.  Discussed that steroids can decrease the TSH. - I do not want to decrease the dose of levothyroxine  at this time.  She has no hyperthyroid symptoms.  Will continue the current dose of 137 mcg daily and will recheck labs in the follow-up visit.  # Mild hypoparathyroidism/hypocalcemia/vitamin D  deficiency -Currently taking vitamin D3 2000 international unit daily.  She is not taking calcium  regularly.  She eats dairy products. -Advised patient to take calcium  around 500 mg 1 tablet daily along with dietary intake.  Continue current dose of vitamin D3. -Will check renal function panel, PTH and vitamin D  level with next set of lab.  Diagnoses and all orders for this visit:  Uncontrolled type 2 diabetes mellitus with hyperglycemia (HCC) -     Hemoglobin A1c -     Empagliflozin-metFORMIN HCl ER (SYNJARDY  XR) 01-999 MG TB24; Take 2 tablets by mouth daily. -     acarbose  (PRECOSE ) 100 MG tablet; Take 1 tablet (100 mg total) by mouth 3 (three) times daily with meals. -     Continuous Glucose Sensor (DEXCOM G7 SENSOR) MISC; 1 Device by Does not apply route continuous. -     glimepiride (AMARYL) 1 MG tablet; Take 1 tablet (1 mg total) by mouth daily as needed (blood glucose above 200). -     LANTUS SOLOSTAR 100 UNIT/ML Solostar Pen; Inject 15 Units into the skin daily.  Postsurgical hypothyroidism -     T4, free -     TSH -     SYNTHROID  137 MCG tablet; Take 1 tablet (137 mcg total) by mouth daily before breakfast.  Hypocalcemia -     Renal function panel -     Parathyroid hormone, intact (no Ca) -     VITAMIN D  25 Hydroxy (Vit-D Deficiency, Fractures)  Surgical hypoparathyroidism (HCC) -     VITAMIN D  25 Hydroxy (Vit-D  Deficiency, Fractures)  H/O Graves' disease    DISPOSITION Follow up  in clinic in 3 months suggested.  Labs prior to follow-up visit as ordered.   All questions answered and patient verbalized understanding of the plan.  [place] Harmony Sandell, MD [facility] [address] [place], [address] [place], [place] [address] Phone # [phone]  At least part of this note was generated using voice recognition software. Inadvertent word errors may have occurred, which were not recognized during the proofreading process.

## 2024-03-03 MED ORDER — GLIMEPIRIDE 1 MG PO TABS
1.0000 mg | ORAL_TABLET | Freq: Every day | ORAL | 3 refills | Status: DC | PRN
Start: 2024-03-03 — End: 2024-06-17

## 2024-03-03 MED ORDER — LANTUS SOLOSTAR 100 UNIT/ML ~~LOC~~ SOPN: 15.0000 [IU] | PEN_INJECTOR | Freq: Every day | SUBCUTANEOUS | 4 refills | Status: DC

## 2024-03-03 MED ORDER — SYNTHROID 137 MCG PO TABS
137.0000 ug | ORAL_TABLET | Freq: Every day | ORAL | 4 refills | Status: DC
Start: 2024-03-03 — End: 2024-03-07

## 2024-03-03 MED ORDER — SYNJARDY XR 5-1000 MG PO TB24: 2.0000 | ORAL_TABLET | Freq: Every day | ORAL | 3 refills | Status: DC

## 2024-03-03 MED ORDER — ACARBOSE 100 MG PO TABS
100.0000 mg | ORAL_TABLET | Freq: Three times a day (TID) | ORAL | 3 refills | Status: AC
Start: 2024-03-03 — End: ?

## 2024-03-03 MED ORDER — DEXCOM G7 SENSOR MISC: 1.0000 | 3 refills | Status: DC

## 2024-03-07 ENCOUNTER — Ambulatory Visit: Admitting: Student in an Organized Health Care Education/Training Program

## 2024-03-07 VITALS — BP 148/84 | HR 90 | Ht 63.0 in | Wt 270.0 lb

## 2024-03-07 DIAGNOSIS — M170 Bilateral primary osteoarthritis of knee: Secondary | ICD-10-CM

## 2024-03-07 DIAGNOSIS — I10 Essential (primary) hypertension: Secondary | ICD-10-CM | POA: Diagnosis not present

## 2024-03-07 DIAGNOSIS — G4733 Obstructive sleep apnea (adult) (pediatric): Secondary | ICD-10-CM

## 2024-03-07 DIAGNOSIS — Z7984 Long term (current) use of oral hypoglycemic drugs: Secondary | ICD-10-CM

## 2024-03-07 DIAGNOSIS — E785 Hyperlipidemia, unspecified: Secondary | ICD-10-CM

## 2024-03-07 DIAGNOSIS — J4540 Moderate persistent asthma, uncomplicated: Secondary | ICD-10-CM

## 2024-03-07 DIAGNOSIS — E1165 Type 2 diabetes mellitus with hyperglycemia: Secondary | ICD-10-CM

## 2024-03-07 DIAGNOSIS — N951 Menopausal and female climacteric states: Secondary | ICD-10-CM

## 2024-03-07 DIAGNOSIS — E890 Postprocedural hypothyroidism: Secondary | ICD-10-CM | POA: Diagnosis not present

## 2024-03-07 DIAGNOSIS — F32A Depression, unspecified: Secondary | ICD-10-CM

## 2024-03-07 DIAGNOSIS — E66813 Obesity, class 3: Secondary | ICD-10-CM

## 2024-03-07 LAB — POCT GLYCOSYLATED HEMOGLOBIN (HGB A1C): Hemoglobin A1C: 7.5 % — AB (ref 4.0–5.6)

## 2024-03-07 MED ORDER — LEVOTHYROXINE SODIUM 150 MCG PO TABS: 150.0000 ug | ORAL_TABLET | Freq: Every day | ORAL | Status: DC

## 2024-03-07 NOTE — Assessment & Plan Note (Signed)
 Chronic and stable.  No history of ischemic vascular events.  Currently using rosuvastatin  10 mg 3 times weekly and tolerating that well.  She has a history of atypical chest pain and this has been evaluated by cardiology.

## 2024-03-07 NOTE — Assessment & Plan Note (Signed)
 Chronic and stable.  Managed with [facility].  Has had serious complications to intra-articular steroid injections in the past.  She would like to go for knee replacement surgery in the future but needs to lose significant weight first.  Currently she walks with a cane.  Her knee pain is functional limiting.

## 2024-03-07 NOTE — Assessment & Plan Note (Signed)
 Chronic and stable.  Weight today is 270 pounds with a BMI of 47.  She has numerous complications of obesity, she has metabolic syndrome with hypertension, hyperlipidemia, and steatosis of the liver.  She has not been able to tolerate GLP-1 agonists, and I doubt she will be able to tolerate other medication assisted treatments.  This will be managed with healthy weight and wellness clinic.  We talked about nutrition, and its importance and weight loss.  Patient reports frequent snacking, usually has 6 small meals per day.  I think doing a more clear calorie counting and running a calorie deficit on a consistent basis would probably be the most effective and low side effect approach to her weight loss.

## 2024-03-07 NOTE — Assessment & Plan Note (Signed)
 New problem over the last few months.  Being managed with gynecology.  We talked briefly about hormone replacement therapy as the most effective medication assisted treatment for vasomotor symptoms.  She will have to get consider carefully the side effect profiles of these hormones.

## 2024-03-07 NOTE — Assessment & Plan Note (Signed)
 Chronic issue of elevated blood pressure.  Patient declines medication assisted treatment of hypertension due to history of side effects, electrolyte abnormalities.  She also reports there is a lot of other factors that cause her blood pressure to temporarily be elevated.

## 2024-03-07 NOTE — Assessment & Plan Note (Signed)
 Chronic and stable.  She has moderate depressed mood with moderate burden of symptoms, associated with her degree of medical comorbidities, symptom burden, decreased functional status, and isolation.  Doing pretty well on Lexapro  for the last 15 years.

## 2024-03-07 NOTE — Progress Notes (Signed)
 New Patient Office Visit  Subjective    Patient ID: Heather Fry, female    DOB: [birth year]  Age: 48 y.o. MRN: [id number]  CC:  Chief Complaint  Patient presents with   Establish Care    Patient states she is having a little trouble with her asthma but is seeing pulmonologist soon.     HPI  48 year old person with multiple medical problems here to establish care because her primary care physician in [place] is retiring in September.  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  About 5 years ago she started to have declining functional status due to bilateral knee pain.  She had a complicated course of a thyroidectomy around that time as well.  Everything started to decline for her around then.  With decreasing functional status she started to gain weight which made her even less functional.  At this point she lives at home with her husband and her daughter.  Husband also has multiple medical problems.  She struggles to get out of bed on a daily basis.  He is able to do some light walking outside of the house, independent in things like shopping at times but also at times needs significant help from her family.  She has of a large number of medical specialists managing many of her chronic problems.  She has a history of complications with many medical interventions, frequent emergency department visits after starting new medications or new treatments.  Most recently she had a steroid injection into her knee that caused her significant hypertension.  Right now her main focus over the next 6 months is and trying to lose weight.  She has a consultation coming up with healthy weight and wellness.  She reports that her mood has been stable.  From my office she is also looking for someone to see her for acute visits when needed.  She is also worried about electrolytes disturbances in the future.    Outpatient Encounter Medications as of 03/07/2024  Medication Sig    acarbose  (PRECOSE ) 100 MG tablet Take 1 tablet (100 mg total) by mouth 3 (three) times daily with meals.   albuterol  (VENTOLIN  HFA) 108 (90 Base) MCG/ACT inhaler Inhale 2 puffs into the lungs every 6 (six) hours as needed.   Ascorbic Acid (VITAMIN C) 1000 MG tablet Take 2,500 mg by mouth daily.   cetirizine  (ZYRTEC ) 10 MG tablet Take 10 mg by mouth daily.   Cholecalciferol  (VITAMIN D3) 125 MCG (5000 UT) CAPS Take 1 capsule (5,000 Units total) by mouth daily. (Patient taking differently: Take 10,000 Units by mouth daily.)   Continuous Blood Gluc Sensor (DEXCOM G7 SENSOR) MISC USE AS DIRECTED EVERY 10 DAYS   Continuous Glucose Sensor (DEXCOM G7 SENSOR) MISC 1 Device by Does not apply route continuous.   EPINEPHrine  0.3 mg/0.3 mL IJ SOAJ injection Inject 0.3 mg into the muscle as needed for anaphylaxis.   EQUATE STOOL SOFTENER 100 MG capsule Take 100 mg by mouth 2 (two) times daily as needed for moderate constipation.   escitalopram  (LEXAPRO ) 20 MG tablet Take 1 tablet (20 mg total) by mouth at bedtime.   fluticasone  (FLONASE ) 50 MCG/ACT nasal spray Place 2 sprays into both nostrils at bedtime as needed for allergies.   glimepiride  (AMARYL ) 1 MG tablet Take 1 tablet (1 mg total) by mouth daily as needed (blood glucose above 200).   glucose blood (ACCU-CHEK AVIVA PLUS) test strip 1 each by Other route daily. And lancets  1/day   ipratropium (ATROVENT ) 0.02 % nebulizer solution INHALE 1 VIAL IN NEBULIZER EVERY 6 HOURS AS NEEDED   LANTUS  SOLOSTAR 100 UNIT/ML Solostar Pen Inject 15 Units into the skin daily.   levothyroxine  (SYNTHROID ) 150 MCG tablet Take 1 tablet (150 mcg total) by mouth daily.   Magnesium  250 MG TABS Take 250 mg by mouth at bedtime.    NON FORMULARY at bedtime. CPAP at night   omeprazole  (PRILOSEC) 40 MG capsule Take 40 mg by mouth 2 (two) times daily.   ondansetron  (ZOFRAN -ODT) 4 MG disintegrating tablet Take 1 tablet (4 mg total) by mouth every 8 (eight) hours as needed for nausea or  vomiting.   rosuvastatin  (CRESTOR ) 10 MG tablet Take 10 mg by mouth every Saturday.   Simethicone  250 MG CAPS Take 500 mg by mouth at bedtime.   sucralfate  (CARAFATE ) 1 GM/10ML suspension TAKE 10 ML BY MOUTH  4 TIMES DAILY AS NEEDED   [DISCONTINUED] Empagliflozin-metFORMIN HCl ER (SYNJARDY  XR) 01-999 MG TB24 Take 2 tablets by mouth daily.   [DISCONTINUED] Investigational omega-3-fatty acid/placebo capsule S0927 Take 3 capsules by mouth 2 (two) times daily. Take with food.   [DISCONTINUED] SYNTHROID  137 MCG tablet Take 1 tablet (137 mcg total) by mouth daily before breakfast.   No facility-administered encounter medications on file as of 03/07/2024.    Past Medical History:  Diagnosis Date   Allergic asthma    Anemia    Anxiety    Arthritis    Depression    Diabetes mellitus without complication (HCC)    Diverticulosis    Fatty liver    moderate to severe   GERD (gastroesophageal reflux disease)    Graves disease    Headache    tension, ocular migraines   Hypothyroidism    Internal hemorrhoids    Prolonged Q-T interval on ECG    Sleep apnea    Thyroid  disease    Uterine fibroid     Past Surgical History:  Procedure Laterality Date   ABDOMINAL HYSTERECTOMY     Per patient, uterine fibroids.   ABDOMINAL HYSTERECTOMY     BIOPSY  05/06/2023   Procedure: BIOPSY;  Surgeon: Suzette Espy, MD;  Location: [facility];  Service: Endoscopy;;   BREAST BIOPSY Right 10/23/2023   MM RT BREAST BX W LOC DEV 1ST LESION IMAGE BX SPEC STEREO GUIDE 10/23/2023 [facility]   CARPAL TUNNEL RELEASE Right 06/11/2021   Procedure: Right Carpel tunnel release;  Surgeon: Van Gelinas, MD;  Location: [facility] OR;  Service: Neurosurgery;  Laterality: Right;  RM 18   CHOLECYSTECTOMY N/A 11/06/2014   Procedure: LAPAROSCOPIC CHOLECYSTECTOMY;  Surgeon: Beau Bound, MD;  Location: [facility];  Service: General;  Laterality: N/A;   COLONOSCOPY N/A 03/04/2017   Dr. Riley Cheadle: Hemorrhoids, diverticulosis,  benign polyp without adenomatous changes.  Next colonoscopy 10 years   COLONOSCOPY WITH PROPOFOL  N/A 08/28/2022   Procedure: COLONOSCOPY WITH PROPOFOL ;  Surgeon: Suzette Espy, MD;  [facility]: [facility];  Service: Endoscopy;  Laterality: N/A;  9:00 am   DILATION AND CURETTAGE OF UTERUS     ESOPHAGOGASTRODUODENOSCOPY N/A 10/02/2014   Dr. Riley Cheadle: fundic gland polyps, hiatal hernia   ESOPHAGOGASTRODUODENOSCOPY  07/2020   [facility] ; normal esophagus biopsied, benign-appearing gastric polyps biopsied, gastric biopsies obtained to evaluate for H. pylori, normal duodenum.  Pathology with mild chronic gastritis, negative for H. pylori, fundic gland polyps, esophageal biopsy benign.   ESOPHAGOGASTRODUODENOSCOPY (EGD) WITH PROPOFOL  N/A 05/06/2023   Procedure:  ESOPHAGOGASTRODUODENOSCOPY (EGD) WITH PROPOFOL ;  Surgeon: Suzette Espy, MD;  [facility]: [facility];  Service: Endoscopy;  Laterality: N/A;  8:00 AM, ASA 3   MOUTH SURGERY     extraction of teeth   POLYPECTOMY  03/04/2017   Procedure: POLYPECTOMY;  Surgeon: Suzette Espy, MD;  Location: [facility];  Service: Endoscopy;;  colon   RIGHT OOPHORECTOMY  07/2021   THYROIDECTOMY N/A 09/30/2018   Procedure: TOTAL THYROIDECTOMY;  Surgeon: Oralee Billow, MD;  Location: [facility];  Service: General;  Laterality: N/A;   TUBAL LIGATION      Family History  Problem Relation Age of Onset   Allergic rhinitis Mother    Uterine cancer Mother        ?cervical cancer   Allergic rhinitis Father    Colon cancer Father 53   Hypertension Father    Diabetes Father    Colon polyps Father        age less than 59   Uterine cancer Sister        suicide   Colon cancer Paternal Aunt 60       also had skin cancer, ovarian?   Depression Other    Thyroid  disease Neg Hx    Pancreatic cancer Neg Hx     Social History   Socioeconomic History   Marital status: Married    Spouse name: Treina Arscott   Number of children: Not on file   Years of  education: 12   Highest education level: 12th grade  Occupational History   Not on file  Tobacco Use   Smoking status: Former    Current packs/day: 0.00    Average packs/day: 1 pack/day for 5.0 years (5.0 ttl pk-yrs)    Types: Cigarettes    Start date: 11/03/1992    Quit date: 11/03/1997    Years since quitting: 26.3    Passive exposure: Current   Smokeless tobacco: Never   Tobacco comments:    1 pack 1 week    Smoking Cessation Offered  Vaping Use   Vaping status: Never Used  Substance and Sexual Activity   Alcohol use: No    Alcohol/week: 0.0 standard drinks of alcohol   Drug use: No   Sexual activity: Yes    Partners: Male    Birth control/protection: Surgical  Other Topics Concern   Not on file  Social History Narrative   Not on file   Social Drivers of Health   Financial Resource Strain: Low Risk  (03/07/2024)   Overall Financial Resource Strain (CARDIA)    Difficulty of Paying Living Expenses: Not very hard  Food Insecurity: Food Insecurity Present (03/07/2024)   Hunger Vital Sign    Worried About Running Out of Food in the Last Year: Sometimes true    Ran Out of Food in the Last Year: Sometimes true  Transportation Needs: No Transportation Needs (03/07/2024)   PRAPARE - Administrator, Civil Service (Medical): No    Lack of Transportation (Non-Medical): No  Physical Activity: Insufficiently Active (03/07/2024)   Exercise Vital Sign    Days of Exercise per Week: 1 day    Minutes of Exercise per Session: 20 min  Stress: Stress Concern Present (03/07/2024)   [employer] of Occupational Health - Occupational Stress Questionnaire    Feeling of Stress : To some extent  Social Connections: Moderately Isolated (03/07/2024)   Social Connection and Isolation Panel [NHANES]    Frequency of Communication with Friends and Family: More than  three times a week    Frequency of Social Gatherings with Friends and Family: Twice a week    Attends Religious Services: Never     Database administrator or Organizations: No    Attends Engineer, structural: Not on file    Marital Status: Married  Catering manager Violence: Not At Risk (10/22/2023)   Received from [facility]   Humiliation, Afraid, Rape, and Kick questionnaire    Fear of Current or Ex-Partner: No    Emotionally Abused: No    Physically Abused: No    Sexually Abused: No        Objective    BP (!) 148/84   Pulse 90   Ht 5\' 3"  (1.6 m)   Wt 270 lb (122.5 kg)   LMP 10/09/2014   SpO2 100%   BMI 47.83 kg/m   Physical Exam  Gen: Well-appearing woman Eyes: Normal Mouth: Crowded oropharynx, multiple dental caries Neck: Thyroidectomy scar, increased circumference, no adenopathy or nodules Heart: Distant heart sounds, regular with no murmur Lungs: Distant lung sounds, unlabored, no crackles or wheezing Abd: Obese, soft Ext: Warm, no edema, mild osteoarthritis in bilateral hands Neuro: Alert, conversational, full strength upper and lower extremities, walks with a cane, moderately delayed get up and go, unsteady gait Psych: Pleasant to talk with, not anxious or depressed appearing, normal speech, organized thoughts      Assessment & Plan:   Problem List Items Addressed This Visit       High   Depression (Chronic)   Chronic and stable.  She has moderate depressed mood with moderate burden of symptoms, associated with her degree of medical comorbidities, symptom burden, decreased functional status, and isolation.  Doing pretty well on Lexapro  for the last 15 years.      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  Weight today is 270 pounds with a BMI of 47.  She has numerous complications of obesity, she has metabolic syndrome with hypertension, hyperlipidemia, and steatosis of the liver.  She has not been able to tolerate GLP-1 agonists, and I doubt she will be able to tolerate other medication assisted treatments.  This will be managed  with healthy weight and wellness clinic.  We talked about nutrition, and its importance and weight loss.  Patient reports frequent snacking, usually has 6 small meals per day.  I think doing a more clear calorie counting and running a calorie deficit on a consistent basis would probably be the most effective and low side effect approach to her weight loss.      Postoperative hypothyroidism (Chronic)   Chronic and stable.  Managed by Dr. Aretha Kubas from our endocrinology.  Patient has had difficulty controlling her TSH, and often has symptoms of hypo and hyperthyroidism.  Today she is doing well with her symptoms.  Recently had an increase in Synthroid  to 150 mcg daily.      Relevant Medications   levothyroxine  (SYNTHROID ) 150 MCG tablet   Type 2 diabetes mellitus with hyperglycemia (HCC) - Primary (Chronic)   Chronic and stable.  Managed by Dr. Aretha Kubas with our endocrinology.  Last A1c was 7.9% in early May.  No clear complications of diabetes.  Very sensitive to medication changes.  Doing well with CGM right now.  Medication regimen includes acarbose , glimepiride , Lantus  15 units daily.  Unable to tolerate GLP-1 agonist and SGLT2 inhibitor in the past.      Relevant Orders   POCT glycosylated  hemoglobin (Hb A1C) (Completed)   Moderate persistent asthma, uncomplicated (Chronic)   Chronic and stable.  Previously managed by Dr. Idolina Maker with allergy  and immunology.  She has a consultation coming up in [place] with Dr. Pecola Bouquet with pulmonology.  She has frequent exacerbations.  Associated with multiple seasonal allergies and medication allergies.  Currently she is only using albuterol  and Atrovent  nebulizer as needed.      Bilateral primary osteoarthritis of knee (Chronic)   Chronic and stable.  Managed with [facility].  Has had serious complications to intra-articular steroid injections in the past.  She would like to go for knee replacement surgery in the future but needs to lose significant  weight first.  Currently she walks with a cane.  Her knee pain is functional limiting.        Medium    Perimenopausal symptoms (Chronic)   New problem over the last few months.  Being managed with gynecology.  We talked briefly about hormone replacement therapy as the most effective medication assisted treatment for vasomotor symptoms.  She will have to get consider carefully the side effect profiles of these hormones.      OSA (obstructive sleep apnea) (Chronic)   Chronic and stable.  Doing well with CPAP nightly.      Dyslipidemia (Chronic)   Chronic and stable.  No history of ischemic vascular events.  Currently using rosuvastatin  10 mg 3 times weekly and tolerating that well.  She has a history of atypical chest pain and this has been evaluated by cardiology.      Essential hypertension (Chronic)   Chronic issue of elevated blood pressure.  Patient declines medication assisted treatment of hypertension due to history of side effects, electrolyte abnormalities.  She also reports there is a lot of other factors that cause her blood pressure to temporarily be elevated.       Return in about 6 months (around 09/06/2024).   Ether Hercules, MD

## 2024-03-07 NOTE — Assessment & Plan Note (Signed)
 Chronic and stable.  Managed by Dr. Aretha Kubas from our endocrinology.  Patient has had difficulty controlling her TSH, and often has symptoms of hypo and hyperthyroidism.  Today she is doing well with her symptoms.  Recently had an increase in Synthroid  to 150 mcg daily.

## 2024-03-07 NOTE — Assessment & Plan Note (Signed)
 Chronic and stable.  Previously managed by Dr. Idolina Maker with allergy  and immunology.  She has a consultation coming up in [place] with Dr. Pecola Bouquet with pulmonology.  She has frequent exacerbations.  Associated with multiple seasonal allergies and medication allergies.  Currently she is only using albuterol  and Atrovent  nebulizer as needed.

## 2024-03-07 NOTE — Assessment & Plan Note (Signed)
 Chronic and stable.  Managed by Dr. Aretha Kubas with our endocrinology.  Last A1c was 7.9% in early May.  No clear complications of diabetes.  Very sensitive to medication changes.  Doing well with CGM right now.  Medication regimen includes acarbose , glimepiride , Lantus  15 units daily.  Unable to tolerate GLP-1 agonist and SGLT2 inhibitor in the past.

## 2024-03-07 NOTE — Assessment & Plan Note (Signed)
 Chronic and stable.  Doing well with CPAP nightly.

## 2024-03-10 ENCOUNTER — Telehealth: Payer: Self-pay

## 2024-03-10 ENCOUNTER — Telehealth: Payer: Self-pay | Admitting: Pharmacy Technician

## 2024-03-10 ENCOUNTER — Other Ambulatory Visit (HOSPITAL_COMMUNITY): Payer: Self-pay

## 2024-03-10 MED ORDER — FREESTYLE LIBRE 3 PLUS SENSOR MISC
1 refills | Status: DC
Start: 2024-03-10 — End: 2024-04-13

## 2024-03-10 NOTE — Addendum Note (Signed)
 Addended by: Cain Fitzhenry K on: 03/10/2024 01:11 PM   Modules accepted: Orders

## 2024-03-10 NOTE — Telephone Encounter (Signed)
 Yes, please, thank you.

## 2024-03-10 NOTE — Telephone Encounter (Signed)
 Copied from CRM [id number]. Topic: Clinical - Medication Question >> Mar 10, 2024 11:59 AM Aisha D wrote: Reason for CRM: Pt is calling to check the status of the request for the Continuous Glucose Sensor and also has 2 hours left on the sensor. I informed the pt that the PA was submitted and is currently pending. Pt would like to receive a callback once it has been approved.

## 2024-03-10 NOTE — Telephone Encounter (Signed)
 Patient has been informed this has been sent and will check with them later today may need PA

## 2024-03-10 NOTE — Telephone Encounter (Signed)
 Pharmacy Patient Advocate Encounter   Received notification from CoverMyMeds that prior authorization for Dexcom G7 Sensor is required/requested.   Insurance verification completed.   The patient is insured through [facility] MEDICAID .   Per test claim: PA required; PA submitted to above mentioned insurance via CoverMyMeds Key/confirmation #/EOC [id number] Status is pending

## 2024-03-10 NOTE — Telephone Encounter (Signed)
 Pharmacy Patient Advocate Encounter   Received notification from CoverMyMeds that prior authorization for Synjardy  XR 5-1000MG  er tablets is required/requested.   Insurance verification completed.   The patient is insured through [facility] MEDICAID .   Action: Medication has been discontinued. Archived Key: [id number]

## 2024-03-10 NOTE — Telephone Encounter (Signed)
 Patient is requesting a CGM, okay to order this as requested?

## 2024-03-10 NOTE — Telephone Encounter (Addendum)
 Pharmacy Patient Advocate Encounter  Received notification from [facility] MEDICAID that Prior Authorization for Dexcom G7 Sensor has been APPROVED from 03/10/2024 to 03/10/2025   PA #/Case ID/Reference #: [id number]

## 2024-03-11 ENCOUNTER — Other Ambulatory Visit (HOSPITAL_COMMUNITY): Payer: Self-pay

## 2024-03-11 MED ORDER — PANTOPRAZOLE SODIUM 40 MG PO TBEC
40.0000 mg | DELAYED_RELEASE_TABLET | Freq: Two times a day (BID) | ORAL | 1 refills | Status: DC
Start: 2024-03-11 — End: 2024-03-12

## 2024-03-12 ENCOUNTER — Other Ambulatory Visit: Payer: Self-pay | Admitting: Gastroenterology

## 2024-03-12 MED ORDER — ESOMEPRAZOLE MAGNESIUM 40 MG PO CPDR: 40.0000 mg | DELAYED_RELEASE_CAPSULE | Freq: Two times a day (BID) | ORAL | 1 refills | Status: DC

## 2024-03-15 ENCOUNTER — Telehealth: Payer: Self-pay

## 2024-03-15 NOTE — Telephone Encounter (Signed)
 Pt reports since removal of her thyroid  4 years ago she notes a tingly feeling across her body mostly right face, hands and feet when her calcium  is low noted the feeling first this weekend. Okay to do lab to check calcium 

## 2024-03-15 NOTE — Telephone Encounter (Signed)
 Called patient and advised her of this, ellected to go to the hospital as she reports she cannot come here or [place] for labs tomorrow morning.

## 2024-03-15 NOTE — Telephone Encounter (Signed)
 Copied from CRM [id number]. Topic: Clinical - Request for Lab/Test Order >> Mar 15, 2024  3:12 PM Baldo Levan wrote: Reason for CRM: Patient is calling in stating that she is feeling tingly, patient is requesting lab work to check her levels for calcium  and electrolytes. Please inform patient if ordered.

## 2024-03-15 NOTE — Addendum Note (Signed)
 Addended by: Diontae Route R on: 03/15/2024 04:12 PM   Modules accepted: Orders

## 2024-03-15 NOTE — Telephone Encounter (Signed)
 CMP ordered  - lab visit ok -at [facility] today if she is able to get there before close.   If any new symptoms or weakness needs to be seen immediately.  She should be taking calcium  500 mg daily along with dietary intake per endocrinology note on June 3.  Please make sure she is taking a supplement.

## 2024-03-16 ENCOUNTER — Emergency Department (HOSPITAL_COMMUNITY)
Admission: EM | Admit: 2024-03-16 | Discharge: 2024-03-16 | Disposition: A | Discharge status: Home / Self Care | Attending: Emergency Medicine | Admitting: Emergency Medicine

## 2024-03-16 ENCOUNTER — Encounter (HOSPITAL_COMMUNITY): Payer: Self-pay

## 2024-03-16 ENCOUNTER — Other Ambulatory Visit: Payer: Self-pay

## 2024-03-16 DIAGNOSIS — R200 Anesthesia of skin: Secondary | ICD-10-CM | POA: Diagnosis not present

## 2024-03-16 DIAGNOSIS — Z794 Long term (current) use of insulin: Secondary | ICD-10-CM | POA: Insufficient documentation

## 2024-03-16 DIAGNOSIS — R110 Nausea: Secondary | ICD-10-CM | POA: Diagnosis present

## 2024-03-16 LAB — COMPREHENSIVE METABOLIC PANEL WITH GFR
ALT: 28 U/L (ref 0–44)
AST: 21 U/L (ref 15–41)
Albumin: 4.1 g/dL (ref 3.5–5.0)
Alkaline Phosphatase: 60 U/L (ref 38–126)
Anion gap: 12 (ref 5–15)
BUN: 9 mg/dL (ref 6–20)
CO2: 24 mmol/L (ref 22–32)
Calcium: 9.3 mg/dL (ref 8.9–10.3)
Chloride: 103 mmol/L (ref 98–111)
Creatinine, Ser: 0.75 mg/dL (ref 0.44–1.00)
GFR, Estimated: >60 mL/min (ref >60)
Glucose, Bld: 143 mg/dL — ABNORMAL HIGH (ref 70–99)
Potassium: 4 mmol/L (ref 3.5–5.1)
Sodium: 139 mmol/L (ref 135–145)
Total Bilirubin: 0.4 mg/dL (ref 0.0–1.2)
Total Protein: 7.8 g/dL (ref 6.5–8.1)

## 2024-03-16 LAB — CBC WITH DIFFERENTIAL/PLATELET
Abs Immature Granulocytes: 0.04 10*3/uL (ref 0.00–0.07)
Basophils Absolute: 0 10*3/uL (ref 0.0–0.1)
Basophils Relative: 0 %
Eosinophils Absolute: 0.1 10*3/uL (ref 0.0–0.5)
Eosinophils Relative: 1 %
HCT: 41.4 % (ref 36.0–46.0)
Hemoglobin: 14.1 g/dL (ref 12.0–15.0)
Immature Granulocytes: 0 %
Lymphocytes Relative: 23 %
Lymphs Abs: 2.2 10*3/uL (ref 0.7–4.0)
MCH: 29.7 pg (ref 26.0–34.0)
MCHC: 34.1 g/dL (ref 30.0–36.0)
MCV: 87.2 fL (ref 80.0–100.0)
Monocytes Absolute: 0.5 10*3/uL (ref 0.1–1.0)
Monocytes Relative: 5 %
Neutro Abs: 6.5 10*3/uL (ref 1.7–7.7)
Neutrophils Relative %: 71 %
Platelets: 296 10*3/uL (ref 150–400)
RBC: 4.75 MIL/uL (ref 3.87–5.11)
RDW: 13.8 % (ref 11.5–15.5)
WBC: 9.3 10*3/uL (ref 4.0–10.5)
nRBC: 0 % (ref 0.0–0.2)

## 2024-03-16 MED ORDER — ONDANSETRON 4 MG PO TBDP: ORAL_TABLET | ORAL | 0 refills | Status: AC

## 2024-03-16 NOTE — ED Triage Notes (Signed)
 Pt arrived via POV from home for follow-up on her recurrent symptoms of nausea, and sensations of tingling and numbness. Pt reports having lab work yesterday to check her calcium  level and reports her lab work was normal. Pt presents today concerned for her sodium level.

## 2024-03-16 NOTE — Discharge Instructions (Signed)
 Follow up with your doctor next week

## 2024-03-16 NOTE — ED Provider Notes (Signed)
 [place] EMERGENCY DEPARTMENT AT [facility] Provider Note   CSN: [id number] Arrival date & time: 03/16/24  1849     Patient presents with: Nausea   Heather Fry is a 48 y.o. female.  {Add pertinent medical, surgical, social history, OB history to WGN:56213} Patient states she had some tingling in her arms and face.  She has had low sodium and low calcium  before and was concerned about that.  She also had some nausea today   Weakness      Prior to Admission medications   Medication Sig Start Date End Date Taking? Authorizing Provider  ondansetron  (ZOFRAN -ODT) 4 MG disintegrating tablet 4mg  ODT q4 hours prn nausea/vomit 03/16/24  Yes Timithy Arons, MD  acarbose  (PRECOSE ) 100 MG tablet Take 1 tablet (100 mg total) by mouth 3 (three) times daily with meals. 03/03/24   Thapa, [place], MD  albuterol  (VENTOLIN  HFA) 108 (90 Base) MCG/ACT inhaler Inhale 2 puffs into the lungs every 6 (six) hours as needed. 12/13/19   Joesph Mussel, DO  Ascorbic Acid (VITAMIN C) 1000 MG tablet Take 2,500 mg by mouth daily.    [provider]  cetirizine  (ZYRTEC ) 10 MG tablet Take 10 mg by mouth daily.    [provider]  Cholecalciferol  (VITAMIN D3) 125 MCG (5000 UT) CAPS Take 1 capsule (5,000 Units total) by mouth daily. Patient taking differently: Take 10,000 Units by mouth daily. 10/13/19   Gwyndolyn Lerner, MD  Continuous Glucose Sensor (FREESTYLE LIBRE 3 PLUS SENSOR) MISC Change sensor every 15 days. 03/10/24   Ether Hercules, MD  EPINEPHrine  0.3 mg/0.3 mL IJ SOAJ injection Inject 0.3 mg into the muscle as needed for anaphylaxis. 07/02/22   Rochester Chuck, MD  EQUATE STOOL SOFTENER 100 MG capsule Take 100 mg by mouth 2 (two) times daily as needed for moderate constipation. 08/21/21   [provider]  escitalopram  (LEXAPRO ) 20 MG tablet Take 1 tablet (20 mg total) by mouth at bedtime. 01/26/19   Gwyndolyn Lerner, MD  esomeprazole (NEXIUM) 40 MG capsule Take 1  capsule (40 mg total) by mouth 2 (two) times daily before a meal. 03/12/24   Lanney Pitts, PA-C  fluticasone  (FLONASE ) 50 MCG/ACT nasal spray Place 2 sprays into both nostrils at bedtime as needed for allergies.    [provider]  glimepiride  (AMARYL ) 1 MG tablet Take 1 tablet (1 mg total) by mouth daily as needed (blood glucose above 200). 03/03/24   Thapa, [place], MD  glucose blood (ACCU-CHEK AVIVA PLUS) test strip 1 each by Other route daily. And lancets 1/day 05/30/21   Gwyndolyn Lerner, MD  ipratropium (ATROVENT ) 0.02 % nebulizer solution INHALE 1 VIAL IN NEBULIZER EVERY 6 HOURS AS NEEDED 03/03/23   Rochester Chuck, MD  LANTUS  SOLOSTAR 100 UNIT/ML Solostar Pen Inject 15 Units into the skin daily. 03/03/24   Thapa, [place], MD  levothyroxine  (SYNTHROID ) 150 MCG tablet Take 1 tablet (150 mcg total) by mouth daily. 03/07/24   Ether Hercules, MD  Magnesium  250 MG TABS Take 250 mg by mouth at bedtime.     [provider]  NON FORMULARY at bedtime. CPAP at night    [provider]  rosuvastatin  (CRESTOR ) 10 MG tablet Take 10 mg by mouth every Saturday. 12/05/21   [provider]  Simethicone  250 MG CAPS Take 500 mg by mouth at bedtime.    [provider]  sucralfate  (CARAFATE ) 1 GM/10ML suspension TAKE 10 ML BY MOUTH  4 TIMES  DAILY AS NEEDED 06/09/22   Evander Hills, PA-C    Allergies: Bee venom, Ketorolac  tromethamine , Hydrochlorothiazide, Levofloxacin, Levomenol, Other, Tramadol , Triamcinolone acetonide, Depo-medrol  [methylprednisolone ], Menthol, Budesonide -formoterol  fumarate, Lisinopril, Losartan  potassium, Nystatin, Penicillins, and Wound dressing adhesive    Review of Systems  Neurological:  Positive for weakness.    Updated Vital Signs BP 138/76 (BP Location: Right Arm)   Pulse 74   Temp 98.3 F (36.8 C) (Oral)   Resp 16   Ht 5' 3 (1.6 m)   Wt 122.5 kg   LMP 10/09/2014   SpO2 100%   BMI 47.83 kg/m   Physical Exam  (all labs  ordered are listed, but only abnormal results are displayed) Labs Reviewed  COMPREHENSIVE METABOLIC PANEL WITH GFR - Abnormal; Notable for the following components:      Result Value   Glucose, Bld 143 (*)    All other components within normal limits  CBC WITH DIFFERENTIAL/PLATELET    EKG: None  Radiology: No results found.  {Document cardiac monitor, telemetry assessment procedure when appropriate:32947} Procedures   Medications Ordered in the ED - No data to display    {Click here for ABCD2, HEART and other calculators REFRESH Note before signing:1}                              Medical Decision Making Amount and/or Complexity of Data Reviewed Labs: ordered.  Risk Prescription drug management.   Patient with nausea and numbness in the arms.  Her electrolytes were okay.  She is sent home with Zofran  and will follow-up with PCP  {Document critical care time when appropriate  Document review of labs and clinical decision tools ie CHADS2VASC2, etc  Document your independent review of radiology images and any outside records  Document your discussion with family members, caretakers and with consultants  Document social determinants of health affecting pt's care  Document your decision making why or why not admission, treatments were needed:32947:::1}   Final diagnoses:  Nausea    ED Discharge Orders          Ordered    ondansetron  (ZOFRAN -ODT) 4 MG disintegrating tablet        03/16/24 2113

## 2024-03-16 NOTE — ED Notes (Signed)
Pt denies chest pain, denies SOB

## 2024-03-21 ENCOUNTER — Encounter: Payer: Self-pay | Admitting: Bariatrics

## 2024-03-21 ENCOUNTER — Ambulatory Visit: Admitting: Bariatrics

## 2024-03-21 VITALS — BP 162/99 | HR 80 | Temp 97.9°F | Ht 63.0 in | Wt 266.0 lb

## 2024-03-21 DIAGNOSIS — R030 Elevated blood-pressure reading, without diagnosis of hypertension: Secondary | ICD-10-CM | POA: Diagnosis not present

## 2024-03-21 DIAGNOSIS — Z794 Long term (current) use of insulin: Secondary | ICD-10-CM

## 2024-03-21 DIAGNOSIS — Z1331 Encounter for screening for depression: Secondary | ICD-10-CM | POA: Diagnosis not present

## 2024-03-21 DIAGNOSIS — E559 Vitamin D deficiency, unspecified: Secondary | ICD-10-CM

## 2024-03-21 DIAGNOSIS — Z6841 Body Mass Index (BMI) 40.0 and over, adult: Secondary | ICD-10-CM

## 2024-03-21 DIAGNOSIS — E1165 Type 2 diabetes mellitus with hyperglycemia: Secondary | ICD-10-CM

## 2024-03-21 DIAGNOSIS — R0602 Shortness of breath: Secondary | ICD-10-CM

## 2024-03-21 DIAGNOSIS — R5383 Other fatigue: Secondary | ICD-10-CM

## 2024-03-21 DIAGNOSIS — E785 Hyperlipidemia, unspecified: Secondary | ICD-10-CM

## 2024-03-21 DIAGNOSIS — Z7984 Long term (current) use of oral hypoglycemic drugs: Secondary | ICD-10-CM

## 2024-03-21 DIAGNOSIS — I10 Essential (primary) hypertension: Secondary | ICD-10-CM

## 2024-03-21 NOTE — Progress Notes (Signed)
 At a Glance:  Vitals Temp: 97.9 F (36.6 C) BP: (!) 162/99 Pulse Rate: 80 SpO2: 100 %   Anthropometric Measurements Height: 5' 3 (1.6 m) Weight: 266 lb (120.7 kg) BMI (Calculated): 47.13 Starting Weight: 266lb   Body Composition  Body Fat %: 52.7 % Fat Mass (lbs): 140.2 lbs Muscle Mass (lbs): 119.6 lbs Total Body Water  (lbs): 92.2 lbs Visceral Fat Rating : 18   Other Clinical Data RMR: 2462 Fasting: yes Labs: yes Today's Visit #: 1 Starting Date: 03/21/24    Indirect Calorimeter:   Resting Metabolic Rate ( RMR):  RMR (actual): 2462 kcal RMR (calculated): 1831 kcal The calculated basal metabolic rate is 8168 kcal thus her basal metabolic rate is better than expected.  Plan:   Indirect calorimeter completed, interpreted and reviewed with patient today and allowed to ask questions.  Discussed the implications for the chosen plan and exercise based on the RMR reading.  Will consider repeating the RMR in the future based on weight loss.    Chief Complaint:  Obesity   Subjective:  THEREASA IANNELLO (MR# [id number]) is a 48 y.o. female who presents for evaluation and treatment of obesity and related comorbidities.   Audryana is currently in the action stage of change and ready to dedicate time achieving and maintaining a healthier weight. Taiya is interested in becoming our patient and working on intensive lifestyle modifications including (but not limited to) diet and exercise for weight loss.  Porshe has been struggling with her weight. She has been unsuccessful in either losing weight, maintaining weight loss, or reaching her healthy weight goal.  Marguriete's habits were reviewed today and are as follows: Her family eats meals together, she thinks her family will eat healthier with her, and she started gaining weight with her first baby. .  Current or previous pharmacotherapy: None Tried 1 dose of Ozempic  Response to medication: Had side effects so it was  discontinued  Other Fatigue Millianna admits to daytime somnolence and admits to waking up still tired. Patient has a history of symptoms of daytime fatigue. Emmry generally gets 7 or 8 hours of sleep per night, and states that she has difficulty falling asleep and difficulty falling back asleep if awakened. Snoring is present. Apneic episodes are present. Epworth Sleepiness Score is 8.   Shortness of Breath Jahna notes increasing shortness of breath with exercising and seems to be worsening over time with weight gain. She notes getting out of breath sooner with activity than she used to. This has not gotten worse recently. Lenea denies shortness of breath at rest or orthopnea.  Depression Screen Pieper's Food and Mood (modified PHQ-9) score was 13. 10-14 moderate depression     03/07/2024    8:18 AM  Depression screen PHQ 2/9  Decreased Interest 1  Down, Depressed, Hopeless 0  PHQ - 2 Score 1  Altered sleeping 2  Tired, decreased energy 2  Change in appetite 1  Feeling bad or failure about yourself  0  Trouble concentrating 0  Moving slowly or fidgety/restless 0  Suicidal thoughts 0  PHQ-9 Score 6  Difficult doing work/chores Somewhat difficult     Assessment and Plan:   Other Fatigue Analisa does feel that her weight is causing her energy to be lower than it should be. Fatigue may be related to obesity, depression or many other causes. Labs will be ordered, and in the meanwhile, Nury will focus on self care including making healthy food choices, increasing physical activity and  focusing on stress reduction.  Shortness of Breath Michaila does feel that she gets out of breath more easily that she used to when she exercises. Shaindel's shortness of breath appears to be obesity related and exercise induced. She has agreed to work on weight loss and gradually increase exercise to treat her exercise induced shortness of breath. Will continue to monitor closely.  Health Maintenance:    Obesity   Plan: Will do indirect calorimetry, and labs.  EKG done on 01/08/24.    Vitamin D  Deficiency Vitamin D  is at goal of 50.  Most recent vitamin D  level was 69.30. She is at risk for vitamin D  deficiency due to obesity.  She is on OTC vitamin D3 5000 IU daily. Lab Results  Component Value Date   VD25OH 69.30 08/08/2021   VD25OH 53.74 02/21/2021   VD25OH 66.1 12/21/2020    Plan: Continue OTC vitamin D .  Will check for vitamin D  deficiency.   Type II Diabetes HgbA1c is not at goal. Last A1c was 7.5 CBGs: Fasting wearing a Dexcom, around 100 to 150      Episodes of hypoglycemia: yes - occasionally  Medication(s): Precose , Amaryl  and Lantus  15 units daily.   Lab Results  Component Value Date   HGBA1C 7.5 (A) 03/07/2024   HGBA1C 7.6 (A) 09/02/2023   HGBA1C 7.3 (H) 04/04/2022   Lab Results  Component Value Date   LDLCALC 106 (H) 01/20/2020   CREATININE 0.75 03/16/2024   Lab Results  Component Value Date   GFR 90.22 10/04/2021   GFR 81.57 08/08/2021   GFR 86.49 05/30/2021    Plan: Continue all medications.  Will check her blood sugars on a regular basis. Will wear her glucose monitor.  Will keep all carbohydrates low both sweets and starches.  Will continue exercise regimen to 30 to 60 minutes on most days of the week.  Aim for 7 to 9 hours of sleep nightly.  Eat more low glycemic index foods.    Evelynne had a positive depression screening. Depression is commonly associated with obesity and often results in emotional eating behaviors. We will monitor this closely and work on CBT to help improve the non-hunger eating patterns. Referral to Psychology may be required if no improvement is seen as she continues in our clinic.   Dyslipidemia:  LDL is not at goal. Medication(s): Crestor   Cardiovascular risk factors: diabetes mellitus, dyslipidemia, obesity (BMI >= 30 kg/m2), and sedentary lifestyle  Lab Results  Component Value Date   CHOL 172 04/29/2023    HDL 55 01/20/2020   LDLCALC 106 (H) 01/20/2020   LDLDIRECT 135.0 06/29/2019   TRIG 279 (H) 04/29/2023   CHOLHDL 4.0 01/20/2020   Lab Results  Component Value Date   ALT 28 03/16/2024   AST 21 03/16/2024   ALKPHOS 60 03/16/2024   BILITOT 0.4 03/16/2024   The ASCVD Risk score (Arnett DK, et al., 2019) failed to calculate for the following reasons:   Risk score cannot be calculated because patient has a medical history suggesting prior/existing ASCVD  Plan:  Continue statin.  Information sheet on healthy vs unhealthy fats.  Will avoid all trans fats.  Will read labels Will minimize saturated fats except the following: low fat meats in moderation, diary, and limited dark chocolate.  Increase Omega 3 in foods, and consider an Omega 3 supplement.   Elevated blood pressure reading:   Medication(s): none  BP Readings from Last 3 Encounters:  03/21/24 (!) 162/99  03/16/24 138/76  03/07/24 ROLLEN)  148/84   Lab Results  Component Value Date   CREATININE 0.75 03/16/2024   CREATININE 0.66 01/07/2024   CREATININE 0.63 04/30/2023   Lab Results  Component Value Date   GFR 90.22 10/04/2021   GFR 81.57 08/08/2021   GFR 86.49 05/30/2021    Plan: Continue all antihypertensives at current dosages. No added salt. Will keep sodium content to 1,500 mg or less per day.     Previous labs reviewed today. Date: 03/16/24 BMP, CBC, glucose  Labs done today CMP, Insulin , Vit D, Microalbumin, and TSH   Morbid Obesity: BMI (Calculated): 47.13   Ritta is currently in the action stage of change and her goal is to begin weight loss efforts. I recommend Hazley begin the structured treatment plan as follows:  She has agreed to Category 4 Plan and keeping a food journal and adhering to recommended goals of 1,800 calories and 100 to 120 grams of protein  Exercise goals: All adults should avoid inactivity. Some activity is better than none, and adults who participate in any amount of physical  activity, gain some health benefits.  Behavioral modification strategies:increasing lean protein intake, increasing vegetables, increase H2O intake, keeping healthy foods in the home, better snacking choices, avoiding temptations, and planning for success  She was informed of the importance of frequent follow-up visits to maximize her success with intensive lifestyle modifications for her multiple health conditions. She was informed we would discuss her lab results at her next visit unless there is a critical issue that needs to be addressed sooner. Yuette agreed to keep her next visit at the agreed upon time to discuss these results.  Objective:  General: Cooperative, alert, well developed, in no acute distress. HEENT: Conjunctivae and lids unremarkable. Cardiovascular: Regular rhythm.  Lungs: Normal work of breathing. Neurologic: No focal deficits.   Lab Results  Component Value Date   CREATININE 0.75 03/16/2024   BUN 9 03/16/2024   NA 139 03/16/2024   K 4.0 03/16/2024   CL 103 03/16/2024   CO2 24 03/16/2024   Lab Results  Component Value Date   ALT 28 03/16/2024   AST 21 03/16/2024   ALKPHOS 60 03/16/2024   BILITOT 0.4 03/16/2024   Lab Results  Component Value Date   HGBA1C 7.5 (A) 03/07/2024   HGBA1C 7.6 (A) 09/02/2023   HGBA1C 7.3 (H) 04/04/2022   HGBA1C 8.0 (H) 08/08/2021   HGBA1C 6.6 (H) 12/13/2020   No results found for: INSULIN  Lab Results  Component Value Date   TSH 1.141 12/21/2022   Lab Results  Component Value Date   CHOL 172 04/29/2023   HDL 55 01/20/2020   LDLCALC 106 (H) 01/20/2020   LDLDIRECT 135.0 06/29/2019   TRIG 279 (H) 04/29/2023   CHOLHDL 4.0 01/20/2020   Lab Results  Component Value Date   WBC 9.3 03/16/2024   HGB 14.1 03/16/2024   HCT 41.4 03/16/2024   MCV 87.2 03/16/2024   PLT 296 03/16/2024   Lab Results  Component Value Date   IRON 72 01/20/2020   TIBC 358 01/20/2020   FERRITIN 40 01/20/2020    Attestation  Statements:  Applicable history such as the following:  allergies, medications, problem list, medical history, surgical history, family history, social history, and previous encounter notes reviewed by clinician on day of visit:  Time spent on visit in care of the patient today including the items listed below was 56 minutes.    20 minutes were spent talking about the history, 25 minutes for face  to face counseling implementing the plan, discussing the specifics of how to arrange meals, meal planning, water  intake.   I spent face to face time discussing his/her plan, including breakfast, additional breakfast options, lunch, and dinner options, grocery list, and snacks.  I reviewed her indirect calorimetry. I discussed the implications for the diet plan.    Discussed the bio-impedence test (fat %, muscle mass, and water  weight) and allowed the patient to ask questions.   Discussed the following information sheets: Category 4, Grocery List, 100 Calorie Snacks, 200 Calorie Snacks, Microwave Meals, and protein shakes.   I reviewed the labs which were ordered from her visit on 03/16/2024,   I additionally spent time documenting, reviewing, and checking the codes before submitting.   This may have been prepared with the assistance of Engineer, civil (consulting).  Occasional wrong-word or sound-a-like substitutions may have occurred due to the inherent limitations of voice recognition software.    Clayborne Daring, DO

## 2024-03-21 NOTE — Progress Notes (Signed)
 At a Glance:  Vitals Temp: 97.9 F (36.6 C) BP: (!) 167/106 Pulse Rate: 80 SpO2: 100 %   Anthropometric Measurements Height: 5' 3 (1.6 m) Weight: 266 lb (120.7 kg) BMI (Calculated): 47.13 Starting Weight: 266lb   Body Composition  Body Fat %: 52.7 % Fat Mass (lbs): 140.2 lbs Muscle Mass (lbs): 119.6 lbs Total Body Water  (lbs): 92.2 lbs Visceral Fat Rating : 18   Other Clinical Data Fasting: yes Labs: yes Today's Visit #: 1 Starting Date: 03/21/24    Indirect Calorimeter:   Resting Metabolic Rate ( RMR):  RMR (actual): 2462 kcal RMR (calculated): 1831 kcal The calculated basal metabolic rate is 8168 kcal thus her basal metabolic rate is better than expected.  Plan:   Indirect calorimeter completed, interpreted and reviewed with patient today and allowed to ask questions.  Discussed the implications for the chosen plan and exercise based on the RMR reading.  Will consider repeating the RMR in the future based on weight loss.    Chief Complaint:  Obesity   Subjective:  Heather Fry (MR# [id number]) is a 48 y.o. female who presents for evaluation and treatment of obesity and related comorbidities.   Huong is currently in the action stage of change and ready to dedicate time achieving and maintaining a healthier weight. Loys is interested in becoming our patient and working on intensive lifestyle modifications including (but not limited to) diet and exercise for weight loss.  Etheleen has been struggling with her weight. She has been unsuccessful in either losing weight, maintaining weight loss, or reaching her healthy weight goal.  Vinessa's habits were reviewed today and are as follows: Her family eats meals together, she thinks her family will eat healthier with her, and she started gaining weight with her first baby. .  Current or previous pharmacotherapy: None Tried 1 dose of Ozempic  Response to medication: Had side effects so it was  discontinued  Other Fatigue Kenzleigh admits to daytime somnolence and admits to waking up still tired. Patient has a history of symptoms of daytime fatigue. Cathrine generally gets 7 or 8 hours of sleep per night, and states that she has difficulty falling asleep and difficulty falling back asleep if awakened. Snoring is present. Apneic episodes are present. Epworth Sleepiness Score is 8.   Shortness of Breath Kelsa notes increasing shortness of breath with exercising and seems to be worsening over time with weight gain. She notes getting out of breath sooner with activity than she used to. This has not gotten worse recently. Jyra denies shortness of breath at rest or orthopnea.  Depression Screen Mellisa's Food and Mood (modified PHQ-9) score was 13. 10-14 moderate depression     03/07/2024    8:18 AM  Depression screen PHQ 2/9  Decreased Interest 1  Down, Depressed, Hopeless 0  PHQ - 2 Score 1  Altered sleeping 2  Tired, decreased energy 2  Change in appetite 1  Feeling bad or failure about yourself  0  Trouble concentrating 0  Moving slowly or fidgety/restless 0  Suicidal thoughts 0  PHQ-9 Score 6  Difficult doing work/chores Somewhat difficult     Assessment and Plan:   Other Fatigue Yarisa does feel that her weight is causing her energy to be lower than it should be. Fatigue may be related to obesity, depression or many other causes. Labs will be ordered, and in the meanwhile, Saraann will focus on self care including making healthy food choices, increasing physical activity and focusing on  stress reduction.  Shortness of Breath Arvella does feel that she gets out of breath more easily that she used to when she exercises. Mayan's shortness of breath appears to be obesity related and exercise induced. She has agreed to work on weight loss and gradually increase exercise to treat her exercise induced shortness of breath. Will continue to monitor closely.  Health Maintenance:    Obesity   Plan: Will do indirect calorimetry, and labs.  EKG done on 01/08/24.    Vitamin D  Deficiency Vitamin D  is at goal of 50.  Most recent vitamin D  level was 69.30. She is at risk for vitamin D  deficiency due to obesity.  She is on OTC vitamin D3 5000 IU daily. Lab Results  Component Value Date   VD25OH 69.30 08/08/2021   VD25OH 53.74 02/21/2021   VD25OH 66.1 12/21/2020    Plan: Continue OTC vitamin D .  Will check for vitamin D  deficiency.   Type II Diabetes HgbA1c is not at goal. Last A1c was 7.5 CBGs: Fasting wearing a Dexcom, around 100 to 150      Episodes of hypoglycemia: yes - occasionally  Medication(s): Precose , Amaryl  and Lantus  15 units daily.   Lab Results  Component Value Date   HGBA1C 7.5 (A) 03/07/2024   HGBA1C 7.6 (A) 09/02/2023   HGBA1C 7.3 (H) 04/04/2022   Lab Results  Component Value Date   LDLCALC 106 (H) 01/20/2020   CREATININE 0.75 03/16/2024   Lab Results  Component Value Date   GFR 90.22 10/04/2021   GFR 81.57 08/08/2021   GFR 86.49 05/30/2021    Plan: Continue all medications.  Will check her blood sugars on a regular basis. Will wear her glucose monitor.  Will keep all carbohydrates low both sweets and starches.  Will continue exercise regimen to 30 to 60 minutes on most days of the week.  Aim for 7 to 9 hours of sleep nightly.  Eat more low glycemic index foods.    Leone had a positive depression screening. Depression is commonly associated with obesity and often results in emotional eating behaviors. We will monitor this closely and work on CBT to help improve the non-hunger eating patterns. Referral to Psychology may be required if no improvement is seen as she continues in our clinic.   Dyslipidemia:  LDL is not at goal. Medication(s): Lipitor Cardiovascular risk factors: diabetes mellitus, dyslipidemia, obesity (BMI >= 30 kg/m2), and sedentary lifestyle  Lab Results  Component Value Date   CHOL 172 04/29/2023    HDL 55 01/20/2020   LDLCALC 106 (H) 01/20/2020   LDLDIRECT 135.0 06/29/2019   TRIG 279 (H) 04/29/2023   CHOLHDL 4.0 01/20/2020   Lab Results  Component Value Date   ALT 28 03/16/2024   AST 21 03/16/2024   ALKPHOS 60 03/16/2024   BILITOT 0.4 03/16/2024   The ASCVD Risk score (Arnett DK, et al., 2019) failed to calculate for the following reasons:   Risk score cannot be calculated because patient has a medical history suggesting prior/existing ASCVD  Plan:  Continue statin.  Information sheet on healthy vs unhealthy fats.  Will avoid all trans fats.  Will read labels Will minimize saturated fats except the following: low fat meats in moderation, diary, and limited dark chocolate.  Increase Omega 3 in foods, and consider an Omega 3 supplement.   Elevated blood pressure reading:   Medication(s): none  BP Readings from Last 3 Encounters:  03/21/24 (!) 167/106  03/16/24 138/76  03/07/24 (!) 148/84  Lab Results  Component Value Date   CREATININE 0.75 03/16/2024   CREATININE 0.66 01/07/2024   CREATININE 0.63 04/30/2023   Lab Results  Component Value Date   GFR 90.22 10/04/2021   GFR 81.57 08/08/2021   GFR 86.49 05/30/2021    Plan: Continue all antihypertensives at current dosages. No added salt. Will keep sodium content to 1,500 mg or less per day.     Previous labs reviewed today. Date: 06/ CMP and CBC and glucose  Labs done today CMP, Insulin , HgbA1c, Vit D, Microalbumin, and TSH   Morbid Obesity: BMI (Calculated): 47.13   Caramia is currently in the action stage of change and her goal is to begin weight loss efforts. I recommend Rosette begin the structured treatment plan as follows:  She has agreed to keeping a food journal and adhering to recommended goals of 1,800 calories and 120 to 140 protein  Exercise goals: All adults should avoid inactivity. Some activity is better than none, and adults who participate in any amount of physical activity, gain some  health benefits.  Behavioral modification strategies:increasing lean protein intake, decreasing simple carbohydrates, increase H2O intake, decrease liquid calories, increase high fiber foods, no skipping meals, better snacking choices, avoiding temptations, and planning for success  She was informed of the importance of frequent follow-up visits to maximize her success with intensive lifestyle modifications for her multiple health conditions. She was informed we would discuss her lab results at her next visit unless there is a critical issue that needs to be addressed sooner. Kaslyn agreed to keep her next visit at the agreed upon time to discuss these results.  Objective:  General: Cooperative, alert, well developed, in no acute distress. HEENT: Conjunctivae and lids unremarkable. Cardiovascular: Regular rhythm.  Lungs: Normal work of breathing. Neurologic: No focal deficits.   Lab Results  Component Value Date   CREATININE 0.75 03/16/2024   BUN 9 03/16/2024   NA 139 03/16/2024   K 4.0 03/16/2024   CL 103 03/16/2024   CO2 24 03/16/2024   Lab Results  Component Value Date   ALT 28 03/16/2024   AST 21 03/16/2024   ALKPHOS 60 03/16/2024   BILITOT 0.4 03/16/2024   Lab Results  Component Value Date   HGBA1C 7.5 (A) 03/07/2024   HGBA1C 7.6 (A) 09/02/2023   HGBA1C 7.3 (H) 04/04/2022   HGBA1C 8.0 (H) 08/08/2021   HGBA1C 6.6 (H) 12/13/2020   No results found for: INSULIN  Lab Results  Component Value Date   TSH 1.141 12/21/2022   Lab Results  Component Value Date   CHOL 172 04/29/2023   HDL 55 01/20/2020   LDLCALC 106 (H) 01/20/2020   LDLDIRECT 135.0 06/29/2019   TRIG 279 (H) 04/29/2023   CHOLHDL 4.0 01/20/2020   Lab Results  Component Value Date   WBC 9.3 03/16/2024   HGB 14.1 03/16/2024   HCT 41.4 03/16/2024   MCV 87.2 03/16/2024   PLT 296 03/16/2024   Lab Results  Component Value Date   IRON 72 01/20/2020   TIBC 358 01/20/2020   FERRITIN 40 01/20/2020     Attestation Statements:  Applicable history such as the following:  allergies, medications, problem list, medical history, surgical history, family history, social history, and previous encounter notes reviewed by clinician on day of visit:  Time spent on visit in care of the patient today including the items listed below was 60 minutes.    20 minutes were spent talking about the history, 25 minutes for face to  face counseling implementing the plan, discussing the specifics of how to arrange meals, meal planning, water  intake.   I spent face to face time discussing his/her plan, including breakfast, additional breakfast options, lunch, and dinner options, grocery list, and snacks.  I reviewed her indirect calorimetry. I discussed the implications for the diet plan.    Discussed the bio-impedence test (fat %, muscle mass, and water  weight) and allowed the patient to ask questions.   Discussed the following information sheets: Category 4, Grocery List, 100 Calorie Snacks, 200 Calorie Snacks, Microwave Meals, and protein shakes. .   I reviewed the labs which were ordered from her visit on 03/16/24,   I additionally spent time documenting, reviewing, and checking the codes before submitting.   This may have been prepared with the assistance of Engineer, civil (consulting).  Occasional wrong-word or sound-a-like substitutions may have occurred due to the inherent limitations of voice recognition software.    Clayborne Daring, DO

## 2024-03-22 LAB — COMPREHENSIVE METABOLIC PANEL WITH GFR
ALT: 19 IU/L (ref 0–32)
AST: 15 IU/L (ref 0–40)
Albumin: 4.4 g/dL (ref 3.9–4.9)
Alkaline Phosphatase: 78 IU/L (ref 44–121)
BUN/Creatinine Ratio: 10 (ref 9–23)
BUN: 9 mg/dL (ref 6–24)
Bilirubin Total: 0.3 mg/dL (ref 0.0–1.2)
CO2: 20 mmol/L (ref 20–29)
Calcium: 9.8 mg/dL (ref 8.7–10.2)
Chloride: 99 mmol/L (ref 96–106)
Creatinine, Ser: 0.89 mg/dL (ref 0.57–1.00)
Globulin, Total: 2.8 g/dL (ref 1.5–4.5)
Glucose: 185 mg/dL — ABNORMAL HIGH (ref 70–99)
Potassium: 4.1 mmol/L (ref 3.5–5.2)
Sodium: 138 mmol/L (ref 134–144)
Total Protein: 7.2 g/dL (ref 6.0–8.5)
eGFR: 80 mL/min/{1.73_m2} (ref >59)

## 2024-03-22 LAB — TSH+T4F+T3FREE
Free T4: 1.29 ng/dL (ref 0.82–1.77)
T3, Free: 2.5 pg/mL (ref 2.0–4.4)
TSH: 3.55 u[IU]/mL (ref 0.450–4.500)

## 2024-03-22 LAB — MICROALBUMIN / CREATININE URINE RATIO
Creatinine, Urine: 67.5 mg/dL
Microalb/Creat Ratio: 16 mg/g{creat} (ref 0–29)
Microalbumin, Urine: 10.9 ug/mL

## 2024-03-22 LAB — VITAMIN D 25 HYDROXY (VIT D DEFICIENCY, FRACTURES): Vit D, 25-Hydroxy: 72.1 ng/mL (ref 30.0–100.0)

## 2024-03-22 LAB — INSULIN, RANDOM: INSULIN: 55.1 u[IU]/mL — ABNORMAL HIGH (ref 2.6–24.9)

## 2024-03-24 ENCOUNTER — Encounter: Payer: Self-pay | Admitting: Student in an Organized Health Care Education/Training Program

## 2024-03-24 ENCOUNTER — Ambulatory Visit (INDEPENDENT_AMBULATORY_CARE_PROVIDER_SITE_OTHER): Admitting: Student in an Organized Health Care Education/Training Program

## 2024-03-24 VITALS — BP 149/84 | HR 92 | Wt 272.0 lb

## 2024-03-24 DIAGNOSIS — B9689 Other specified bacterial agents as the cause of diseases classified elsewhere: Secondary | ICD-10-CM | POA: Diagnosis not present

## 2024-03-24 DIAGNOSIS — J019 Acute sinusitis, unspecified: Secondary | ICD-10-CM

## 2024-03-24 MED ORDER — AZITHROMYCIN 250 MG PO TABS: ORAL_TABLET | ORAL | 0 refills | Status: AC

## 2024-03-24 NOTE — Progress Notes (Signed)
   Acute Office Visit  Subjective:     Patient ID: Heather Fry, female    DOB: 1976/07/18, 48 y.o.   MRN: [id number]  Chief Complaint  Patient presents with   Ear Pain    Ear pain,headaches,sore throat, congestion. Started this past Friday. No over the ocunter medications      HPI  48 year old person living with diabetes and obesity here for concerns of acute sinusitis.  Patient reports that she has been feeling more fatigued and rundown over the last week.  Symptoms have been progressive.  She has had increased pressure and discomfort at the back of her eyes, into her forehead, and at her maxillary sinuses.  She has had copious clear nasal drainage.  She has had sore throats and increasing nonproductive cough.  She has had an increase sensation of fullness in both ears, but no ear pain.  She does have asthma, but denies shortness of breath.  No fevers or chills.  Denies systemic symptoms.  Denies nausea or vomiting, eating and drinking well.  Struggling with the increased heat lately.  Her daughter has also had similar symptoms.  Patient reports that she has similar type of sinus infection symptoms a couple times a year.  She not able to tolerate penicillin because of hives.  She has tried cephalosporins in the past but had significant GI upset.  She has tolerated azithromycin well in the past and is found pretty effective for her sinus infections.      Objective:    BP (!) 149/84   Pulse 92   Wt 272 lb (123.4 kg)   LMP 10/09/2014   SpO2 100%   BMI 48.18 kg/m    Physical Exam  Gen: Well-appearing Ears: Bilateral tympanic membranes are mildly bulging, there is a normal light reflex, there is no erythema or effusions, but there is also a loss of normal landmarks Mouth: Mildly erythematous posterior oropharynx, no exudate Neck: No cervical lymphadenopathy or tender swelling in the submandibular space Heart: Regular, no murmur Lungs: Unlabored, clear to auscultation throughout,  no crackles or wheezing      Assessment & Plan:   Problem List Items Addressed This Visit       Unprioritized   Acute bacterial sinusitis - Primary   Acute problem.  Symptoms today are consistent with a upper respiratory tract infection complicated by acute sinusitis.  There is risk of bacterial sinusitis here given the significant discomfort that she has in the frontal sinuses.  We talked about options of supportive care and the option for antibiotics.  Because of her allergies and poor tolerance to cephalosporins, I think azithromycin would be well-tolerated.  We talked about uncertain efficacy of that medicine for bacterial sinusitis due to rising resistance.  Because she has had such benefit with it in the past we decided to try it again.  She is at risk for complications due to diabetes, obesity, and asthma.      Relevant Medications   azithromycin (ZITHROMAX) 250 MG tablet    Meds ordered this encounter  Medications   azithromycin (ZITHROMAX) 250 MG tablet    Sig: Take 2 tablets on day 1, then 1 tablet daily on days 2 through 5    Dispense:  6 tablet    Refill:  0    Return if symptoms worsen or fail to improve.  Cleatus Debby Specking, MD

## 2024-03-24 NOTE — Assessment & Plan Note (Signed)
 Acute problem.  Symptoms today are consistent with a upper respiratory tract infection complicated by acute sinusitis.  There is risk of bacterial sinusitis here given the significant discomfort that she has in the frontal sinuses.  We talked about options of supportive care and the option for antibiotics.  Because of her allergies and poor tolerance to cephalosporins, I think azithromycin would be well-tolerated.  We talked about uncertain efficacy of that medicine for bacterial sinusitis due to rising resistance.  Because she has had such benefit with it in the past we decided to try it again.  She is at risk for complications due to diabetes, obesity, and asthma.

## 2024-03-26 ENCOUNTER — Telehealth: Admitting: Nurse Practitioner

## 2024-03-26 DIAGNOSIS — F32A Depression, unspecified: Secondary | ICD-10-CM | POA: Diagnosis not present

## 2024-03-26 NOTE — Patient Instructions (Signed)
 Heather Fry, thank you for joining Haze LELON Servant, NP for today's virtual visit.  While this provider is not your primary care provider (PCP), if your PCP is located in our provider database this encounter information will be shared with them immediately following your visit.   A [facility] MyChart account gives you access to today's visit and all your visits, tests, and labs performed at [facility]  click here if you don't have a [facility] MyChart account or go to [email or web address]  Consent: (Patient) Heather Fry provided verbal consent for this virtual visit at the beginning of the encounter.  Current Medications:  Current Outpatient Medications:    acarbose  (PRECOSE ) 100 MG tablet, Take 1 tablet (100 mg total) by mouth 3 (three) times daily with meals., Disp: 270 tablet, Rfl: 3   albuterol  (VENTOLIN  HFA) 108 (90 Base) MCG/ACT inhaler, Inhale 2 puffs into the lungs every 6 (six) hours as needed., Disp: 18 g, Rfl: 11   Ascorbic Acid (VITAMIN C) 1000 MG tablet, Take 2,500 mg by mouth daily., Disp: , Rfl:    azithromycin  (ZITHROMAX ) 250 MG tablet, Take 2 tablets on day 1, then 1 tablet daily on days 2 through 5, Disp: 6 tablet, Rfl: 0   cetirizine  (ZYRTEC ) 10 MG tablet, Take 10 mg by mouth daily., Disp: , Rfl:    Cholecalciferol  (VITAMIN D3) 125 MCG (5000 UT) CAPS, Take 1 capsule (5,000 Units total) by mouth daily. (Patient taking differently: Take 10,000 Units by mouth daily.), Disp: 30 capsule, Rfl: 11   Continuous Glucose Sensor (FREESTYLE LIBRE 3 PLUS SENSOR) MISC, Change sensor every 15 days., Disp: 2 each, Rfl: 1   EPINEPHrine  0.3 mg/0.3 mL IJ SOAJ injection, Inject 0.3 mg into the muscle as needed for anaphylaxis., Disp: 2 each, Rfl: 1   EQUATE STOOL SOFTENER 100 MG capsule, Take 100 mg by mouth 2 (two) times daily as needed for moderate constipation., Disp: , Rfl:    escitalopram  (LEXAPRO ) 20 MG tablet, Take 1 tablet (20 mg total) by mouth at bedtime.,  Disp: 30 tablet, Rfl: 5   esomeprazole  (NEXIUM ) 40 MG capsule, Take 1 capsule (40 mg total) by mouth 2 (two) times daily before a meal., Disp: 180 capsule, Rfl: 1   fluticasone  (FLONASE ) 50 MCG/ACT nasal spray, Place 2 sprays into both nostrils at bedtime as needed for allergies., Disp: , Rfl:    glimepiride  (AMARYL ) 1 MG tablet, Take 1 tablet (1 mg total) by mouth daily as needed (blood glucose above 200)., Disp: 270 tablet, Rfl: 3   glucose blood (ACCU-CHEK AVIVA PLUS) test strip, 1 each by Other route daily. And lancets 1/day, Disp: 100 each, Rfl: 3   ipratropium (ATROVENT ) 0.02 % nebulizer solution, INHALE 1 VIAL IN NEBULIZER EVERY 6 HOURS AS NEEDED, Disp: 180 mL, Rfl: 0   LANTUS  SOLOSTAR 100 UNIT/ML Solostar Pen, Inject 15 Units into the skin daily., Disp: 15 mL, Rfl: 4   levothyroxine  (SYNTHROID ) 150 MCG tablet, Take 1 tablet (150 mcg total) by mouth daily., Disp: , Rfl:    Magnesium  250 MG TABS, Take 250 mg by mouth at bedtime. , Disp: , Rfl:    NON FORMULARY, at bedtime. CPAP at night, Disp: , Rfl:    ondansetron  (ZOFRAN -ODT) 4 MG disintegrating tablet, 4mg  ODT q4 hours prn nausea/vomit, Disp: 10 tablet, Rfl: 0   rosuvastatin  (CRESTOR ) 10 MG tablet, Take 10 mg by mouth every Saturday., Disp: , Rfl:    Simethicone  250 MG CAPS, Take 500 mg by mouth  at bedtime., Disp: , Rfl:    sucralfate  (CARAFATE ) 1 GM/10ML suspension, TAKE 10 ML BY MOUTH  4 TIMES DAILY AS NEEDED, Disp: 828 mL, Rfl: 0   Medications ordered in this encounter:  No orders of the defined types were placed in this encounter.    *If you need refills on other medications prior to your next appointment, please contact your pharmacy*  Follow-Up: Call back or seek an in-person evaluation if the symptoms worsen or if the condition fails to improve as anticipated.  [facility] [phone]  Other Instructions May pick up lexapro  from pharmacy.    If you have been instructed to have an in-person evaluation  today at a local Urgent Care facility, please use the link below. It will take you to a list of all of our available [facility], including address, phone number and hours of operation. Please do not delay care.  [facility]  If you or a family member do not have a primary care provider, use the link below to schedule a visit and establish care. When you choose a [facility] primary care physician or advanced practice provider, you gain a long-term partner in health. Find a Primary Care Provider  Learn more about [facility]'s in-office and virtual care options: [facility] - Get Care Now

## 2024-03-26 NOTE — Progress Notes (Signed)
 Virtual Visit Consent   SAMEERAH Heather Heather Fry, you are scheduled for a virtual visit with a [facility] provider today. Just as with appointments in the office, your consent must be obtained to participate. Your consent will be active for this visit and any virtual visit you may have with one of our providers in the next 365 days. If you have a MyChart account, a copy of this consent can be sent to you electronically.  As this is a virtual visit, video technology does not allow for your provider to perform a traditional examination. This may limit your provider's ability to fully assess your condition. If your provider identifies any concerns that need to be evaluated in person or the need to arrange testing (such as labs, EKG, etc.), we will make arrangements to do so. Although advances in technology are sophisticated, we cannot ensure that it will always work on either your end or our end. If the connection with a video visit is poor, the visit may have to be switched to a telephone visit. With either a video or telephone visit, we are not always able to ensure that we have a secure connection.  By engaging in this virtual visit, you consent to the provision of healthcare and authorize for your insurance to be billed (if applicable) for the services provided during this visit. Depending on your insurance coverage, you may receive a charge related to this service.  I need to obtain your verbal consent now. Are you willing to proceed with your visit today? NIMRAT Heather Heather Fry has provided verbal consent on 03/26/2024 for a virtual visit (video or telephone). Haze LELON Servant, NP  Date: 03/26/2024 4:28 PM   Virtual Visit via Video Note   I, Haze LELON Servant, connected with  ZENOLA Heather Heather Fry  ([id number], 07/03/1976) on 03/26/24 at  4:15 PM EDT by a video-enabled telemedicine application and verified that I am speaking with the correct person using two identifiers.  Location: Patient: Virtual Visit Location  Patient: Home Provider: Virtual Visit Location Provider: Home Office   I discussed the limitations of evaluation and management by telemedicine and the availability of in person appointments. The patient expressed understanding and agreed to proceed.    History of Present Illness: Heather Heather Fry is a 48 y.o. who identifies as a female who was assigned female at birth, and is being seen today for refill of SSRI.  Ms. Heather Heather Fry states she ran out of lexapro  and is needed a refill. I was able to contact the pharmacy and they have a prescription from 01-29-2024 on file for the patient and will get that ready for her to pick up. I called Mrs. Heather Heather Fry back and did let her know lexapro  is being filled fo her.     Problems:  Patient Active Problem List   Diagnosis Date Noted   Acute bacterial sinusitis 03/24/2024   Bilateral primary osteoarthritis of knee 03/07/2024   Moderate persistent asthma, uncomplicated 06/08/2022   Seasonal and perennial allergic rhinitis 06/08/2022   Multiple drug allergies 06/08/2022   Type 2 diabetes mellitus with hyperglycemia (HCC) 04/04/2022   Psychophysiologic insomnia 01/11/2022   Dyslipidemia 10/15/2021   Essential hypertension 10/15/2021   Hypocalcemia 08/08/2021   Disc disease, degenerative, cervical 04/19/2021   Right carpal tunnel syndrome 04/19/2021   Irritable bowel syndrome 04/17/2021   Postoperative hypothyroidism 07/29/2020   Idiopathic peripheral neuropathy 04/18/2020   OSA (obstructive sleep apnea) 04/18/2020   RLS (restless legs syndrome) 04/18/2020   Obesity, Class III, BMI  40-49.9 (morbid obesity) 01/20/2020   Vitamin B 12 deficiency 07/28/2019   Hypomagnesemia 03/29/2019   Perimenopausal symptoms 11/30/2018   Vitamin D  deficiency 10/06/2018   Gastroesophageal reflux disease 01/28/2017   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   Gastric polyp    Depression     Allergies:  Allergies  Allergen Reactions   Bee Venom Shortness Of Breath, Swelling and  Palpitations   Ketorolac  Tromethamine  Anaphylaxis, Hives and Other (See Comments)   Hydrochlorothiazide Other (See Comments)    Dehydration  Dehydration, Dehydration   Levofloxacin Other (See Comments)    Pt can not have due to taking Lexapro    Levomenol Other (See Comments)   Other Other (See Comments)   Tramadol  Hives   Triamcinolone Acetonide Other (See Comments)    Elevated blood pressure, caused diabetes, changed tyroid electrolytes Patient has no tyroid     Depo-Medrol  [Methylprednisolone ] Hypertension    Pt states high BP, high glucose and dizzy   Menthol Other (See Comments)   Budesonide -Formoterol  Fumarate Rash   Lisinopril Nausea Only and Other (See Comments)   Losartan  Potassium Nausea Only   Nystatin Rash    oral   Penicillins Other (See Comments)    Loopy, childhood allergy   ALL CILLINS  Unknown childhood reactions Has patient had a PCN reaction causing anaphylaxis, immediate rash, facial/tongue/throat swelling, SOB or lightheadedness with hypotension? no, Has patient had a PCN reaction causing severe rash involving mucus membranes or skin necrosis?  no, Has patient had a PCN reaction that required hospitilization?no, , Has patient had a PCN reaction occurring within the last 10 years?  no, If all of the above answers are no then may proceed with cephalosporin use.   Wound Dressing Adhesive Rash   Medications:  Current Outpatient Medications:    acarbose  (PRECOSE ) 100 MG tablet, Take 1 tablet (100 mg total) by mouth 3 (three) times daily with meals., Disp: 270 tablet, Rfl: 3   albuterol  (VENTOLIN  HFA) 108 (90 Base) MCG/ACT inhaler, Inhale 2 puffs into the lungs every 6 (six) hours as needed., Disp: 18 g, Rfl: 11   Ascorbic Acid (VITAMIN C) 1000 MG tablet, Take 2,500 mg by mouth daily., Disp: , Rfl:    azithromycin  (ZITHROMAX ) 250 MG tablet, Take 2 tablets on day 1, then 1 tablet daily on days 2 through 5, Disp: 6 tablet, Rfl: 0   cetirizine  (ZYRTEC ) 10 MG tablet,  Take 10 mg by mouth daily., Disp: , Rfl:    Cholecalciferol  (VITAMIN D3) 125 MCG (5000 UT) CAPS, Take 1 capsule (5,000 Units total) by mouth daily. (Patient taking differently: Take 10,000 Units by mouth daily.), Disp: 30 capsule, Rfl: 11   Continuous Glucose Sensor (FREESTYLE LIBRE 3 PLUS SENSOR) MISC, Change sensor every 15 days., Disp: 2 each, Rfl: 1   EPINEPHrine  0.3 mg/0.3 mL IJ SOAJ injection, Inject 0.3 mg into the muscle as needed for anaphylaxis., Disp: 2 each, Rfl: 1   EQUATE STOOL SOFTENER 100 MG capsule, Take 100 mg by mouth 2 (two) times daily as needed for moderate constipation., Disp: , Rfl:    escitalopram  (LEXAPRO ) 20 MG tablet, Take 1 tablet (20 mg total) by mouth at bedtime., Disp: 30 tablet, Rfl: 5   esomeprazole  (NEXIUM ) 40 MG capsule, Take 1 capsule (40 mg total) by mouth 2 (two) times daily before a meal., Disp: 180 capsule, Rfl: 1   fluticasone  (FLONASE ) 50 MCG/ACT nasal spray, Place 2 sprays into both nostrils at bedtime as needed for allergies., Disp: , Rfl:    glimepiride  (AMARYL )  1 MG tablet, Take 1 tablet (1 mg total) by mouth daily as needed (blood glucose above 200)., Disp: 270 tablet, Rfl: 3   glucose blood (ACCU-CHEK AVIVA PLUS) test strip, 1 each by Other route daily. And lancets 1/day, Disp: 100 each, Rfl: 3   ipratropium (ATROVENT ) 0.02 % nebulizer solution, INHALE 1 VIAL IN NEBULIZER EVERY 6 HOURS AS NEEDED, Disp: 180 mL, Rfl: 0   LANTUS  SOLOSTAR 100 UNIT/ML Solostar Pen, Inject 15 Units into the skin daily., Disp: 15 mL, Rfl: 4   levothyroxine  (SYNTHROID ) 150 MCG tablet, Take 1 tablet (150 mcg total) by mouth daily., Disp: , Rfl:    Magnesium  250 MG TABS, Take 250 mg by mouth at bedtime. , Disp: , Rfl:    NON FORMULARY, at bedtime. CPAP at night, Disp: , Rfl:    ondansetron  (ZOFRAN -ODT) 4 MG disintegrating tablet, 4mg  ODT q4 hours prn nausea/vomit, Disp: 10 tablet, Rfl: 0   rosuvastatin  (CRESTOR ) 10 MG tablet, Take 10 mg by mouth every Saturday., Disp: , Rfl:     Simethicone  250 MG CAPS, Take 500 mg by mouth at bedtime., Disp: , Rfl:    sucralfate  (CARAFATE ) 1 GM/10ML suspension, TAKE 10 ML BY MOUTH  4 TIMES DAILY AS NEEDED, Disp: 828 mL, Rfl: 0  Observations/Objective: Patient is well-developed, well-nourished in no acute distress.  Resting comfortably at home.  Head is normocephalic, atraumatic.  No labored breathing.  Speech is clear and coherent with logical content.  Patient is alert and oriented at baseline.    Assessment and Plan: 1. Depression, unspecified depression type (Primary) May pick up lexapro  from pharmacy.   Follow Up Instructions: I discussed the assessment and treatment plan with the patient. The patient was provided an opportunity to ask questions and all were answered. The patient agreed with the plan and demonstrated an understanding of the instructions.  A copy of instructions were sent to the patient via MyChart unless otherwise noted below.    The patient was advised to call back or seek an in-person evaluation if the symptoms worsen or if the condition fails to improve as anticipated.    Yaquelin Langelier W Abilene Mcphee, NP

## 2024-03-27 ENCOUNTER — Other Ambulatory Visit: Payer: Self-pay

## 2024-03-27 ENCOUNTER — Encounter (HOSPITAL_COMMUNITY): Payer: Self-pay | Admitting: Emergency Medicine

## 2024-03-27 ENCOUNTER — Emergency Department (HOSPITAL_COMMUNITY)
Admission: EM | Admit: 2024-03-27 | Discharge: 2024-03-28 | Disposition: A | Discharge status: Home / Self Care | Attending: Emergency Medicine | Admitting: Emergency Medicine

## 2024-03-27 ENCOUNTER — Emergency Department (HOSPITAL_COMMUNITY)

## 2024-03-27 DIAGNOSIS — K219 Gastro-esophageal reflux disease without esophagitis: Secondary | ICD-10-CM | POA: Insufficient documentation

## 2024-03-27 DIAGNOSIS — E119 Type 2 diabetes mellitus without complications: Secondary | ICD-10-CM | POA: Insufficient documentation

## 2024-03-27 DIAGNOSIS — Z7984 Long term (current) use of oral hypoglycemic drugs: Secondary | ICD-10-CM | POA: Diagnosis not present

## 2024-03-27 DIAGNOSIS — Z794 Long term (current) use of insulin: Secondary | ICD-10-CM | POA: Diagnosis not present

## 2024-03-27 DIAGNOSIS — J029 Acute pharyngitis, unspecified: Secondary | ICD-10-CM | POA: Diagnosis not present

## 2024-03-27 DIAGNOSIS — J45909 Unspecified asthma, uncomplicated: Secondary | ICD-10-CM | POA: Insufficient documentation

## 2024-03-27 DIAGNOSIS — Z79899 Other long term (current) drug therapy: Secondary | ICD-10-CM | POA: Diagnosis not present

## 2024-03-27 DIAGNOSIS — Z7951 Long term (current) use of inhaled steroids: Secondary | ICD-10-CM | POA: Insufficient documentation

## 2024-03-27 DIAGNOSIS — E039 Hypothyroidism, unspecified: Secondary | ICD-10-CM | POA: Diagnosis not present

## 2024-03-27 DIAGNOSIS — R0989 Other specified symptoms and signs involving the circulatory and respiratory systems: Secondary | ICD-10-CM | POA: Diagnosis present

## 2024-03-27 LAB — CBC WITH DIFFERENTIAL/PLATELET
Abs Immature Granulocytes: 0.04 10*3/uL (ref 0.00–0.07)
Basophils Absolute: 0 10*3/uL (ref 0.0–0.1)
Basophils Relative: 0 %
Eosinophils Absolute: 0.1 10*3/uL (ref 0.0–0.5)
Eosinophils Relative: 1 %
HCT: 38.9 % (ref 36.0–46.0)
Hemoglobin: 13.2 g/dL (ref 12.0–15.0)
Immature Granulocytes: 1 %
Lymphocytes Relative: 21 %
Lymphs Abs: 1.9 10*3/uL (ref 0.7–4.0)
MCH: 29.6 pg (ref 26.0–34.0)
MCHC: 33.9 g/dL (ref 30.0–36.0)
MCV: 87.2 fL (ref 80.0–100.0)
Monocytes Absolute: 0.5 10*3/uL (ref 0.1–1.0)
Monocytes Relative: 5 %
Neutro Abs: 6.3 10*3/uL (ref 1.7–7.7)
Neutrophils Relative %: 72 %
Platelets: 255 10*3/uL (ref 150–400)
RBC: 4.46 MIL/uL (ref 3.87–5.11)
RDW: 13.7 % (ref 11.5–15.5)
WBC: 8.9 10*3/uL (ref 4.0–10.5)
nRBC: 0 % (ref 0.0–0.2)

## 2024-03-27 LAB — TROPONIN I (HIGH SENSITIVITY): Troponin I (High Sensitivity): 3 ng/L (ref <18)

## 2024-03-27 LAB — BASIC METABOLIC PANEL WITH GFR
Anion gap: 14 (ref 5–15)
BUN: 15 mg/dL (ref 6–20)
CO2: 22 mmol/L (ref 22–32)
Calcium: 9.3 mg/dL (ref 8.9–10.3)
Chloride: 103 mmol/L (ref 98–111)
Creatinine, Ser: 0.74 mg/dL (ref 0.44–1.00)
GFR, Estimated: >60 mL/min (ref >60)
Glucose, Bld: 202 mg/dL — ABNORMAL HIGH (ref 70–99)
Potassium: 3.7 mmol/L (ref 3.5–5.1)
Sodium: 139 mmol/L (ref 135–145)

## 2024-03-27 LAB — RESP PANEL BY RT-PCR (RSV, FLU A&B, COVID)  RVPGX2
Influenza A by PCR: NEGATIVE
Influenza B by PCR: NEGATIVE
Resp Syncytial Virus by PCR: NEGATIVE
SARS Coronavirus 2 by RT PCR: NEGATIVE

## 2024-03-27 LAB — GROUP A STREP BY PCR: Group A Strep by PCR: NOT DETECTED

## 2024-03-27 MED ORDER — ALUM & MAG HYDROXIDE-SIMETH 200-200-20 MG/5ML PO SUSP
30.0000 mL | Freq: Once | ORAL | Status: AC
  Administered 2024-03-28: 30 mL via ORAL
  Filled 2024-03-27: qty 30

## 2024-03-27 NOTE — ED Provider Notes (Signed)
 [place] EMERGENCY DEPARTMENT AT [facility] Provider Note   CSN: [id number] Arrival date & time: 03/27/24  2225     Patient presents with: Sore Throat and chest congestion   MARLISA Fry is a 48 y.o. female.  {Add pertinent medical, surgical, social history, OB history to YEP:67052} The history is provided by the patient.   Patient with extensive history including depression, diabetes, obesity presents with multiple complaints.  Patient reports over a week ago she began having mild sore throat and chest congestion and ear pressure.  Her PCP started her on a Z-Pak but she has not had any improvement.  She reports the sore throat seems to be related to her GERD.  She has tried multiple medications including a PPI and Carafate  without much relief.  No chest pain.  She does have burning from her upper abdomen into her throat.  No fevers or vomiting. At times she reports her upper abdomen feels numb but no pain. She has had previous cholecystectomy   Past Medical History:  Diagnosis Date   Allergic asthma    Anemia    Anxiety    Arthritis    Constipation    Depression    Diabetes mellitus without complication (HCC)    Diverticulosis    Fatty liver    moderate to severe   Fatty liver    GERD (gastroesophageal reflux disease)    Graves disease    Headache    tension, ocular migraines   High cholesterol    Hypothyroidism    Internal hemorrhoids    Osteoarthritis    Prolonged Q-T interval on ECG    Sleep apnea    Stomach ulcer    Thyroid  disease    Uterine fibroid    Vitamin D  deficiency     Prior to Admission medications   Medication Sig Start Date End Date Taking? Authorizing Provider  acarbose  (PRECOSE ) 100 MG tablet Take 1 tablet (100 mg total) by mouth 3 (three) times daily with meals. 03/03/24   Thapa, [place], MD  albuterol  (VENTOLIN  HFA) 108 (90 Base) MCG/ACT inhaler Inhale 2 puffs into the lungs every 6 (six) hours as needed. 12/13/19   Gretta Leita SQUIBB,  DO  Ascorbic Acid (VITAMIN C) 1000 MG tablet Take 2,500 mg by mouth daily.    [provider]  azithromycin  (ZITHROMAX ) 250 MG tablet Take 2 tablets on day 1, then 1 tablet daily on days 2 through 5 03/24/24 03/29/24  Jerrell Cleatus Ned, MD  cetirizine  (ZYRTEC ) 10 MG tablet Take 10 mg by mouth daily.    [provider]  Cholecalciferol  (VITAMIN D3) 125 MCG (5000 UT) CAPS Take 1 capsule (5,000 Units total) by mouth daily. Patient taking differently: Take 10,000 Units by mouth daily. 10/13/19   Kassie Mallick, MD  Continuous Glucose Sensor (FREESTYLE LIBRE 3 PLUS SENSOR) MISC Change sensor every 15 days. 03/10/24   Jerrell Cleatus Ned, MD  EPINEPHrine  0.3 mg/0.3 mL IJ SOAJ injection Inject 0.3 mg into the muscle as needed for anaphylaxis. 07/02/22   Iva Marty Saltness, MD  EQUATE STOOL SOFTENER 100 MG capsule Take 100 mg by mouth 2 (two) times daily as needed for moderate constipation. 08/21/21   [provider]  escitalopram  (LEXAPRO ) 20 MG tablet Take 1 tablet (20 mg total) by mouth at bedtime. 01/26/19   Kassie Mallick, MD  esomeprazole  (NEXIUM ) 40 MG capsule Take 1 capsule (40 mg total) by mouth 2 (two) times daily before a meal. 03/12/24   Ezzard,  Sonny RAMAN, PA-C  fluticasone  (FLONASE ) 50 MCG/ACT nasal spray Place 2 sprays into both nostrils at bedtime as needed for allergies.    [provider]  glimepiride  (AMARYL ) 1 MG tablet Take 1 tablet (1 mg total) by mouth daily as needed (blood glucose above 200). 03/03/24   Thapa, [place], MD  glucose blood (ACCU-CHEK AVIVA PLUS) test strip 1 each by Other route daily. And lancets 1/day 05/30/21   Kassie Mallick, MD  ipratropium (ATROVENT ) 0.02 % nebulizer solution INHALE 1 VIAL IN NEBULIZER EVERY 6 HOURS AS NEEDED 03/03/23   Iva Marty Saltness, MD  LANTUS  SOLOSTAR 100 UNIT/ML Solostar Pen Inject 15 Units into the skin daily. 03/03/24   Thapa, [place], MD  levothyroxine  (SYNTHROID ) 150 MCG tablet Take 1 tablet (150 mcg total) by  mouth daily. 03/07/24   Jerrell Cleatus Ned, MD  Magnesium  250 MG TABS Take 250 mg by mouth at bedtime.     [provider]  NON FORMULARY at bedtime. CPAP at night    [provider]  ondansetron  (ZOFRAN -ODT) 4 MG disintegrating tablet 4mg  ODT q4 hours prn nausea/vomit 03/16/24   Zammit, Joseph, MD  rosuvastatin  (CRESTOR ) 10 MG tablet Take 10 mg by mouth every Saturday. 12/05/21   [provider]  Simethicone  250 MG CAPS Take 500 mg by mouth at bedtime.    [provider]  sucralfate  (CARAFATE ) 1 GM/10ML suspension TAKE 10 ML BY MOUTH  4 TIMES DAILY AS NEEDED 06/09/22   Rudy Josette RAMAN, PA-C    Allergies: Bee venom, Ketorolac  tromethamine , Hydrochlorothiazide, Levofloxacin, Levomenol, Other, Tramadol , Triamcinolone acetonide, Depo-medrol  [methylprednisolone ], Menthol, Budesonide -formoterol  fumarate, Lisinopril, Losartan  potassium, Nystatin, Penicillins, and Wound dressing adhesive    Review of Systems  Constitutional:  Negative for fever.  HENT:  Positive for sore throat.   Gastrointestinal:  Negative for vomiting.    Updated Vital Signs BP (!) 155/95 (BP Location: Left Arm)   Pulse (!) 104   Temp 98.1 F (36.7 C)   Resp 18   Ht 1.6 m (5' 3)   Wt 122.9 kg   LMP 10/09/2014   SpO2 99%   BMI 48.01 kg/m   Physical Exam CONSTITUTIONAL: Well developed/well nourished HEAD: Normocephalic/atraumatic EYES: EOMI/PERRL ENMT: Mucous membranes moist, no drooling, no stridor, no angioedema, no erythema or exudates NECK: supple no meningeal signs SPINE/BACK:entire spine nontender CV: S1/S2 noted, no murmurs/rubs/gallops noted LUNGS: Lungs are clear to auscultation bilaterally, no apparent distress ABDOMEN: soft, nontender, no rebound or guarding, bowel sounds noted throughout abdomen NEURO: Pt is awake/alert/appropriate, moves all extremitiesx4.  No facial droop.   EXTREMITIES: pulses normal/equal, full ROM SKIN: warm, color normal PSYCH: no  abnormalities of mood noted, alert and oriented to situation  (all labs ordered are listed, but only abnormal results are displayed) Labs Reviewed  RESP PANEL BY RT-PCR (RSV, FLU A&B, COVID)  [id number]  GROUP A STREP BY PCR  CBC WITH DIFFERENTIAL/PLATELET  BASIC METABOLIC PANEL WITH GFR  TROPONIN I (HIGH SENSITIVITY)    EKG: EKG Interpretation Date/Time:  Sunday March 27 2024 22:36:31 EDT Ventricular Rate:  95 PR Interval:  152 QRS Duration:  68 QT Interval:  364 QTC Calculation: 457 R Axis:   60  Text Interpretation: Normal sinus rhythm Marked ST abnormality, possible inferior subendocardial injury Abnormal ECG When compared with ECG of 07-Jan-2024 12:47, ST depressions more pronounced Confirmed by Towana Sharper [phone]) on 03/27/2024 10:42:09 PM  Radiology: [facility] Chest Portable 1 View Result Date: 03/27/2024 CLINICAL DATA:  Sore throat and chest congestion.  EXAM: PORTABLE CHEST 1 VIEW COMPARISON:  October 29, 2023 FINDINGS: The heart size and mediastinal contours are within normal limits. Very mild linear atelectasis is seen along the periphery of the bilateral lung bases. There is no evidence of acute infiltrate, pleural effusion or pneumothorax. The visualized skeletal structures are unremarkable. IMPRESSION: No active disease. Electronically Signed   By: Suzen Dials M.D.   On: 03/27/2024 23:03    {Document cardiac monitor, telemetry assessment procedure when appropriate:32947} Procedures   Medications Ordered in the ED  alum & mag hydroxide-simeth (MAALOX/MYLANTA) 200-200-20 MG/5ML suspension 30 mL (has no administration in time range)      {Click here for ABCD2, HEART and other calculators REFRESH Note before signing:1}                              Medical Decision Making Amount and/or Complexity of Data Reviewed Labs: ordered. Radiology: ordered.  Risk OTC drugs.   This patient presents to the ED for concern of sore throat, upper abdominal discomfort, this  involves an extensive number of treatment options, and is a complaint that carries with it a high risk of complications and morbidity.  The differential diagnosis includes but is not limited to pancreatitis, gastritis, peptic ulcer disease, appendicitis, bowel obstruction, bowel perforation, diverticulitis, AAA, ischemic bowel, ACS    Comorbidities that complicate the patient evaluation: Patient's presentation is complicated by their history of obesity   Additional history obtained: Records reviewed Primary Care Documents  Lab Tests: I Ordered, and personally interpreted labs.  The pertinent results include: Labs overall reassuring  Imaging Studies ordered: I ordered imaging studies including X-ray chest  I independently visualized and interpreted imaging which showed no acute findings I agree with the radiologist interpretation   Medicines ordered and prescription drug management: I ordered medication including GI cocktail for sore throat and burning Reevaluation of the patient after these medicines showed that the patient    {resolved/improved/worsened:23923::improved}  Test Considered: Patient is low risk / negative by ***, therefore do not feel that *** is indicated.  Critical Interventions:  ***  Consultations Obtained: I requested consultation with the {consultation:26851}, and discussed  findings as well as pertinent plan - they recommend: ***  Reevaluation: After the interventions noted above, I reevaluated the patient and found that they have :{resolved/improved/worsened:23923::improved}  Complexity of problems addressed: Patient's presentation is most consistent with  acute presentation with potential threat to life or bodily function  Disposition: After consideration of the diagnostic results and the patient's response to treatment,  I feel that the patent would benefit from {disposition:26850}.     {Document critical care time when appropriate  Document  review of labs and clinical decision tools ie [id number], etc  Document your independent review of radiology images and any outside records  Document your discussion with family members, caretakers and with consultants  Document social determinants of health affecting pt's care  Document your decision making why or why not admission, treatments were needed:32947:::1}   Final diagnoses:  None    ED Discharge Orders     None

## 2024-03-27 NOTE — ED Triage Notes (Signed)
 Pt via POV c/o sore throat and chest congestion x 1 week, started on Z pack on Friday. She says her sore throat was improving but today started feeling worse again with asthma attack and says her chest feels weird. She feels like she is having hot flashes every once in a while; pt notes she is perimenopausal.

## 2024-03-28 ENCOUNTER — Other Ambulatory Visit (HOSPITAL_COMMUNITY): Payer: Self-pay

## 2024-03-28 MED ORDER — OMEPRAZOLE 40 MG PO CPDR: 40.0000 mg | DELAYED_RELEASE_CAPSULE | Freq: Every day | ORAL | 0 refills | Status: DC

## 2024-03-28 NOTE — ED Notes (Signed)
 Pt verbalized understanding of d/c instructions, prescription and follow up care. Pt wheeled out of ED via her personal motorized electric wheelchair.

## 2024-03-31 ENCOUNTER — Other Ambulatory Visit (HOSPITAL_COMMUNITY): Payer: Self-pay

## 2024-04-04 ENCOUNTER — Ambulatory Visit: Admitting: Bariatrics

## 2024-04-13 ENCOUNTER — Ambulatory Visit (INDEPENDENT_AMBULATORY_CARE_PROVIDER_SITE_OTHER): Admitting: Internal Medicine

## 2024-04-13 ENCOUNTER — Encounter: Payer: Self-pay | Admitting: Internal Medicine

## 2024-04-13 VITALS — BP 152/92 | HR 87 | Ht 63.0 in | Wt 265.4 lb

## 2024-04-13 DIAGNOSIS — Z87891 Personal history of nicotine dependence: Secondary | ICD-10-CM | POA: Diagnosis not present

## 2024-04-13 DIAGNOSIS — Z6841 Body Mass Index (BMI) 40.0 and over, adult: Secondary | ICD-10-CM | POA: Diagnosis not present

## 2024-04-13 DIAGNOSIS — J45991 Cough variant asthma: Secondary | ICD-10-CM | POA: Diagnosis not present

## 2024-04-13 MED ORDER — BUDESONIDE-FORMOTEROL FUMARATE 80-4.5 MCG/ACT IN AERO
INHALATION_SPRAY | RESPIRATORY_TRACT | 11 refills | Status: DC
Start: 2024-04-13 — End: 2024-04-15

## 2024-04-13 NOTE — Patient Instructions (Addendum)
 Prilosec 40 mg Take 30-60 min before first meal of the day   Plan A = Automatic = Always=    Symbicort  80  Take 1-2  puff  first thing in am and then another 1-2 puffs about 12 hours later.   Work on inhaler technique:  relax and gently blow all the way out then take a nice smooth full deep breath back in, triggering the inhaler at same time you start breathing in.  Hold breath in for at least  5 seconds if you can. Blow out symbicort   thru nose. Rinse and gargle with water  when done.  If mouth or throat bother you at all,  try brushing teeth/gums/tongue with arm and hammer toothpaste/ make a slurry and gargle and spit out.      Plan B = Backup (to supplement plan A, not to replace it) Only use your albuterol  inhaler as a rescue medication to be used if you can't catch your breath by resting or doing a relaxed purse lip breathing pattern.  - The less you use it, the better it will work when you need it. - Ok to use the inhaler up to 2 puffs  every 4 hours if you must but call for appointment if use goes up over your usual need - Don't leave home without it !!  (think of it like the spare tire for your car)    Plan C = Crisis (instead of Plan B but only if Plan B stops working) - only use your albuterol -ipatropium nebulizer if you first try Plan B and it fails to help > ok to use the nebulizer up to every 4 hours but if start needing it regularly call for immediate appointment   Plan D =  Medrol  4 mg as in past if ABC not working for you    Please schedule a follow up office visit in 6 weeks, call sooner if needed with all medications /inhalers/ solutions in hand so we can verify exactly what you are taking. This includes all medications from all doctors and over the counters

## 2024-04-13 NOTE — Assessment & Plan Note (Signed)
 Body mass index is 47.01 kg/m.  -  trending down slightly but a long way to go  Lab Results  Component Value Date   TSH 3.550 03/21/2024      Contributing to doe and risk of GERD/dvt/PE >>>   reviewed the need and the process to achieve and maintain neg calorie balance > defer f/u primary care including intermittently monitoring thyroid  status            Each maintenance medication was reviewed in detail including emphasizing most importantly the difference between maintenance and prns and under what circumstances the prns are to be triggered using an action plan format where appropriate.  Total time for H and P, chart review, counseling, reviewing hfa/spacer/ neb  device(s) and generating customized AVS unique to this office visit / same day charting = 60 min new pt eval  or multiple  refractory respiratory  symptoms of uncertain etiology

## 2024-04-13 NOTE — Progress Notes (Signed)
 Heather Fry, female    DOB: 1976/01/03    MRN: [id number]   Brief patient profile:  48  yowf  quit smoking 1999  referred to pulmonary clinic in [place]  04/13/2024 by Dr Bertell  for asthma eval with symptom onset 10/2019 w/in a year of thyroidectomy assoc with marked wt gain.     Pt not previously seen by PCCM service.     History of Present Illness  04/13/2024  Pulmonary/ 1st office eval/ Tyreshia Ingman / [facility] no  maint rx /  medrol  4 mg x 10 for emergencies  Chief Complaint  Patient presents with   Pulmonary Consult    Referred by Dr. Lavona for eval of asthma.   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    Also denies any obvious fluctuation of symptoms with weather or environmental changes or other aggravating or alleviating factors except as outlined above   No unusual exposure hx or h/o childhood pna/ asthma or knowledge of premature birth.  Current Allergies, Complete Past Medical History, Past Surgical History, Family History, and Social History were reviewed in [name].  ROS  The following are not active complaints unless bolded Hoarseness, sore throat, dysphagia= globus, dental problems, itching, sneezing,  nasal congestion or discharge of excess mucus or purulent secretions, ear ache,   fever, chills, sweats, unintended wt loss or wt gain, classically pleuritic or exertional cp,  orthopnea pnd or arm/hand swelling  or leg swelling, presyncope, palpitations, abdominal pain,  anorexia, nausea, vomiting, diarrhea  or change in bowel habits or change in bladder habits, change in stools or change in urine, dysuria, hematuria,  rash, arthralgias, visual complaints, headache, numbness, weakness or ataxia or problems with walking or coordination,  change in mood or  memory.            Outpatient Medications Prior to Visit  Medication Sig Dispense Refill   acarbose  (PRECOSE ) 100 MG tablet Take 1 tablet (100 mg total) by mouth 3 (three) times daily with meals. 270 tablet 3   albuterol  (VENTOLIN  HFA) 108 (90 Base) MCG/ACT inhaler Inhale 2 puffs into the lungs every 6 (six) hours as needed. 18 g 11   cetirizine  (ZYRTEC ) 10 MG tablet Take 10 mg by mouth daily.     Cholecalciferol  (VITAMIN D3) 125 MCG (5000 UT) CAPS Take 1 capsule (5,000 Units total) by mouth daily. (Patient taking differently: Take 10,000 Units by mouth daily.) 30 capsule 11   EPINEPHrine  0.3 mg/0.3 mL IJ SOAJ injection Inject 0.3 mg into the muscle as needed for anaphylaxis. 2 each 1   EQUATE STOOL SOFTENER 100 MG capsule Take 100 mg by mouth 2 (two) times daily as needed for moderate constipation.     escitalopram  (LEXAPRO ) 20 MG tablet Take 1 tablet (20 mg total) by mouth at bedtime. 30 tablet 5   fluticasone  (FLONASE ) 50 MCG/ACT nasal spray Place 2 sprays into both nostrils at bedtime as needed for allergies.  glimepiride  (AMARYL ) 1 MG tablet Take 1 tablet (1 mg total) by mouth daily as needed (blood glucose above 200). 270 tablet 3   glucose blood (ACCU-CHEK AVIVA PLUS) test strip 1 each by Other route daily. And lancets 1/day 100 each 3   ipratropium (ATROVENT ) 0.02 % nebulizer solution INHALE 1 VIAL IN NEBULIZER EVERY 6 HOURS AS NEEDED 180 mL 0   ipratropium (ATROVENT ) 0.02 % nebulizer solution Take 0.5 mg by nebulization 4 (four) times daily.     LANTUS  SOLOSTAR 100 UNIT/ML Solostar Pen Inject 15 Units into the skin daily. 15 mL 4   levothyroxine  (SYNTHROID ) 137 MCG tablet Take 137 mcg by mouth daily  before breakfast.     Magnesium  250 MG TABS Take 250 mg by mouth at bedtime.      NON FORMULARY at bedtime. CPAP at night     omeprazole  (PRILOSEC) 40 MG capsule Take 1 capsule (40 mg total) by mouth daily. 30 capsule 0   ondansetron  (ZOFRAN -ODT) 4 MG disintegrating tablet 4mg  ODT q4 hours prn nausea/vomit 10 tablet 0   rosuvastatin  (CRESTOR ) 10 MG tablet Take 10 mg by mouth every Saturday.     Simethicone  250 MG CAPS Take 500 mg by mouth at bedtime.     sucralfate  (CARAFATE ) 1 GM/10ML suspension TAKE 10 ML BY MOUTH  4 TIMES DAILY AS NEEDED 828 mL 0   Ascorbic Acid (VITAMIN C) 1000 MG tablet Take 2,500 mg by mouth daily.     Continuous Glucose Sensor (FREESTYLE LIBRE 3 PLUS SENSOR) MISC Change sensor every 15 days. 2 each 1   levothyroxine  (SYNTHROID ) 150 MCG tablet Take 1 tablet (150 mcg total) by mouth daily.     No facility-administered medications prior to visit.    Past Medical History:  Diagnosis Date   Allergic asthma    Anemia    Anxiety    Arthritis    Constipation    Depression    Diabetes mellitus without complication (HCC)    Diverticulosis    Fatty liver    moderate to severe   Fatty liver    GERD (gastroesophageal reflux disease)    Graves disease    Headache    tension, ocular migraines   High cholesterol    Hypothyroidism    Internal hemorrhoids    Osteoarthritis    Prolonged Q-T interval on ECG    Sleep apnea    Stomach ulcer    Thyroid  disease    Uterine fibroid    Vitamin D  deficiency       Objective:     BP (!) 152/92   Pulse 87   Ht 5' 3 (1.6 m)   Wt 265 lb 6.4 oz (120.4 kg)   LMP 10/09/2014   SpO2 99% Comment: on RA  BMI 47.01 kg/m   SpO2: 99 % (on RA)  Wt Readings from Last 3 Encounters:  04/13/24 265 lb 6.4 oz (120.4 kg)  03/27/24 271 lb (122.9 kg)  03/24/24 272 lb (123.4 kg)     MO (by BMI) w/c bound wf nad    HEENT : Oropharynx  clear      Nasal turbinates nl    NECK :  without  apparent JVD/ palpable Nodes/TM     LUNGS: no acc muscle use,  Nl contour chest with distant BS  bilaterally without cough on insp or exp maneuvers   CV:  RRR  no s3 or murmur or increase in P2, and no edema   ABD:  soft and  nontender   MS:    ext warm without deformities Or obvious joint restrictions  calf tenderness, cyanosis or clubbing    SKIN: warm and dry without lesions    NEURO:  alert, approp, nl sensorium with  no motor or cerebellar deficits apparent.     I personally reviewed images and agree with radiology impression as follows:  CXR:   portable 03/27/24  No active dz      Assessment   Cough variant asthma vs UACS Onset symptoms 10/2019  - Spirometry  07/14/23 FEV1 2.60 (95%)  Ratio 0.87  - 04/13/2024  After extensive coaching inhaler device,  effectiveness =    75% (short Ti/ poor trigger /breath coordination s spacer) > try symbicort  80 1-2 bid   DDX of  difficult airways management almost all start with A and  include Adherence, Ace Inhibitors, Acid Reflux, Active Sinus Disease, Alpha 1 Antitripsin deficiency, Anxiety masquerading as Airways dz,  ABPA,  Allergy (esp in young), Aspiration (esp in elderly), Adverse effects of meds,  Active smoking or vaping, A bunch of PE's (a small clot burden can't cause this syndrome unless there is already severe underlying pulm or vascular dz with poor reserve) plus two Bs  = Bronchiectasis and Beta blocker use..and one C= CHF   BOLDED of greatest concern here  Adherence is always the initial prime suspect and is a multilayered concern that requires a trust but verify approach in every patient - starting with knowing how to use medications, especially inhalers, correctly, keeping up with refills and understanding the fundamental difference between maintenance and prns vs those medications only taken for a very short course and then stopped and not refilled.  - see hfa teaching - either use spacers for all hfa or none / verify on return  - ABCD action plan  generated - see avs for instructions unique to this ov  - return with all meds in hand using a trust but verify approach to confirm accurate Medication  Reconciliation The principal here is that until we are certain that the  patients are doing what we've asked, it makes no sense to ask them to do more.   ? Acid (or non-acid) GERD > always difficult to exclude as up to 75% of pts in some series report no assoc GI/ Heartburn symptoms> rec max (24h)  acid suppression and diet restrictions/ reviewed and instructions given in writing.  >>> change PPI to qam 30 min ac   ? Allergy  > doubt it so use very low dose ICS if possible to reduce side effects and if gets thrush use clotrimazole lozenges and enforce spacers.         Morbid obesity due to excess calories (HCC) Body mass index is 47.01 kg/m.  -  trending down slightly but a long way to go  Lab Results  Component Value Date   TSH 3.550 03/21/2024      Contributing to doe and risk of GERD/dvt/PE >>>   reviewed the need and the process to achieve and maintain neg calorie balance > defer f/u primary care including intermittently monitoring thyroid  status            Each maintenance medication was reviewed in detail including emphasizing most importantly the difference between maintenance and prns and under what circumstances the prns are to be triggered using an action plan format where appropriate.  Total time for H and P, chart review, counseling, reviewing hfa/spacer/ neb  device(s) and generating customized AVS unique to  this office visit / same day charting = 60 min new pt eval  or multiple  refractory respiratory  symptoms of uncertain etiology           Ozell America, MD 04/13/2024

## 2024-04-13 NOTE — Assessment & Plan Note (Addendum)
 Onset symptoms 10/2019  - Spirometry  07/14/23 FEV1 2.60 (95%)  Ratio 0.87  - 04/13/2024  After extensive coaching inhaler device,  effectiveness =    75% (short Ti/ poor trigger /breath coordination s spacer) > try symbicort  80 1-2 bid   DDX of  difficult airways management almost all start with A and  include Adherence, Ace Inhibitors, Acid Reflux, Active Sinus Disease, Alpha 1 Antitripsin deficiency, Anxiety masquerading as Airways dz,  ABPA,  Allergy (esp in young), Aspiration (esp in elderly), Adverse effects of meds,  Active smoking or vaping, A bunch of PE's (a small clot burden can't cause this syndrome unless there is already severe underlying pulm or vascular dz with poor reserve) plus two Bs  = Bronchiectasis and Beta blocker use..and one C= CHF   BOLDED of greatest concern here  Adherence is always the initial prime suspect and is a multilayered concern that requires a trust but verify approach in every patient - starting with knowing how to use medications, especially inhalers, correctly, keeping up with refills and understanding the fundamental difference between maintenance and prns vs those medications only taken for a very short course and then stopped and not refilled.  - see hfa teaching - either use spacers for all hfa or none / verify on return  - ABCD action plan generated - see avs for instructions unique to this ov  - return with all meds in hand using a trust but verify approach to confirm accurate Medication  Reconciliation The principal here is that until we are certain that the  patients are doing what we've asked, it makes no sense to ask them to do more.   ? Acid (or non-acid) GERD > always difficult to exclude as up to 75% of pts in some series report no assoc GI/ Heartburn symptoms> rec max (24h)  acid suppression and diet restrictions/ reviewed and instructions given in writing.  >>> change PPI to qam 30 min ac   ? Allergy  > doubt it so use very low dose ICS if  possible to reduce side effects and if gets thrush use clotrimazole lozenges and enforce spacers.

## 2024-04-14 ENCOUNTER — Other Ambulatory Visit (HOSPITAL_COMMUNITY): Payer: Self-pay

## 2024-04-14 ENCOUNTER — Telehealth: Payer: Self-pay

## 2024-04-14 NOTE — Telephone Encounter (Signed)
*  Pulm  Pharmacy Patient Advocate Encounter   Received notification from CoverMyMeds that prior authorization for Breyna  80-4.5MCG/ACT aerosol  is required/requested.   Insurance verification completed.   The patient is insured through [facility] .   Per test claim:  Brand Symbicort  is preferred by the insurance.  If suggested medication is appropriate, Please send in a new RX and discontinue this one. If not, please advise as to why it's not appropriate so that we may request a Prior Authorization. Please note, some preferred medications may still require a PA.  If the suggested medications have not been trialed and there are no contraindications to their use, the PA will not be submitted, as it will not be approved.   CMM Key: [id number]

## 2024-04-15 MED ORDER — BUDESONIDE-FORMOTEROL FUMARATE 80-4.5 MCG/ACT IN AERO: 2.0000 | INHALATION_SPRAY | Freq: Two times a day (BID) | RESPIRATORY_TRACT | 11 refills | Status: DC

## 2024-04-15 NOTE — Addendum Note (Signed)
 Addended by: Emillie Chasen M on: 04/15/2024 09:21 AM   Modules accepted: Orders

## 2024-04-15 NOTE — Telephone Encounter (Signed)
 I called [facility] andasked them to d/c the generic and fill brand only symbicort .

## 2024-04-16 ENCOUNTER — Telehealth: Admitting: Nurse Practitioner

## 2024-04-16 ENCOUNTER — Encounter: Payer: Self-pay | Admitting: Internal Medicine

## 2024-04-16 DIAGNOSIS — J45991 Cough variant asthma: Secondary | ICD-10-CM | POA: Diagnosis not present

## 2024-04-16 NOTE — Patient Instructions (Signed)
 Elenor JINNY Romano, thank you for joining Haze LELON Servant, NP for today's virtual visit.  While this provider is not your primary care provider (PCP), if your PCP is located in our provider database this encounter information will be shared with them immediately following your visit.   A [facility] MyChart account gives you access to today's visit and all your visits, tests, and labs performed at [facility]  click here if you don't have a [facility] MyChart account or go to [email or web address]  Consent: (Patient) Heather Fry provided verbal consent for this virtual visit at the beginning of the encounter.  Current Medications:  Current Outpatient Medications:    acarbose  (PRECOSE ) 100 MG tablet, Take 1 tablet (100 mg total) by mouth 3 (three) times daily with meals., Disp: 270 tablet, Rfl: 3   albuterol  (VENTOLIN  HFA) 108 (90 Base) MCG/ACT inhaler, Inhale 2 puffs into the lungs every 6 (six) hours as needed., Disp: 18 g, Rfl: 11   budesonide -formoterol  (SYMBICORT ) 80-4.5 MCG/ACT inhaler, Inhale 2 puffs into the lungs in the morning and at bedtime., Disp: 1 each, Rfl: 11   cetirizine  (ZYRTEC ) 10 MG tablet, Take 10 mg by mouth daily., Disp: , Rfl:    EPINEPHrine  0.3 mg/0.3 mL IJ SOAJ injection, Inject 0.3 mg into the muscle as needed for anaphylaxis., Disp: 2 each, Rfl: 1   EQUATE STOOL SOFTENER 100 MG capsule, Take 100 mg by mouth 2 (two) times daily as needed for moderate constipation., Disp: , Rfl:    escitalopram  (LEXAPRO ) 20 MG tablet, Take 1 tablet (20 mg total) by mouth at bedtime., Disp: 30 tablet, Rfl: 5   fluticasone  (FLONASE ) 50 MCG/ACT nasal spray, Place 2 sprays into both nostrils at bedtime as needed for allergies., Disp: , Rfl:    glimepiride  (AMARYL ) 1 MG tablet, Take 1 tablet (1 mg total) by mouth daily as needed (blood glucose above 200)., Disp: 270 tablet, Rfl: 3   glucose blood (ACCU-CHEK AVIVA PLUS) test strip, 1 each by Other route daily. And lancets  1/day, Disp: 100 each, Rfl: 3   ipratropium (ATROVENT ) 0.02 % nebulizer solution, INHALE 1 VIAL IN NEBULIZER EVERY 6 HOURS AS NEEDED, Disp: 180 mL, Rfl: 0   ipratropium (ATROVENT ) 0.02 % nebulizer solution, Take 0.5 mg by nebulization 4 (four) times daily., Disp: , Rfl:    LANTUS  SOLOSTAR 100 UNIT/ML Solostar Pen, Inject 15 Units into the skin daily., Disp: 15 mL, Rfl: 4   levothyroxine  (SYNTHROID ) 137 MCG tablet, Take 137 mcg by mouth daily before breakfast., Disp: , Rfl:    Magnesium  250 MG TABS, Take 250 mg by mouth at bedtime. , Disp: , Rfl:    NON FORMULARY, at bedtime. CPAP at night, Disp: , Rfl:    omeprazole  (PRILOSEC) 40 MG capsule, Take 1 capsule (40 mg total) by mouth daily., Disp: 30 capsule, Rfl: 0   ondansetron  (ZOFRAN -ODT) 4 MG disintegrating tablet, 4mg  ODT q4 hours prn nausea/vomit, Disp: 10 tablet, Rfl: 0   rosuvastatin  (CRESTOR ) 10 MG tablet, Take 10 mg by mouth every Saturday., Disp: , Rfl:    Simethicone  250 MG CAPS, Take 500 mg by mouth at bedtime., Disp: , Rfl:    sucralfate  (CARAFATE ) 1 GM/10ML suspension, TAKE 10 ML BY MOUTH  4 TIMES DAILY AS NEEDED, Disp: 828 mL, Rfl: 0   Medications ordered in this encounter:  No orders of the defined types were placed in this encounter.    *If you need refills on other medications prior to your next  appointment, please contact your pharmacy*  Follow-Up: Call back or seek an in-person evaluation if the symptoms worsen or if the condition fails to improve as anticipated.  [facility] [phone]  Other Instructions F/U with Dr. Darlean via rhona on Monday Stop symbicort  Use prn SABA and nebs if needed over the weekend only   If you have been instructed to have an in-person evaluation today at a local Urgent Care facility, please use the link below. It will take you to a list of all of our available [facility], including address, phone number and hours of operation. Please do not delay care.  [facility]  If you or a family member do not have a primary care provider, use the link below to schedule a visit and establish care. When you choose a [facility] primary care physician or advanced practice provider, you gain a long-term partner in health. Find a Primary Care Provider  Learn more about [facility]'s in-office and virtual care options: [facility] - Get Care Now

## 2024-04-16 NOTE — Progress Notes (Signed)
 Virtual Visit Consent   JAPNEET STAGGS, you are scheduled for a virtual visit with a [facility] provider today. Just as with appointments in the office, your consent must be obtained to participate. Your consent will be active for this visit and any virtual visit you may have with one of our providers in the next 365 days. If you have a MyChart account, a copy of this consent can be sent to you electronically.  As this is a virtual visit, video technology does not allow for your provider to perform a traditional examination. This may limit your provider's ability to fully assess your condition. If your provider identifies any concerns that need to be evaluated in person or the need to arrange testing (such as labs, EKG, etc.), we will make arrangements to do so. Although advances in technology are sophisticated, we cannot ensure that it will always work on either your end or our end. If the connection with a video visit is poor, the visit may have to be switched to a telephone visit. With either a video or telephone visit, we are not always able to ensure that we have a secure connection.  By engaging in this virtual visit, you consent to the provision of healthcare and authorize for your insurance to be billed (if applicable) for the services provided during this visit. Depending on your insurance coverage, you may receive a charge related to this service.  I need to obtain your verbal consent now. Are you willing to proceed with your visit today? ZAREA DIESING has provided verbal consent on 04/16/2024 for a virtual visit (video or telephone). Haze LELON Servant, NP  Date: 04/16/2024 7:23 PM   Virtual Visit via Video Note   I, Haze LELON Servant, connected with  KENSLEY VALLADARES  ([id number], 01/20/1976) on 04/16/24 at  7:15 PM EDT by a video-enabled telemedicine application and verified that I am speaking with the correct person using two identifiers.  Location: Patient: Virtual Visit Location  Patient: Home Provider: Virtual Visit Location Provider: Home Office   I discussed the limitations of evaluation and management by telemedicine and the availability of in person appointments. The patient expressed understanding and agreed to proceed.    History of Present Illness: KENDALYNN WIDEMAN is a 48 y.o. who identifies as a female who was assigned female at birth, and is being seen today for asthma.  Ms Cartwright was recently started on symbicort  for possible cough variant asthma. She states after her first dose she noticed her blood pressure increased, she felt dizzy and overall not feeling well. She can not tolerate depo medrol  due to similar side effects.   Problems:  Patient Active Problem List   Diagnosis Date Noted   Cough variant asthma vs UACS 04/13/2024   Acute bacterial sinusitis 03/24/2024   Bilateral primary osteoarthritis of knee 03/07/2024   Moderate persistent asthma, uncomplicated 06/08/2022   Seasonal and perennial allergic rhinitis 06/08/2022   Multiple drug allergies 06/08/2022   Type 2 diabetes mellitus with hyperglycemia (HCC) 04/04/2022   Morbid obesity due to excess calories (HCC) 01/11/2022   Psychophysiologic insomnia 01/11/2022   Dyslipidemia 10/15/2021   Essential hypertension 10/15/2021   Hypocalcemia 08/08/2021   Disc disease, degenerative, cervical 04/19/2021   Right carpal tunnel syndrome 04/19/2021   Irritable bowel syndrome 04/17/2021   Postoperative hypothyroidism 07/29/2020   Idiopathic peripheral neuropathy 04/18/2020   OSA (obstructive sleep apnea) 04/18/2020   RLS (restless legs syndrome) 04/18/2020   Obesity, Class III, BMI 40-49.9 (  morbid obesity) 01/20/2020   Vitamin B 12 deficiency 07/28/2019   Hypomagnesemia 03/29/2019   Perimenopausal symptoms 11/30/2018   Vitamin D  deficiency 10/06/2018   Gastroesophageal reflux disease 01/28/2017   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   Gastric polyp    Depression     Allergies:  Allergies  Allergen  Reactions   Bee Venom Shortness Of Breath, Swelling and Palpitations   Ketorolac  Tromethamine  Anaphylaxis, Hives and Other (See Comments)   Hydrochlorothiazide Other (See Comments)    Dehydration  Dehydration, Dehydration   Levofloxacin Other (See Comments)    Pt can not have due to taking Lexapro    Levomenol Other (See Comments)   Other Other (See Comments)   Tramadol  Hives   Triamcinolone Acetonide Other (See Comments)    Elevated blood pressure, caused diabetes, changed tyroid electrolytes Patient has no tyroid     Depo-Medrol  [Methylprednisolone ] Hypertension    Pt states high BP, high glucose and dizzy   Budesonide -Formoterol  Fumarate Rash   Lisinopril Nausea Only and Other (See Comments)   Losartan  Potassium Nausea Only   Nystatin Rash    oral   Penicillins Other (See Comments)    Loopy, childhood allergy   ALL CILLINS  Unknown childhood reactions Has patient had a PCN reaction causing anaphylaxis, immediate rash, facial/tongue/throat swelling, SOB or lightheadedness with hypotension? no, Has patient had a PCN reaction causing severe rash involving mucus membranes or skin necrosis?  no, Has patient had a PCN reaction that required hospitilization?no, , Has patient had a PCN reaction occurring within the last 10 years?  no, If all of the above answers are no then may proceed with cephalosporin use.   Wound Dressing Adhesive Rash   Medications:  Current Outpatient Medications:    acarbose  (PRECOSE ) 100 MG tablet, Take 1 tablet (100 mg total) by mouth 3 (three) times daily with meals., Disp: 270 tablet, Rfl: 3   albuterol  (VENTOLIN  HFA) 108 (90 Base) MCG/ACT inhaler, Inhale 2 puffs into the lungs every 6 (six) hours as needed., Disp: 18 g, Rfl: 11   budesonide -formoterol  (SYMBICORT ) 80-4.5 MCG/ACT inhaler, Inhale 2 puffs into the lungs in the morning and at bedtime., Disp: 1 each, Rfl: 11   cetirizine  (ZYRTEC ) 10 MG tablet, Take 10 mg by mouth daily., Disp: , Rfl:     EPINEPHrine  0.3 mg/0.3 mL IJ SOAJ injection, Inject 0.3 mg into the muscle as needed for anaphylaxis., Disp: 2 each, Rfl: 1   EQUATE STOOL SOFTENER 100 MG capsule, Take 100 mg by mouth 2 (two) times daily as needed for moderate constipation., Disp: , Rfl:    escitalopram  (LEXAPRO ) 20 MG tablet, Take 1 tablet (20 mg total) by mouth at bedtime., Disp: 30 tablet, Rfl: 5   fluticasone  (FLONASE ) 50 MCG/ACT nasal spray, Place 2 sprays into both nostrils at bedtime as needed for allergies., Disp: , Rfl:    glimepiride  (AMARYL ) 1 MG tablet, Take 1 tablet (1 mg total) by mouth daily as needed (blood glucose above 200)., Disp: 270 tablet, Rfl: 3   glucose blood (ACCU-CHEK AVIVA PLUS) test strip, 1 each by Other route daily. And lancets 1/day, Disp: 100 each, Rfl: 3   ipratropium (ATROVENT ) 0.02 % nebulizer solution, INHALE 1 VIAL IN NEBULIZER EVERY 6 HOURS AS NEEDED, Disp: 180 mL, Rfl: 0   ipratropium (ATROVENT ) 0.02 % nebulizer solution, Take 0.5 mg by nebulization 4 (four) times daily., Disp: , Rfl:    LANTUS  SOLOSTAR 100 UNIT/ML Solostar Pen, Inject 15 Units into the skin daily., Disp: 15 mL, Rfl: 4  levothyroxine  (SYNTHROID ) 137 MCG tablet, Take 137 mcg by mouth daily before breakfast., Disp: , Rfl:    Magnesium  250 MG TABS, Take 250 mg by mouth at bedtime. , Disp: , Rfl:    NON FORMULARY, at bedtime. CPAP at night, Disp: , Rfl:    omeprazole  (PRILOSEC) 40 MG capsule, Take 1 capsule (40 mg total) by mouth daily., Disp: 30 capsule, Rfl: 0   ondansetron  (ZOFRAN -ODT) 4 MG disintegrating tablet, 4mg  ODT q4 hours prn nausea/vomit, Disp: 10 tablet, Rfl: 0   rosuvastatin  (CRESTOR ) 10 MG tablet, Take 10 mg by mouth every Saturday., Disp: , Rfl:    Simethicone  250 MG CAPS, Take 500 mg by mouth at bedtime., Disp: , Rfl:    sucralfate  (CARAFATE ) 1 GM/10ML suspension, TAKE 10 ML BY MOUTH  4 TIMES DAILY AS NEEDED, Disp: 828 mL, Rfl: 0  Observations/Objective: Patient is well-developed, well-nourished in no acute  distress.  Resting comfortably at home.  Head is normocephalic, atraumatic.  No labored breathing.  Speech is clear and coherent with logical content.  Patient is alert and oriented at baseline.    Assessment and Plan: 1. Cough variant asthma vs UACS (Primary) F/U with Dr. Darlean via [facility] on Monday Stop symbicort  Use prn SABA and nebs if needed over the weekend only  Follow Up Instructions: I discussed the assessment and treatment plan with the patient. The patient was provided an opportunity to ask questions and all were answered. The patient agreed with the plan and demonstrated an understanding of the instructions.  A copy of instructions were sent to the patient via MyChart unless otherwise noted below.     The patient was advised to call back or seek an in-person evaluation if the symptoms worsen or if the condition fails to improve as anticipated.    Wayne Wicklund W Tandi Hanko, NP

## 2024-04-20 ENCOUNTER — Telehealth: Payer: Self-pay

## 2024-04-20 NOTE — Telephone Encounter (Signed)
 MYCHART message :  Hi Dr Darlean, so I gave the symbicort  another go and had the same reaction to it as I did both times before. I had weird side effects. I did an urgent care visit with [place] and the dr said not to take any more and to message you. It appears that I'm super sensitive to corticosteroids as well. So I would like to keep the regimen that is working for me with the plan d in case of bad attacks. The new way of using the inhaler is working very well. I'm using the timing the way you suggested and it seems to need less inhaler. Thank you.   Dr wert is this okay?

## 2024-04-20 NOTE — Telephone Encounter (Signed)
 See mychart enct

## 2024-04-25 ENCOUNTER — Ambulatory Visit (INDEPENDENT_AMBULATORY_CARE_PROVIDER_SITE_OTHER): Admitting: Student in an Organized Health Care Education/Training Program

## 2024-04-25 ENCOUNTER — Encounter: Payer: Self-pay | Admitting: Student in an Organized Health Care Education/Training Program

## 2024-04-25 DIAGNOSIS — N951 Menopausal and female climacteric states: Secondary | ICD-10-CM | POA: Diagnosis not present

## 2024-04-25 LAB — BASIC METABOLIC PANEL WITH GFR
BUN: 8 mg/dL (ref 6–23)
CO2: 27 meq/L (ref 19–32)
Calcium: 9.4 mg/dL (ref 8.4–10.5)
Chloride: 100 meq/L (ref 96–112)
Creatinine, Ser: 0.66 mg/dL (ref 0.40–1.20)
GFR: 103.92 mL/min (ref >60.00)
Glucose, Bld: 172 mg/dL — ABNORMAL HIGH (ref 70–99)
Potassium: 4.2 meq/L (ref 3.5–5.1)
Sodium: 139 meq/L (ref 135–145)

## 2024-04-25 LAB — MAGNESIUM: Magnesium: 2.1 mg/dL (ref 1.5–2.5)

## 2024-04-25 MED ORDER — ESTROGENS CONJUGATED 0.3 MG PO TABS: 0.3000 mg | ORAL_TABLET | Freq: Every day | ORAL | 2 refills | Status: DC

## 2024-04-25 NOTE — Assessment & Plan Note (Signed)
 Patient with about 3 years or perimenopausal symptoms that are worsening.  She had a hysterectomy around 2016.  She has discussed hormone replacement therapy before with her gynecologist.  She is interested in this therapy.  She has no contraindications to hormone replacement therapy, she has no history of breast cancer or estrogen-sensitive tumors, no active or prior venous thromboembolism or stroke, active liver disease, or undiagnosed vaginal bleeding.  She would need estrogen only hormone therapy because she does not have a uterus.  Her comorbidities are pretty well-controlled, she has diabetes that is doing pretty well with an A1c of 7.5%.  She may have a slightly above average risk of VTE because of her obesity and lack of mobility.  But I think the risk increase when adding a low-dose estrogen is still very small, less than 1% absolute increase.  I recommended starting Premarin  oral estrogen at 0.3 mg daily.  Follow-up with me in 6 weeks.  Can increase dose if needed for vasomotor symptoms.

## 2024-04-25 NOTE — Patient Instructions (Signed)
  VISIT SUMMARY: Today, we discussed your symptoms of facial and hand tingling, which you associate with low calcium  levels. We reviewed your history of thyroid  removal, acid reflux, herniated discs, and perimenopause. We also talked about your recent experiences with these symptoms and your current management strategies.  YOUR PLAN: -PARESTHESIA: Paresthesia refers to tingling or numbness in the skin. Your symptoms may be linked to low calcium  levels following your thyroid  removal. We will check your calcium , magnesium , and potassium levels to better understand the cause.  -HERNIATED CERVICAL DISCS: Herniated cervical discs can cause numbness and tingling in your hands. Since you haven't had an MRI in the past two years, we will coordinate with orthopedics or neurosurgery to manage this condition.  -GASTROESOPHAGEAL REFLUX DISEASE (GERD): GERD is a condition where stomach acid frequently flows back into the tube connecting your mouth and stomach. Your symptoms improved with sucralfate , indicating that GERD may be involved. Continue using sucralfate  as needed.  -PERIMENOPAUSE: Perimenopause is the transition period before menopause when hormonal changes occur. Your symptoms include hot flashes and flushing. We discussed the possibility of using estrogen-only therapy to manage your symptoms temporarily.  -DIABETES: Diabetes is a condition that affects how your body processes blood sugar. Your blood glucose levels are normal, but you occasionally experience low blood sugar. You also reported increased thirst and water  intake.  -GENERAL HEALTH MAINTENANCE: We discussed dietary modifications to help manage your perimenopausal symptoms, although they may be less effective than hormone therapy. We will review your routine labs to ensure everything is within normal ranges.  INSTRUCTIONS: Please follow up with orthopedics or neurosurgery for your herniated cervical discs. We will also check your calcium ,  magnesium , and potassium levels. Continue using sucralfate  as needed for your GERD symptoms. Consider the progesterone-only therapy we discussed for managing your perimenopausal symptoms. Make sure to stay hydrated and monitor your blood sugar levels.

## 2024-04-25 NOTE — Progress Notes (Signed)
 Acute Office Visit  Subjective:     Patient ID: Heather Fry, female    DOB: 06/23/1976, 48 y.o.   MRN: [id number]  Chief Complaint  Patient presents with   Fatigue    Patient states she is just not feeling herself today and would like blood work     HPI  Discussed the use of AI scribe software for clinical note transcription with the patient, who gave verbal consent to proceed.  History of Present Illness Heather Fry is a 48 year old female who presents with facial and hand tingling.  She experiences tingling in her face and right hand, which she associates with low calcium  levels. These symptoms typically occur when her calcium  is low, and she usually manages them by consuming dairy products or taking Tums. Recently, the symptoms progressed to numbness in her whole face. Despite normal blood pressure, blood sugar, and pulse, the symptoms persisted. She attempted to manage the symptoms by taking half of a 750 mg Tums and drinking a zero-sugar electrolyte drink, but the symptoms continued.  She has a history of thyroid  removal, which she believes contributes to her calcium  regulation issues. Her parathyroids were not removed, but they have previously shown reduced function. She does not take calcium  supplements daily, preferring to manage her intake through diet unless symptoms arise. She is currently in perimenopause, which she feels complicates her symptom management due to overlapping symptoms.  She also has a history of acid reflux, which has previously presented with similar symptoms. Last month, she visited the ER with similar symptoms, which were attributed to her acid reflux. She manages this condition with sucralfate , which she took last night when her stomach felt bloated, and noted that the numbness subsided afterward.  She has a history of herniated discs in her neck and lower back, which she believes may contribute to her symptoms. She has not had an MRI in the past  two years, although she is supposed to have annual MRIs to monitor her condition. She experiences difficulty walking and reports that her legs feel 'spinning'.  She has undergone carpal tunnel surgery on her right hand. No recent changes in her medication list. She feels very thirsty, although her blood sugar levels have not been significantly low. She also reports experiencing hot flashes, which she attributes to perimenopause, and notes that she feels flushed and sweaty without visible perspiration.      Objective:    BP (!) 151/90   Pulse 90   LMP 10/09/2014   SpO2 99%   Physical Exam  Gen: Mildly uncomfortable appearing woman Neuro: Alert, conversational, normal sensation on both sides of her face and in both hands.  Normal strength in her upper and lower extremities.  She uses a power scooter for mobility due to multiple orthopedic issues.  No current paresthesias on her exam. Heart: Regular, no murmur Lungs: Unlabored, clear throughout Abd: Soft, nontender Ext: Warm, no edema     Assessment & Plan:   Problem List Items Addressed This Visit       Medium    Perimenopausal symptoms (Chronic)   Patient with about 3 years or perimenopausal symptoms that are worsening.  She had a hysterectomy around 2016.  She has discussed hormone replacement therapy before with her gynecologist.  She is interested in this therapy.  She has no contraindications to hormone replacement therapy, she has no history of breast cancer or estrogen-sensitive tumors, no active or prior venous thromboembolism or stroke, active liver  disease, or undiagnosed vaginal bleeding.  She would need estrogen only hormone therapy because she does not have a uterus.  Her comorbidities are pretty well-controlled, she has diabetes that is doing pretty well with an A1c of 7.5%.  She may have a slightly above average risk of VTE because of her obesity and lack of mobility.  But I think the risk increase when adding a low-dose  estrogen is still very small, less than 1% absolute increase.  I recommended starting Premarin  oral estrogen at 0.3 mg daily.  Follow-up with me in 6 weeks.  Can increase dose if needed for vasomotor symptoms.      Relevant Medications   estrogens , conjugated, (PREMARIN ) 0.3 MG tablet     Low   Hypomagnesemia - Primary (Chronic)   Relevant Orders   Magnesium    Hypocalcemia (Chronic)   Relevant Orders   Basic metabolic panel with GFR    Meds ordered this encounter  Medications   DISCONTD: estrogens , conjugated, (PREMARIN ) 0.3 MG tablet    Sig: Take 1 tablet (0.3 mg total) by mouth daily. Take daily for 21 days then do not take for 7 days.    Dispense:  30 tablet    Refill:  2   estrogens , conjugated, (PREMARIN ) 0.3 MG tablet    Sig: Take 1 tablet (0.3 mg total) by mouth daily.    Dispense:  30 tablet    Refill:  2    Continuous dosing, no placebo, patient is without a uterus    Return in about 6 weeks (around 06/06/2024).  Cleatus Debby Specking, MD

## 2024-04-26 ENCOUNTER — Ambulatory Visit: Payer: Self-pay | Admitting: Student in an Organized Health Care Education/Training Program

## 2024-05-26 ENCOUNTER — Ambulatory Visit: Admitting: Internal Medicine

## 2024-06-06 ENCOUNTER — Ambulatory Visit: Admitting: Student in an Organized Health Care Education/Training Program

## 2024-06-06 ENCOUNTER — Encounter: Payer: Self-pay | Admitting: Student in an Organized Health Care Education/Training Program

## 2024-06-06 VITALS — BP 135/87 | HR 92 | Temp 98.8°F | Wt 272.0 lb

## 2024-06-06 DIAGNOSIS — Z794 Long term (current) use of insulin: Secondary | ICD-10-CM

## 2024-06-06 DIAGNOSIS — E66813 Obesity, class 3: Secondary | ICD-10-CM

## 2024-06-06 DIAGNOSIS — J3089 Other allergic rhinitis: Secondary | ICD-10-CM

## 2024-06-06 DIAGNOSIS — E890 Postprocedural hypothyroidism: Secondary | ICD-10-CM | POA: Diagnosis not present

## 2024-06-06 DIAGNOSIS — E1165 Type 2 diabetes mellitus with hyperglycemia: Secondary | ICD-10-CM | POA: Diagnosis not present

## 2024-06-06 DIAGNOSIS — J302 Other seasonal allergic rhinitis: Secondary | ICD-10-CM

## 2024-06-06 LAB — BASIC METABOLIC PANEL WITH GFR
BUN: 9 mg/dL (ref 6–23)
CO2: 27 meq/L (ref 19–32)
Calcium: 9.6 mg/dL (ref 8.4–10.5)
Chloride: 99 meq/L (ref 96–112)
Creatinine, Ser: 0.76 mg/dL (ref 0.40–1.20)
GFR: 92.76 mL/min (ref >60.00)
Glucose, Bld: 234 mg/dL — ABNORMAL HIGH (ref 70–99)
Potassium: 4.1 meq/L (ref 3.5–5.1)
Sodium: 138 meq/L (ref 135–145)

## 2024-06-06 LAB — POCT GLYCOSYLATED HEMOGLOBIN (HGB A1C): Hemoglobin A1C: 7.5 % — AB (ref 4.0–5.6)

## 2024-06-06 LAB — TSH: TSH: 1.4 u[IU]/mL (ref 0.35–5.50)

## 2024-06-06 LAB — MAGNESIUM: Magnesium: 1.8 mg/dL (ref 1.5–2.5)

## 2024-06-06 NOTE — Assessment & Plan Note (Signed)
 She experiences chronic allergic rhinitis with seasonal exacerbations, using Flonase  and Zyrtec . Increase Zyrtec  to twice daily if needed. Consider Afrin nasal spray for temporary relief, not exceeding 3 days. Continue Flonase  as needed.

## 2024-06-06 NOTE — Progress Notes (Signed)
 Established Patient Office Visit  Subjective   Patient ID: Heather Fry, female    DOB: 1976/01/30  Age: 48 y.o. MRN: [id number]  Chief Complaint  Patient presents with   hypomagnesemia    6 week recheck   Head cold as well and was seen in urgent care for this but is not getting better. Finishing antibiotics for ears     HPI  Discussed the use of AI scribe software for clinical note transcription with the patient, who gave verbal consent to proceed.  History of Present Illness Heather Fry is a 48 year old female who presents for a six-week follow-up after starting Premarin  estrogen replacement therapy.  She did not begin Premarin  due to a viral illness that she and her daughter experienced. Her night sweats, initially attributed to menopause, were later associated with the viral illness.  She is currently experiencing symptoms of a head cold, which she attributes to allergies, particularly ragweed. She reports heat intolerance and uses air conditioning year-round. She recently completed a seven-day course of antibiotics for a right ear infection, which followed her daughter's double ear infection. She reports improvement in ear symptoms but continues to experience coughing and rhinorrhea, which disrupts her sleep, especially while using a CPAP machine. She uses Flonase  and Zyrtec  daily for her allergies and inquires about switching to Claritin . She has not used Afrin due to concerns about overuse.  She has a history of diabetes and uses a Dexcom G7 for glucose monitoring. Her A1c was stable at 7.5% as of June. She takes acarbose  and glimepiride , with glimepiride  taken three to four times daily before meals. She experiences the dawn phenomenon, with elevated blood sugars in the morning, but does not experience hypoglycemia. She prefers to manage her diabetes with her current regimen, avoiding metformin due to past issues.  She has a history of hypothyroidism. She has a history of  medication-induced prolonged QT interval, which she monitors closely. Her weight is stable, fluctuating between 265 to 273 pounds, and she engages in chair fit exercises for heart health.  She experiences recurrent ear infections, particularly in the fall. She also has a heel spur and has been advised to limit walking to prevent worsening of the condition. She uses kinesiology tape for support and reports it helps with mobility during daily activities.     Objective:     BP 135/87   Pulse 92   Temp 98.8 F (37.1 C) (Oral)   Wt 272 lb (123.4 kg)   LMP 10/09/2014   SpO2 99%   BMI 48.18 kg/m   Physical Exam  Gen: Well-appearing woman Ears: Bilateral tympanic membranes are not erythematous, no effusion Heart: Regular, 2 out of 6 early systolic murmur best heard at the right upper sternal border Lungs: Unlabored, clear throughout with no crackles Ext: Warm, well-perfused, trace nonpitting edema in both lower extremities  Results for orders placed or performed in visit on 06/06/24  POCT glycosylated hemoglobin (Hb A1C)  Result Value Ref Range   Hemoglobin A1C 7.5 (A) 4.0 - 5.6 %   HbA1c POC (<> result, manual entry)     HbA1c, POC (prediabetic range)     HbA1c, POC (controlled diabetic range)        Assessment & Plan:    Problem List Items Addressed This Visit       High   Obesity, Class III, BMI 40-49.9 (morbid obesity) (Chronic)   Chronic and stable.  Weight today 272 pounds with BMI of 48.  Limited on her mobility due to arthritis in the knees and bone spurs in her ankles.  Could not tolerate GLP-1 agonists in the past.  Doing her best.      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 Check TSH levels today and continue Synthroid  137 mcg daily      Relevant Orders   TSH   Type 2 diabetes mellitus with hyperglycemia (HCC) - Primary (Chronic)   Her diabetes is  well-managed with an A1c of 7.5% and stable blood glucose levels on acarbose  and glimepiride , with no complications. Check A1c today. Continue acarbose  and glimepiride  and Lantus  15 units daily. Schedule follow-up in 3 months and monitor blood glucose levels regularly.      Relevant Orders   POCT glycosylated hemoglobin (Hb A1C) (Completed)   Basic metabolic panel with GFR     Medium    Seasonal and perennial allergic rhinitis (Chronic)   She experiences chronic allergic rhinitis with seasonal exacerbations, using Flonase  and Zyrtec . Increase Zyrtec  to twice daily if needed. Consider Afrin nasal spray for temporary relief, not exceeding 3 days. Continue Flonase  as needed.        Low   Hypomagnesemia (Chronic)   Relevant Orders   Basic metabolic panel with GFR   Magnesium     Return in about 3 months (around 09/05/2024).    Cleatus Debby Specking, MD

## 2024-06-06 NOTE — Assessment & Plan Note (Signed)
 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 Check TSH levels today and continue Synthroid  137 mcg daily

## 2024-06-06 NOTE — Assessment & Plan Note (Signed)
 Her diabetes is well-managed with an A1c of 7.5% and stable blood glucose levels on acarbose  and glimepiride , with no complications. Check A1c today. Continue acarbose  and glimepiride  and Lantus  15 units daily. Schedule follow-up in 3 months and monitor blood glucose levels regularly.

## 2024-06-06 NOTE — Patient Instructions (Signed)
  VISIT SUMMARY: Today, you came in for a follow-up appointment after starting estrogen replacement therapy. You did not start the therapy due to a viral illness. You are currently experiencing symptoms of a head cold, which you believe are due to allergies. You recently completed antibiotics for an ear infection and are still experiencing some symptoms. We discussed your diabetes management, hypothyroidism, allergies, and other health concerns.  YOUR PLAN: -TYPE 2 DIABETES MELLITUS: Type 2 diabetes is a condition where your body does not use insulin  properly, leading to high blood sugar levels. Your diabetes is well-managed with an A1c of 7.5%. Continue taking acarbose  and glimepiride  as prescribed. We will check your A1c today and schedule a follow-up in 3 months. Please continue to monitor your blood glucose levels regularly.  -POSTPROCEDURAL HYPOTHYROIDISM: Hypothyroidism is a condition where your thyroid  does not produce enough hormones. Your condition is managed in-house. We will check your TSH levels today and continue managing your thyroid  condition with your current provider.  -ALLERGIC RHINITIS: Allergic rhinitis is an allergic reaction that causes sneezing, congestion, and a runny nose. You can increase Zyrtec  to twice daily if needed. Consider using Afrin nasal spray for temporary relief, but do not use it for more than 3 days. Continue using Flonase  as needed.  -ACUTE UPPER RESPIRATORY INFECTION: An upper respiratory infection is a viral infection that affects your nose, throat, and airways. Your symptoms are improving but still present, especially at night with CPAP use. Continue supportive care and consider using Afrin for temporary relief of nasal symptoms.  -RECURRENT OTITIS MEDIA, RIGHT EAR: Otitis media is an infection or inflammation of the middle ear. You are prone to fluid buildup and infections, especially in the fall. Monitor for any new symptoms and seek medical attention if they  worsen.  -HEART MURMUR: A heart murmur is an unusual sound heard between heartbeats. Your newly identified heart murmur is likely benign and not causing any symptoms. Monitor for any new cardiac symptoms.  -MEDICINE-INDUCED PROLONGED QT INTERVAL: A prolonged QT interval is a heart rhythm condition that can cause fast, chaotic heartbeats. You have a history of this condition, so it is important to ensure all your healthcare providers are aware and to avoid medications that can prolong the QT interval.  INSTRUCTIONS: Please follow up in 3 months for your diabetes management. Continue to monitor your blood glucose levels regularly. We will check your A1c and TSH levels today. If your symptoms of the upper respiratory infection or ear infection worsen, please seek medical attention. Monitor for any new cardiac symptoms and inform all healthcare providers about your history of prolonged QT interval.

## 2024-06-06 NOTE — Assessment & Plan Note (Signed)
 Chronic and stable.  Weight today 272 pounds with BMI of 48.  Limited on her mobility due to arthritis in the knees and bone spurs in her ankles.  Could not tolerate GLP-1 agonists in the past.  Doing her best.

## 2024-06-07 ENCOUNTER — Ambulatory Visit: Payer: Self-pay | Admitting: Student in an Organized Health Care Education/Training Program

## 2024-06-08 ENCOUNTER — Telehealth: Payer: Self-pay | Admitting: Internal Medicine

## 2024-06-08 NOTE — Telephone Encounter (Signed)
 Received request for medical records from [facility] - records sent 06/08/24

## 2024-06-12 NOTE — Progress Notes (Deleted)
 Heather Fry, female    DOB: 1976/05/06    MRN: [id number]   Brief patient profile:  48  yowf  quit smoking 1999  referred to pulmonary clinic in [place]  04/13/2024 by Dr Bertell  for asthma eval with symptom onset 10/2019 w/in a year of thyroidectomy assoc with marked wt gain.     Pt not previously seen by PCCM service.     History of Present Illness  04/13/2024  Pulmonary/ 1st office eval/ Heather Fry / [place] Office no  maint rx /  medrol  4 mg x 10 for emergencies  Chief Complaint  Patient presents with   Pulmonary Consult    Referred by Dr. Lavona for eval of asthma.   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  Work on inhaler technique:    Plan B = Backup (to supplement plan A, not to replace it) Only use your albuterol  inhaler as a rescue medication  Plan C = Crisis (instead of Plan B but only if Plan B stops working) - only use your albuterol -ipatropium nebulizer if you first try Plan B  Plan D =  Medrol  4 mg as in past if ABC not working for you    Please schedule a follow up office visit in 6 weeks, call sooner if needed with all medications /inhalers/ solutions   06/13/2024  f/u ov/Heather Fry office/Heather Fry re: *** maint on *** did *** bring all meds  No chief complaint on file.   Dyspnea:  *** Cough: *** Sleeping: ***   resp cc  SABA use: *** 02: ***  Lung cancer screening: ***   No obvious day to day or daytime variability or assoc excess/ purulent sputum or mucus  plugs or hemoptysis or cp or chest tightness, subjective wheeze or overt sinus or hb symptoms.    Also denies any obvious fluctuation of symptoms with weather or environmental changes or other aggravating or alleviating factors except as outlined above   No unusual exposure hx or h/o childhood pna/ asthma or knowledge of premature birth.  Current Allergies, Complete Past Medical History, Past Surgical History, Family History, and Social History were reviewed in Heather Fry record.  ROS  The following are not active complaints unless bolded Hoarseness, sore throat, dysphagia, dental problems, itching, sneezing,  nasal congestion or discharge of excess mucus or purulent secretions, ear ache,   fever, chills, sweats, unintended wt loss or wt gain, classically pleuritic or exertional cp,  orthopnea pnd or arm/hand swelling  or leg swelling, presyncope, palpitations, abdominal pain, anorexia, nausea, vomiting, diarrhea  or change in bowel habits or change in bladder habits, change in stools or change in urine, dysuria, hematuria,  rash, arthralgias, visual complaints, headache, numbness, weakness or ataxia or problems with walking or coordination,  change in mood or  memory.        No outpatient medications  have been marked as taking for the 06/13/24 encounter (Appointment) with Heather Heather NOVAK, MD.            Past Medical History:  Diagnosis Date   Allergic asthma    Anemia    Anxiety    Arthritis    Constipation    Depression    Diabetes mellitus without complication (HCC)    Diverticulosis    Fatty liver    moderate to severe   Fatty liver    GERD (gastroesophageal reflux disease)    Graves disease    Headache    tension, ocular migraines   High cholesterol    Hypothyroidism    Internal hemorrhoids    Osteoarthritis    Prolonged Q-T interval on ECG    Sleep apnea    Stomach ulcer    Thyroid  disease    Uterine fibroid    Vitamin D  deficiency        Objective:      wts  06/13/2024        ***   04/13/24 265 lb 6.4 oz (120.4 kg)  03/27/24 271 lb (122.9 kg)  03/24/24 272 lb (123.4 kg)   Vital signs reviewed  06/13/2024  - Note at rest 02 sats  ***% on ***   General appearance:    ***           Assessment

## 2024-06-13 ENCOUNTER — Encounter: Payer: Self-pay | Admitting: Internal Medicine

## 2024-06-13 ENCOUNTER — Ambulatory Visit: Admitting: Internal Medicine

## 2024-06-13 DIAGNOSIS — J45991 Cough variant asthma: Secondary | ICD-10-CM

## 2024-06-17 ENCOUNTER — Encounter: Payer: Self-pay | Admitting: Student in an Organized Health Care Education/Training Program

## 2024-06-17 ENCOUNTER — Ambulatory Visit (INDEPENDENT_AMBULATORY_CARE_PROVIDER_SITE_OTHER): Admitting: Student in an Organized Health Care Education/Training Program

## 2024-06-17 VITALS — BP 150/93 | HR 79 | Temp 99.8°F | Wt 272.4 lb

## 2024-06-17 DIAGNOSIS — J302 Other seasonal allergic rhinitis: Secondary | ICD-10-CM

## 2024-06-17 DIAGNOSIS — J4540 Moderate persistent asthma, uncomplicated: Secondary | ICD-10-CM | POA: Diagnosis not present

## 2024-06-17 DIAGNOSIS — J3089 Other allergic rhinitis: Secondary | ICD-10-CM | POA: Diagnosis not present

## 2024-06-17 DIAGNOSIS — E1165 Type 2 diabetes mellitus with hyperglycemia: Secondary | ICD-10-CM

## 2024-06-17 DIAGNOSIS — Z794 Long term (current) use of insulin: Secondary | ICD-10-CM

## 2024-06-17 MED ORDER — IPRATROPIUM BROMIDE 0.02 % IN SOLN: 0.5000 mg | Freq: Four times a day (QID) | RESPIRATORY_TRACT | 1 refills | Status: DC | PRN

## 2024-06-17 MED ORDER — GLIMEPIRIDE 1 MG PO TABS
1.0000 mg | ORAL_TABLET | Freq: Three times a day (TID) | ORAL | 3 refills | Status: AC
Start: 2024-06-17 — End: ?

## 2024-06-17 NOTE — Assessment & Plan Note (Signed)
 Severe allergic symptoms occur during peak season, managed with Flonase  and Zyrtec . Symptoms suggest allergic bronchitis. Increase Zyrtec  to twice daily for two weeks, with potential for increased drowsiness. Continue Flonase  daily and monitor for increased drowsiness.

## 2024-06-17 NOTE — Progress Notes (Signed)
 Acute Office Visit  Subjective:     Patient ID: Heather Fry, female    DOB: 10-19-1975, 48 y.o.   MRN: [id number]  Chief Complaint  Patient presents with   Asthma    Check sinuses. Glimepiride  1mg  pt states that the instructions are written wrong for how many times she's supposed to take it.    HPI  Discussed the use of AI scribe software for clinical note transcription with the patient, who gave verbal consent to proceed.  History of Present Illness Heather Fry is a 48 year old female with asthma and allergies who presents with respiratory symptoms and chest pain.  She has been experiencing exacerbations of her asthma, with a significant episode yesterday that nearly led to an ER visit due to severe chest pain. Relief was achieved with a breathing treatment. She frequently experiences costochondritis, which she believes contributes to her chest pain. She recalls being told at a previous visit that a heart murmur was heard.  She describes ongoing respiratory symptoms, including both productive and non-productive cough, nasal congestion, and drainage. She has a history of ear infections and is concerned about the possibility of bronchitis or an asthma flare. She experiences a mild sore throat, dry and stuffy nasal passages, and dizziness, which she attributes to her allergies. She has been using Flonase  and Zyrtec  for her allergies, taking Zyrtec  once daily, and recently took a 4 mg dose of methylprednisolone  to manage severe itching.  She has been waking up with a dry mouth and has switched back to a heated CPAP hose to alleviate this. Her allergies are particularly severe this time of year, which she finds frustrating as it is her favorite season. She has been experiencing headaches and sweating, which she associates with her body's response to illness.  She is currently taking glimepiride , which was prescribed incorrectly by her endocrinologist, leading to issues with her  medication supply. She is supposed to take it three to four times a day, but it was prescribed as once daily. She also requests a renewal of her ipratropium bromide  solution for her nebulizer, as her current supply is old.     Objective:    BP (!) 150/93   Pulse 79   Temp 99.8 F (37.7 C) (Temporal)   Wt 272 lb 6.4 oz (123.6 kg)   LMP 10/09/2014   SpO2 97%   BMI 48.25 kg/m   Physical Exam  Gen: Well-appearing woman Heart: Regular, 2 out of 6 early stock murmur best heard at the left upper sternal border Ears: Bilateral tympanic membranes are normal, no erythema or effusion Neck: No tender lymphadenopathy Lungs: Unlabored, clear throughout, no wheezing or crackles     Assessment & Plan:    Problem List Items Addressed This Visit       High   Moderate persistent asthma, uncomplicated - Primary (Chronic)   She experienced a recent exacerbation with severe chest pain, which improved with a breathing treatment. Absence of wheezing or crackles suggests no bronchitis or pneumonia, likely triggered by environmental factors or a viral infection. Renewed ipratropium bromide  solution for nebulizer use. Advised rest and symptom monitoring. Instructed to return if symptoms worsen.      Relevant Medications   ipratropium (ATROVENT ) 0.02 % nebulizer solution   Type 2 diabetes mellitus with hyperglycemia (HCC) (Chronic)   Relevant Medications   glimepiride  (AMARYL ) 1 MG tablet     Medium    Seasonal and perennial allergic rhinitis (Chronic)   Severe allergic  symptoms occur during peak season, managed with Flonase  and Zyrtec . Symptoms suggest allergic bronchitis. Increase Zyrtec  to twice daily for two weeks, with potential for increased drowsiness. Continue Flonase  daily and monitor for increased drowsiness.       Meds ordered this encounter  Medications   glimepiride  (AMARYL ) 1 MG tablet    Sig: Take 1 tablet (1 mg total) by mouth in the morning, at noon, and at bedtime.     Dispense:  270 tablet    Refill:  3   ipratropium (ATROVENT ) 0.02 % nebulizer solution    Sig: Take 2.5 mLs (0.5 mg total) by nebulization every 6 (six) hours as needed for wheezing or shortness of breath.    Dispense:  75 mL    Refill:  1    No follow-ups on file.  Cleatus Debby Specking, MD

## 2024-06-17 NOTE — Patient Instructions (Signed)
  VISIT SUMMARY: Today, you were seen for respiratory symptoms and chest pain related to your asthma and allergies. You experienced a significant asthma exacerbation recently, which was relieved with a breathing treatment. You also have ongoing symptoms of nasal congestion, cough, and dizziness, which you attribute to your allergies. Additionally, we discussed your diabetes medication and a previously noted heart murmur.  YOUR PLAN: -ASTHMA EXACERBATION: An asthma exacerbation is a worsening of asthma symptoms. You had a severe episode recently that was relieved with a breathing treatment. We will renew your ipratropium bromide  solution for your nebulizer. Please rest and monitor your symptoms, and return if they worsen.  -SEASONAL ALLERGIC RHINITIS: Seasonal allergic rhinitis is an allergic reaction to pollen and other allergens that occur during certain times of the year. Your symptoms are severe this season. Continue using Flonase  daily and increase Zyrtec  to twice daily for two weeks. Be aware that this may cause increased drowsiness.  -TYPE 2 DIABETES MELLITUS WITH HYPERGLYCEMIA: Type 2 diabetes mellitus with hyperglycemia is a condition where your blood sugar levels are too high. Your current glimepiride  prescription is incorrect, which is causing issues with your medication supply. We will contact your endocrinologist to correct the prescription so you can take it three to four times daily as needed.  -HEART MURMUR: A heart murmur is an unusual sound heard between heartbeats. You have a slight heart murmur, which is not a cause for concern and does not affect your blood pressure. We will continue to monitor it.  INSTRUCTIONS: Please follow up if your asthma symptoms worsen or if you experience any new symptoms. We will contact your endocrinologist to correct your glimepiride  prescription. Continue using Flonase  daily and increase Zyrtec  to twice daily for two weeks. Monitor for increased  drowsiness. Rest and monitor your asthma symptoms, and return if they worsen.

## 2024-06-17 NOTE — Assessment & Plan Note (Signed)
 She experienced a recent exacerbation with severe chest pain, which improved with a breathing treatment. Absence of wheezing or crackles suggests no bronchitis or pneumonia, likely triggered by environmental factors or a viral infection. Renewed ipratropium bromide  solution for nebulizer use. Advised rest and symptom monitoring. Instructed to return if symptoms worsen.

## 2024-06-22 ENCOUNTER — Encounter: Payer: Self-pay | Admitting: Student in an Organized Health Care Education/Training Program

## 2024-06-23 ENCOUNTER — Ambulatory Visit (HOSPITAL_COMMUNITY)
Admission: RE | Admit: 2024-06-23 | Discharge: 2024-06-23 | Disposition: A | Discharge status: Home / Self Care | Source: Ambulatory Visit | Attending: Vascular Surgery | Admitting: Vascular Surgery

## 2024-06-23 ENCOUNTER — Other Ambulatory Visit (HOSPITAL_COMMUNITY): Payer: Self-pay | Admitting: Family Medicine

## 2024-06-23 DIAGNOSIS — M79605 Pain in left leg: Secondary | ICD-10-CM

## 2024-07-03 NOTE — Progress Notes (Unsigned)
  Cardiology Office Note:   Date:  07/06/2024  ID:  Heather Fry, DOB 1975-10-01, MRN [id number] PCP: Jerrell Cleatus Ned, MD  [facility] Providers Cardiologist:  Lynwood Schilling, MD {  History of Present Illness:   Heather Fry is a 48 y.o. female  who presents for evaluation of chest pain.  She was in the ED in Nov  2022 for this.  She had a  Lexiscan  Myoview  on 10/13/2019, which showed normal perfusion, LVEF 63%.  TTE on 10/19/2019 showed normal LVEF, normal RV function, no significant valvular disease.     Since I last saw her she said she has become perimenopausal and she started to have hot flashes.  Her physician wanted to start hormone replacement therapy but she wanted to check with me first.  She also said that she had some lower extremity discomfort.  She says she had a venous Doppler and I did look this up.  There was no evidence of DVT.  Previously I sent her to [facility].  She said she could not afford the food or therapy they were suggesting.  She gets around very slowly because of knee problems.  She is trying to lose weight to have knee surgery.  She was in a wheelchair.  She has had no new chest discomfort, neck or arm discomfort.  She has had no new palpitations, presyncope or syncope.  ROS: As stated in the HPI and negative for all other systems.  Studies Reviewed:    EKG:     NA  Risk Assessment/Calculations:             Physical Exam:   VS:  BP (!) 155/80   Pulse 85   Ht 5' 3 (1.6 m)   Wt 270 lb (122.5 kg)   LMP 10/09/2014   BMI 47.83 kg/m    Wt Readings from Last 3 Encounters:  07/06/24 270 lb (122.5 kg)  06/17/24 272 lb 6.4 oz (123.6 kg)  06/06/24 272 lb (123.4 kg)     GEN: Well nourished, well developed in no acute distress NECK: No JVD; No carotid bruits CARDIAC: RRR, no murmurs, rubs, gallops RESPIRATORY:  Clear to auscultation without rales, wheezing or rhonchi  ABDOMEN: Soft, non-tender,  non-distended EXTREMITIES:  No edema; No deformity   ASSESSMENT AND PLAN:   HTN:   Blood pressure is elevated today but she reports that it is always in the 120s over 70s at home and she has a good blood pressure monitor.  Therefore, no change in therapy.   Perimenopausal: I see no contraindication to Premarin  as suggested   sleep apnea:   She uses CPAP.  No change in therapy.  QT prolonged:   This has been intermittent on EKGs.  She is cautioned about QT prolonging meds but no further workup is indicated.     Follow up with me in 1 year at her request.  Signed, Lynwood Schilling, MD

## 2024-07-06 ENCOUNTER — Ambulatory Visit (INDEPENDENT_AMBULATORY_CARE_PROVIDER_SITE_OTHER): Admitting: Cardiology

## 2024-07-06 ENCOUNTER — Encounter: Payer: Self-pay | Admitting: Cardiology

## 2024-07-06 VITALS — BP 155/80 | HR 85 | Ht 63.0 in | Wt 270.0 lb

## 2024-07-06 DIAGNOSIS — R072 Precordial pain: Secondary | ICD-10-CM

## 2024-07-06 DIAGNOSIS — I10 Essential (primary) hypertension: Secondary | ICD-10-CM | POA: Diagnosis not present

## 2024-07-06 DIAGNOSIS — R9431 Abnormal electrocardiogram [ECG] [EKG]: Secondary | ICD-10-CM

## 2024-07-06 NOTE — Patient Instructions (Addendum)

## 2024-07-25 ENCOUNTER — Other Ambulatory Visit: Payer: Self-pay | Admitting: Student in an Organized Health Care Education/Training Program

## 2024-07-25 MED ORDER — ROSUVASTATIN CALCIUM 10 MG PO TABS: ORAL_TABLET | ORAL | 3 refills | Status: AC

## 2024-08-16 ENCOUNTER — Other Ambulatory Visit: Payer: Self-pay | Admitting: Student in an Organized Health Care Education/Training Program

## 2024-08-16 DIAGNOSIS — J4540 Moderate persistent asthma, uncomplicated: Secondary | ICD-10-CM

## 2024-08-18 ENCOUNTER — Other Ambulatory Visit: Payer: Self-pay | Admitting: Student in an Organized Health Care Education/Training Program

## 2024-08-18 MED ORDER — ALBUTEROL SULFATE HFA 108 (90 BASE) MCG/ACT IN AERS: 2.0000 | INHALATION_SPRAY | Freq: Four times a day (QID) | RESPIRATORY_TRACT | 11 refills | Status: AC | PRN

## 2024-08-18 NOTE — Telephone Encounter (Signed)
 Copied from CRM #[id number]. Topic: Clinical - Medication Refill >> Aug 18, 2024  2:12 PM Suzen RAMAN wrote: Medication: albuterol  (VENTOLIN  HFA) 108 (90 Base) MCG/ACT inhaler   Has the patient contacted their pharmacy? Yes  This is the patient's preferred pharmacy:  [facility] - [place], [place] - [address] [place] [address] Phone: [phone] Fax: [phone]  Is this the correct pharmacy for this prescription? Yes If no, delete pharmacy and type the correct one.   Has the prescription been filled recently? No  Is the patient out of the medication? Yes  Has the patient been seen for an appointment in the last year OR does the patient have an upcoming appointment? Yes  Can we respond through MyChart? Yes  Agent: Please be advised that Rx refills may take up to 3 business days. We ask that you follow-up with your pharmacy.

## 2024-09-06 ENCOUNTER — Ambulatory Visit: Admitting: Student in an Organized Health Care Education/Training Program

## 2024-09-07 ENCOUNTER — Emergency Department (HOSPITAL_COMMUNITY)
Admission: EM | Admit: 2024-09-07 | Discharge: 2024-09-08 | Disposition: A | Discharge status: Home / Self Care | Attending: Emergency Medicine | Admitting: Emergency Medicine

## 2024-09-07 ENCOUNTER — Encounter (HOSPITAL_COMMUNITY): Payer: Self-pay

## 2024-09-07 ENCOUNTER — Other Ambulatory Visit: Payer: Self-pay

## 2024-09-07 ENCOUNTER — Ambulatory Visit: Admitting: Student in an Organized Health Care Education/Training Program

## 2024-09-07 VITALS — BP 153/99 | HR 88 | Wt 268.0 lb

## 2024-09-07 DIAGNOSIS — R0981 Nasal congestion: Secondary | ICD-10-CM | POA: Diagnosis present

## 2024-09-07 DIAGNOSIS — E890 Postprocedural hypothyroidism: Secondary | ICD-10-CM | POA: Diagnosis not present

## 2024-09-07 DIAGNOSIS — E1165 Type 2 diabetes mellitus with hyperglycemia: Secondary | ICD-10-CM

## 2024-09-07 DIAGNOSIS — Z794 Long term (current) use of insulin: Secondary | ICD-10-CM

## 2024-09-07 DIAGNOSIS — R531 Weakness: Secondary | ICD-10-CM

## 2024-09-07 DIAGNOSIS — L905 Scar conditions and fibrosis of skin: Secondary | ICD-10-CM | POA: Diagnosis not present

## 2024-09-07 DIAGNOSIS — B349 Viral infection, unspecified: Secondary | ICD-10-CM | POA: Diagnosis not present

## 2024-09-07 DIAGNOSIS — E1169 Type 2 diabetes mellitus with other specified complication: Secondary | ICD-10-CM

## 2024-09-07 DIAGNOSIS — E785 Hyperlipidemia, unspecified: Secondary | ICD-10-CM

## 2024-09-07 LAB — LIPID PANEL
Cholesterol: 151 mg/dL (ref 0–200)
HDL: 41.9 mg/dL (ref >39.00)
LDL Cholesterol: 68 mg/dL (ref 0–99)
NonHDL: 108.99
Total CHOL/HDL Ratio: 4
Triglycerides: 203 mg/dL — ABNORMAL HIGH (ref 0.0–149.0)
VLDL: 40.6 mg/dL — ABNORMAL HIGH (ref 0.0–40.0)

## 2024-09-07 LAB — TSH: TSH: 1.15 u[IU]/mL (ref 0.35–5.50)

## 2024-09-07 LAB — HEMOGLOBIN A1C: Hgb A1c MFr Bld: 7.6 % — ABNORMAL HIGH (ref 4.6–6.5)

## 2024-09-07 MED ORDER — TIRZEPATIDE 2.5 MG/0.5ML ~~LOC~~ SOAJ: 2.5000 mg | SUBCUTANEOUS | 2 refills | Status: DC

## 2024-09-07 NOTE — Assessment & Plan Note (Signed)
 Chronic and stable.  No hypo or hyperthyroid symptoms.  Will check TSH today and continue with Synthroid  137 mcg daily.

## 2024-09-07 NOTE — Assessment & Plan Note (Addendum)
 Chronic and not well-controlled.  I reviewed Dexcom data today which shows postprandial hyperglycemia, average blood sugars around 150 with estimated A1c 7.7%.  Managed with glimepiride  and acarbose .  She stopped using Lantus  on her own about a month ago because of concerns of hypoglycemia. Mounjaro  is considered for weight and glycemic control, struggle to tolerate Ozempic last year due to nausea.  A1c is still above 7% on 2 oral medications, so I think trying Mounjaro  would be very helpful. Prescribed Mounjaro  2.5 mg to minimize nausea. Continue glimepiride  and acarbose  until Mounjaro 's efficacy is assessed. Blood work ordered for A1c, thyroid  function, and cholesterol. Reassess diabetes management in 3 months with A1c and Dexcom data. Discuss potential discontinuation of glimepiride  or acarbose  if A1c is below 7% and weight loss is achieved.

## 2024-09-07 NOTE — Assessment & Plan Note (Signed)
 Patient with a long history of acne that has left her with scarring on her face, and still has active comedones and inflammation on her back.  She is requesting referral to dermatology to consider treatment options for the scarring on her face which I think is reasonable.

## 2024-09-07 NOTE — ED Triage Notes (Signed)
 Pt to Ed with c/o feeling dehydrated pt says she has drank Pedialyte but still feels off, like she is dehydrated, pt saw Dr today, but he didn't check them.

## 2024-09-07 NOTE — Assessment & Plan Note (Signed)
 Chronic and stable.  Tolerating using rosuvastatin  3 times a week.  This is primary prevention.  Will check lipids today, goal LDL less than 100.

## 2024-09-07 NOTE — Patient Instructions (Signed)
°  VISIT SUMMARY: Today, we discussed your asthma flare-up, elevated blood pressure, diabetes management, and other health concerns. We reviewed your current medications and made some adjustments to better manage your conditions. We also talked about your allergies, gastrointestinal issues, and skin concerns.  YOUR PLAN: -TYPE 2 DIABETES MELLITUS WITH HYPERGLYCEMIA AND HYPERLIPIDEMIA: Type 2 diabetes is a condition where your body does not use insulin  properly, leading to high blood sugar levels. We are managing this with glimepiride  and acarbose . We have prescribed Mounjaro  2.5 mg to help with weight and blood sugar control, and you should continue your current medications until we see how Mounjaro  works for you. We have ordered blood work to check your A1c, thyroid  function, and cholesterol levels. We will reassess your diabetes management in 3 months.  -MODERATE PERSISTENT ASTHMA: Asthma is a condition that affects your airways and makes it hard to breathe. Your current flare-up is likely due to allergies. Continue using your inhaler as needed and start using Flonase  and saline spray to help with your allergy  symptoms.  -SEASONAL AND PERENNIAL ALLERGIC RHINITIS: Allergic rhinitis is inflammation of the inside of your nose caused by allergens like dust and dander. This is causing significant drainage and congestion. Continue using Flonase  and saline spray to manage these symptoms.  -GENERAL HEALTH MAINTENANCE: You are engaging in regular exercise, which is great for your overall health. We discussed the benefits of Mounjaro  for weight and diabetes control. You are also interested in seeing a dermatologist for your acne scars, and we have referred you for an evaluation. Keep up with your regular exercise and adjust the pace as needed.  INSTRUCTIONS: Please follow up in 3 months to reassess your diabetes management with A1c and Dexcom data. Continue with your current medications and start Mounjaro  as  prescribed. Use Flonase  and saline spray for your allergy  symptoms. We have referred you to a dermatologist for your acne scars. Make sure to get your blood work done for A1c, thyroid  function, and cholesterol levels.

## 2024-09-07 NOTE — Progress Notes (Signed)
 Established Patient Office Visit  Patient ID: Heather Fry, female    DOB: 11/17/1975  Age: 48 y.o. MRN: [id number] PCP: Jerrell Cleatus Ned, MD  Chief Complaint  Patient presents with   Medical Management of Chronic Issues    6 month follow up and would like to discuss medications    Subjective:     HPI  Discussed the use of AI scribe software for clinical note transcription with the patient, who gave verbal consent to proceed.  History of Present Illness Heather Fry is a 48 year old female with asthma and diabetes who presents with an asthma flare and elevated blood pressure.  She is experiencing an asthma flare with poor breathing and elevated blood pressure. She has used her inhaler twice and plans to use her nebulizer. Her symptoms are exacerbated by significant drainage due to allergies, particularly from dust and dander. She has prednisone  at home but prefers to avoid it due to side effects, including elevated blood pressure from injections.  She has a history of diabetes and is currently taking glimepiride  and acarbose . She stopped using insulin  over a month ago due to hypoglycemia, with blood sugars dropping to 90-100 mg/dL, causing her to feel unwell. She uses a Dexcom G7 for continuous glucose monitoring and reports that her blood sugars stay in range when she is active. She previously tried Synjardy  but experienced prolonged hypoglycemia. She prefers her current regimen as it does not affect her stomach, unlike metformin.  She has a history of gastrointestinal issues, including gastritis, which causes discomfort when flaring. She previously tried Ozempic but experienced severe nausea and discomfort, describing it as 'a cannonball sitting in the top of my gut.'  She is concerned about acne scarring and is interested in seeing a dermatologist for treatment. She has been using a back brush to help clear her skin.  She has been increasing her physical activity,  walking instead of using a cart, and using a walk pad at home. She experiences swelling in her lower legs, which has improved with increased activity. She uses compression stockings.  She has a history of Graves' disease and is currently without a thyroid , impacting her metabolism and weight management efforts. She takes Crestor  three times a week for cholesterol management.  She reports feeling tired due to poor sleep and increased nasal congestion, which she attributes to allergies. She uses saline spray and plans to use Flonase  to manage her symptoms.     ROS    Objective:     BP (!) 153/99   Pulse 88   Wt 268 lb (121.6 kg)   LMP 10/09/2014   BMI 47.47 kg/m   Physical Exam  Gen: Well-appearing woman Neck: Normal thyroid , no nodules or adenopathy Heart: Regular, 2 out of 6 early stock murmur best heard at the left upper sternal border Lungs: Unlabored, clear throughout Ext: Warm, no edema Skin: Moderate burden of scarring on her face from prior acne, moderate active comedones and inflammatory cysts on her back    Assessment & Plan:   Problem List Items Addressed This Visit       High   Postoperative hypothyroidism (Chronic)   Chronic and stable.  No hypo or hyperthyroid symptoms.  Will check TSH today and continue with Synthroid  137 mcg daily.      Relevant Orders   TSH   Type 2 diabetes mellitus with hyperglycemia (HCC) - Primary (Chronic)   Chronic and not well-controlled.  I reviewed Dexcom data today  which shows postprandial hyperglycemia, average blood sugars around 150 with estimated A1c 7.7%.  Managed with glimepiride  and acarbose .  She stopped using Lantus  on her own about a month ago because of concerns of hypoglycemia. Mounjaro  is considered for weight and glycemic control, struggle to tolerate Ozempic last year due to nausea.  A1c is still above 7% on 2 oral medications, so I think trying Mounjaro  would be very helpful. Prescribed Mounjaro  2.5 mg to minimize  nausea. Continue glimepiride  and acarbose  until Mounjaro 's efficacy is assessed. Blood work ordered for A1c, thyroid  function, and cholesterol. Reassess diabetes management in 3 months with A1c and Dexcom data. Discuss potential discontinuation of glimepiride  or acarbose  if A1c is below 7% and weight loss is achieved.      Relevant Medications   tirzepatide  (MOUNJARO ) 2.5 MG/0.5ML Pen   Other Relevant Orders   POCT glycosylated hemoglobin (Hb A1C)   Hemoglobin A1c     Medium    Hyperlipidemia associated with type 2 diabetes mellitus (HCC) (Chronic)   Chronic and stable.  Tolerating using rosuvastatin  3 times a week.  This is primary prevention.  Will check lipids today, goal LDL less than 100.      Relevant Medications   tirzepatide  (MOUNJARO ) 2.5 MG/0.5ML Pen   Other Relevant Orders   Lipid panel     Unprioritized   Acne scarring   Patient with a long history of acne that has left her with scarring on her face, and still has active comedones and inflammation on her back.  She is requesting referral to dermatology to consider treatment options for the scarring on her face which I think is reasonable.      Relevant Orders   Ambulatory referral to Dermatology     Return in about 3 months (around 12/06/2024).    Cleatus Debby Specking, MD [facility] at [facility]

## 2024-09-08 ENCOUNTER — Ambulatory Visit: Payer: Self-pay | Admitting: Student in an Organized Health Care Education/Training Program

## 2024-09-08 LAB — CBC WITH DIFFERENTIAL/PLATELET
Abs Immature Granulocytes: 0.03 K/uL (ref 0.00–0.07)
Basophils Absolute: 0 K/uL (ref 0.0–0.1)
Basophils Relative: 0 %
Eosinophils Absolute: 0.1 K/uL (ref 0.0–0.5)
Eosinophils Relative: 1 %
HCT: 41.2 % (ref 36.0–46.0)
Hemoglobin: 13.6 g/dL (ref 12.0–15.0)
Immature Granulocytes: 0 %
Lymphocytes Relative: 24 %
Lymphs Abs: 2.3 K/uL (ref 0.7–4.0)
MCH: 28.3 pg (ref 26.0–34.0)
MCHC: 33 g/dL (ref 30.0–36.0)
MCV: 85.8 fL (ref 80.0–100.0)
Monocytes Absolute: 0.4 K/uL (ref 0.1–1.0)
Monocytes Relative: 4 %
Neutro Abs: 6.7 K/uL (ref 1.7–7.7)
Neutrophils Relative %: 71 %
Platelets: 273 K/uL (ref 150–400)
RBC: 4.8 MIL/uL (ref 3.87–5.11)
RDW: 14.5 % (ref 11.5–15.5)
WBC: 9.5 K/uL (ref 4.0–10.5)
nRBC: 0 % (ref 0.0–0.2)

## 2024-09-08 LAB — BASIC METABOLIC PANEL WITH GFR
Anion gap: 10 (ref 5–15)
BUN: 8 mg/dL (ref 6–20)
CO2: 28 mmol/L (ref 22–32)
Calcium: 9.4 mg/dL (ref 8.9–10.3)
Chloride: 102 mmol/L (ref 98–111)
Creatinine, Ser: 0.66 mg/dL (ref 0.44–1.00)
GFR, Estimated: >60 mL/min (ref >60)
Glucose, Bld: 131 mg/dL — ABNORMAL HIGH (ref 70–99)
Potassium: 3.7 mmol/L (ref 3.5–5.1)
Sodium: 140 mmol/L (ref 135–145)

## 2024-09-08 NOTE — ED Provider Notes (Signed)
 [place] EMERGENCY DEPARTMENT AT [facility]  Provider Note  CSN: [id number] Arrival date & time: 09/07/24 2110  History Chief Complaint  Patient presents with   Weakness    Heather Fry is a 48 y.o. female reports she had PCP office visit this morning, during the day she began to feel weak like she had no energy. She has had some recent nasal congestion but she has not had a fever. She reports some stomach discomfort consistent with prior gastritis, also felt some tingling in hands and face so she took some calcium  and drank an electrolyte drink. She is concerned that her electrolytes are abnormal. PCP checked A1C, Lipids and TSH, but did not do chemistry panel. Daughter at bedside has also had some similar symptoms, so may be a viral process.    Home Medications Prior to Admission medications   Medication Sig Start Date End Date Taking? Authorizing Provider  acarbose  (PRECOSE ) 100 MG tablet Take 1 tablet (100 mg total) by mouth 3 (three) times daily with meals. 03/03/24   Thapa, Sudan, MD  albuterol  (VENTOLIN  HFA) 108 (90 Base) MCG/ACT inhaler Inhale 2 puffs into the lungs every 6 (six) hours as needed. 08/18/24   Jerrell Cleatus Ned, MD  cetirizine  (ZYRTEC ) 10 MG tablet Take 10 mg by mouth daily.    [provider]  Continuous Glucose Sensor (DEXCOM G7 SENSOR) MISC as directed. 09/04/24   [provider]  EPINEPHrine  0.3 mg/0.3 mL IJ SOAJ injection Inject 0.3 mg into the muscle as needed for anaphylaxis. 07/02/22   Iva Marty Saltness, MD  EQUATE STOOL SOFTENER 100 MG capsule Take 100 mg by mouth 2 (two) times daily as needed for moderate constipation. 08/21/21   [provider]  escitalopram  (LEXAPRO ) 20 MG tablet Take 1 tablet (20 mg total) by mouth at bedtime. 01/26/19   Kassie Mallick, MD  fluticasone  (FLONASE ) 50 MCG/ACT nasal spray Place 2 sprays into both nostrils at bedtime as needed for allergies.    [provider]   glimepiride  (AMARYL ) 1 MG tablet Take 1 tablet (1 mg total) by mouth in the morning, at noon, and at bedtime. 06/17/24   Jerrell Cleatus Ned, MD  glucose blood (ACCU-CHEK AVIVA PLUS) test strip 1 each by Other route daily. And lancets 1/day 05/30/21   Kassie Mallick, MD  ipratropium (ATROVENT ) 0.02 % nebulizer solution INHALE 1 VIAL IN NEBULIZER EVERY 6 HOURS AS NEEDED FOR WHEEZING AND FOR SHORTNESS OF BREATH 08/17/24   Jerrell Cleatus Ned, MD  levothyroxine  (SYNTHROID ) 137 MCG tablet Take 137 mcg by mouth daily before breakfast.    [provider]  Magnesium  250 MG TABS Take 250 mg by mouth at bedtime.     [provider]  NON FORMULARY at bedtime. CPAP at night    [provider]  ondansetron  (ZOFRAN -ODT) 4 MG disintegrating tablet 4mg  ODT q4 hours prn nausea/vomit 03/16/24   Zammit, Joseph, MD  rosuvastatin  (CRESTOR ) 10 MG tablet Take one tablet by mouth three days weekly, on Mon, Wed, and Friday 07/30/24   Jerrell Cleatus Ned, MD  Simethicone  250 MG CAPS Take 500 mg by mouth at bedtime.    [provider]  sucralfate  (CARAFATE ) 1 GM/10ML suspension TAKE 10 ML BY MOUTH  4 TIMES DAILY AS NEEDED 06/09/22   Rudy Josette RAMAN, PA-C  tirzepatide  (MOUNJARO ) 2.5 MG/0.5ML Pen Inject 2.5 mg into the skin once a week. 09/07/24   Jerrell Cleatus Ned, MD     Allergies  Bee venom, Ketorolac  tromethamine , Hydrochlorothiazide, Levofloxacin, Levomenol, Other, Tramadol , Triamcinolone acetonide, Depo-medrol  [methylprednisolone ], Budesonide -formoterol  fumarate, Lisinopril, Losartan  potassium, Nystatin, Penicillins, and Wound dressing adhesive   Review of Systems   Review of Systems Please see HPI for pertinent positives and negatives  Physical Exam BP (!) 151/91 (BP Location: Left Arm)   Pulse 91   Temp 98.6 F (37 C) (Oral)   Resp 18   Ht 5' 3 (1.6 m)   Wt 121.6 kg   LMP 10/09/2014   SpO2 97%   BMI 47.47 kg/m   Physical Exam Vitals and nursing note  reviewed.  Constitutional:      Appearance: Normal appearance.  HENT:     Head: Normocephalic and atraumatic.     Nose: Nose normal.     Mouth/Throat:     Mouth: Mucous membranes are moist.  Eyes:     Extraocular Movements: Extraocular movements intact.     Conjunctiva/sclera: Conjunctivae normal.  Cardiovascular:     Rate and Rhythm: Normal rate.  Pulmonary:     Effort: Pulmonary effort is normal.     Breath sounds: Normal breath sounds.  Abdominal:     General: Abdomen is flat.     Palpations: Abdomen is soft.     Tenderness: There is no abdominal tenderness.  Musculoskeletal:        General: No swelling. Normal range of motion.     Cervical back: Neck supple.  Skin:    General: Skin is warm and dry.  Neurological:     General: No focal deficit present.     Mental Status: She is alert.  Psychiatric:        Mood and Affect: Mood normal.     ED Results / Procedures / Treatments   EKG None  Procedures Procedures  Medications Ordered in the ED Medications - No data to display  Initial Impression and Plan  Patient here with several nonspecific complaints, possibly from viral process, but given specific concerns for elyte abnormality, will check labs.   ED Course   Clinical Course as of 09/08/24 0054  Thu Sep 08, 2024  0035 CBC is unremarkable.  [CS]  0053 BMP is unremarkable.  [CS]    Clinical Course User Index [CS] Roselyn Carlin NOVAK, MD     MDM Rules/Calculators/A&P Medical Decision Making Problems Addressed: Generalized weakness: acute illness or injury Viral syndrome: acute illness or injury  Amount and/or Complexity of Data Reviewed Labs: ordered. Decision-making details documented in ED Course.     Final Clinical Impression(s) / ED Diagnoses Final diagnoses:  Generalized weakness  Viral syndrome    Rx / DC Orders ED Discharge Orders     None        Roselyn Carlin NOVAK, MD 09/08/24 [phone]

## 2024-09-13 ENCOUNTER — Emergency Department (HOSPITAL_COMMUNITY)

## 2024-09-13 ENCOUNTER — Other Ambulatory Visit: Payer: Self-pay

## 2024-09-13 ENCOUNTER — Ambulatory Visit: Payer: Self-pay

## 2024-09-13 ENCOUNTER — Emergency Department (HOSPITAL_COMMUNITY)
Admission: EM | Admit: 2024-09-13 | Discharge: 2024-09-13 | Disposition: A | Discharge status: Home / Self Care | Attending: Emergency Medicine | Admitting: Emergency Medicine

## 2024-09-13 ENCOUNTER — Other Ambulatory Visit: Payer: Self-pay | Admitting: Student in an Organized Health Care Education/Training Program

## 2024-09-13 ENCOUNTER — Encounter (HOSPITAL_COMMUNITY): Payer: Self-pay

## 2024-09-13 DIAGNOSIS — K5900 Constipation, unspecified: Secondary | ICD-10-CM

## 2024-09-13 DIAGNOSIS — I10 Essential (primary) hypertension: Secondary | ICD-10-CM | POA: Insufficient documentation

## 2024-09-13 DIAGNOSIS — Z7984 Long term (current) use of oral hypoglycemic drugs: Secondary | ICD-10-CM | POA: Diagnosis not present

## 2024-09-13 DIAGNOSIS — K219 Gastro-esophageal reflux disease without esophagitis: Secondary | ICD-10-CM

## 2024-09-13 DIAGNOSIS — Z79899 Other long term (current) drug therapy: Secondary | ICD-10-CM | POA: Diagnosis not present

## 2024-09-13 DIAGNOSIS — J4540 Moderate persistent asthma, uncomplicated: Secondary | ICD-10-CM

## 2024-09-13 DIAGNOSIS — E119 Type 2 diabetes mellitus without complications: Secondary | ICD-10-CM | POA: Diagnosis not present

## 2024-09-13 DIAGNOSIS — R1013 Epigastric pain: Secondary | ICD-10-CM | POA: Diagnosis present

## 2024-09-13 LAB — COMPREHENSIVE METABOLIC PANEL WITH GFR
ALT: 19 U/L (ref 0–44)
AST: 20 U/L (ref 15–41)
Albumin: 4.6 g/dL (ref 3.5–5.0)
Alkaline Phosphatase: 62 U/L (ref 38–126)
Anion gap: 15 (ref 5–15)
BUN: 10 mg/dL (ref 6–20)
CO2: 22 mmol/L (ref 22–32)
Calcium: 8.9 mg/dL (ref 8.9–10.3)
Chloride: 103 mmol/L (ref 98–111)
Creatinine, Ser: 0.64 mg/dL (ref 0.44–1.00)
GFR, Estimated: >60 mL/min (ref >60)
Glucose, Bld: 175 mg/dL — ABNORMAL HIGH (ref 70–99)
Potassium: 3.8 mmol/L (ref 3.5–5.1)
Sodium: 139 mmol/L (ref 135–145)
Total Bilirubin: 0.3 mg/dL (ref 0.0–1.2)
Total Protein: 7.5 g/dL (ref 6.5–8.1)

## 2024-09-13 LAB — CBC
HCT: 40.9 % (ref 36.0–46.0)
Hemoglobin: 13.3 g/dL (ref 12.0–15.0)
MCH: 28.4 pg (ref 26.0–34.0)
MCHC: 32.5 g/dL (ref 30.0–36.0)
MCV: 87.2 fL (ref 80.0–100.0)
Platelets: 286 K/uL (ref 150–400)
RBC: 4.69 MIL/uL (ref 3.87–5.11)
RDW: 14.6 % (ref 11.5–15.5)
WBC: 9.7 K/uL (ref 4.0–10.5)
nRBC: 0 % (ref 0.0–0.2)

## 2024-09-13 LAB — LIPASE, BLOOD: Lipase: 52 U/L — ABNORMAL HIGH (ref 11–51)

## 2024-09-13 MED ORDER — SUCRALFATE 1 G PO TABS
1.0000 g | ORAL_TABLET | Freq: Once | ORAL | Status: AC
  Administered 2024-09-13: 23:00:00 1 g via ORAL
  Filled 2024-09-13: qty 1

## 2024-09-13 MED ORDER — PANTOPRAZOLE SODIUM 40 MG PO TBEC
40.0000 mg | DELAYED_RELEASE_TABLET | Freq: Once | ORAL | Status: AC
  Administered 2024-09-13: 23:00:00 40 mg via ORAL
  Filled 2024-09-13: qty 1

## 2024-09-13 MED ORDER — PANTOPRAZOLE SODIUM 40 MG PO TBEC: 40.0000 mg | DELAYED_RELEASE_TABLET | Freq: Every day | ORAL | 0 refills | Status: AC

## 2024-09-13 NOTE — ED Triage Notes (Signed)
 Pt to ED from home with c/o abd pain x one week, although pt has chronic stomach issues, pt says it is a gastritis flare up

## 2024-09-13 NOTE — Discharge Instructions (Signed)
 Your lab test, x-ray and exam are reassuring.  You still do have some moderate stool in the right side of your colon, I suggest continuing to take your stool softener to better relieve your constipation.  I have switched you to Protonix , also continue taking your Carafate  as you are already doing.  Plan follow-up with Dr. Shaaron or Ms. Lewis as needed.

## 2024-09-13 NOTE — ED Provider Notes (Signed)
 [place] EMERGENCY DEPARTMENT AT [facility] Provider Note   CSN: [id number] Arrival date & time: 09/13/24  1857     Patient presents with: Abdominal Pain (Gastritis flare up)   Heather Fry is a 48 y.o. female   {Add pertinent medical, surgical, social history, OB history to YEP:67052} The history is provided by the patient.       Prior to Admission medications  Medication Sig Start Date End Date Taking? Authorizing Provider  acarbose  (PRECOSE ) 100 MG tablet Take 1 tablet (100 mg total) by mouth 3 (three) times daily with meals. 03/03/24   Thapa, Sudan, MD  albuterol  (VENTOLIN  HFA) 108 (90 Base) MCG/ACT inhaler Inhale 2 puffs into the lungs every 6 (six) hours as needed. 08/18/24   Jerrell Cleatus Ned, MD  cetirizine  (ZYRTEC ) 10 MG tablet Take 10 mg by mouth daily.    [provider]  Continuous Glucose Sensor (DEXCOM G7 SENSOR) MISC as directed. 09/04/24   [provider]  EPINEPHrine  0.3 mg/0.3 mL IJ SOAJ injection Inject 0.3 mg into the muscle as needed for anaphylaxis. 07/02/22   Iva Marty Saltness, MD  EQUATE STOOL SOFTENER 100 MG capsule Take 100 mg by mouth 2 (two) times daily as needed for moderate constipation. 08/21/21   [provider]  escitalopram  (LEXAPRO ) 20 MG tablet Take 1 tablet (20 mg total) by mouth at bedtime. 01/26/19   Kassie Mallick, MD  fluticasone  (FLONASE ) 50 MCG/ACT nasal spray Place 2 sprays into both nostrils at bedtime as needed for allergies.    [provider]  glimepiride  (AMARYL ) 1 MG tablet Take 1 tablet (1 mg total) by mouth in the morning, at noon, and at bedtime. 06/17/24   Jerrell Cleatus Ned, MD  glucose blood (ACCU-CHEK AVIVA PLUS) test strip 1 each by Other route daily. And lancets 1/day 05/30/21   Kassie Mallick, MD  ipratropium (ATROVENT ) 0.02 % nebulizer solution INHALE 1 VIAL IN NEBULIZER EVERY 6 HOURS AS NEEDED FOR WHEEZING AND FOR SHORTNESS OF BREATH 09/13/24   Jerrell Cleatus Ned, MD  levothyroxine  (SYNTHROID ) 137 MCG tablet Take 137 mcg by mouth daily before breakfast.    [provider]  Magnesium  250 MG TABS Take 250 mg by mouth at bedtime.     [provider]  NON FORMULARY at bedtime. CPAP at night    [provider]  ondansetron  (ZOFRAN -ODT) 4 MG disintegrating tablet 4mg  ODT q4 hours prn nausea/vomit 03/16/24   Zammit, Joseph, MD  rosuvastatin  (CRESTOR ) 10 MG tablet Take one tablet by mouth three days weekly, on Mon, Wed, and Friday 07/30/24   Jerrell Cleatus Ned, MD  Simethicone  250 MG CAPS Take 500 mg by mouth at bedtime.    [provider]  sucralfate  (CARAFATE ) 1 GM/10ML suspension TAKE 10 ML BY MOUTH  4 TIMES DAILY AS NEEDED 06/09/22   Rudy Josette RAMAN, PA-C  tirzepatide  (MOUNJARO ) 2.5 MG/0.5ML Pen Inject 2.5 mg into the skin once a week. 09/07/24   Jerrell Cleatus Ned, MD    Allergies: Bee venom, Ketorolac  tromethamine , Hydrochlorothiazide, Levofloxacin, Levomenol, Other, Tramadol , Triamcinolone acetonide, Depo-medrol  [methylprednisolone ], Budesonide -formoterol  fumarate, Lisinopril, Losartan  potassium, Nystatin, Penicillins, and Wound dressing adhesive    Review of Systems  Updated Vital Signs BP 137/71   Pulse 99   Temp 98.8 F (37.1 C) (Oral)   Resp 19   Ht 5' 3 (1.6 m)   Wt 121.6 kg   LMP 10/09/2014   SpO2 98%   BMI 47.47 kg/m   Physical Exam  (  all labs ordered are listed, but only abnormal results are displayed) Labs Reviewed  LIPASE, BLOOD - Abnormal; Notable for the following components:      Result Value   Lipase 52 (*)    All other components within normal limits  COMPREHENSIVE METABOLIC PANEL WITH GFR - Abnormal; Notable for the following components:   Glucose, Bld 175 (*)    All other components within normal limits  CBC  URINALYSIS, ROUTINE W REFLEX MICROSCOPIC    EKG: None  Radiology: No results found.  {Document cardiac monitor, telemetry assessment procedure when  appropriate:32947} Procedures   Medications Ordered in the ED - No data to display    {Click here for ABCD2, HEART and other calculators REFRESH Note before signing:1}                              Medical Decision Making Amount and/or Complexity of Data Reviewed Labs: ordered.   ***  {Document critical care time when appropriate  Document review of labs and clinical decision tools ie CHADS2VASC2, etc  Document your independent review of radiology images and any outside records  Document your discussion with family members, caretakers and with consultants  Document social determinants of health affecting pt's care  Document your decision making why or why not admission, treatments were needed:32947:::1}   Final diagnoses:  None    ED Discharge Orders     None

## 2024-09-13 NOTE — Telephone Encounter (Signed)
 FYI Only or Action Required?: FYI only for provider: ED advised.  Patient was last seen in primary care on 09/07/2024 by Jerrell Cleatus Ned, MD.  Called Nurse Triage reporting Abdominal Pain.  Symptoms began several days ago.  Interventions attempted: Prescription medications: prilosec, protonix , simethicone  and carafate .  Symptoms are: gradually worsening.  Triage Disposition: Go to ED Now (Notify PCP)  Patient/caregiver understands and will follow disposition?: Yes  Pt reporting abdominal bloating and pain in upper abdomen, mostly in the middle and to the left since. Started the end of least week. Has hx of gastritis, normally has no pain. Now 9-10/10 burning pain after eating, and when stomach is completely empty. Denies CP or SOB. Reporting lethargy. Has had severe abdominal pain occur in the past where she went to the hospital and received a GI cocktail. Reports trying to get in with her gastrologist but they are booked for the next 2 months. Hx Diabetes and obesity. Triage cut short d/t severity of pt symptoms, advised ED. Pt agreeable to go, states she feels fine to drive herself and would go now. Advised 911 for worsening symptoms.   Copied from CRM #[id number]. Topic: Clinical - Red Word Triage >> Sep 13, 2024  4:51 PM Heather Fry wrote: [facility] that prompted transfer to Nurse Triage: Patient is having gastrointestinal issues. She is having pains and burning. Reason for Disposition  [1] Pain lasts > 10 minutes AND [2] age > 39 AND [3] at least one cardiac risk factor (e.g., diabetes, high cholesterol, hypertension, obesity, smoker or strong family history of heart disease)  Answer Assessment - Initial Assessment Questions 1. LOCATION: Where does it hurt?      Upper mid abdomen, more to the left  2. RADIATION: Does the pain shoot anywhere else? (e.g., chest, back)     Triage cut short d/t severity of pt symptoms.  3. ONSET: When did the pain begin? (e.g., minutes, hours or  days ago)      End of last week  4. SUDDEN: Gradual or sudden onset?     Triage cut short d/t severity of pt symptoms.  5. PATTERN Does the pain come and go, or is it constant?     Fluctuates. Gets up to 9-10/10 after eating and when stomach is completely empty.  6. SEVERITY: How bad is the pain?  (e.g., Scale 1-10; mild, moderate, or severe)     Fluctuates. Gets up to 9-10/10 after eating and when stomach is completely empty.  7. RECURRENT SYMPTOM: Have you ever had this type of stomach pain before? If Yes, ask: When was the last time? and What happened that time?      Yes. Reports she has taken a GI cocktail in the past which helped. Triage cut short d/t severity of pt symptoms.  8. AGGRAVATING FACTORS: Does anything seem to cause this pain? (e.g., foods, stress, alcohol)     Eating and having an empty stomach.  9. CARDIAC SYMPTOMS: Do you have any of the following symptoms: chest pain, difficulty breathing, sweating, nausea?     Denies  10. OTHER SYMPTOMS: Do you have any other symptoms? (e.g., back pain, diarrhea, fever, urination pain, vomiting)       Triage cut short d/t severity of pt symptoms.  11. PREGNANCY: Is there any chance you are pregnant? When was your last menstrual period?       Denies  Protocols used: Abdominal Pain - Upper-A-AH

## 2024-09-16 ENCOUNTER — Ambulatory Visit: Admitting: Student in an Organized Health Care Education/Training Program

## 2024-09-16 ENCOUNTER — Encounter: Payer: Self-pay | Admitting: Student in an Organized Health Care Education/Training Program

## 2024-09-16 ENCOUNTER — Ambulatory Visit: Payer: Self-pay | Admitting: Student in an Organized Health Care Education/Training Program

## 2024-09-16 VITALS — BP 140/78 | HR 92 | Wt 268.0 lb

## 2024-09-16 DIAGNOSIS — K582 Mixed irritable bowel syndrome: Secondary | ICD-10-CM

## 2024-09-16 LAB — BASIC METABOLIC PANEL WITH GFR
BUN: 10 mg/dL (ref 6–23)
CO2: 28 meq/L (ref 19–32)
Calcium: 9.7 mg/dL (ref 8.4–10.5)
Chloride: 102 meq/L (ref 96–112)
Creatinine, Ser: 0.74 mg/dL (ref 0.40–1.20)
GFR: 95.58 mL/min
Glucose, Bld: 174 mg/dL — ABNORMAL HIGH (ref 70–99)
Potassium: 3.9 meq/L (ref 3.5–5.1)
Sodium: 139 meq/L (ref 135–145)

## 2024-09-16 LAB — MAGNESIUM: Magnesium: 1.9 mg/dL (ref 1.5–2.5)

## 2024-09-16 MED ORDER — LINACLOTIDE 72 MCG PO CAPS: 72.0000 ug | ORAL_CAPSULE | Freq: Every day | ORAL | 2 refills | Status: AC

## 2024-09-16 NOTE — Progress Notes (Signed)
 "  Acute Office Visit  Patient ID: Heather Fry, female    DOB: 1976-01-27, 48 y.o.   MRN: [id number]  PCP: Jerrell Cleatus Ned, MD  Chief Complaint  Patient presents with   Abdominal Pain    GI can not get patient in until 2 months from now. Abdominal pain and constipation     Subjective:     HPI  Discussed the use of AI scribe software for clinical note transcription with the patient, who gave verbal consent to proceed.  History of Present Illness Heather Fry is a 49 year old female with IBS-C who presents with a flare-up of symptoms.  She is experiencing fatigue and changes in bowel habits, with a recent missed bowel movement and a 'chunky, watery' stool. She has been consuming prunes and avocados to aid digestion, and typically finds that a [employer] burger helps, but it did not this time. She uses Bentyl  for IBS spasms and suspects it may have contributed to her current symptoms. She recalls having a large meal at Thanksgiving and then reducing her intake, which she believes may have affected her electrolytes and bowel function. She is currently using a stool softener, Colace, but finds it ineffective. Reports fatigue and body stress.  She has a history of electrolyte imbalances related to thyroid  dysfunction, which affects her ability to retain electrolytes when increasing water  intake. Recently, she experienced heart palpitations and tingling, prompting a hospital visit where low potassium and calcium  levels were noted. She treated these before the visit, and her blood work confirmed prior low levels. She reports feeling better now.  She mentions a past diagnosis of pancreatitis in the ER about two years ago and is currently dealing with typical allergies. She identifies certain food triggers, such as lettuce, which can exacerbate her symptoms.  She also reports a skin issue initially thought to be jock itch, which has spread and is being treated with nystatin cream. She  is concerned about it spreading to her hands, which burn and itch.      Objective:    BP (!) 140/78   Pulse 92   Wt 268 lb (121.6 kg)   LMP 10/09/2014   SpO2 99%   BMI 47.47 kg/m   Physical Exam  Gen: Well-appearing woman Abd: Soft, nontender, nondistended, no organomegaly, Dexcom CGM is in place in the left upper quadrant Ext: Warm, no edema     Assessment & Plan:   Problem List Items Addressed This Visit       Low   Hypomagnesemia (Chronic)   Patient is very vigilant about electrolyte levels.  History of hypomagnesemia that cause her significant symptoms, currently doing well.  She requested follow-up of her electrolytes based on her current symptoms which I think is fine.  Will collect a BMP and magnesium  level today.  Continue current supplements      Relevant Orders   Basic metabolic panel with GFR   Magnesium    Irritable bowel syndrome - Primary (Chronic)   Patient reports a history of IBS constipation type.  Current symptoms are pretty mild, but did lead to an emergency department evaluation earlier this week.  Abdominal exam is reassuring, no signs of bowel obstruction.  Had an x-ray that showed moderate stool burden.  We talked about Colace being very low value, she uses dicyclomine  for occasional intestinal spasm discomfort.  I recommended trying Linzess to treat the IBS and chronic constipation.  Patient is very sensitive to medications so we will start with  low-dose 72 mcg daily or every other day.  Follow-up with me in 3 months.  She has consultation with GI planned for later in January.      Relevant Medications   linaclotide (LINZESS) 72 MCG capsule    Meds ordered this encounter  Medications   linaclotide (LINZESS) 72 MCG capsule    Sig: Take 1 capsule (72 mcg total) by mouth daily before breakfast.    Dispense:  30 capsule    Refill:  2    No follow-ups on file.  Cleatus Debby Specking, MD [facility] at [facility]   "

## 2024-09-16 NOTE — Assessment & Plan Note (Signed)
 Patient reports a history of IBS constipation type.  Current symptoms are pretty mild, but did lead to an emergency department evaluation earlier this week.  Abdominal exam is reassuring, no signs of bowel obstruction.  Had an x-ray that showed moderate stool burden.  We talked about Colace being very low value, she uses dicyclomine  for occasional intestinal spasm discomfort.  I recommended trying Linzess to treat the IBS and chronic constipation.  Patient is very sensitive to medications so we will start with low-dose 72 mcg daily or every other day.  Follow-up with me in 3 months.  She has consultation with GI planned for later in January.

## 2024-09-16 NOTE — Patient Instructions (Signed)
" °  VISIT SUMMARY: Today, we discussed your recent flare-up of IBS symptoms, including fatigue, changes in bowel habits, and electrolyte imbalances. We also reviewed your history of hypothyroidism, type 2 diabetes, and a skin issue. We have made some adjustments to your treatment plan to help manage your symptoms.  YOUR PLAN: -IRRITABLE BOWEL SYNDROME WITH MIXED BOWEL HABITS: Irritable bowel syndrome (IBS) is a condition that affects your digestive system, causing symptoms like stomach cramps, bloating, and changes in bowel habits. You are currently experiencing a flare-up with constipation and moderate stool burden. Continue using natural remedies like ginger lemon tea and increasing your water  intake. Linzess  has been prescribed for constipation and is available at the pharmacy if needed. Please call on Monday if your symptoms persist over the weekend. We will hold off on starting Mounjaro  until your symptoms improve.  -ELECTROLYTE DISTURBANCES (HYPOKALEMIA AND HYPOCALCEMIA): Electrolyte disturbances occur when the levels of minerals like potassium and calcium  in your blood are too low. Your recent low levels were likely related to dietary changes and your IBS flare-up. We rechecked your electrolytes today. Please monitor for symptoms of imbalance such as cramps or dizziness.  -POSTOPERATIVE HYPOTHYROIDISM: Hypothyroidism is a condition where your thyroid  gland does not produce enough thyroid  hormone. This is being managed with Synthroid , and no changes are needed at this time.  -TYPE 2 DIABETES MELLITUS: Type 2 diabetes is a condition that affects the way your body processes blood sugar. Your management is ongoing, but we will delay starting Mounjaro  due to your current IBS flare-up.  INSTRUCTIONS: Please call on Monday if your IBS symptoms persist over the weekend. Continue monitoring for symptoms of electrolyte imbalance such as cramps or dizziness. Your electrolytes were rechecked today, and we will  follow up with the results. We will hold off on starting Mounjaro  for your diabetes until your IBS symptoms improve.   "

## 2024-09-16 NOTE — Assessment & Plan Note (Signed)
 Patient is very vigilant about electrolyte levels.  History of hypomagnesemia that cause her significant symptoms, currently doing well.  She requested follow-up of her electrolytes based on her current symptoms which I think is fine.  Will collect a BMP and magnesium  level today.  Continue current supplements

## 2024-09-21 ENCOUNTER — Emergency Department (HOSPITAL_COMMUNITY)
Admission: EM | Admit: 2024-09-21 | Discharge: 2024-09-21 | Disposition: A | Discharge status: Home / Self Care | Attending: Emergency Medicine | Admitting: Emergency Medicine

## 2024-09-21 ENCOUNTER — Other Ambulatory Visit: Payer: Self-pay

## 2024-09-21 ENCOUNTER — Encounter (HOSPITAL_COMMUNITY): Payer: Self-pay

## 2024-09-21 DIAGNOSIS — S0990XA Unspecified injury of head, initial encounter: Secondary | ICD-10-CM | POA: Insufficient documentation

## 2024-09-21 DIAGNOSIS — Z87891 Personal history of nicotine dependence: Secondary | ICD-10-CM | POA: Insufficient documentation

## 2024-09-21 DIAGNOSIS — Z7951 Long term (current) use of inhaled steroids: Secondary | ICD-10-CM | POA: Insufficient documentation

## 2024-09-21 DIAGNOSIS — J45909 Unspecified asthma, uncomplicated: Secondary | ICD-10-CM | POA: Insufficient documentation

## 2024-09-21 DIAGNOSIS — E119 Type 2 diabetes mellitus without complications: Secondary | ICD-10-CM | POA: Insufficient documentation

## 2024-09-21 DIAGNOSIS — Z7984 Long term (current) use of oral hypoglycemic drugs: Secondary | ICD-10-CM | POA: Diagnosis not present

## 2024-09-21 DIAGNOSIS — E039 Hypothyroidism, unspecified: Secondary | ICD-10-CM | POA: Diagnosis not present

## 2024-09-21 DIAGNOSIS — W228XXA Striking against or struck by other objects, initial encounter: Secondary | ICD-10-CM | POA: Diagnosis not present

## 2024-09-21 NOTE — Discharge Instructions (Signed)
 Based on the events which brought you to the ER today, it is possible that you may have a concussion. A concussion occurs when there is a blow to the head or body, with enough force to shake the brain and disrupt how the brain functions. You may experience symptoms such as headaches, sensitivity to light/noise, dizziness, cognitive slowing, difficulty concentrating / remembering, trouble sleeping and drowsiness. These symptoms may last anywhere from hours/days to potentially weeks/months. While these symptoms are very frustrating and perhaps debilitating, it is important that you remember that they will improve over time. Everyone has a different rate of recovery; it is difficult to predict when your symptoms will resolve. In order to allow for your brain to heal after the injury, we recommend that you see your primary physician or a physician knowledgeable in concussion management. We also advise you to let your body and brain rest: avoid physical activities (sports, gym, and exercise) and reduce cognitive demands (reading, texting, TV watching, computer use, video games, etc). School attendance, after-school activities and work may need to be modified to avoid increasing symptoms. We recommend against driving until until all symptoms have resolved. Come back to the ER right away if you are having repeated episodes of vomiting, severe/worsening headache/dizziness or any other symptom that alarms you. We recommended that someone stay with you for the next 24 hours to monitor for these worrisome symptoms.

## 2024-09-21 NOTE — ED Triage Notes (Signed)
 Pt hit her head on the freezer door and now has some head pain. Denies LOC and is not on anticoagulants. Pt has not taken any medication for the pain.

## 2024-09-21 NOTE — ED Notes (Signed)
 Pt/family received d/c paperwork at this time. After going over the paperwork any questions, comments, or concerns were answered to the best of this nurse's knowledge. The pt/family verbally acknowledged the teachings/instructions.

## 2024-09-21 NOTE — ED Provider Notes (Signed)
 " [place] EMERGENCY DEPARTMENT AT [facility] Provider Note  CSN: [id number] Arrival date & time: 09/21/24 2043  Chief Complaint(s) Head Injury  HPI FLORENTINA MARQUART is a 48 y.o. female with past medical history as below, significant for anemia, anxiety, depression, DM, diverticulosis, Graves' disease, sleep apnea who presents to the ED with complaint of head injury  This afternoon around 4 PM patient was getting ice out of her freezer when she abruptly lifted her head and struck the freezer door.  Felt slightly dazed after the episode.  Mild headache, symptoms have since improved.  No LOC, no thinners.  No nausea or vomiting, no vision or hearing changes.  No numbness or weakness to her extremities.  No syncope.  She initially had a hematoma to the crown of her head which has since resolved.  No bleeding.  No behavior changes.  Past Medical History Past Medical History:  Diagnosis Date   Allergic asthma    Allergy     Anemia    Anxiety    Arthritis    Constipation    Depression    Diabetes mellitus without complication (HCC)    Diverticulosis    Fatty liver    moderate to severe   Fatty liver    GERD (gastroesophageal reflux disease)    Graves disease    Headache    tension, ocular migraines   High cholesterol    Hypothyroidism    Internal hemorrhoids    Osteoarthritis    Prolonged Q-T interval on ECG    Sleep apnea    Stomach ulcer    Thyroid  disease    Uterine fibroid    Vitamin D  deficiency    Patient Active Problem List   Diagnosis Date Noted   Bilateral primary osteoarthritis of knee 03/07/2024   Moderate persistent asthma, uncomplicated 06/08/2022   Seasonal and perennial allergic rhinitis 06/08/2022   Multiple drug allergies 06/08/2022   Type 2 diabetes mellitus with hyperglycemia (HCC) 04/04/2022   Psychophysiologic insomnia 01/11/2022   Hyperlipidemia associated with type 2 diabetes mellitus (HCC) 10/15/2021   Essential hypertension 10/15/2021    Hypocalcemia 08/08/2021   Disc disease, degenerative, cervical 04/19/2021   Right carpal tunnel syndrome 04/19/2021   Irritable bowel syndrome 04/17/2021   Postoperative hypothyroidism 07/29/2020   Idiopathic peripheral neuropathy 04/18/2020   OSA (obstructive sleep apnea) 04/18/2020   RLS (restless legs syndrome) 04/18/2020   Obesity, Class III, BMI 40-49.9 (morbid obesity) (HCC) 01/20/2020   Vitamin B 12 deficiency 07/28/2019   Hypomagnesemia 03/29/2019   Perimenopausal symptoms 11/30/2018   Vitamin D  deficiency 10/06/2018   Gastroesophageal reflux disease 01/28/2017   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 Authorizing Provider  acarbose  (PRECOSE ) 100 MG tablet Take 1 tablet (100 mg total) by mouth 3 (three) times daily with meals. 03/03/24   Thapa, Sudan, MD  albuterol  (VENTOLIN  HFA) 108 (90 Base) MCG/ACT inhaler Inhale 2 puffs into the lungs every 6 (six) hours as needed. 08/18/24   Jerrell Cleatus Ned, MD  cetirizine  (ZYRTEC ) 10 MG tablet Take 10 mg by mouth daily.    [provider]  Continuous Glucose Sensor (DEXCOM G7 SENSOR) MISC as directed. 09/04/24   [provider]  EPINEPHrine  0.3 mg/0.3 mL IJ SOAJ injection Inject 0.3 mg into the muscle as needed for anaphylaxis. 07/02/22   Iva Marty Saltness, MD  EQUATE STOOL SOFTENER 100 MG  capsule Take 100 mg by mouth 2 (two) times daily as needed for moderate constipation. 08/21/21   [provider]  escitalopram  (LEXAPRO ) 20 MG tablet Take 1 tablet (20 mg total) by mouth at bedtime. 01/26/19   Kassie Mallick, MD  fluticasone  (FLONASE ) 50 MCG/ACT nasal spray Place 2 sprays into both nostrils at bedtime as needed for allergies.    [provider]  glimepiride  (AMARYL ) 1 MG tablet Take 1 tablet (1 mg total) by mouth in the morning, at noon, and at bedtime. 06/17/24   Jerrell Cleatus Ned, MD  glucose blood (ACCU-CHEK AVIVA PLUS) test strip 1 each by Other route daily. And lancets 1/day 05/30/21   Kassie Mallick, MD  ipratropium (ATROVENT ) 0.02 % nebulizer solution INHALE 1 VIAL IN NEBULIZER EVERY 6 HOURS AS NEEDED FOR WHEEZING AND FOR SHORTNESS OF BREATH 09/13/24   Jerrell Cleatus Ned, MD  levothyroxine  (SYNTHROID ) 137 MCG tablet Take 137 mcg by mouth daily before breakfast.    [provider]  linaclotide  (LINZESS ) 72 MCG capsule Take 1 capsule (72 mcg total) by mouth daily before breakfast. 09/16/24   Jerrell Cleatus Ned, MD  Magnesium  250 MG TABS Take 250 mg by mouth at bedtime.     [provider]  NON FORMULARY at bedtime. CPAP at night    [provider]  ondansetron  (ZOFRAN -ODT) 4 MG disintegrating tablet 4mg  ODT q4 hours prn nausea/vomit 03/16/24   Zammit, Joseph, MD  pantoprazole  (PROTONIX ) 40 MG tablet Take 1 tablet (40 mg total) by mouth daily. 09/13/24   Idol, Julie, PA-C  rosuvastatin  (CRESTOR ) 10 MG tablet Take one tablet by mouth three days weekly, on Mon, Wed, and Friday 07/30/24   Jerrell Cleatus Ned, MD  Simethicone  250 MG CAPS Take 500 mg by mouth at bedtime.    [provider]  sucralfate  (CARAFATE ) 1 GM/10ML suspension TAKE 10 ML BY MOUTH  4 TIMES DAILY AS NEEDED 06/09/22   Rudy Josette RAMAN, PA-C  tirzepatide  (MOUNJARO ) 2.5 MG/0.5ML Pen Inject 2.5 mg into the skin once a week. 09/07/24   Jerrell Cleatus Ned, MD                                                                                                                                    Past Surgical History Past Surgical History:  Procedure Laterality Date   ABDOMINAL HYSTERECTOMY     Per patient, uterine fibroids.   ABDOMINAL HYSTERECTOMY     BIOPSY  05/06/2023   Procedure: BIOPSY;  Surgeon: Shaaron Lamar HERO, MD;  Location: [facility];  Service: Endoscopy;;   BREAST BIOPSY Right 10/23/2023   MM RT BREAST BX W LOC DEV 1ST LESION IMAGE BX SPEC STEREO  GUIDE 10/23/2023 [facility]   CARPAL TUNNEL RELEASE Right 06/11/2021   Procedure: Right Carpel tunnel release;  Surgeon: Ned Dorn MATSU, MD;  Location: [facility] OR;  Service: Neurosurgery;  Laterality: Right;  RM 18  CHOLECYSTECTOMY N/A 11/06/2014   Procedure: LAPAROSCOPIC CHOLECYSTECTOMY;  Surgeon: Oneil DELENA Budge, MD;  Location: [facility] ORS;  Service: General;  Laterality: N/A;   COLONOSCOPY N/A 03/04/2017   Dr. Shaaron: Hemorrhoids, diverticulosis, benign polyp without adenomatous changes.  Next colonoscopy 10 years   COLONOSCOPY WITH PROPOFOL  N/A 08/28/2022   Procedure: COLONOSCOPY WITH PROPOFOL ;  Surgeon: Shaaron Lamar HERO, MD;  Location: [facility];  Service: Endoscopy;  Laterality: N/A;  9:00 am   DILATION AND CURETTAGE OF UTERUS     ESOPHAGOGASTRODUODENOSCOPY N/A 10/02/2014   Dr. Shaaron: fundic gland polyps, hiatal hernia   ESOPHAGOGASTRODUODENOSCOPY  07/2020   [facility] ; normal esophagus biopsied, benign-appearing gastric polyps biopsied, gastric biopsies obtained to evaluate for H. pylori, normal duodenum.  Pathology with mild chronic gastritis, negative for H. pylori, fundic gland polyps, esophageal biopsy benign.   ESOPHAGOGASTRODUODENOSCOPY (EGD) WITH PROPOFOL  N/A 05/06/2023   Procedure: ESOPHAGOGASTRODUODENOSCOPY (EGD) WITH PROPOFOL ;  Surgeon: Shaaron Lamar HERO, MD;  Location: [facility];  Service: Endoscopy;  Laterality: N/A;  8:00 AM, ASA 3   MOUTH SURGERY     extraction of teeth   POLYPECTOMY  03/04/2017   Procedure: POLYPECTOMY;  Surgeon: Shaaron Lamar HERO, MD;  Location: [facility];  Service: Endoscopy;;  colon   RIGHT OOPHORECTOMY  07/2021   THYROIDECTOMY N/A 09/30/2018   Procedure: TOTAL THYROIDECTOMY;  Surgeon: Eletha Boas, MD;  Location: [facility] ORS;  Service: General;  Laterality: N/A;   TUBAL LIGATION     Family History Family History  Problem Relation Age of Onset   Allergic rhinitis Mother    Uterine cancer Mother        ?cervical cancer   Depression  Mother    Anxiety disorder Mother    Arthritis Mother    Obesity Mother    Allergic rhinitis Father    Colon cancer Father 96   Hypertension Father    Diabetes Father    Colon polyps Father        age less than 38   High Cholesterol Father    Depression Father    Anxiety disorder Father    Liver disease Father    Alcoholism Father    Obesity Father    Arthritis Father    Cancer Father    Hearing loss Father    Uterine cancer Sister        suicide   Colon cancer Paternal Aunt 50       also had skin cancer, ovarian?   Cancer Paternal Aunt    Depression Other    Miscarriages / Stillbirths Paternal Grandmother    Asthma Paternal Uncle    Thyroid  disease Neg Hx    Pancreatic cancer Neg Hx     Social History Social History[1] Allergies Bee venom, Ketorolac  tromethamine , Hydrochlorothiazide, Levofloxacin, Levomenol, Other, Tramadol , Triamcinolone acetonide, Depo-medrol  [methylprednisolone ], Budesonide -formoterol  fumarate, Lisinopril, Losartan  potassium, Nystatin, Penicillins, and Wound dressing adhesive  Review of Systems A thorough review of systems was obtained and all systems are negative except as noted in the HPI and PMH.   Physical Exam Vital Signs  I have reviewed the triage vital signs BP (!) 181/104 (BP Location: Left Arm)   Pulse 98   Temp 98.9 F (37.2 C) (Oral)   Resp 20   Ht 5' 3 (1.6 m)   Wt 121.6 kg   LMP 10/09/2014   SpO2 97%   BMI 47.47 kg/m  Physical Exam Vitals and nursing note reviewed.  Constitutional:      General:  She is not in acute distress.    Appearance: Normal appearance. She is well-developed. She is obese. She is not ill-appearing.  HENT:     Head: Normocephalic and atraumatic.     Right Ear: External ear normal.     Left Ear: External ear normal.     Nose: Nose normal.     Mouth/Throat:     Mouth: Mucous membranes are moist.  Eyes:     General: No visual field deficit or scleral icterus.       Right eye: No discharge.         Left eye: No discharge.     Extraocular Movements: Extraocular movements intact.     Pupils: Pupils are equal, round, and reactive to light.  Neck:   Cardiovascular:     Rate and Rhythm: Normal rate.  Pulmonary:     Effort: Pulmonary effort is normal. No respiratory distress.     Breath sounds: No stridor.  Abdominal:     General: Abdomen is flat. There is no distension.     Tenderness: There is no guarding.  Musculoskeletal:        General: No deformity.     Cervical back: No rigidity. Muscular tenderness present. No spinous process tenderness.  Skin:    General: Skin is warm and dry.     Coloration: Skin is not cyanotic, jaundiced or pale.  Neurological:     Mental Status: She is alert and oriented to person, place, and time.     GCS: GCS eye subscore is 4. GCS verbal subscore is 5. GCS motor subscore is 6.     Cranial Nerves: Cranial nerves 2-12 are intact. No dysarthria or facial asymmetry.     Sensory: Sensation is intact.     Motor: Motor function is intact. No tremor or pronator drift.     Coordination: Coordination is intact.     Comments: Gait testing deferred secondary to patient safety. (Wheelchair depend)   Psychiatric:        Speech: Speech normal.        Behavior: Behavior normal. Behavior is cooperative.     ED Results and Treatments Labs (all labs ordered are listed, but only abnormal results are displayed) Labs Reviewed - No data to display                                                                                                                        Radiology No results found.  Pertinent labs & imaging results that were available during my care of the patient were reviewed by me and considered in my medical decision making (see MDM for details).  Medications Ordered in ED Medications - No data to display  Procedures Procedures  (including critical care time)  Medical Decision Making / ED Course    Medical Decision Making:    YAILENE BADIA is a 48 y.o. female with past medical history as below, significant for anemia, anxiety, depression, DM, diverticulosis, Graves' disease, sleep apnea who presents to the ED with complaint of head injury. The complaint involves an extensive differential diagnosis and also carries with it a high risk of complications and morbidity.  Serious etiology was considered. Ddx includes but is not limited to: Differential diagnoses for head trauma includes subdural hematoma, epidural hematoma, acute concussion, traumatic subarachnoid hemorrhage, cerebral contusions, etc.   Complete initial physical exam performed, notably the patient was in no acute distress, sitting comfortably in wheelchair.    Reviewed and confirmed nursing documentation for past medical history, family history, social history.  Vital signs reviewed.    Head injury> - Head injury.  No LOC, no thinners.  No focal neurologic deficits.  No external evidence of trauma.  No hematoma.  Nexus head CT negative, canadian CT head also negative (mild disorientation after initial injury) - She has some MSK, muscular tenderness to her right shoulder.  No midline TTP.  Strength equal upper extremities.  NVI upper extremities bilateral. - Possible mild concussion. - I do not feel imaging is warranted at this time.  Imaging was offered but pt prefers to defer at this time. - Provide concussion precautions.  Follow-up PCP   Clinical Course as of 09/21/24 2248  Wed Sep 21, 2024  2213 NEXUS head CT negative  [SG]    Clinical Course User Index [SG] Elnor Jayson LABOR, DO     10:48 PM:  I have discussed the diagnosis/risks/treatment options with the patient and family.  Evaluation and diagnostic testing in the emergency department does not suggest an emergent condition requiring admission or immediate  intervention beyond what has been performed at this time.  They will follow up with pcp. We also discussed returning to the ED immediately if new or worsening sx occur. We discussed the sx which are most concerning (e.g., sudden worsening pain, fever, inability to tolerate by mouth) that necessitate immediate return.    The patient appears reasonably screened and/or stabilized for discharge and I doubt any other medical condition or other [facility] requiring further screening, evaluation, or treatment in the ED at this time prior to discharge.                 Additional history obtained: -Additional history obtained from family -External records from outside source obtained and reviewed including: Chart review including previous notes, labs, imaging, consultation notes including  Home meds   Lab Tests: na  EKG   EKG Interpretation Date/Time:    Ventricular Rate:    PR Interval:    QRS Duration:    QT Interval:    QTC Calculation:   R Axis:      Text Interpretation:           Imaging Studies ordered: na   Medicines ordered and prescription drug management: No orders of the defined types were placed in this encounter.   -I have reviewed the patients home medicines and have made adjustments as needed   Consultations Obtained: na   Cardiac Monitoring: Continuous pulse oximetry interpreted by myself, 98% on RA.    Social Determinants of Health:  Diagnosis or treatment significantly limited by social determinants of health: former smoker   Reevaluation: After the interventions noted above, I reevaluated the patient and  found that they have improved  Co morbidities that complicate the patient evaluation  Past Medical History:  Diagnosis Date   Allergic asthma    Allergy     Anemia    Anxiety    Arthritis    Constipation    Depression    Diabetes mellitus without complication (HCC)    Diverticulosis    Fatty liver    moderate to severe   Fatty  liver    GERD (gastroesophageal reflux disease)    Graves disease    Headache    tension, ocular migraines   High cholesterol    Hypothyroidism    Internal hemorrhoids    Osteoarthritis    Prolonged Q-T interval on ECG    Sleep apnea    Stomach ulcer    Thyroid  disease    Uterine fibroid    Vitamin D  deficiency       Dispostion: Disposition decision including need for hospitalization was considered, and patient discharged from emergency department.    Final Clinical Impression(s) / ED Diagnoses Final diagnoses:  Minor head injury, initial encounter         [1]  Social History Tobacco Use   Smoking status: Former    Current packs/day: 0.00    Average packs/day: 1 pack/day for 5.0 years (5.0 ttl pk-yrs)    Types: Cigarettes    Start date: 11/03/1992    Quit date: 11/03/1997    Years since quitting: 26.9    Passive exposure: Current   Smokeless tobacco: Never   Tobacco comments:    1 pack 1 week    Smoking Cessation Offered  Vaping Use   Vaping status: Never Used  Substance Use Topics   Alcohol use: No    Alcohol/week: 0.0 standard drinks of alcohol   Drug use: No     Elnor Jayson LABOR, DO 09/21/24 2248  "

## 2024-09-26 ENCOUNTER — Ambulatory Visit

## 2024-09-26 DIAGNOSIS — J069 Acute upper respiratory infection, unspecified: Secondary | ICD-10-CM | POA: Diagnosis not present

## 2024-09-26 LAB — POCT INFLUENZA A/B
Influenza A, POC: NEGATIVE
Influenza B, POC: NEGATIVE

## 2024-09-26 NOTE — Progress Notes (Signed)
 Heather Fry is a 48 y.o. female presents in office today for a nurse visit for Flu testing per Dr.Vincent.  Patient was in clinic with Daughter.   Keriana Sarsfield R Twilla Khouri

## 2024-10-19 ENCOUNTER — Encounter (HOSPITAL_COMMUNITY): Payer: Self-pay | Admitting: *Deleted

## 2024-10-19 ENCOUNTER — Emergency Department (HOSPITAL_COMMUNITY)

## 2024-10-19 ENCOUNTER — Emergency Department (HOSPITAL_COMMUNITY)
Admission: EM | Admit: 2024-10-19 | Discharge: 2024-10-19 | Disposition: A | Discharge status: Home / Self Care | Attending: Emergency Medicine | Admitting: Emergency Medicine

## 2024-10-19 ENCOUNTER — Other Ambulatory Visit: Payer: Self-pay

## 2024-10-19 DIAGNOSIS — Z7951 Long term (current) use of inhaled steroids: Secondary | ICD-10-CM | POA: Diagnosis not present

## 2024-10-19 DIAGNOSIS — R000 Tachycardia, unspecified: Secondary | ICD-10-CM | POA: Insufficient documentation

## 2024-10-19 DIAGNOSIS — E1165 Type 2 diabetes mellitus with hyperglycemia: Secondary | ICD-10-CM | POA: Diagnosis not present

## 2024-10-19 DIAGNOSIS — Z79899 Other long term (current) drug therapy: Secondary | ICD-10-CM | POA: Diagnosis not present

## 2024-10-19 DIAGNOSIS — R0602 Shortness of breath: Secondary | ICD-10-CM | POA: Diagnosis present

## 2024-10-19 DIAGNOSIS — J45901 Unspecified asthma with (acute) exacerbation: Secondary | ICD-10-CM | POA: Diagnosis not present

## 2024-10-19 DIAGNOSIS — Z7984 Long term (current) use of oral hypoglycemic drugs: Secondary | ICD-10-CM | POA: Insufficient documentation

## 2024-10-19 LAB — D-DIMER, QUANTITATIVE: D-Dimer, Quant: 0.56 ug{FEU}/mL — ABNORMAL HIGH (ref 0.00–0.50)

## 2024-10-19 LAB — COMPREHENSIVE METABOLIC PANEL WITH GFR
ALT: 22 U/L (ref 0–44)
AST: 19 U/L (ref 15–41)
Albumin: 4.6 g/dL (ref 3.5–5.0)
Alkaline Phosphatase: 72 U/L (ref 38–126)
Anion gap: 16 — ABNORMAL HIGH (ref 5–15)
BUN: 9 mg/dL (ref 6–20)
CO2: 23 mmol/L (ref 22–32)
Calcium: 9.8 mg/dL (ref 8.9–10.3)
Chloride: 101 mmol/L (ref 98–111)
Creatinine, Ser: 0.73 mg/dL (ref 0.44–1.00)
GFR, Estimated: >60 mL/min
Glucose, Bld: 198 mg/dL — ABNORMAL HIGH (ref 70–99)
Potassium: 3.7 mmol/L (ref 3.5–5.1)
Sodium: 141 mmol/L (ref 135–145)
Total Bilirubin: 0.3 mg/dL (ref 0.0–1.2)
Total Protein: 8 g/dL (ref 6.5–8.1)

## 2024-10-19 LAB — CBC WITH DIFFERENTIAL/PLATELET
Abs Immature Granulocytes: 0.03 K/uL (ref 0.00–0.07)
Basophils Absolute: 0 K/uL (ref 0.0–0.1)
Basophils Relative: 0 %
Eosinophils Absolute: 0.1 K/uL (ref 0.0–0.5)
Eosinophils Relative: 1 %
HCT: 41.8 % (ref 36.0–46.0)
Hemoglobin: 13.7 g/dL (ref 12.0–15.0)
Immature Granulocytes: 0 %
Lymphocytes Relative: 15 %
Lymphs Abs: 1.6 K/uL (ref 0.7–4.0)
MCH: 28.2 pg (ref 26.0–34.0)
MCHC: 32.8 g/dL (ref 30.0–36.0)
MCV: 86 fL (ref 80.0–100.0)
Monocytes Absolute: 0.4 K/uL (ref 0.1–1.0)
Monocytes Relative: 4 %
Neutro Abs: 8.9 K/uL — ABNORMAL HIGH (ref 1.7–7.7)
Neutrophils Relative %: 80 %
Platelets: 254 K/uL (ref 150–400)
RBC: 4.86 MIL/uL (ref 3.87–5.11)
RDW: 14.3 % (ref 11.5–15.5)
WBC: 11.1 K/uL — ABNORMAL HIGH (ref 4.0–10.5)
nRBC: 0 % (ref 0.0–0.2)

## 2024-10-19 LAB — RESP PANEL BY RT-PCR (RSV, FLU A&B, COVID)  RVPGX2
Influenza A by PCR: NEGATIVE
Influenza B by PCR: NEGATIVE
Resp Syncytial Virus by PCR: NEGATIVE
SARS Coronavirus 2 by RT PCR: NEGATIVE

## 2024-10-19 LAB — TROPONIN T, HIGH SENSITIVITY
Troponin T High Sensitivity: <6 ng/L (ref 0–19)
Troponin T High Sensitivity: <6 ng/L (ref 0–19)

## 2024-10-19 MED ORDER — IOHEXOL 350 MG/ML SOLN
75.0000 mL | Freq: Once | INTRAVENOUS | Status: AC | PRN
  Administered 2024-10-19: 75 mL via INTRAVENOUS

## 2024-10-19 MED ORDER — IPRATROPIUM-ALBUTEROL 0.5-2.5 (3) MG/3ML IN SOLN
3.0000 mL | Freq: Once | RESPIRATORY_TRACT | Status: AC
  Administered 2024-10-19: 3 mL via RESPIRATORY_TRACT
  Filled 2024-10-19: qty 3

## 2024-10-19 NOTE — Discharge Instructions (Signed)
 Continue medications as prescribed including breathing treatments and Zyrtec .  May take a daily Pepcid  20 mg as well for the next few days as an antihistamine.  Please follow-up with primary care doctor in 1 week for recheck.  Return to ED if any symptoms worsen including uncontrollable fevers, chest pain, difficulty breathing.

## 2024-10-19 NOTE — ED Triage Notes (Signed)
 Pt with SOB and heart rate going up and down since this morning per pt. Pt states she had an asthma attack earlier today. Not able to get sleep last night. Run out of med Zrytec few days ago.

## 2024-10-19 NOTE — ED Provider Notes (Signed)
 " [place] EMERGENCY DEPARTMENT AT [facility] Provider Note   CSN: [id number] Arrival date & time: 10/19/24  1343     Patient presents with: Shortness of Breath   Heather Fry is a 49 y.o. female.  Patient is a 49 year old female with a history of asthma, OSA, hyperlipidemia, type 2 diabetes who presents to the ED for shortness of breath and elevated heart rate that began this morning.  Patient notes that her asthma has been flaring up the past couple days after she ran out of Zyrtec .  She notes she did take a Zyrtec  yesterday and was doing better but symptoms worsened again today.  Notes her chest does feel tight.  She states she felt her heart racing prompting her to come to the ED.  Notes she does have a portable nebulizer that she has been using more often.  Notes she was sick with a sinus infection a couple weeks ago but has been feeling better other than today.  Denies headache, dizziness, syncope, abdominal pain, nausea/vomiting/diarrhea.    Shortness of Breath Associated symptoms: no abdominal pain, no chest pain, no cough, no fever, no headaches and no vomiting        Prior to Admission medications  Medication Sig Start Date End Date Taking? Authorizing Provider  acarbose  (PRECOSE ) 100 MG tablet Take 1 tablet (100 mg total) by mouth 3 (three) times daily with meals. 03/03/24   Thapa, Sudan, MD  albuterol  (VENTOLIN  HFA) 108 (90 Base) MCG/ACT inhaler Inhale 2 puffs into the lungs every 6 (six) hours as needed. 08/18/24   Jerrell Cleatus Ned, MD  cetirizine  (ZYRTEC ) 10 MG tablet Take 10 mg by mouth daily.    [provider]  Continuous Glucose Sensor (DEXCOM G7 SENSOR) MISC as directed. 09/04/24   [provider]  EPINEPHrine  0.3 mg/0.3 mL IJ SOAJ injection Inject 0.3 mg into the muscle as needed for anaphylaxis. 07/02/22   Iva Marty Saltness, MD  EQUATE STOOL SOFTENER 100 MG capsule Take 100 mg by mouth 2 (two) times daily as needed for moderate  constipation. 08/21/21   [provider]  escitalopram  (LEXAPRO ) 20 MG tablet Take 1 tablet (20 mg total) by mouth at bedtime. 01/26/19   Kassie Mallick, MD  fluticasone  (FLONASE ) 50 MCG/ACT nasal spray Place 2 sprays into both nostrils at bedtime as needed for allergies.    [provider]  glimepiride  (AMARYL ) 1 MG tablet Take 1 tablet (1 mg total) by mouth in the morning, at noon, and at bedtime. 06/17/24   Jerrell Cleatus Ned, MD  glucose blood (ACCU-CHEK AVIVA PLUS) test strip 1 each by Other route daily. And lancets 1/day 05/30/21   Kassie Mallick, MD  ipratropium (ATROVENT ) 0.02 % nebulizer solution INHALE 1 VIAL IN NEBULIZER EVERY 6 HOURS AS NEEDED FOR WHEEZING AND FOR SHORTNESS OF BREATH 09/13/24   Jerrell Cleatus Ned, MD  levothyroxine  (SYNTHROID ) 137 MCG tablet Take 137 mcg by mouth daily before breakfast.    [provider]  linaclotide  (LINZESS ) 72 MCG capsule Take 1 capsule (72 mcg total) by mouth daily before breakfast. 09/16/24   Jerrell Cleatus Ned, MD  Magnesium  250 MG TABS Take 250 mg by mouth at bedtime.     [provider]  NON FORMULARY at bedtime. CPAP at night    [provider]  ondansetron  (ZOFRAN -ODT) 4 MG disintegrating tablet 4mg  ODT q4 hours prn nausea/vomit 03/16/24   Zammit, Joseph, MD  pantoprazole  (PROTONIX ) 40 MG tablet Take 1 tablet (40  mg total) by mouth daily. 09/13/24   Idol, Julie, PA-C  rosuvastatin  (CRESTOR ) 10 MG tablet Take one tablet by mouth three days weekly, on Mon, Wed, and Friday 07/30/24   Jerrell Cleatus Ned, MD  Simethicone  250 MG CAPS Take 500 mg by mouth at bedtime.    [provider]  sucralfate  (CARAFATE ) 1 GM/10ML suspension TAKE 10 ML BY MOUTH  4 TIMES DAILY AS NEEDED 06/09/22   Rudy Josette RAMAN, PA-C  tirzepatide  (MOUNJARO ) 2.5 MG/0.5ML Pen Inject 2.5 mg into the skin once a week. 09/07/24   Jerrell Cleatus Ned, MD    Allergies: Bee venom, Ketorolac  tromethamine ,  Hydrochlorothiazide, Levofloxacin, Levomenol, Other, Tramadol , Triamcinolone acetonide, Depo-medrol  [methylprednisolone ], Budesonide -formoterol  fumarate, Lisinopril, Losartan  potassium, Nystatin, Penicillins, and Wound dressing adhesive    Review of Systems  Constitutional:  Negative for chills and fever.  HENT:  Negative for congestion.   Respiratory:  Positive for chest tightness and shortness of breath. Negative for cough.   Cardiovascular:  Negative for chest pain.  Gastrointestinal:  Negative for abdominal pain, nausea and vomiting.  Neurological:  Negative for headaches.    Updated Vital Signs BP (!) 142/83   Pulse 97   Temp 98.2 F (36.8 C)   Resp 13   Ht 5' 3 (1.6 m)   Wt 121.6 kg   LMP 10/09/2014   SpO2 97%   BMI 47.47 kg/m   Physical Exam Constitutional:      Appearance: She is well-developed. She is obese.  HENT:     Head: Normocephalic and atraumatic.  Cardiovascular:     Rate and Rhythm: Tachycardia present.  Pulmonary:     Effort: Pulmonary effort is normal.     Breath sounds: Normal breath sounds.  Skin:    General: Skin is warm and dry.  Neurological:     Mental Status: She is alert and oriented to person, place, and time.  Psychiatric:        Mood and Affect: Mood normal.        Behavior: Behavior normal.     (all labs ordered are listed, but only abnormal results are displayed) Labs Reviewed  COMPREHENSIVE METABOLIC PANEL WITH GFR - Abnormal; Notable for the following components:      Result Value   Glucose, Bld 198 (*)    Anion gap 16 (*)    All other components within normal limits  CBC WITH DIFFERENTIAL/PLATELET - Abnormal; Notable for the following components:   WBC 11.1 (*)    Neutro Abs 8.9 (*)    All other components within normal limits  D-DIMER, QUANTITATIVE - Abnormal; Notable for the following components:   D-Dimer, Quant 0.56 (*)    All other components within normal limits  RESP PANEL BY RT-PCR (RSV, FLU A&B, COVID)  [id number]   TROPONIN T, HIGH SENSITIVITY  TROPONIN T, HIGH SENSITIVITY    EKG: None  Radiology: CT Angio Chest PE W and/or Wo Contrast Result Date: 10/19/2024 CLINICAL DATA:  Recent asthma attack with shortness of breath and tachycardia. EXAM: CT ANGIOGRAPHY CHEST WITH CONTRAST TECHNIQUE: Multidetector CT imaging of the chest was performed using the standard protocol during bolus administration of intravenous contrast. Multiplanar CT image reconstructions and MIPs were obtained to evaluate the vascular anatomy. RADIATION DOSE REDUCTION: This exam was performed according to the departmental dose-optimization program which includes automated exposure control, adjustment of the mA and/or kV according to patient size and/or use of iterative reconstruction technique. CONTRAST:  75mL OMNIPAQUE  IOHEXOL  350 MG/ML SOLN COMPARISON:  June 22, 2021 FINDINGS: Cardiovascular: The thoracic aorta is normal in appearance. Satisfactory opacification of the pulmonary arteries to the segmental level. No evidence of pulmonary embolism. Normal heart size. No pericardial effusion. Mediastinum/Nodes: No enlarged mediastinal, hilar, or axillary lymph nodes. Thyroid  gland, trachea, and esophagus demonstrate no significant findings. Lungs/Pleura: Mild areas of hazy ground-glass appearing lung parenchyma are seen within the left upper lobe and bilateral lower lobes. Moderate severity left lower lobe linear scarring and/or atelectasis is also noted. No pleural effusion or pneumothorax is identified. Upper Abdomen: There is diffuse fatty infiltration of the liver parenchyma. Musculoskeletal: No chest wall abnormality. No acute or significant osseous findings. Review of the MIP images confirms the above findings. IMPRESSION: 1. No evidence of pulmonary embolism. 2. Mild areas of hazy ground-glass appearing lung parenchyma within the left upper lobe and bilateral lower lobes, which may represent sequelae associated with an inflammatory process  or mild pulmonary edema. 3. Moderate severity left lower lobe linear scarring and/or atelectasis. 4. Hepatic steatosis. Electronically Signed   By: Suzen Dials M.D.   On: 10/19/2024 16:23   DG Chest Portable 1 View Result Date: 10/19/2024 CLINICAL DATA:  Shortness of breath EXAM: PORTABLE CHEST 1 VIEW COMPARISON:  Chest radiograph dated 03/27/2024. FINDINGS: The heart size and mediastinal contours are within normal limits. Both lungs are clear. The visualized skeletal structures are unremarkable. IMPRESSION: No active disease. Electronically Signed   By: Vanetta Chou M.D.   On: 10/19/2024 14:30      Medications Ordered in the ED  ipratropium-albuterol  (DUONEB) 0.5-2.5 (3) MG/3ML nebulizer solution 3 mL (3 mLs Nebulization Given 10/19/24 1440)  iohexol  (OMNIPAQUE ) 350 MG/ML injection 75 mL (75 mLs Intravenous Contrast Given 10/19/24 1604)                                Medical Decision Making Patient is a 49 year old female with history of asthma, OSA, hyperlipidemia, and type 2 diabetes who presents to the ED for increasing shortness of breath and elevated heart rate that worsened this morning.  Please see detailed HPI above.  On exam patient is alert and in no acute distress.  She is mildly tachycardic at a rate of around 110 but otherwise in no acute distress.  Lungs clear to auscultation.  Differential includes acute viral illness, asthma exacerbation, bronchitis, pneumonia, anxiety, PE, ACS.  Hyperglycemia of 198 noted and D-dimer mildly elevated at 0.56 but otherwise labs reassuring.  Negative viral swab.  CT angio of the chest was obtained and negative for acute PE.  There is evidence of inflammatory process or mild pulmonary edema as well as some atelectasis but no acute abnormalities.  Suspect these findings are secondary to underlying asthma.  Patient was given DuoNeb treatment and states she is feeling much better.  Suspect patient did have asthma exacerbation this morning versus  anxiety but symptoms have improved.  Vital signs are stable with normal heart rate.  She is stable for discharge home.  Advised to continue Zyrtec  and breathing treatments as prescribed.  She notes she does have Medrol  at home and will take a few doses.  Symptomatic care discussed.  Advised PCP follow-up in 1 week for recheck.  Return precautions provided.  Amount and/or Complexity of Data Reviewed Labs: ordered. Radiology: ordered.  Risk Prescription drug management.      Final diagnoses:  Exacerbation of asthma, unspecified asthma severity, unspecified whether persistent  SOB (shortness of breath)  ED Discharge Orders     None          Heather Fry 10/19/24 1655    Yolande Lamar BROCKS, MD 10/19/24 2020  "

## 2024-10-20 ENCOUNTER — Encounter: Payer: Self-pay | Admitting: Student in an Organized Health Care Education/Training Program

## 2024-10-20 ENCOUNTER — Ambulatory Visit: Admitting: Student in an Organized Health Care Education/Training Program

## 2024-10-20 VITALS — BP 160/94 | HR 85 | Temp 98.2°F | Ht 63.0 in | Wt 266.0 lb

## 2024-10-20 DIAGNOSIS — J4540 Moderate persistent asthma, uncomplicated: Secondary | ICD-10-CM

## 2024-10-20 DIAGNOSIS — E1165 Type 2 diabetes mellitus with hyperglycemia: Secondary | ICD-10-CM | POA: Diagnosis not present

## 2024-10-20 DIAGNOSIS — Z91030 Bee allergy status: Secondary | ICD-10-CM | POA: Diagnosis not present

## 2024-10-20 DIAGNOSIS — J22 Unspecified acute lower respiratory infection: Secondary | ICD-10-CM | POA: Diagnosis not present

## 2024-10-20 DIAGNOSIS — Z794 Long term (current) use of insulin: Secondary | ICD-10-CM | POA: Diagnosis not present

## 2024-10-20 MED ORDER — EPINEPHRINE 0.3 MG/0.3ML IJ SOAJ: 0.3000 mg | INTRAMUSCULAR | 1 refills | Status: AC | PRN

## 2024-10-20 NOTE — Assessment & Plan Note (Signed)
 Symptoms are consistent with a viral infection, likely exacerbating asthma. There is no fever, and a CT scan ruled out bacterial pneumonia. Continue Zyrtec  and Flonase . Rest and hydration are encouraged.

## 2024-10-20 NOTE — Assessment & Plan Note (Signed)
 The recent exacerbation is linked to lower respiratory tract infection. A CT scan showed mild inflammation without pneumonia. Methylprednisolone  caused tachycardia, so no further steroids are planned. Use a nebulizer every 4 hours as needed for dyspnea or worsening cough. Movement is encouraged to prevent weakness and potential pneumonia, and prolonged bed rest is advised against.

## 2024-10-20 NOTE — Assessment & Plan Note (Signed)
 A1c is at 7% on two oral medications. Mounjaro  is not yet approved by insurance. Weight impacts respiratory function. Contacted the pharmacy team for Mounjaro  prior authorization.

## 2024-10-20 NOTE — Assessment & Plan Note (Signed)
 Chronic and stable.  At risk for anaphylaxis.  Will refill EpiPens.

## 2024-10-20 NOTE — Progress Notes (Signed)
 "  Established Patient Office Visit  Patient ID: Heather Fry, female    DOB: March 03, 1976  Age: 49 y.o. MRN: [id number] PCP: Jerrell Cleatus Ned, MD  Chief Complaint  Patient presents with   Follow-up    Patient states no concerns to discuss. Asking for thyroid  to be rechecked.     Subjective:     HPI  Discussed the use of AI scribe software for clinical note transcription with the patient, who gave verbal consent to proceed.  History of Present Illness Heather Fry is a 49 year old female with asthma who presents with a recent severe asthma exacerbation.  She experienced a severe asthma attack yesterday afternoon, which required a visit to the emergency department. Despite using a breathing treatment earlier that day, she was uncertain about taking another within the four-hour window. At the hospital, she received another breathing treatment, which helped stabilize her condition. She also used her rescue inhaler, but her pulse remained elevated, and she felt short of breath with chest tightness and nasal congestion.  She reports feeling weak and had difficulty sleeping last night. Her heart rate normalized after the breathing treatment, but she experienced insomnia and palpitations after taking methylprednisolone . She attributes the exacerbation to not fully recovering from a recent upper respiratory virus, running out of Zyrtec  for two days, and poor air quality.  In the emergency department, she underwent a CT scan, chest X-ray, and D-dimer test. The D-dimer was slightly elevated, prompting the CT scan. She recalls being told that the CT scan showed hazy ground-glass opacities in the left upper and bilateral lower lung fields. She also reports a sore throat and postnasal drip this morning.  She has a history of thyroid  issues, which she feels have contributed to her increased sensitivity to environmental factors. She also mentions difficulty obtaining Mounjaro  due to insurance  issues, which was prescribed after her last A1c was 7% on two oral medications.  She has a history of allergies, including bee and wasp venom, and requires EpiPens, which she keeps in her bag and at home. Her current medications include Zyrtec , Flonase , and a rescue inhaler. She uses her nebulizer every four to nine hours as needed for shortness of breath.     Objective:     BP (!) 160/94   Pulse 85   Temp 98.2 F (36.8 C) (Oral)   Ht 5' 3 (1.6 m)   Wt 266 lb (120.7 kg)   LMP 10/09/2014   SpO2 98%   BMI 47.12 kg/m   Physical Exam  Gen: Well-appearing Neck: No tender adenopathy Heart: Regular Lungs: Unlabored, clear throughout, no crackles or wheezing, distant sounds Ext: Warm and no edema     Assessment & Plan:   Problem List Items Addressed This Visit       High   Type 2 diabetes mellitus with hyperglycemia (HCC) - Primary (Chronic)   A1c is at 7% on two oral medications. Mounjaro  is not yet approved by insurance. Weight impacts respiratory function. Contacted the pharmacy team for Mounjaro  prior authorization.       Moderate persistent asthma, uncomplicated (Chronic)   The recent exacerbation is linked to lower respiratory tract infection. A CT scan showed mild inflammation without pneumonia. Methylprednisolone  caused tachycardia, so no further steroids are planned. Use a nebulizer every 4 hours as needed for dyspnea or worsening cough. Movement is encouraged to prevent weakness and potential pneumonia, and prolonged bed rest is advised against.  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  At risk for anaphylaxis.  Will refill EpiPens.      Relevant Medications   EPINEPHrine  0.3 mg/0.3 mL IJ SOAJ injection     Unprioritized   LRTI (lower respiratory tract infection)   Symptoms are consistent with a viral infection, likely exacerbating asthma. There is no fever, and a CT scan ruled out bacterial pneumonia. Continue Zyrtec  and  Flonase . Rest and hydration are encouraged.        Cleatus Debby Specking, MD [facility]   "

## 2024-10-20 NOTE — Patient Instructions (Signed)
" °  VISIT SUMMARY: During your visit, we discussed your recent severe asthma exacerbation, which required emergency care. We reviewed your symptoms, recent tests, and your current medications. We also addressed your upper respiratory infection, type 2 diabetes, obesity, and bee and wasp venom allergy .  YOUR PLAN: -ASTHMA WITH ACUTE EXACERBATION: An asthma exacerbation is a worsening of asthma symptoms, often triggered by environmental factors or infections. Your recent exacerbation was linked to poor air quality and missing Zyrtec . The CT scan showed mild inflammation without pneumonia. You should use your nebulizer every 4 to 9 hours as needed for shortness of breath or worsening cough. Movement is encouraged to prevent weakness and potential pneumonia, and prolonged bed rest is advised against.  -ACUTE UPPER RESPIRATORY INFECTION: An upper respiratory infection is a viral infection affecting the nose, throat, and airways. Your symptoms are consistent with this, and it likely worsened your asthma. Continue taking Zyrtec  and Flonase , and ensure you get plenty of rest and stay hydrated.  -TYPE 2 DIABETES MELLITUS: Type 2 diabetes is a condition where your body does not use insulin  properly, leading to high blood sugar levels. Your A1c is at 7% on two oral medications, and Mounjaro  is not approved by your insurance. Weight management is encouraged to improve your respiratory function. We will contact the pharmacy team for Mounjaro  prior authorization.  -OBESITY: Obesity is a condition where excess body fat negatively affects your health. It contributes to respiratory issues and increased lung density on imaging. Weight management is crucial, and we encourage you to adopt strategies to achieve a healthier weight.  -BEE AND WASP VENOM ALLERGY  WITH RISK OF ANAPHYLAXIS: An allergy  to bee and wasp venom can cause a severe allergic reaction called anaphylaxis. Your expired EpiPens posed a risk, but they have been  refilled and are now available in your personal bag and a common home location.  INSTRUCTIONS: Please follow up with us  if your symptoms worsen or if you have any concerns. We will also contact the pharmacy team for Mounjaro  prior authorization.    Contains text generated by [employer].   "

## 2024-10-21 ENCOUNTER — Other Ambulatory Visit (HOSPITAL_COMMUNITY): Payer: Self-pay

## 2024-10-21 ENCOUNTER — Encounter: Payer: Self-pay | Admitting: Student in an Organized Health Care Education/Training Program

## 2024-10-21 DIAGNOSIS — E1165 Type 2 diabetes mellitus with hyperglycemia: Secondary | ICD-10-CM

## 2024-10-24 MED ORDER — TRULICITY 0.75 MG/0.5ML ~~LOC~~ SOAJ: 0.7500 mg | SUBCUTANEOUS | 2 refills | Status: AC

## 2024-10-24 NOTE — Telephone Encounter (Signed)
Patient responded.

## 2024-11-01 ENCOUNTER — Telehealth: Payer: Self-pay

## 2024-11-01 MED ORDER — ESCITALOPRAM OXALATE 20 MG PO TABS: 20.0000 mg | ORAL_TABLET | Freq: Every day | ORAL | 3 refills | Status: AC

## 2024-11-03 ENCOUNTER — Telehealth: Payer: Self-pay

## 2024-11-03 ENCOUNTER — Other Ambulatory Visit (HOSPITAL_COMMUNITY): Payer: Self-pay

## 2024-11-03 NOTE — Telephone Encounter (Signed)
 Pharmacy Patient Advocate Encounter   Received notification from [employer] CMM KEY that prior authorization for Trulicity  0.75MG /0.5ML auto-injectors is required/requested.   Insurance verification completed.   The patient is insured through [facility] MEDICAID.   Per test claim: PA required; PA submitted to above mentioned insurance via Latent Key/confirmation #/EOC [id number] Status is pending

## 2024-11-03 NOTE — Telephone Encounter (Signed)
 Patient needs PA on Trulicity 

## 2024-11-04 ENCOUNTER — Other Ambulatory Visit (HOSPITAL_COMMUNITY): Payer: Self-pay

## 2024-11-04 NOTE — Telephone Encounter (Signed)
 Pharmacy Patient Advocate Encounter  Received notification from [employer] that Prior Authorization for Trulicity  0.75MG /0.5ML auto-injectors  has been APPROVED from 11/03/24 to 11/03/25   PA #/Case ID/Reference #: [id number]

## 2024-12-06 ENCOUNTER — Ambulatory Visit: Admitting: Student in an Organized Health Care Education/Training Program
